# Patient Record
Sex: Female | Born: 1940
Health system: Southern US, Community
[De-identification: ages and names within clinical notes are randomized; demographics above are authoritative.]

## PROBLEM LIST (undated history)

## (undated) DIAGNOSIS — I351 Nonrheumatic aortic (valve) insufficiency: Secondary | ICD-10-CM

## (undated) DIAGNOSIS — N179 Acute kidney failure, unspecified: Secondary | ICD-10-CM

## (undated) DIAGNOSIS — E039 Hypothyroidism, unspecified: Secondary | ICD-10-CM

## (undated) DIAGNOSIS — I517 Cardiomegaly: Secondary | ICD-10-CM

## (undated) DIAGNOSIS — Z8616 Personal history of COVID-19: Secondary | ICD-10-CM

## (undated) DIAGNOSIS — D649 Anemia, unspecified: Secondary | ICD-10-CM

## (undated) DIAGNOSIS — M1990 Unspecified osteoarthritis, unspecified site: Secondary | ICD-10-CM

## (undated) DIAGNOSIS — I4891 Unspecified atrial fibrillation: Secondary | ICD-10-CM

## (undated) DIAGNOSIS — I428 Other cardiomyopathies: Secondary | ICD-10-CM

## (undated) DIAGNOSIS — N186 End stage renal disease: Secondary | ICD-10-CM

## (undated) DIAGNOSIS — I447 Left bundle-branch block, unspecified: Secondary | ICD-10-CM

## (undated) DIAGNOSIS — F419 Anxiety disorder, unspecified: Secondary | ICD-10-CM

## (undated) DIAGNOSIS — I422 Other hypertrophic cardiomyopathy: Secondary | ICD-10-CM

## (undated) DIAGNOSIS — R001 Bradycardia, unspecified: Secondary | ICD-10-CM

## (undated) DIAGNOSIS — Z950 Presence of cardiac pacemaker: Secondary | ICD-10-CM

## (undated) DIAGNOSIS — I10 Essential (primary) hypertension: Secondary | ICD-10-CM

## (undated) DIAGNOSIS — I340 Nonrheumatic mitral (valve) insufficiency: Secondary | ICD-10-CM

## (undated) DIAGNOSIS — S301XXA Contusion of abdominal wall, initial encounter: Secondary | ICD-10-CM

## (undated) DIAGNOSIS — I701 Atherosclerosis of renal artery: Secondary | ICD-10-CM

## (undated) DIAGNOSIS — IMO0002 Reserved for concepts with insufficient information to code with codable children: Secondary | ICD-10-CM

## (undated) DIAGNOSIS — I2510 Atherosclerotic heart disease of native coronary artery without angina pectoris: Secondary | ICD-10-CM

## (undated) DIAGNOSIS — I509 Heart failure, unspecified: Secondary | ICD-10-CM

## (undated) DIAGNOSIS — M3214 Glomerular disease in systemic lupus erythematosus: Secondary | ICD-10-CM

## (undated) DIAGNOSIS — G4733 Obstructive sleep apnea (adult) (pediatric): Secondary | ICD-10-CM

## (undated) DIAGNOSIS — R9430 Abnormal result of cardiovascular function study, unspecified: Secondary | ICD-10-CM

## (undated) HISTORY — DX: Acute kidney failure, unspecified: N17.9

## (undated) HISTORY — DX: Atherosclerotic heart disease of native coronary artery without angina pectoris: I25.10

## (undated) HISTORY — DX: Contusion of abdominal wall, initial encounter: S30.1XXA

## (undated) HISTORY — DX: Atherosclerosis of renal artery: I70.1

## (undated) HISTORY — DX: Other hypertrophic cardiomyopathy: I42.2

## (undated) HISTORY — DX: Anemia, unspecified: D64.9

## (undated) HISTORY — PX: REPLACEMENT TOTAL HIP W/  RESURFACING IMPLANTS: SUR1222

## (undated) HISTORY — PX: OTHER SURGICAL HISTORY: SHX169

## (undated) HISTORY — DX: Nonrheumatic aortic (valve) insufficiency: I35.1

## (undated) HISTORY — DX: Bradycardia, unspecified: R00.1

## (undated) HISTORY — PX: REPLACEMENT TOTAL KNEE: SUR1224

## (undated) HISTORY — DX: Unspecified osteoarthritis, unspecified site: M19.90

## (undated) HISTORY — DX: Obstructive sleep apnea (adult) (pediatric): G47.33

## (undated) HISTORY — DX: Left bundle-branch block, unspecified: I44.7

## (undated) HISTORY — DX: Abnormal result of cardiovascular function study, unspecified: R94.30

## (undated) HISTORY — DX: Cardiomegaly: I51.7

## (undated) HISTORY — DX: Unspecified atrial fibrillation: I48.91

## (undated) HISTORY — PX: ABDOMINAL HYSTERECTOMY: SHX81

## (undated) HISTORY — DX: Reserved for concepts with insufficient information to code with codable children: IMO0002

## (undated) HISTORY — DX: Other cardiomyopathies: I42.8

## (undated) HISTORY — DX: Essential (primary) hypertension: I10

## (undated) HISTORY — DX: Nonrheumatic mitral (valve) insufficiency: I34.0

---

## 2008-02-11 ENCOUNTER — Ambulatory Visit: Payer: Self-pay | Admitting: Cardiology

## 2008-02-16 ENCOUNTER — Ambulatory Visit: Payer: Self-pay | Admitting: Cardiology

## 2008-02-16 ENCOUNTER — Inpatient Hospital Stay (HOSPITAL_COMMUNITY): Admission: RE | Admit: 2008-02-16 | Discharge: 2008-02-23 | Payer: Self-pay | Admitting: Cardiology

## 2008-02-18 ENCOUNTER — Ambulatory Visit: Payer: Self-pay | Admitting: Vascular Surgery

## 2008-02-19 ENCOUNTER — Encounter: Payer: Self-pay | Admitting: Cardiology

## 2008-02-23 ENCOUNTER — Encounter: Payer: Self-pay | Admitting: Cardiology

## 2008-03-11 ENCOUNTER — Ambulatory Visit: Payer: Self-pay | Admitting: Cardiology

## 2008-03-15 ENCOUNTER — Ambulatory Visit: Payer: Self-pay | Admitting: Vascular Surgery

## 2008-03-23 ENCOUNTER — Ambulatory Visit: Payer: Self-pay | Admitting: Vascular Surgery

## 2008-03-23 ENCOUNTER — Ambulatory Visit: Payer: Self-pay | Admitting: Cardiology

## 2008-03-25 ENCOUNTER — Ambulatory Visit: Payer: Self-pay | Admitting: Vascular Surgery

## 2008-03-25 ENCOUNTER — Inpatient Hospital Stay (HOSPITAL_COMMUNITY): Admission: RE | Admit: 2008-03-25 | Discharge: 2008-03-28 | Payer: Self-pay | Admitting: Vascular Surgery

## 2008-04-15 ENCOUNTER — Ambulatory Visit: Payer: Self-pay | Admitting: Cardiology

## 2008-04-19 ENCOUNTER — Ambulatory Visit: Payer: Self-pay | Admitting: Vascular Surgery

## 2008-04-22 ENCOUNTER — Ambulatory Visit: Payer: Self-pay | Admitting: Physician Assistant

## 2008-04-29 ENCOUNTER — Ambulatory Visit: Payer: Self-pay | Admitting: Cardiology

## 2008-04-29 ENCOUNTER — Encounter: Payer: Self-pay | Admitting: Cardiology

## 2008-04-30 ENCOUNTER — Encounter: Payer: Self-pay | Admitting: Cardiology

## 2008-05-03 ENCOUNTER — Encounter: Payer: Self-pay | Admitting: Cardiology

## 2008-05-11 ENCOUNTER — Ambulatory Visit: Payer: Self-pay | Admitting: Cardiology

## 2008-05-11 ENCOUNTER — Encounter: Payer: Self-pay | Admitting: Physician Assistant

## 2008-05-31 ENCOUNTER — Ambulatory Visit: Payer: Self-pay | Admitting: Vascular Surgery

## 2008-06-24 ENCOUNTER — Ambulatory Visit: Payer: Self-pay | Admitting: Cardiology

## 2008-08-17 ENCOUNTER — Ambulatory Visit: Payer: Self-pay | Admitting: Cardiology

## 2008-08-17 ENCOUNTER — Encounter: Payer: Self-pay | Admitting: Physician Assistant

## 2008-08-25 ENCOUNTER — Encounter: Payer: Self-pay | Admitting: Cardiology

## 2008-08-25 ENCOUNTER — Ambulatory Visit: Payer: Self-pay | Admitting: Cardiology

## 2008-11-21 ENCOUNTER — Encounter: Payer: Self-pay | Admitting: Cardiology

## 2008-11-21 DIAGNOSIS — I351 Nonrheumatic aortic (valve) insufficiency: Secondary | ICD-10-CM | POA: Insufficient documentation

## 2008-11-22 ENCOUNTER — Ambulatory Visit: Payer: Self-pay | Admitting: Cardiology

## 2008-12-02 ENCOUNTER — Encounter: Payer: Self-pay | Admitting: Cardiology

## 2008-12-12 ENCOUNTER — Encounter (INDEPENDENT_AMBULATORY_CARE_PROVIDER_SITE_OTHER): Payer: Self-pay | Admitting: *Deleted

## 2008-12-20 ENCOUNTER — Ambulatory Visit: Payer: Self-pay | Admitting: Internal Medicine

## 2009-01-05 ENCOUNTER — Encounter: Payer: Self-pay | Admitting: Cardiology

## 2009-01-06 ENCOUNTER — Ambulatory Visit: Payer: Self-pay | Admitting: Cardiology

## 2009-01-17 ENCOUNTER — Telehealth: Payer: Self-pay | Admitting: Cardiology

## 2009-02-15 ENCOUNTER — Encounter: Payer: Self-pay | Admitting: Cardiology

## 2009-02-16 ENCOUNTER — Encounter: Payer: Self-pay | Admitting: Cardiology

## 2009-02-19 ENCOUNTER — Encounter: Payer: Self-pay | Admitting: Cardiology

## 2009-02-20 ENCOUNTER — Ambulatory Visit: Payer: Self-pay | Admitting: Cardiology

## 2009-03-31 ENCOUNTER — Ambulatory Visit: Payer: Self-pay | Admitting: Cardiology

## 2009-04-07 ENCOUNTER — Telehealth (INDEPENDENT_AMBULATORY_CARE_PROVIDER_SITE_OTHER): Payer: Self-pay | Admitting: *Deleted

## 2009-04-11 ENCOUNTER — Ambulatory Visit: Payer: Self-pay | Admitting: Cardiology

## 2009-04-11 ENCOUNTER — Encounter: Payer: Self-pay | Admitting: Cardiology

## 2009-04-18 ENCOUNTER — Ambulatory Visit: Payer: Self-pay | Admitting: Internal Medicine

## 2009-04-23 ENCOUNTER — Encounter: Payer: Self-pay | Admitting: Cardiology

## 2009-04-25 ENCOUNTER — Encounter: Payer: Self-pay | Admitting: Cardiology

## 2009-05-09 ENCOUNTER — Encounter: Payer: Self-pay | Admitting: Cardiology

## 2009-05-22 ENCOUNTER — Ambulatory Visit: Payer: Self-pay | Admitting: Cardiology

## 2009-09-07 ENCOUNTER — Ambulatory Visit: Payer: Self-pay | Admitting: Cardiology

## 2009-11-02 ENCOUNTER — Ambulatory Visit: Payer: Self-pay | Admitting: Internal Medicine

## 2009-11-15 ENCOUNTER — Telehealth (INDEPENDENT_AMBULATORY_CARE_PROVIDER_SITE_OTHER): Payer: Self-pay | Admitting: *Deleted

## 2009-11-16 ENCOUNTER — Encounter: Payer: Self-pay | Admitting: Internal Medicine

## 2009-12-04 ENCOUNTER — Encounter (INDEPENDENT_AMBULATORY_CARE_PROVIDER_SITE_OTHER): Payer: Self-pay | Admitting: *Deleted

## 2009-12-04 ENCOUNTER — Encounter: Payer: Self-pay | Admitting: Internal Medicine

## 2009-12-05 ENCOUNTER — Telehealth (INDEPENDENT_AMBULATORY_CARE_PROVIDER_SITE_OTHER): Payer: Self-pay | Admitting: *Deleted

## 2009-12-08 ENCOUNTER — Encounter: Payer: Self-pay | Admitting: Cardiology

## 2009-12-08 ENCOUNTER — Ambulatory Visit: Payer: Self-pay | Admitting: Cardiology

## 2009-12-09 ENCOUNTER — Encounter: Payer: Self-pay | Admitting: Cardiology

## 2009-12-11 ENCOUNTER — Telehealth: Payer: Self-pay | Admitting: Cardiology

## 2009-12-12 ENCOUNTER — Encounter: Payer: Self-pay | Admitting: Cardiology

## 2010-01-01 ENCOUNTER — Encounter: Payer: Self-pay | Admitting: Cardiology

## 2010-01-09 ENCOUNTER — Telehealth: Payer: Self-pay | Admitting: Internal Medicine

## 2010-01-17 ENCOUNTER — Encounter: Payer: Self-pay | Admitting: Cardiology

## 2010-01-26 ENCOUNTER — Encounter: Payer: Self-pay | Admitting: Cardiology

## 2010-01-27 ENCOUNTER — Encounter: Payer: Self-pay | Admitting: Cardiology

## 2010-01-28 ENCOUNTER — Encounter: Payer: Self-pay | Admitting: Cardiology

## 2010-01-29 ENCOUNTER — Ambulatory Visit: Payer: Self-pay | Admitting: Cardiology

## 2010-01-31 ENCOUNTER — Encounter: Payer: Self-pay | Admitting: Cardiology

## 2010-02-01 ENCOUNTER — Encounter: Payer: Self-pay | Admitting: Cardiology

## 2010-02-05 ENCOUNTER — Telehealth (INDEPENDENT_AMBULATORY_CARE_PROVIDER_SITE_OTHER): Payer: Self-pay | Admitting: *Deleted

## 2010-02-07 ENCOUNTER — Encounter: Payer: Self-pay | Admitting: Cardiology

## 2010-02-07 ENCOUNTER — Ambulatory Visit: Payer: Self-pay | Admitting: Cardiology

## 2010-02-08 ENCOUNTER — Encounter (INDEPENDENT_AMBULATORY_CARE_PROVIDER_SITE_OTHER): Payer: Self-pay | Admitting: Cardiology

## 2010-02-13 ENCOUNTER — Telehealth: Payer: Self-pay | Admitting: Nurse Practitioner

## 2010-02-14 ENCOUNTER — Ambulatory Visit: Payer: Self-pay | Admitting: Cardiology

## 2010-02-14 ENCOUNTER — Encounter: Payer: Self-pay | Admitting: Cardiology

## 2010-02-14 DIAGNOSIS — E876 Hypokalemia: Secondary | ICD-10-CM | POA: Insufficient documentation

## 2010-02-19 ENCOUNTER — Encounter: Payer: Self-pay | Admitting: Cardiology

## 2010-04-03 NOTE — Miscellaneous (Signed)
  Clinical Lists Changes  Observations: Added new observation of PAST MED HX: nonischemic cardiomyopathy...EF 25%...nondiagnostic MRI December 2009...  /  echo June, 2010 Septal hypertrophy....echo....04/2009....,.decide if family to be screened PLanned ICD / CRT... patient has seen Dr.Klein... this will be placed when state insurance issues are in order LVH.. moderately severe... echo.. June, [id number] systolic heart failure...chronic Hypertension Mitral regurgitation...mild/ moderate...echo June, 2010 Aortic insufficiency mild...echo... June 2 010 80% upper left renal artery stenosis. right groin hematoma/abscess..repaired.. December, 2009 Anemia LBBB Obstructive sleep apnea arthritis of hips and knees...severe... requiring a walker Bradycardia    (05/09/2009 10:12) Added new observation of PRIMARY MD: Jerene Bears, MD (05/09/2009 10:12)       Past History:  Past Medical History: nonischemic cardiomyopathy...EF 25%...nondiagnostic MRI December 2009...  /  echo June, 2010 Septal hypertrophy....echo....04/2009....,.decide if family to be screened PLanned ICD / CRT... patient has seen Dr.Klein... this will be placed when state insurance issues are in order LVH.. moderately severe... echo.. June, [id number] systolic heart failure...chronic Hypertension Mitral regurgitation...mild/ moderate...echo June, 2010 Aortic insufficiency mild...echo... June 2 010 80% upper left renal artery stenosis. right groin hematoma/abscess..repaired.. December, 2009 Anemia LBBB Obstructive sleep apnea arthritis of hips and knees...severe... requiring a walker Bradycardia

## 2010-04-03 NOTE — Assessment & Plan Note (Signed)
Summary: 6wk f/u [place]  Medications Added CALCIUM CARBONATE-VITAMIN D 600-400 MG-UNIT  TABS (CALCIUM CARBONATE-VITAMIN D) once daily      Allergies Added:   Visit Type:  Follow-up Primary Provider:  Jerene Bears, MD  CC:  cardiomyopathy.  History of Present Illness: The patient is seen for followup of cardiomyopathy.  She has seen Dr.Klein.  ICD is recommended.  We are hoping to get this approved soon.  Her echo did show significant LVH.  She also has marked dilatation of the base of her septum.  The exact etiology of her cardiomyopathy is unknown.  Her blood pressure is elevated here today.  She is on multiple medications.  She will be checking it at home to see if we need to push her meds further.  Current Medications (verified): 1)  Lisinopril 40 Mg Tabs (Lisinopril) .... Take 1 Tablet By Mouth Two Times A Day 2)  Isosorbide Mononitrate Cr 60 Mg Xr24h-Tab (Isosorbide Mononitrate) .... Take One Tablet By Mouth Daily 3)  Clonidine Hcl 0.1 Mg Tabs (Clonidine Hcl) .... Take One Tablet By Mouth Three Times A Day 4)  Furosemide 80 Mg Tabs (Furosemide) .... Take 1 Tablet By Mouth Once A Day and As Needed For Swelling 5)  Hydralazine Hcl 50 Mg Tabs (Hydralazine Hcl) .... Take One Tablet By Mouth Three Times A Day 6)  Carvedilol 6.25 Mg Tabs (Carvedilol) .... Take 1 Tablet By Mouth Two Times A Day 7)  Potassium Chloride Crys Cr 20 Meq Cr-Tabs (Potassium Chloride Crys Cr) .... Take One Tablet By Mouth Daily 8)  Spironolactone 25 Mg Tabs (Spironolactone) .... Take One Tablet By Mouth Daily 9)  Aspirin 81 Mg Tbec (Aspirin) .... Take One Tablet By Mouth Daily 10)  Ferrous Sulfate 325 (65 Fe) Mg  Tabs (Ferrous Sulfate) .... Once Daily 11)  Vitamin E 400 Unit Caps (Vitamin E) .... Take 1 Tablet By Mouth Once A Day 12)  Vitamin C 500 Mg  Tabs (Ascorbic Acid) .... Once Daily 13)  Glucosamine-Chondroitin   Caps (Glucosamine-Chondroit-Vit C-Mn) .... Once Daily 14)  Ramipril 2.5 Mg Caps (Ramipril) .... Take  One Capsule By Mouth Daily 15)  Calcium Carbonate-Vitamin D 600-400 Mg-Unit  Tabs (Calcium Carbonate-Vitamin D) .... Once Daily  Allergies (verified): 1)  Keflex  Past History:  Past Medical History: Last updated: 05/09/2009 nonischemic cardiomyopathy...EF 25%...nondiagnostic MRI December 2009...  /  echo June, 2010 Septal hypertrophy....echo....04/2009....,.decide if family to be screened PLanned ICD / CRT... patient has seen Dr.Klein... this will be placed when state insurance issues are in order LVH.. moderately severe... echo.. June, [id number] systolic heart failure...chronic Hypertension Mitral regurgitation...mild/ moderate...echo June, 2010 Aortic insufficiency mild...echo... June 2 010 80% upper left renal artery stenosis. right groin hematoma/abscess..repaired.. December, 2009 Anemia LBBB Obstructive sleep apnea arthritis of hips and knees...severe... requiring a walker Bradycardia    Review of Systems       Patient denies fever, chills, headache, sweats, rash, change in vision, change in hearing, chest pain, cough, shortness of breath, nausea or vomiting, urinary symptoms.  She had an upper respiratory illness recently but she is improved.  All other systems are reviewed and are negative  Vital Signs:  Patient profile:   70 year old female Height:      63 inches Weight:      152 pounds BMI:     27.02 Pulse rate:   60 / minute BP sitting:   152 / 76  (left arm) Cuff size:   regular  Vitals Entered By: Venida Jarvis  Mare Ferrari, [place] (May 22, 2009 3:34 PM)  Physical Exam  General:  patient is stable today. Eyes:  no xanthelasma. Neck:  no jugular venous distention. Lungs:  lungs are clear.  Respiratory effort is nonlabored. Heart:  cardiac exam reveals S1 and S2.  No clicks or significant murmurs. Abdomen:  abdomen is soft. Extremities:  no peripheral edema. Psych:  patient is oriented to person time and place.  Affect is normal.   Impression &  Recommendations:  Problem # 1:  * SEPTAL HYPERTROPHY Patient has significant septal hypertrophy.  The etiology of her cardiomyopathy is unknown.  I have spoken with her about screening family members.  She has no children.  She does have 2 brothers who are in their 79s.  She will send information to the letting them know that she has a hypertrophic cardiomyopathy.  She will recommend to them that they inform their doctor's to see if their  doctors want to proceed with echocardiography.  Problem # 2:  CHRONIC SYSTOLIC HEART FAILURE (123456) CHF is under control at this time.  No change in her meds today.  Problem # 3:  HYPERTENSION (ICD-401.9) I am concerned about her continued hypertension.  She is on many medications.  I will not adjust her meds until we have more information from her home blood pressures.  Patient Instructions: 1)  Your physician wants you to follow-up in: 3 months. You will receive a reminder letter in the mail one-two months in advance. If you don't receive a letter, please call our office to schedule the follow-up appointment. 2)  Your physician recommends that you continue on your current medications as directed. Please refer to the Current Medication list given to you today.

## 2010-04-03 NOTE — Letter (Signed)
Summary: [facility] D/C DR. DHRUV VYAS  [facility] D/C DR. DHRUV VYAS   Imported By: Delfino Lovett 01/26/2010 10:17:07  _____________________________________________________________________  External Attachment:    Type:   Image     Comment:   External Document

## 2010-04-03 NOTE — Assessment & Plan Note (Signed)
Summary: eph-post [place] [facility] 10/31  Medications Added CLONIDINE HCL 0.2 MG TABS (CLONIDINE HCL) Take 1 tablet by mouth three times a day FUROSEMIDE 40 MG TABS (FUROSEMIDE) Take 1 tablet by mouth two times a day FUROSEMIDE 40 MG TABS (FUROSEMIDE) Take 2 tablets every morning and 1 tablet every evening as directed CARVEDILOL 3.125 MG TABS (CARVEDILOL) Take 1 tablet by mouth two times a day POTASSIUM CHLORIDE CRYS CR 20 MEQ CR-TABS (POTASSIUM CHLORIDE CRYS CR) Take one tablet by mouth two times a day * IRON 65MG  Take 1 tablet by mouth once a day      Allergies Added:   Visit Type:  Follow-up Primary Provider:  Jerene Bears, MD  CC:  CHF.  History of Present Illness: The patient is seen for cardiology followup.  We have been working diligently to finalize the plans for ICD placement.  The patient was hospitalized at [facility] December 08, 2009.  She had diarrhea.  It is my understanding that she was anemic and that ultimately a colonoscopy was done.  Her anemia will need other workup.  Because of her diarrhea she was dehydrated.  As her renal function improved she was kept off her diuretics.  All of these decisions were appropriate but unfortunately the patient later began to gain fluid weight.  She in fact was admitted to [facility] with fluid overload at the end of October and recently in November.  At the last admission she was intubated briefly.  She is stabilizing.  She continues to diuresis home.  The patient's ejection fraction in late October and [place] was 30%.  This is very unusual as her ejection fraction was 55-60% on December 08, 2009.  There was no myocardial infarction in the interim.  Also the patient was markedly hypertensive when she was admitted with congestive heart failure and [place].  There is a history of renal artery stenosis.  However it affects only the upper artery of the left kidney.  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  Preventive  Screening-Counseling & Management  Alcohol-Tobacco     Smoking Status: quit     Year Started: 20  yrs     Year Quit: 1987  Current Medications (verified): 1)  Isosorbide Mononitrate Cr 60 Mg Xr24h-Tab (Isosorbide Mononitrate) .... Take One Tablet By Mouth Daily 2)  Clonidine Hcl 0.2 Mg Tabs (Clonidine Hcl) .... Take 1 Tablet By Mouth Three Times A Day 3)  Furosemide 40 Mg Tabs (Furosemide) .... Take 1 Tablet By Mouth Two Times A Day 4)  Hydralazine Hcl 50 Mg Tabs (Hydralazine Hcl) .... Take One Tablet By Mouth Three Times A Day 5)  Carvedilol 3.125 Mg Tabs (Carvedilol) .... Take 1 Tablet By Mouth Two Times A Day 6)  Potassium Chloride Crys Cr 20 Meq Cr-Tabs (Potassium Chloride Crys Cr) .... Take One Tablet By Mouth Daily 7)  Aspirin 81 Mg Tbec (Aspirin) .... Take One Tablet By Mouth Daily 8)  Iron 65mg  .... Take 1 Tablet By Mouth Once A Day 9)  Vitamin E 400 Unit Caps (Vitamin E) .... Take 1 Tablet By Mouth Once A Day 10)  Vitamin C 500 Mg  Tabs (Ascorbic Acid) .... Once Daily 11)  Glucosamine-Chondroitin 750-600 Mg Tabs (Glucosamine-Chondroitin) .... Take 1 Tablet By Mouth Two Times A Day 12)  Ramipril 2.5 Mg Caps (Ramipril) .... Take One Capsule By Mouth Daily 13)  Calcium Carbonate-Vitamin D 600-400 Mg-Unit  Tabs (Calcium Carbonate-Vitamin D) .... Take 1 Tablet By Mouth Two Times A Day 14)  Alendronate Sodium 70 Mg Tabs (Alendronate Sodium) .... Take One By Mouth Weekly  Allergies (verified): 1)  Keflex  Past History:  Past Medical History: .EF 25%...nondiagnostic MRI December 2009...  /  echo June, 2010  /   echo... December 08, 2009.... ejection fraction improved... EF 60% /  EF 30%... echo.... [place]... December 28, 2009 with CHF Cardiomyopathy nonischemic.... EF improved October, 2011 Septal hypertrophy....echo....04/2009....patient encouraged the family to be screened elsewhere PLanned ICD / CRT... patient has seen Dr.Klein..EF improved October, 2011 LVH.. moderately  severe... echo.. June, 2010  /  EF improved October, 2011.... cancel plans for ICD systolic heart failure...chronic Hypertension Mitral regurgitation...mild/ moderate...echo June, 2010  /   echo.. October, 2011.... mitral regurgitation improved Aortic insufficiency mild...echo... June 2 010 80% upper left renal artery stenosis. right groin hematoma/abscess..repaired.. December, 2009 Anemia   further workup needed... October, 2011 LBBB Obstructive sleep apnea arthritis of hips and knees...severe... requiring a walker Bradycardia Acute renal failure   hospitalized... December 08, 2009... improved with hydration in the hospital  ??? atrial fibrillation during hospitalization October, 2011 ???   SH/Risk Factors reviewed for relevance  Review of Systems       Patient denies fever, chills, headache, sweats, rash, change in vision, change in hearing, chest pain, cough, nausea vomiting, urinary symptoms.  All other systems are reviewed and are negative.  Vital Signs:  Patient profile:   70 year old female Height:      63 inches Weight:      171.25 pounds Pulse rate:   64 / minute BP sitting:   147 / 76  (left arm) Cuff size:   regular  Vitals Entered By: Lovina Reach, LPN (November 28, 624THL 1:18 PM) CC: CHF Is Patient Diabetic? No Comments POST HOSP.   Physical Exam  General:  patient is stable today. Head:  head is atraumatic. Eyes:  patient has puffiness in her eyelids.  In her case this is compatible with increased volume. Neck:  no drug venous distention. Chest Wall:  no chest wall tenderness. Lungs:  lungs are clear respiratory effort is nonlabored. Heart:  cardiac exam reveals S1-S2.  There no clicks or significant murmurs. Abdomen:  abdomen is soft.  She says it does feel a little full. Msk:  no musculoskeletal deformities. Extremities:  trace peripheral edema. Skin:  no skin rashes. Psych:  patient is oriented to person time and place.  Affect is normal.   Impression &  Recommendations:  Problem # 1:  * ACUTE RENAL FAILURE  OCTOBER, 2011 This occurred when the patient was dehydrated in early October.  It is resolved.  Chemistry will be checked.  Problem # 2:  CARDIOMYOPATHY, PRIMARY, EF 25% (ICD-425.4) The patient's changing ejection fraction has been very confusing.  We know that her EF was at least 55% on December 08 2009.  It is reported as 30% on December 28, 2009.  I will repeat review our echoes.  I do not have access to the echo from [place] in October 27, 20113.   Problem # 3:  CHRONIC SYSTOLIC HEART FAILURE (123456) The patient recently became dehydrated in early October.  She had renal dysfunction with this.  She was rehydrated and later became volume overloaded.  She has now had 2 episodes of congestive heart failure with volume overload.  Her dry weight at home had been as low as 153 pounds.  It was as high as 175 pounds recently.  Currently it is in the range of 165.  We will  continue to watch her diuresis carefully until she gets into the range of 158 and 159.  Potassium will be watched carefully.  She will receive 80 mg a day of Lasix in the morning and take an extra 40 mg later in the day if her weight is not decreasing.  She will receive extra 40 mEq of potassium today.  Chemistry will be checked today and this is made about further potassium dosing based on this lab.  In the meantime she will take 20 mEq of potassium b.i.d.  Problem # 4:  RENAL ARTERY STENOSIS (ICD-440.1) The patient has renal artery stenosis but it is 2 one of 2 arteries to her left kidney.  It seems unlikely that she is having flash pulmonary edema based on renal artery stenosis.  It is more likely that these episodes are from true total body volume overload.  Problem # 5:  HYPERTENSION (ICD-401.9) Blood pressure is controlled today.  No change in therapy.  Problem # 6:  * PLAN FOR ICD We were very close to proceeding with ICD placement.  She had her episode of  dehydration and renal dysfunction.  At that time it appeared that her LV function was good.  Combination of these events led to cancellation of her ICD placement.  His whole issue will be rereviewed as she stabilizes.  Problem # 7:  * ??  ATRIAL FIBRILLATION  ?? There is question that the patient had some atrial fibrillation during her hospitalization in October, 2011.  There is also question that she went home on Coumadin.  I cannot document this.  We will reassess her rhythm at the next visit and make further decisions.  Other Orders: T-Basic Metabolic Panel ([id number])  Patient Instructions: 1)  Follow up with Dr. Ron Parker on Wed, Feb 07, 2010 at 2pm 2)  Take an additional 40mg  of Lasix (furosemide) today. Then take 2 tablets (80mg ) every morning and 1 tablet (40mg ) every evening (if needed). 3)  Take an extra 40 mEq (2 tablets) of Potassium today then take 20 mEq (1 tablet) two times a day. 4)  Your physician recommends that you go to the [facility] for lab work: DO TODAY. Prescriptions: POTASSIUM CHLORIDE CRYS CR 20 MEQ CR-TABS (POTASSIUM CHLORIDE CRYS CR) Take one tablet by mouth two times a day  #60 x 6   Entered by:   Gurney Maxin, RN, BSN   Authorized by:   Lorenza Evangelist, MD, [facility]   Signed by:   Gurney Maxin, RN, BSN on 01/29/2010   Method used:   Electronically to        [facility]. [address] (retail)       [address] [place]  [address]       Ph: [phone] or [phone]       Fax: [phone]   RxID:   [id number] FUROSEMIDE 40 MG TABS (FUROSEMIDE) Take 2 tablets every morning and 1 tablet every evening as directed  #90 x 6   Entered by:   Gurney Maxin, RN, BSN   Authorized by:   Lorenza Evangelist, MD, [facility]   Signed by:   Gurney Maxin, RN, BSN on 01/29/2010   Method used:   Electronically to        [facility]. [address]* (retail)       [address] [place]  [address]       Ph: [phone] or  [phone]        Fax: [phone]   RxIDLL:[id number]

## 2010-04-03 NOTE — Letter (Signed)
Summary: Discharge [facility]  Discharge Summary/ [place] By: Bartholomew Boards 02/05/2010 11:17:01  _____________________________________________________________________  External Attachment:    Type:   Image     Comment:   External Document

## 2010-04-03 NOTE — Miscellaneous (Signed)
  Clinical Lists Changes  Problems: Added new problem of * SEPTAL HYPERTROPHY Observations: Added new observation of PAST MED HX: nonischemic cardiomyopathy...EF 25%...nondiagnostic MRI December 2009...  /  echo June, 2010 Septal hypertrophy....echo..decide if family to be screened PLanned ICD / CRT... patient has seen Dr.Klein... this will be placed when state insurance issues are in order LVH.. moderately severe... echo.. June, [id number] systolic heart failure...chronic Hypertension Mitral regurgitation...mild/ moderate...echo June, 2010 Aortic insufficiency mild...echo... June 2 010 80% upper left renal artery stenosis. right groin hematoma/abscess..repaired.. December, 2009 Anemia LBBB Obstructive sleep apnea arthritis of hips and knees...severe... requiring a walker Bradycardia    (05/09/2009 10:09) Added new observation of PRIMARY MD: Jerene Bears, MD (05/09/2009 10:09)       Past History:  Past Medical History: nonischemic cardiomyopathy...EF 25%...nondiagnostic MRI December 2009...  /  echo June, 2010 Septal hypertrophy....echo..decide if family to be screened PLanned ICD / CRT... patient has seen Dr.Klein... this will be placed when state insurance issues are in order LVH.. moderately severe... echo.. June, [id number] systolic heart failure...chronic Hypertension Mitral regurgitation...mild/ moderate...echo June, 2010 Aortic insufficiency mild...echo... June 2 010 80% upper left renal artery stenosis. right groin hematoma/abscess..repaired.. December, 2009 Anemia LBBB Obstructive sleep apnea arthritis of hips and knees...severe... requiring a walker Bradycardia

## 2010-04-03 NOTE — Assessment & Plan Note (Signed)
Summary: 1 mo fu -srs  Medications Added CARVEDILOL 6.25 MG TABS (CARVEDILOL) Take 1 tablet by mouth two times a day        Visit Type:  Follow-up Primary Provider:  Jerene Bears, MD  CC:  cardiac myopathy.  History of Present Illness: The patient is seen for followup of cardiomyopathy.  I saw her last February 20, 2009.  She is feeling well.  She has been extremely motivated and she has lost more than 100 pounds.  She looks fantastic.  She's not having chest pain or shortness of breath.  She is taking all medications as recommended for her cardiomyopathy.  She has significant hypertension.  She is on many medications.  She is on an unusual combination of 2 ACE inhibitors.  This will have to be reviewed but are not inclined to change this today.  First we need chemistry data to be sure that her renal function and potassium are stable.  As outlined in my prior notes we are planning for an ICD placement.  She will see Dr. Caryl Comes April 18, 2008.  Since it has been since June, 2010, we will obtain another 2-D echo to be sure that there has not been an unexpected change in her LV function since that time.    Preventive Screening-Counseling & Management  Alcohol-Tobacco     Smoking Status: quit > 6 months     Year Started: 1962     Year Quit: 1987  Current Medications (verified): 1)  Lisinopril 40 Mg Tabs (Lisinopril) .... Take 1 Tablet By Mouth Two Times A Day 2)  Isosorbide Mononitrate Cr 60 Mg Xr24h-Tab (Isosorbide Mononitrate) .... Take One Tablet By Mouth Daily 3)  Clonidine Hcl 0.1 Mg Tabs (Clonidine Hcl) .... Take One Tablet By Mouth Three Times A Day 4)  Furosemide 80 Mg Tabs (Furosemide) .... Take 1 Tablet By Mouth Once A Day and As Needed For Swelling 5)  Hydralazine Hcl 50 Mg Tabs (Hydralazine Hcl) .... Take One Tablet By Mouth Three Times A Day 6)  Carvedilol 6.25 Mg Tabs (Carvedilol) .... Take 1 Tablet By Mouth Two Times A Day 7)  Potassium Chloride Crys Cr 20 Meq Cr-Tabs  (Potassium Chloride Crys Cr) .... Take One Tablet By Mouth Daily 8)  Spironolactone 25 Mg Tabs (Spironolactone) .... Take One Tablet By Mouth Daily 9)  Aspirin 81 Mg Tbec (Aspirin) .... Take One Tablet By Mouth Daily 10)  Ferrous Sulfate 325 (65 Fe) Mg  Tabs (Ferrous Sulfate) .... Once Daily 11)  Vitamin E 400 Unit Caps (Vitamin E) .... Take 1 Tablet By Mouth Once A Day 12)  Vitamin C 500 Mg  Tabs (Ascorbic Acid) .... Once Daily 13)  Glucosamine-Chondroitin   Caps (Glucosamine-Chondroit-Vit C-Mn) .... Once Daily 14)  Ramipril 2.5 Mg Caps (Ramipril) .... Take One Capsule By Mouth Daily  Allergies: 1)  Keflex  Comments:  Nurse/Medical Assistant: The patient's medications were reviewed with the patient and were updated in the Medication List. Pt brought a list of medications to office visit.  Gurney Maxin, RN, BSN (March 31, 2009 2:27 PM)  Past History:  Past Medical History: Last updated: 02/19/2009 nonischemic cardiomyopathy...EF 25%...nondiagnostic MRI December 2009...  /  echo June, 2010 PLanned ICD / CRT... patient has seen Dr.Klein... this will be placed when state insurance issues are in order LVH.. moderately severe... echo.. June, [id number] systolic heart failure...chronic Hypertension Mitral regurgitation...mild/ moderate...echo June, 2010 Aortic insufficiency mild...echo... June 2 010 80% upper left renal artery stenosis. right groin hematoma/abscess..repaired.[name]  December, 2009 Anemia LBBB Obstructive sleep apnea arthritis of hips and knees...severe... requiring a walker Bradycardia    Review of Systems       Patient denies fever, chills, headache, sweats, rash, change in vision, change in hearing, chest pain, cough, nausea vomiting, urinary symptoms,.  All of the systems are reviewed and are negative.  Vital Signs:  Patient profile:   70 year old female Height:      63 inches Weight:      154.50 pounds BMI:     27.47 Pulse rate:   59 / minute BP sitting:   181  / 80  (left arm) Cuff size:   regular  Vitals Entered By: Gurney Maxin, RN, BSN (March 31, 2009 2:22 PM)  Nutrition Counseling: Patient's BMI is greater than 25 and therefore counseled on weight management options. CC: cardiac myopathy Comments Follow up visit.   Physical Exam  General:  patient is quite stable in general. Head:  head is atraumatic. Eyes:  no xanthelasma. Neck:  no jugular venous dimension. Chest Wall:  no chest wall tenderness. Lungs:  respiratory effort is nonlabored.  Lungs are clear. Heart:  cardiac exam reveals S1 and S2.  No clicks or significant murmurs. Abdomen:  abdomen soft. Msk:  no musculoskeletal deformities. Extremities:  no peripheral edema. Skin:  no skin rashes. Psych:  patient is oriented to person time and place.  Affect is normal.   Impression & Recommendations:  Problem # 1:  BRADYCARDIA (ICD-427.89)  Her updated medication list for this problem includes:    Lisinopril 40 Mg Tabs (Lisinopril) .[name]... Take 1 tablet by mouth two times a day    Isosorbide Mononitrate Cr 60 Mg Xr24h-tab (Isosorbide mononitrate) .[name]... Take one tablet by mouth daily    Carvedilol 6.25 Mg Tabs (Carvedilol) .[name]... Take 1 tablet by mouth two times a day    Aspirin 81 Mg Tbec (Aspirin) .[name]... Take one tablet by mouth daily    Ramipril 2.5 Mg Caps (Ramipril) .[name]... Take one capsule by mouth daily  On her current dose of carvedilol heart rate is stable at 57.  We cannot titrate her meds any higher.  Problem # 2:  CARDIOMYOPATHY, PRIMARY, EF 25% (ICD-425.4)  Her updated medication list for this problem includes:    Lisinopril 40 Mg Tabs (Lisinopril) .[name]... Take 1 tablet by mouth two times a day    Isosorbide Mononitrate Cr 60 Mg Xr24h-tab (Isosorbide mononitrate) .[name]... Take one tablet by mouth daily    Furosemide 80 Mg Tabs (Furosemide) .[name]... Take 1 tablet by mouth once a day and as needed for swelling    Carvedilol 6.25 Mg Tabs (Carvedilol) .[name]... Take 1 tablet by  mouth two times a day    Spironolactone 25 Mg Tabs (Spironolactone) .[name]... Take one tablet by mouth daily    Aspirin 81 Mg Tbec (Aspirin) .[name]... Take one tablet by mouth daily    Ramipril 2.5 Mg Caps (Ramipril) .[name]... Take one capsule by mouth daily  we will obtain a followup 2-D echo to be sure that there has been no significant change since June, 2010.  She will see Dr. Caryl Comes soon and he will use this information to finalize the plan is to whether or not an ICD can be placed.  Orders: 2-D Echocardiogram (2D Echo)  Problem # 3:  HYPERTENSION (ICD-401.9)  Her updated medication list for this problem includes:    Lisinopril 40 Mg Tabs (Lisinopril) .[name]... Take 1 tablet by mouth two times a day  Clonidine Hcl 0.1 Mg Tabs (Clonidine hcl) .[name]... Take one tablet by mouth three times a day    Furosemide 80 Mg Tabs (Furosemide) .[name]... Take 1 tablet by mouth once a day and as needed for swelling    Hydralazine Hcl 50 Mg Tabs (Hydralazine hcl) .[name]... Take one tablet by mouth three times a day    Carvedilol 6.25 Mg Tabs (Carvedilol) .[name]... Take 1 tablet by mouth two times a day    Spironolactone 25 Mg Tabs (Spironolactone) .[name]... Take one tablet by mouth daily    Aspirin 81 Mg Tbec (Aspirin) .[name]... Take one tablet by mouth daily    Ramipril 2.5 Mg Caps (Ramipril) .[name]... Take one capsule by mouth dailyher  I would like for her blood pressure to be better controlled.  I am hesitant to change medicines further before she has her ICD placed.  She is on many medications.  Also we will have her obtain her blood pressure home multiple times during the day for several days to gather more data.  Orders: T-Basic Metabolic Panel (99991111)  Problem # 4:  RENAL ARTERY STENOSIS (ICD-440.1)  The patient does have renal artery stenosis.  However it is the upper left renal artery.  She has a second artery to this kidney.  Therefore she does not have functional renal artery stenosis that would require  intervention.  Orders: T-Basic Metabolic Panel (99991111)  Patient Instructions: 1)  Your physician has requested that you have an echocardiogram.  Echocardiography is a painless test that uses sound waves to create images of your heart. It provides your doctor with information about the size and shape of your heart and how well your heart's chambers and valves are working.  This procedure takes approximately one hour. There are no restrictions for this procedure. 2)  Your physician recommends that you return for lab work [id number] AT [place] [facility]. 3)  Your physician recommends that you schedule a follow-up appointment in: 05/19/09 @2 :45PM with  Dr. Ron Parker. 4)  Please monitor your bloodpressures at home twice daily for the next 3-5 days and send [place] your results. Prescriptions: CARVEDILOL 6.25 MG TABS (CARVEDILOL) Take 1 tablet by mouth two times a day  #60 x 6   Entered by:   Georgina Peer   Authorized by:   Lorenza Evangelist, MD, [facility]   Signed by:   Georgina Peer on 03/31/2009   Method used:   Electronically to        [facility] [address] (retail)       [address] [place]  [address]       Ph: [phone] or [phone]       Fax: [phone]   RxID:   [id number]

## 2010-04-03 NOTE — Assessment & Plan Note (Signed)
Summary: 1 MO F/U PER 9/21 OV-JM  Medications Added LISINOPRIL 40 MG TABS (LISINOPRIL) Take 1 tablet by mouth two times a day FUROSEMIDE 80 MG TABS (FUROSEMIDE) Take 1 1/2  tablet by mouth daily as directed. VITAMIN E 400 UNIT CAPS (VITAMIN E) Take 1 tablet by mouth once a day      Allergies Added:   Visit Type:  Follow-up Primary Provider:  Woody Seller  CC:  CHF.  History of Present Illness: The patient is seen for followup of CHF.  I had referred her for consideration of ICD placement.  She saw Dr.Klein.  He agreed that ICD was indicated.  He also felt that CRT therapy was indicated.  The patient and Dr. Caryl Comes are planning this intervention at the appropriate time.  Patient is doing very well.  At times she takes next Lasix if her weight changes.  She's managing this very well.  She's not having any chest pain.  She's not had any syncope.  She does not that at times her systolic blood pressure is in the 150 range.  She knows that this is higher than we want.  Preventive Screening-Counseling & Management  Alcohol-Tobacco     Smoking Status: quit     Year Quit: 1987  Current Medications (verified): 1)  Lisinopril 40 Mg Tabs (Lisinopril) .... Take 1 Tablet By Mouth Once A Day 2)  Isosorbide Mononitrate Cr 60 Mg Xr24h-Tab (Isosorbide Mononitrate) .... Take One Tablet By Mouth Daily 3)  Clonidine Hcl 0.1 Mg Tabs (Clonidine Hcl) .... Take One Tablet By Mouth Three Times A Day 4)  Furosemide 80 Mg Tabs (Furosemide) .... Take One Tablet By Mouth Daily. 5)  Hydralazine Hcl 50 Mg Tabs (Hydralazine Hcl) .... Take One Tablet By Mouth Three Times A Day 6)  Carvedilol 12.5 Mg Tabs (Carvedilol) .... Take One Tablet By Mouth Twice A Day 7)  Potassium Chloride Crys Cr 20 Meq Cr-Tabs (Potassium Chloride Crys Cr) .... Take One Tablet By Mouth Daily 8)  Spironolactone 25 Mg Tabs (Spironolactone) .... Take One Tablet By Mouth Daily 9)  Aspirin 81 Mg Tbec (Aspirin) .... Take One Tablet By Mouth Daily 10)   Ferrous Sulfate 325 (65 Fe) Mg  Tabs (Ferrous Sulfate) .... Once Daily 11)  Vitamin E 400 Unit Caps (Vitamin E) .... Take 1 Tablet By Mouth Once A Day 12)  Vitamin C 500 Mg  Tabs (Ascorbic Acid) .... Once Daily 13)  Glucosamine-Chondroitin   Caps (Glucosamine-Chondroit-Vit C-Mn) .... Once Daily 14)  Ramipril 2.5 Mg Caps (Ramipril) .... Take One Capsule By Mouth Daily  Allergies (verified): 1)  ! Keflex  Past History:  Past Medical History: Reviewed history from 01/05/2009 and no changes required. nonischemic cardiomyopathy...EF 25%...nondiagnostic MRI December 2009...  /  echo June, 2010 ICD / CRT... patient has seen Dr.Klein... this will be placed when state insurance issues are in order LVH.. moderately severe... echo.. June, [id number] Chronic systolic heart failure Hypertension Mitral regurgitation...mild/ moderate...echo June, 2010 Aortic insufficiency mild...echo... June 2 010 80% upper left renal artery stenosis. right groin hematoma/abscess..repaired.. December, 2009 Anemia chronic LBBB Obstructive sleep apnea severe arthritis of hips and knees requiring a walker    Social History: Smoking Status:  quit  Review of Systems       Patient denies fever, chills, sweats, headache, rash, change in vision, change in hearing, chest pain, shortness of breath, cough, nausea vomiting, urinary symptoms, musculoskeletal problems, psychological problems.  All other systems are reviewed and are negative.  Vital Signs:  Patient  profile:   70 year old female Height:      64 inches Weight:      156 pounds O2 Sat:      97 % Pulse rate:   56 / minute BP sitting:   172 / 85  (right arm) Cuff size:   regular  Vitals Entered By: Georgina Peer (January 06, 2009 2:31 PM) CC: CHF   Physical Exam  General:  The patient is stable today.  She is here with her walker. Head:  head is normocephalic and atraumatic. Eyes:  no xanthelasma. Neck:  no jugular venous extension.  No carotid  bruits. Chest Wall:  chest wall is nontender. Lungs:  lungs are clear.  Respiratory effort is nonlabored. Heart:  cardiac exam reveals S1-S2.  There are no clicks or significant murmurs. Abdomen:  abdomen is soft. Msk:  no musculoskeletal deformities. Extremities:  trace peripheral edema. Neurologic:  neurologic is grossly intact. Skin:  no skin rashes. Psych:  patient is oriented to person time and place.  Affect is normal.  She continues to be very motivated working towards getting to her [employer] weight.   Impression & Recommendations:  Problem # 1:  CARDIOMYOPATHY, PRIMARY, EF 25% (ICD-425.4)  Her updated medication list for this problem includes:    Lisinopril 40 Mg Tabs (Lisinopril) .[name]... Take 1 tablet by mouth two times a day    Isosorbide Mononitrate Cr 60 Mg Xr24h-tab (Isosorbide mononitrate) .[name]... Take one tablet by mouth daily    Furosemide 80 Mg Tabs (Furosemide) .[name]... Take 1 1/2  tablet by mouth daily as directed.    Carvedilol 12.5 Mg Tabs (Carvedilol) .[name]... Take one tablet by mouth twice a day    Spironolactone 25 Mg Tabs (Spironolactone) .[name]... Take one tablet by mouth daily    Aspirin 81 Mg Tbec (Aspirin) .[name]... Take one tablet by mouth daily    Ramipril 2.5 Mg Caps (Ramipril) .[name]... Take one capsule by mouth daily  The patient is on appropriate medications for her LV dysfunction.  I would like to push her carvedilol higher but her resting rate is 56.  No change for this reason but we will push her lisinopril dose as her blood pressure is not adequately controlled.  Problem # 2:  CHRONIC SYSTOLIC HEART FAILURE (123456)  Her updated medication list for this problem includes:    Lisinopril 40 Mg Tabs (Lisinopril) .[name]... Take 1 tablet by mouth two times a day    Isosorbide Mononitrate Cr 60 Mg Xr24h-tab (Isosorbide mononitrate) .[name]... Take one tablet by mouth daily    Furosemide 80 Mg Tabs (Furosemide) .[name]... Take 1 1/2  tablet by mouth daily as directed.     Carvedilol 12.5 Mg Tabs (Carvedilol) .[name]... Take one tablet by mouth twice a day    Spironolactone 25 Mg Tabs (Spironolactone) .[name]... Take one tablet by mouth daily    Aspirin 81 Mg Tbec (Aspirin) .[name]... Take one tablet by mouth daily    Ramipril 2.5 Mg Caps (Ramipril) .[name]... Take one capsule by mouth daily   The patient's volume status and heart failure status are stable.  She adjust her diuretic at home and she is doing a good job with this.  We will give her a larger prescription so that she can have the appropriate amount of Lasix.  Problem # 3:  SINUS BRADYCARDIA (ICD-427.81)  Her updated medication list for this problem includes:    Lisinopril 40 Mg Tabs (Lisinopril) .[name]... Take 1 tablet by mouth two times a  day    Isosorbide Mononitrate Cr 60 Mg Xr24h-tab (Isosorbide mononitrate) .[name]... Take one tablet by mouth daily    Carvedilol 12.5 Mg Tabs (Carvedilol) .[name]... Take one tablet by mouth twice a day    Aspirin 81 Mg Tbec (Aspirin) .[name]... Take one tablet by mouth daily    Ramipril 2.5 Mg Caps (Ramipril) .[name]... Take one capsule by mouth daily  Her heart rate is controlled currently.  We will not push her carvedilol dose any higher.  Problem # 4:  MITRAL REGURGITATION (ICD-396.3) Mitral regurgitation is stable.  No further workup is needed.  Problem # 5:  HYPERTENSION (ICD-401.9)  Her updated medication list for this problem includes:    Lisinopril 40 Mg Tabs (Lisinopril) .[name]... Take 1 tablet by mouth two times a day    Clonidine Hcl 0.1 Mg Tabs (Clonidine hcl) .[name]... Take one tablet by mouth three times a day    Furosemide 80 Mg Tabs (Furosemide) .[name]... Take 1 1/2  tablet by mouth daily as directed.    Hydralazine Hcl 50 Mg Tabs (Hydralazine hcl) .[name]... Take one tablet by mouth three times a day    Carvedilol 12.5 Mg Tabs (Carvedilol) .[name]... Take one tablet by mouth twice a day    Spironolactone 25 Mg Tabs (Spironolactone) .[name]... Take one tablet by mouth daily    Aspirin 81 Mg Tbec (Aspirin)  .[name]... Take one tablet by mouth daily    Ramipril 2.5 Mg Caps (Ramipril) .[name]... Take one capsule by mouth daily  Patient has severe hypertension.  Many of the medicines that we use for her blood pressure are indicated for her heart failure.  Lisinopril can be pushed to 80 mg a day.  Therefore her dose will be increased to 40 p.o. b.i.d. as of today.  I will see her back for early followup.  Patient Instructions: 1)  Your physician recommends that you schedule a follow-up appointment in: 6 weeks 2)  Increase Lisinopril to 40mg  two times a day  Prescriptions: FUROSEMIDE 80 MG TABS (FUROSEMIDE) Take 1 1/2  tablet by mouth daily as directed.  #45 x 6   Entered by:   Gurney Maxin, RN, BSN   Authorized by:   Lorenza Evangelist, MD, [facility]   Signed by:   Gurney Maxin, RN, BSN on 01/06/2009   Method used:   Electronically to        [facility]. [address] (retail)       [address] [place]  [address]       Ph: [phone] or [phone]       Fax: [phone]   RxID:   [id number] LISINOPRIL 40 MG TABS (LISINOPRIL) Take 1 tablet by mouth two times a day  #60 x 6   Entered by:   Gurney Maxin, RN, BSN   Authorized by:   Lorenza Evangelist, MD, [facility]   Signed by:   Gurney Maxin, RN, BSN on 01/06/2009   Method used:   Electronically to        [facility]. [address] (retail)       [address] [place]  [address]       Ph: [phone] or [phone]       Fax: [phone]   RxID:   [id number]

## 2010-04-03 NOTE — Miscellaneous (Signed)
  Clinical Lists Changes  Problems: Added new problem of SLEEP APNEA, OBSTRUCTIVE (ICD-327.23) Removed problem of OBSTRUCTIVE SLEEP APNEA (ICD-327.23) Added new problem of BRADYCARDIA (ICD-427.89) Observations: Added new observation of PAST MED HX: nonischemic cardiomyopathy...EF 25%...nondiagnostic MRI December 2009...  /  echo June, 2010 PLanned ICD / CRT... patient has seen Dr.Klein... this will be placed when state insurance issues are in order LVH.. moderately severe... echo.. June, [id number] systolic heart failure...chronic Hypertension Mitral regurgitation...mild/ moderate...echo June, 2010 Aortic insufficiency mild...echo... June 2 010 80% upper left renal artery stenosis. right groin hematoma/abscess..repaired.. December, 2009 Anemia LBBB Obstructive sleep apnea arthritis of hips and knees...severe... requiring a walker Bradycardia    (02/19/2009 18:02) Added new observation of PRIMARY MD: Vyas (02/19/2009 18:02)       Past History:  Past Medical History: nonischemic cardiomyopathy...EF 25%...nondiagnostic MRI December 2009...  /  echo June, 2010 PLanned ICD / CRT... patient has seen Dr.Klein... this will be placed when state insurance issues are in order LVH.. moderately severe... echo.. June, [id number] systolic heart failure...chronic Hypertension Mitral regurgitation...mild/ moderate...echo June, 2010 Aortic insufficiency mild...echo... June 2 010 80% upper left renal artery stenosis. right groin hematoma/abscess..repaired.. December, 2009 Anemia LBBB Obstructive sleep apnea arthritis of hips and knees...severe... requiring a walker Bradycardia

## 2010-04-03 NOTE — Progress Notes (Signed)
   Phone Note Outgoing Call   Call placed by: Joan Flores Call placed to: Patient Details for Reason: Lab results for procedure Summary of Call: left msg to adv pt that lab results not up to par and will have to cancel 10/7 procedure and will discuss further tomorrow. Joan Flores RN, BSN  Follow-up for Phone Call        spoke w/pt and will set up appt w/Dr. Woody Seller and fax blood results to him. She confirmed understanding of situation and that ICD implant to be rescheduled at later time.  Follow-up by: Joan Flores RN,  December 06, 2009 9:01 AM  Additional Follow-up for Phone Call Additional follow up Details #1::        adv pt of appt 10/7 at 11:30 and she expressed understanding.  Additional Follow-up by: Joan Flores RN,  December 06, 2009 9:48 AM

## 2010-04-03 NOTE — Progress Notes (Signed)
Summary: D/C [place] 12/1   Phone Note Call from Patient Call back at [facility] Phone [phone]   Summary of Call: Spoke with pt today. She states she was d/c'd from [place] on 12/1. She states weight is down about 7lbs and she is doing better now. She is taking Lasix 80mg  every morning and 40mg  every evening.  Records requested from [facility]. Initial call taken by: Gurney Maxin, RN, BSN,  February 05, 2010 10:16 AM     Appended Document: D/C [place] 12/1 thanks

## 2010-04-03 NOTE — Miscellaneous (Signed)
  Clinical Lists Changes  Observations: Added new observation of PAST MED HX: nonischemic cardiomyopathy...EF 25%...nondiagnostic MRI December 2009...  /  echo June, 2010 ICD / CRT... patient has seen Dr.Klein... this will be placed when state insurance issues are in order LVH.. moderately severe... echo.. June, [id number] Chronic systolic heart failure Hypertension Mitral regurgitation...mild/ moderate...echo June, 2010 Aortic insufficiency mild...echo... June 2 010 80% upper left renal artery stenosis. right groin hematoma/abscess..repaired.. December, 2009 Anemia chronic LBBB Obstructive sleep apnea severe arthritis of hips and knees requiring a walker    (01/05/2009 8:49) Added new observation of PRIMARY MD: Vyas (01/05/2009 8:49)       Past History:  Past Medical History: nonischemic cardiomyopathy...EF 25%...nondiagnostic MRI December 2009...  /  echo June, 2010 ICD / CRT... patient has seen Dr.Klein... this will be placed when state insurance issues are in order LVH.. moderately severe... echo.. June, [id number] Chronic systolic heart failure Hypertension Mitral regurgitation...mild/ moderate...echo June, 2010 Aortic insufficiency mild...echo... June 2 010 80% upper left renal artery stenosis. right groin hematoma/abscess..repaired.. December, 2009 Anemia chronic LBBB Obstructive sleep apnea severe arthritis of hips and knees requiring a walker

## 2010-04-03 NOTE — Letter (Signed)
Summary: Implantable Device Instructions  Press photographer, [place]  [id number]   [place], [place] [address]   Phone: [phone]  Fax: [phone]      Implantable Device Instructions  You are scheduled for:  _____ Permanent Transvenous Pacemaker ___X__ Implantable Cardioverter Defibrillator _____ Implantable Loop Recorder _____ Generator Change  on _10/7/11____ with Dr. Caryl Comes   .    1.  Please arrive at the [place] at [facility] at _8:30____ on the day of your procedure.  2.  Do not eat or drink the night before your procedure.  3.  Complete lab work on _10/3/11 AT [facility] LISTED____.   4.  Plan for an overnight stay.  Bring your insurance cards and a list of your medications.  5.  Wash your chest and neck with antibacterial soap (any brand) the evening before and the morning of your procedure.  Rinse well.  6.  Education material received:     Pacemaker _____           ICD __X___           Arrhythmia _____  *If you have ANY questions after you get home, please call the office [phone].  *Every attempt is made to prevent procedures from being rescheduled.  Due to the nauture of Electrophysiology, rescheduling can happen.  The physician is always aware and directs the staff when this occurs.

## 2010-04-03 NOTE — Assessment & Plan Note (Signed)
Summary: 6 WK F/U PER 11/5 OFFICE VISIT-JM  Medications Added FUROSEMIDE 80 MG TABS (FUROSEMIDE) Take 1 tablet by mouth once a day and as needed for swelling CARVEDILOL 12.5 MG TABS (CARVEDILOL) Take one tablet by mouth once daily CARVEDILOL 6.25 MG TABS (CARVEDILOL) Take 1 tablet by mouth once a day        Visit Type:  Follow-up Primary Vertis Bauder:  Jerene Bears, MD  CC:  cardiomyopathy.  History of Present Illness: The patient returns for followup of cardiomyopathy.  She is actually doing quite well.  She may be able to have an ICD placed soon when the insurance issues can be worked out.  She is feeling well.  She has reached her goal with Weight [employer] and 148 pounds.  She has lost approximately 100 pounds over time.  She looks great.  She had some bradycardia and her carvedilol dose was decreased.  Unfortunately she cut it to 12.5 once a day instead of 6.25 b.i.d.  We had pushed her lisinopril dose up to 40 b.i.d.  Her systolic pressure is still mildly elevated.  However recently at another doctor's office her systolic was [id number].  Her current pulse 50.  Preventive Screening-Counseling & Management  Alcohol-Tobacco     Smoking Status: quit > 6 months     Year Started: 1961     Year Quit: 1987  Current Medications (verified): 1)  Lisinopril 40 Mg Tabs (Lisinopril) .... Take 1 Tablet By Mouth Two Times A Day 2)  Isosorbide Mononitrate Cr 60 Mg Xr24h-Tab (Isosorbide Mononitrate) .... Take One Tablet By Mouth Daily 3)  Clonidine Hcl 0.1 Mg Tabs (Clonidine Hcl) .... Take One Tablet By Mouth Three Times A Day 4)  Furosemide 80 Mg Tabs (Furosemide) .... Take 1 Tablet By Mouth Once A Day and As Needed For Swelling 5)  Hydralazine Hcl 50 Mg Tabs (Hydralazine Hcl) .... Take One Tablet By Mouth Three Times A Day 6)  Carvedilol 12.5 Mg Tabs (Carvedilol) .... Take One Tablet By Mouth Once Daily 7)  Potassium Chloride Crys Cr 20 Meq Cr-Tabs (Potassium Chloride Crys Cr) .... Take One Tablet By Mouth  Daily 8)  Spironolactone 25 Mg Tabs (Spironolactone) .... Take One Tablet By Mouth Daily 9)  Aspirin 81 Mg Tbec (Aspirin) .... Take One Tablet By Mouth Daily 10)  Ferrous Sulfate 325 (65 Fe) Mg  Tabs (Ferrous Sulfate) .... Once Daily 11)  Vitamin E 400 Unit Caps (Vitamin E) .... Take 1 Tablet By Mouth Once A Day 12)  Vitamin C 500 Mg  Tabs (Ascorbic Acid) .... Once Daily 13)  Glucosamine-Chondroitin   Caps (Glucosamine-Chondroit-Vit C-Mn) .... Once Daily 14)  Ramipril 2.5 Mg Caps (Ramipril) .... Take One Capsule By Mouth Daily  Allergies: 1)  Keflex  Past History:  Past Medical History: Last updated: 02/19/2009 nonischemic cardiomyopathy...EF 25%...nondiagnostic MRI December 2009...  /  echo June, 2010 PLanned ICD / CRT... patient has seen Dr.Klein... this will be placed when state insurance issues are in order LVH.. moderately severe... echo.. June, [id number] systolic heart failure...chronic Hypertension Mitral regurgitation...mild/ moderate...echo June, 2010 Aortic insufficiency mild...echo... June 2 010 80% upper left renal artery stenosis. right groin hematoma/abscess..repaired.. December, 2009 Anemia LBBB Obstructive sleep apnea arthritis of hips and knees...severe... requiring a walker Bradycardia    Social History: Smoking Status:  quit > 6 months  Review of Systems       Patient denies fever, chills, headache, sweats, rash, change in vision, change in hearing, chest pain, shortness of breath, cough, nausea  vomiting, urinary symptoms.  She has no musculoskeletal complaints.  All other systems are reviewed and are negative.  Vital Signs:  Patient profile:   70 year old female Height:      63 inches Weight:      151.25 pounds BMI:     26.89 Pulse rate:   50 / minute BP sitting:   165 / 78  (right arm) Cuff size:   regular  Vitals Entered By: Gurney Maxin, RN, BSN (February 20, 2009 2:20 PM)  Nutrition Counseling: Patient's BMI is greater than 25 and therefore  counseled on weight management options. CC: cardiomyopathy Comments Follow up visit. No cardiac complaints but pt would like to know if there would be any interaction if she ate grapefruit with her current medications.   Physical Exam  General:  patient is quite stable today. Head:  head is atraumatic. Eyes:  no xanthelasma. Neck:  no jugular venous stent. Chest Wall:  no chest wall tenderness. Lungs:  lungs are clear respiratory effort is nonlabored. Heart:  cardiac exam reveals and S2.  No clicks or significant murmurs. Abdomen:  abdomen is soft. Extremities:  no peripheral edema. Skin:  no skin rashes. Psych:  patient is oriented to person time and place.  Affect is normal.   Impression & Recommendations:  Problem # 1:  BRADYCARDIA (ICD-427.89)  Her updated medication list for this problem includes:    Lisinopril 40 Mg Tabs (Lisinopril) .[name]... Take 1 tablet by mouth two times a day    Isosorbide Mononitrate Cr 60 Mg Xr24h-tab (Isosorbide mononitrate) .[name]... Take one tablet by mouth daily    Carvedilol 6.25 Mg Tabs (Carvedilol) .[name]... Take 1 tablet by mouth once a day    Aspirin 81 Mg Tbec (Aspirin) .[name]... Take one tablet by mouth daily    Ramipril 2.5 Mg Caps (Ramipril) .[name]... Take one capsule by mouth daily The heart rate today is 50.  We will leave her a total dose of 12.5 daily but split the dose to 6.25 b.i.d.  I've chosen not to change her other medicines.  It is noted that she spoke on lisinopril and ALT ACE.  From Adalat to be adjusted further I've chosen not to do that today.  I want to get more blood pressure and heart rate information.  We'll see her back in late January for this.  Problem # 2:  CARDIOMYOPATHY, PRIMARY, EF 25% (ICD-425.4)  Her updated medication list for this problem includes:    Lisinopril 40 Mg Tabs (Lisinopril) .[name]... Take 1 tablet by mouth two times a day    Isosorbide Mononitrate Cr 60 Mg Xr24h-tab (Isosorbide mononitrate) .[name]... Take one tablet by  mouth daily    Furosemide 80 Mg Tabs (Furosemide) .[name]... Take 1 tablet by mouth once a day and as needed for swelling    Carvedilol 6.25 Mg Tabs (Carvedilol) .[name]... Take 1 tablet by mouth once a day    Spironolactone 25 Mg Tabs (Spironolactone) .[name]... Take one tablet by mouth daily    Aspirin 81 Mg Tbec (Aspirin) .[name]... Take one tablet by mouth daily    Ramipril 2.5 Mg Caps (Ramipril) .[name]... Take one capsule by mouth daily Please see the discussion under bradycardia  Problem # 3:  HYPERTENSION (ICD-401.9)  Her updated medication list for this problem includes:    Lisinopril 40 Mg Tabs (Lisinopril) .[name]... Take 1 tablet by mouth two times a day    Clonidine Hcl 0.1 Mg Tabs (Clonidine hcl) .[name]... Take one tablet by mouth three  times a day    Furosemide 80 Mg Tabs (Furosemide) .[name]... Take 1 tablet by mouth once a day and as needed for swelling    Hydralazine Hcl 50 Mg Tabs (Hydralazine hcl) .[name]... Take one tablet by mouth three times a day    Carvedilol 6.25 Mg Tabs (Carvedilol) .[name]... Take 1 tablet by mouth once a day    Spironolactone 25 Mg Tabs (Spironolactone) .[name]... Take one tablet by mouth daily    Aspirin 81 Mg Tbec (Aspirin) .[name]... Take one tablet by mouth daily    Ramipril 2.5 Mg Caps (Ramipril) .[name]... Take one capsule by mouth daily As outlined above under the section of bradycardia, blood pressure is higher than I wanted to be today but it has been lower in other settings.  No other change today.  Patient Instructions: 1)  Your physician recommends that you schedule a follow-up appointment in: January 28th 2011 @2 :15pm. 2)  Your physician has recommended you make the following change in your medication: decrease carvedilol to 6.25mg . You can break your 12.5mg  in half till gone and a new rx sent to your pharmacy.    Prescriptions: CARVEDILOL 6.25 MG TABS (CARVEDILOL) Take 1 tablet by mouth once a day  #30 x 6   Entered by:   Georgina Peer   Authorized by:   Lorenza Evangelist, MD, [facility]   Signed  by:   Georgina Peer on 02/20/2009   Method used:   Electronically to        [facility]. [address]* (retail)       [address] [place]  [address]       Ph: [phone] or [phone]       Fax: [phone]   RxID:   [id number]

## 2010-04-03 NOTE — Assessment & Plan Note (Signed)
Summary: 3 MO FU  Medications Added FUROSEMIDE 80 MG TABS (FUROSEMIDE) Take 1 &1/2 tablet by mouth once a day and as needed for swelling SPIRONOLACTONE 25 MG TABS (SPIRONOLACTONE) Take 1 tablet by mouth two times a day GLUCOSAMINE-CHONDROITIN 750-600 MG TABS (GLUCOSAMINE-CHONDROITIN) Take 1 tablet by mouth two times a day CALCIUM CARBONATE-VITAMIN D 600-400 MG-UNIT  TABS (CALCIUM CARBONATE-VITAMIN D) Take 1 tablet by mouth two times a day ALENDRONATE SODIUM 70 MG TABS (ALENDRONATE SODIUM) take one by mouth weekly      Allergies Added:   Visit Type:  Follow-up Primary Provider:  Jerene Bears, MD  CC:  cardiomyopathy.  History of Present Illness: The patient is seen in followup cardiomyopathy.  I saw her last May 22, 2009.  She is doing well.  ICD has been recommended.  She has been waiting to hear back concerning [facility] physician on her indications for an ICD.  She has not heard anything.  I will ask for help from [place] concerning this.  See his last note in the EMR.  The patient has done well.  She says that she is sleeping on fewer pillows at night.  She has significant ongoing problems with her knee.  However she ambulates adequately with a walker.  She has not had palpitations.  There's been no syncope or presyncope.  Preventive Screening-Counseling & Management  Alcohol-Tobacco     Smoking Status: quit     Year Quit: 1987  Current Medications (verified): 1)  Lisinopril 40 Mg Tabs (Lisinopril) .... Take 1 Tablet By Mouth Two Times A Day 2)  Isosorbide Mononitrate Cr 60 Mg Xr24h-Tab (Isosorbide Mononitrate) .... Take One Tablet By Mouth Daily 3)  Clonidine Hcl 0.1 Mg Tabs (Clonidine Hcl) .... Take One Tablet By Mouth Three Times A Day 4)  Furosemide 80 Mg Tabs (Furosemide) .... Take 1 &1/2 Tablet By Mouth Once A Day and As Needed For Swelling 5)  Hydralazine Hcl 50 Mg Tabs (Hydralazine Hcl) .... Take One Tablet By Mouth Three Times A Day 6)  Carvedilol 6.25 Mg Tabs  (Carvedilol) .... Take 1 Tablet By Mouth Two Times A Day 7)  Potassium Chloride Crys Cr 20 Meq Cr-Tabs (Potassium Chloride Crys Cr) .... Take One Tablet By Mouth Daily 8)  Spironolactone 25 Mg Tabs (Spironolactone) .... Take 1 Tablet By Mouth Two Times A Day 9)  Aspirin 81 Mg Tbec (Aspirin) .... Take One Tablet By Mouth Daily 10)  Ferrous Sulfate 325 (65 Fe) Mg  Tabs (Ferrous Sulfate) .... Once Daily 11)  Vitamin E 400 Unit Caps (Vitamin E) .... Take 1 Tablet By Mouth Once A Day 12)  Vitamin C 500 Mg  Tabs (Ascorbic Acid) .... Once Daily 13)  Glucosamine-Chondroitin 750-600 Mg Tabs (Glucosamine-Chondroitin) .... Take 1 Tablet By Mouth Two Times A Day 14)  Ramipril 2.5 Mg Caps (Ramipril) .... Take One Capsule By Mouth Daily 15)  Calcium Carbonate-Vitamin D 600-400 Mg-Unit  Tabs (Calcium Carbonate-Vitamin D) .... Take 1 Tablet By Mouth Two Times A Day 16)  Alendronate Sodium 70 Mg Tabs (Alendronate Sodium) .... Take One By Mouth Weekly  Allergies (verified): 1)  Keflex  Comments:  Nurse/Medical Assistant: The patient's medication bottles and allergies were reviewed with the patient and were updated in the Medication and Allergy Lists.  Past History:  Past Medical History: nonischemic cardiomyopathy...EF 25%...nondiagnostic MRI December 2009...  /  echo June, 2010 Septal hypertrophy....echo....04/2009....,.decide if family to be screened PLanned ICD / CRT... patient has seen Dr.Klein... this will be placed when  state insurance issues are in order LVH.. moderately severe... echo.. June, [id number] systolic heart failure...chronic Hypertension Mitral regurgitation...mild/ moderate...echo June, 2010 Aortic insufficiency mild...echo... June 2 010 80% upper left renal artery stenosis. right groin hematoma/abscess..repaired.. December, 2009 Anemia LBBB Obstructive sleep apnea arthritis of hips and knees...severe... requiring a walker Bradycardia    Social History: Smoking Status:   quit  Review of Systems       Patient denies fever, chills, headache, sweats, rash, change in vision, change in hearing, chest pain, cough, nausea or vomiting, urinary symptoms.  All other systems are reviewed and are negative.  Vital Signs:  Patient profile:   70 year old female Height:      63 inches Weight:      158 pounds Pulse rate:   51 / minute BP sitting:   144 / 72  (left arm) Cuff size:   regular  Vitals Entered By: Georgina Peer (September 07, 2009 2:34 PM)  Physical Exam  General:  patient is stable today. Eyes:  no xanthelasma. Neck:  no jugular venous extension. Lungs:  lungs are clear.  Respiratory effort is not labored. Heart:  cardiac exam reveals S1 and S2.  There is a 2/6 systolic murmur. Abdomen:  abdomen is soft. Extremities:  no significant peripheral edema. Skin:  no skin rashes. Psych:  patient is oriented to person time and place.  Affect is normal.   Impression & Recommendations:  Problem # 1:  * SEPTAL HYPERTROPHY The patient has sent information to other family members letting them know that screening is recommended for them.  Problem # 2:  BRADYCARDIA (ICD-427.89)  Her updated medication list for this problem includes:    Lisinopril 40 Mg Tabs (Lisinopril) .[name]... Take 1 tablet by mouth two times a day    Isosorbide Mononitrate Cr 60 Mg Xr24h-tab (Isosorbide mononitrate) .[name]... Take one tablet by mouth daily    Carvedilol 6.25 Mg Tabs (Carvedilol) .[name]... Take 1 tablet by mouth two times a day    Aspirin 81 Mg Tbec (Aspirin) .[name]... Take one tablet by mouth daily    Ramipril 2.5 Mg Caps (Ramipril) .[name]... Take one capsule by mouth daily The patient has not had any significant problems from bradycardia.  No change in therapy.  Problem # 3:  SLEEP APNEA, OBSTRUCTIVE (ICD-327.23) The patient continues to wear her CPAP at home with oxygen.  Problem # 4:  CARDIOMYOPATHY, PRIMARY, EF 25% (ICD-425.4) The patient's cardiomyopathy was re\re documented by echo  in February, 2011.  Ejection fraction at that time was in the 30-35% range.  ICD is indicated.  I will ask Dr. Caryl Comes to help [place] recheck concerning her status.  Problem # 5:  HYPERTENSION (ICD-401.9) Blood pressure is under adequate control at this time.[name]  No change in therapy.  Appended Document: [facility]      Allergies: 1)  Keflex   Patient Instructions: 1)  Your physician wants you to follow-up in: 6 months. You will receive a reminder letter in the mail one-two months in advance. If you don't receive a letter, please call our office to schedule the follow-up appointment. 2)  Medications were refilled today. Prescriptions: POTASSIUM CHLORIDE CRYS CR 20 MEQ CR-TABS (POTASSIUM CHLORIDE CRYS CR) Take one tablet by mouth daily  #30 x 6   Entered by:   Gurney Maxin, RN, BSN   Authorized by:   Lorenza Evangelist, MD, [facility]   Signed by:   Gurney Maxin, RN, BSN on 09/07/2009   Method used:  Electronically to        [facility]. [address]* (retail)       [address] [place]  [address]       Ph: [phone] or [phone]       Fax: [phone]   RxID:   [id number] HYDRALAZINE HCL 50 MG TABS (HYDRALAZINE HCL) Take one tablet by mouth three times a day  #90 x 6   Entered by:   Gurney Maxin, RN, BSN   Authorized by:   Lorenza Evangelist, MD, [facility]   Signed by:   Gurney Maxin, RN, BSN on 09/07/2009   Method used:   Electronically to        [facility]. [address]* ([place]  [address]       Ph: [phone] or [phone]       Fax: [phone]   RxID:   [id number] ISOSORBIDE MONONITRATE CR 60 MG XR24H-TAB (ISOSORBIDE MONONITRATE) Take one tablet by mouth daily  #30 x 6   Entered by:   Gurney Maxin, RN, BSN   Authorized by:   Lorenza Evangelist, MD, [facility]   Signed by:   Gurney Maxin, RN, BSN on 09/07/2009   Method used:   Electronically to        [facility]. [address] (retail)        [address] [place]  [address]       Ph: [phone] or [phone]       Fax: [phone]   RxID:   [id number]

## 2010-04-03 NOTE — Letter (Signed)
Summary: Risk analyst at [facility]. [address]   [place],  [address]   Phone: [phone]  Fax: [phone]        December 12, 2008 MRN: [id number]     Gina [name] [address] [place]  [address]   Dear Ms. [name],  Your test ordered by Rande Lawman has been reviewed by your physician (or physician assistant) and was found to be normal or stable. Your physician (or physician assistant) felt no changes were needed at this time.  ____ Echocardiogram  ____ Cardiac Stress Test  __X__ Lab Work  ____ Peripheral vascular study of arms, legs or neck  ____ CT scan or X-ray  ____ Lung or Breathing test  ____ Other:   Thank you.   Gurney Maxin, RN, BSN    Bryon Lions, M.D., F.A.C.C. Maceo Pro, M.D., F.A.C.C. Cammy Copa, M.D., F.A.C.C. Vonda Antigua, M.D., F.A.C.C. Vita Barley, M.D., F.A.C.C. Mare Ferrari, M.D., F.A.C.C. Emi Belfast, PA-C

## 2010-04-03 NOTE — Miscellaneous (Signed)
  Clinical Lists Changes  Observations: Added new observation of CARDIO HPI: I spoke with Dr. Caryl Comes about the septal hypertrophy on echo. I will review and decide if her family needs to be screened. (04/23/2009 5:39) Added new observation of PRIMARY MD: Jerene Bears, MD (04/23/2009 5:39)      Primary Provider:  Jerene Bears, MD   History of Present Illness: I spoke with Dr. Caryl Comes about the septal hypertrophy on echo. I will review and decide if her family needs to be screened.

## 2010-04-03 NOTE — Miscellaneous (Signed)
  Clinical Lists Changes  Observations: Added new observation of PAST MED HX: .EF 25%...nondiagnostic MRI December 2009...  /  echo June, 2010  /   echo... October, 2011.... ejection fraction improved... EF 60% Cardiomyopathy nonischemic.... EF improved October, 2011 Septal hypertrophy....echo....04/2009....patient encouraged the family to be screened elsewhere PLanned ICD / CRT... patient has seen Dr.Klein..EF improved October, 2011 LVH.. moderately severe... echo.. June, 2010  /  EF improved October, 2011.... cancel plans for ICD systolic heart failure...chronic Hypertension Mitral regurgitation...mild/ moderate...echo June, 2010  /   echo.. October, 2011.... mitral regurgitation improved Aortic insufficiency mild...echo... June 2 010 80% upper left renal artery stenosis. right groin hematoma/abscess..repaired.. December, 2009 Anemia   further workup needed... October, 2011 LBBB Obstructive sleep apnea arthritis of hips and knees...severe... requiring a walker Bradycardia Atrial fibrillation    first diagnosed December 08, 2009.... Coumadin started Coumadin therapy   started October, 2011  for atrial fibrillation Acute renal failure   hospitalized... December 08, 2009... improved with hydration in the hospital    (01/26/2010 18:21) Added new observation of PRIMARY MD: Jerene Bears, MD (01/26/2010 18:21)       Past History:  Past Medical History: .EF 25%...nondiagnostic MRI December 2009...  /  echo June, 2010  /   echo... October, 2011.... ejection fraction improved... EF 60% Cardiomyopathy nonischemic.... EF improved October, 2011 Septal hypertrophy....echo....04/2009....patient encouraged the family to be screened elsewhere PLanned ICD / CRT... patient has seen Dr.Klein..EF improved October, 2011 LVH.. moderately severe... echo.. June, 2010  /  EF improved October, 2011.... cancel plans for ICD systolic heart failure...chronic Hypertension Mitral regurgitation...mild/  moderate...echo June, 2010  /   echo.. October, 2011.... mitral regurgitation improved Aortic insufficiency mild...echo... June 2 010 80% upper left renal artery stenosis. right groin hematoma/abscess..repaired.. December, 2009 Anemia   further workup needed... October, 2011 LBBB Obstructive sleep apnea arthritis of hips and knees...severe... requiring a walker Bradycardia Atrial fibrillation    first diagnosed December 08, 2009.... Coumadin started Coumadin therapy   started October, 2011  for atrial fibrillation Acute renal failure   hospitalized... December 08, 2009... improved with hydration in the hospital

## 2010-04-03 NOTE — Progress Notes (Signed)
 ----   Converted from flag ---- ---- 12/11/2009 1:33 PM, Terald Sleeper, MD, [facility] wrote: I send you voice message. patient has an inflammatory anemia ( normochromic normocytic with low TIBC, low FeS, low retic count and high ferritin- hepcidin test  not commercially available yet - EPO level pending/FOBS pending but suspect will be negative ). They are going to scope her but see no indication. EF has normalized in interim- no indication for ICD. ------------------------------  Appended Document:  Dr. Caryl Comes, pls review msg above and adv if still want to proceed w/ICD implant. Joan Flores, RN, BSN

## 2010-04-03 NOTE — Miscellaneous (Signed)
Summary: RX - LISINOPRIL  Clinical Lists Changes  Medications: Added new medication of LISINOPRIL 40 MG TABS (LISINOPRIL) Take 1 tablet by mouth once a day - Signed Rx of LISINOPRIL 40 MG TABS (LISINOPRIL) Take 1 tablet by mouth once a day;  #30 x 6;  Signed;  Entered by: Lovina Reach, LPN;  Authorized by: Maryjane Hurter, PA-C;  Method used: Electronically to [facility]. [address], [place]  [address], Ph: [phone] or [phone], Fax: [phone]    Prescriptions: LISINOPRIL 40 MG TABS (LISINOPRIL) Take 1 tablet by mouth once a day  #30 x 6   Entered by:   Lovina Reach, LPN   Authorized by:   Maryjane Hurter, PA-C   Signed by:   Lovina Reach, LPN on [id number]   Method used:   Electronically to        [facility]. [address] (retail)       [address] [place]  [address]       Ph: [phone] or [phone]       Fax: [phone]   RxID:   [id number]

## 2010-04-03 NOTE — Consult Note (Signed)
Summary: CARDIOLOGY CONSULT/ [facility]/ [place]   Imported By: Delfino Lovett 01/26/2010 10:15:05  _____________________________________________________________________  External Attachment:    Type:   Image     Comment:   External Document

## 2010-04-03 NOTE — Miscellaneous (Signed)
  Clinical Lists Changes  Observations: Added new observation of PAST MED HX: .EF 25%...nondiagnostic MRI December 2009...  /  echo June, 2010  /   echo... October, 2011.... ejection fraction improved... EF 60% Cardiomyopathy nonischemic.... EF improved October, 2011 Septal hypertrophy....echo....04/2009....,.decide if family to be screened PLanned ICD / CRT... patient has seen Dr.Klein... this will be placed when state insurance issues are in order LVH.. moderately severe... echo.. June, 2010  /  EF improved October, 2011.... cancel plans for ICD systolic heart failure...chronic Hypertension Mitral regurgitation...mild/ moderate...echo June, 2010  /   echo.. October, 2011.... mitral regurgitation improved Aortic insufficiency mild...echo... June 2 010 80% upper left renal artery stenosis. right groin hematoma/abscess..repaired.. December, 2009 Anemia   further workup needed... October, 2011 LBBB Obstructive sleep apnea arthritis of hips and knees...severe... requiring a walker Bradycardia Atrial fibrillation    first diagnosed December 08, 2009.... Coumadin started Coumadin therapy   started October, 2011  for atrial fibrillation Acute renal failure   hospitalized... December 08, 2009... improved with hydration in the hospital    (01/26/2010 9:45) Added new observation of PRIMARY MD: Jerene Bears, MD (01/26/2010 9:45)       Past History:  Past Medical History: .EF 25%...nondiagnostic MRI December 2009...  /  echo June, 2010  /   echo... October, 2011.... ejection fraction improved... EF 60% Cardiomyopathy nonischemic.... EF improved October, 2011 Septal hypertrophy....echo....04/2009....,.decide if family to be screened PLanned ICD / CRT... patient has seen Dr.Klein... this will be placed when state insurance issues are in order LVH.. moderately severe... echo.. June, 2010  /  EF improved October, 2011.... cancel plans for ICD systolic heart failure...chronic Hypertension Mitral  regurgitation...mild/ moderate...echo June, 2010  /   echo.. October, 2011.... mitral regurgitation improved Aortic insufficiency mild...echo... June 2 010 80% upper left renal artery stenosis. right groin hematoma/abscess..repaired.. December, 2009 Anemia   further workup needed... October, 2011 LBBB Obstructive sleep apnea arthritis of hips and knees...severe... requiring a walker Bradycardia Atrial fibrillation    first diagnosed December 08, 2009.... Coumadin started Coumadin therapy   started October, 2011  for atrial fibrillation Acute renal failure   hospitalized... December 08, 2009... improved with hydration in the hospital

## 2010-04-03 NOTE — Assessment & Plan Note (Signed)
Summary: 1 WK F/U PER 11/28 OV-JM  Medications Added FUROSEMIDE 80 MG TABS (FUROSEMIDE) Take one tablet by mouth two times a day      Allergies Added:   Visit Type:  Follow-up Primary Provider:  Jerene Bears, MD  CC:  CHF.  History of Present Illness: The patient is seen for follow up of her CHF.  I saw her last on January 29, 2010.  She seemed stable.  However within a few days she was readmitted to the hospital in [place].  I have obtained the records and reviewed them.  Have also spoken with her about the episode.  She seemed to be stable but then gained 3 pounds in one day.  She tried taking some extra diuretics but it did not help and she developed acute shortness of breath.  She was intubated again and came off the respirator within 24 hours.  Her weight is now down to 162 at home this is still higher than her best dry weight.  Her weight today on our scale is 166 pounds which is down 5 pounds from when she was here on November 28.  She feels fine today.  Her clonidine is on a higher dose and she has a dry mouth.  Preventive Screening-Counseling & Management  Alcohol-Tobacco     Smoking Status: quit     Year Quit: 1987  Current Medications (verified): 1)  Isosorbide Mononitrate Cr 60 Mg Xr24h-Tab (Isosorbide Mononitrate) .... Take One Tablet By Mouth Daily 2)  Clonidine Hcl 0.2 Mg Tabs (Clonidine Hcl) .... Take 1 Tablet By Mouth Three Times A Day 3)  Furosemide 40 Mg Tabs (Furosemide) .... Take 2 Tablets Every Morning and 1 Tablet Every Evening As Directed 4)  Hydralazine Hcl 50 Mg Tabs (Hydralazine Hcl) .... Take One Tablet By Mouth Three Times A Day 5)  Carvedilol 3.125 Mg Tabs (Carvedilol) .... Take 1 Tablet By Mouth Two Times A Day 6)  Potassium Chloride Crys Cr 20 Meq Cr-Tabs (Potassium Chloride Crys Cr) .... Take One Tablet By Mouth Two Times A Day 7)  Aspirin 81 Mg Tbec (Aspirin) .... Take One Tablet By Mouth Daily 8)  Iron 65mg  .... Take 1 Tablet By Mouth Once A  Day 9)  Vitamin E 400 Unit Caps (Vitamin E) .... Take 1 Tablet By Mouth Once A Day 10)  Vitamin C 500 Mg  Tabs (Ascorbic Acid) .... Once Daily 11)  Glucosamine-Chondroitin 750-600 Mg Tabs (Glucosamine-Chondroitin) .... Take 1 Tablet By Mouth Two Times A Day 12)  Ramipril 2.5 Mg Caps (Ramipril) .... Take One Capsule By Mouth Daily 13)  Calcium Carbonate-Vitamin D 600-400 Mg-Unit  Tabs (Calcium Carbonate-Vitamin D) .... Take 1 Tablet By Mouth Two Times A Day 14)  Alendronate Sodium 70 Mg Tabs (Alendronate Sodium) .... Take One By Mouth Weekly  Allergies (verified): 1)  Keflex  Comments:  Nurse/Medical Assistant: The patient's medication list and allergies were reviewed with the patient and were updated in the Medication and Allergy Lists.  Past History:  Past Medical History: .EF 25%...nondiagnostic MRI December 2009...  /  echo June, 2010  /   echo... December 08, 2009.... ejection fraction improved... EF 60% /  EF 30%... echo.... [place]... December 28, 2009 with CHF Cardiomyopathy nonischemic.... EF improved October, 2011 Septal hypertrophy....echo....04/2009....patient encouraged the family to be screened elsewhere PLanned ICD / CRT... patient has seen Dr.Klein..EF improved October, 2011 LVH.. moderately severe... echo.. June, 2010  /  EF improved October, 2011.... cancel plans for ICD systolic heart failure...chronic  Hypertension Mitral regurgitation...mild/ moderate...echo June, 2010  /   echo.. October, 2011.... mitral regurgitation improved Aortic insufficiency mild...echo... June 2 010 80% upper left renal artery stenosis. right groin hematoma/abscess..repaired.. December, 2009 Anemia   further workup needed... October, 2011 LBBB Obstructive sleep apnea arthritis of hips and knees...severe... requiring a walker Bradycardia Acute renal failure   hospitalized... December 08, 2009... improved with hydration in the hospital  ??? atrial fibrillation during hospitalization  October, 2011 ???..    Review of Systems       Patient denies fever, chills, headache, sweats, rash, change in vision, change in hearing, chest pain, cng, urinary symptoms.  All other systems are reviewed and are negative.  Vital Signs:  Patient profile:   70 year old female Height:      63 inches Weight:      166 pounds Pulse rate:   60 / minute BP sitting:   175 / 86  (left arm) Cuff size:   regular  Vitals Entered By: Georgina Peer (February 07, 2010 2:07 PM)  Physical Exam  General:  in general she looks good today. Head:  head is atraumatic. Eyes:  no xanthelasma. she does have continued swelling of the eyelids.  Some of this is chronic. Neck:  no jugular venous extension. Chest Wall:  no chest wall tenderness. Lungs:  lungs are clear.  Respiratory effort is nonlabored. Heart:  cardiac exam reveals S1-S2.  No clicks or significant murmurs. Abdomen:  abdomen is soft. Msk:  patient comes in with her walker. Extremities:  no peripheral edema. Skin:  no skin rashes. Psych:  patient is oriented to person time and place. Affect is normal.   Impression & Recommendations:  Problem # 1:  * ??  ATRIAL FIBRILLATION  ??  Orders: EKG w/ Interpretation (93000) EKG is done today.  It is reviewed by me.  She has left bundle branch block.  There is normal sinus rhythm.  Problem # 2:  * PLAN FOR ICD The patient's plan for ICD had been put on hold when it appeared that she had one echo showing good LV function.  She has an EF of 30% again.  Am also having a very difficult time keeping her out of the hospital with heart failure.  The best plan may be to proceed with biventricular pacing.  Problem # 3:  CHRONIC SYSTOLIC HEART FAILURE (123456) The patient has had recurrent episodes with acute heart failure.  There is no history of coronary disease.  This issue will have to be revisited.  Also there is no definite evidence of renal artery stenosis that would be causing this.  However  she does have an 80% lesion in a left renal artery but there are 2 branches in that kidney.  Consideration will be given to further assessment of her renal arteries.  Problem # 4:  RENAL ARTERY STENOSIS (ICD-440.1) Labs will be checked today.  Problem # 5:  HYPERTENSION (ICD-401.9) I am concerned that hypertension is playing a role with her recurrent episodes.  I am also concerned about her hypertensive regimen.  For now we will be pushing her diuretic harder.  Also be starting 2.5 mg of amlodipine.  In the past she had some swelling with this.  We will be following her very carefully.  She'll have chemistry checked today and also here for early followup.  Other Orders: T-Basic Metabolic Panel (99991111) T-Basic Metabolic Panel (99991111)  Patient Instructions: 1)  Increase Lasix (furosemide) 80mg  by mouth two times  a day.  2)  Your physician recommends that you go to the [facility] for lab work: DO TODAY! 3)  Do lab work at [facility] the morning of your appt (12/14).  4)  Follow up with Dr. Ron Parker on Wednesday, February 14, 2010 at 9:45am. Prescriptions: FUROSEMIDE 80 MG TABS (FUROSEMIDE) Take one tablet by mouth two times a day  #60 x 6   Entered by:   Gurney Maxin, RN, BSN   Authorized by:   Lorenza Evangelist, MD, [facility]   Signed by:   Gurney Maxin, RN, BSN on 02/07/2010   Method used:   Electronically to        [facility]. [address] (retail)       [address] [place]  [address]       Ph: [phone] or [phone]       Fax: [phone]   RxID:   [id number]

## 2010-04-03 NOTE — Letter (Signed)
Summary: Discharge [facility]  Discharge Summary/ [place] By: Bartholomew Boards 01/29/2010 10:24:48  _____________________________________________________________________  External Attachment:    Type:   Image     Comment:   External Document

## 2010-04-03 NOTE — Miscellaneous (Signed)
  Clinical Lists Changes  Problems: Added new problem of CARDIOMYOPATHY (ICD-425.4) Added new problem of CHRONIC SYSTOLIC HEART FAILURE (123456) Added new problem of HYPERTENSION (ICD-401.9) Added new problem of MITRAL REGURGITATION (ICD-396.3) Added new problem of AORTIC INSUFFICIENCY (ICD-424.1) Added new problem of RENAL ARTERY STENOSIS (ICD-440.1) Added new problem of LBBB (ICD-426.3) Added new problem of OBSTRUCTIVE SLEEP APNEA (ICD-327.23) Added new problem of PERICARDIAL EFFUSION (ICD-423.9) Observations: Added new observation of PAST MED HX: nonischemic cardiomyopathy...EF 25%...nondiagnostic MRI December 123XX123 Chronic systolic heart failure Hypertension Mitral regurgitation... moderate Aortic insufficiency mild 80% upper left renal artery stenosis. right groin hematoma/abscess..repaired.. December, 2009 Anemia chronic LBBB Obstructive sleep apnea Pericardial effusion... in the past as  (11/21/2008 9:24)       Past History:  Past Medical History: nonischemic cardiomyopathy...EF 25%...nondiagnostic MRI December 123XX123 Chronic systolic heart failure Hypertension Mitral regurgitation... moderate Aortic insufficiency mild 80% upper left renal artery stenosis. right groin hematoma/abscess..repaired.. December, 2009 Anemia chronic LBBB Obstructive sleep apnea Pericardial effusion... in the past as

## 2010-04-03 NOTE — Progress Notes (Signed)
Summary: BP &HR readings   Phone Note Call from Patient Call back at Home Phone [phone]   Caller: Patient Call For: nurse & MD Summary of Call: patient called and left message on machine r/e BP&HR readings that she was informed to call and give report on. 04/02/09 am BP-138/70 P-53; pm BP-193/104 P-58;04/03/09 am BP-157/75 P-51;pm 177/96 P-60;04/05/09 BP-153/79 P-53;pm 194/89 P-55;04/06/09 am BP 152/78 P-55,pm 193/85 P-52.04/07/09 @1 :45pm BP 150/80 P-62. Initial call taken by: Georgina Peer,  April 07, 2009 3:45 PM  Follow-up for Phone Call        data reviewed; will defer to Dr Ron Parker re: medication adjustments Follow-up by: Maryjane Hurter, PA-C,  April 07, 2009 4:40 PM  Additional Follow-up for Phone Call Additional follow up Details #1::        BP is OK for now until I see her back.  Lorenza Evangelist, MD, [facility]  April 10, 2009 4:52 PM    Additional Follow-up for Phone Call Additional follow up Details #2::    Patient informed of the above.  Follow-up by: Georgina Peer,  April 11, 2009 9:51 AM

## 2010-04-03 NOTE — Progress Notes (Signed)
Summary: ICD placement   Phone Note From Other Clinic   Caller: Nurse Summary of Call: Per Amy Dr Sharyon Cable Cardiologist in [place] calling to have pt scheduled for ICD inplant. I explained to nurse that pt had been scheduled for this but was cancelled due to abn labs. Pt does want to have this rescheduled. [phone]. Please call pt to discuss this Initial call taken by: Lorraine Lax,  January 09, 2010 10:34 AM  Follow-up for Phone Call        per dr GD EF had normalized  no indication  Follow-up by: Nikki Dom, MD, [facility],  January 09, 2010 5:24 PM

## 2010-04-03 NOTE — Cardiovascular Report (Signed)
Summary: Pre-Op Orders  Pre-Op Orders   Imported By: Marilynne Drivers 12/01/2009 16:14:09  _____________________________________________________________________  External Attachment:    Type:   Image     Comment:   External Document

## 2010-04-03 NOTE — Assessment & Plan Note (Signed)
Summary: nep/eval for icd/nonischemic cardiomyopathy      Allergies Added:   Primary Provider:  Woody Seller  CC:  NEP/EVALUATION nonischemic cardiomyopathy.  History of Present Illness: Gina [name] is seen at the request of Dr. Ron Parker for consideration of ICD implantation for sudden cardiac death prevention for nonischemic cardiomyopathy persisting despite optimal  pharmacological therapy after one year.    She presented at that time with fluid overload. She underwent catheterization that was complicated by major bleed requiring surgical repair. Over the ensuing months despite medical therapy her ejection fraction unfortunately remained unchanged in the 20th 25% range.  Her functional status is limited primarily by severe arthritis in her knees and her hips. She does not encroach upon respiratory insufficiency as she walks. She has not had problems recently with peripheral edema or nocturnal dyspnea.  He has not had syncope or palpitations.  She has a history of morbid obesity. He has upon arrival at the office today lost a total of 98 pounds over the last 3 years.  Current Medications (verified): 1)  Lisinopril 40 Mg Tabs (Lisinopril) .... Take 1 Tablet By Mouth Once A Day 2)  Isosorbide Mononitrate Cr 60 Mg Xr24h-Tab (Isosorbide Mononitrate) .... Take One Tablet By Mouth Daily 3)  Clonidine Hcl 0.1 Mg Tabs (Clonidine Hcl) .... Take One Tablet By Mouth Three Times A Day 4)  Furosemide 80 Mg Tabs (Furosemide) .... Take One Tablet By Mouth Daily. 5)  Hydralazine Hcl 50 Mg Tabs (Hydralazine Hcl) .... Take One Tablet By Mouth Three Times A Day 6)  Carvedilol 12.5 Mg Tabs (Carvedilol) .... Take One Tablet By Mouth Twice A Day 7)  Potassium Chloride Crys Cr 20 Meq Cr-Tabs (Potassium Chloride Crys Cr) .... Take One Tablet By Mouth Daily 8)  Spironolactone 25 Mg Tabs (Spironolactone) .... Take One Tablet By Mouth Daily 9)  Aspirin 81 Mg Tbec (Aspirin) .... Take One Tablet By Mouth Daily 10)  Ferrous  Sulfate 325 (65 Fe) Mg  Tabs (Ferrous Sulfate) .... Once Daily 11)  Vitamin E 600 Unit  Caps (Vitamin E) .... Once Daily 12)  Vitamin C 500 Mg  Tabs (Ascorbic Acid) .... Once Daily 13)  Glucosamine-Chondroitin   Caps (Glucosamine-Chondroit-Vit C-Mn) .... Once Daily 14)  Ramipril 2.5 Mg Caps (Ramipril) .... Take One Capsule By Mouth Daily  Allergies (verified): 1)  ! Keflex  Past History:  Past Medical History: nonischemic cardiomyopathy...EF 25%...nondiagnostic MRI December 123XX123 Chronic systolic heart failure Hypertension Mitral regurgitation... moderate Aortic insufficiency mild 80% upper left renal artery stenosis. right groin hematoma/abscess..repaired.. December, 2009 Anemia chronic LBBB Obstructive sleep apnea severe arthritis of hips and knees requiring a walker    Past Surgical History: hysterectomy surgical repair of a catheterization associated femoral artery injury  Family History: negative for coronary artery disease  Social History: Tobacco Use - No.  Alcohol Use - no Married no children  Review of Systems       full review of systems was negative apart from a history of present illness and past medical history.   Vital Signs:  Patient profile:   70 year old female Height:      64 inches Weight:      156 pounds BMI:     26.87 Pulse rate:   62 / minute Pulse rhythm:   regular BP sitting:   152 / 84  (left arm) Cuff size:   regular  Vitals Entered By: Doug Sou CMA (December 20, 2008 4:13 PM)  Physical Exam  General:  Alert and  oriented older Caucasian female appearing her stated age leaning over her walker in no acute distress. HEENT exam no xanthelasma and normocephalic. Neck veins were flat; carotids brisk and full without bruits. No lymphadenopathy. Back without kyphosis. Lungs clear. Heart sounds regular with 2/6 systolic murmur and an S4.[name] PMI nondisplaced. Abdomen soft with active bowel sounds without midline pulsation or hepatomegaly.  Femoral pulses and distal pulses intact. Extremities were without clubbing cyanosis or edemaSkin warm and dry. Neurological exam grossly normal affect is euthymic   EKG  Procedure date:  12/20/2008  Findings:      sinus rhythm at 57 Intervals 0.18/0.18/0.51 Axis -51 Left bundle branch block/left axis deviation  Impression & Recommendations:  Problem # 1:  CARDIOMYOPATHY, PRIMARY, EF 25% (ICD-425.4) Gina [name] has a significant and persistent nonischemic cardiomyopathy that leaves her risk for sudden cardiac death. It is appropriate given her class II congestive symptoms that she be considered for ICD implantation for reduction of risk of sudden cardiac death. We have reviewed the potential benefits as well as the potential risks including but not limited to death perforation infection and lead dislodgment. She understands these risks and would like to proceed.  However, she also has left bundle branch block. Based on the recent FDA approval of the MADIT-CRT indication, suggested that we weight for the [facility] local coverage determination to allow for implantation of a CRT device to further prevent the progression of congestive symptoms and to decrease the risks of hospitalization. She is agreeable to waiting. We discussed the potential risks of waiting and in her mind they are acceptable.  Will plan to regroup in about 3 months by which time we hope to have the [facility] local determination.  Furthermore she has significant bradycardia. Dual-chamber device implantation with left ventricular lead placement would be appropriate at that time.  Problem # 2:  LBBB (ICD-426.3) please see above  Problem # 3:  CHRONIC SYSTOLIC HEART FAILURE (123456) please see above  Problem # 4:  SINUS BRADYCARDIA (ICD-427.81) please see above discussion

## 2010-04-03 NOTE — Miscellaneous (Signed)
  Clinical Lists Changes  Problems: Added new problem of ATRIAL FIBRILLATION, HX OF (ICD-V12.59) Added new problem of COUMADIN THERAPY (ICD-V58.61) Added new problem of * ACUTE RENAL FAILURE  OCTOBER, 2011 Observations: Added new observation of PAST MED HX: nonischemic cardiomyopathy...EF 25%...nondiagnostic MRI December 2009...  /  echo June, 2010  /   echo... October, 2011.... ejection fraction improved... EF 60% Septal hypertrophy....echo....04/2009....,.decide if family to be screened PLanned ICD / CRT... patient has seen Dr.Klein... this will be placed when state insurance issues are in order LVH.. moderately severe... echo.. June, 2010  /  EF improved October, 2011.... cancel plans for ICD systolic heart failure...chronic Hypertension Mitral regurgitation...mild/ moderate...echo June, 2010  /   echo.. October, 2011.... mitral regurgitation improved Aortic insufficiency mild...echo... June 2 010 80% upper left renal artery stenosis. right groin hematoma/abscess..repaired.. December, 2009 Anemia   further workup needed... October, 2011 LBBB Obstructive sleep apnea arthritis of hips and knees...severe... requiring a walker Bradycardia Atrial fibrillation    first diagnosed December 08, 2009.... Coumadin started Coumadin therapy   started October, 2011  for atrial fibrillation Acute renal failure   hospitalized... December 08, 2009... improved with hydration in the hospital    (01/01/2010 12:37) Added new observation of PRIMARY MD: Jerene Bears, MD (01/01/2010 12:37)       Past History:  Past Medical History: nonischemic cardiomyopathy...EF 25%...nondiagnostic MRI December 2009...  /  echo June, 2010  /   echo... October, 2011.... ejection fraction improved... EF 60% Septal hypertrophy....echo....04/2009....,.decide if family to be screened PLanned ICD / CRT... patient has seen Dr.Klein... this will be placed when state insurance issues are in order LVH.. moderately severe... echo..  June, 2010  /  EF improved October, 2011.... cancel plans for ICD systolic heart failure...chronic Hypertension Mitral regurgitation...mild/ moderate...echo June, 2010  /   echo.. October, 2011.... mitral regurgitation improved Aortic insufficiency mild...echo... June 2 010 80% upper left renal artery stenosis. right groin hematoma/abscess..repaired.. December, 2009 Anemia   further workup needed... October, 2011 LBBB Obstructive sleep apnea arthritis of hips and knees...severe... requiring a walker Bradycardia Atrial fibrillation    first diagnosed December 08, 2009.... Coumadin started Coumadin therapy   started October, 2011  for atrial fibrillation Acute renal failure   hospitalized... December 08, 2009... improved with hydration in the hospital

## 2010-04-03 NOTE — Assessment & Plan Note (Signed)
Summary: PER CHECK OUT/SF      Allergies Added:   Primary Provider:  Jerene Bears, MD  CC:  follow up. Pt had echo 04/11/2009.  She has had no SOB she states over the last couple of months.  She feels well.  History of Present Illness: Gina [name] is seen in followup for consideration of CRT-D implantation for primary prevention in the setting of nonischemic cardiomyopathy left bundle branch block and congestive heart failure.  Her symptoms remain stable. She is largely limited by her walker. She does have mild chronic shortness of breath. There is no peripheral edema.  She recently underwent an echo demonstrated persistent left ventricular dysfunction. Ejection fraction is 30-35%. There was alsof basal septal hypertrophy raising the question my mind as to whether this might be a phenotypic variation of hypertrophic myopathy  Current Medications (verified): 1)  Lisinopril 40 Mg Tabs (Lisinopril) .... Take 1 Tablet By Mouth Two Times A Day 2)  Isosorbide Mononitrate Cr 60 Mg Xr24h-Tab (Isosorbide Mononitrate) .... Take One Tablet By Mouth Daily 3)  Clonidine Hcl 0.1 Mg Tabs (Clonidine Hcl) .... Take One Tablet By Mouth Three Times A Day 4)  Furosemide 80 Mg Tabs (Furosemide) .... Take 1 Tablet By Mouth Once A Day and As Needed For Swelling 5)  Hydralazine Hcl 50 Mg Tabs (Hydralazine Hcl) .... Take One Tablet By Mouth Three Times A Day 6)  Carvedilol 6.25 Mg Tabs (Carvedilol) .... Take 1 Tablet By Mouth Two Times A Day 7)  Potassium Chloride Crys Cr 20 Meq Cr-Tabs (Potassium Chloride Crys Cr) .... Take One Tablet By Mouth Daily 8)  Spironolactone 25 Mg Tabs (Spironolactone) .... Take One Tablet By Mouth Daily 9)  Aspirin 81 Mg Tbec (Aspirin) .... Take One Tablet By Mouth Daily 10)  Ferrous Sulfate 325 (65 Fe) Mg  Tabs (Ferrous Sulfate) .... Once Daily 11)  Vitamin E 400 Unit Caps (Vitamin E) .... Take 1 Tablet By Mouth Once A Day 12)  Vitamin C 500 Mg  Tabs (Ascorbic Acid) .... Once Daily 13)   Glucosamine-Chondroitin   Caps (Glucosamine-Chondroit-Vit C-Mn) .... Once Daily 14)  Ramipril 2.5 Mg Caps (Ramipril) .... Take One Capsule By Mouth Daily  Allergies (verified): 1)  Keflex  Past History:  Past Medical History: Last updated: 02/19/2009 nonischemic cardiomyopathy...EF 25%...nondiagnostic MRI December 2009...  /  echo June, 2010 PLanned ICD / CRT... patient has seen Dr.Taygen Acklin... this will be placed when state insurance issues are in order LVH.. moderately severe... echo.. June, [id number] systolic heart failure...chronic Hypertension Mitral regurgitation...mild/ moderate...echo June, 2010 Aortic insufficiency mild...echo... June 2 010 80% upper left renal artery stenosis. right groin hematoma/abscess..repaired.. December, 2009 Anemia LBBB Obstructive sleep apnea arthritis of hips and knees...severe... requiring a walker Bradycardia    Past Surgical History: Last updated: 12/20/2008 hysterectomy surgical repair of a catheterization associated femoral artery injury  Family History: Last updated: 12/20/2008 negative for coronary artery disease  Social History: Last updated: 12/20/2008 Tobacco Use - No.  Alcohol Use - no Married no children  Vital Signs:  Patient profile:   70 year old female Height:      63 inches Weight:      151 pounds BMI:     26.85 Pulse rate:   56 / minute Pulse rhythm:   regular BP sitting:   160 / 90  (right arm) Cuff size:   regular  Vitals Entered By: Doug Sou CMA (April 18, 2009 2:34 PM)  Physical Exam  General:  The patient was alert  and oriented in no acute distress. HEENT Normal.  Neck veins were flat, carotids were brisk.  Lungs were clear.  Heart sounds were regular without murmurs or gallops.  Abdomen was soft with active bowel sounds. There is no clubbing cyanosis or edema. Skin Warm and dry    Impression & Recommendations:  Problem # 1:  CARDIOMYOPATHY, PRIMARY, EF 25% (ICD-425.4) echo is as noted  previously. The issue of septal hypertrophy has raised the possibility of hypertrophic cardiomyopathy. I reviewed this with Dr. Ron Parker. He will review the echo himself. This will have impacted as relates to family screening  We discussed the risks and benefits of CRT and ICD implatation, inclyuding but not limited to death, perforation, lead dislodgement and phrenic nerve stimulation as well as infection.  She understands agrees and we await the  medicare plicy decision re Madit-CRT indication  Problem # 2:  CHRONIC SYSTOLIC HEART FAILURE (123456) stable on her current medication  Problem # 3:  LBBB (ICD-426.3) stable Her updated medication list for this problem includes:    Lisinopril 40 Mg Tabs (Lisinopril) .[name]... Take 1 tablet by mouth two times a day    Isosorbide Mononitrate Cr 60 Mg Xr24h-tab (Isosorbide mononitrate) .[name]... Take one tablet by mouth daily    Carvedilol 6.25 Mg Tabs (Carvedilol) .[name]... Take 1 tablet by mouth two times a day    Aspirin 81 Mg Tbec (Aspirin) .[name]... Take one tablet by mouth daily    Ramipril 2.5 Mg Caps (Ramipril) .[name]... Take one capsule by mouth daily

## 2010-04-03 NOTE — Progress Notes (Signed)
Summary: Decrease HR at Primary MD visit   Phone Note Call from Patient Call back at Home Phone [phone]   Reason for Call: Referral Summary of Call: Pt called stating she saw her primary MD this morning. She saw Tarri Fuller, NP who s/w Dr. Woody Seller. She was seen for bad chest congestion. During this visit her HR was 48. She was instructed to take 1/2 of her former dose of carvedilol, monitor HR and notify Dr. Ron Parker.  Initial call taken by: Gurney Maxin, RN, BSN,  January 17, 2009 4:32 PM  Follow-up for Phone Call        This will be fine. Stay on this dose.  Lorenza Evangelist, MD, [facility]  January 18, 2009 9:41 AM     Appended Document: Decrease HR at Primary MD visit Pt notified. Pt verbalized understanding.

## 2010-04-03 NOTE — Progress Notes (Signed)
   Phone Note Outgoing Call   Call placed by: Devra Dopp, LPN,  September 14, 624THL 9:10 AM Call placed to: Patient Summary of Call: CALL PLACED TO PT TO INFORM HER ON DAY OF DEVICE IMPLANT TO HOLD FUROSEMIDE AND SPIRONOLACTONE.PT VERBALIZED UNDERSTANING AND HAD PLANNED TO DO SO  ON OWN. Initial call taken by: Devra Dopp, LPN,  September 14, 624THL 9:12 AM

## 2010-04-03 NOTE — Letter (Signed)
Summary: Appointment- Rescheduled  [facility] [place]. [address]   [place], [place] [address]   Phone: [phone]  Fax: [phone]     April 25, 2009 MRN: [id number]     Gina [name] [address] [place]  [address]     Dear [name],   Due to a change in our office schedule, your appointment on  March 18,2011 at  2:45  pm  must be changed.    Your new appointment is scheduled for March 21,2011 at 3:15  pm.  We look forward to participating in your health care needs.   Please contact us at the number listed above at your earliest convenience to reschedule this appointment if needed.    Sincerely,  Public relations account executive

## 2010-04-03 NOTE — Assessment & Plan Note (Signed)
Summary: to discuss madit-crt implant/mt      Allergies Added:   Primary Provider:  Jerene Bears, MD  CC:  to discuss madit-crt.  History of Present Illness: Gina [name] is seen in followup for consideration of CRT implantation for nonischemic cardiac myopathy with a very broad left bundle branch block and congestive symptoms that are mild but also probably exposure to which are limited by the fact that she walks with a walker. She denies endocrine problems chest pain or shortness of breath. There has been no intercurrent syncope   Current Medications (verified): 1)  Lisinopril 40 Mg Tabs (Lisinopril) .... Take 1 Tablet By Mouth Two Times A Day 2)  Isosorbide Mononitrate Cr 60 Mg Xr24h-Tab (Isosorbide Mononitrate) .... Take One Tablet By Mouth Daily 3)  Clonidine Hcl 0.1 Mg Tabs (Clonidine Hcl) .... Take One Tablet By Mouth Three Times A Day 4)  Furosemide 80 Mg Tabs (Furosemide) .... Take 1 &1/2 Tablet By Mouth Once A Day and As Needed For Swelling 5)  Hydralazine Hcl 50 Mg Tabs (Hydralazine Hcl) .... Take One Tablet By Mouth Three Times A Day 6)  Carvedilol 6.25 Mg Tabs (Carvedilol) .... Take 1 Tablet By Mouth Two Times A Day 7)  Potassium Chloride Crys Cr 20 Meq Cr-Tabs (Potassium Chloride Crys Cr) .... Take One Tablet By Mouth Daily 8)  Spironolactone 25 Mg Tabs (Spironolactone) .... Take 1 Tablet By Mouth Two Times A Day 9)  Aspirin 81 Mg Tbec (Aspirin) .... Take One Tablet By Mouth Daily 10)  Ferrous Sulfate 325 (65 Fe) Mg  Tabs (Ferrous Sulfate) .... Once Daily 11)  Vitamin E 400 Unit Caps (Vitamin E) .... Take 1 Tablet By Mouth Once A Day 12)  Vitamin C 500 Mg  Tabs (Ascorbic Acid) .... Once Daily 13)  Glucosamine-Chondroitin 750-600 Mg Tabs (Glucosamine-Chondroitin) .... Take 1 Tablet By Mouth Two Times A Day 14)  Ramipril 2.5 Mg Caps (Ramipril) .... Take One Capsule By Mouth Daily 15)  Calcium Carbonate-Vitamin D 600-400 Mg-Unit  Tabs (Calcium Carbonate-Vitamin D) .... Take 1  Tablet By Mouth Two Times A Day 16)  Alendronate Sodium 70 Mg Tabs (Alendronate Sodium) .... Take One By Mouth Weekly  Allergies (verified): 1)  Keflex  Past History:  Past Medical History: Last updated: 09/07/2009 nonischemic cardiomyopathy...EF 25%...nondiagnostic MRI December 2009...  /  echo June, 2010 Septal hypertrophy....echo....04/2009....,.decide if family to be screened PLanned ICD / CRT... patient has seen Dr.Klein... this will be placed when state insurance issues are in order LVH.. moderately severe... echo.. June, [id number] systolic heart failure...chronic Hypertension Mitral regurgitation...mild/ moderate...echo June, 2010 Aortic insufficiency mild...echo... June 2 010 80% upper left renal artery stenosis. right groin hematoma/abscess..repaired.. December, 2009 Anemia LBBB Obstructive sleep apnea arthritis of hips and knees...severe... requiring a walker Bradycardia    Past Surgical History: Last updated: 12/20/2008 hysterectomy surgical repair of a catheterization associated femoral artery injury  Family History: Last updated: 12/20/2008 negative for coronary artery disease  Social History: Last updated: 12/20/2008 Tobacco Use - No.  Alcohol Use - no Married no children  Vital Signs:  Patient profile:   70 year old female Height:      63 inches Weight:      158 pounds BMI:     28.09 Pulse rate:   61 / minute Pulse rhythm:   regular BP sitting:   142 / 74  (left arm) Cuff size:   regular  Vitals Entered By: Doug Sou CMA (November 02, 2009 3:56 PM)   Physical  Exam  General:  The patient was alert and oriented in no acute distress. HEENT Normal.  Neck veins were flat, carotids were brisk.  Lungs were clear.  Heart sounds were regular without murmurs or gallops.  Abdomen was soft with active bowel sounds. There is no clubbing cyanosis or edema. Skin Warm and dry    Impression & Recommendations:  Problem # 1:  CARDIOMYOPATHY, PRIMARY,  EF 25% (ICD-425.4) the patient has a nonischemic cardiomyopathy with a broad left bundle branch block and left axis deviation. As a woman with the above, she might be anticipated to be a super responder to CRT .  [facility] has recently improved the Medicare decision for CRT implantation. We have reviewed the potential benefits as well as the potential risks of the procedure including but not limited to death infection lead dislodgment and inappropriate shocks. I have discussed with her the possibility of becoming enrolled in the MADIT-R. IT trial. She will consider this with her husband and we will be in contact with her next week about this.  Problem # 2:  BRADYCARDIA (ICD-427.89)  she has bradycardia as well as first degree AV block. Resynchronization would help ameliorate these abnormalities as well. They may further allow up titration of her carvedilol  Problem # 3:  LBBB (ICD-426.3)  as above Her updated medication list for this problem includes:    Lisinopril 40 Mg Tabs (Lisinopril) .[name]... Take 1 tablet by mouth two times a day    Isosorbide Mononitrate Cr 60 Mg Xr24h-tab (Isosorbide mononitrate) .[name]... Take one tablet by mouth daily    Carvedilol 6.25 Mg Tabs (Carvedilol) .[name]... Take 1 tablet by mouth two times a day    Aspirin 81 Mg Tbec (Aspirin) .[name]... Take one tablet by mouth daily    Ramipril 2.5 Mg Caps (Ramipril) .[name]... Take one capsule by mouth daily  Her updated medication list for this problem includes:    Lisinopril 40 Mg Tabs (Lisinopril) .[name]... Take 1 tablet by mouth two times a day    Isosorbide Mononitrate Cr 60 Mg Xr24h-tab (Isosorbide mononitrate) .[name]... Take one tablet by mouth daily    Carvedilol 6.25 Mg Tabs (Carvedilol) .[name]... Take 1 tablet by mouth two times a day    Aspirin 81 Mg Tbec (Aspirin) .[name]... Take one tablet by mouth daily    Ramipril 2.5 Mg Caps (Ramipril) .[name]... Take one capsule by mouth daily

## 2010-04-05 NOTE — Progress Notes (Signed)
Summary: Cardiology Phone Note - Hypokalemia   Phone Note From Other Clinic   Summary of Call: received call from amy with [facility] lab.  ms. Gina [name] had bmet today and k is 2.9.  i called ms. Gina [name] and she said that she hadn't taken all of her rx kcl today.  i advised that she take 20meq now and repeat 75meq prior to bedtime.  she'll take 17meq tom. am and sees JK @ 9:45.  Will likely need to increase her daily dose to 42meq three times a day so long as she's on increased dose lasix. Initial call taken by: Theora Gianotti, ANP-BC,  February 13, 2010 6:14 PM

## 2010-04-05 NOTE — Assessment & Plan Note (Signed)
Summary: 1 WK F/U PER DR. KATZ-ADD AT THIS TIME-JM  Medications Added FUROSEMIDE 80 MG TABS (FUROSEMIDE) Take 1 1/2  tablet by mouth every morning and 1 tablet by mouth every evening. POTASSIUM CHLORIDE CRYS CR 20 MEQ CR-TABS (POTASSIUM CHLORIDE CRYS CR) Take two tablets by mouth two times a day      Allergies Added:   Visit Type:  Follow-up Primary Renardo Cheatum:  Jerene Bears, MD  CC:  CHF.  History of Present Illness: The patient is seen for followup of CHF.  I saw her last in the office December the, 2011.  At that time he was able to increase her diuretic dose.  Her renal function is stable and in fact her creatinine is slightly better.  Potassium had been 3.2 and she was given extra potassium up to 60 mEq a day.  Labs from yesterday revealed potassium down to 2.9 and she was given 60 extra milliequivalents yesterday.  Her weight at home is 163.5 pounds.  She says are dry his weight was 153 pounds.  She may again central body weight I believe we can push a little harder.  Her weight in our office today is 167 ferritin 166 at the time of the prior visit.  I did consider adding 2.5 mg of Norvasc at the last visit but I did not write the prescription.  I chosen not to do this today as a pushing her diuretic dose higher.  Preventive Screening-Counseling & Management  Alcohol-Tobacco     Smoking Status: quit     Year Quit: 1987  Current Medications (verified): 1)  Isosorbide Mononitrate Cr 60 Mg Xr24h-Tab (Isosorbide Mononitrate) .... Take One Tablet By Mouth Daily 2)  Clonidine Hcl 0.2 Mg Tabs (Clonidine Hcl) .... Take 1 Tablet By Mouth Three Times A Day 3)  Furosemide 80 Mg Tabs (Furosemide) .... Take One Tablet By Mouth Two Times A Day 4)  Hydralazine Hcl 50 Mg Tabs (Hydralazine Hcl) .... Take One Tablet By Mouth Three Times A Day 5)  Carvedilol 3.125 Mg Tabs (Carvedilol) .... Take 1 Tablet By Mouth Two Times A Day 6)  Potassium Chloride Crys Cr 20 Meq Cr-Tabs (Potassium Chloride Crys Cr)  .... Take One Tablet By Mouth Two Times A Day 7)  Aspirin 81 Mg Tbec (Aspirin) .... Take One Tablet By Mouth Daily 8)  Iron 65mg  .... Take 1 Tablet By Mouth Once A Day 9)  Vitamin E 400 Unit Caps (Vitamin E) .... Take 1 Tablet By Mouth Once A Day 10)  Vitamin C 500 Mg  Tabs (Ascorbic Acid) .... Once Daily 11)  Glucosamine-Chondroitin 750-600 Mg Tabs (Glucosamine-Chondroitin) .... Take 1 Tablet By Mouth Two Times A Day 12)  Ramipril 2.5 Mg Caps (Ramipril) .... Take One Capsule By Mouth Daily 13)  Calcium Carbonate-Vitamin D 600-400 Mg-Unit  Tabs (Calcium Carbonate-Vitamin D) .... Take 1 Tablet By Mouth Two Times A Day 14)  Alendronate Sodium 70 Mg Tabs (Alendronate Sodium) .... Take One By Mouth Weekly  Allergies (verified): 1)  Keflex  Comments:  Nurse/Medical Assistant: The patient's medication list and allergies were reviewed with the patient and were updated in the Medication and Allergy Lists.  Past History:  Past Medical History: .EF 25%...nondiagnostic MRI December 2009...  /  echo June, 2010  /   echo... December 08, 2009.... ejection fraction improved... EF 60% /  EF 30%... echo.... [place]... December 28, 2009 with CHF Cardiomyopathy nonischemic.... EF improved October, 2011 Septal hypertrophy....echo....04/2009....patient encouraged the family to be screened elsewhere PLanned  ICD / CRT... patient has seen Dr.Klein..EF improved October, 2011 LVH.. moderately severe... echo.. June, 2010  /  EF improved October, 2011.... cancel plans for ICD systolic heart failure...chronic Hypertension Mitral regurgitation...mild/ moderate...echo June, 2010  /   echo.. October, 2011.... mitral regurgitation improved Aortic insufficiency mild...echo... June 2 010 80% upper left renal artery stenosis. right groin hematoma/abscess..repaired.. December, 2009 Anemia   further workup needed... October, 2011.[name] LBBB Obstructive sleep apnea arthritis of hips and knees...severe... requiring a  walker Bradycardia Acute renal failure   hospitalized... December 08, 2009... improved with hydration in the hospital  ??? atrial fibrillation during hospitalization October, 2011 ???..    Review of Systems       Patient denies fever, chills, headache, sweats, rash, change in vision, change in hearing, chest pain, cough, nausea vomiting, urinary symptoms.  All other systems are reviewed and are negative.  Vital Signs:  Patient profile:   70 year old female Height:      63 inches Weight:      167 pounds Pulse rate:   60 / minute BP sitting:   167 / 80  (left arm) Cuff size:   regular  Vitals Entered By: Georgina Peer (February 14, 2010 9:44 AM)  Physical Exam  General:  patient looks good. Head:  head is atraumatic. Eyes:  she still has puffiness in her upper eyelids.  Some of this is chronic with some may still be related to volume. Neck:  no jugular venous attention. Chest Wall:  no chest wall tenderness. Lungs:  lungs are clear.  Respiratory effort is nonlabored. Heart:  cardiac exam reveals an S1-S2.  No clicks or significant murmurs. Abdomen:  abdomen is soft. Msk:  no musculoskeletal deformities. Extremities:  no peripheral edema. Skin:  no skin rashes. Psych:  patient is oriented to person time and place her affect is normal.   Impression & Recommendations:  Problem # 1:  * ??  ATRIAL FIBRILLATION  ??  Orders: EKG w/ Interpretation (93000) EKG is done today.  It is reviewed by me.  She is holding sinus rhythm.  Problem # 2:  * PLAN FOR ICD As we move over we will keep Then we probably will need to move towards an ICD.  She has left bundle branch block.  I'm hoping she'll be candidate for a biventricular pacemaker.  Problem # 3:  CHRONIC SYSTOLIC HEART FAILURE (123456) The patient is doing well.  Pushing her diuretic are her weight seems to be the same but her creatinine is down to 1.15.  She's been taking 80 mg of Lasix b.i.d. since the last visit.  Her dose  will be increased to 120 mg in the morning and 80 at night.  She'll have early followup labs.  We will communicate with her by telephone.  Problem # 4:  RENAL ARTERY STENOSIS (ICD-440.1) As some point we will reassess her renal artery stenosis.  He was not significant in the past.  Problem # 5:  HYPERTENSION (ICD-401.9)  Her updated medication list for this problem includes:    Clonidine Hcl 0.2 Mg Tabs (Clonidine hcl) .[name]... Take 1 tablet by mouth three times a day    Furosemide 80 Mg Tabs (Furosemide) .[name]... Take 1 1/2  tablet by mouth every morning and 1 tablet by mouth every evening.    Hydralazine Hcl 50 Mg Tabs (Hydralazine hcl) .[name]... Take one tablet by mouth three times a day    Carvedilol 3.125 Mg Tabs (Carvedilol) .[name]... Take 1 tablet by  mouth two times a day    Aspirin 81 Mg Tbec (Aspirin) .[name]... Take one tablet by mouth daily    Ramipril 2.5 Mg Caps (Ramipril) .[name]... Take one capsule by mouth daily I carefully consider adding Norvasc today.  I have still chosen not to his changing her diuretic doses.  I may change next week by telephone.  Very careful attention is being take to her renal status  Problem # 6:  * ACUTE RENAL FAILURE  OCTOBER, 2011 Overall renal status is stable.  We're watching her very careful.  Problem # 7:  HYPOKALEMIA (ICD-276.8)  The patient received extra 60 mEq potassium last night.  Potassium dose will be increased to 40 mg p.o. b.i.d.  We'll arrange for early followup labs  Orders: T-Basic Metabolic Panel ([id number])  Patient Instructions: 1)  Follow up with Dr. Ron Parker on Wed, April 11, 2010 at 10:15am. 2)  Your physician recommends that you go to the [facility] for lab work: [place]. 3)  Increase Potassium to 2 tablets two times a day. 4)  Increase Lasix (furosemide) to 1 1/2 tablets every morning and 1 tablet every evening. Prescriptions: FUROSEMIDE 80 MG TABS (FUROSEMIDE) Take 1 1/2  tablet by mouth every morning and 1 tablet by mouth  every evening.  #75 x 6   Entered by:   Gurney Maxin, RN, BSN   Authorized by:   Lorenza Evangelist, MD, [facility]   Signed by:   Gurney Maxin, RN, BSN on 02/14/2010   Method used:   Electronically to        [facility]. [address]* (retail)       [address] [place]  [address]       Ph: [phone] or [phone]       Fax: [phone]   RxID:   [id number] POTASSIUM CHLORIDE CRYS CR 20 MEQ CR-TABS (POTASSIUM CHLORIDE CRYS CR) Take two tablets by mouth two times a day  #120 x 6   Entered by:   Gurney Maxin, RN, BSN   Authorized by:   Lorenza Evangelist, MD, [facility]   Signed by:   Gurney Maxin, RN, BSN on 02/14/2010   Method used:   Electronically to        [facility]. [address]* (retail)       [address] [place]  [address]       Ph: [phone] or [phone]       Fax: [phone]   RxID:   [id number]

## 2010-04-11 ENCOUNTER — Ambulatory Visit (INDEPENDENT_AMBULATORY_CARE_PROVIDER_SITE_OTHER): Payer: Medicare Other | Admitting: Cardiology

## 2010-04-11 ENCOUNTER — Encounter: Payer: Self-pay | Admitting: Cardiology

## 2010-04-11 DIAGNOSIS — I428 Other cardiomyopathies: Secondary | ICD-10-CM

## 2010-04-19 NOTE — Assessment & Plan Note (Signed)
Summary: 6 WK F/U PER 12/14 OV-JM/TR  Medications Added CARVEDILOL 3.125 MG TABS (CARVEDILOL) Take 1 tablet by mouth two times a day GLUCOSAMINE-CHONDROITIN 750-600 MG TABS (GLUCOSAMINE-CHONDROITIN) Take 1 tablet by mouth daily RAMIPRIL 5 MG CAPS (RAMIPRIL) Take one capsule by mouth daily      Allergies Added:   Visit Type:  Follow-up Primary Provider:  Jerene Bears, MD  CC:  CHF.  History of Present Illness: The patient is seen for followup of CHF.  She had been in and out of the hospital.  Unfortunately she stabilized.  I saw her last February 14, 2010.  If in touch with her by telephone.  She is getting some real weight and his return to [employer].  Chemistry on February 19, 2010 revealed a BUN of 36 and creatinine 1.28.  Potassium is 3.9.  She's feeling relatively well.  Preventive Screening-Counseling & Management  Alcohol-Tobacco     Smoking Status: quit     Year Quit: 1987  Current Medications (verified): 1)  Isosorbide Mononitrate Cr 60 Mg Xr24h-Tab (Isosorbide Mononitrate) .... Take One Tablet By Mouth Daily 2)  Clonidine Hcl 0.2 Mg Tabs (Clonidine Hcl) .... Take 1 Tablet By Mouth Three Times A Day 3)  Furosemide 80 Mg Tabs (Furosemide) .... Take 1 1/2  Tablet By Mouth Every Morning and 1 Tablet By Mouth Every Evening. 4)  Hydralazine Hcl 50 Mg Tabs (Hydralazine Hcl) .... Take One Tablet By Mouth Three Times A Day 5)  Carvedilol 3.125 Mg Tabs (Carvedilol) .... Take 1 Tablet By Mouth Two Times A Day 6)  Potassium Chloride Crys Cr 20 Meq Cr-Tabs (Potassium Chloride Crys Cr) .... Take Two Tablets By Mouth Two Times A Day 7)  Aspirin 81 Mg Tbec (Aspirin) .... Take One Tablet By Mouth Daily 8)  Iron 65mg  .... Take 1 Tablet By Mouth Once A Day 9)  Vitamin E 400 Unit Caps (Vitamin E) .... Take 1 Tablet By Mouth Once A Day 10)  Vitamin C 500 Mg  Tabs (Ascorbic Acid) .... Once Daily 11)  Glucosamine-Chondroitin 750-600 Mg Tabs (Glucosamine-Chondroitin) .... Take 1 Tablet By  Mouth Daily 12)  Ramipril 2.5 Mg Caps (Ramipril) .... Take One Capsule By Mouth Daily 13)  Calcium Carbonate-Vitamin D 600-400 Mg-Unit  Tabs (Calcium Carbonate-Vitamin D) .... Take 1 Tablet By Mouth Two Times A Day  Allergies (verified): 1)  Keflex  Comments:  Nurse/Medical Assistant: The patient's medication list and allergies were reviewed with the patient and were updated in the Medication and Allergy Lists.  Past History:  Past Medical History: .EF 25%...nondiagnostic MRI December 2009...  /  echo June, 2010  /   echo... December 08, 2009.... ejection fraction improved... EF 60% /  EF 30%... echo.... [place]... December 28, 2009 with CHF Cardiomyopathy nonischemic.... EF improved October, 2011 Septal hypertrophy....echo....04/2009....patient encouraged the family to be screened elsewhere PLanned ICD / CRT... patient has seen Dr.Klein..EF improved October, 2011 LVH.. moderately severe... echo.. June, 2010  /  EF improved October, 2011.... cancel plans for ICD systolic heart failure...chronic Hypertension Mitral regurgitation...mild/ moderate...echo June, 2010  /   echo.. October, 2011.... mitral regurgitation improved Aortic insufficiency mild...echo... June 2 010 80% upper left renal artery stenosis. right groin hematoma/abscess..repaired.. December, 2009 Anemia   further workup needed... October, 2011.... LBBB Obstructive sleep apnea arthritis of hips and knees...severe... requiring a walker Bradycardia Acute renal failure   hospitalized... December 08, 2009... improved with hydration in the hospital  ??? atrial fibrillation during hospitalization October, 2011 ???Marland KitchenMarland Kitchen  Review of Systems       The patient denies fever, chills, headache, sweats, rash, change in vision, change in hearing, chest pain, cough, nausea vomiting, urinary symptoms.  All other systems are reviewed and are negative.  Vital Signs:  Patient profile:   70 year old female Height:      63  inches Weight:      166 pounds Pulse rate:   48 / minute BP sitting:   166 / 79  (left arm) Cuff size:   regular  Vitals Entered By: Georgina Peer (April 11, 2010 10:24 AM)  Physical Exam  General:  patient is stable today. Head:  head is atraumatic. Eyes:  no xanthelasma. Neck:  no jugular venous extension. Chest Wall:  no chest wall tenderness. Lungs:  lungs are clear.  Respiratory effort is not labored. Heart:  cardiac exam reveals S1 and S2.  No clicks or significant murmurs. Abdomen:  abdomen soft. Msk:  no musculoskeletal deformities. Extremities:  no peripheral edema. Skin:  no skin rashes. Psych:  patient is oriented to person time and place.  Affect is normal   Impression & Recommendations:  Problem # 1:  HYPOKALEMIA (ICD-276.8)  Potassium will be checked again today.  Orders: T-Basic Metabolic Panel (99991111)  Problem # 2:  * PLAN FOR ICD For now as the original plans for ICD on hold  Problem # 3:  BRADYCARDIA (ICD-427.89) The patient does have relative bradycardia.  I did not push her carvedilol dose higher.  Problem # 4:  CARDIOMYOPATHY, PRIMARY, EF 25% (ICD-425.4) I will continue to adjust her medications and then we will recheck another echo over time. I will see her for early followup.  Problem # 5:  HYPERTENSION (ICD-401.9) Blood pressure is elevated.  Ramipril dose will be increased.  Other Orders: EKG w/ Interpretation (93000)  Patient Instructions: 1)  Follow up with Dr. Ron Parker on  Tuesday, June 05, 2010 at 10am. 2)  Increase Ramipril to 5mg  by mouth once daily.  3)  Your physician recommends that you go to the [facility] for lab work: (BMET) Prescriptions: CARVEDILOL 3.125 MG TABS (CARVEDILOL) Take 1 tablet by mouth two times a day  #60 x 6   Entered by:   Gurney Maxin, RN, BSN   Authorized by:   Lorenza Evangelist, MD, [facility]   Signed by:   Gurney Maxin, RN, BSN on 04/11/2010   Method used:   Electronically to        [facility]. [address]* (retail)       [address] [place]  [address]       Ph: [phone] or [phone]       Fax: [phone]   RxID:   [id number] RAMIPRIL 5 MG CAPS (RAMIPRIL) Take one capsule by mouth daily  #30 x 6   Entered by:   Gurney Maxin, RN, BSN   Authorized by:   Lorenza Evangelist, MD, [facility]   Signed by:   Gurney Maxin, RN, BSN on 04/11/2010   Method used:   Electronically to        [facility]. [address] (retail)       [address] [place]  [address]       Ph: [phone] or [phone]       Fax: [phone]   RxID:   [id number]

## 2010-06-02 ENCOUNTER — Other Ambulatory Visit: Payer: Self-pay | Admitting: Cardiology

## 2010-06-04 ENCOUNTER — Encounter: Payer: Self-pay | Admitting: *Deleted

## 2010-06-04 DIAGNOSIS — R001 Bradycardia, unspecified: Secondary | ICD-10-CM | POA: Insufficient documentation

## 2010-06-04 DIAGNOSIS — G4733 Obstructive sleep apnea (adult) (pediatric): Secondary | ICD-10-CM | POA: Insufficient documentation

## 2010-06-04 DIAGNOSIS — I340 Nonrheumatic mitral (valve) insufficiency: Secondary | ICD-10-CM | POA: Insufficient documentation

## 2010-06-04 DIAGNOSIS — I447 Left bundle-branch block, unspecified: Secondary | ICD-10-CM | POA: Insufficient documentation

## 2010-06-04 DIAGNOSIS — I701 Atherosclerosis of renal artery: Secondary | ICD-10-CM | POA: Insufficient documentation

## 2010-06-04 DIAGNOSIS — I10 Essential (primary) hypertension: Secondary | ICD-10-CM | POA: Insufficient documentation

## 2010-06-04 DIAGNOSIS — D649 Anemia, unspecified: Secondary | ICD-10-CM | POA: Insufficient documentation

## 2010-06-04 DIAGNOSIS — I4891 Unspecified atrial fibrillation: Secondary | ICD-10-CM | POA: Insufficient documentation

## 2010-06-04 DIAGNOSIS — I428 Other cardiomyopathies: Secondary | ICD-10-CM | POA: Insufficient documentation

## 2010-06-04 DIAGNOSIS — I517 Cardiomegaly: Secondary | ICD-10-CM | POA: Insufficient documentation

## 2010-06-04 DIAGNOSIS — I422 Other hypertrophic cardiomyopathy: Secondary | ICD-10-CM | POA: Insufficient documentation

## 2010-06-04 DIAGNOSIS — D638 Anemia in other chronic diseases classified elsewhere: Secondary | ICD-10-CM | POA: Insufficient documentation

## 2010-06-05 ENCOUNTER — Encounter: Payer: Self-pay | Admitting: Cardiology

## 2010-06-05 ENCOUNTER — Ambulatory Visit (INDEPENDENT_AMBULATORY_CARE_PROVIDER_SITE_OTHER): Payer: Medicare Other | Admitting: Cardiology

## 2010-06-05 DIAGNOSIS — I10 Essential (primary) hypertension: Secondary | ICD-10-CM

## 2010-06-05 DIAGNOSIS — R001 Bradycardia, unspecified: Secondary | ICD-10-CM

## 2010-06-05 DIAGNOSIS — I428 Other cardiomyopathies: Secondary | ICD-10-CM

## 2010-06-05 DIAGNOSIS — Z79899 Other long term (current) drug therapy: Secondary | ICD-10-CM

## 2010-06-05 DIAGNOSIS — I498 Other specified cardiac arrhythmias: Secondary | ICD-10-CM

## 2010-06-05 DIAGNOSIS — D649 Anemia, unspecified: Secondary | ICD-10-CM

## 2010-06-05 MED ORDER — RAMIPRIL 10 MG PO CAPS: 10.0000 mg | ORAL_CAPSULE | Freq: Every day | ORAL | Status: DC

## 2010-06-05 NOTE — Assessment & Plan Note (Addendum)
It is time to check her hemoglobin again.  This was begun.

## 2010-06-05 NOTE — Patient Instructions (Signed)
   Increase Ramipril (Altace) to 10mg  by mouth daily. You may take 2 of your 5 mg tablets until gone and then start new prescription of 10mg  tablets. Your physician recommends that you go to the [facility] for lab work: CBC/BMET. Follow up as scheduled.

## 2010-06-05 NOTE — Assessment & Plan Note (Signed)
Heart rate is stable at 55.  We will not increase her beta blocker.

## 2010-06-05 NOTE — Assessment & Plan Note (Signed)
Date her echo will be repeated again.  In the past she was close to having an ICD in her LV function improved.

## 2010-06-05 NOTE — Progress Notes (Signed)
HPI Patient is seen in followup cardiomyopathy.  I saw her last 2 months ago.  Her ramipril dose was increased.  Her chemistry revealed a potassium of 3.4 but I decided not to adjust her meds as her ACE inhibitor dose had been increased.  She's feeling well.  She is trying to lose weight.  Her weight is holding steady. Allergies  Allergen Reactions  . Cephalexin     REACTION: Rash to arms/legs    Current Outpatient Prescriptions  Medication Sig Dispense Refill  . Ascorbic Acid (VITAMIN C) 500 MG tablet Take 500 mg by mouth daily.        [name] aspirin 81 MG tablet Take 81 mg by mouth daily.        . Calcium Carbonate-Vitamin D (CALCIUM 600/VITAMIN D) 600-400 MG-UNIT per tablet Take 1 tablet by mouth 2 (two) times daily.        . carvedilol (COREG) 3.125 MG tablet Take 3.125 mg by mouth 2 (two) times daily.        . cloNIDine (CATAPRES) 0.2 MG tablet Take 0.2 mg by mouth 3 (three) times daily.        . ferrous sulfate 325 (65 FE) MG tablet Take 325 mg by mouth daily.        . furosemide (LASIX) 80 MG tablet Take 1 1/2 tablet every morning and 1 tablet every evening.       . Glucosamine-Chondroitin 750-600 MG TABS Take 1 tablet by mouth daily.        . hydrALAZINE (APRESOLINE) 50 MG tablet TAKE ONE TABLET BY MOUTH THREE TIMES A DAY  90 tablet  6  . isosorbide mononitrate (IMDUR) 60 MG 24 hr tablet Take 60 mg by mouth daily.        . potassium chloride SA (K-DUR,KLOR-CON) 20 MEQ tablet Take 2 tablets by mouth two times a day.        . ramipril (ALTACE) 5 MG capsule Take 5 mg by mouth daily.        . vitamin E 400 UNIT capsule Take 400 Units by mouth daily.          History   Social History  . Marital Status: Married    Spouse Name: N/A    Number of Children: 0  . Years of Education: N/A   Occupational History  . Not on file.   Social History Main Topics  . Smoking status: Former Smoker    Quit date: 03/04/1985  . Smokeless tobacco: Not on file  . Alcohol Use: No  . Drug Use: Not on  file  . Sexually Active: Not on file   Other Topics Concern  . Not on file   Social History Narrative  . No narrative on file    No family history on file.  Past Medical History  Diagnosis Date  . Depressed left ventricular ejection fraction     EF 25%...nondiagnostic MRI December 2009...  /  echo June, 2010  /   echo... December 08, 2009.... ejection fraction improved... EF 60% /  EF 30%... echo.... [place]... December 28, 2009 with CHFPLanned ICD / CRT... patient has seen Dr.Klein..EF improved October, 2011   . Cardiomyopathy, nonischemic     EF improved October, 2011  . Asymmetric septal hypertrophy     echo....04/2009....patient encouraged the family to be screened elsewhere  . LVH (left ventricular hypertrophy)     moderately severe... echo.. June, 2010  /  EF improved October, 2011.... cancel plans for ICD  .  Systolic heart failure     chronic  . Hypertension   . Mitral regurgitation     mild/ moderate...echo June, 2010  /   echo.. October, 2011.... mitral regurgitation  improved  . Aortic insufficiency     mild...echo... June 2 010  . Renal artery stenosis     80% upper left   . Groin hematoma     Right-hematoma/abscess..repaired.. December, 2009  . Anemia     further workup needed... October, 2011.  [name] LBBB (left bundle branch block)   . OSA (obstructive sleep apnea)   . Arthritis     Hips/Knees, severe, requiring a walker  . Bradycardia   . Acute renal failure      hospitalized... December 08, 2009... improved with hydration in the hospital  . Atrial fibrillation      ??? atrial fibrillation during hospitalization October, 2011 ???..    Past Surgical History  Procedure Date  . Abdominal hysterectomy   . Surgical repair of a catheteriztion associated femoral artery injury     ROS  Patient denies fever, chills, headache, sweats, rash, change in vision, change in hearing, chest pain, cough, nausea vomiting, urinary symptoms.  All other systems are reviewed and  are negative. PHYSICAL EXAM She looks good today.  She is oriented to person time and place.  Affect is normal.  There is no xanthelasma.  She still has some drooping of her tissue above her eyelids.  There is no jugular venous distention.  Lungs are clear.  Respiratory effort is nonlabored.  Cardiac exam reveals S1 and S2.  There are no clicks or significant murmurs.  Her abdomen is soft.  There is no significant peripheral edema.  There no musculoskeletal deformities.  She does walk with a rolling walker. Filed Vitals:   06/05/10 1026  BP: 154/78  Pulse: 55  Height: 5\' 3"  (1.6 m)  Weight: 166 lb (75.297 kg)    EKG No EKG done today.  ASSESSMENT & PLAN

## 2010-06-05 NOTE — Assessment & Plan Note (Signed)
Blood pressure remains elevated.  Ramapril dose will be increased

## 2010-06-18 LAB — POCT I-STAT 4, (NA,K, GLUC, HGB,HCT)
Glucose, Bld: 102 mg/dL — ABNORMAL HIGH (ref 70–99)
HCT: 31 % — ABNORMAL LOW (ref 36.0–46.0)
Hemoglobin: 10.5 g/dL — ABNORMAL LOW (ref 12.0–15.0)
Potassium: 4.1 mEq/L (ref 3.5–5.1)
Sodium: 137 mEq/L (ref 135–145)

## 2010-06-18 LAB — WOUND CULTURE: Culture: NO GROWTH

## 2010-06-18 LAB — ANAEROBIC CULTURE

## 2010-07-12 ENCOUNTER — Ambulatory Visit (INDEPENDENT_AMBULATORY_CARE_PROVIDER_SITE_OTHER): Payer: Medicare Other | Admitting: Cardiology

## 2010-07-12 ENCOUNTER — Encounter: Payer: Self-pay | Admitting: Cardiology

## 2010-07-12 DIAGNOSIS — I498 Other specified cardiac arrhythmias: Secondary | ICD-10-CM

## 2010-07-12 DIAGNOSIS — D649 Anemia, unspecified: Secondary | ICD-10-CM

## 2010-07-12 DIAGNOSIS — I428 Other cardiomyopathies: Secondary | ICD-10-CM

## 2010-07-12 DIAGNOSIS — I10 Essential (primary) hypertension: Secondary | ICD-10-CM

## 2010-07-12 NOTE — Assessment & Plan Note (Signed)
She is relatively bradycardic today.  She is not having any symptoms.  I would like to keep her on the current dose of carvedilol.  No change in therapy.

## 2010-07-12 NOTE — Assessment & Plan Note (Signed)
Overall cardiomyopathy and CHF are stable.  I've chosen not to repeat an echo at this time.  I will see her back in 10 weeks.

## 2010-07-12 NOTE — Assessment & Plan Note (Signed)
Hemoglobin was checked at the time of her last visit.  It was stable at greater than 11.

## 2010-07-12 NOTE — Assessment & Plan Note (Signed)
Blood pressure is now under good control.  No further adjustment in her meds today.

## 2010-07-12 NOTE — Patient Instructions (Signed)
Follow up as scheduled. Your physician recommends that you continue on your current medications as directed. Please refer to the Current Medication list given to you today.

## 2010-07-12 NOTE — Progress Notes (Signed)
HPI Patient is seen for followup congestive heart failure.  She is actually doing very well today.  I saw her last June 05, 2010.  At that time her ramapril dose was increased.  She is tolerating this.  Chemistry was checked at that time and her BUN was in the range of 44 with creatinine 1.3.  This is stable for her.  She's not having any chest pain shortness of breath syncope or presyncope. Allergies  Allergen Reactions  . Cephalexin     REACTION: Rash to arms/legs    Current Outpatient Prescriptions  Medication Sig Dispense Refill  . Ascorbic Acid (VITAMIN C) 500 MG tablet Take 500 mg by mouth daily.        [name] aspirin 81 MG tablet Take 81 mg by mouth daily.        . Calcium Carbonate-Vitamin D (CALCIUM 600/VITAMIN D) 600-400 MG-UNIT per tablet Take 1 tablet by mouth 2 (two) times daily.        . carvedilol (COREG) 3.125 MG tablet Take 3.125 mg by mouth 2 (two) times daily.        . cloNIDine (CATAPRES) 0.2 MG tablet Take 0.2 mg by mouth 3 (three) times daily.        . ferrous sulfate 325 (65 FE) MG tablet Take 325 mg by mouth daily.        . furosemide (LASIX) 80 MG tablet Take 1 1/2 tablet every morning and 1 tablet every evening.       . Glucosamine-Chondroitin 750-600 MG TABS Take 1 tablet by mouth daily.        . hydrALAZINE (APRESOLINE) 50 MG tablet TAKE ONE TABLET BY MOUTH THREE TIMES A DAY  90 tablet  6  . isosorbide mononitrate (IMDUR) 60 MG 24 hr tablet Take 60 mg by mouth daily.        . Multiple Vitamins-Minerals (OCUVITE ADULT 50+) CAPS Take 1 capsule by mouth daily.        . potassium chloride SA (K-DUR,KLOR-CON) 20 MEQ tablet Take 2 tablets by mouth two times a day.        . ramipril (ALTACE) 10 MG capsule Take 1 capsule (10 mg total) by mouth daily.  30 capsule  6  . vitamin E 400 UNIT capsule Take 400 Units by mouth daily.          History   Social History  . Marital Status: Married    Spouse Name: N/A    Number of Children: 0  . Years of Education: N/A    Occupational History  . Not on file.   Social History Main Topics  . Smoking status: Former Smoker -- 1.0 packs/day for 24 years    Types: Cigarettes    Quit date: 12/02/1985  . Smokeless tobacco: Not on file  . Alcohol Use: No  . Drug Use: Not on file  . Sexually Active: Not on file   Other Topics Concern  . Not on file   Social History Narrative  . No narrative on file    No family history on file.  Past Medical History  Diagnosis Date  . Depressed left ventricular ejection fraction     EF 25%...nondiagnostic MRI December 2009...  /  echo June, 2010  /   echo... December 08, 2009.... ejection fraction improved... EF 60% /  EF 30%... echo.... [place]... December 28, 2009 with CHFPLanned ICD / CRT... patient has seen Dr.Klein..EF improved October, 2011   . Cardiomyopathy, nonischemic  EF improved October, 2011  . Asymmetric septal hypertrophy     echo....04/2009....patient encouraged the family to be screened elsewhere  . LVH (left ventricular hypertrophy)     moderately severe... echo.. June, 2010  /  EF improved October, 2011.... cancel plans for ICD  . Systolic heart failure     chronic  . Hypertension   . Mitral regurgitation     mild/ moderate...echo June, 2010  /   echo.. October, 2011.... mitral regurgitation  improved  . Aortic insufficiency     mild...echo... June 2 010  . Renal artery stenosis     80% upper left   . Groin hematoma     Right-hematoma/abscess..repaired.. December, 2009  . Anemia     further workup needed... October, 2011.  [name] LBBB (left bundle branch block)   . OSA (obstructive sleep apnea)   . Arthritis     Hips/Knees, severe, requiring a walker  . Bradycardia   . Acute renal failure      hospitalized... December 08, 2009... improved with hydration in the hospital  . Atrial fibrillation      ??? atrial fibrillation during hospitalization October, 2011 ???..    Past Surgical History  Procedure Date  . Abdominal hysterectomy   .  Surgical repair of a catheteriztion associated femoral artery injury     ROS  Patient denies fever, chills, headache, sweats, rash, ch.urinary symptoms.  All other systems are reviewed and are negativeange in vision, change in hearing, chest pain, cough, nausea vomiting, Urinary symptoms.  All other systems are reviewed and are negative.  PHYSICAL EXAM Patient comes in with her walker.  She is oriented to person time and place.  Affect is normal.  Head is atraumatic.  There is no xanthelasma.  There is no jugular venous distention.  Lungs are clear.  Respiratory effort is nonlabored.  Cardiac exam reveals S1 and S2.  No clicks or significant murmurs.  The abdomen is soft there is no peripheral edema. Filed Vitals:   07/12/10 1013  BP: 137/69  Pulse: 47  Height: 5\' 3"  (1.6 m)  Weight: 166 lb (75.297 kg)    EKG    EKG is done today and reviewed by me.  Her sinus bradycardia.  There is old left bundle branch block.  There is no significant change other than the bradycardia. ASSESSMENT & PLAN

## 2010-07-17 NOTE — Op Note (Signed)
Gina [name], Gina [name] NO.:  [id number]   MEDICAL RECORD NO.:  [id number]          PATIENT TYPE:  OIB   LOCATION:  2034                         FACILITY:  [facility]   PHYSICIAN:  Judeth Cornfield. Scot Dock, M.D.DATE OF BIRTH:  1941/02/06   DATE OF PROCEDURE:  03/25/2008  DATE OF DISCHARGE:                               OPERATIVE REPORT   PREOPERATIVE DIAGNOSIS:  Lymphocele of the right groin.   POSTOPERATIVE DIAGNOSIS:  Abscess of the right groin.   PROCEDURES:  Incision and drainage of right groin abscess.   SURGEON:  Judeth Cornfield. Scot Dock, MD   ASSISTANT:  Nurse.   ANESTHESIA:  General.   TECHNIQUE:  The patient was taken to the operating room and received a  general anesthetic.  The right groin was prepped and draped in the usual  sterile fashion.  An oblique incision was made through the area of  swelling and the swelling was entered.  This turned out to be pus and  was not a lymphocele.  Aerobic and anaerobic cultures were obtained.  The wound was irrigated with copious amounts of saline and all the  purulent material evacuated.  Hemostasis was obtained using  electrocautery.  The wound was then packed with moistened Kerlix.  Of  note, there was no significant lymphatic drainage or evidence of  hematoma.  This was all just purulent material, likely related to fat  necrosis.  The patient tolerated the procedure well and was transferred  to the recovery room in satisfactory condition.  All needle and sponge  counts were correct.      Judeth Cornfield. Scot Dock, M.D.  Electronically Signed     CSD/MEDQ  D:  03/25/2008  T:  03/26/2008  Job:  [id number]

## 2010-07-17 NOTE — Assessment & Plan Note (Signed)
OFFICE VISIT   REENIE, KOY  DOB:  [birth year]                                       05/31/2008  CHART#:[id number]   I saw the patient in the office today for continued followup of her  right groin wound.  She had developed a hematoma of the right groin  after cardiac catheterization and underwent evacuation of this and  repair of the artery.  She later developed a lymphocele but in fact at  the time of exploration was found to have a sterile abscess.  This was  evacuated and she has been having dressing changes.  She comes in for  routine wound visit.   On examination at this point the wound has essentially completely  healed.  There was one area of granulation tissue which I applied silver  nitrate to.  She will keep the wound clean and dry.  I will see her back  p.r.n.   Judeth Cornfield. Scot Dock, M.D.  Electronically Signed   CSD/MEDQ  D:  05/31/2008  T:  06/01/2008  Job:  [id number]

## 2010-07-17 NOTE — Assessment & Plan Note (Signed)
[facility] OFFICE NOTE   [name], Gina                   MRN:          [id number]  DATE:08/17/2008                            DOB:          1940/08/09    PRIMARY CARDIOLOGIST:  Carlena Bjornstad, MD, [facility]   REASON FOR VISIT:  Scheduled followup.   HISTORY OF PRESENT ILLNESS:  Gina [name] continues to do extremely well  from a cardiovascular standpoint, having lost 7 pounds since her last  visit.  She is compliant with her medications, including CPAP, following  recent diagnosis of sleep apnea.  She closely monitors her fluid and  salt intake.  Although she is unable to climb a flight of stairs  secondary to severe arthritis of the knees, she denies any significant  exertional dyspnea, and denies PND, orthopnea, or worsening lower  extremity edema.  She also denies any chest pain or tachy palpitations.   She does report moderately elevated blood pressure readings at home, in  the [id number] systolic range.  She did take her medications before today's  scheduled visit.   CURRENT MEDICATIONS:  1. Hydralazine 50 t.i.d.  2. Spironolactone 25 daily.  3. Isosorbide 60 daily.  4. Clonidine 0.1 mg q. a.m./0.2 mg nightly.  5. Lasix 80 daily.  6. Carvedilol 12.5 b.i.d.  7. Lisinopril 20 daily.  8. KCl 40 daily.  9. Iron 65 daily.  10.Aspirin 81 daily.   PHYSICAL EXAMINATION:  VITAL SIGNS:  Blood pressure 164/85, pulse of 63,  regular weight 163 (down 7).  GENERAL:  A 70 year old female, moderately obese, sitting upright, no  distress.  HEENT:  Normocephalic, atraumatic.  NECK:  Palpable carotid pulses without bruits; no JVD at 90 degrees.  LUNGS:  Clear to auscultation in all fields.  HEART:  Regular rate and rhythm.  No significant murmurs.  No rubs.  ABDOMEN:  Benign.  EXTREMITIES:  Chronic lower extremity edema (left greater than right),  with venous stasis changes.  NEUROLOGY:  Alert and oriented.   IMPRESSION:  1. Severe nonischemic cardiomyopathy.      a.     EF 25%; global hypokinesis.      b.     Nondiagnostic cardiac MRI, December 2009.  2. Chronic systolic heart failure, euvolemic.  3. Hypertension, uncontrolled.  4. Normocytic anemia, stable.  5. Peripheral arterial disease.      a.     80% ostial, upper left renal artery stenosis.  6. Valvular heart disease.      a.     Moderate MR/mild AR.  7. History of pericardial effusion.      a.     No evidence of tamponade by followup echo, January 2010.  8. OSA, on CPAP.  9. Chronic LBBB.  10.History of hypokalemia.   PLAN:  1. Increase lisinopril to 40 mg daily, for better blood pressure      control.  2. Followup CMET, with a BNP level, in 1 week.  3. Surveillance 2-D echocardiogram for reassessment of LV function.      If no significant improvement, then we will need to consider ICD  with/without CRT.  4. We will request formal report regarding her prior urine and protein      electrophoresis panel at [facility], last December.  This was so as      to exclude cardiac amyloidosis.  5. Schedule return clinic followup with myself and Dr. Ron Parker in 3      months.      Mannie Stabile, PA-C  Electronically Signed      Carlena Bjornstad, MD, [facility]  Electronically Signed   GS/MedQ  DD: 08/17/2008  DT: 08/18/2008  Job #: [id number]   cc:   Wynelle Cleveland

## 2010-07-17 NOTE — Assessment & Plan Note (Signed)
[facility] OFFICE NOTE   Gina [name], Gina [name]                         MRN:          [id number]  DATE:02/22/2008                            DOB:          January 06, 1941    I had seen the patient in consultation at [facility] on February 11, 2008.  Lovie Macadamia number is [id number].  She is Dr. Oneita Jolly patient.  She had an  unexpected decrease in left ventricular function.  She had left bundle  branch block.  We brought her to [facility] for further evaluation.  While at [facility], she underwent cath by Dr. [name].  Unfortunately,  she had a delayed groin bleed at approximately 36 hours.  This became  quite substantial and eventually she had to have surgical repair of her  right femoral artery.  This was done and she is stable and she may be  going home today.  Dr. Aundra Dubin was also worried that she could possibly  have amyloid.  Her serum protein electrophoresis did not show this  definitively.  Other blood studies are pending.  Cardiac MRI was not  definitive.   PROBLEMS:  #1.  Left bundle branch block.  #2.  Severe left ventricular dysfunction, 25% to 30%.  Exact etiology  remains unclear but most likely hypertensive.  We will continue to keep  amyloid in mind.  #3.  Moderately severe left ventricular hypertrophy.  #4.  Mild MR.  #5.  Acute systolic heart failure which is improving.  #6.  Severe hypertension, improving.  #7.  Low iron but other iron studies did not appear to show an iron-  deficiency anemia as far as I could tell.  #8.  Forty thousand E. coli in her urine but not 100,000.  #9.  She has need for surgery to her knees and her hip.  #10.  Obesity.  She lost 70 pounds on her own with [employer].  #11.  Small pericardial effusion although it was felt after further  review that she might have mild hemodynamic effects from this but not  severe.  She is stable.  The etiology of her cardiomyopathy remains  unclear.  It is either amyloid or a HOCUM with asymmetrical septic  hypertrophy.  However, there is no SAM and no outflow tract grading.  There was difficulty in nulling the myocardium on the MRI.  Difficulty  with nulling goes along with amyloid, as does pericardial and pleural  effusions.  EKG is normal voltage though left bundle branch block.  SPEP  is probably negative.  Immunofixation recommended to completely rule out  an M-spike.  We will be treating her for LV dysfunction.  For now, we  may consider endomyocardial biopsy in the future.  I will be seeing her  back in [place].  Her potassium was low at one point but was treated.  #12.  Blood loss anemia.  As of today, her hemoglobin is 9.3.  #13.  Mild nonobstructive coronary disease.  Her catheterization did  show mild disease.  #14.  A 70% left renal artery stenosis in 1/2 arteries to  her left  kidney.  I do not believe this would be the etiology of hypertension but  we will keep this in mind.  #15.  Left bundle branch block.  #16.  Status post vascular surgery for repair of right common femoral  artery.  #17.  Pericardial effusion of borderline hemodynamic significance.  She  will need a followup  echo early after the hospitalization but not immediately.  #18.  Mild mitral regurgitation.     Carlena Bjornstad, MD, [facility]  Electronically Signed    JDK/MedQ  DD: 02/22/2008  DT: 02/22/2008  Job #: [id number]

## 2010-07-17 NOTE — Assessment & Plan Note (Signed)
[facility]                          [facility] OFFICE NOTE   Gina [name], Gina [name]                   MRN:          [id number]  DATE:06/24/2008                            DOB:          04/08/1940    Gina [name] is doing very well.  She has recently seen Dr. Rhae [name] who  arranged for sleep analysis and has determined that she does have sleep  apnea.  Last night was her second night using CPAP.  The patient is very  motivated and she continues to improve and move forward on all fronts.  She has significant left ventricular dysfunction.   She is not having any chest pain.  She has not had any significant  shortness of breath.   ALLERGIES:  KEFLEX.   MEDICATIONS:  See the flow sheet.   REVIEW OF SYSTEMS:  She has no fevers or chills or skin rashes.  There  is no headache.  There is no change in her vision or her hearing.  She  has no cough.  There is no shortness of breath.  There is no chest pain.  She has no GI or GU symptoms.  She does not have any peripheral edema at  this time.  All other systems are reviewed and are negative.   PHYSICAL EXAMINATION:  VITAL SIGNS:  Weight is 170 pounds, which is  decreased 1.4 pounds since her last visit.  Blood pressure today is  155/88.  In Dr. Oneita Jolly office yesterday it was 138/78 according to the  patient.  GENERAL:  She is oriented to person, time, and place.  Affect is normal.  HEENT:  No xanthelasma.  She has normal extraocular motion.  NECK:  There are no carotid bruits.  There is no jugular venous  distention.  LUNGS:  Clear.  Respiratory effort is not labored.  CARDIAC:  S1 with an S2.  There are no clicks or significant murmurs.  ABDOMEN:  Soft.  EXTREMITIES:  She has no significant peripheral edema today.   No labs were done today.   Problems are listed with an extensive list on the note of May 11, 2008.  We will continue to watch her LV function.  I am hoping that her  pressure will  improve even more with sleep apnea.  I have chosen not to  change her cardiac meds as of today.  Over time, we will continue to  follow her and eventually do a followup 2-D echo to see if an ICD or a  biventricular pacer needs to be considered.  This decision will not be  made until all of her systems are stable and until we are sure that she  has received optimal medical therapy for prolonged period of time.   Additional problem to her problem list is sleep apnea.  She is using  CPAP.     Carlena Bjornstad, MD, [facility]  Electronically Signed    JDK/MedQ  DD: 06/24/2008  DT: 06/24/2008  Job #: [id number]   cc:   Wynelle Cleveland

## 2010-07-17 NOTE — Assessment & Plan Note (Signed)
[facility] OFFICE NOTE   TROYLYNN, MALOY                   MRN:          [id number]  DATE:05/11/2008                            DOB:          1940-07-14    PRIMARY CARDIOLOGIST:  Carlena Bjornstad, MD, [facility].   REASON FOR VISIT:  Scheduled followup.   Since last seen in the clinic, Ms. Kiesow was briefly hospitalized here  at [facility] for acute/chronic systolic heart failure.  She also  presented with accelerated hypertension.  She was referred to our team  in consultation and we did raise the question of whether or not she  would be a suitable candidate for ICD implantation, given her severe  cardiomyopathy and underlying left bundle-branch block.   Clinically, Ms. Manca appears to be doing much better.  She has lost 16  pounds since her last office visit with [place].  Her blood pressure appears  to be better controlled, as well.  Medications have since been adjusted,  with uptitration of hydralazine.  Of note, I had increased her  carvedilol to 18.75 b.i.d. when I last saw her in the clinic.  She is  currently on 12.5 b.i.d.  I had also recommended stopping her  supplemental potassium, given that I increased her aldactone to 25  daily.  She currently remains on supplemental potassium, as well.   Recent blood work from March 2, notable for BUN 39, creatinine 1.1,  sodium 141, and potassium 3.9.   CURRENT MEDICATIONS:  1. Hydralazine 50 t.i.d.  2. Spironolactone 25 daily,.  3. Furosemide 80 daily.  4. Clonidine 0.1 b.i.d.  5. Isosorbide 60 daily.  6. KCl 20 b.i.d.  7. Aspirin 81 daily.  8. Supplemental iron 65 daily.  9. Lisinopril 20 daily.  10.Carvedilol 12.5 b.i.d.   PHYSICAL EXAMINATION:  VITAL SIGNS:  Blood pressure 155/81, pulse 57,  regular, weight 171 (down 16).  Room air sat 97%.  GENERAL:  A 70 year old female, sitting upright, no distress.  HEENT:  Normocephalic, atraumatic.  NECK:   No JVD.  LUNGS:  Clear to auscultation in all fields.  HEART:  Regular rhythm.  No significant murmurs.  ABDOMEN:  Benign.  EXTREMITIES:  No significant edema.  NEURO:  No focal deficit.   IMPRESSION:  1. Chronic systolic heart failure.  2. Severe nonischemic cardiomyopathy.      a.     EF 25%; global hypokinesis.  3. Hypertension.  4. Moderate mitral regurgitation.  5. Peripheral arterial disease.      a.     History of 80% ostial, upper left renal artery stenosis.  6. Chronic low back pain.  7. Normocytic anemia, heme negative.  8. History of right groin hematoma/abscess.      a.     Status post repair, December 2009.   PLAN:  1. Continue current medication regimen and close clinical followup.  2. Follow up BMET and BMP level.  3. Consider discontinuing supplemental potassium, pending review of      her blood work, given that she is on Aldactone.  4. The patient is instructed to maintain regular scheduled  followup      with Dr. Deitra Mayo in [place], for ongoing monitoring      of her right groin abscess and surgical repair.  5. We will arrange early clinic followup with myself and Dr. Ron Parker in      the following 6-8 weeks.  We will continue to closely monitor and      reassess indication for proceeding with a biventricular      defibrillator.      Mannie Stabile, PA-C  Electronically Signed      Carlena Bjornstad, MD, [facility]  Electronically Signed   GS/MedQ  DD: 05/11/2008  DT: 05/12/2008  Job #: [id number]   cc:   Wynelle Cleveland

## 2010-07-17 NOTE — Op Note (Signed)
[name], [name] NO.:  [id number]   MEDICAL RECORD NO.:  [id number]          PATIENT TYPE:  INP   LOCATION:  2922                         FACILITY:  [facility]   PHYSICIAN:  Judeth Cornfield. Scot Dock, M.D.DATE OF BIRTH:  07-27-1940   DATE OF PROCEDURE:  02/18/2008  DATE OF DISCHARGE:                               OPERATIVE REPORT   PREOPERATIVE DIAGNOSIS:  Hematoma in the right groin status post cardiac  catheterization.   POSTOPERATIVE DIAGNOSIS:  Hematoma in the right groin status post  cardiac catheterization.   PROCEDURE:  Evacuation of hematoma in the right groin and repair of  right common femoral artery.   SURGEON:  Judeth Cornfield. Scot Dock, MD   ASSISTANT:  Chad Cordial, PA   ANESTHESIA:  General.   INDICATIONS:  This is a 70 year old woman who had undergone cardiac  catheterization 2 days ago.  She had gone down for a procedure in  Radiology and developed a sudden onset of pain in the right groin and  developed a hematoma in the right groin.  Vascular Surgery was  consulted, and given the compromise of the overlying skin, an  exploration was recommended.   TECHNIQUE:  The patient was taken to the operating room and received a  general anesthetic.  An oblique incision was made above the inguinal  crease, and through this incision, the dissection was carried down to  the common femoral artery above the cannulation site just below the  inguinal ligament.  The artery here was controlled.  I then continued  the dissection distally in the single puncture site in the anterior wall  of the common femoral artery, at the very distal end of the common  femoral artery was identified and this was repaired with a horizontal  mattress 6-0 Prolene suture.  Next, the hematoma was entered in the  proximal thigh and a large amount of old clot was evacuated.  The wound  was irrigated with copious amounts of saline.  Two large 19 Blake drains  were placed.   Hemostasis was obtained in the wound, and the wound was  closed with deep layer of 2-0 Vicryl, the subcutaneous layer with 2-0  Vicryl, and the skin closed with 4-0 subcuticular stitch.  A sterile  dressing was applied.  The patient tolerated the procedure well and was  transferred to the recovery room in satisfactory condition.  All needle  and sponge counts were correct.      Judeth Cornfield. Scot Dock, M.D.  Electronically Signed     CSD/MEDQ  D:  02/18/2008  T:  02/19/2008  Job:  [id number]   cc:   [name], MD

## 2010-07-17 NOTE — Assessment & Plan Note (Signed)
OFFICE VISIT   DAN, MAMON  DOB:  04-30-1940                                       04/19/2008  CHART#:[id number]   I saw the patient in the office today for continued followup of her  right groin wound.  She had developed a delayed hematoma in her right  groin after cardiac catheterization and I had addressed this in the  operating room.  She later returned with what I thought was a  lymphocele, however, it turned out it was simply an abscess which was  sterile.  She was admitted and had dressing changes and returns for her  first outpatient visit.  Her husband has been doing the dressing changes  along with home health.   PHYSICAL EXAMINATION:  On examination the wound has now almost  completely healed.  There is only one small area about 0.5 cm in width  which is open and has minimal drainage.  There is no significant  erythema.  We will have her continue her dressing changes until this is  completely healed.  I plan on seeing her back in 6 weeks.  There is no  evidence of lymphocele.   Judeth Cornfield. Scot Dock, M.D.  Electronically Signed   CSD/MEDQ  D:  04/19/2008  T:  04/20/2008  Job:  [id number]

## 2010-07-17 NOTE — Consult Note (Signed)
[name], Gina [name] NO.:  [id number]   MEDICAL RECORD NO.:  [id number]          PATIENT TYPE:  INP   LOCATION:                               FACILITY:  [facility]   PHYSICIAN:  Judeth Cornfield. Scot Dock, M.D.DATE OF BIRTH:  03-13-1940   DATE OF CONSULTATION:  DATE OF DISCHARGE:                                 CONSULTATION   REASON FOR CONSULTATION:  Hematoma in the right groin.   HISTORY:  This is a pleasant 70 year old woman who was admitted on  February 16, 2008, with congestive heart failure.  She underwent cardiac  catheterization via right femoral approach on February 16, 2008.  She  had nonobstructive coronary artery disease and ejection fraction  estimated at 25% with global hypokinesis which was felt to be a  nonischemic cardiomyopathy.  She initially had no problems with the  catheterization; however, she went for a procedure in Radiology the  following day yesterday and developed fairly sudden onset of pain in the  right groin and developed significant right groin hematoma.  This has  enlarged, and Vascular Surgery was consulted as there was this large  hematoma to be compromising the overlying skin.  Prior to her admission,  she was ambulatory with a walker, but I did not get any history of  claudication, rest pain, or nonhealing ulcers.   Past medical history is significant for:  1. Congestive heart failure with an ejection fraction estimated at      25%.  2. Hypertension.   REVIEW OF SYSTEMS:  She has had no fever or chills.  She has had no  chest pain today.   On physical examination, this is a pleasant 70 year old woman who  appears her stated age.  She is moderately obese.  Temperature is 98,  blood pressure 158/71, heart rate is 65.  Saturation 98%.  Lungs are  clear bilaterally to auscultation.  On cardiac exam, she has a regular  rate and rhythm.  Her abdomen is soft and nontender.  She has a large  hematoma in the right proximal thigh medially  with compromise of the  overlying skin which is significantly ecchymotic with blistering.  She  has a palpable femoral, dorsalis pedis, and posterior tibial pulse.  Her  hemoglobin is 9.1, hematocrit 27.3.  White blood cell count 7.9,  platelets 187,000.   This patient presents with a sudden onset of hematoma in the right groin  which is quite large and is compromising the overlying skin.  I have  recommended evacuation of the hematoma to relieve pressure on the skin  and will also be sure there is no evidence of active bleeding.  I have  explained the risk of the procedure to include 30% risk of wound healing  problems.  Potential complications also include bleeding and infection.  All of her questions were answered, and she is agreeable to proceed.      Judeth Cornfield. Scot Dock, M.D.  Electronically Signed    CSD/MEDQ  D:  02/18/2008  T:  02/18/2008  Job:  [id number]   cc:   [name], MD

## 2010-07-17 NOTE — Discharge Summary (Signed)
NAMEMERLIE, MOUSE NO.:  [id number]   MEDICAL RECORD NO.:  [id number]          PATIENT TYPE:  OIB   LOCATION:  2034                         FACILITY:  [facility]   PHYSICIAN:  Judeth Cornfield. Scot Dock, M.D.DATE OF BIRTH:  1940-06-09   DATE OF ADMISSION:  03/25/2008  DATE OF DISCHARGE:  03/28/2008                               DISCHARGE SUMMARY   REASON FOR ADMISSION:  Abscess in the right groin.   HISTORY:  This is a pleasant 70 year old woman who had undergone cardiac  catheterization via right femoral approach.  She developed delayed  hematoma the right groin and was taken to the operating room on April 21, 2007, and underwent evacuation of hematoma of the right groin and  repair of the right common femoral artery.  She had done well  postoperatively, but returned with significant swelling in the right  groin.  It was felt that she most likely had a lymphocele.  She was  admitted for incision and drainage of this.  The remainder of her  history and physical is as dictated without addition or deletion.   HOSPITAL COURSE:  The patient was admitted on March 25, 2008.  She  underwent incision and drainage of her right groin wound.  This turned  out to be an abscess and not lymphocele.  She was admitted for dressing  changes, which were done b.i.d., wet-to-dry with normal saline.  The  culture ultimately did not grow any bacteria.  She had been on p.o.  Cipro.  By postoperative day #3, it was felt she was ready for discharge  to home with instructions with the home health nurse to do the dressing  changes b.i.d.  Hopefully, the husband could learn to one of these  dressing changes each day.   DISCHARGE DIAGNOSIS:  Abscess of the right groin.   SECONDARY DIAGNOSES:  1. Congestive heart failure.  2. Hypertension.   DISCHARGE MEDICATIONS:  1. VESIcare 5 mg p.o. daily.  2. Lasix 40 mg p.o. daily.  3. Hydralazine 25 mg p.o. t.i.d.  4. Carvedilol 12.5 mg p.o.  b.i.d.  5. Lisinopril 20 mg p.o. daily.  6. Klor-Con 10 mEq p.o. daily.  7. Spironolactone 25 mg half a tab p.o. daily.  8. Iron 65 mg p.o. daily.  9. Aspirin 81 mg p.o. daily.   Discharge is to home.  Followup is in 1 week.  Dressing changes will be  hydrogel with wet-to-dry, 2 x 2s with normal saline, covered by dry  gauze and tape.      Judeth Cornfield. Scot Dock, M.D.  Electronically Signed     CSD/MEDQ  D:  03/28/2008  T:  03/28/2008  Job:  [id number]   cc:   [name], MD

## 2010-07-17 NOTE — Assessment & Plan Note (Signed)
[facility]                          [facility] OFFICE NOTE   Gina [name], Gina [name]                   MRN:          [id number]  DATE:04/22/2008                            DOB:          20-Dec-1940    PRIMARY CARDIOLOGIST:  Gina Mcmurray, MD, [facility]   REASON FOR VISIT:  Schedule 1-week followup.  Please refer to Dr. Kae [name]  recent note of February 12, for complete details.   Gina [name] reports that the previous recommendation to increase her total  dose of Lasix to 80 mg daily, was not effective in suppressing her  volume overload.  She contacted our office on Monday, reporting a 3  pound weight gain over the previous 3 days.  I thus advised that her  Lasix be increased to 80 mg b.i.d., with concomitant increase in K-Dur  to 20 mEq b.i.d.  Followup blood work was obtained, showing stable renal  function and a mild decrease in BNP level from 3700 to 3100.   Clinically, she reported improvement in her weights with a decrease from  a peak of 187.5 to a current reading of 182.4, earlier this morning.  Moreover, she feels that this current dose of Lasix is clearly working  much better for her, citing improvement in her dyspnea.  She is also  sleeping much better.   CURRENT MEDICATIONS:  1. Lasix 80 mg b.i.d.  2. K-Dur 20 mEq b.i.d.  3. Aspirin 81 daily.  4. Spironolactone 12.5 daily.  5. Lisinopril 20 daily.  6. Carvedilol 12.5 b.i.d.  7. Hydralazine 25 t.i.d.  8. Imdur 30 daily.   RECENT LABORATORY DATA:  BNP 3100, BUN 32, creatinine 0.98, and  potassium 3.4, compared to previous potassium of 3.9, BUN 27, and  creatinine 1.0.   PHYSICAL EXAMINATION:  VITAL SIGNS:  Blood pressure 156/82, pulse 68,  respirations 20, weight 182.4, at home.  GENERAL:  A 70 year old female sitting upright in no distress.  HEENT:  Normocephalic, atraumatic.  NECK:  Palpable carotid pulses; no JVD.  LUNGS:  Clear to auscultation in all fields.  HEART:  Regular  rate and rhythm.  No significant murmurs.  No rubs.  ABDOMEN:  Benign.  EXTREMITIES:  Trace - 1+ pedal edema.  NEURO:  No focal deficit.   IMPRESSION:  1. Volume overload.      a.     Improved with increased diuretic regimen.  2. Severe nonischemic cardiomyopathy.      a.     Ejection fraction of 25%; global hypokinesis.  3. Right groin hematoma/abscess.      a.     Status post repair, December 2009.  4. Valvular heart disease.      a.     Mild aortic regurgitation, moderate mitral regurgitation.  5. Hypertension.  6. Peripheral arterial disease.      a.     A 80% ostial upper left renal artery stenosis.  7. Chronic systolic heart failure.  8. Left bundle branch block.  9. Normocytic anemia.   PLAN:  1. Continue current high dose of diuretic regimen, given marked      improvement, both  clinically and with respect to documented      decrease in daily rates.  2. Medication adjustment with uptitration of Coreg to 18.75 b.i.d. and      Aldactone to 25 mg daily.  In doing so, we will stop supplemental      potassium.  3. Surveillance of blood work with a followup CMET and BNP level to be      drawn next Tuesday, with results forwarded to Dr. Ron [name].  4. Followup of previously noted anemia with a CBC, iron profile, and      ferritin level.  5. Schedule early clinic followup with myself and Dr. Ron [name] in 1 month.      Gina Serpe, PA-C       Gina Mcmurray, MD,FACC    GS/MedQ  DD: 04/22/2008  DT: 04/23/2008  Job #: [id number]   cc:   Gina [name]

## 2010-07-17 NOTE — Assessment & Plan Note (Signed)
OFFICE VISIT   JOELEE, Gina [name]  DOB:  01-Sep-1940                                       03/15/2008  CHART#:[id number]   I saw the patient in the office today for followup after she had  evacuation of hematoma in the right groin and repair of the right common  femoral artery on 02/18/2008.  She had undergone previous heart cath  there.  She comes in the office for first outpatient visit and has a  swelling in her left groin with some mild cellulitis.  This appears to  be a large lymphocele and I tried to drain this in the office today but  really could not get too much fluid off.  The fluid looks somewhat  murky.  I suggested that we I and D this in the operating room and keep  her in the hospital for dressing changes and convert her to a VAC.  However, she is very reluctant to proceed with this at this time and so  she is agreeable to try a week of antibiotics in hopes that this will  gradually improve.  I will see her back next Wednesday.  If this has  progressed I think she will need admission for I and D and dressing  changes.   Judeth Cornfield. Scot Dock, M.D.  Electronically Signed   CSD/MEDQ  D:  03/15/2008  T:  03/16/2008  Job:  [id number]

## 2010-07-17 NOTE — Assessment & Plan Note (Signed)
[facility]                          [facility]   Gina [name], Gina                   MRN:          [id number]  DATE:04/15/2008                            DOB:          [birth year]    Ms. Gina [name] is seen for cardiology followup.  I saw her last on March 23, 2008.  Since that time she has been hospitalized by Dr. Gae Gallop.  There was question of a lymphocele but ultimately she says  that there was an abscess pocket.  Fortunately, it was drained and she  is doing well.  She is getting home health care following her along and  the bandaging is going well.   She did have increasing shortness of breath.  Ultimately we called in  for her to take an extra 80 of Lasix last night and 80 this morning.  She has diuresed 3 or 4 pounds.  She is more stable with this.  She is  still above her dry weight.  I will be adjusting her medications up.   The patient's BNP remains significantly elevated.  On April 14, 2008  her BUN was 27 and creatinine 1.0.  Potassium was 3.9.  This was the  result of having added potassium from April 12, 2008.   PAST MEDICAL HISTORY:   ALLERGIES:  KEFLEX.   MEDICATIONS:  See the flow sheet in the chart.   REVIEW OF SYSTEMS:  She is not having any fevers or chills.  She has no  headaches.  She has no dental problems.  There are no skin rashes.  She  does have urinary frequency.  This is being treated with medications.  Otherwise review of systems is negative.   PHYSICAL EXAM:  Weight today on our scale is 187 pounds.  On her scale  at home she is still several pounds above her baseline weight.  Blood  pressure is 172/92.  Pulse is 69.  The patient is oriented to person,  time and place.  Affect is normal.  HEENT:  Reveals no xanthelasma.  She has normal extraocular motion.  There are no carotid bruits.  There is no jugular venous distention.  LUNGS:  Clear.  Respiratory effort is not labored.  CARDIAC EXAM:  Reveals and S1 and S2.  There are no clicks or  significant murmurs.  ABDOMEN:  Soft.  She has 1+ peripheral edema.   Problems are listed extensively on my note of March 23, 2008.   Status post hematoma post catheterization with right groin repair by Dr.  Scot Dock and large lymphocele that was followed up and a recent drainage  of this area which did have some abscess:  She is much better.   Number 4 and 5:  Severe left ventricular dysfunction with an ejection  fraction of 25%.  She is on appropriate meds.  I will try to dose her  medicines higher.  She is hypertensive today.  Her lisinopril dose will  be increased today from 20-40.   The patient is stable.  However, she did have the increasing volume at  home.  Some of this  is dietary indiscretion.  I have spoken with her  about this.  Her Lasix dose is to be increased to 80 mg.  She will  experiment at home to see if it works better for her to take 80 in the  morning or to take 40 twice a day.  This will be done.  I will arrange  for follow-up labs within 1 week.  Also I cannot be in the office here  in [place] for the next few weeks and I will arrange for followup with my  team here.  We need to keep a close eye on her to try to stabilize her  volume status and continue to titrate her meds for her LV dysfunction.     Carlena Bjornstad, MD, [facility]  Electronically Signed    JDK/MedQ  DD: 04/15/2008  DT: 04/15/2008  Job #: [id number]

## 2010-07-17 NOTE — Discharge Summary (Signed)
NAMEARICIA, GLEBA NO.:  [id number]   MEDICAL RECORD NO.:  [id number]          PATIENT TYPE:  INP   LOCATION:  2025                         FACILITY:  [facility]   PHYSICIAN:  Carlena Bjornstad, MD, FACCDATE OF BIRTH:  11-18-1940   DATE OF ADMISSION:  02/16/2008  DATE OF DISCHARGE:  02/23/2008                               DISCHARGE SUMMARY   PRIMARY CARDIOLOGIST:  Carlena Bjornstad, MD, [facility]   DISCHARGING DIAGNOSES:  1. Congestive heart failure secondary to nonischemic cardiomyopathy      status post cardiac catheterization, 2-D echocardiogram on this      admission with ejection fraction of 25-30%.  2. Right groin hematoma status post vascular surgery for groin bleed      with plans for home health, wound care for further management of      right groin.  Post hospitalization, the patient is to follow up      with Dr. Doren Custard in 2-3 weeks.  Dr. Mee Hives office is to call the      patient at home with date and time.  3. Nonobstructive coronary artery disease with ejection fraction of      25% with global hypokinesis, felt to be nonischemic cardiomyopathy.  4. Severe hypertension, poorly controlled.  5. Anemia (normocytic with a iron level of 7).  6. Left bundle branch block.  7. Pericardial effusion, confirmed by echocardiogram on this admission      with plans to repeat outpatient echocardiogram in 2 weeks.  This      will be arranged through the [facility], to be done at [facility].  8. Moderate/severe left ventricular hypertrophy.  9. Mild mitral regurgitation.  10.Orthopedic conditions requiring pending hip and knee surgery.  11.An 80% ostial stenosis of the upper left renal artery, note the      patient has 2 right and 2 left renal arteries, confirmed by cardiac      catheterization and selective renal angiography.   PROCEDURES THIS ADMISSION:  1. Cardiac catheterization on February 16, 2008.  2. Surgical repair of hematoma of the right groin  status post cardiac      catheterization on February 18, 2008.  3. MR, cardiac morphology without and with contrast on February 19, 2008.  4. A 2-D echocardiogram on February 19, 2008.   CONSULTATION ON THIS ADMISSION:  Dr. Doren Custard on February 18, 2008.   HOSPITAL COURSE:  Ms. Herbold is a 70 year old Caucasian female who  initially presented to [facility], diagnosed with congestive  heart failure, transferred to [facility] for further evaluation,  underwent cardiac catheterization, found to have nonischemic  cardiomyopathy.  Two days post cardiac catheterization, she developed  sudden onset of pain in the right groin and developed a hematoma.  Vascular Surgery was consulted.  Exploration was recommended.  The  patient was taken to OR on February 18, 2008.  Hematoma was entered in  the proximal thigh, large amount of old clot was evacuated, wound was  irrigated, and 2 Blake drains were placed.  The patient  tolerated the  procedure without complications.  The patient did have episodes of  hypotension, found to be anemic with a hemoglobin drifting down to 8.5  postoperatively.  The patient was transfused 1 unit of packed RBCs due  to symptomatic anemia.  Adjustments made in medications.  Drains to  right groin were discontinued.  Dressing changes manage per Vascular  Surgery.  Home health arranged to follow up wound care as an outpatient.  The patient was also noted to have episodes of hypokalemia, supplemented  with p.o. potassium.  The patient continued to do well and stable enough  to be discharged home on February 23, 2008, after evaluated by Dr. Ron Parker  and Drucie Opitz.  At the time of discharge, the patient will follow up  with Dr. Doren Custard in the office in 2-3 weeks.  Office will call the patient  to arrange followup appointment.  Followup 2-D echocardiogram for  further evaluation of pericardial effusion, will be done at [facility] [place] will call the patient  to arrange echo in 2  weeks and then follow up with Dr. Ron Parker on March 23, 2008, at 2:45.  Home health will be managed through [facility].   LABORATORY WORK AT THE TIME OF DISCHARGE:  Potassium 3.3 and has been  treated.  BUN 29, creatinine 1.09.  H&H, 9 and 26.4 respectively,  platelet count 240,000.  Iron panel; total iron 18, total iron binding  capacity 185, percent sat 10, and UIBCs are 167.   MEDICATIONS AT THE TIME OF DISCHARGE:  1. Aspirin 81 mg.  2. Isosorbide 30 mg.  3. Carvedilol 12.5 b.i.d.  4. Hydralazine 25 mg t.i.d.  5. Spironolactone 25 mg daily.  6. KCl 10 mEq daily.  7. Iron 325 daily.  8. Furosemide 40 daily.  9. Lisinopril 20 daily.  10.VESIcare 5 mg daily.  11.Silvadene cream to right groin and dry gauze dressing to right      groin per Home Health.   The patient is instructed to stop her Procardia, labetalol, Trental, and  Naprosyn.   DURATION OF DISCHARGE ENCOUNTER:  Over 30 minutes.      Rosanne Sack, ACNP      Carlena Bjornstad, MD, [facility]  Electronically Signed    MB/MEDQ  D:  02/23/2008  T:  02/24/2008  Job:  [id number]

## 2010-07-17 NOTE — Cardiovascular Report (Signed)
Gina [name], Gina [name] NO.:  [id number]   MEDICAL RECORD NO.:  [id number]          PATIENT TYPE:  INP   LOCATION:  2922                         FACILITY:  [facility]   PHYSICIAN:  [name], MD      DATE OF BIRTH:  January 13, 1941   DATE OF PROCEDURE:  DATE OF DISCHARGE:                            CARDIAC CATHETERIZATION   PRIMARY CARDIOLOGIST:  Dola Argyle, MD, [facility].   PROCEDURE:  1. Left heart catheterization.  2. Right heart catheterization.  3. Coronary angiography.  4. Left ventriculography.  5. Abdominal aortography.  6. Selective left renal artery angiogram.   INDICATIONS:  Cardiomyopathy of uncertain etiology with congestive heart  failure.  The patient does have a history of longstanding hypertension.   PROCEDURE NOTE:  After informed consent was obtained, the patient was  sterilely prepped and draped.  1% lidocaine was used locally to  anesthetize the right groin area.  The right common femoral vein was  accessed using Seldinger technique and a 7-French venous sheath was  placed in the vein.  Next, the right common femoral artery was accessed  using Seldinger technique and a 6-French arterial sheath was placed in  the artery.  A right heart catheterization was carried out using a Swan-  Ganz catheter.  Two samples were obtained from the pulmonary artery for  saturation measurement and a sample was obtained from the aorta for  saturation measurement.  The left coronary artery was engaged using the  6-French JL-4 catheter.  The right coronary artery was engaged using the  6-French JR4 catheter.  The left ventricle was entered using the 6-  [place] pigtail.  Using the 6-French pigtail catheter, ventriculography  was carried out.  Next, the pigtail catheter was positioned in the  abdominal aorta at approximately the level of vertebra L1 and non-  selective abdominal angiography was performed.  This did appear to show  left upper renal artery stenosis and so  selective renal angiography was  carried out using the 6-French JR-4 catheter.  There was a small  hematoma at the end of the procedure and pressure was applied.   FINDINGS:  Hemodynamics:  Mean right atrial pressure was 13 mmHg.  RV  43/9, PA 47/28 with mean PA pressure of 37 mmHg.  Mean pulmonary  capillary wedge pressure of 23 mmHg.  Aorta 181/90.  Left ventricle  177/17/26.  Mixed venous O2 saturation was 65%, aortic saturation 92%,  SVR was 1355, cardiac output was 6.55 liters per minute.  Cardiac index  was 3.45.  Left ventriculography:  Left ventricle was enlarged.  There was global  severe hypokinesis with an estimated ejection fraction of 25%.  Coronary angiography:  The right coronary artery was dominant and showed  only luminal irregularities.  Left main coronary artery was free from  significant disease.  The circumflex was a large vessel, there were 3  obtuse marginal vessels with a 30% ostial stenosis of the first obtuse  marginal.  There was a 30% stenosis in the proximal circumflex between  the first and second obtuse marginals.  The LAD system actually was  a  dual LAD type system, with a large diagonal that reached the apex.  There is a mild 20% stenosis, just prior to the bifurcation of the large  first diagonal off the LAD.  The remainder of the LAD did not have  significant disease.  There were luminal irregularities in the large  first diagonal.  This vessel reached the apex.  Non-selective abdominal  aortography did show 2 right and 2 left renal arteries, there appeared  to be an 80% ostial stenosis of the upper left renal artery.  This was  confirmed by a selective renal angiography.   ASSESSMENT AND PLAN:  There is a non-obstructive coronary artery disease  with elevated left and right heart filling pressures.  There is left  upper renal artery stenosis.  LV ejection fraction is 25% with global  hypokinesis, this appears to be a non-ischemic cardiomyopathy.   Given  the findings of severe left ventricular hypertrophy on echo, the  differential diagnosis includes hypertensive cardiomyopathy versus  cardiac amyloidosis.   PLAN:  Currently will be to control blood pressure.  We will start her  on nitroglycerin drip 50 mcg and then titrate up.  She is on a low dose  Coreg.  We will have her on lisinopril.  We will diurese her, beginning  later this afternoon, on Lasix 40 mg IV q.8h.  We will check an SPEP and  a UPEP for any evidence of AL amyloidosis.  We can consider a cardiac  MRI tomorrow to assess for infiltrative disease.      [name], MD  Electronically Signed     DM/MEDQ  D:  02/16/2008  T:  02/17/2008  Job:  [id number]   cc:   Carlena Bjornstad, MD, [facility]

## 2010-07-17 NOTE — Assessment & Plan Note (Signed)
[facility]                          [facility] NOTE   WINNI, KAMSTRA                   MRN:          [id number]  DATE:03/23/2008                            DOB:          10/01/1940    Gina [name] is seen for office followup after complicated course relative  to her cardiomyopathy.  I saw her first on February 16, 2008.  She was  seen at [facility] and she had congestive heart failure.  She had severe  longstanding hypertension.  Her echo revealed severe left ventricular  dysfunction.  The etiology of her left ventricular dysfunction at that  time was not clear.  The patient, therefore, was transferred to [facility] for cardiac catheterization.  At [facility], the patient underwent  cardiac cath.  The study was done by Dr. [name].  She had  nonobstructive coronary disease.  There was elevated left and right  heart filling pressures.  She has renal stenosis of the left upper renal  artery with two arteries.  Ejection fraction was 25%.  Diagnosis  included hypertensive cardiomyopathy or cardiac amyloid.  The patient  did have plans to have SPEP and a UPEP and cardiac MRI.   The patient then had a spontaneous groin bleed that was quite  significant.  She ultimately underwent surgery for that.  An MRI was  done.  The study is nondiagnostic.  SPEP and UPEP were done, but I do  not have the full explanation of these results at this time.  After  hospitalization, the patient was followed up by Vascular Surgery.  She  had problems with an ongoing lymphocele and this is to be drained this  coming Friday.  She is in good spirits overall.   ALLERGIES:  KEFLEX.   MEDICATIONS:  1. Imdur 30.  2. Vesicare 5.  3. Furosemide 40.  4. Hydralazine 25 t.i.d.  5. Carvedilol 12.5 b.i.d.  6. Lisinopril 20.  7. Potassium 10.  8. Spironolactone 12.5.  9. Iron.  10.Aspirin.  11.Cipro.   OTHER MEDICAL PROBLEMS:  See the complete list below.   REVIEW OF SYSTEMS:  The patient is feeling better.  When she first left  the hospital, she felt that her leg was heavy.  This continues to  improve.  She has an excellent understanding of her problems and has a  very good attitude despite the multiple problems that she has had.  She  is not having any GI or GU symptoms.  She is not having any fevers or  chills or skin rashes.  Her review of systems otherwise is negative.   PHYSICAL EXAMINATION:  VITAL SIGNS:  Blood pressure today is 160/90 with  a heart rate of 68.  She insists that her blood pressure is under much  better control at home.  She is on a multitude of medicines.  I am not  inclined to change her meds as of today.  HEENT:  Reveals no xanthelasma.  She has normal extraocular motion.  GENERAL:  She is significantly overweight.  NECK:  She has no jugular venous distention.  There are no carotid  bruits.  LUNGS:  Clear.  Respiratory effort is not labored.  CARDIAC:  S1 and S2.  There are no clicks or significant murmurs.  ABDOMEN:  Obese.  I did not examine her groin.  EXTREMITIES:  She has trace residual peripheral edema.   EKG reveals sinus rhythm with left bundle-branch block.   Problems include,  1. Significant hematoma post cath in the right groin with repair of      the right common femoral artery, February 18, 2008.  2. Large lymphocele that persisted after surgery with plans to have      this drained by Dr. Scot Dock later this week.  3. Left bundle-branch block.  4. Severe left ventricular dysfunction.  5. Ejection fraction 25%.  Etiology remains unclear but most likely      hypertensive.  We have to continue to review the data to rule out      amyloid.  The patient did have a cardiac MRI dated February 19, 2008.  There was difficulty with nulling of the myocardium for      delayed enhancement imaging, and this is suggestive of amyloid but      can also be seen with hypertensive cardiomyopathy.  SPEP and UPEP       are still pending in terms of a complete analysis.  6. Severe left ventricular hypertrophy.  7. Mild mitral regurgitation.  8. Acute systolic heart failure.  This is how she presented in [place]      and this has definitely improved.  9. Severe hypertension.  This is now well treated on multiple      medications and we will continue to watch this carefully.  10.Low iron, but iron studies did not show iron-deficiency anemia as      far as I could tell.  11.Ultimately, she hopes to have surgery for her knees and hips.  12.Obesity.  She lost 70 pounds on Weight Watchers in the past, but      she is still markedly obese.  13.Small pericardial effusion.  She has had a followup echo done in      the past few weeks showing a small effusion that can be followed.  14.History of blood loss anemia.  15.Mild nonobstructive coronary disease by cath.  16.A 70-80% stenosis of one of the two arteries to her left kidney.      This can be followed.   The patient is stable.  She is to have her lymphocele drained.  I will  then see her for followup to follow all the issues above.     Carlena Bjornstad, MD, [facility]  Electronically Signed    JDK/MedQ  DD: 03/23/2008  DT: 03/24/2008  Job #: [id number]

## 2010-07-17 NOTE — H&P (Signed)
Gina [name], Gina [name] NO.:  [id number]   MEDICAL RECORD NO.:  [id number]           PATIENT TYPE:   LOCATION:                                 FACILITY:   PHYSICIAN:  Judeth Cornfield. Scot Dock, M.D.DATE OF BIRTH:  1940/07/08   DATE OF ADMISSION:  03/25/2008  DATE OF DISCHARGE:                              HISTORY & PHYSICAL   REASON FOR ADMISSION:  Lymphocele in the right groin.   HISTORY:  This is a pleasant 70 year old woman who had undergone cardiac  catheterization via right femoral approach.  She developed a delayed  hematoma in the right groin and was taken to the operating room on  April 21, 2007, and underwent evacuation of hematoma in the right  groin and repair of the right common femoral artery.  She did well  postoperatively and was discharged.  I saw her last week in followup  when she had a significant lymphocele in the right groin.  We had talked  about elective admission and incision and drainage of this, however, she  wanted to follow this and was reluctant to consider surgery.  We had  attempted aspiration in the office, but this really did not yield  significant fluid, as I think the lymphocele was quite loculated.  She  returned to the office today and continues to have significant swelling  in the right groin and is now agreeable to incision and drainage of  this.   Her past medical history is significant for:  1. Congestive heart failure with a known ejection fraction of      approximately 25%.  2. In addition, she has hypertension.  3. She denies any history of diabetes, hypercholesterolemia, history      of previous myocardial infarction or history of COPD.   FAMILY HISTORY:  There is no history of premature cardiovascular  disease.   SOCIAL HISTORY:  She is married.  She quit tobacco in 1987.   ALLERGIES:  KEFLEX.   MEDICATIONS:  1. Isosorbide 30 mg p.o. daily.  2. VESIcare 5 mg p.o. daily.  3. Lasix 40 mg p.o. daily.  4.  Hydralazine 25 mg p.o. t.i.d.  5. Carvedilol 12.5 mg p.o. b.i.d.  6. Lisinopril 20 mg p.o. daily.  7. Klor-Con 10 mEq p.o. daily.  8. Spironolactone 25 mg half a tab p.o. daily.  9. Iron 65 mg p.o. daily.  10.Aspirin 81 mg p.o. daily.   REVIEW OF SYSTEMS:  GENERAL:  She has had no recent weight loss, weight  gain, or problems with her appetite.  She is 185 pounds.  CARDIAC:  She  has had no chest pain, chest pressure, palpitations, or arrhythmias.  PULMONARY:  She has had no productive cough, bronchitis, asthma, or  wheezing.  GI:  She has had no recent change in her bowel habits and has  no history of peptic ulcer disease.  GU:  She has had no dysuria or  frequency.  VASCULAR:  She has had no claudication, rest pain, or  nonhealing ulcers.  She has had no history of stroke, DVT, or phlebitis.  NEURO:  She has had no dizziness, blackouts, headaches, or seizures.  ORTHO:  She does have a history of arthritis.  PSYCHIATRIC, ENT, AND  HEMATOLOGIC:  Unremarkable.   On physical examination, this is a pleasant 70 year old woman who  appears her stated age.  Her blood pressure is 183/88, heart rate is 68,  temperature is 99.  Neck is supple.  There is no cervical  lymphadenopathy.  I did not detect any carotid bruits.  Lungs are clear  bilaterally to auscultation.  On cardiac exam, she has a regular rate  and rhythm.  Her abdomen is soft and nontender.  She has a large  swelling in her right groin consistent with a lymphocele.  She has  palpable left femoral pulse and palpable dorsalis pedis and posterior  tibial pulses bilaterally.  She has mild bilateral lower extremity  swelling.   IMPRESSION:  This patient presents with a lymphocele in her right groin  after evacuation of hematoma and repair of right common femoral artery.  We will admit her on Friday, March 25, 2008, and plan incision and  drainage of this.  She could potentially require placement of a VAC  versus dressing changes.   We will make further recommendations pending  the results of her intraoperative findings.  She will need to be  admitted after the procedure for wound care.      Judeth Cornfield. Scot Dock, M.D.  Electronically Signed     CSD/MEDQ  D:  03/23/2008  T:  03/23/2008  Job:  [id number]   cc:   [name], MD

## 2010-07-20 NOTE — Letter (Signed)
January 09, 2010    Amanda Pea, MD  [address] [place] [address]   RE:  KALANI, SCHWITTERS  MRN:  [id number]  /  DOB:  Jan 29, 1941   Dear Barnabas Lister,   This letter is regarding Margaretann Loveless.  The initial evaluation had  demonstrated impaired left ventricular function.  Preoperatively, she  was found to have significant anemia, and she was further evaluated by  Dr. Dannielle Burn in [place].  As part of that process, repeat LV function  assessment was done, it had normalized.  Plus she is no longer a  candidate for an ICD.  If you have any questions, please do not hesitate  to call me.    Sincerely,      Deboraha Sprang, MD, [facility]    SCK/MedQ  DD: 01/09/2010  DT: 01/10/2010  Job #: [id number]   CC:    Joan Flores, RN

## 2010-09-14 ENCOUNTER — Ambulatory Visit: Payer: Medicare Other | Admitting: Cardiology

## 2010-09-22 ENCOUNTER — Other Ambulatory Visit: Payer: Self-pay | Admitting: Cardiology

## 2010-10-03 ENCOUNTER — Encounter: Payer: Self-pay | Admitting: Cardiology

## 2010-10-03 ENCOUNTER — Ambulatory Visit (INDEPENDENT_AMBULATORY_CARE_PROVIDER_SITE_OTHER): Payer: Medicare Other | Admitting: Cardiology

## 2010-10-03 VITALS — BP 149/74 | HR 50 | Ht 63.0 in | Wt 170.0 lb

## 2010-10-03 DIAGNOSIS — E876 Hypokalemia: Secondary | ICD-10-CM

## 2010-10-03 DIAGNOSIS — R001 Bradycardia, unspecified: Secondary | ICD-10-CM

## 2010-10-03 DIAGNOSIS — I080 Rheumatic disorders of both mitral and aortic valves: Secondary | ICD-10-CM

## 2010-10-03 DIAGNOSIS — D649 Anemia, unspecified: Secondary | ICD-10-CM

## 2010-10-03 DIAGNOSIS — I498 Other specified cardiac arrhythmias: Secondary | ICD-10-CM

## 2010-10-03 DIAGNOSIS — I428 Other cardiomyopathies: Secondary | ICD-10-CM

## 2010-10-03 DIAGNOSIS — I5020 Unspecified systolic (congestive) heart failure: Secondary | ICD-10-CM

## 2010-10-03 DIAGNOSIS — I10 Essential (primary) hypertension: Secondary | ICD-10-CM

## 2010-10-03 DIAGNOSIS — I4891 Unspecified atrial fibrillation: Secondary | ICD-10-CM

## 2010-10-03 NOTE — Assessment & Plan Note (Signed)
Blood pressure is reasonably controlled for her at this time.  No change in therapy.

## 2010-10-03 NOTE — Assessment & Plan Note (Signed)
Mitral regurgitation has been mild.  This will be reassessed with 2-D echo.

## 2010-10-03 NOTE — Assessment & Plan Note (Signed)
Heart rate is stable but I cannot push her carvedilol dose any higher.

## 2010-10-03 NOTE — Assessment & Plan Note (Signed)
Her hemoglobin was checked at the time of her last visit and is stable.  No further workup is needed.

## 2010-10-03 NOTE — Assessment & Plan Note (Signed)
Chemistry will be checked again to be sure that her potassium is running normal.

## 2010-10-03 NOTE — Assessment & Plan Note (Signed)
Her rhythm is regular.  We have not seen any signs of recurrent atrial fibrillation.

## 2010-10-03 NOTE — Assessment & Plan Note (Signed)
I have decided not to change any of her medications at this time.  I will continue to review them and each visit.

## 2010-10-03 NOTE — Assessment & Plan Note (Signed)
Her volume status is controlled.  No change in therapy.  It is now time for followup two-dimensional echo to reassess her LV function.  I am hopeful that will remain above 40%.  If not we will have to reconsider whether more complete therapy as needed.

## 2010-10-03 NOTE — Patient Instructions (Signed)
Follow up as scheduled. Your physician recommends that you continue on your current medications as directed. Please refer to the Current Medication list given to you today. Your physician recommends that you go to the [facility] for lab work: BMET Your physician has requested that you have an echocardiogram. Echocardiography is a painless test that uses sound waves to create images of your heart. It provides your doctor with information about the size and shape of your heart and how well your heart's chambers and valves are working. This procedure takes approximately one hour. There are no restrictions for this procedure. If the results of your test are normal or stable, you will receive a letter. If they are abnormal, the nurse will contact you by phone.

## 2010-10-03 NOTE — Progress Notes (Signed)
HPI The patient is seen for cardiology followup.  I saw her last June 05, 2010.  Fortunately she is doing well.  Prior to that she had had some recurrent hospitalizations.  Prior echo had shown improvement in LV function but I was unable to see that study as it was done elsewhere.  I saw her in April blood count was followed up to be sure her hemoglobin was stable.  Hemoglobin was 11.8.  Followup chemistries revealed a BUN of 44, creatinine 1.37, and a potassium of 3.7.  Her ramapril dose was increased.  I have not been able increase her carvedilol because of bradycardia.  She continues on a small amount of clonidine.  My preference would be to stop this but it appears to help control her overall status. Allergies  Allergen Reactions  . Cephalexin     REACTION: Rash to arms/legs    Current Outpatient Prescriptions  Medication Sig Dispense Refill  . Ascorbic Acid (VITAMIN C) 500 MG tablet Take 500 mg by mouth daily.        [name] aspirin 81 MG tablet Take 81 mg by mouth daily.        . Calcium Carbonate-Vitamin D (CALCIUM 600/VITAMIN D) 600-400 MG-UNIT per tablet Take 1 tablet by mouth 2 (two) times daily.        . carvedilol (COREG) 3.125 MG tablet Take 3.125 mg by mouth 2 (two) times daily.        . cloNIDine (CATAPRES) 0.2 MG tablet Take 0.2 mg by mouth 3 (three) times daily.        . ferrous sulfate 325 (65 FE) MG tablet Take 325 mg by mouth daily.        . furosemide (LASIX) 80 MG tablet Take 1 1/2 tablet every morning and 1 tablet every evening.       . Glucosamine-Chondroitin 750-600 MG TABS Take 1 tablet by mouth daily. MOVE FREE      . hydrALAZINE (APRESOLINE) 50 MG tablet TAKE ONE TABLET BY MOUTH THREE TIMES A DAY  90 tablet  6  . isosorbide mononitrate (IMDUR) 60 MG 24 hr tablet Take 60 mg by mouth daily.        [name] KLOR-CON M20 20 MEQ tablet TAKE 2 TABLETS BY MOUTH TWICE A DAY  120 tablet  6  . Multiple Vitamin (MULTIVITAMIN) tablet Take 1 tablet by mouth daily.        . Multiple  Vitamins-Minerals (OCUVITE ADULT 50+) CAPS Take 1 capsule by mouth daily.        . ramipril (ALTACE) 10 MG capsule Take 1 capsule (10 mg total) by mouth daily.  30 capsule  6  . vitamin E 400 UNIT capsule Take 400 Units by mouth daily.          History   Social History  . Marital Status: Married    Spouse Name: N/A    Number of Children: 0  . Years of Education: N/A   Occupational History  . Not on file.   Social History Main Topics  . Smoking status: Former Smoker -- 1.0 packs/day for 24 years    Types: Cigarettes    Quit date: 12/02/1985  . Smokeless tobacco: Never Used  . Alcohol Use: No  . Drug Use: Not on file  . Sexually Active: Not on file   Other Topics Concern  . Not on file   Social History Narrative  . No narrative on file    No family history on file.  Past Medical History  Diagnosis Date  . Depressed left ventricular ejection fraction     EF 25%...nondiagnostic MRI December 2009...  /  echo June, 2010  /   echo... December 08, 2009.... ejection fraction improved... EF 60% /  EF 30%... echo.... [place]... December 28, 2009 with CHFPLanned ICD / CRT... patient has seen Dr.Klein..EF improved October, 2011   . Cardiomyopathy, nonischemic     EF improved October, 2011  . Asymmetric septal hypertrophy     echo....04/2009....patient encouraged the family to be screened elsewhere  . LVH (left ventricular hypertrophy)     moderately severe... echo.. June, 2010  /  EF improved October, 2011.... cancel plans for ICD  . Systolic heart failure     chronic  . Hypertension   . Mitral regurgitation     mild/ moderate...echo June, 2010  /   echo.. October, 2011.... mitral regurgitation  improved  . Aortic insufficiency     mild...echo... June 2 010  . Renal artery stenosis     80% upper left   . Groin hematoma     Right-hematoma/abscess..repaired.. December, 2009  . Anemia     further workup needed... October, 2011.  [name] LBBB (left bundle branch block)   . OSA  (obstructive sleep apnea)   . Arthritis     Hips/Knees, severe, requiring a walker  . Bradycardia   . Acute renal failure      hospitalized... December 08, 2009... improved with hydration in the hospital  . Atrial fibrillation      ??? atrial fibrillation during hospitalization October, 2011 ???..    Past Surgical History  Procedure Date  . Abdominal hysterectomy   . Surgical repair of a catheteriztion associated femoral artery injury     ROS  Patient denies fever, chills, headache, sweats, rash, change in vision, change in hearing, chest pain, cough, nausea vomiting, urinary symptoms.  All other systems are reviewed and are negative.  PHYSICAL EXAM Patient looks good today.  Head is atraumatic.  She still has some drooping of her eyelids.  This becomes more marked when she is volume overloaded.  There is no jugular venous distention.  Lungs are clear respiratory effort is nonlabored.  Cardiac exam reveals S1 and S2.  There are no clicks or significant murmurs.  The abdomen is soft.  There is no peripheral edema.  There are no musculoskeletal deformities.  There are no skin rashes. Filed Vitals:   10/03/10 1308  BP: 149/74  Pulse: 50  Height: 5\' 3"  (1.6 m)  Weight: 170 lb (77.111 kg)    EKG Is not done today.  ASSESSMENT & PLAN

## 2010-10-10 ENCOUNTER — Other Ambulatory Visit (INDEPENDENT_AMBULATORY_CARE_PROVIDER_SITE_OTHER): Payer: Medicare Other | Admitting: *Deleted

## 2010-10-10 DIAGNOSIS — I428 Other cardiomyopathies: Secondary | ICD-10-CM

## 2010-10-10 DIAGNOSIS — I495 Sick sinus syndrome: Secondary | ICD-10-CM

## 2010-10-16 ENCOUNTER — Telehealth: Payer: Self-pay | Admitting: *Deleted

## 2010-10-16 NOTE — Telephone Encounter (Signed)
Message copied by Merlene Laughter on Tue Oct 16, 2010  8:08 AM ------      Message from: Welby, Bath D      Created: Fri Oct 12, 2010 10:53 AM       Heart muscle function is good enough.  Stable.

## 2010-10-16 NOTE — Telephone Encounter (Signed)
Patient informed. 

## 2010-11-11 ENCOUNTER — Other Ambulatory Visit: Payer: Self-pay | Admitting: Cardiology

## 2010-12-07 LAB — CBC
HCT: 24.8 % — ABNORMAL LOW (ref 36.0–46.0)
HCT: 25 % — ABNORMAL LOW (ref 36.0–46.0)
HCT: 26.4 % — ABNORMAL LOW (ref 36.0–46.0)
HCT: 26.8 % — ABNORMAL LOW (ref 36.0–46.0)
HCT: 27.1 % — ABNORMAL LOW (ref 36.0–46.0)
HCT: 27.2 % — ABNORMAL LOW (ref 36.0–46.0)
HCT: 27.3 % — ABNORMAL LOW (ref 36.0–46.0)
HCT: 27.3 % — ABNORMAL LOW (ref 36.0–46.0)
HCT: 27.3 % — ABNORMAL LOW (ref 36.0–46.0)
HCT: 28.1 % — ABNORMAL LOW (ref 36.0–46.0)
HCT: 29.9 % — ABNORMAL LOW (ref 36.0–46.0)
HCT: 30.8 % — ABNORMAL LOW (ref 36.0–46.0)
HCT: 33.3 % — ABNORMAL LOW (ref 36.0–46.0)
Hemoglobin: 10.2 g/dL — ABNORMAL LOW (ref 12.0–15.0)
Hemoglobin: 11.1 g/dL — ABNORMAL LOW (ref 12.0–15.0)
Hemoglobin: 8.4 g/dL — ABNORMAL LOW (ref 12.0–15.0)
Hemoglobin: 8.5 g/dL — ABNORMAL LOW (ref 12.0–15.0)
Hemoglobin: 8.9 g/dL — ABNORMAL LOW (ref 12.0–15.0)
Hemoglobin: 9 g/dL — ABNORMAL LOW (ref 12.0–15.0)
Hemoglobin: 9.1 g/dL — ABNORMAL LOW (ref 12.0–15.0)
Hemoglobin: 9.1 g/dL — ABNORMAL LOW (ref 12.0–15.0)
Hemoglobin: 9.1 g/dL — ABNORMAL LOW (ref 12.0–15.0)
Hemoglobin: 9.3 g/dL — ABNORMAL LOW (ref 12.0–15.0)
Hemoglobin: 9.4 g/dL — ABNORMAL LOW (ref 12.0–15.0)
Hemoglobin: 9.5 g/dL — ABNORMAL LOW (ref 12.0–15.0)
Hemoglobin: 9.9 g/dL — ABNORMAL LOW (ref 12.0–15.0)
MCHC: 33 g/dL (ref 30.0–36.0)
MCHC: 33.1 g/dL (ref 30.0–36.0)
MCHC: 33.2 g/dL (ref 30.0–36.0)
MCHC: 33.2 g/dL (ref 30.0–36.0)
MCHC: 33.3 g/dL (ref 30.0–36.0)
MCHC: 33.4 g/dL (ref 30.0–36.0)
MCHC: 33.7 g/dL (ref 30.0–36.0)
MCHC: 33.9 g/dL (ref 30.0–36.0)
MCHC: 34 g/dL (ref 30.0–36.0)
MCHC: 34 g/dL (ref 30.0–36.0)
MCHC: 34.1 g/dL (ref 30.0–36.0)
MCHC: 34.2 g/dL (ref 30.0–36.0)
MCHC: 34.5 g/dL (ref 30.0–36.0)
MCV: 88.5 fL (ref 78.0–100.0)
MCV: 88.5 fL (ref 78.0–100.0)
MCV: 89 fL (ref 78.0–100.0)
MCV: 89.1 fL (ref 78.0–100.0)
MCV: 89.6 fL (ref 78.0–100.0)
MCV: 89.9 fL (ref 78.0–100.0)
MCV: 89.9 fL (ref 78.0–100.0)
MCV: 90.2 fL (ref 78.0–100.0)
MCV: 90.3 fL (ref 78.0–100.0)
MCV: 90.7 fL (ref 78.0–100.0)
MCV: 90.9 fL (ref 78.0–100.0)
MCV: 91.2 fL (ref 78.0–100.0)
MCV: 92.2 fL (ref 78.0–100.0)
Platelets: 151 10*3/uL (ref 150–400)
Platelets: 161 10*3/uL (ref 150–400)
Platelets: 162 10*3/uL (ref 150–400)
Platelets: 162 10*3/uL (ref 150–400)
Platelets: 166 10*3/uL (ref 150–400)
Platelets: 181 10*3/uL (ref 150–400)
Platelets: 185 10*3/uL (ref 150–400)
Platelets: 187 10*3/uL (ref 150–400)
Platelets: 205 10*3/uL (ref 150–400)
Platelets: 213 10*3/uL (ref 150–400)
Platelets: 217 10*3/uL (ref 150–400)
Platelets: 229 10*3/uL (ref 150–400)
Platelets: 240 10*3/uL (ref 150–400)
RBC: 2.73 MIL/uL — ABNORMAL LOW (ref 3.87–5.11)
RBC: 2.81 MIL/uL — ABNORMAL LOW (ref 3.87–5.11)
RBC: 2.96 MIL/uL — ABNORMAL LOW (ref 3.87–5.11)
RBC: 2.97 MIL/uL — ABNORMAL LOW (ref 3.87–5.11)
RBC: 2.98 MIL/uL — ABNORMAL LOW (ref 3.87–5.11)
RBC: 2.98 MIL/uL — ABNORMAL LOW (ref 3.87–5.11)
RBC: 3.05 MIL/uL — ABNORMAL LOW (ref 3.87–5.11)
RBC: 3.07 MIL/uL — ABNORMAL LOW (ref 3.87–5.11)
RBC: 3.08 MIL/uL — ABNORMAL LOW (ref 3.87–5.11)
RBC: 3.09 MIL/uL — ABNORMAL LOW (ref 3.87–5.11)
RBC: 3.31 MIL/uL — ABNORMAL LOW (ref 3.87–5.11)
RBC: 3.43 MIL/uL — ABNORMAL LOW (ref 3.87–5.11)
RBC: 3.69 MIL/uL — ABNORMAL LOW (ref 3.87–5.11)
RDW: 12.7 % (ref 11.5–15.5)
RDW: 12.8 % (ref 11.5–15.5)
RDW: 13.2 % (ref 11.5–15.5)
RDW: 13.3 % (ref 11.5–15.5)
RDW: 13.3 % (ref 11.5–15.5)
RDW: 14.1 % (ref 11.5–15.5)
RDW: 14.1 % (ref 11.5–15.5)
RDW: 14.2 % (ref 11.5–15.5)
RDW: 14.2 % (ref 11.5–15.5)
RDW: 14.3 % (ref 11.5–15.5)
RDW: 14.4 % (ref 11.5–15.5)
RDW: 14.6 % (ref 11.5–15.5)
RDW: 14.8 % (ref 11.5–15.5)
WBC: 10.4 10*3/uL (ref 4.0–10.5)
WBC: 4 10*3/uL (ref 4.0–10.5)
WBC: 5.2 10*3/uL (ref 4.0–10.5)
WBC: 5.8 10*3/uL (ref 4.0–10.5)
WBC: 6.2 10*3/uL (ref 4.0–10.5)
WBC: 6.4 10*3/uL (ref 4.0–10.5)
WBC: 7.4 10*3/uL (ref 4.0–10.5)
WBC: 7.4 10*3/uL (ref 4.0–10.5)
WBC: 7.9 10*3/uL (ref 4.0–10.5)
WBC: 8.2 10*3/uL (ref 4.0–10.5)
WBC: 8.6 10*3/uL (ref 4.0–10.5)
WBC: 8.8 10*3/uL (ref 4.0–10.5)
WBC: 9.2 10*3/uL (ref 4.0–10.5)

## 2010-12-07 LAB — BASIC METABOLIC PANEL
BUN: 13 mg/dL (ref 6–23)
BUN: 17 mg/dL (ref 6–23)
BUN: 21 mg/dL (ref 6–23)
BUN: 27 mg/dL — ABNORMAL HIGH (ref 6–23)
BUN: 29 mg/dL — ABNORMAL HIGH (ref 6–23)
BUN: 30 mg/dL — ABNORMAL HIGH (ref 6–23)
BUN: 31 mg/dL — ABNORMAL HIGH (ref 6–23)
BUN: 31 mg/dL — ABNORMAL HIGH (ref 6–23)
BUN: 36 mg/dL — ABNORMAL HIGH (ref 6–23)
BUN: 37 mg/dL — ABNORMAL HIGH (ref 6–23)
CO2: 26 mEq/L (ref 19–32)
CO2: 26 mEq/L (ref 19–32)
CO2: 26 mEq/L (ref 19–32)
CO2: 26 mEq/L (ref 19–32)
CO2: 27 mEq/L (ref 19–32)
CO2: 27 mEq/L (ref 19–32)
CO2: 28 mEq/L (ref 19–32)
CO2: 28 mEq/L (ref 19–32)
CO2: 29 mEq/L (ref 19–32)
CO2: 30 mEq/L (ref 19–32)
Calcium: 7.9 mg/dL — ABNORMAL LOW (ref 8.4–10.5)
Calcium: 8 mg/dL — ABNORMAL LOW (ref 8.4–10.5)
Calcium: 8.1 mg/dL — ABNORMAL LOW (ref 8.4–10.5)
Calcium: 8.1 mg/dL — ABNORMAL LOW (ref 8.4–10.5)
Calcium: 8.3 mg/dL — ABNORMAL LOW (ref 8.4–10.5)
Calcium: 8.3 mg/dL — ABNORMAL LOW (ref 8.4–10.5)
Calcium: 8.4 mg/dL (ref 8.4–10.5)
Calcium: 8.5 mg/dL (ref 8.4–10.5)
Calcium: 8.6 mg/dL (ref 8.4–10.5)
Calcium: 8.6 mg/dL (ref 8.4–10.5)
Chloride: 101 mEq/L (ref 96–112)
Chloride: 102 mEq/L (ref 96–112)
Chloride: 103 mEq/L (ref 96–112)
Chloride: 103 mEq/L (ref 96–112)
Chloride: 104 mEq/L (ref 96–112)
Chloride: 104 mEq/L (ref 96–112)
Chloride: 105 mEq/L (ref 96–112)
Chloride: 105 mEq/L (ref 96–112)
Chloride: 106 mEq/L (ref 96–112)
Chloride: 106 mEq/L (ref 96–112)
Creatinine, Ser: 0.74 mg/dL (ref 0.4–1.2)
Creatinine, Ser: 0.87 mg/dL (ref 0.4–1.2)
Creatinine, Ser: 0.92 mg/dL (ref 0.4–1.2)
Creatinine, Ser: 1.06 mg/dL (ref 0.4–1.2)
Creatinine, Ser: 1.09 mg/dL (ref 0.4–1.2)
Creatinine, Ser: 1.11 mg/dL (ref 0.4–1.2)
Creatinine, Ser: 1.14 mg/dL (ref 0.4–1.2)
Creatinine, Ser: 1.47 mg/dL — ABNORMAL HIGH (ref 0.4–1.2)
Creatinine, Ser: 1.57 mg/dL — ABNORMAL HIGH (ref 0.4–1.2)
Creatinine, Ser: 1.6 mg/dL — ABNORMAL HIGH (ref 0.4–1.2)
GFR calc Af Amer: 39 mL/min — ABNORMAL LOW (ref >60)
GFR calc Af Amer: 40 mL/min — ABNORMAL LOW (ref >60)
GFR calc Af Amer: 43 mL/min — ABNORMAL LOW (ref >60)
GFR calc Af Amer: 58 mL/min — ABNORMAL LOW (ref >60)
GFR calc Af Amer: 59 mL/min — ABNORMAL LOW (ref >60)
GFR calc Af Amer: >60 mL/min (ref >60)
GFR calc Af Amer: >60 mL/min (ref >60)
GFR calc Af Amer: >60 mL/min (ref >60)
GFR calc Af Amer: >60 mL/min (ref >60)
GFR calc Af Amer: >60 mL/min (ref >60)
GFR calc non Af Amer: 32 mL/min — ABNORMAL LOW (ref >60)
GFR calc non Af Amer: 33 mL/min — ABNORMAL LOW (ref >60)
GFR calc non Af Amer: 35 mL/min — ABNORMAL LOW (ref >60)
GFR calc non Af Amer: 48 mL/min — ABNORMAL LOW (ref >60)
GFR calc non Af Amer: 49 mL/min — ABNORMAL LOW (ref >60)
GFR calc non Af Amer: 50 mL/min — ABNORMAL LOW (ref >60)
GFR calc non Af Amer: 52 mL/min — ABNORMAL LOW (ref >60)
GFR calc non Af Amer: >60 mL/min (ref >60)
GFR calc non Af Amer: >60 mL/min (ref >60)
GFR calc non Af Amer: >60 mL/min (ref >60)
Glucose, Bld: 100 mg/dL — ABNORMAL HIGH (ref 70–99)
Glucose, Bld: 101 mg/dL — ABNORMAL HIGH (ref 70–99)
Glucose, Bld: 103 mg/dL — ABNORMAL HIGH (ref 70–99)
Glucose, Bld: 105 mg/dL — ABNORMAL HIGH (ref 70–99)
Glucose, Bld: 110 mg/dL — ABNORMAL HIGH (ref 70–99)
Glucose, Bld: 111 mg/dL — ABNORMAL HIGH (ref 70–99)
Glucose, Bld: 117 mg/dL — ABNORMAL HIGH (ref 70–99)
Glucose, Bld: 134 mg/dL — ABNORMAL HIGH (ref 70–99)
Glucose, Bld: 160 mg/dL — ABNORMAL HIGH (ref 70–99)
Glucose, Bld: 89 mg/dL (ref 70–99)
Potassium: 3.1 mEq/L — ABNORMAL LOW (ref 3.5–5.1)
Potassium: 3.3 mEq/L — ABNORMAL LOW (ref 3.5–5.1)
Potassium: 3.4 mEq/L — ABNORMAL LOW (ref 3.5–5.1)
Potassium: 3.6 mEq/L (ref 3.5–5.1)
Potassium: 3.7 mEq/L (ref 3.5–5.1)
Potassium: 4.1 mEq/L (ref 3.5–5.1)
Potassium: 5 mEq/L (ref 3.5–5.1)
Potassium: 5.2 mEq/L — ABNORMAL HIGH (ref 3.5–5.1)
Potassium: 5.2 mEq/L — ABNORMAL HIGH (ref 3.5–5.1)
Potassium: 5.4 mEq/L — ABNORMAL HIGH (ref 3.5–5.1)
Sodium: 136 mEq/L (ref 135–145)
Sodium: 138 mEq/L (ref 135–145)
Sodium: 138 mEq/L (ref 135–145)
Sodium: 138 mEq/L (ref 135–145)
Sodium: 138 mEq/L (ref 135–145)
Sodium: 139 mEq/L (ref 135–145)
Sodium: 140 mEq/L (ref 135–145)
Sodium: 140 mEq/L (ref 135–145)
Sodium: 141 mEq/L (ref 135–145)
Sodium: 143 mEq/L (ref 135–145)

## 2010-12-07 LAB — LIPID PANEL
Cholesterol: 110 mg/dL (ref 0–200)
HDL: 42 mg/dL (ref >39)
LDL Cholesterol: 52 mg/dL (ref 0–99)
Total CHOL/HDL Ratio: 2.6 RATIO
Triglycerides: 80 mg/dL (ref <150)
VLDL: 16 mg/dL (ref 0–40)

## 2010-12-07 LAB — POCT I-STAT 3, ART BLOOD GAS (G3+)
Acid-base deficit: 1 mmol/L (ref 0.0–2.0)
Bicarbonate: 25 mEq/L — ABNORMAL HIGH (ref 20.0–24.0)
O2 Saturation: 92 %
TCO2: 26 mmol/L (ref 0–100)
pCO2 arterial: 43.6 mmHg (ref 35.0–45.0)
pH, Arterial: 7.366 (ref 7.350–7.400)
pO2, Arterial: 68 mmHg — ABNORMAL LOW (ref 80.0–100.0)

## 2010-12-07 LAB — UIFE/LIGHT CHAINS/TP QN, 24-HR UR
Albumin, U: DETECTED
Alpha 1, Urine: DETECTED — AB
Alpha 2, Urine: DETECTED — AB
Beta, Urine: DETECTED — AB
Free Kappa Lt Chains,Ur: 1.63 mg/dL — ABNORMAL HIGH (ref 0.04–1.51)
Free Kappa/Lambda Ratio: 20.38 ratio — ABNORMAL HIGH (ref 0.46–4.00)
Free Lambda Excretion/Day: 0.92 mg/d
Free Lambda Lt Chains,Ur: 0.08 mg/dL (ref 0.08–1.01)
Free Lt Chn Excr Rate: 18.75 mg/d
Gamma Globulin, Urine: DETECTED — AB
Time: 24 hours
Total Protein, Urine-Ur/day: 130 mg/d (ref 10–140)
Total Protein, Urine: 11.3 mg/dL
Volume, Urine: 1150 mL

## 2010-12-07 LAB — PROTEIN ELECTROPHORESIS, SERUM
Albumin ELP: 62 % (ref 55.8–66.1)
Alpha-1-Globulin: 4.3 % (ref 2.9–4.9)
Alpha-2-Globulin: 13.9 % — ABNORMAL HIGH (ref 7.1–11.8)
Beta 2: 3.8 % (ref 3.2–6.5)
Beta Globulin: 5.5 % (ref 4.7–7.2)
Gamma Globulin: 10.5 % — ABNORMAL LOW (ref 11.1–18.8)
M-Spike, %: NOT DETECTED g/dL
Total Protein ELP: 5.5 g/dL — ABNORMAL LOW (ref 6.0–8.3)

## 2010-12-07 LAB — IRON AND TIBC
Iron: 18 ug/dL — ABNORMAL LOW (ref 42–135)
Iron: 53 ug/dL (ref 42–135)
Saturation Ratios: 10 % — ABNORMAL LOW (ref 20–55)
Saturation Ratios: 23 % (ref 20–55)
TIBC: 185 ug/dL — ABNORMAL LOW (ref 250–470)
TIBC: 233 ug/dL — ABNORMAL LOW (ref 250–470)
UIBC: 167 ug/dL
UIBC: 180 ug/dL

## 2010-12-07 LAB — TYPE AND SCREEN
ABO/RH(D): O POS
Antibody Screen: NEGATIVE

## 2010-12-07 LAB — POCT I-STAT 3, VENOUS BLOOD GAS (G3P V)
Acid-Base Excess: 1 mmol/L (ref 0.0–2.0)
Acid-Base Excess: 3 mmol/L — ABNORMAL HIGH (ref 0.0–2.0)
Bicarbonate: 26.6 mEq/L — ABNORMAL HIGH (ref 20.0–24.0)
Bicarbonate: 28.4 mEq/L — ABNORMAL HIGH (ref 20.0–24.0)
O2 Saturation: 63 %
O2 Saturation: 67 %
TCO2: 28 mmol/L (ref 0–100)
TCO2: 30 mmol/L (ref 0–100)
pCO2, Ven: 43.7 mmHg — ABNORMAL LOW (ref 45.0–50.0)
pCO2, Ven: 45.3 mmHg (ref 45.0–50.0)
pH, Ven: 7.377 — ABNORMAL HIGH (ref 7.250–7.300)
pH, Ven: 7.421 — ABNORMAL HIGH (ref 7.250–7.300)
pO2, Ven: 34 mmHg (ref 30.0–45.0)
pO2, Ven: 35 mmHg (ref 30.0–45.0)

## 2010-12-07 LAB — PREPARE RBC (CROSSMATCH)

## 2010-12-07 LAB — B-NATRIURETIC PEPTIDE (CONVERTED LAB): Pro B Natriuretic peptide (BNP): 1087 pg/mL — ABNORMAL HIGH (ref 0.0–100.0)

## 2010-12-07 LAB — MAGNESIUM: Magnesium: 1.9 mg/dL (ref 1.5–2.5)

## 2010-12-07 LAB — ABO/RH: ABO/RH(D): O POS

## 2010-12-07 LAB — FERRITIN: Ferritin: 45 ng/mL (ref 10–291)

## 2010-12-07 LAB — IMMUNOFIXATION ELECTROPHORESIS
IgA: 96 mg/dL (ref 68–378)
IgG (Immunoglobin G), Serum: 434 mg/dL — ABNORMAL LOW (ref 694–1618)
IgM, Serum: 18 mg/dL — ABNORMAL LOW (ref 60–263)
Total Protein ELP: 4.4 g/dL — ABNORMAL LOW (ref 6.0–8.3)

## 2010-12-07 LAB — TSH: TSH: 1.714 u[IU]/mL (ref 0.350–4.500)

## 2010-12-22 ENCOUNTER — Other Ambulatory Visit: Payer: Self-pay | Admitting: Cardiology

## 2011-01-01 ENCOUNTER — Ambulatory Visit: Payer: Medicare Other | Admitting: Cardiology

## 2011-01-05 ENCOUNTER — Other Ambulatory Visit: Payer: Self-pay | Admitting: Cardiology

## 2011-02-14 ENCOUNTER — Ambulatory Visit: Payer: Medicare Other | Admitting: Cardiology

## 2011-02-17 ENCOUNTER — Other Ambulatory Visit: Payer: Self-pay | Admitting: Cardiology

## 2011-02-27 ENCOUNTER — Encounter: Payer: Self-pay | Admitting: Cardiology

## 2011-02-27 DIAGNOSIS — R9430 Abnormal result of cardiovascular function study, unspecified: Secondary | ICD-10-CM | POA: Insufficient documentation

## 2011-02-28 ENCOUNTER — Ambulatory Visit (INDEPENDENT_AMBULATORY_CARE_PROVIDER_SITE_OTHER): Payer: Medicare Other | Admitting: Cardiology

## 2011-02-28 ENCOUNTER — Encounter: Payer: Self-pay | Admitting: Cardiology

## 2011-02-28 VITALS — BP 169/80 | HR 51 | Ht 63.0 in | Wt 178.0 lb

## 2011-02-28 DIAGNOSIS — I10 Essential (primary) hypertension: Secondary | ICD-10-CM

## 2011-02-28 DIAGNOSIS — I447 Left bundle-branch block, unspecified: Secondary | ICD-10-CM

## 2011-02-28 DIAGNOSIS — I428 Other cardiomyopathies: Secondary | ICD-10-CM

## 2011-02-28 DIAGNOSIS — I5022 Chronic systolic (congestive) heart failure: Secondary | ICD-10-CM

## 2011-02-28 DIAGNOSIS — I4891 Unspecified atrial fibrillation: Secondary | ICD-10-CM

## 2011-02-28 DIAGNOSIS — Z01810 Encounter for preprocedural cardiovascular examination: Secondary | ICD-10-CM

## 2011-02-28 MED ORDER — AMLODIPINE BESYLATE 5 MG PO TABS: 5.0000 mg | ORAL_TABLET | Freq: Every day | ORAL | Status: DC

## 2011-02-28 NOTE — Assessment & Plan Note (Signed)
The most recent echo in August, 2012 revealed an ejection fraction as high as 40-45%. Her volume status is stable. No change in therapy.

## 2011-02-28 NOTE — Patient Instructions (Signed)
Follow up as scheduled. Start Amlodipine 5 mg daily.

## 2011-02-28 NOTE — Assessment & Plan Note (Signed)
Left bundle branch block is old. No change in therapy.

## 2011-02-28 NOTE — Progress Notes (Signed)
HPI   Patient is seen today to followup congestive heart failure with cardiomyopathy. I saw her last August, 2012. At that time we checked her renal function it was stable. Followup two-dimensional echo was done to reassess her left ventricular function. Fortunately her ejection fraction was in the 40-45% range. This is good for her. She has a known nonischemic cardiomyopathy. Cardiac catheterization had been done in 2009. She had scattered 20% and 30% lesions. There was no significant obstructive disease. Her left trigger function was worse than. Over time with appropriate medications her LV function has improved. She did have some congestive heart failure last year but this is improved then she's been stable.   Many years ago the patient began to try to lose weight hoping that eventually she would be able to have knee surgery. Today she asks if I would allow her to have this from the cardiac viewpoint. She's not having any chest pain or shortness of breath.  Allergies  Allergen Reactions  . Cephalexin     REACTION: Rash to arms/legs    Current Outpatient Prescriptions  Medication Sig Dispense Refill  . Ascorbic Acid (VITAMIN C) 500 MG tablet Take 500 mg by mouth daily.        [name] aspirin 81 MG tablet Take 81 mg by mouth daily.        . Calcium Carbonate-Vitamin D (CALCIUM 600/VITAMIN D) 600-400 MG-UNIT per tablet Take 1 tablet by mouth 2 (two) times daily.        . carvedilol (COREG) 3.125 MG tablet TAKE 1 TABLET TWICE A DAY  60 tablet  6  . cloNIDine (CATAPRES) 0.2 MG tablet Take 0.2 mg by mouth 3 (three) times daily.        . ferrous sulfate 325 (65 FE) MG tablet Take 325 mg by mouth daily.        . furosemide (LASIX) 80 MG tablet TAKE 1 AND 1/2 TABLET BY MOUTH EVERY MORNING AND 1 TABLET EVERY EVENING  75 tablet  5  . Glucosamine-Chondroitin 750-600 MG TABS Take 1 tablet by mouth daily. MOVE FREE      . hydrALAZINE (APRESOLINE) 50 MG tablet TAKE ONE TABLET BY MOUTH THREE TIMES A DAY  90 tablet   6  . isosorbide mononitrate (IMDUR) 60 MG 24 hr tablet TAKE ONE TABLET BY MOUTH DAILY  30 tablet  6  . KLOR-CON M20 20 MEQ tablet TAKE 2 TABLETS BY MOUTH TWICE A DAY  120 tablet  6  . Multiple Vitamins-Minerals (OCUVITE ADULT 50+) CAPS Take 1 capsule by mouth daily.        . ramipril (ALTACE) 10 MG capsule TAKE ONE CAPSULE BY MOUTH EVERY DAY  30 capsule  6  . vitamin E 400 UNIT capsule Take 400 Units by mouth daily.          History   Social History  . Marital Status: Married    Spouse Name: N/A    Number of Children: 0  . Years of Education: N/A   Occupational History  . Not on file.   Social History Main Topics  . Smoking status: Former Smoker -- 1.0 packs/day for 24 years    Types: Cigarettes    Quit date: 12/02/1985  . Smokeless tobacco: Never Used  . Alcohol Use: No  . Drug Use: Not on file  . Sexually Active: Not on file   Other Topics Concern  . Not on file   Social History Narrative  . No narrative on file  No family history on file.  Past Medical History  Diagnosis Date  . Ejection fraction     EF 25%...nondiagnostic MRI December 2009...  /  echo June, 2010  /   echo... December 08, 2009.... ejection fraction improved... EF 60% /  EF 30%... echo.... [place]... December 28, 2009 with CHFPLanned ICD / CRT... patient has seen Dr.Klein..EF improved October, 2011   . Cardiomyopathy, nonischemic     EF improved October, 2011  . Asymmetric septal hypertrophy     echo....04/2009....patient encouraged the family to be screened elsewhere  . LVH (left ventricular hypertrophy)     moderately severe... echo.. June, 2010  /  EF improved October, 2011.... cancel plans for ICD  . Systolic heart failure     chronic  . Hypertension   . Mitral regurgitation     mild/ moderate...echo June, 2010  /   echo.. October, 2011.... mitral regurgitation  improved  . Aortic insufficiency     mild...echo... June 2 010  . Renal artery stenosis     80% upper left   . Groin hematoma      Right-hematoma/abscess..repaired.. December, 2009  . Anemia     further workup needed... October, 2011.  [name] LBBB (left bundle branch block)   . OSA (obstructive sleep apnea)   . Arthritis     Hips/Knees, severe, requiring a walker  . Bradycardia   . Acute renal failure      hospitalized... December 08, 2009... improved with hydration in the hospital  . Atrial fibrillation      ??? atrial fibrillation during hospitalization October, 2011 ???..    Past Surgical History  Procedure Date  . Abdominal hysterectomy   . Surgical repair of a catheteriztion associated femoral artery injury     ROS   Patient denies fever, chills, headache, sweats, rash, change in vision, change in hearing, chest pain, cough, nausea vomiting, urinary symptoms. All other systems are reviewed and are negative.  PHYSICAL EXAM  Patient is oriented to person time and place. Affect is normal. Head is atraumatic. There is no jugulovenous distention. She has gained 8 pounds since her last visit. This appears to be true body weight. Lungs are clear. Respiratory effort is nonlabored. Cardiac exam reveals S1 and S2. There is a soft systolic murmur. The abdomen is soft. There is no significant peripheral edema. She walks with her rolling walker.  There are no skin rashes.  Filed Vitals:   02/28/11 0940  BP: 169/80  Pulse: 51  Height: 5\' 3"  (1.6 m)  Weight: 178 lb (80.74 kg)    EKG  EKG is done today and reviewed by me. She has  Old left bundle branch block. There is sinus bradycardia. There is no significant change.  ASSESSMENT & PLAN

## 2011-02-28 NOTE — Assessment & Plan Note (Signed)
Historically the patient's cardiomyopathy is nonischemic. Her cath was done in 2009. She does not have chest pain. She had only minimal disease at that time. She has had active congestive heart failure but has had no symptoms within the past 6 months. Careful control of her volume keeps her stable. I feel that she can be a candidate for total knee replacement. I made it clear to her that this should be done at a center where careful attention to be paid to her cardiovascular status. I believe that with very careful volume control she can do well.

## 2011-02-28 NOTE — Assessment & Plan Note (Signed)
Her blood pressure is elevated today. She is on multiple medications. I will add amlodipine to her medicines in and see her back.

## 2011-03-27 ENCOUNTER — Encounter: Payer: Self-pay | Admitting: Cardiology

## 2011-03-27 ENCOUNTER — Ambulatory Visit (INDEPENDENT_AMBULATORY_CARE_PROVIDER_SITE_OTHER): Payer: Medicare Other | Admitting: Cardiology

## 2011-03-27 DIAGNOSIS — I447 Left bundle-branch block, unspecified: Secondary | ICD-10-CM

## 2011-03-27 DIAGNOSIS — I340 Nonrheumatic mitral (valve) insufficiency: Secondary | ICD-10-CM

## 2011-03-27 DIAGNOSIS — Z01810 Encounter for preprocedural cardiovascular examination: Secondary | ICD-10-CM

## 2011-03-27 DIAGNOSIS — I059 Rheumatic mitral valve disease, unspecified: Secondary | ICD-10-CM

## 2011-03-27 DIAGNOSIS — I10 Essential (primary) hypertension: Secondary | ICD-10-CM

## 2011-03-27 DIAGNOSIS — I5022 Chronic systolic (congestive) heart failure: Secondary | ICD-10-CM

## 2011-03-27 DIAGNOSIS — Z79899 Other long term (current) drug therapy: Secondary | ICD-10-CM

## 2011-03-27 NOTE — Assessment & Plan Note (Signed)
Patient has a chronic left bundle branch block. No further workup is needed.

## 2011-03-27 NOTE — Assessment & Plan Note (Signed)
The patient will be seeing the orthopedic surgeons in [facility] soon. A copy of this note will be sent to them. Her overall cardiac status is stable. We know that her ejection fraction is 40-45%. We know that catheterization done in 2009 showed only very minimal coronary disease. Her LV dysfunction historically is felt to be non-ischemic. She is stable. I feel that she can be cleared for knee surgery. Careful attention will have to be paid to her overall volume status to be sure that she does not become volume overloaded.

## 2011-03-27 NOTE — Patient Instructions (Addendum)
Your physician wants you to follow-up in: 3 months. CALL THE OFFICE IN 1 MONTH TO SCHEDULE THIS APPOINTMENT. Your physician recommends that you go to the [facility] for lab work: FLP/BMET. If the results of your test are normal or stable, you will receive a letter. If they are abnormal, the nurse will contact you by phone.  Your physician recommends that you continue on your current medications as directed. Please refer to the Current Medication list given to you today.

## 2011-03-27 NOTE — Assessment & Plan Note (Signed)
With the addition of amlodipine her blood pressure is under good control. No further change in therapy at this time.

## 2011-03-27 NOTE — Progress Notes (Signed)
HPI  Patient is seen today for cardiology followup. Overall her cardiac status is stable. She has a history of congestive heart failure with cardiomyopathy. However her most recent echo revealed an ejection fraction in the 40-45% range. When I saw her February 28, 2011 her blood pressure was mildly elevated. Amlodipine 5 mg daily was added and she has had a nice response. In the past she had edema with amlodipine. However with her current medications she is quite stable.  She does have an appointment to be considered for knee surgery in [place]. We will send all of her information to the [facility] orthopedic clinic in [place]. When I saw her last in December, 2012 I had set in my note that I felt that she could be cleared for orthopedic surgery. We know she does not have significant coronary disease. We know that careful attention to her volume status is the key to keeping her stable.  Allergies  Allergen Reactions  . Cephalexin     REACTION: Rash to arms/legs    Current Outpatient Prescriptions  Medication Sig Dispense Refill  . amLODipine (NORVASC) 5 MG tablet Take 1 tablet (5 mg total) by mouth daily.  30 tablet  6  . Ascorbic Acid (VITAMIN C) 500 MG tablet Take 500 mg by mouth daily.        [name] aspirin 81 MG tablet Take 81 mg by mouth daily.        . Calcium Carbonate-Vitamin D (CALCIUM 600/VITAMIN D) 600-400 MG-UNIT per tablet Take 1 tablet by mouth 2 (two) times daily.        . carvedilol (COREG) 3.125 MG tablet TAKE 1 TABLET TWICE A DAY  60 tablet  6  . cloNIDine (CATAPRES) 0.2 MG tablet Take 0.2 mg by mouth 3 (three) times daily.        . ferrous sulfate 325 (65 FE) MG tablet Take 325 mg by mouth daily.        . furosemide (LASIX) 80 MG tablet TAKE 1 AND 1/2 TABLET BY MOUTH EVERY MORNING AND 1 TABLET EVERY EVENING  75 tablet  5  . Glucosamine-Chondroitin 750-600 MG TABS Take 1 tablet by mouth daily. MOVE FREE      . hydrALAZINE (APRESOLINE) 50 MG tablet TAKE ONE TABLET BY MOUTH THREE  TIMES A DAY  90 tablet  6  . isosorbide mononitrate (IMDUR) 60 MG 24 hr tablet TAKE ONE TABLET BY MOUTH DAILY  30 tablet  6  . KLOR-CON M20 20 MEQ tablet TAKE 2 TABLETS BY MOUTH TWICE A DAY  120 tablet  6  . Multiple Vitamins-Minerals (OCUVITE ADULT 50+) CAPS Take 1 capsule by mouth daily.        . ramipril (ALTACE) 10 MG capsule TAKE ONE CAPSULE BY MOUTH EVERY DAY  30 capsule  6  . vitamin E 400 UNIT capsule Take 400 Units by mouth daily.          History   Social History  . Marital Status: Married    Spouse Name: N/A    Number of Children: 0  . Years of Education: N/A   Occupational History  . Not on file.   Social History Main Topics  . Smoking status: Former Smoker -- 1.0 packs/day for 24 years    Types: Cigarettes    Quit date: 12/02/1985  . Smokeless tobacco: Never Used  . Alcohol Use: No  . Drug Use: Not on file  . Sexually Active: Not on file   Other Topics Concern  .  Not on file   Social History Narrative  . No narrative on file    No family history on file.  Past Medical History  Diagnosis Date  . Ejection fraction     EF 25%...nondiagnostic MRI December 2009...  /  echo June, 2010  /   echo... December 08, 2009.... ejection fraction improved... EF 60% /  EF 30%... echo.... [place]... December 28, 2009 with CHFPLanned ICD / CRT... patient has seen Dr.Klein..EF improved October, 2011   . Cardiomyopathy, nonischemic     EF improved October, 2011  . Asymmetric septal hypertrophy     echo....04/2009....patient encouraged the family to be screened elsewhere  . LVH (left ventricular hypertrophy)     moderately severe... echo.. June, 2010  /  EF improved October, 2011.... cancel plans for ICD  . Systolic heart failure     chronic  . Hypertension   . Mitral regurgitation     mild/ moderate...echo June, 2010  /   echo.. October, 2011.... mitral regurgitation  improved  . Aortic insufficiency     mild...echo... June 2 010  . Renal artery stenosis     80% upper  left   . Groin hematoma     Right-hematoma/abscess..repaired.. December, 2009  . Anemia     further workup needed... October, 2011.  [name] LBBB (left bundle branch block)   . OSA (obstructive sleep apnea)   . Arthritis     Hips/Knees, severe, requiring a walker  . Bradycardia   . Acute renal failure      hospitalized... December 08, 2009... improved with hydration in the hospital  . Atrial fibrillation      ??? atrial fibrillation during hospitalization October, 2011 ???..    Past Surgical History  Procedure Date  . Abdominal hysterectomy   . Surgical repair of a catheteriztion associated femoral artery injury     ROS   Patient denies fever, chills, headache, sweats, rash, change in vision, change in hearing, chest pain, cough, nausea vomiting, urinary symptoms. All other systems are reviewed and are negative.  PHYSICAL EXAM  Patient is stable. She has mild edema of her eyelids. This is chronic. There is no jugulovenous distention. Lungs are clear. Respiratory effort is nonlabored. Cardiac exam reveals S1 and S2. There is a soft systolic murmur. The abdomen is soft. She is no peripheral edema. There are no musculoskeletal deformities. There are no skin rashes area  Filed Vitals:   03/27/11 1019  BP: 144/69  Pulse: 48  Height: 5\' 3"  (1.6 m)  Weight: 175 lb (79.379 kg)    ASSESSMENT & PLAN

## 2011-03-27 NOTE — Assessment & Plan Note (Signed)
CHF is stable and her current medications. We know from August, 2012 that her ejection fraction was 40-45%. Her volume status is currently stable. She does not need any further workup. We will check labs to be sure that her renal function and potassium are stable.

## 2011-03-27 NOTE — Assessment & Plan Note (Signed)
She does have mild mitral regurgitation. No further workup is needed.

## 2011-04-26 ENCOUNTER — Telehealth: Payer: Self-pay | Admitting: *Deleted

## 2011-04-26 NOTE — Telephone Encounter (Signed)
Patient informed and verbalized understanding

## 2011-04-26 NOTE — Telephone Encounter (Signed)
Message copied by Merlene Laughter on Fri Apr 26, 2011  8:09 AM ------      Message from: Merlene Laughter      Created: Thu Apr 25, 2011  4:47 PM                   ----- Message -----         From: Carlena Bjornstad, MD         Sent: 04/25/2011  11:37 AM           To: Gurney Maxin, RN            Labs look good except potassium is 3.3 she is on potassium. Please ask her to take 1 extra potassium pill in addition to her other pills for 3 days thanks

## 2011-04-30 ENCOUNTER — Other Ambulatory Visit: Payer: Self-pay

## 2011-04-30 MED ORDER — POTASSIUM CHLORIDE CRYS ER 20 MEQ PO TBCR: 20.0000 meq | EXTENDED_RELEASE_TABLET | Freq: Every day | ORAL | Status: DC

## 2011-04-30 NOTE — Telephone Encounter (Signed)
..   Requested Prescriptions   Signed Prescriptions Disp Refills  . potassium chloride SA (KLOR-CON M20) 20 MEQ tablet 120 tablet 6    Sig: Take 1 tablet (20 mEq total) by mouth daily.    Authorizing Provider: Ron Parker, JEFFREY D    Ordering User: Laurence Compton   E-scribe to [facility] location.

## 2011-05-02 ENCOUNTER — Other Ambulatory Visit: Payer: Self-pay | Admitting: *Deleted

## 2011-05-02 MED ORDER — POTASSIUM CHLORIDE CRYS ER 20 MEQ PO TBCR: 40.0000 meq | EXTENDED_RELEASE_TABLET | Freq: Two times a day (BID) | ORAL | Status: DC

## 2011-06-01 ENCOUNTER — Other Ambulatory Visit: Payer: Self-pay | Admitting: Cardiology

## 2011-06-28 ENCOUNTER — Encounter: Payer: Self-pay | Admitting: Cardiology

## 2011-06-28 ENCOUNTER — Ambulatory Visit (INDEPENDENT_AMBULATORY_CARE_PROVIDER_SITE_OTHER): Payer: Medicare Other | Admitting: Cardiology

## 2011-06-28 VITALS — BP 153/77 | HR 51 | Ht 64.0 in | Wt 182.0 lb

## 2011-06-28 DIAGNOSIS — Z01810 Encounter for preprocedural cardiovascular examination: Secondary | ICD-10-CM

## 2011-06-28 DIAGNOSIS — I447 Left bundle-branch block, unspecified: Secondary | ICD-10-CM

## 2011-06-28 DIAGNOSIS — I5022 Chronic systolic (congestive) heart failure: Secondary | ICD-10-CM

## 2011-06-28 DIAGNOSIS — I4891 Unspecified atrial fibrillation: Secondary | ICD-10-CM

## 2011-06-28 DIAGNOSIS — I10 Essential (primary) hypertension: Secondary | ICD-10-CM

## 2011-06-28 DIAGNOSIS — I498 Other specified cardiac arrhythmias: Secondary | ICD-10-CM

## 2011-06-28 DIAGNOSIS — R001 Bradycardia, unspecified: Secondary | ICD-10-CM

## 2011-06-28 MED ORDER — ISOSORBIDE MONONITRATE ER 60 MG PO TB24: 60.0000 mg | ORAL_TABLET | Freq: Every day | ORAL | Status: DC

## 2011-06-28 MED ORDER — CLONIDINE HCL 0.2 MG PO TABS: 0.2000 mg | ORAL_TABLET | Freq: Three times a day (TID) | ORAL | Status: DC

## 2011-06-28 NOTE — Assessment & Plan Note (Signed)
Over time I have very carefully assessed whether the patient would eventually be a candidate for orthopedic surgery. She has remained stable. Her last ejection fraction was 40-45%. Catheterization in 2009 showed only minimal coronary disease. She has not had any significant arrhythmias. There's been no recent chest pain. The patient is stable and cleared for orthopedic surgery. Careful attention will have to be paid to be sure that her volume status is Stable.  The patient is cleared for orthopedic surgery.

## 2011-06-28 NOTE — Assessment & Plan Note (Signed)
Blood pressure is slightly elevated today. It will be most prudent not to change her medicines at this time.

## 2011-06-28 NOTE — Assessment & Plan Note (Signed)
Patient has old left bundle branch block. No further workup.

## 2011-06-28 NOTE — Progress Notes (Signed)
HPI The patient is seen today to followup cardiomyopathy. She's also seen to help with cardiac clearance for upcoming knee surgery. The patient has left ventricular dysfunction. Fortunately she is now been quite stable. Careful attention has to be paid to her salt and fluid intake.    Allergies  Allergen Reactions  . Cephalexin     REACTION: Rash to arms/legs    Current Outpatient Prescriptions  Medication Sig Dispense Refill  . amLODipine (NORVASC) 5 MG tablet Take 1 tablet (5 mg total) by mouth daily.  30 tablet  6  . Ascorbic Acid (VITAMIN C) 500 MG tablet Take 500 mg by mouth daily.        [name] aspirin 81 MG tablet Take 81 mg by mouth daily.        . Calcium Carbonate-Vitamin D (CALCIUM 600/VITAMIN D) 600-400 MG-UNIT per tablet Take 1 tablet by mouth 2 (two) times daily.        . carvedilol (COREG) 3.125 MG tablet TAKE 1 TABLET TWICE A DAY  60 tablet  6  . cloNIDine (CATAPRES) 0.2 MG tablet Take 0.2 mg by mouth 3 (three) times daily.        . ferrous sulfate 325 (65 FE) MG tablet Take 325 mg by mouth daily.        . furosemide (LASIX) 80 MG tablet TAKE 1 AND 1/2 TABLET BY MOUTH EVERY MORNING AND 1 TABLET EVERY EVENING  75 tablet  5  . Glucosamine-Chondroitin 750-600 MG TABS Take 1 tablet by mouth daily. MOVE FREE      . hydrALAZINE (APRESOLINE) 50 MG tablet TAKE ONE TABLET BY MOUTH THREE TIMES A DAY  90 tablet  6  . isosorbide mononitrate (IMDUR) 60 MG 24 hr tablet TAKE ONE TABLET BY MOUTH DAILY  30 tablet  6  . Multiple Vitamins-Minerals (OCUVITE ADULT 50+) CAPS Take 1 capsule by mouth daily.        . potassium chloride SA (KLOR-CON M20) 20 MEQ tablet Take 2 tablets (40 mEq total) by mouth 2 (two) times daily.  120 tablet  6  . ramipril (ALTACE) 10 MG capsule TAKE ONE CAPSULE BY MOUTH EVERY DAY  30 capsule  6  . vitamin E 400 UNIT capsule Take 400 Units by mouth daily.          History   Social History  . Marital Status: Married    Spouse Name: N/A    Number of Children: 0  .  Years of Education: N/A   Occupational History  . Not on file.   Social History Main Topics  . Smoking status: Former Smoker -- 1.0 packs/day for 24 years    Types: Cigarettes    Quit date: 12/02/1985  . Smokeless tobacco: Never Used  . Alcohol Use: No  . Drug Use: Not on file  . Sexually Active: Not on file   Other Topics Concern  . Not on file   Social History Narrative  . No narrative on file    No family history on file.  Past Medical History  Diagnosis Date  . Ejection fraction     EF 25%...nondiagnostic MRI December 2009...  /  echo June, 2010  /   echo... December 08, 2009.... ejection fraction improved... EF 60% /  EF 30%... echo.... [facility]... December 28, 2009 with CHFPLanned ICD / CRT... patient has seen Dr.Klein..EF improved October, 2011   . Cardiomyopathy, nonischemic     EF improved October, 2011  . Asymmetric septal hypertrophy  echo....04/2009....patient encouraged the family to be screened elsewhere  . LVH (left ventricular hypertrophy)     moderately severe... echo.. June, 2010  /  EF improved October, 2011.... cancel plans for ICD  . Systolic heart failure     chronic  . Hypertension   . Mitral regurgitation     mild/ moderate...echo June, 2010  /   echo.. October, 2011.... mitral regurgitation  improved  . Aortic insufficiency     mild...echo... June 2 010  . Renal artery stenosis     80% upper left   . Groin hematoma     Right-hematoma/abscess..repaired.. December, 2009  . Anemia     further workup needed... October, 2011.  [name] LBBB (left bundle branch block)   . OSA (obstructive sleep apnea)   . Arthritis     Hips/Knees, severe, requiring a walker  . Bradycardia   . Acute renal failure      hospitalized... December 08, 2009... improved with hydration in the hospital  . Atrial fibrillation      ??? atrial fibrillation during hospitalization October, 2011 ???..    Past Surgical History  Procedure Date  . Abdominal hysterectomy   .  Surgical repair of a catheteriztion associated femoral artery injury     ROS    Patient denies fever, chills, headache, sweats, rash, change in vision, change in hearing, chest pain, cough, nausea vomiting, urinary symptoms. All other systems are reviewed and are negative.  PHYSICAL EXAM   Patient is stable today. She is oriented to person time and place. Affect is normal. She has some swelling of her upper eyelids. This is an old finding. There is no jugular venous distention. Lungs are clear. Respiratory effort is nonlabored. Cardiac exam reveals S1 and S2. There no clicks or significant murmurs. The patient came in with her with rolling walker. Her abdomen is soft. There is no peripheral edema. There are no significant skin rashes.  Filed Vitals:   06/28/11 0916  BP: 153/77  Pulse: 51  Height: 5\' 4"  (1.626 m)  Weight: 182 lb (82.555 kg)   EKG is done today and reviewed by me. She has sinus rhythm. She has old left bundle branch block.  ASSESSMENT & PLAN

## 2011-06-28 NOTE — Patient Instructions (Signed)
You have been cleared for surgery by Dr Ron Parker from a cardiac standpoint  Your physician recommends that you schedule a follow-up appointment in: 3 months.

## 2011-06-28 NOTE — Assessment & Plan Note (Signed)
The patient's chronic systolic heart rate uric is under control. Her ejection fraction has varied over time. Her last ejection fraction was 40-45% in August, 2012. She does not need a further study at this time.

## 2011-07-27 ENCOUNTER — Other Ambulatory Visit: Payer: Self-pay | Admitting: Cardiology

## 2011-09-02 ENCOUNTER — Other Ambulatory Visit: Payer: Self-pay | Admitting: Cardiology

## 2011-10-07 ENCOUNTER — Other Ambulatory Visit: Payer: Self-pay | Admitting: Cardiology

## 2011-10-07 NOTE — Telephone Encounter (Signed)
Fax Received. Refill Completed. Atalie Oros Chowoe (R.M.A)   

## 2011-10-09 ENCOUNTER — Ambulatory Visit: Payer: Medicare Other | Admitting: Cardiology

## 2011-12-10 ENCOUNTER — Encounter: Payer: Self-pay | Admitting: Cardiology

## 2011-12-10 ENCOUNTER — Ambulatory Visit (INDEPENDENT_AMBULATORY_CARE_PROVIDER_SITE_OTHER): Payer: Medicare Other | Admitting: Cardiology

## 2011-12-10 VITALS — BP 147/72 | HR 50 | Ht 63.0 in | Wt 190.0 lb

## 2011-12-10 DIAGNOSIS — R001 Bradycardia, unspecified: Secondary | ICD-10-CM

## 2011-12-10 DIAGNOSIS — R0989 Other specified symptoms and signs involving the circulatory and respiratory systems: Secondary | ICD-10-CM

## 2011-12-10 DIAGNOSIS — I2510 Atherosclerotic heart disease of native coronary artery without angina pectoris: Secondary | ICD-10-CM | POA: Insufficient documentation

## 2011-12-10 DIAGNOSIS — R9430 Abnormal result of cardiovascular function study, unspecified: Secondary | ICD-10-CM

## 2011-12-10 DIAGNOSIS — I10 Essential (primary) hypertension: Secondary | ICD-10-CM

## 2011-12-10 DIAGNOSIS — I5020 Unspecified systolic (congestive) heart failure: Secondary | ICD-10-CM

## 2011-12-10 DIAGNOSIS — I428 Other cardiomyopathies: Secondary | ICD-10-CM

## 2011-12-10 DIAGNOSIS — I498 Other specified cardiac arrhythmias: Secondary | ICD-10-CM

## 2011-12-10 NOTE — Patient Instructions (Addendum)
Your physician recommends that you schedule a follow-up appointment in: 5 months. You will receive a reminder letter in the mail in about 2 months reminding you to call and schedule your appointment. If you don't receive this letter, please contact our office.  Your physician recommends that you continue on your current medications as directed. Please refer to the Current Medication list given to you today.

## 2011-12-10 NOTE — Progress Notes (Signed)
HPI  The patient is seen to followup her overall cardiac status. When I saw her last in April, 2013 she was cleared for knee surgery. We were very careful in assessing her at that time. I felt that she could undergo her knee surgery. She has had this done successfully in [place]. She's doing extremely well. The plan will be to have hip surgery on the left side in January, 2014. This will be followed later by a knee replacement on the left side. She's doing extremely well and she is extremely motivated. She's not having any chest pain or shortness of breath.  Allergies  Allergen Reactions  . Cephalexin     REACTION: Rash to arms/legs    Current Outpatient Prescriptions  Medication Sig Dispense Refill  . amLODipine (NORVASC) 5 MG tablet TAKE 1 TABLET DAILY  30 tablet  6  . Ascorbic Acid (VITAMIN C) 500 MG tablet Take 500 mg by mouth daily.        [name] aspirin 81 MG tablet Take 81 mg by mouth daily.        [name] azelastine (ASTELIN) 137 MCG/SPRAY nasal spray Place 1 spray into the nose 2 (two) times daily. Use in each nostril as directed      . Calcium Carbonate-Vitamin D (CALCIUM 600/VITAMIN D) 600-400 MG-UNIT per tablet Take 1 tablet by mouth 2 (two) times daily.        . carvedilol (COREG) 3.125 MG tablet TAKE 1 TABLET TWICE A DAY  60 tablet  6  . cloNIDine (CATAPRES) 0.2 MG tablet Take 1 tablet (0.2 mg total) by mouth 3 (three) times daily.  90 tablet  6  . ferrous sulfate 325 (65 FE) MG tablet Take 325 mg by mouth daily.        . furosemide (LASIX) 80 MG tablet TAKE 1& 1/2 TABLET EVERY MORNING & TAKE 1 TABLET EVERY EVENING  75 tablet  5  . Glucosamine-Chondroitin 750-600 MG TABS Take 1 tablet by mouth daily. MOVE FREE      . hydrALAZINE (APRESOLINE) 50 MG tablet TAKE ONE TABLET BY MOUTH THREE TIMES A DAY  90 tablet  6  . isosorbide mononitrate (IMDUR) 60 MG 24 hr tablet Take 1 tablet (60 mg total) by mouth daily.  30 tablet  6  . Multiple Vitamins-Minerals (OCUVITE ADULT 50+) CAPS Take 1  capsule by mouth daily.        . potassium chloride SA (KLOR-CON M20) 20 MEQ tablet Take 2 tablets (40 mEq total) by mouth 2 (two) times daily.  120 tablet  6  . ramipril (ALTACE) 10 MG capsule TAKE ONE CAPSULE BY MOUTH EVERY DAY  30 capsule  6  . vitamin E 400 UNIT capsule Take 400 Units by mouth daily.          History   Social History  . Marital Status: Married    Spouse Name: N/A    Number of Children: 0  . Years of Education: N/A   Occupational History  . Not on file.   Social History Main Topics  . Smoking status: Former Smoker -- 1.0 packs/day for 24 years    Types: Cigarettes    Quit date: 12/02/1985  . Smokeless tobacco: Never Used  . Alcohol Use: No  . Drug Use: Not on file  . Sexually Active: Not on file   Other Topics Concern  . Not on file   Social History Narrative  . No narrative on file    No family history  on file.  Past Medical History  Diagnosis Date  . Ejection fraction     EF 25%...nondiagnostic MRI December 2009...  /  echo June, 2010  /   echo... December 08, 2009.... ejection fraction improved... EF 60% /  EF 30%... echo.... [place]... December 28, 2009 with CHFPLanned ICD / CRT... patient has seen Dr.Klein..EF improved October, 2011   . Cardiomyopathy, nonischemic     EF improved October, 2011  . Asymmetric septal hypertrophy     echo....04/2009....patient encouraged the family to be screened elsewhere  . LVH (left ventricular hypertrophy)     moderately severe... echo.. June, 2010  /  EF improved October, 2011.... cancel plans for ICD  . Systolic heart failure     chronic  . Hypertension   . Mitral regurgitation     mild/ moderate...echo June, 2010  /   echo.. October, 2011.... mitral regurgitation  improved  . Aortic insufficiency     mild...echo... June 2 010  . Renal artery stenosis     80% upper left   . Groin hematoma     Right-hematoma/abscess..repaired.. December, 2009  . Anemia     further workup needed... October, 2011.  [name]  LBBB (left bundle branch block)   . OSA (obstructive sleep apnea)   . Arthritis     Hips/Knees, severe, requiring a walker  . Bradycardia   . Acute renal failure      hospitalized... December 08, 2009... improved with hydration in the hospital  . Atrial fibrillation      ??? atrial fibrillation during hospitalization October, 2011 ???..    Past Surgical History  Procedure Date  . Abdominal hysterectomy   . Surgical repair of a catheteriztion associated femoral artery injury     Patient Active Problem List  Diagnosis  . HYPERTENSION  . PERICARDIAL EFFUSION  . AORTIC INSUFFICIENCY  . LBBB  . CHRONIC SYSTOLIC HEART FAILURE  . RENAL ARTERY STENOSIS  . HYPOKALEMIA  . Cardiomyopathy, nonischemic  . Asymmetric septal hypertrophy  . LVH (left ventricular hypertrophy)  . Systolic heart failure  . Hypertension  . Mitral regurgitation  . Renal artery stenosis  . Anemia  . LBBB (left bundle branch block)  . OSA (obstructive sleep apnea)  . Bradycardia  . Atrial fibrillation  . Ejection fraction  . Preop cardiovascular exam    ROS   Patient denies fever, chills, headache, sweats, rash, change in vision, change in hearing, chest pain, cough, nausea vomiting, urinary symptoms. All other systems are reviewed and are negative.  PHYSICAL EXAM   Patient's walking with a walker. She looks very good. She is oriented to person time and place. Affect is normal. Lungs are clear. Respiratory effort is nonlabored. Cardiac exam reveals S1 and S2. There no clicks or significant murmurs. Abdomen is soft. There is no peripheral edema. Her right knee is nicely healing. She has no significant edema.  Filed Vitals:   12/10/11 1005  BP: 147/72  Pulse: 50  Height: 5\' 3"  (1.6 m)  Weight: 190 lb (86.183 kg)   EKG is done today and reviewed by me. She has old sinus bradycardia and old left bundle branch block.  ASSESSMENT & PLAN

## 2011-12-10 NOTE — Assessment & Plan Note (Signed)
Historically the patient had very mild coronary disease in 2009. She's not having any symptoms. No change in therapy.

## 2011-12-10 NOTE — Assessment & Plan Note (Signed)
Blood pressure is well controlled. Ultimately I would like to have her off clonidine. However she is done well and now is not a good time to change her medicines. She is also aware that this medicine should never be stopped abruptly. It should be tapered if needed.

## 2011-12-10 NOTE — Assessment & Plan Note (Signed)
The patient has had problems chronically with volume overload. This is controlled. No change in therapy.

## 2011-12-10 NOTE — Assessment & Plan Note (Signed)
She has sinus bradycardia. This is old. There no symptoms. No change in therapy.

## 2011-12-10 NOTE — Assessment & Plan Note (Signed)
In the past the patient has had left ventricular dysfunction. However her most recent echo does show an ejection fraction in the 40-45% range. She has done well. No change in therapy.

## 2011-12-16 ENCOUNTER — Other Ambulatory Visit: Payer: Self-pay | Admitting: Cardiology

## 2011-12-30 ENCOUNTER — Other Ambulatory Visit: Payer: Self-pay | Admitting: Cardiology

## 2012-01-29 ENCOUNTER — Other Ambulatory Visit: Payer: Self-pay

## 2012-01-29 MED ORDER — CARVEDILOL 3.125 MG PO TABS: 3.1250 mg | ORAL_TABLET | Freq: Two times a day (BID) | ORAL | Status: DC

## 2012-02-13 ENCOUNTER — Telehealth: Payer: Self-pay | Admitting: Cardiology

## 2012-02-13 ENCOUNTER — Other Ambulatory Visit: Payer: Self-pay

## 2012-02-13 MED ORDER — ISOSORBIDE MONONITRATE ER 60 MG PO TB24: 60.0000 mg | ORAL_TABLET | Freq: Every day | ORAL | Status: DC

## 2012-02-13 NOTE — Telephone Encounter (Signed)
Gave verbal orders to pharmacy to refill IMDUR 60 MG qty of 30 with 4 refills

## 2012-02-24 ENCOUNTER — Other Ambulatory Visit: Payer: Self-pay | Admitting: *Deleted

## 2012-02-24 MED ORDER — FUROSEMIDE 80 MG PO TABS: ORAL_TABLET | ORAL | Status: DC

## 2012-02-24 MED ORDER — HYDRALAZINE HCL 50 MG PO TABS: 50.0000 mg | ORAL_TABLET | Freq: Three times a day (TID) | ORAL | Status: DC

## 2012-02-27 ENCOUNTER — Other Ambulatory Visit: Payer: Self-pay

## 2012-02-27 ENCOUNTER — Other Ambulatory Visit: Payer: Self-pay | Admitting: *Deleted

## 2012-02-27 MED ORDER — CARVEDILOL 3.125 MG PO TABS: 3.1250 mg | ORAL_TABLET | Freq: Two times a day (BID) | ORAL | Status: DC

## 2012-02-27 MED ORDER — CLONIDINE HCL 0.2 MG PO TABS: 0.2000 mg | ORAL_TABLET | Freq: Three times a day (TID) | ORAL | Status: DC

## 2012-05-06 ENCOUNTER — Ambulatory Visit: Payer: Medicare Other | Admitting: Cardiology

## 2012-05-07 ENCOUNTER — Other Ambulatory Visit: Payer: Self-pay

## 2012-05-07 MED ORDER — RAMIPRIL 10 MG PO CAPS: 10.0000 mg | ORAL_CAPSULE | Freq: Every day | ORAL | Status: DC

## 2012-05-20 ENCOUNTER — Other Ambulatory Visit: Payer: Self-pay

## 2012-05-20 MED ORDER — AMLODIPINE BESYLATE 5 MG PO TABS: 5.0000 mg | ORAL_TABLET | Freq: Every day | ORAL | Status: DC

## 2012-06-16 ENCOUNTER — Ambulatory Visit (INDEPENDENT_AMBULATORY_CARE_PROVIDER_SITE_OTHER): Payer: Medicare Other | Admitting: Cardiology

## 2012-06-16 ENCOUNTER — Encounter: Payer: Self-pay | Admitting: Cardiology

## 2012-06-16 VITALS — BP 169/68 | HR 62 | Ht 64.0 in | Wt 195.8 lb

## 2012-06-16 DIAGNOSIS — I2510 Atherosclerotic heart disease of native coronary artery without angina pectoris: Secondary | ICD-10-CM

## 2012-06-16 DIAGNOSIS — I447 Left bundle-branch block, unspecified: Secondary | ICD-10-CM

## 2012-06-16 DIAGNOSIS — Z01810 Encounter for preprocedural cardiovascular examination: Secondary | ICD-10-CM

## 2012-06-16 DIAGNOSIS — I428 Other cardiomyopathies: Secondary | ICD-10-CM

## 2012-06-16 DIAGNOSIS — I498 Other specified cardiac arrhythmias: Secondary | ICD-10-CM

## 2012-06-16 DIAGNOSIS — I10 Essential (primary) hypertension: Secondary | ICD-10-CM

## 2012-06-16 DIAGNOSIS — R001 Bradycardia, unspecified: Secondary | ICD-10-CM

## 2012-06-16 DIAGNOSIS — I422 Other hypertrophic cardiomyopathy: Secondary | ICD-10-CM

## 2012-06-16 NOTE — Progress Notes (Signed)
HPI   The patient is seen back in followup history of cardiomyopathy and CHF. She is also seen after her hip surgery in January, 2014. She's also seen for preoperative clearance for knee surgery in August, 2014. When she had her hip surgery in January, 2014 she did have some excess bleeding. Her hospitalization was prolonged for 2 days. However ultimately she recovered very well. She does well using her diuretics.  She has some residual swelling in the left leg that appears to be postoperative. It is improving over time. She's not having any chest pain or shortness of breath.  As part of today's evaluation I reevaluated all of her prior issues.  Allergies  Allergen Reactions  . Cephalexin     REACTION: Rash to arms/legs    Current Outpatient Prescriptions  Medication Sig Dispense Refill  . amLODipine (NORVASC) 5 MG tablet Take 1 tablet (5 mg total) by mouth daily.  30 tablet  6  . Ascorbic Acid (VITAMIN C) 500 MG tablet Take 500 mg by mouth daily.        [name] aspirin 81 MG tablet Take 81 mg by mouth daily.        [name] azelastine (OPTIVAR) 0.05 % ophthalmic solution Place 1 drop into both eyes 2 (two) times daily.      . Calcium Carbonate-Vitamin D (CALCIUM 600/VITAMIN D) 600-400 MG-UNIT per tablet Take 1 tablet by mouth 2 (two) times daily.        . carvedilol (COREG) 3.125 MG tablet Take 1 tablet (3.125 mg total) by mouth 2 (two) times daily with a meal.  60 tablet  3  . cloNIDine (CATAPRES) 0.2 MG tablet Take 1 tablet (0.2 mg total) by mouth 3 (three) times daily.  90 tablet  6  . ferrous sulfate 325 (65 FE) MG tablet Take 325 mg by mouth daily.        . furosemide (LASIX) 80 MG tablet TAKE 1& 1/2 TABLET EVERY MORNING & TAKE 1 TABLET EVERY EVENING  75 tablet  5  . Glucosamine-Chondroitin 750-600 MG TABS Take 1 tablet by mouth daily. MOVE FREE      . hydrALAZINE (APRESOLINE) 50 MG tablet Take 1 tablet (50 mg total) by mouth 3 (three) times daily.  90 tablet  5  . isosorbide mononitrate (IMDUR)  60 MG 24 hr tablet Take 1 tablet (60 mg total) by mouth daily.  30 tablet  6  . potassium chloride SA (K-DUR,KLOR-CON) 20 MEQ tablet TAKE 2 TABLETS BY MOUTH TWICE DAILY  120 tablet  6  . ramipril (ALTACE) 10 MG capsule Take 1 capsule (10 mg total) by mouth daily.  30 capsule  6  . vitamin E 400 UNIT capsule Take 400 Units by mouth daily.         No current facility-administered medications for this visit.    History   Social History  . Marital Status: Married    Spouse Name: N/A    Number of Children: 0  . Years of Education: N/A   Occupational History  . Not on file.   Social History Main Topics  . Smoking status: Former Smoker -- 1.00 packs/day for 24 years    Types: Cigarettes    Quit date: 12/02/1985  . Smokeless tobacco: Never Used  . Alcohol Use: No  . Drug Use: Not on file  . Sexually Active: Not on file   Other Topics Concern  . Not on file   Social History Narrative  . No narrative on  file    No family history on file.  Past Medical History  Diagnosis Date  . Ejection fraction     EF 25%...nondiagnostic MRI December 2009...  /  echo June, 2010  /   echo... December 08, 2009.... ejection fraction improved... EF 60% /  EF 30%... echo.... [place]... December 28, 2009 with CHFPLanned ICD / CRT... patient has seen Dr.Klein..EF improved October, 2011   . Cardiomyopathy, nonischemic     EF improved October, 2011  . Asymmetric septal hypertrophy     echo....04/2009....patient encouraged the family to be screened elsewhere  . LVH (left ventricular hypertrophy)     moderately severe... echo.. June, 2010  /  EF improved October, 2011.... cancel plans for ICD  . Systolic heart failure     chronic  . Hypertension   . Mitral regurgitation     mild/ moderate...echo June, 2010  /   echo.. October, 2011.... mitral regurgitation  improved  . Aortic insufficiency     mild...echo... June 2 010  . Renal artery stenosis     80% upper left   . Groin hematoma      Right-hematoma/abscess..repaired.. December, 2009  . Anemia     further workup needed... October, 2011.  [name] LBBB (left bundle branch block)   . OSA (obstructive sleep apnea)   . Arthritis     Hips/Knees, severe, requiring a walker  . Bradycardia   . Acute renal failure      hospitalized... December 08, 2009... improved with hydration in the hospital  . Atrial fibrillation      ??? atrial fibrillation during hospitalization October, 2011 ???..  . CAD (coronary artery disease)     Mild coronary disease, catheterization, 2009    Past Surgical History  Procedure Laterality Date  . Abdominal hysterectomy    . Surgical repair of a catheteriztion associated femoral artery injury      Patient Active Problem List  Diagnosis  . AORTIC INSUFFICIENCY  . LBBB  . RENAL ARTERY STENOSIS  . HYPOKALEMIA  . Cardiomyopathy, nonischemic  . Asymmetric septal hypertrophy  . LVH (left ventricular hypertrophy)  . Systolic heart failure  . Hypertension  . Mitral regurgitation  . Renal artery stenosis  . Anemia  . LBBB (left bundle branch block)  . OSA (obstructive sleep apnea)  . Bradycardia  . Atrial fibrillation  . Ejection fraction  . Preop cardiovascular exam  . CAD (coronary artery disease)    ROS   Patient denies fever, chills, headache, sweats, rash, change in vision, change in hearing, chest pain, cough, nausea vomiting, urinary symptoms. All other systems are reviewed and are negative.  PHYSICAL EXAM   Patient is oriented to person time and place. Affect is normal. She has some mild chronic swelling of her upper eyelids. This is not changed. There is no jugulovenous distention. Lungs are clear. Respiratory effort is nonlabored. Cardiac exam reveals S1 and S2. There is a systolic murmur. The abdomen is soft. There is 1+ peripheral edema in the left lower extremity. She comes in with using her walker. There is slight discoloration of her skin in her ankles related to chronic venous  change.  Filed Vitals:   06/16/12 1338  BP: 169/68  Pulse: 62  Height: 5\' 4"  (1.626 m)  Weight: 195 lb 12.8 oz (88.814 kg)   EKG is done today and reviewed by me. She has old left bundle branch block. She has sinus rhythm today. There is no significant change.  ASSESSMENT & PLAN

## 2012-06-16 NOTE — Assessment & Plan Note (Signed)
We know she has very minimal coronary disease from catheterization 2009. No further workup is needed.

## 2012-06-16 NOTE — Assessment & Plan Note (Signed)
I have carefully reviewed all of her history and her current assessment. I do feel that she is stable to undergo knee surgery in August, 2014.

## 2012-06-16 NOTE — Patient Instructions (Addendum)

## 2012-06-16 NOTE — Assessment & Plan Note (Signed)
The patient has mild chronic sinus bradycardia. She has never had significant symptoms related to this.

## 2012-06-16 NOTE — Assessment & Plan Note (Signed)
Originally the patient had left ventricular dysfunction. However over time her ejection fraction has improved to the 40-45% range.

## 2012-06-16 NOTE — Assessment & Plan Note (Signed)
Her blood pressure was normal with multiple checks with rehabilitation at her home. Her blood pressure was normal at home when she took it today. I will not change her medicines.

## 2012-06-16 NOTE — Assessment & Plan Note (Signed)
Patient has old left bundle branch block with a fairly wide QRS.

## 2012-06-16 NOTE — Assessment & Plan Note (Signed)
Historically the patient's murmur is from asymmetric septal hypertrophy. She has never had high grade outflow tract obstruction. Her family has been notified to be screened.

## 2012-07-07 ENCOUNTER — Other Ambulatory Visit: Payer: Self-pay | Admitting: *Deleted

## 2012-07-07 MED ORDER — CARVEDILOL 3.125 MG PO TABS: 3.1250 mg | ORAL_TABLET | Freq: Two times a day (BID) | ORAL | Status: DC

## 2012-07-09 ENCOUNTER — Other Ambulatory Visit: Payer: Self-pay

## 2012-07-09 MED ORDER — POTASSIUM CHLORIDE CRYS ER 20 MEQ PO TBCR: 40.0000 meq | EXTENDED_RELEASE_TABLET | Freq: Two times a day (BID) | ORAL | Status: DC

## 2012-07-13 ENCOUNTER — Ambulatory Visit: Payer: Medicare Other | Admitting: Physician Assistant

## 2012-08-27 ENCOUNTER — Other Ambulatory Visit: Payer: Self-pay | Admitting: Cardiology

## 2012-09-22 ENCOUNTER — Other Ambulatory Visit: Payer: Self-pay | Admitting: Cardiology

## 2012-10-24 ENCOUNTER — Other Ambulatory Visit: Payer: Self-pay | Admitting: Cardiology

## 2012-11-18 ENCOUNTER — Other Ambulatory Visit: Payer: Self-pay | Admitting: Cardiology

## 2012-11-29 ENCOUNTER — Other Ambulatory Visit: Payer: Self-pay | Admitting: Cardiology

## 2012-12-18 ENCOUNTER — Ambulatory Visit (INDEPENDENT_AMBULATORY_CARE_PROVIDER_SITE_OTHER): Payer: Medicare Other | Admitting: Cardiology

## 2012-12-18 ENCOUNTER — Encounter: Payer: Self-pay | Admitting: Cardiology

## 2012-12-18 VITALS — BP 159/73 | HR 55 | Ht 65.0 in | Wt 201.0 lb

## 2012-12-18 DIAGNOSIS — I340 Nonrheumatic mitral (valve) insufficiency: Secondary | ICD-10-CM

## 2012-12-18 DIAGNOSIS — I498 Other specified cardiac arrhythmias: Secondary | ICD-10-CM

## 2012-12-18 DIAGNOSIS — I5042 Chronic combined systolic (congestive) and diastolic (congestive) heart failure: Secondary | ICD-10-CM | POA: Insufficient documentation

## 2012-12-18 DIAGNOSIS — I701 Atherosclerosis of renal artery: Secondary | ICD-10-CM

## 2012-12-18 DIAGNOSIS — I509 Heart failure, unspecified: Secondary | ICD-10-CM

## 2012-12-18 DIAGNOSIS — I10 Essential (primary) hypertension: Secondary | ICD-10-CM

## 2012-12-18 DIAGNOSIS — R001 Bradycardia, unspecified: Secondary | ICD-10-CM

## 2012-12-18 DIAGNOSIS — I059 Rheumatic mitral valve disease, unspecified: Secondary | ICD-10-CM

## 2012-12-18 DIAGNOSIS — I447 Left bundle-branch block, unspecified: Secondary | ICD-10-CM

## 2012-12-18 DIAGNOSIS — I2510 Atherosclerotic heart disease of native coronary artery without angina pectoris: Secondary | ICD-10-CM

## 2012-12-18 DIAGNOSIS — I4891 Unspecified atrial fibrillation: Secondary | ICD-10-CM

## 2012-12-18 NOTE — Assessment & Plan Note (Signed)
The patient has renal artery stenosis with 80% stenosis of a left upper branch. Her blood pressure is controlled. No change in therapy.

## 2012-12-18 NOTE — Assessment & Plan Note (Signed)
In the past the patient has had ejection fraction that has changed. Most recently he was in the range of 45%. She is on appropriate medications. Her volume status is stable at this time. No change in therapy.

## 2012-12-18 NOTE — Assessment & Plan Note (Signed)
Left bundle-branch block is chronic. No change in therapy.

## 2012-12-18 NOTE — Progress Notes (Signed)
HPI  Patient is seen today to followup with a history of cardiomyopathy and CHF. She also has left bundle branch block. I saw her last in April, 2014, clearing her for another orthopedic procedure. She is now successfully had replacement of her left hip and both knees. She's had spinal anesthesia with all of the operations. She's done well. Her volume status is controlled at this time.  Allergies  Allergen Reactions  . Cephalexin     REACTION: Rash to arms/legs    Current Outpatient Prescriptions  Medication Sig Dispense Refill  . acetaminophen (TYLENOL) 500 MG tablet Take 500 mg by mouth at bedtime.      [name] amLODipine (NORVASC) 5 MG tablet Take 1 tablet (5 mg total) by mouth daily.  30 tablet  6  . Ascorbic Acid (VITAMIN C) 500 MG tablet Take 500 mg by mouth daily.        [name] aspirin 81 MG tablet Take 81 mg by mouth daily.        [name] azelastine (OPTIVAR) 0.05 % ophthalmic solution Place 1 drop into both eyes 2 (two) times daily.      . Calcium Carbonate-Vitamin D (CALCIUM 600/VITAMIN D) 600-400 MG-UNIT per tablet Take 1 tablet by mouth 2 (two) times daily.        . carvedilol (COREG) 3.125 MG tablet Take 1 tablet (3.125 mg total) by mouth 2 (two) times daily with a meal.  60 tablet  6  . cloNIDine (CATAPRES) 0.2 MG tablet TAKE 1 TABLET BY MOUTH THREE TIMES DAILY  90 tablet  3  . ferrous sulfate 325 (65 FE) MG tablet Take 325 mg by mouth daily.        . furosemide (LASIX) 80 MG tablet TAKE 1 TABLETS BY MOUTH EVERY MORNING AND 1 TABLET EVERY EVENING      . hydrALAZINE (APRESOLINE) 50 MG tablet TAKE 1 TABLET BY MOUTH THREE TIMES DAILY  90 tablet  3  . isosorbide mononitrate (IMDUR) 60 MG 24 hr tablet Take 1 tablet (60 mg total) by mouth daily.  30 tablet  6  . Multiple Vitamin (MULTIVITAMIN) tablet Take 1 tablet by mouth daily.      . potassium chloride SA (K-DUR,KLOR-CON) 20 MEQ tablet Take 2 tablets (40 mEq total) by mouth 2 (two) times daily.  120 tablet  11  . ramipril (ALTACE) 10 MG  capsule TAKE 1 CAPSULE BY MOUTH DAILY  30 capsule  0  . vitamin E 400 UNIT capsule Take 400 Units by mouth daily.         No current facility-administered medications for this visit.    History   Social History  . Marital Status: Married    Spouse Name: N/A    Number of Children: 0  . Years of Education: N/A   Occupational History  . Not on file.   Social History Main Topics  . Smoking status: Former Smoker -- 1.00 packs/day for 24 years    Types: Cigarettes    Quit date: 12/02/1985  . Smokeless tobacco: Never Used  . Alcohol Use: No  . Drug Use: Not on file  . Sexual Activity: Not on file   Other Topics Concern  . Not on file   Social History Narrative  . No narrative on file    No family history on file.  Past Medical History  Diagnosis Date  . Ejection fraction     EF 25%...nondiagnostic MRI December 2009...  /  echo June, 2010  /  echo... December 08, 2009.... ejection fraction improved... EF 60% /  EF 30%... echo.... [place]... December 28, 2009 with CHFPLanned ICD / CRT... patient has seen Dr.Klein..EF improved October, 2011   . Cardiomyopathy, nonischemic     EF improved October, 2011  . Asymmetric septal hypertrophy     echo....04/2009....patient encouraged the family to be screened elsewhere  . LVH (left ventricular hypertrophy)     moderately severe... echo.. June, 2010  /  EF improved October, 2011.... cancel plans for ICD  . Systolic heart failure     chronic  . Hypertension   . Mitral regurgitation     mild/ moderate...echo June, 2010  /   echo.. October, 2011.... mitral regurgitation  improved  . Aortic insufficiency     mild...echo... June 2 010  . Renal artery stenosis     80% upper left   . Groin hematoma     Right-hematoma/abscess..repaired.. December, 2009  . Anemia     further workup needed... October, 2011.  [name] LBBB (left bundle branch block)   . OSA (obstructive sleep apnea)   . Arthritis     Hips/Knees, severe, requiring a walker    . Bradycardia   . Acute renal failure      hospitalized... December 08, 2009... improved with hydration in the hospital  . Atrial fibrillation      ??? atrial fibrillation during hospitalization October, 2011 ???..  . CAD (coronary artery disease)     Mild coronary disease, catheterization, 2009    Past Surgical History  Procedure Laterality Date  . Abdominal hysterectomy    . Surgical repair of a catheteriztion associated femoral artery injury      Patient Active Problem List   Diagnosis Date Noted  . CAD (coronary artery disease)   . Ejection fraction   . Cardiomyopathy, nonischemic   . Asymmetric septal hypertrophy   . LVH (left ventricular hypertrophy)   . Systolic heart failure   . Hypertension   . Mitral regurgitation   . Renal artery stenosis   . Anemia   . LBBB (left bundle branch block)   . OSA (obstructive sleep apnea)   . Bradycardia   . Atrial fibrillation   . HYPOKALEMIA 02/14/2010  . AORTIC INSUFFICIENCY 11/21/2008  . RENAL ARTERY STENOSIS 11/21/2008    ROS   Patient denies fever, chills, headache, sweats, rash, change in vision, change in hearing, chest pain, cough, nausea vomiting, urinary symptoms. All other systems are reviewed and are negative.  PHYSICAL EXAM  Patient is oriented to person time and place. Affect is normal. There is no jugulovenous distention. The patient has chronic swelling of her upper eyelids. This is unchanged. Cardiac exam reveals an S1 and S2. There no clicks or significant murmurs. Lungs are clear. Respiratory effort is nonlabored. Abdomen is soft. There is no peripheral edema. The patient came in with her rolling walker.  Filed Vitals:   12/18/12 0927  BP: 159/73  Pulse: 55  Height: 5\' 5"  (1.651 m)  Weight: 201 lb (91.173 kg)   EKG is done today and reviewed by me. She has sinus rhythm with sinus bradycardia. There is old left bundle branch block. There is no significant change from the past.  ASSESSMENT & PLAN

## 2012-12-18 NOTE — Assessment & Plan Note (Signed)
She had only minimal coronary disease by cath in 2009. No further workup needed.

## 2012-12-18 NOTE — Assessment & Plan Note (Signed)
She has sinus bradycardia. It is asymptomatic. No further workup needed.

## 2012-12-18 NOTE — Assessment & Plan Note (Signed)
The patient has mild mitral regurgitation. She does not need a followup echo at this time.

## 2012-12-18 NOTE — Patient Instructions (Signed)

## 2012-12-18 NOTE — Assessment & Plan Note (Signed)
Blood pressure is controlled. I have reviewed her medicines. I have chosen not to make changes at this time.

## 2012-12-25 ENCOUNTER — Other Ambulatory Visit: Payer: Self-pay

## 2012-12-25 ENCOUNTER — Other Ambulatory Visit: Payer: Self-pay | Admitting: Cardiology

## 2012-12-25 ENCOUNTER — Telehealth: Payer: Self-pay | Admitting: *Deleted

## 2012-12-25 MED ORDER — AMLODIPINE BESYLATE 5 MG PO TABS: 5.0000 mg | ORAL_TABLET | Freq: Every day | ORAL | Status: DC

## 2012-12-25 MED ORDER — CARVEDILOL 3.125 MG PO TABS: 3.1250 mg | ORAL_TABLET | Freq: Two times a day (BID) | ORAL | Status: DC

## 2012-12-28 NOTE — Telephone Encounter (Signed)
Request fulfilled.

## 2013-02-19 ENCOUNTER — Other Ambulatory Visit: Payer: Self-pay | Admitting: Cardiology

## 2013-03-21 ENCOUNTER — Other Ambulatory Visit: Payer: Self-pay | Admitting: Cardiology

## 2013-06-22 ENCOUNTER — Other Ambulatory Visit: Payer: Self-pay | Admitting: Cardiology

## 2013-06-30 ENCOUNTER — Other Ambulatory Visit: Payer: Self-pay | Admitting: Cardiology

## 2013-07-18 ENCOUNTER — Other Ambulatory Visit: Payer: Self-pay | Admitting: Cardiology

## 2013-07-26 ENCOUNTER — Other Ambulatory Visit: Payer: Self-pay | Admitting: Cardiology

## 2013-07-29 ENCOUNTER — Encounter: Payer: Self-pay | Admitting: Cardiology

## 2013-07-29 ENCOUNTER — Encounter: Payer: Self-pay | Admitting: *Deleted

## 2013-07-29 ENCOUNTER — Ambulatory Visit (INDEPENDENT_AMBULATORY_CARE_PROVIDER_SITE_OTHER): Payer: Medicare Other | Admitting: Cardiology

## 2013-07-29 VITALS — BP 119/70 | HR 50 | Ht 65.0 in | Wt 189.1 lb

## 2013-07-29 DIAGNOSIS — I498 Other specified cardiac arrhythmias: Secondary | ICD-10-CM

## 2013-07-29 DIAGNOSIS — I2510 Atherosclerotic heart disease of native coronary artery without angina pectoris: Secondary | ICD-10-CM

## 2013-07-29 DIAGNOSIS — I509 Heart failure, unspecified: Secondary | ICD-10-CM

## 2013-07-29 DIAGNOSIS — E876 Hypokalemia: Secondary | ICD-10-CM

## 2013-07-29 DIAGNOSIS — I701 Atherosclerosis of renal artery: Secondary | ICD-10-CM

## 2013-07-29 DIAGNOSIS — Z13220 Encounter for screening for lipoid disorders: Secondary | ICD-10-CM

## 2013-07-29 DIAGNOSIS — I340 Nonrheumatic mitral (valve) insufficiency: Secondary | ICD-10-CM

## 2013-07-29 DIAGNOSIS — I10 Essential (primary) hypertension: Secondary | ICD-10-CM

## 2013-07-29 DIAGNOSIS — I428 Other cardiomyopathies: Secondary | ICD-10-CM

## 2013-07-29 DIAGNOSIS — I5042 Chronic combined systolic (congestive) and diastolic (congestive) heart failure: Secondary | ICD-10-CM

## 2013-07-29 DIAGNOSIS — I4891 Unspecified atrial fibrillation: Secondary | ICD-10-CM

## 2013-07-29 DIAGNOSIS — R001 Bradycardia, unspecified: Secondary | ICD-10-CM

## 2013-07-29 DIAGNOSIS — I359 Nonrheumatic aortic valve disorder, unspecified: Secondary | ICD-10-CM

## 2013-07-29 DIAGNOSIS — E663 Overweight: Secondary | ICD-10-CM | POA: Insufficient documentation

## 2013-07-29 DIAGNOSIS — I059 Rheumatic mitral valve disease, unspecified: Secondary | ICD-10-CM

## 2013-07-29 NOTE — Assessment & Plan Note (Signed)
There was only question of atrial fibrillation in the past. We are not seeing this rhythm

## 2013-07-29 NOTE — Assessment & Plan Note (Signed)
She has renal artery stenosis of one of her left upper renal artery branches. No further workup.

## 2013-07-29 NOTE — Assessment & Plan Note (Signed)
Blood pressure is nicely controlled. No change in therapy.

## 2013-07-29 NOTE — Progress Notes (Signed)
Patient ID: Gina [name], female   DOB: 07-Dec-1940, 73 y.o.   MRN: [id number]    HPI   The patient is seen today to followup her history of cardiomyopathy and CHF. She has chronic asymptomatic sinus bradycardia. She is losing weight at [employer]. She's feeling well. She has no pain in her legs after her multiple orthopedic procedures. Blood pressure is controlled.  Allergies  Allergen Reactions  . Cephalexin     REACTION: Rash to arms/legs    Current Outpatient Prescriptions  Medication Sig Dispense Refill  . acetaminophen (TYLENOL) 500 MG tablet Take 500 mg by mouth at bedtime.      [name] amLODipine (NORVASC) 5 MG tablet TAKE 1 TABLET BY MOUTH DAILY  30 tablet  3  . Ascorbic Acid (VITAMIN C) 500 MG tablet Take 500 mg by mouth daily.        [name] aspirin 81 MG tablet Take 81 mg by mouth daily.        [name] azelastine (OPTIVAR) 0.05 % ophthalmic solution Place 1 drop into both eyes 2 (two) times daily.      . Calcium Carbonate-Vitamin D (CALCIUM 600/VITAMIN D) 600-400 MG-UNIT per tablet Take 1 tablet by mouth 2 (two) times daily.        . carvedilol (COREG) 3.125 MG tablet Take 1 tablet (3.125 mg total) by mouth 2 (two) times daily with a meal.  60 tablet  6  . cloNIDine (CATAPRES) 0.2 MG tablet TAKE 1 TABLET BY MOUTH THREE TIMES DAILY  90 tablet  3  . ferrous sulfate 325 (65 FE) MG tablet Take 325 mg by mouth daily.        . furosemide (LASIX) 80 MG tablet take one tab by mouth twice a day      . hydrALAZINE (APRESOLINE) 50 MG tablet TAKE 1 TABLET BY MOUTH THREE TIMES DAILY  90 tablet  3  . isosorbide mononitrate (IMDUR) 60 MG 24 hr tablet TAKE 1 TABLET BY MOUTH DAILY  30 tablet  6  . Multiple Vitamin (MULTIVITAMIN) tablet Take 1 tablet by mouth daily.      . potassium chloride SA (K-DUR,KLOR-CON) 20 MEQ tablet TAKE 2 TABLETS BY MOUTH TWICE DAILY  120 tablet  3  . ramipril (ALTACE) 10 MG capsule TAKE ONE CAPSULE BY MOUTH DAILY  30 capsule  6   No current facility-administered medications  for this visit.    History   Social History  . Marital Status: Married    Spouse Name: N/A    Number of Children: 0  . Years of Education: N/A   Occupational History  . Not on file.   Social History Main Topics  . Smoking status: Former Smoker -- 1.00 packs/day for 24 years    Types: Cigarettes    Quit date: 12/02/1985  . Smokeless tobacco: Never Used  . Alcohol Use: No  . Drug Use: Not on file  . Sexual Activity: Not on file   Other Topics Concern  . Not on file   Social History Narrative  . No narrative on file    No family history on file.  Past Medical History  Diagnosis Date  . Ejection fraction     EF 25%...nondiagnostic MRI December 2009...  /  echo June, 2010  /   echo... December 08, 2009.... ejection fraction improved... EF 60% /  EF 30%... echo.... [place]... December 28, 2009 with CHFPLanned ICD / CRT... patient has seen Dr.Klein..EF improved October, 2011   . Cardiomyopathy, nonischemic  EF improved October, 2011  . Asymmetric septal hypertrophy     echo....04/2009....patient encouraged the family to be screened elsewhere  . LVH (left ventricular hypertrophy)     moderately severe... echo.. June, 2010  /  EF improved October, 2011.... cancel plans for ICD  . Systolic heart failure     chronic  . Hypertension   . Mitral regurgitation     mild/ moderate...echo June, 2010  /   echo.. October, 2011.... mitral regurgitation  improved  . Aortic insufficiency     mild...echo... June 2 010  . Renal artery stenosis     80% upper left   . Groin hematoma     Right-hematoma/abscess..repaired.. December, 2009  . Anemia     further workup needed... October, 2011.  [name] LBBB (left bundle branch block)   . OSA (obstructive sleep apnea)   . Arthritis     Hips/Knees, severe, requiring a walker  . Bradycardia   . Acute renal failure      hospitalized... December 08, 2009... improved with hydration in the hospital  . Atrial fibrillation      ??? atrial  fibrillation during hospitalization October, 2011 ???..  . CAD (coronary artery disease)     Mild coronary disease, catheterization, 2009    Past Surgical History  Procedure Laterality Date  . Abdominal hysterectomy    . Surgical repair of a catheteriztion associated femoral artery injury      Patient Active Problem List   Diagnosis Date Noted  . Overweight 07/29/2013  . Chronic combined systolic and diastolic CHF (congestive heart failure) 12/18/2012  . CAD (coronary artery disease)   . Ejection fraction   . Cardiomyopathy, nonischemic   . Asymmetric septal hypertrophy   . LVH (left ventricular hypertrophy)   . Hypertension   . Mitral regurgitation   . Renal artery stenosis   . Anemia   . LBBB (left bundle branch block)   . OSA (obstructive sleep apnea)   . Bradycardia   . Atrial fibrillation   . HYPOKALEMIA 02/14/2010  . AORTIC INSUFFICIENCY 11/21/2008    ROS   Patient denies fever, chills, headache, sweats, rash, change in vision, change in hearing, chest pain, cough, nausea or vomiting, urinary symptoms. All other systems are reviewed and are negative.  PHYSICAL EXAM  Patient continues to use a rolling walker as needed. She is oriented to person time and place. Affect is normal. There is no jugular venous distention. Head is atraumatic. Sclera and conjunctiva are normal. Her eyelids are droopy. However this is less marked than the past. Lungs are clear. Respiratory effort is not labored. Cardiac exam reveals S1 and S2. The rate is slow. Abdomen is soft. There is trace peripheral edema. There are chronic skin changes in her lower extremities.  Filed Vitals:   07/29/13 0905  BP: 119/70  Pulse: 50  Height: 5\' 5"  (1.651 m)  Weight: 189 lb 1.9 oz (85.784 kg)  SpO2: 96%   EKG is done today and reviewed by me. She has old sinus bradycardia. She has old left bundle branch block.  ASSESSMENT & PLAN

## 2013-07-29 NOTE — Assessment & Plan Note (Signed)
The patient has resting bradycardia. She's asymptomatic. There's been no syncope or presyncope. She is on medications to slow her rate including her carvedilol and clonidine. She is doing so well with these I feel it is not prudent to change at this time.

## 2013-07-29 NOTE — Assessment & Plan Note (Signed)
There has been mild aortic insufficiency in the past. We will consider a followup echo in 6 months.

## 2013-07-29 NOTE — Assessment & Plan Note (Signed)
Labs will be checked today.

## 2013-07-29 NOTE — Assessment & Plan Note (Signed)
Her volume status is stable. No further workup.

## 2013-07-29 NOTE — Assessment & Plan Note (Signed)
Historically her ejection fraction improved to 40-45% range by echo in 2012. It will be time to reassess with an echo possibly in 6 months. She is on appropriate medications.

## 2013-07-29 NOTE — Assessment & Plan Note (Signed)
I had a careful discussion with the patient. Currently her weight is down to 189. I feel that an optimal weight for her would be in the range of 165 pounds.  As part of today's evaluation I spent greater than 25 minutes with her total care. More than half of this time has been with direct contact with her discussing all aspects of her care including her weight.

## 2013-07-29 NOTE — Assessment & Plan Note (Signed)
Her mitral regurgitation was only mild in 2012. This will be reassessed when we consider an echo in 6 months.

## 2013-07-29 NOTE — Patient Instructions (Signed)
   Your physician recommends that you schedule a follow-up appointment in: 6 months. You will receive a reminder letter in the mail in about 4 months reminding you to call and schedule your appointment. If you don't receive this letter, please contact our office. Your physician recommends that you continue on your current medications as directed. Please refer to the Current Medication list given to you today. Your physician recommends that you have FASTING lipid profile, BMET,CBC and TSH.

## 2013-07-29 NOTE — Assessment & Plan Note (Signed)
She had minimal coronary disease in the past. No further workup.

## 2013-08-05 ENCOUNTER — Telehealth: Payer: Self-pay | Admitting: *Deleted

## 2013-08-05 NOTE — Telephone Encounter (Signed)
Message copied by Merlene Laughter on Thu Aug 05, 2013  4:13 PM ------      Message from: [place], [place] D      Created: Sun Aug 01, 2013  2:00 PM       Please let the patient know that the bloodwork is good kidney function and potassium look good. Cholesterol is okay. Thyroid test is normal ------

## 2013-08-05 NOTE — Telephone Encounter (Signed)
Patient informed. 

## 2013-08-20 ENCOUNTER — Other Ambulatory Visit: Payer: Self-pay | Admitting: Cardiology

## 2013-09-17 ENCOUNTER — Other Ambulatory Visit: Payer: Self-pay | Admitting: Cardiology

## 2013-11-19 ENCOUNTER — Other Ambulatory Visit: Payer: Self-pay | Admitting: Cardiology

## 2013-12-18 ENCOUNTER — Other Ambulatory Visit: Payer: Self-pay | Admitting: Cardiology

## 2014-01-14 ENCOUNTER — Other Ambulatory Visit: Payer: Self-pay | Admitting: Cardiology

## 2014-01-17 ENCOUNTER — Ambulatory Visit (INDEPENDENT_AMBULATORY_CARE_PROVIDER_SITE_OTHER): Payer: Medicare Other | Admitting: Cardiology

## 2014-01-17 ENCOUNTER — Encounter: Payer: Self-pay | Admitting: Cardiology

## 2014-01-17 VITALS — BP 126/68 | HR 52 | Ht 65.0 in | Wt 185.0 lb

## 2014-01-17 DIAGNOSIS — I428 Other cardiomyopathies: Secondary | ICD-10-CM

## 2014-01-17 DIAGNOSIS — R001 Bradycardia, unspecified: Secondary | ICD-10-CM

## 2014-01-17 DIAGNOSIS — I5042 Chronic combined systolic (congestive) and diastolic (congestive) heart failure: Secondary | ICD-10-CM

## 2014-01-17 DIAGNOSIS — G4733 Obstructive sleep apnea (adult) (pediatric): Secondary | ICD-10-CM

## 2014-01-17 DIAGNOSIS — I340 Nonrheumatic mitral (valve) insufficiency: Secondary | ICD-10-CM

## 2014-01-17 DIAGNOSIS — I2510 Atherosclerotic heart disease of native coronary artery without angina pectoris: Secondary | ICD-10-CM

## 2014-01-17 DIAGNOSIS — I429 Cardiomyopathy, unspecified: Secondary | ICD-10-CM

## 2014-01-17 NOTE — Patient Instructions (Addendum)
   Your physician has requested that you have an echocardiogram. Echocardiography is a painless test that uses sound waves to create images of your heart. It provides your doctor with information about the size and shape of your heart and how well your heart's chambers and valves are working. This procedure takes approximately one hour. There are no restrictions for this procedure. Office will contact with results via phone or letter.   Continue all current medications. Your physician wants you to follow up in: 6 months.  You will receive a reminder letter in the mail one-two months in advance.  If you don't receive a letter, please call our office to schedule the follow up appointment     

## 2014-01-17 NOTE — Progress Notes (Signed)
Patient ID: Gina [name], female   DOB: 1941/01/30, 73 y.o.   MRN: [id number]    HPI  patient is seen today to follow-up cardiomyopathy and congestive heart failure. She also has chronic asymptomatic sinus bradycardia. She has lost a few more pounds since her last visit. Her walking continues to improve. She uses her rolling walker but hopes to eventually walk on her own. She continues to do well from the viewpoint of her sleep apnea with her C Pap.  Allergies  Allergen Reactions  . Cephalexin     REACTION: Rash to arms/legs    Current Outpatient Prescriptions  Medication Sig Dispense Refill  . acetaminophen (TYLENOL) 500 MG tablet Take 500 mg by mouth at bedtime.    [name] amLODipine (NORVASC) 5 MG tablet TAKE 1 TABLET BY MOUTH DAILY 30 tablet 6  . Ascorbic Acid (VITAMIN C) 500 MG tablet Take 500 mg by mouth daily.      [name] aspirin 81 MG tablet Take 81 mg by mouth daily.      [name] azelastine (OPTIVAR) 0.05 % ophthalmic solution Place 1 drop into both eyes 2 (two) times daily.    . Calcium Carbonate-Vitamin D (CALCIUM 600/VITAMIN D) 600-400 MG-UNIT per tablet Take 1 tablet by mouth 2 (two) times daily.      . carvedilol (COREG) 3.125 MG tablet TAKE 1 TABLET BY MOUTH TWICE DAILY WITH MEALS 60 tablet 6  . cloNIDine (CATAPRES) 0.2 MG tablet TAKE 1 TABLET BY MOUTH THREE TIMES DAILY 90 tablet 6  . Docusate Sodium 100 MG capsule Take 200 mg by mouth at bedtime.    . ferrous sulfate 325 (65 FE) MG tablet Take 325 mg by mouth daily.      . furosemide (LASIX) 80 MG tablet Take 1 tablet (80 mg total) by mouth 2 (two) times daily. 75 tablet 3  . hydrALAZINE (APRESOLINE) 50 MG tablet TAKE 1 TABLET BY MOUTH THREE TIMES DAILY 90 tablet 6  . isosorbide mononitrate (IMDUR) 60 MG 24 hr tablet TAKE 1 TABLET BY MOUTH DAILY 30 tablet 6  . Multiple Vitamin (MULTIVITAMIN) tablet Take 1 tablet by mouth daily.    . potassium chloride SA (K-DUR,KLOR-CON) 20 MEQ tablet TAKE 2 TABLETS BY MOUTH TWICE DAILY 120 tablet 6    . ramipril (ALTACE) 10 MG capsule TAKE ONE CAPSULE BY MOUTH DAILY 30 capsule 6   No current facility-administered medications for this visit.    History   Social History  . Marital Status: Married    Spouse Name: N/A    Number of Children: 0  . Years of Education: N/A   Occupational History  . Not on file.   Social History Main Topics  . Smoking status: Former Smoker -- 1.00 packs/day for 24 years    Types: Cigarettes    Start date: 10/29/1958    Quit date: 12/02/1985  . Smokeless tobacco: Never Used  . Alcohol Use: No  . Drug Use: Not on file  . Sexual Activity: Not on file   Other Topics Concern  . Not on file   Social History Narrative    History reviewed. No pertinent family history.  Past Medical History  Diagnosis Date  . Ejection fraction     EF 25%...nondiagnostic MRI December 2009...  /  echo June, 2010  /   echo... December 08, 2009.... ejection fraction improved... EF 60% /  EF 30%... echo.... [place]... December 28, 2009 with CHFPLanned ICD / CRT... patient has seen Dr.Klein..EF improved October, 2011   .  Cardiomyopathy, nonischemic     EF improved October, 2011  . Asymmetric septal hypertrophy     echo....04/2009....patient encouraged the family to be screened elsewhere  . LVH (left ventricular hypertrophy)     moderately severe... echo.. June, 2010  /  EF improved October, 2011.... cancel plans for ICD  . Systolic heart failure     chronic  . Hypertension   . Mitral regurgitation     mild/ moderate...echo June, 2010  /   echo.. October, 2011.... mitral regurgitation  improved  . Aortic insufficiency     mild...echo... June 2 010  . Renal artery stenosis     80% upper left   . Groin hematoma     Right-hematoma/abscess..repaired.. December, 2009  . Anemia     further workup needed... October, 2011.  [name] LBBB (left bundle branch block)   . OSA (obstructive sleep apnea)   . Arthritis     Hips/Knees, severe, requiring a walker  . Bradycardia   .  Acute renal failure      hospitalized... December 08, 2009... improved with hydration in the hospital  . Atrial fibrillation      ??? atrial fibrillation during hospitalization October, 2011 ???..  . CAD (coronary artery disease)     Mild coronary disease, catheterization, 2009    Past Surgical History  Procedure Laterality Date  . Abdominal hysterectomy    . Surgical repair of a catheteriztion associated femoral artery injury      Patient Active Problem List   Diagnosis Date Noted  . Overweight(278.02) 07/29/2013  . Chronic combined systolic and diastolic CHF (congestive heart failure) 12/18/2012  . CAD (coronary artery disease)   . Ejection fraction   . Cardiomyopathy, nonischemic   . Asymmetric septal hypertrophy   . LVH (left ventricular hypertrophy)   . Hypertension   . Mitral regurgitation   . Renal artery stenosis   . Anemia   . LBBB (left bundle branch block)   . OSA (obstructive sleep apnea)   . Bradycardia   . Atrial fibrillation   . HYPOKALEMIA 02/14/2010  . AORTIC INSUFFICIENCY 11/21/2008    ROS   patient denies fever, chills, headache, sweats, rash, change in vision, change in hearing, chest pain, cough, nausea or vomiting, urinary symptoms. All other systems are reviewed and are negative.  PHYSICAL EXAM  The patient is oriented to person time and place. Affect is normal. She continues to look better over time. Her cardiac status is stable. Head is atraumatic. Sclera and conjunctiva are normal. There is no jugular venous distention. Lungs are clear. Respiratory effort is nonlabored. Cardiac exam reveals an S1 and S2. The abdomen is soft. There is no significant peripheral edema. She now has replacements of her left hip, left knee, and right knee. There are no skin rashes.  Filed Vitals:   01/17/14 1019  BP: 126/68  Pulse: 52  Height: 5\' 5"  (1.651 m)  Weight: 185 lb (83.915 kg)  SpO2: 98%     ASSESSMENT & PLAN

## 2014-01-17 NOTE — Assessment & Plan Note (Signed)
Her EF had improved to 40-45% in 2012. It is now time to do follow-up 2-D echo to reassess.

## 2014-01-17 NOTE — Assessment & Plan Note (Signed)
The patient uses C Pap at home. She has been successful with this. It is possible that this is playing a role in helping her left ventricle improve. It is definitely indicated that her C Pap should be continued. We will be sure that information is sent to her provider so that her C Pap can be continued.

## 2014-01-17 NOTE — Assessment & Plan Note (Signed)
Patient has mitral regurgitation. It had improved somewhat in 2012. It is now time for follow-up 2-D echo.

## 2014-01-17 NOTE — Assessment & Plan Note (Signed)
There is history of only mild coronary disease. No further workup.

## 2014-01-17 NOTE — Assessment & Plan Note (Signed)
Her volume status is stable. No change in therapy. 

## 2014-01-17 NOTE — Assessment & Plan Note (Signed)
Patient has chronic asymptomatic sinus bradycardia. No change in therapy.

## 2014-02-02 ENCOUNTER — Other Ambulatory Visit: Payer: Self-pay

## 2014-02-02 ENCOUNTER — Other Ambulatory Visit (INDEPENDENT_AMBULATORY_CARE_PROVIDER_SITE_OTHER): Payer: Medicare Other

## 2014-02-02 DIAGNOSIS — I2510 Atherosclerotic heart disease of native coronary artery without angina pectoris: Secondary | ICD-10-CM

## 2014-02-02 DIAGNOSIS — I5040 Unspecified combined systolic (congestive) and diastolic (congestive) heart failure: Secondary | ICD-10-CM

## 2014-02-02 DIAGNOSIS — I428 Other cardiomyopathies: Secondary | ICD-10-CM

## 2014-02-02 DIAGNOSIS — I429 Cardiomyopathy, unspecified: Secondary | ICD-10-CM

## 2014-02-02 DIAGNOSIS — I340 Nonrheumatic mitral (valve) insufficiency: Secondary | ICD-10-CM

## 2014-02-03 ENCOUNTER — Other Ambulatory Visit: Payer: Self-pay | Admitting: Cardiology

## 2014-02-18 ENCOUNTER — Encounter: Payer: Self-pay | Admitting: Cardiology

## 2014-02-18 NOTE — Progress Notes (Signed)
Patient ID: Gina [name], female   DOB: Jul 08, 1940, 73 y.o.   MRN: [id number]  Over time the patient has had variation in her left ventricular function. At one point in 2009, we thought her EF was as low as 35%. She had very significant septal and apical disc. She has a wide QRS. She was actually considered for an ICD. However her EF appeared to improve and an ICD was never placed. She continues to do well. She had a 2-D echo in 2012 that suggested better LV function. However she still had septal dyssynergy. She had a follow-up echo recently in November, 2015. This study is technically very poor. However the wall motion seems to be more consistent with her wall motion in 2009. That is the ejection fraction is more in the 35% range. There is marked septal dyssynergy and some apical dyssynergy.  It has never been clear to me why her LV function has changed over time. Also, I wonder if her wall abnormalities are related to her interventricular conduction delay.  I will recontact the patient discussed the findings with her.

## 2014-03-17 ENCOUNTER — Telehealth: Payer: Self-pay | Admitting: *Deleted

## 2014-03-17 NOTE — Telephone Encounter (Signed)
Patient informed. 

## 2014-03-17 NOTE — Telephone Encounter (Signed)
-----   Message from Carlena Bjornstad, MD sent at 03/16/2014  7:45 AM EST ----- I have not spoken with the patient get about her echo. Please contact her and ask her to come to see me in the office on February 10. I would like to review her echo with her then. She may be on a recall list for an appointment, but I do not see a definite next appointment in [facility]. So please arrange for February 10.

## 2014-03-29 ENCOUNTER — Other Ambulatory Visit: Payer: Self-pay | Admitting: Cardiology

## 2014-04-13 ENCOUNTER — Ambulatory Visit (INDEPENDENT_AMBULATORY_CARE_PROVIDER_SITE_OTHER): Payer: Medicare Other | Admitting: Cardiology

## 2014-04-13 ENCOUNTER — Encounter: Payer: Self-pay | Admitting: Cardiology

## 2014-04-13 VITALS — BP 158/90 | HR 56 | Ht 65.0 in | Wt 201.0 lb

## 2014-04-13 DIAGNOSIS — I428 Other cardiomyopathies: Secondary | ICD-10-CM

## 2014-04-13 DIAGNOSIS — I429 Cardiomyopathy, unspecified: Secondary | ICD-10-CM

## 2014-04-13 DIAGNOSIS — I5042 Chronic combined systolic (congestive) and diastolic (congestive) heart failure: Secondary | ICD-10-CM

## 2014-04-13 NOTE — Patient Instructions (Signed)
Your physician recommends that you schedule a follow-up appointment in: 8 weeks. Your physician recommends that you continue on your current medications as directed. Please refer to the Current Medication list given to you today. Your physician has requested that you randomly monitor and record your blood pressure readings daily at home. Please use the same machine at random times each day to check your readings and record them. Please send the results to the office or call the results to the office after 2 weeks of recording them.

## 2014-04-13 NOTE — Progress Notes (Signed)
Cardiology Office Note   Date:  04/13/2014   ID:  Gina [name], [name] Feb 28, 1941, MRN [id number]  PCP:  Glenda Chroman., MD  Cardiologist:  Dola Argyle, MD   Chief Complaint  Patient presents with  . Appointment    Follow-up cardiomyopathy      History of Present Illness: Gina [name] is a 74 y.o. female who presents  Today to follow-up cardiomyopathy. Historically she's had only minimal coronary disease. In the past her ejection fraction was low enough with nonischemic cardiomyopathy that ICD was considered. Close to the time that ICD was to be placed, follow-up echo revealed improving LV function. Her EF has been as high as 45%. Her volume status is stable and she's feeling well. I arranged for an echo recently. The ejection fraction is closer  To 35%. I brought her back today to discuss this issue fully. She is on multiple medications for left ventricular dysfunction. Her volume status is stable and she's not having significant symptoms. Her blood pressure is high in the office. She says that her pressure is in the 135/80 range at home. A lower pressure may be optimal for her.    Past Medical History  Diagnosis Date  . Ejection fraction     EF 25%...nondiagnostic MRI December 2009...  /  echo June, 2010  /   echo... December 08, 2009.... ejection fraction improved... EF 60% /  EF 30%... echo.... [place]... December 28, 2009 with CHFPLanned ICD / CRT... patient has seen Dr.Klein..EF improved October, 2011   . Cardiomyopathy, nonischemic     EF improved October, 2011  . Asymmetric septal hypertrophy     echo....04/2009....patient encouraged the family to be screened elsewhere  . LVH (left ventricular hypertrophy)     moderately severe... echo.. June, 2010  /  EF improved October, 2011.... cancel plans for ICD  . Systolic heart failure     chronic  . Hypertension   . Mitral regurgitation     mild/ moderate...echo June, 2010  /   echo.. October, 2011.... mitral  regurgitation  improved  . Aortic insufficiency     mild...echo... June 2 010  . Renal artery stenosis     80% upper left   . Groin hematoma     Right-hematoma/abscess..repaired.. December, 2009  . Anemia     further workup needed... October, 2011.  [name] LBBB (left bundle branch block)   . OSA (obstructive sleep apnea)   . Arthritis     Hips/Knees, severe, requiring a walker  . Bradycardia   . Acute renal failure      hospitalized... December 08, 2009... improved with hydration in the hospital  . Atrial fibrillation      ??? atrial fibrillation during hospitalization October, 2011 ???..  . CAD (coronary artery disease)     Mild coronary disease, catheterization, 2009    Past Surgical History  Procedure Laterality Date  . Abdominal hysterectomy    . Surgical repair of a catheteriztion associated femoral artery injury      Patient Active Problem List   Diagnosis Date Noted  . Overweight(278.02) 07/29/2013  . Chronic combined systolic and diastolic CHF (congestive heart failure) 12/18/2012  . CAD (coronary artery disease)   . Ejection fraction   . Cardiomyopathy, nonischemic   . Asymmetric septal hypertrophy   . LVH (left ventricular hypertrophy)   . Hypertension   . Mitral regurgitation   . Renal artery stenosis   . Anemia   . LBBB (left bundle branch block)   .  OSA (obstructive sleep apnea)   . Bradycardia   . Atrial fibrillation   . HYPOKALEMIA 02/14/2010  . AORTIC INSUFFICIENCY 11/21/2008      Current Outpatient Prescriptions  Medication Sig Dispense Refill  . acetaminophen (TYLENOL) 500 MG tablet Take 500 mg by mouth at bedtime.    [name] amLODipine (NORVASC) 5 MG tablet TAKE 1 TABLET BY MOUTH DAILY 30 tablet 6  . Ascorbic Acid (VITAMIN C) 500 MG tablet Take 500 mg by mouth daily.      [name] aspirin 81 MG tablet Take 81 mg by mouth daily.      . Calcium Carbonate-Vitamin D (CALCIUM 600/VITAMIN D) 600-400 MG-UNIT per tablet Take 1 tablet by mouth 2 (two) times daily.        . carvedilol (COREG) 3.125 MG tablet TAKE 1 TABLET BY MOUTH TWICE DAILY WITH MEALS 60 tablet 6  . cloNIDine (CATAPRES) 0.2 MG tablet TAKE 1 TABLET BY MOUTH THREE TIMES DAILY 90 tablet 6  . Docusate Sodium 100 MG capsule Take 200 mg by mouth at bedtime.    . ferrous sulfate 325 (65 FE) MG tablet Take 325 mg by mouth daily.      . furosemide (LASIX) 80 MG tablet TAKE 1 TABLET BY MOUTH TWICE DAILY 75 tablet 0  . hydrALAZINE (APRESOLINE) 50 MG tablet TAKE 1 TABLET BY MOUTH THREE TIMES DAILY 90 tablet 6  . isosorbide mononitrate (IMDUR) 60 MG 24 hr tablet TAKE 1 TABLET BY MOUTH DAILY 30 tablet 6  . Multiple Vitamin (MULTIVITAMIN) tablet Take 1 tablet by mouth daily.    . potassium chloride SA (K-DUR,KLOR-CON) 20 MEQ tablet TAKE 2 TABLETS BY MOUTH TWICE DAILY 120 tablet 6  . ramipril (ALTACE) 10 MG capsule TAKE ONE CAPSULE BY MOUTH DAILY 30 capsule 6   No current facility-administered medications for this visit.    Allergies:   Cephalexin    Social History:  The patient  reports that she quit smoking about 28 years ago. Her smoking use included Cigarettes. She started smoking about 55 years ago. She has a 24 pack-year smoking history. She has never used smokeless tobacco. She reports that she does not drink alcohol.   Family History:   There is no significant family history of coronary disease.  ROS:  Please see the history of present illness.   Patient denies fever, chills, headache, sweats, rash, change in vision, change in hearing, chest pain, cough, nausea or vomiting, urinary symptoms. All other systems are reviewed and are negative.      PHYSICAL EXAM: VS:  BP 158/90 mmHg  Pulse 56  Ht 5\' 5"  (1.651 m)  Wt 201 lb (91.173 kg)  BMI 33.45 kg/m2  SpO2 98% ,  patient is gained some weight since I saw her last. It i true body weight. She has recovered from her combination of hip surgery and bilateral knee surgery over many years. Head is atraumatic. Sclera and conjunctiva are normal. There  is no jugulovenous distention. Lungs are clear. Respiratory effort is not labored. She has slight puffiness of her eyelids. This is an old chronic problem. Cardiac exam reveals S1 and S2. The abdomen is soft. There is no peripheral edema. There are no musculoskeletal   Deformities. There are no skin rashes. Neurologic is grossly intact.  EKG:    EKG is not done today.   Recent Labs: No results found for requested labs within last 365 days.    Lipid Panel    Component Value Date/Time   CHOL  02/16/2008 1500    110        ATP III CLASSIFICATION:  <200     mg/dL   Desirable  200-239  mg/dL   Borderline High  >=240    mg/dL   High   TRIG 80 02/16/2008 1500   HDL 42 02/16/2008 1500   CHOLHDL 2.6 02/16/2008 1500   VLDL 16 02/16/2008 1500   LDLCALC  02/16/2008 1500    52        Total Cholesterol/HDL:CHD Risk Coronary Heart Disease Risk Table                     Men   Women  1/2 Average Risk   3.4   3.3      Wt Readings from Last 3 Encounters:  04/13/14 201 lb (91.173 kg)  01/17/14 185 lb (83.915 kg)  07/29/13 189 lb 1.9 oz (85.784 kg)      Current medicines are reviewed.  Patient understands her medications. No changes are being made.       ASSESSMENT AND PLAN:

## 2014-04-13 NOTE — Assessment & Plan Note (Signed)
Her volume status is stable. No change in therapy. 

## 2014-04-13 NOTE — Assessment & Plan Note (Signed)
The patient has had varying ejection fractions over the years. We are very close to putting an ICD and when her EF improved to 45%. Her symptoms have not changed. Most recently her ejection fraction is closer to 35%. Her blood pressure may be higher than we want. She will document her pressure at home for 2 weeks. Information will be sent to me. If I feel I can push her blood pressure medicines, I will proceed with this. I will then see her back for follow-up.

## 2014-05-02 ENCOUNTER — Other Ambulatory Visit: Payer: Self-pay | Admitting: *Deleted

## 2014-05-02 MED ORDER — FUROSEMIDE 80 MG PO TABS: 80.0000 mg | ORAL_TABLET | Freq: Two times a day (BID) | ORAL | Status: DC

## 2014-06-08 ENCOUNTER — Ambulatory Visit: Payer: Medicare Other | Admitting: Cardiology

## 2014-06-15 ENCOUNTER — Other Ambulatory Visit: Payer: Self-pay | Admitting: Cardiology

## 2014-07-07 ENCOUNTER — Other Ambulatory Visit: Payer: Self-pay | Admitting: Cardiology

## 2014-07-15 ENCOUNTER — Encounter: Payer: Self-pay | Admitting: Cardiology

## 2014-07-15 ENCOUNTER — Ambulatory Visit (INDEPENDENT_AMBULATORY_CARE_PROVIDER_SITE_OTHER): Payer: Medicare Other | Admitting: Cardiology

## 2014-07-15 VITALS — BP 144/81 | HR 54 | Ht 65.0 in | Wt 204.8 lb

## 2014-07-15 DIAGNOSIS — I422 Other hypertrophic cardiomyopathy: Secondary | ICD-10-CM

## 2014-07-15 DIAGNOSIS — I2510 Atherosclerotic heart disease of native coronary artery without angina pectoris: Secondary | ICD-10-CM

## 2014-07-15 DIAGNOSIS — I351 Nonrheumatic aortic (valve) insufficiency: Secondary | ICD-10-CM

## 2014-07-15 DIAGNOSIS — I701 Atherosclerosis of renal artery: Secondary | ICD-10-CM

## 2014-07-15 DIAGNOSIS — I5042 Chronic combined systolic (congestive) and diastolic (congestive) heart failure: Secondary | ICD-10-CM | POA: Diagnosis not present

## 2014-07-15 DIAGNOSIS — I447 Left bundle-branch block, unspecified: Secondary | ICD-10-CM

## 2014-07-15 DIAGNOSIS — I340 Nonrheumatic mitral (valve) insufficiency: Secondary | ICD-10-CM

## 2014-07-15 DIAGNOSIS — I4891 Unspecified atrial fibrillation: Secondary | ICD-10-CM

## 2014-07-15 DIAGNOSIS — I428 Other cardiomyopathies: Secondary | ICD-10-CM

## 2014-07-15 DIAGNOSIS — R001 Bradycardia, unspecified: Secondary | ICD-10-CM

## 2014-07-15 DIAGNOSIS — I429 Cardiomyopathy, unspecified: Secondary | ICD-10-CM

## 2014-07-15 NOTE — Assessment & Plan Note (Signed)
Catheterization in 2009 revealed only minimal coronary disease. No further workup.

## 2014-07-15 NOTE — Assessment & Plan Note (Signed)
Patient had mild aortic insufficiency by echo, December, 2015. No further workup is needed.

## 2014-07-15 NOTE — Assessment & Plan Note (Signed)
She has chronic sinus bradycardia. For this reason her beta blocker dose cannot be increased for her left ventricular dysfunction. She has not had symptoms.

## 2014-07-15 NOTE — Assessment & Plan Note (Signed)
Her volume status is stable at this time. No change in therapy.

## 2014-07-15 NOTE — Assessment & Plan Note (Signed)
There is question that she may have had atrial fibrillation during one hospitalization in 2011. This has never been documented at another time. Therefore she is not anticoagulated.

## 2014-07-15 NOTE — Assessment & Plan Note (Signed)
Historically she had asymmetric septal hypertrophy with no outflow tract obstruction. She did instruct her family members to be screened. No further workup.

## 2014-07-15 NOTE — Assessment & Plan Note (Signed)
She does have some renal artery stenosis. However it is only in a branch vessel of one of her kidneys.

## 2014-07-15 NOTE — Progress Notes (Signed)
Cardiology Office Note   Date:  07/15/2014   ID:  [name], [name] 05-Apr-1940, MRN [id number]  PCP:  Glenda Chroman., MD  Cardiologist:  Dola Argyle, MD   Chief Complaint  Patient presents with  . Appointment    Follow-up cardiomyopathy      History of Present Illness: Gina [name] is a 74 y.o. female who presents today to follow-up cardiomyopathy and CHF. Her volume status is stable. Historically she's had minimal coronary disease. In the past her ejection fraction was reduced low enough that an ICD was planned. However her ejection fraction increased to the range of 45% and an ICD was not placed. She has been clinically stable. In December, 2015, her ejection fraction was closer to the 30-35% range. Her medications have been adjusted. Her blood pressure is under reasonable control. She is not having any shortness of breath or chest pain.    Past Medical History  Diagnosis Date  . Ejection fraction     EF 25%...nondiagnostic MRI December 2009...  /  echo June, 2010  /   echo... December 08, 2009.... ejection fraction improved... EF 60% /  EF 30%... echo.... [place]... December 28, 2009 with CHFPLanned ICD / CRT... patient has seen Dr.Klein..EF improved October, 2011   . Cardiomyopathy, nonischemic     EF improved October, 2011  . Asymmetric septal hypertrophy     echo....04/2009....patient encouraged the family to be screened elsewhere  . LVH (left ventricular hypertrophy)     moderately severe... echo.. June, 2010  /  EF improved October, 2011.... cancel plans for ICD  . Systolic heart failure     chronic  . Hypertension   . Mitral regurgitation     mild/ moderate...echo June, 2010  /   echo.. October, 2011.... mitral regurgitation  improved  . Aortic insufficiency     mild...echo... June 2 010  . Renal artery stenosis     80% upper left   . Groin hematoma     Right-hematoma/abscess..repaired.. December, 2009  . Anemia     further workup needed... October,  2011.  [name] LBBB (left bundle branch block)   . OSA (obstructive sleep apnea)   . Arthritis     Hips/Knees, severe, requiring a walker  . Bradycardia   . Acute renal failure      hospitalized... December 08, 2009... improved with hydration in the hospital  . Atrial fibrillation      ??? atrial fibrillation during hospitalization October, 2011 ???..  . CAD (coronary artery disease)     Mild coronary disease, catheterization, 2009    Past Surgical History  Procedure Laterality Date  . Abdominal hysterectomy    . Surgical repair of a catheteriztion associated femoral artery injury      Patient Active Problem List   Diagnosis Date Noted  . Overweight(278.02) 07/29/2013  . Chronic combined systolic and diastolic CHF (congestive heart failure) 12/18/2012  . CAD (coronary artery disease)   . Ejection fraction   . Cardiomyopathy, nonischemic   . Asymmetric septal hypertrophy   . LVH (left ventricular hypertrophy)   . Hypertension   . Mitral regurgitation   . Renal artery stenosis   . Anemia   . LBBB (left bundle branch block)   . OSA (obstructive sleep apnea)   . Bradycardia   . Atrial fibrillation   . HYPOKALEMIA 02/14/2010  . Aortic regurgitation 11/21/2008      Current Outpatient Prescriptions  Medication Sig Dispense Refill  . acetaminophen (TYLENOL) 500 MG  tablet Take 500 mg by mouth at bedtime.    [name] amLODipine (NORVASC) 5 MG tablet TAKE 1 TABLET BY MOUTH DAILY 30 tablet 6  . aspirin 81 MG tablet Take 81 mg by mouth daily.      . Calcium Carbonate-Vitamin D (CALCIUM 600/VITAMIN D) 600-400 MG-UNIT per tablet Take 1 tablet by mouth 2 (two) times daily.      . carvedilol (COREG) 3.125 MG tablet TAKE 1 TABLET BY MOUTH TWICE DAILY WITH MEALS 60 tablet 6  . cloNIDine (CATAPRES) 0.2 MG tablet TAKE 1 TABLET BY MOUTH THREE TIMES DAILY 90 tablet 3  . Docusate Sodium 100 MG capsule Take 200 mg by mouth at bedtime.    . ferrous sulfate 325 (65 FE) MG tablet Take 325 mg by mouth daily.       . furosemide (LASIX) 80 MG tablet Take 1 tablet (80 mg total) by mouth 2 (two) times daily. 180 tablet 3  . hydrALAZINE (APRESOLINE) 50 MG tablet TAKE 1 TABLET BY MOUTH THREE TIMES DAILY 90 tablet 6  . isosorbide mononitrate (IMDUR) 60 MG 24 hr tablet TAKE 1 TABLET BY MOUTH DAILY 30 tablet 6  . Multiple Vitamin (MULTIVITAMIN) tablet Take 1 tablet by mouth daily.    . potassium chloride SA (K-DUR,KLOR-CON) 20 MEQ tablet TAKE 2 TABLETS BY MOUTH TWICE DAILY 120 tablet 0  . ramipril (ALTACE) 10 MG capsule TAKE ONE CAPSULE BY MOUTH DAILY 30 capsule 6   No current facility-administered medications for this visit.    Allergies:   Cephalexin    Social History:  The patient  reports that she quit smoking about 28 years ago. Her smoking use included Cigarettes. She started smoking about 55 years ago. She has a 24 pack-year smoking history. She has never used smokeless tobacco. She reports that she does not drink alcohol.   Family History:  There is no significant family history of coronary artery disease.   ROS:  Please see the history of present illness.  Patient denies fever, chills, headache, sweats, rash, change in vision, change in hearing, chest pain, cough, nausea or vomiting, urinary symptoms. All other systems are reviewed and are negative.    PHYSICAL EXAM: VS:  BP 144/81 mmHg  Pulse 54  Ht 5\' 5"  (1.651 m)  Wt 204 lb 12.8 oz (92.897 kg)  BMI 34.08 kg/m2  SpO2 95% , Patient is oriented to person time and place. Affect is normal. She is here with her rolling walker. She uses this because of a differential in her leg lengths after surgery. [name] helps to make sure that she is stable. She is able to walk without it. Head is atraumatic. Sclera and conjunctiva are normal. She has chronic swelling of her upper eyelids. If she becomes volume overloaded this becomes worse. However it is a chronic finding. There is no jugular venous distention. Lungs are clear. Respiratory effort is  nonlabored. Cardiac exam reveals an S1 and S2. The abdomen is soft. There is no significant peripheral edema. There are no skin rashes.  EKG:   EKG is done today and reviewed by me. There is sinus bradycardia with left bundle-branch block. There is no change from the past.   Recent Labs: No results found for requested labs within last 365 days.    Lipid Panel    Component Value Date/Time   CHOL  02/16/2008 1500    110        ATP III CLASSIFICATION:  <200     mg/dL  Desirable  200-239  mg/dL   Borderline High  >=240    mg/dL   High   TRIG 80 02/16/2008 1500   HDL 42 02/16/2008 1500   CHOLHDL 2.6 02/16/2008 1500   VLDL 16 02/16/2008 1500   LDLCALC  02/16/2008 1500    52        Total Cholesterol/HDL:CHD Risk Coronary Heart Disease Risk Table                     Men   Women  1/2 Average Risk   3.4   3.3      Wt Readings from Last 3 Encounters:  07/15/14 204 lb 12.8 oz (92.897 kg)  04/13/14 201 lb (91.173 kg)  01/17/14 185 lb (83.915 kg)      Current medicines are reviewed  The patient understands her medications.     ASSESSMENT AND PLAN:

## 2014-07-15 NOTE — Assessment & Plan Note (Addendum)
In 2009, her EF had been in the 25% range. By October, 2011 her EF had improved to at least 45%. It was checked again in 2012 and the EF was in the 40-45% range. The patient had an echo in November, 2015. The study was technically difficult. But the EF may have been in the 30-35% range. I have chosen to fine tune her medications. At this time I have chosen not to proceed with follow-up echo. This can be considered in the future. It is not clear why she appears to have changing ejection fractions over time. She does have a left bundle branch block. Possibly in the future she could be considered for CRT therapy. She has no symptoms at this time. Her medication combination is unusual, but this has worked for her. In the past, we were prepared to proceed with an ICD. It was at that time that repeat echo showed that her EF had improved.

## 2014-07-15 NOTE — Patient Instructions (Signed)
Your physician recommends that you continue on your current medications as directed. Please refer to the Current Medication list given to you today. Your physician recommends that you schedule a follow-up appointment in: 6 months. You will receive a reminder letter in the mail in about 4 months reminding you to call and schedule your appointment. If you don't receive this letter, please contact our office. 

## 2014-07-15 NOTE — Assessment & Plan Note (Signed)
Her left bundle branch block plays a role with the estimation of her ejection fraction.

## 2014-07-16 ENCOUNTER — Other Ambulatory Visit: Payer: Self-pay | Admitting: Cardiology

## 2014-08-02 ENCOUNTER — Other Ambulatory Visit: Payer: Self-pay | Admitting: Cardiology

## 2014-08-13 ENCOUNTER — Other Ambulatory Visit: Payer: Self-pay | Admitting: Cardiology

## 2014-09-01 ENCOUNTER — Other Ambulatory Visit: Payer: Self-pay | Admitting: Cardiology

## 2014-10-31 ENCOUNTER — Other Ambulatory Visit: Payer: Self-pay | Admitting: Cardiology

## 2014-11-11 ENCOUNTER — Other Ambulatory Visit: Payer: Self-pay | Admitting: Cardiology

## 2015-01-05 ENCOUNTER — Other Ambulatory Visit: Payer: Self-pay | Admitting: Cardiology

## 2015-02-11 ENCOUNTER — Other Ambulatory Visit: Payer: Self-pay | Admitting: Cardiology

## 2015-03-01 ENCOUNTER — Other Ambulatory Visit: Payer: Self-pay | Admitting: Cardiology

## 2015-03-08 ENCOUNTER — Ambulatory Visit (INDEPENDENT_AMBULATORY_CARE_PROVIDER_SITE_OTHER): Payer: Medicare Other | Admitting: Cardiology

## 2015-03-08 ENCOUNTER — Other Ambulatory Visit: Payer: Self-pay | Admitting: Cardiology

## 2015-03-08 ENCOUNTER — Encounter: Payer: Self-pay | Admitting: *Deleted

## 2015-03-08 ENCOUNTER — Encounter: Payer: Self-pay | Admitting: Cardiology

## 2015-03-08 VITALS — BP 157/84 | HR 61 | Ht 65.0 in | Wt 212.4 lb

## 2015-03-08 DIAGNOSIS — I10 Essential (primary) hypertension: Secondary | ICD-10-CM

## 2015-03-08 DIAGNOSIS — I5022 Chronic systolic (congestive) heart failure: Secondary | ICD-10-CM | POA: Diagnosis not present

## 2015-03-08 NOTE — Patient Instructions (Signed)
Your physician recommends that you schedule a follow-up appointment in: [place] DR. [place]  Your physician recommends that you continue on your current medications as directed. Please refer to the Current Medication list given to you today.  Your physician has requested that you have an echocardiogram. Echocardiography is a painless test that uses sound waves to create images of your heart. It provides your doctor with information about the size and shape of your heart and how well your heart's chambers and valves are working. This procedure takes approximately one hour. There are no restrictions for this procedure.  You have been referred to [place] physician has requested that you regularly monitor and record your blood pressure readings at home FOR 1 WEEK AND CALL RESULTS TO OUR OFFICE. Please use the same machine at the same time of day to check your readings and record them to bring to your follow-up visit.  Thank you for choosing [facility]!!

## 2015-03-08 NOTE — Progress Notes (Signed)
Patient ID: Gina [name], female   DOB: April 16, 1940, 75 y.o.   MRN: [id number]     Clinical Summary Gina [name] is a 75 y.o.female former patient of Dr Ron Parker, this is our first visit together. She is seen for the following medical problems.  1. Chronic systolic HF - 123XX123 echo LVEF 123XX123, grade I diastolic dysfunction. Multiple WMAs.  - echo 10/2010 LVEF 40-45%, multiple WMAs.  - cath 2009 patent coronaries - from records appears she had severe LV systolic dysfunction several years ago (around 2009) that improved, but most recently has decreased again.  - denies any SOB or DOE. Notes some weight gain (8 lbs by our scale), but denies any LE edema. Exertion limited by chronic knee and hip pain.  - limiting sodium intake. Avoiding NSAIDs - compliant with meds.   2. HTN - checks at home occasionally, typically 130s/70s.      Past Medical History  Diagnosis Date  . Ejection fraction     EF 25%...nondiagnostic MRI December 2009...  /  echo June, 2010  /   echo... December 08, 2009.... ejection fraction improved... EF 60% /  EF 30%... echo.... [place]... December 28, 2009 with CHFPLanned ICD / CRT... patient has seen Dr.Klein..EF improved October, 2011   . Cardiomyopathy, nonischemic     EF improved October, 2011  . Asymmetric septal hypertrophy     echo....04/2009....patient encouraged the family to be screened elsewhere  . LVH (left ventricular hypertrophy)     moderately severe... echo.. June, 2010  /  EF improved October, 2011.... cancel plans for ICD  . Systolic heart failure     chronic  . Hypertension   . Mitral regurgitation     mild/ moderate...echo June, 2010  /   echo.. October, 2011.... mitral regurgitation  improved  . Aortic insufficiency     mild...echo... June 2 010  . Renal artery stenosis     80% upper left   . Groin hematoma     Right-hematoma/abscess..repaired.. December, 2009  . Anemia     further workup needed... October, 2011.  [name] LBBB (left bundle  Jamielynn Wigley block)   . OSA (obstructive sleep apnea)   . Arthritis     Hips/Knees, severe, requiring a walker  . Bradycardia   . Acute renal failure      hospitalized... December 08, 2009... improved with hydration in the hospital  . Atrial fibrillation      ??? atrial fibrillation during hospitalization October, 2011 ???..  . CAD (coronary artery disease)     Mild coronary disease, catheterization, 2009     Allergies  Allergen Reactions  . Cephalexin     REACTION: Rash to arms/legs     Current Outpatient Prescriptions  Medication Sig Dispense Refill  . acetaminophen (TYLENOL) 500 MG tablet Take 500 mg by mouth at bedtime.    [name] amLODipine (NORVASC) 5 MG tablet TAKE 1 TABLET BY MOUTH DAILY 30 tablet 6  . aspirin 81 MG tablet Take 81 mg by mouth daily.      . Calcium Carbonate-Vitamin D (CALCIUM 600/VITAMIN D) 600-400 MG-UNIT per tablet Take 1 tablet by mouth 2 (two) times daily.      . carvedilol (COREG) 3.125 MG tablet TAKE 1 TABLET BY MOUTH TWICE DAILY WITH MEALS 60 tablet 6  . cloNIDine (CATAPRES) 0.2 MG tablet TAKE 1 TABLET BY MOUTH THREE TIMES DAILY 90 tablet 0  . Docusate Sodium 100 MG capsule Take 200 mg by mouth at bedtime.    . ferrous  sulfate 325 (65 FE) MG tablet Take 325 mg by mouth daily.      . furosemide (LASIX) 80 MG tablet Take 1 tablet (80 mg total) by mouth 2 (two) times daily. 180 tablet 3  . hydrALAZINE (APRESOLINE) 50 MG tablet TAKE 1 TABLET BY MOUTH THREE TIMES DAILY 90 tablet 0  . isosorbide mononitrate (IMDUR) 60 MG 24 hr tablet TAKE 1 TABLET BY MOUTH EVERY DAY 30 tablet 0  . Multiple Vitamin (MULTIVITAMIN) tablet Take 1 tablet by mouth daily.    . potassium chloride SA (K-DUR,KLOR-CON) 20 MEQ tablet TAKE 2 TABLETS BY MOUTH TWICE DAILY 120 tablet 3  . ramipril (ALTACE) 10 MG capsule TAKE 1 CAPSULE BY MOUTH EVERY DAY 30 capsule 6   No current facility-administered medications for this visit.     Past Surgical History  Procedure Laterality Date  . Abdominal  hysterectomy    . Surgical repair of a catheteriztion associated femoral artery injury       Allergies  Allergen Reactions  . Cephalexin     REACTION: Rash to arms/legs      No family history on file.   Social History Gina [name] reports that she quit smoking about 29 years ago. Her smoking use included Cigarettes. She started smoking about 56 years ago. She has a 24 pack-year smoking history. She has never used smokeless tobacco. Gina [name] reports that she does not drink alcohol.   Review of Systems CONSTITUTIONAL: No weight loss, fever, chills, weakness or fatigue.  HEENT: Eyes: No visual loss, blurred vision, double vision or yellow sclerae.No hearing loss, sneezing, congestion, runny nose or sore throat.  SKIN: No rash or itching.  CARDIOVASCULAR: per hpi RESPIRATORY: No shortness of breath, cough or sputum.  GASTROINTESTINAL: No anorexia, nausea, vomiting or diarrhea. No abdominal pain or blood.  GENITOURINARY: No burning on urination, no polyuria NEUROLOGICAL: No headache, dizziness, syncope, paralysis, ataxia, numbness or tingling in the extremities. No change in bowel or bladder control.  MUSCULOSKELETAL: No muscle, back pain, joint pain or stiffness.  LYMPHATICS: No enlarged nodes. No history of splenectomy.  PSYCHIATRIC: No history of depression or anxiety.  ENDOCRINOLOGIC: No reports of sweating, cold or heat intolerance. No polyuria or polydipsia.  [name]   Physical Examination Filed Vitals:   03/08/15 1419  BP: 157/84  Pulse: 61   Filed Vitals:   03/08/15 1419  Height: 5\' 5"  (1.651 m)  Weight: 212 lb 6.4 oz (96.344 kg)    Gen: resting comfortably, no acute distress HEENT: no scleral icterus, pupils equal round and reactive, no palptable cervical adenopathy,  CV: RRR, no m/r/g, no jvd Resp: Clear to auscultation bilaterally GI: abdomen is soft, non-tender, non-distended, normal bowel sounds, no hepatosplenomegaly MSK: extremities are warm, no edema.  Skin:  warm, no rash Neuro:  no focal deficits Psych: appropriate affect   Diagnostic Studies  02/2008 cath FINDINGS: Hemodynamics: Mean right atrial pressure was 13 mmHg. RV 43/9, PA 47/28 with mean PA pressure of 37 mmHg. Mean pulmonary capillary wedge pressure of 23 mmHg. Aorta 181/90. Left ventricle 177/17/26. Mixed venous O2 saturation was 65%, aortic saturation 92%, SVR was 1355, cardiac output was 6.55 liters per minute. Cardiac index was 3.45. Left ventriculography: Left ventricle was enlarged. There was global severe hypokinesis with an estimated ejection fraction of 25%. Coronary angiography: The right coronary artery was dominant and showed only luminal irregularities. Left main coronary artery was free from significant disease. The circumflex was a large vessel, there were 3 obtuse marginal vessels  with a 30% ostial stenosis of the first obtuse marginal. There was a 30% stenosis in the proximal circumflex between the first and second obtuse marginals. The LAD system actually was a dual LAD type system, with a large diagonal that reached the apex. There is a mild 20% stenosis, just prior to the bifurcation of the large first diagonal off the LAD. The remainder of the LAD did not have significant disease. There were luminal irregularities in the large first diagonal. This vessel reached the apex. Non-selective abdominal aortography did show 2 right and 2 left renal arteries, there appeared to be an 80% ostial stenosis of the upper left renal artery. This was confirmed by a selective renal angiography.   02/2014 echo Study Conclusions  - Left ventricle: The cavity size was normal. Wall thickness was increased in a pattern of moderate LVH. Sigmoid shaped basal septum. Systolic function was severely reduced. The estimated ejection fraction was in the range of 25% to 30%. Diffuse hypokinesis. There is severe hypokinesis of  the mid-apicalanteroseptal, anterior, and apical myocardium. Doppler parameters are consistent with abnormal left ventricular relaxation (grade 1 diastolic dysfunction). - Ventricular septum: Septal motion showed abnormal function and dyssynergy. - Aortic valve: Mildly calcified annulus. Trileaflet. There was trivial regurgitation. - Ascending aorta: The ascending aorta was mildly dilated, 43 mm. - Mitral valve: Calcified annulus. There was trivial regurgitation. - Left atrium: The atrium was severely dilated. - Right atrium: Central venous pressure (est): 3 mm Hg. - Atrial septum: No defect or patent foramen ovale was identified. - Tricuspid valve: There was trivial regurgitation. - Pulmonary arteries: Systolic pressure could not be accurately estimated. - Pericardium, extracardiac: There was no pericardial effusion.  Impressions:  - Limited images. Unable to compare with prior study. Moderate LVH with sigmoid shaped basal septum, LVEF approximately 25-30% with diffuse hypokinesis, most severe mid to apical anteroseptal and anterior wall. Septal dyssynegry. Grade 1 diastolic dysfunction. Severe left atrial enlargement. MIldly dilated ascending aorta. Trivial aortic regurgitation. Trivial tricuspid regurgitation, unable to assess PASP.    Assessment and Plan  1. Chronic systolic heart failure - we will repeat echo, pending results may need ICD consideration - continue current meds  2. HTN - elevated in clinic, home numbers at goal. Continue to monitor   F/u 1 month      Arnoldo Lenis, M.D.

## 2015-03-09 ENCOUNTER — Ambulatory Visit (INDEPENDENT_AMBULATORY_CARE_PROVIDER_SITE_OTHER): Payer: Medicare Other

## 2015-03-09 ENCOUNTER — Other Ambulatory Visit: Payer: Self-pay

## 2015-03-09 DIAGNOSIS — I5022 Chronic systolic (congestive) heart failure: Secondary | ICD-10-CM

## 2015-03-13 ENCOUNTER — Telehealth: Payer: Self-pay | Admitting: *Deleted

## 2015-03-13 NOTE — Telephone Encounter (Signed)
-----   Message from Arnoldo Lenis, MD sent at 03/13/2015  3:18 PM EST ----- Echo shows heart function remains weak, however stable from echo 1 year ago. We will discuss in detail at our next follow up, including possible referral for ICD consideration  Zandra Abts MD

## 2015-03-13 NOTE — Telephone Encounter (Signed)
Pt aware, confirmed 04/11/15 appt. Routed to pcp

## 2015-03-15 ENCOUNTER — Encounter: Payer: Self-pay | Admitting: *Deleted

## 2015-03-23 ENCOUNTER — Encounter: Payer: Self-pay | Admitting: *Deleted

## 2015-03-23 ENCOUNTER — Telehealth: Payer: Self-pay | Admitting: *Deleted

## 2015-03-23 DIAGNOSIS — I10 Essential (primary) hypertension: Secondary | ICD-10-CM

## 2015-03-23 NOTE — Telephone Encounter (Signed)
Lab from August requested and BMP orders placed and will mail to pt. Pt aware and will have labs done in [facility].

## 2015-03-23 NOTE — Telephone Encounter (Signed)
Blood pressure is too high, I'd like to increase her ramipril however we need recent blood work for her. Can we find out if her pcp has done any recent labs lately (within last 1-2 months). If not she will need some ordered, please let me know if any recent labs   J Daxen Lanum MD

## 2015-03-23 NOTE — Telephone Encounter (Signed)
Pt has not had lab work since August.

## 2015-03-23 NOTE — Addendum Note (Signed)
Addended by: Julian Hy T on: 03/23/2015 02:01 PM   Modules accepted: Orders

## 2015-03-23 NOTE — Telephone Encounter (Signed)
Can we get those results, and also repeat a BMET for her. I would need to see her kidney function and potassium before increasing her ramipril   Zandra Abts MD

## 2015-03-23 NOTE — Telephone Encounter (Signed)
F/u BP log per LOV, will forward to Dr. Harl Bowie 03/14/15 - 10AM 151/76    HR  57 03/15/15 - 10AM  150/74          56 03/16/15   10AM  150/70          59 03/17/15   10AM  162/73          55 03/18/15   10AM  152/74          57 03/19/15   10AM   151/77         59 03/20/15   10AM   161/74         59

## 2015-03-29 DIAGNOSIS — I10 Essential (primary) hypertension: Secondary | ICD-10-CM | POA: Diagnosis not present

## 2015-03-30 ENCOUNTER — Telehealth: Payer: Self-pay | Admitting: *Deleted

## 2015-03-30 ENCOUNTER — Other Ambulatory Visit: Payer: Self-pay | Admitting: Cardiology

## 2015-03-30 NOTE — Telephone Encounter (Signed)
-----   Message from Arnoldo Lenis, MD sent at 03/30/2015 11:27 AM EST ----- Labs show potassium is low. Verify she is on potassium 20 mEq bid, if so have her take 79m in the AM and 20 at night x 3 days then resume her 20 mEq bid  Gina Abts MD

## 2015-03-30 NOTE — Telephone Encounter (Signed)
Pt is take 40 mEq of potassium bid. Will forward to Dr. Harl Bowie

## 2015-04-04 NOTE — Telephone Encounter (Signed)
If she has been taking potassium 40 bid, I would have her take 60 in the AM and 40 in the pm for the next 3 days then resume her 40 bid   Zandra Abts MD

## 2015-04-05 NOTE — Telephone Encounter (Signed)
Pt voices understanding increasing potassium X 3 days, confirmed f/u appt for next Tuesday

## 2015-04-11 ENCOUNTER — Ambulatory Visit (INDEPENDENT_AMBULATORY_CARE_PROVIDER_SITE_OTHER): Payer: Medicare Other | Admitting: Cardiology

## 2015-04-11 ENCOUNTER — Encounter: Payer: Self-pay | Admitting: Cardiology

## 2015-04-11 VITALS — BP 160/84 | HR 56 | Ht 65.0 in | Wt 210.4 lb

## 2015-04-11 DIAGNOSIS — I10 Essential (primary) hypertension: Secondary | ICD-10-CM

## 2015-04-11 DIAGNOSIS — I5022 Chronic systolic (congestive) heart failure: Secondary | ICD-10-CM | POA: Diagnosis not present

## 2015-04-11 MED ORDER — CLONIDINE HCL 0.2 MG PO TABS: 0.2000 mg | ORAL_TABLET | Freq: Two times a day (BID) | ORAL | Status: DC

## 2015-04-11 MED ORDER — RAMIPRIL 10 MG PO CAPS: 20.0000 mg | ORAL_CAPSULE | Freq: Every day | ORAL | Status: DC

## 2015-04-11 NOTE — Patient Instructions (Signed)
Your physician recommends that you schedule a follow-up appointment in: 3-4 WEEKS WITH DR. [place]  Your physician has recommended you make the following change in your medication:   RAMIPRIL 20 MG DAILY  CLONIDINE 0.2 MG TWICE DAILY  Your physician recommends that you return for lab work in: [place] has requested that you regularly monitor and record your blood pressure readings at home FOR 2 [place]. Please use the same machine at the same time of day to check your readings and record them to bring to your follow-up visit.  Thank you for choosing [facility]!!

## 2015-04-11 NOTE — Progress Notes (Signed)
Patient ID: Gina [name], female   DOB: 01-05-1941, 75 y.o.   MRN: [id number]     Clinical Summary Ms. Holen is a 75 y.o.female seen today for follow up of the following medical problems.   1. Chronic systolic HF - 123XX123 echo LVEF 123XX123, grade I diastolic dysfunction. Multiple WMAs.  - echo 10/2010 LVEF 40-45%, multiple WMAs.  - cath 2009 patent coronaries - echo 03/2015 LVEF 20-25% - from records appears she had severe LV systolic dysfunction several years ago (around 2009) that improved, but most recently has decreased again.  - limiting sodium intake. Avoiding NSAIDs - compliant with meds. Weight down 212 to 210 lbs. Doing cardiac rehab in [place] - she has asked to defer on ICD. - no recent SOB or DOE, no LE edema or orthopnea  2. HTN - checks at home occasionally, typically 160s/70s.  - compliant with meds  3. OSA - compliant with CPAP   Past Medical History  Diagnosis Date  . Ejection fraction     EF 25%...nondiagnostic MRI December 2009...  /  echo June, 2010  /   echo... December 08, 2009.... ejection fraction improved... EF 60% /  EF 30%... echo.... [facility]... December 28, 2009 with CHFPLanned ICD / CRT... patient has seen Dr.Klein..EF improved October, 2011   . Cardiomyopathy, nonischemic ([place])     EF improved October, 2011  . Asymmetric septal hypertrophy ([facility])     echo....04/2009....patient encouraged the family to be screened elsewhere  . LVH (left ventricular hypertrophy)     moderately severe... echo.. June, 2010  /  EF improved October, 2011.... cancel plans for ICD  . Systolic heart failure     chronic  . Hypertension   . Mitral regurgitation     mild/ moderate...echo June, 2010  /   echo.. October, 2011.... mitral regurgitation  improved  . Aortic insufficiency     mild...echo... June 2 010  . Renal artery stenosis ([facility])     80% upper left   . Groin hematoma     Right-hematoma/abscess..repaired.. December, 2009  . Anemia     further  workup needed... October, 2011.  [name] LBBB (left bundle Raine Blodgett block)   . OSA (obstructive sleep apnea)   . Arthritis     Hips/Knees, severe, requiring a walker  . Bradycardia   . Acute renal failure [facility])      hospitalized... December 08, 2009... improved with hydration in the hospital  . Atrial fibrillation ([place])      ??? atrial fibrillation during hospitalization October, 2011 ???..  . CAD (coronary artery disease)     Mild coronary disease, catheterization, 2009     Allergies  Allergen Reactions  . Cephalexin     REACTION: Rash to arms/legs     Current Outpatient Prescriptions  Medication Sig Dispense Refill  . acetaminophen (TYLENOL) 500 MG tablet Take 500 mg by mouth at bedtime.    [name] amLODipine (NORVASC) 5 MG tablet TAKE 1 TABLET BY MOUTH DAILY 30 tablet 6  . aspirin 81 MG tablet Take 81 mg by mouth daily.      . Calcium Carbonate-Vitamin D (CALCIUM 600/VITAMIN D) 600-400 MG-UNIT per tablet Take 1 tablet by mouth 2 (two) times daily.      . carvedilol (COREG) 3.125 MG tablet TAKE 1 TABLET BY MOUTH TWICE DAILY WITH MEALS 60 tablet 6  . cloNIDine (CATAPRES) 0.2 MG tablet TAKE 1 TABLET BY MOUTH THREE TIMES DAILY 90 tablet 3  . Docusate Sodium 100 MG capsule Take  200 mg by mouth at bedtime.    . ferrous sulfate 325 (65 FE) MG tablet Take 325 mg by mouth daily.      . furosemide (LASIX) 80 MG tablet Take 1 tablet (80 mg total) by mouth 2 (two) times daily. 180 tablet 3  . hydrALAZINE (APRESOLINE) 50 MG tablet TAKE 1 TABLET BY MOUTH THREE TIMES DAILY 90 tablet 3  . isosorbide mononitrate (IMDUR) 60 MG 24 hr tablet TAKE 1 TABLET BY MOUTH EVERY DAY 30 tablet 3  . Multiple Vitamin (MULTIVITAMIN) tablet Take 1 tablet by mouth daily.    . potassium chloride SA (K-DUR,KLOR-CON) 20 MEQ tablet TAKE 2 TABLETS BY MOUTH TWICE DAILY 120 tablet 6  . ramipril (ALTACE) 10 MG capsule TAKE 1 CAPSULE BY MOUTH EVERY DAY 30 capsule 6   No current facility-administered medications for this visit.      Past Surgical History  Procedure Laterality Date  . Abdominal hysterectomy    . Surgical repair of a catheteriztion associated femoral artery injury       Allergies  Allergen Reactions  . Cephalexin     REACTION: Rash to arms/legs      Family History  Problem Relation Age of Onset  . Rheumatic fever Mother     in her early 1's  . Alzheimer's disease Mother   . Stomach cancer Father 31    died 39  . Bradycardia Brother     has pacemaker  . Heart failure Paternal Grandmother   . Heart failure Paternal Grandfather      Social History Ms. Rivkin reports that she quit smoking about 29 years ago. Her smoking use included Cigarettes. She started smoking about 56 years ago. She has a 24 pack-year smoking history. She has never used smokeless tobacco. Ms. Hathway reports that she does not drink alcohol.   Review of Systems CONSTITUTIONAL: No weight loss, fever, chills, weakness or fatigue.  HEENT: Eyes: No visual loss, blurred vision, double vision or yellow sclerae.No hearing loss, sneezing, congestion, runny nose or sore throat.  SKIN: No rash or itching.  CARDIOVASCULAR: per hpi RESPIRATORY: No shortness of breath, cough or sputum.  GASTROINTESTINAL: No anorexia, nausea, vomiting or diarrhea. No abdominal pain or blood.  GENITOURINARY: No burning on urination, no polyuria NEUROLOGICAL: No headache, dizziness, syncope, paralysis, ataxia, numbness or tingling in the extremities. No change in bowel or bladder control.  MUSCULOSKELETAL: No muscle, back pain, joint pain or stiffness.  LYMPHATICS: No enlarged nodes. No history of splenectomy.  PSYCHIATRIC: No history of depression or anxiety.  ENDOCRINOLOGIC: No reports of sweating, cold or heat intolerance. No polyuria or polydipsia.  [name]   Physical Examination Filed Vitals:   04/11/15 1329 04/11/15 1332  BP: 152/82 160/84  Pulse: 56 56   Filed Vitals:   04/11/15 1329  Height: 5\' 5"  (1.651 m)  Weight: 210 lb 6.4  oz (95.437 kg)    Gen: resting comfortably, no acute distress HEENT: no scleral icterus, pupils equal round and reactive, no palptable cervical adenopathy,  CV: RRR, no m/r/g, no jvd Resp: Clear to auscultation bilaterally GI: abdomen is soft, non-tender, non-distended, normal bowel sounds, no hepatosplenomegaly MSK: extremities are warm, no edema.  Skin: warm, no rash Neuro:  no focal deficits Psych: appropriate affect   Diagnostic Studies 02/2008 cath FINDINGS: Hemodynamics: Mean right atrial pressure was 13 mmHg. RV 43/9, PA 47/28 with mean PA pressure of 37 mmHg. Mean pulmonary capillary wedge pressure of 23 mmHg. Aorta 181/90. Left ventricle 177/17/26. Mixed venous  O2 saturation was 65%, aortic saturation 92%, SVR was 1355, cardiac output was 6.55 liters per minute. Cardiac index was 3.45. Left ventriculography: Left ventricle was enlarged. There was global severe hypokinesis with an estimated ejection fraction of 25%. Coronary angiography: The right coronary artery was dominant and showed only luminal irregularities. Left main coronary artery was free from significant disease. The circumflex was a large vessel, there were 3 obtuse marginal vessels with a 30% ostial stenosis of the first obtuse marginal. There was a 30% stenosis in the proximal circumflex between the first and second obtuse marginals. The LAD system actually was a dual LAD type system, with a large diagonal that reached the apex. There is a mild 20% stenosis, just prior to the bifurcation of the large first diagonal off the LAD. The remainder of the LAD did not have significant disease. There were luminal irregularities in the large first diagonal. This vessel reached the apex. Non-selective abdominal aortography did show 2 right and 2 left renal arteries, there appeared to be an 80% ostial stenosis of the upper left renal artery. This was confirmed by a selective  renal angiography.   02/2014 echo Study Conclusions  - Left ventricle: The cavity size was normal. Wall thickness was increased in a pattern of moderate LVH. Sigmoid shaped basal septum. Systolic function was severely reduced. The estimated ejection fraction was in the range of 25% to 30%. Diffuse hypokinesis. There is severe hypokinesis of the mid-apicalanteroseptal, anterior, and apical myocardium. Doppler parameters are consistent with abnormal left ventricular relaxation (grade 1 diastolic dysfunction). - Ventricular septum: Septal motion showed abnormal function and dyssynergy. - Aortic valve: Mildly calcified annulus. Trileaflet. There was trivial regurgitation. - Ascending aorta: The ascending aorta was mildly dilated, 43 mm. - Mitral valve: Calcified annulus. There was trivial regurgitation. - Left atrium: The atrium was severely dilated. - Right atrium: Central venous pressure (est): 3 mm Hg. - Atrial septum: No defect or patent foramen ovale was identified. - Tricuspid valve: There was trivial regurgitation. - Pulmonary arteries: Systolic pressure could not be accurately estimated. - Pericardium, extracardiac: There was no pericardial effusion.  Impressions:  - Limited images. Unable to compare with prior study. Moderate LVH with sigmoid shaped basal septum, LVEF approximately 25-30% with diffuse hypokinesis, most severe mid to apical anteroseptal and anterior wall. Septal dyssynegry. Grade 1 diastolic dysfunction. Severe left atrial enlargement. MIldly dilated ascending aorta. Trivial aortic regurgitation. Trivial tricuspid regurgitation, unable to assess PASP.   Jan 2017 echo Study Conclusions  - Left ventricle: The cavity size was normal. Wall thickness was increased in a pattern of severe LVH. Systolic function was severely reduced. The estimated ejection fraction was in the range of 20% to 25%. Diffuse hypokinesis.  Doppler parameters are consistent with abnormal left ventricular relaxation (grade 1 diastolic dysfunction). - Aortic valve: Mildly calcified annulus. Mildly thickened leaflets. Valve area (VTI): 2.43 cm^2. Valve area (Vmax): 2.39 cm^2. Valve area (Vmean): 2.28 cm^2. - Mitral valve: Mildly calcified annulus. Mildly thickened leaflets . There was mild regurgitation. - Left atrium: The atrium was moderately dilated. - Technically adequate study.   Assessment and Plan  1. Chronic systolic heart failure - appears euvolemic, weights are stable -we will increase rampril to 20mg  daily, repeat BMET in 2 weeks. Low heart rates, will not increase beta blocker at this time.  -She is not in favor of ICD at this time, if she reconsiders could be a candidate for CRT.   2. HTN - elevated in clinic, follow bp with  ramipril increase as described above - decrease clonidine to 0.2mg  bid, will work to wean so as to allow room for CHF beneficial meds   F/u 1 month      Arnoldo Lenis, M.D.

## 2015-04-12 DIAGNOSIS — M1990 Unspecified osteoarthritis, unspecified site: Secondary | ICD-10-CM | POA: Diagnosis not present

## 2015-04-12 DIAGNOSIS — I10 Essential (primary) hypertension: Secondary | ICD-10-CM | POA: Diagnosis not present

## 2015-04-12 DIAGNOSIS — I509 Heart failure, unspecified: Secondary | ICD-10-CM | POA: Diagnosis not present

## 2015-04-18 DIAGNOSIS — Z7982 Long term (current) use of aspirin: Secondary | ICD-10-CM | POA: Diagnosis not present

## 2015-04-18 DIAGNOSIS — I5022 Chronic systolic (congestive) heart failure: Secondary | ICD-10-CM | POA: Diagnosis not present

## 2015-04-18 DIAGNOSIS — I2510 Atherosclerotic heart disease of native coronary artery without angina pectoris: Secondary | ICD-10-CM | POA: Diagnosis not present

## 2015-04-18 DIAGNOSIS — I340 Nonrheumatic mitral (valve) insufficiency: Secondary | ICD-10-CM | POA: Diagnosis not present

## 2015-04-18 DIAGNOSIS — Z79899 Other long term (current) drug therapy: Secondary | ICD-10-CM | POA: Diagnosis not present

## 2015-04-18 DIAGNOSIS — Z96653 Presence of artificial knee joint, bilateral: Secondary | ICD-10-CM | POA: Diagnosis not present

## 2015-04-18 DIAGNOSIS — Z96642 Presence of left artificial hip joint: Secondary | ICD-10-CM | POA: Diagnosis not present

## 2015-04-18 DIAGNOSIS — I110 Hypertensive heart disease with heart failure: Secondary | ICD-10-CM | POA: Diagnosis not present

## 2015-04-18 DIAGNOSIS — Z87891 Personal history of nicotine dependence: Secondary | ICD-10-CM | POA: Diagnosis not present

## 2015-04-18 DIAGNOSIS — Z881 Allergy status to other antibiotic agents status: Secondary | ICD-10-CM | POA: Diagnosis not present

## 2015-04-18 DIAGNOSIS — R0600 Dyspnea, unspecified: Secondary | ICD-10-CM | POA: Diagnosis not present

## 2015-04-18 DIAGNOSIS — Z5189 Encounter for other specified aftercare: Secondary | ICD-10-CM | POA: Diagnosis not present

## 2015-04-18 DIAGNOSIS — R5383 Other fatigue: Secondary | ICD-10-CM | POA: Diagnosis not present

## 2015-04-18 DIAGNOSIS — Z90710 Acquired absence of both cervix and uterus: Secondary | ICD-10-CM | POA: Diagnosis not present

## 2015-04-24 DIAGNOSIS — I340 Nonrheumatic mitral (valve) insufficiency: Secondary | ICD-10-CM | POA: Diagnosis not present

## 2015-04-24 DIAGNOSIS — I5022 Chronic systolic (congestive) heart failure: Secondary | ICD-10-CM | POA: Diagnosis not present

## 2015-04-24 DIAGNOSIS — I110 Hypertensive heart disease with heart failure: Secondary | ICD-10-CM | POA: Diagnosis not present

## 2015-04-24 DIAGNOSIS — Z96642 Presence of left artificial hip joint: Secondary | ICD-10-CM | POA: Diagnosis not present

## 2015-04-24 DIAGNOSIS — Z5189 Encounter for other specified aftercare: Secondary | ICD-10-CM | POA: Diagnosis not present

## 2015-04-24 DIAGNOSIS — I2510 Atherosclerotic heart disease of native coronary artery without angina pectoris: Secondary | ICD-10-CM | POA: Diagnosis not present

## 2015-04-26 DIAGNOSIS — I5022 Chronic systolic (congestive) heart failure: Secondary | ICD-10-CM | POA: Diagnosis not present

## 2015-04-26 DIAGNOSIS — Z96642 Presence of left artificial hip joint: Secondary | ICD-10-CM | POA: Diagnosis not present

## 2015-04-26 DIAGNOSIS — I2510 Atherosclerotic heart disease of native coronary artery without angina pectoris: Secondary | ICD-10-CM | POA: Diagnosis not present

## 2015-04-26 DIAGNOSIS — Z5189 Encounter for other specified aftercare: Secondary | ICD-10-CM | POA: Diagnosis not present

## 2015-04-26 DIAGNOSIS — I110 Hypertensive heart disease with heart failure: Secondary | ICD-10-CM | POA: Diagnosis not present

## 2015-04-26 DIAGNOSIS — I340 Nonrheumatic mitral (valve) insufficiency: Secondary | ICD-10-CM | POA: Diagnosis not present

## 2015-04-28 DIAGNOSIS — I5022 Chronic systolic (congestive) heart failure: Secondary | ICD-10-CM | POA: Diagnosis not present

## 2015-04-28 DIAGNOSIS — I2510 Atherosclerotic heart disease of native coronary artery without angina pectoris: Secondary | ICD-10-CM | POA: Diagnosis not present

## 2015-04-28 DIAGNOSIS — Z5189 Encounter for other specified aftercare: Secondary | ICD-10-CM | POA: Diagnosis not present

## 2015-04-28 DIAGNOSIS — Z96642 Presence of left artificial hip joint: Secondary | ICD-10-CM | POA: Diagnosis not present

## 2015-04-28 DIAGNOSIS — I340 Nonrheumatic mitral (valve) insufficiency: Secondary | ICD-10-CM | POA: Diagnosis not present

## 2015-04-28 DIAGNOSIS — I110 Hypertensive heart disease with heart failure: Secondary | ICD-10-CM | POA: Diagnosis not present

## 2015-05-01 DIAGNOSIS — I5022 Chronic systolic (congestive) heart failure: Secondary | ICD-10-CM | POA: Diagnosis not present

## 2015-05-01 DIAGNOSIS — I2510 Atherosclerotic heart disease of native coronary artery without angina pectoris: Secondary | ICD-10-CM | POA: Diagnosis not present

## 2015-05-01 DIAGNOSIS — I110 Hypertensive heart disease with heart failure: Secondary | ICD-10-CM | POA: Diagnosis not present

## 2015-05-01 DIAGNOSIS — Z96642 Presence of left artificial hip joint: Secondary | ICD-10-CM | POA: Diagnosis not present

## 2015-05-01 DIAGNOSIS — Z5189 Encounter for other specified aftercare: Secondary | ICD-10-CM | POA: Diagnosis not present

## 2015-05-01 DIAGNOSIS — I340 Nonrheumatic mitral (valve) insufficiency: Secondary | ICD-10-CM | POA: Diagnosis not present

## 2015-05-03 DIAGNOSIS — Z881 Allergy status to other antibiotic agents status: Secondary | ICD-10-CM | POA: Diagnosis not present

## 2015-05-03 DIAGNOSIS — Z87891 Personal history of nicotine dependence: Secondary | ICD-10-CM | POA: Diagnosis not present

## 2015-05-03 DIAGNOSIS — I5022 Chronic systolic (congestive) heart failure: Secondary | ICD-10-CM | POA: Diagnosis not present

## 2015-05-03 DIAGNOSIS — Z96653 Presence of artificial knee joint, bilateral: Secondary | ICD-10-CM | POA: Diagnosis not present

## 2015-05-03 DIAGNOSIS — Z7982 Long term (current) use of aspirin: Secondary | ICD-10-CM | POA: Diagnosis not present

## 2015-05-03 DIAGNOSIS — I340 Nonrheumatic mitral (valve) insufficiency: Secondary | ICD-10-CM | POA: Diagnosis not present

## 2015-05-03 DIAGNOSIS — I110 Hypertensive heart disease with heart failure: Secondary | ICD-10-CM | POA: Diagnosis not present

## 2015-05-03 DIAGNOSIS — I2510 Atherosclerotic heart disease of native coronary artery without angina pectoris: Secondary | ICD-10-CM | POA: Diagnosis not present

## 2015-05-03 DIAGNOSIS — R5383 Other fatigue: Secondary | ICD-10-CM | POA: Diagnosis not present

## 2015-05-03 DIAGNOSIS — R0600 Dyspnea, unspecified: Secondary | ICD-10-CM | POA: Diagnosis not present

## 2015-05-03 DIAGNOSIS — Z5189 Encounter for other specified aftercare: Secondary | ICD-10-CM | POA: Diagnosis not present

## 2015-05-03 DIAGNOSIS — Z96642 Presence of left artificial hip joint: Secondary | ICD-10-CM | POA: Diagnosis not present

## 2015-05-03 DIAGNOSIS — Z79899 Other long term (current) drug therapy: Secondary | ICD-10-CM | POA: Diagnosis not present

## 2015-05-03 DIAGNOSIS — Z90710 Acquired absence of both cervix and uterus: Secondary | ICD-10-CM | POA: Diagnosis not present

## 2015-05-05 ENCOUNTER — Encounter: Payer: Self-pay | Admitting: Cardiology

## 2015-05-05 ENCOUNTER — Encounter: Payer: Self-pay | Admitting: *Deleted

## 2015-05-05 ENCOUNTER — Ambulatory Visit (INDEPENDENT_AMBULATORY_CARE_PROVIDER_SITE_OTHER): Payer: Medicare Other | Admitting: Cardiology

## 2015-05-05 VITALS — BP 159/74 | HR 62 | Ht 65.0 in | Wt 211.0 lb

## 2015-05-05 DIAGNOSIS — I10 Essential (primary) hypertension: Secondary | ICD-10-CM | POA: Diagnosis not present

## 2015-05-05 DIAGNOSIS — I340 Nonrheumatic mitral (valve) insufficiency: Secondary | ICD-10-CM | POA: Diagnosis not present

## 2015-05-05 DIAGNOSIS — I110 Hypertensive heart disease with heart failure: Secondary | ICD-10-CM | POA: Diagnosis not present

## 2015-05-05 DIAGNOSIS — I5022 Chronic systolic (congestive) heart failure: Secondary | ICD-10-CM

## 2015-05-05 DIAGNOSIS — I2510 Atherosclerotic heart disease of native coronary artery without angina pectoris: Secondary | ICD-10-CM | POA: Diagnosis not present

## 2015-05-05 DIAGNOSIS — Z5189 Encounter for other specified aftercare: Secondary | ICD-10-CM | POA: Diagnosis not present

## 2015-05-05 DIAGNOSIS — Z96642 Presence of left artificial hip joint: Secondary | ICD-10-CM | POA: Diagnosis not present

## 2015-05-05 MED ORDER — CLONIDINE HCL 0.2 MG PO TABS: ORAL_TABLET | ORAL | Status: DC

## 2015-05-05 NOTE — Progress Notes (Signed)
Patient ID: Tenesia Biernacki, female   DOB: 08/10/1940, 75 y.o.   MRN: [id number]     Clinical Summary Ms. Verdusco is a 75 y.o.female seen today for follow up of the following medical problems.   1. Chronic systolic HF - 123XX123 echo LVEF 123XX123, grade I diastolic dysfunction. Multiple WMAs.  - echo 10/2010 LVEF 40-45%, multiple WMAs.  - cath 2009 patent coronaries - echo 03/2015 LVEF 20-25% - from records appears she had severe LV systolic dysfunction several years ago (around 2009) that improved, but most recently has decreased again.  - limiting sodium intake. Avoiding NSAIDs - compliant with meds. Weight down 212 to 210 lbs. Doing cardiac rehab in [place] - she has asked to defer on ICD. - no recent SOB or DOE, no LE edema or orthopnea   2. HTN - checks at home occasionally, typically 160s/70s.  - compliant with meds - we have been weaning clonidine   3. OSA - compliant with CPAP   Past Medical History  Diagnosis Date  . Ejection fraction     EF 25%...nondiagnostic MRI December 2009...  /  echo June, 2010  /   echo... December 08, 2009.... ejection fraction improved... EF 60% /  EF 30%... echo.... [place]... December 28, 2009 with CHFPLanned ICD / CRT... patient has seen Dr.Klein..EF improved October, 2011   . Cardiomyopathy, nonischemic ([place])     EF improved October, 2011  . Asymmetric septal hypertrophy (HCC)     echo....04/2009....patient encouraged the family to be screened elsewhere  . LVH (left ventricular hypertrophy)     moderately severe... echo.. June, 2010  /  EF improved October, 2011.... cancel plans for ICD  . Systolic heart failure     chronic  . Hypertension   . Mitral regurgitation     mild/ moderate...echo June, 2010  /   echo.. October, 2011.... mitral regurgitation  improved  . Aortic insufficiency     mild...echo... June 2 010  . Renal artery stenosis (HCC)     80% upper left   . Groin hematoma     Right-hematoma/abscess..repaired..  December, 2009  . Anemia     further workup needed... October, 2011.  [name] LBBB (left bundle Nilo Fallin block)   . OSA (obstructive sleep apnea)   . Arthritis     Hips/Knees, severe, requiring a walker  . Bradycardia   . Acute renal failure [facility])      hospitalized... December 08, 2009... improved with hydration in the hospital  . Atrial fibrillation ([place])      ??? atrial fibrillation during hospitalization October, 2011 ???..  . CAD (coronary artery disease)     Mild coronary disease, catheterization, 2009     Allergies  Allergen Reactions  . Cephalexin     REACTION: Rash to arms/legs     Current Outpatient Prescriptions  Medication Sig Dispense Refill  . acetaminophen (TYLENOL) 500 MG tablet Take 500 mg by mouth at bedtime.    [name] amLODipine (NORVASC) 5 MG tablet TAKE 1 TABLET BY MOUTH DAILY 30 tablet 6  . aspirin 81 MG tablet Take 81 mg by mouth daily.      . Calcium Carbonate-Vitamin D (CALCIUM 600/VITAMIN D) 600-400 MG-UNIT per tablet Take 1 tablet by mouth 2 (two) times daily.      . carvedilol (COREG) 3.125 MG tablet TAKE 1 TABLET BY MOUTH TWICE DAILY WITH MEALS 60 tablet 6  . cloNIDine (CATAPRES) 0.2 MG tablet Take 1 tablet (0.2 mg total) by mouth 2 (two) times  daily. 60 tablet 3  . Docusate Sodium 100 MG capsule Take 200 mg by mouth at bedtime.    . ferrous sulfate 325 (65 FE) MG tablet Take 325 mg by mouth daily.      . furosemide (LASIX) 80 MG tablet Take 1 tablet (80 mg total) by mouth 2 (two) times daily. 180 tablet 3  . hydrALAZINE (APRESOLINE) 50 MG tablet TAKE 1 TABLET BY MOUTH THREE TIMES DAILY 90 tablet 3  . isosorbide mononitrate (IMDUR) 60 MG 24 hr tablet TAKE 1 TABLET BY MOUTH EVERY DAY 30 tablet 3  . Multiple Vitamin (MULTIVITAMIN) tablet Take 1 tablet by mouth daily.    . Multiple Vitamins-Minerals (ICAPS PO) Take 1 capsule by mouth daily.    . potassium chloride SA (K-DUR,KLOR-CON) 20 MEQ tablet TAKE 2 TABLETS BY MOUTH TWICE DAILY 120 tablet 6  . ramipril (ALTACE)  10 MG capsule Take 2 capsules (20 mg total) by mouth daily. 60 capsule 3   No current facility-administered medications for this visit.     Past Surgical History  Procedure Laterality Date  . Abdominal hysterectomy    . Surgical repair of a catheteriztion associated femoral artery injury       Allergies  Allergen Reactions  . Cephalexin     REACTION: Rash to arms/legs      Family History  Problem Relation Age of Onset  . Rheumatic fever Mother     in her early 75's  . Alzheimer's disease Mother   . Stomach cancer Father 69    died 55  . Bradycardia Brother     has pacemaker  . Heart failure Paternal Grandmother   . Heart failure Paternal Grandfather      Social History Ms. Durrer reports that she quit smoking about 29 years ago. Her smoking use included Cigarettes. She started smoking about 56 years ago. She has a 24 pack-year smoking history. She has never used smokeless tobacco. Ms. Gozman reports that she does not drink alcohol.   Review of Systems CONSTITUTIONAL: No weight loss, fever, chills, weakness or fatigue.  HEENT: Eyes: No visual loss, blurred vision, double vision or yellow sclerae.No hearing loss, sneezing, congestion, runny nose or sore throat.  SKIN: No rash or itching.  CARDIOVASCULAR: per HPI RESPIRATORY: No shortness of breath, cough or sputum.  GASTROINTESTINAL: No anorexia, nausea, vomiting or diarrhea. No abdominal pain or blood.  GENITOURINARY: No burning on urination, no polyuria NEUROLOGICAL: No headache, dizziness, syncope, paralysis, ataxia, numbness or tingling in the extremities. No change in bowel or bladder control.  MUSCULOSKELETAL: No muscle, back pain, joint pain or stiffness.  LYMPHATICS: No enlarged nodes. No history of splenectomy.  PSYCHIATRIC: No history of depression or anxiety.  ENDOCRINOLOGIC: No reports of sweating, cold or heat intolerance. No polyuria or polydipsia.  [name]   Physical Examination Filed Vitals:    05/05/15 1503  BP: 159/74  Pulse: 62   Filed Vitals:   05/05/15 1503  Height: 5\' 5"  (1.651 m)  Weight: 211 lb (95.709 kg)    Gen: resting comfortably, no acute distress HEENT: no scleral icterus, pupils equal round and reactive, no palptable cervical adenopathy,  CV: RRR, no m/r/g, no jvd Resp: Clear to auscultation bilaterally GI: abdomen is soft, non-tender, non-distended, normal bowel sounds, no hepatosplenomegaly MSK: extremities are warm, no edema.  Skin: warm, no rash Neuro:  no focal deficits Psych: appropriate affect   Diagnostic Studies 02/2008 cath FINDINGS: Hemodynamics: Mean right atrial pressure was 13 mmHg. RV 43/9, PA 47/28  with mean PA pressure of 37 mmHg. Mean pulmonary capillary wedge pressure of 23 mmHg. Aorta 181/90. Left ventricle 177/17/26. Mixed venous O2 saturation was 65%, aortic saturation 92%, SVR was 1355, cardiac output was 6.55 liters per minute. Cardiac index was 3.45. Left ventriculography: Left ventricle was enlarged. There was global severe hypokinesis with an estimated ejection fraction of 25%. Coronary angiography: The right coronary artery was dominant and showed only luminal irregularities. Left main coronary artery was free from significant disease. The circumflex was a large vessel, there were 3 obtuse marginal vessels with a 30% ostial stenosis of the first obtuse marginal. There was a 30% stenosis in the proximal circumflex between the first and second obtuse marginals. The LAD system actually was a dual LAD type system, with a large diagonal that reached the apex. There is a mild 20% stenosis, just prior to the bifurcation of the large first diagonal off the LAD. The remainder of the LAD did not have significant disease. There were luminal irregularities in the large first diagonal. This vessel reached the apex. Non-selective abdominal aortography did show 2 right and 2 left renal arteries,  there appeared to be an 80% ostial stenosis of the upper left renal artery. This was confirmed by a selective renal angiography.   02/2014 echo Study Conclusions  - Left ventricle: The cavity size was normal. Wall thickness was increased in a pattern of moderate LVH. Sigmoid shaped basal septum. Systolic function was severely reduced. The estimated ejection fraction was in the range of 25% to 30%. Diffuse hypokinesis. There is severe hypokinesis of the mid-apicalanteroseptal, anterior, and apical myocardium. Doppler parameters are consistent with abnormal left ventricular relaxation (grade 1 diastolic dysfunction). - Ventricular septum: Septal motion showed abnormal function and dyssynergy. - Aortic valve: Mildly calcified annulus. Trileaflet. There was trivial regurgitation. - Ascending aorta: The ascending aorta was mildly dilated, 43 mm. - Mitral valve: Calcified annulus. There was trivial regurgitation. - Left atrium: The atrium was severely dilated. - Right atrium: Central venous pressure (est): 3 mm Hg. - Atrial septum: No defect or patent foramen ovale was identified. - Tricuspid valve: There was trivial regurgitation. - Pulmonary arteries: Systolic pressure could not be accurately estimated. - Pericardium, extracardiac: There was no pericardial effusion.  Impressions:  - Limited images. Unable to compare with prior study. Moderate LVH with sigmoid shaped basal septum, LVEF approximately 25-30% with diffuse hypokinesis, most severe mid to apical anteroseptal and anterior wall. Septal dyssynegry. Grade 1 diastolic dysfunction. Severe left atrial enlargement. MIldly dilated ascending aorta. Trivial aortic regurgitation. Trivial tricuspid regurgitation, unable to assess PASP.  Jan 2017 echo Study Conclusions  - Left ventricle: The cavity size was normal. Wall thickness was increased in a pattern of severe LVH. Systolic function  was severely reduced. The estimated ejection fraction was in the range of 20% to 25%. Diffuse hypokinesis. Doppler parameters are consistent with abnormal left ventricular relaxation (grade 1 diastolic dysfunction). - Aortic valve: Mildly calcified annulus. Mildly thickened leaflets. Valve area (VTI): 2.43 cm^2. Valve area (Vmax): 2.39 cm^2. Valve area (Vmean): 2.28 cm^2. - Mitral valve: Mildly calcified annulus. Mildly thickened leaflets . There was mild regurgitation. - Left atrium: The atrium was moderately dilated. - Technically adequate study.    Assessment and Plan   1. Chronic systolic heart failure - no current symptoms - we will start aldactone 25mg  daily with repeat BMET in 2 weeks - we discussed repeating her cath due to her drop in LVEF, she asks to hold off at this time  2. HTN - wean and stop clonidine over the next 7 days - follow bp after starting aldactone    F/u 1 month     Arnoldo Lenis, M.D.

## 2015-05-05 NOTE — Patient Instructions (Signed)
   Your physician is weaning you off off the Clonidine.  Take 0.1mg  twice a day x 3 days, then 0.1mg  daily x 3 days, then STOP. Continue all other medications.   Office will call with further instructions after MD reviews labs from [place].  Follow up in 3-4 weeks.

## 2015-05-08 DIAGNOSIS — Z96642 Presence of left artificial hip joint: Secondary | ICD-10-CM | POA: Diagnosis not present

## 2015-05-08 DIAGNOSIS — I5022 Chronic systolic (congestive) heart failure: Secondary | ICD-10-CM | POA: Diagnosis not present

## 2015-05-08 DIAGNOSIS — Z5189 Encounter for other specified aftercare: Secondary | ICD-10-CM | POA: Diagnosis not present

## 2015-05-08 DIAGNOSIS — I110 Hypertensive heart disease with heart failure: Secondary | ICD-10-CM | POA: Diagnosis not present

## 2015-05-08 DIAGNOSIS — I340 Nonrheumatic mitral (valve) insufficiency: Secondary | ICD-10-CM | POA: Diagnosis not present

## 2015-05-08 DIAGNOSIS — I2510 Atherosclerotic heart disease of native coronary artery without angina pectoris: Secondary | ICD-10-CM | POA: Diagnosis not present

## 2015-05-10 DIAGNOSIS — I2510 Atherosclerotic heart disease of native coronary artery without angina pectoris: Secondary | ICD-10-CM | POA: Diagnosis not present

## 2015-05-10 DIAGNOSIS — I110 Hypertensive heart disease with heart failure: Secondary | ICD-10-CM | POA: Diagnosis not present

## 2015-05-10 DIAGNOSIS — Z96642 Presence of left artificial hip joint: Secondary | ICD-10-CM | POA: Diagnosis not present

## 2015-05-10 DIAGNOSIS — Z5189 Encounter for other specified aftercare: Secondary | ICD-10-CM | POA: Diagnosis not present

## 2015-05-10 DIAGNOSIS — I340 Nonrheumatic mitral (valve) insufficiency: Secondary | ICD-10-CM | POA: Diagnosis not present

## 2015-05-10 DIAGNOSIS — I5022 Chronic systolic (congestive) heart failure: Secondary | ICD-10-CM | POA: Diagnosis not present

## 2015-05-11 ENCOUNTER — Telehealth: Payer: Self-pay | Admitting: *Deleted

## 2015-05-11 DIAGNOSIS — I5042 Chronic combined systolic (congestive) and diastolic (congestive) heart failure: Secondary | ICD-10-CM

## 2015-05-11 DIAGNOSIS — E876 Hypokalemia: Secondary | ICD-10-CM

## 2015-05-11 DIAGNOSIS — I10 Essential (primary) hypertension: Secondary | ICD-10-CM

## 2015-05-11 MED ORDER — POTASSIUM CHLORIDE CRYS ER 20 MEQ PO TBCR: 20.0000 meq | EXTENDED_RELEASE_TABLET | Freq: Two times a day (BID) | ORAL | Status: DC

## 2015-05-11 MED ORDER — SPIRONOLACTONE 25 MG PO TABS: 25.0000 mg | ORAL_TABLET | Freq: Every day | ORAL | Status: DC

## 2015-05-11 NOTE — Telephone Encounter (Signed)
Patient notified.  New medication sent to [facility] today.  Will mail order for labs at patient request as she will do them in [place].

## 2015-05-11 NOTE — Telephone Encounter (Signed)
-----   Message from Arnoldo Lenis, MD sent at 05/05/2015  4:43 PM EST ----- Please let patient know we got her labs from [place]. They overall look good, we want to start her on aldactone 25mg  daily and change her KCl to 44mEq bid. She will need repeat BMET and Mg in 2 weeks.   Zandra Abts MD

## 2015-05-12 ENCOUNTER — Other Ambulatory Visit: Payer: Self-pay | Admitting: Cardiology

## 2015-05-12 DIAGNOSIS — Z5189 Encounter for other specified aftercare: Secondary | ICD-10-CM | POA: Diagnosis not present

## 2015-05-12 DIAGNOSIS — Z96642 Presence of left artificial hip joint: Secondary | ICD-10-CM | POA: Diagnosis not present

## 2015-05-12 DIAGNOSIS — I340 Nonrheumatic mitral (valve) insufficiency: Secondary | ICD-10-CM | POA: Diagnosis not present

## 2015-05-12 DIAGNOSIS — I2510 Atherosclerotic heart disease of native coronary artery without angina pectoris: Secondary | ICD-10-CM | POA: Diagnosis not present

## 2015-05-12 DIAGNOSIS — I110 Hypertensive heart disease with heart failure: Secondary | ICD-10-CM | POA: Diagnosis not present

## 2015-05-12 DIAGNOSIS — I5022 Chronic systolic (congestive) heart failure: Secondary | ICD-10-CM | POA: Diagnosis not present

## 2015-05-15 DIAGNOSIS — I340 Nonrheumatic mitral (valve) insufficiency: Secondary | ICD-10-CM | POA: Diagnosis not present

## 2015-05-15 DIAGNOSIS — I5022 Chronic systolic (congestive) heart failure: Secondary | ICD-10-CM | POA: Diagnosis not present

## 2015-05-15 DIAGNOSIS — I2510 Atherosclerotic heart disease of native coronary artery without angina pectoris: Secondary | ICD-10-CM | POA: Diagnosis not present

## 2015-05-15 DIAGNOSIS — I110 Hypertensive heart disease with heart failure: Secondary | ICD-10-CM | POA: Diagnosis not present

## 2015-05-15 DIAGNOSIS — Z96642 Presence of left artificial hip joint: Secondary | ICD-10-CM | POA: Diagnosis not present

## 2015-05-15 DIAGNOSIS — Z5189 Encounter for other specified aftercare: Secondary | ICD-10-CM | POA: Diagnosis not present

## 2015-05-17 DIAGNOSIS — Z96642 Presence of left artificial hip joint: Secondary | ICD-10-CM | POA: Diagnosis not present

## 2015-05-17 DIAGNOSIS — I110 Hypertensive heart disease with heart failure: Secondary | ICD-10-CM | POA: Diagnosis not present

## 2015-05-17 DIAGNOSIS — I5022 Chronic systolic (congestive) heart failure: Secondary | ICD-10-CM | POA: Diagnosis not present

## 2015-05-17 DIAGNOSIS — I340 Nonrheumatic mitral (valve) insufficiency: Secondary | ICD-10-CM | POA: Diagnosis not present

## 2015-05-17 DIAGNOSIS — I2510 Atherosclerotic heart disease of native coronary artery without angina pectoris: Secondary | ICD-10-CM | POA: Diagnosis not present

## 2015-05-17 DIAGNOSIS — Z5189 Encounter for other specified aftercare: Secondary | ICD-10-CM | POA: Diagnosis not present

## 2015-05-19 DIAGNOSIS — I110 Hypertensive heart disease with heart failure: Secondary | ICD-10-CM | POA: Diagnosis not present

## 2015-05-19 DIAGNOSIS — I2510 Atherosclerotic heart disease of native coronary artery without angina pectoris: Secondary | ICD-10-CM | POA: Diagnosis not present

## 2015-05-19 DIAGNOSIS — Z96642 Presence of left artificial hip joint: Secondary | ICD-10-CM | POA: Diagnosis not present

## 2015-05-19 DIAGNOSIS — Z5189 Encounter for other specified aftercare: Secondary | ICD-10-CM | POA: Diagnosis not present

## 2015-05-19 DIAGNOSIS — I340 Nonrheumatic mitral (valve) insufficiency: Secondary | ICD-10-CM | POA: Diagnosis not present

## 2015-05-19 DIAGNOSIS — I5022 Chronic systolic (congestive) heart failure: Secondary | ICD-10-CM | POA: Diagnosis not present

## 2015-05-22 DIAGNOSIS — I5022 Chronic systolic (congestive) heart failure: Secondary | ICD-10-CM | POA: Diagnosis not present

## 2015-05-22 DIAGNOSIS — I110 Hypertensive heart disease with heart failure: Secondary | ICD-10-CM | POA: Diagnosis not present

## 2015-05-22 DIAGNOSIS — Z96642 Presence of left artificial hip joint: Secondary | ICD-10-CM | POA: Diagnosis not present

## 2015-05-22 DIAGNOSIS — I2510 Atherosclerotic heart disease of native coronary artery without angina pectoris: Secondary | ICD-10-CM | POA: Diagnosis not present

## 2015-05-22 DIAGNOSIS — Z5189 Encounter for other specified aftercare: Secondary | ICD-10-CM | POA: Diagnosis not present

## 2015-05-22 DIAGNOSIS — I340 Nonrheumatic mitral (valve) insufficiency: Secondary | ICD-10-CM | POA: Diagnosis not present

## 2015-05-24 DIAGNOSIS — I340 Nonrheumatic mitral (valve) insufficiency: Secondary | ICD-10-CM | POA: Diagnosis not present

## 2015-05-24 DIAGNOSIS — Z96642 Presence of left artificial hip joint: Secondary | ICD-10-CM | POA: Diagnosis not present

## 2015-05-24 DIAGNOSIS — Z5189 Encounter for other specified aftercare: Secondary | ICD-10-CM | POA: Diagnosis not present

## 2015-05-24 DIAGNOSIS — I2510 Atherosclerotic heart disease of native coronary artery without angina pectoris: Secondary | ICD-10-CM | POA: Diagnosis not present

## 2015-05-24 DIAGNOSIS — I5022 Chronic systolic (congestive) heart failure: Secondary | ICD-10-CM | POA: Diagnosis not present

## 2015-05-24 DIAGNOSIS — I110 Hypertensive heart disease with heart failure: Secondary | ICD-10-CM | POA: Diagnosis not present

## 2015-05-26 DIAGNOSIS — I110 Hypertensive heart disease with heart failure: Secondary | ICD-10-CM | POA: Diagnosis not present

## 2015-05-26 DIAGNOSIS — Z5189 Encounter for other specified aftercare: Secondary | ICD-10-CM | POA: Diagnosis not present

## 2015-05-26 DIAGNOSIS — I340 Nonrheumatic mitral (valve) insufficiency: Secondary | ICD-10-CM | POA: Diagnosis not present

## 2015-05-26 DIAGNOSIS — I2510 Atherosclerotic heart disease of native coronary artery without angina pectoris: Secondary | ICD-10-CM | POA: Diagnosis not present

## 2015-05-26 DIAGNOSIS — I5022 Chronic systolic (congestive) heart failure: Secondary | ICD-10-CM | POA: Diagnosis not present

## 2015-05-26 DIAGNOSIS — Z96642 Presence of left artificial hip joint: Secondary | ICD-10-CM | POA: Diagnosis not present

## 2015-05-29 DIAGNOSIS — Z5189 Encounter for other specified aftercare: Secondary | ICD-10-CM | POA: Diagnosis not present

## 2015-05-29 DIAGNOSIS — I340 Nonrheumatic mitral (valve) insufficiency: Secondary | ICD-10-CM | POA: Diagnosis not present

## 2015-05-29 DIAGNOSIS — I110 Hypertensive heart disease with heart failure: Secondary | ICD-10-CM | POA: Diagnosis not present

## 2015-05-29 DIAGNOSIS — I2510 Atherosclerotic heart disease of native coronary artery without angina pectoris: Secondary | ICD-10-CM | POA: Diagnosis not present

## 2015-05-29 DIAGNOSIS — Z96642 Presence of left artificial hip joint: Secondary | ICD-10-CM | POA: Diagnosis not present

## 2015-05-29 DIAGNOSIS — I5022 Chronic systolic (congestive) heart failure: Secondary | ICD-10-CM | POA: Diagnosis not present

## 2015-05-31 DIAGNOSIS — I110 Hypertensive heart disease with heart failure: Secondary | ICD-10-CM | POA: Diagnosis not present

## 2015-05-31 DIAGNOSIS — I5022 Chronic systolic (congestive) heart failure: Secondary | ICD-10-CM | POA: Diagnosis not present

## 2015-05-31 DIAGNOSIS — I340 Nonrheumatic mitral (valve) insufficiency: Secondary | ICD-10-CM | POA: Diagnosis not present

## 2015-05-31 DIAGNOSIS — Z96642 Presence of left artificial hip joint: Secondary | ICD-10-CM | POA: Diagnosis not present

## 2015-05-31 DIAGNOSIS — I2510 Atherosclerotic heart disease of native coronary artery without angina pectoris: Secondary | ICD-10-CM | POA: Diagnosis not present

## 2015-05-31 DIAGNOSIS — Z5189 Encounter for other specified aftercare: Secondary | ICD-10-CM | POA: Diagnosis not present

## 2015-06-01 ENCOUNTER — Encounter: Payer: Self-pay | Admitting: *Deleted

## 2015-06-02 DIAGNOSIS — I110 Hypertensive heart disease with heart failure: Secondary | ICD-10-CM | POA: Diagnosis not present

## 2015-06-02 DIAGNOSIS — Z5189 Encounter for other specified aftercare: Secondary | ICD-10-CM | POA: Diagnosis not present

## 2015-06-02 DIAGNOSIS — I340 Nonrheumatic mitral (valve) insufficiency: Secondary | ICD-10-CM | POA: Diagnosis not present

## 2015-06-02 DIAGNOSIS — Z96642 Presence of left artificial hip joint: Secondary | ICD-10-CM | POA: Diagnosis not present

## 2015-06-02 DIAGNOSIS — I2510 Atherosclerotic heart disease of native coronary artery without angina pectoris: Secondary | ICD-10-CM | POA: Diagnosis not present

## 2015-06-02 DIAGNOSIS — I5022 Chronic systolic (congestive) heart failure: Secondary | ICD-10-CM | POA: Diagnosis not present

## 2015-06-05 DIAGNOSIS — I340 Nonrheumatic mitral (valve) insufficiency: Secondary | ICD-10-CM | POA: Diagnosis not present

## 2015-06-05 DIAGNOSIS — Z96642 Presence of left artificial hip joint: Secondary | ICD-10-CM | POA: Diagnosis not present

## 2015-06-05 DIAGNOSIS — Z5189 Encounter for other specified aftercare: Secondary | ICD-10-CM | POA: Diagnosis not present

## 2015-06-05 DIAGNOSIS — Z87891 Personal history of nicotine dependence: Secondary | ICD-10-CM | POA: Diagnosis not present

## 2015-06-05 DIAGNOSIS — Z79899 Other long term (current) drug therapy: Secondary | ICD-10-CM | POA: Diagnosis not present

## 2015-06-05 DIAGNOSIS — Z7982 Long term (current) use of aspirin: Secondary | ICD-10-CM | POA: Diagnosis not present

## 2015-06-05 DIAGNOSIS — R5383 Other fatigue: Secondary | ICD-10-CM | POA: Diagnosis not present

## 2015-06-05 DIAGNOSIS — Z90710 Acquired absence of both cervix and uterus: Secondary | ICD-10-CM | POA: Diagnosis not present

## 2015-06-05 DIAGNOSIS — Z881 Allergy status to other antibiotic agents status: Secondary | ICD-10-CM | POA: Diagnosis not present

## 2015-06-05 DIAGNOSIS — I2510 Atherosclerotic heart disease of native coronary artery without angina pectoris: Secondary | ICD-10-CM | POA: Diagnosis not present

## 2015-06-05 DIAGNOSIS — Z96653 Presence of artificial knee joint, bilateral: Secondary | ICD-10-CM | POA: Diagnosis not present

## 2015-06-05 DIAGNOSIS — I110 Hypertensive heart disease with heart failure: Secondary | ICD-10-CM | POA: Diagnosis not present

## 2015-06-05 DIAGNOSIS — R0600 Dyspnea, unspecified: Secondary | ICD-10-CM | POA: Diagnosis not present

## 2015-06-05 DIAGNOSIS — I5022 Chronic systolic (congestive) heart failure: Secondary | ICD-10-CM | POA: Diagnosis not present

## 2015-06-06 ENCOUNTER — Encounter: Payer: Self-pay | Admitting: Cardiology

## 2015-06-06 ENCOUNTER — Ambulatory Visit (INDEPENDENT_AMBULATORY_CARE_PROVIDER_SITE_OTHER): Payer: Medicare Other | Admitting: Cardiology

## 2015-06-06 VITALS — BP 133/74 | HR 69 | Ht 65.0 in | Wt 211.0 lb

## 2015-06-06 DIAGNOSIS — I5022 Chronic systolic (congestive) heart failure: Secondary | ICD-10-CM

## 2015-06-06 DIAGNOSIS — I10 Essential (primary) hypertension: Secondary | ICD-10-CM | POA: Diagnosis not present

## 2015-06-06 DIAGNOSIS — G4733 Obstructive sleep apnea (adult) (pediatric): Secondary | ICD-10-CM | POA: Diagnosis not present

## 2015-06-06 MED ORDER — CARVEDILOL 6.25 MG PO TABS: 6.2500 mg | ORAL_TABLET | Freq: Two times a day (BID) | ORAL | Status: DC

## 2015-06-06 NOTE — Patient Instructions (Addendum)
   Increase Coreg to 6.25mg  twice a day  - new sent to [facility]  today.  Continue all other medications.   Follow up in  1 month

## 2015-06-06 NOTE — Progress Notes (Signed)
Patient ID: Gina [name], female   DOB: 1940-11-12, 75 y.o.   MRN: [id number]     Clinical Summary Gina [name] is a 75 y.o.female seen today for follow up of the following medical problems  1. Chronic systolic HF - 123XX123 echo LVEF 123XX123, grade I diastolic dysfunction. Multiple WMAs.  - echo 10/2010 LVEF 40-45%, multiple WMAs.  - cath 2009 patent coronaries - echo 03/2015 LVEF 20-25% - from records appears she had severe LV systolic dysfunction several years ago (around 2009) that improved, but most recently has decreased again.  - last visit we started aldactone 25mg  daily. Repeat labs were stable. - Weights at home stable around 203-205. No LE edema recently.  - doing cardiac rehab in [place].    2. HTN - compliant with meds - last visit we weaned her clonidine, she is now off this medication.    3. OSA - compliant with CPAP Past Medical History  Diagnosis Date  . Ejection fraction     EF 25%...nondiagnostic MRI December 2009...  /  echo June, 2010  /   echo... December 08, 2009.... ejection fraction improved... EF 60% /  EF 30%... echo.... [place]... December 28, 2009 with CHFPLanned ICD / CRT... patient has seen Dr.Klein..EF improved October, 2011   . Cardiomyopathy, nonischemic ([place])     EF improved October, 2011  . Asymmetric septal hypertrophy (HCC)     echo....04/2009....patient encouraged the family to be screened elsewhere  . LVH (left ventricular hypertrophy)     moderately severe... echo.. June, 2010  /  EF improved October, 2011.... cancel plans for ICD  . Systolic heart failure     chronic  . Hypertension   . Mitral regurgitation     mild/ moderate...echo June, 2010  /   echo.. October, 2011.... mitral regurgitation  improved  . Aortic insufficiency     mild...echo... June 2 010  . Renal artery stenosis (HCC)     80% upper left   . Groin hematoma     Right-hematoma/abscess..repaired.. December, 2009  . Anemia     further workup needed...  October, 2011.  Gina [name] LBBB (left bundle Alexsus Papadopoulos block)   . OSA (obstructive sleep apnea)   . Arthritis     Hips/Knees, severe, requiring a walker  . Bradycardia   . Acute renal failure [facility])      hospitalized... December 08, 2009... improved with hydration in the hospital  . Atrial fibrillation ([place])      ??? atrial fibrillation during hospitalization October, 2011 ???..  . CAD (coronary artery disease)     Mild coronary disease, catheterization, 2009     Allergies  Allergen Reactions  . Cephalexin     REACTION: Rash to arms/legs     Current Outpatient Prescriptions  Medication Sig Dispense Refill  . acetaminophen (TYLENOL) 500 MG tablet Take 500 mg by mouth at bedtime.    Gina [name] amLODipine (NORVASC) 5 MG tablet TAKE 1 TABLET BY MOUTH DAILY 30 tablet 6  . aspirin 81 MG tablet Take 81 mg by mouth daily.      . Calcium Carbonate-Vitamin D (CALCIUM 600/VITAMIN D) 600-400 MG-UNIT per tablet Take 1 tablet by mouth 2 (two) times daily.      . carvedilol (COREG) 3.125 MG tablet TAKE 1 TABLET BY MOUTH TWICE DAILY WITH MEALS 60 tablet 6  . cloNIDine (CATAPRES) 0.2 MG tablet Take 0.1mg  (1/2 tab) by mouth twice a day x 3 days, then 0.1mg  (1/2 tab) daily x 3 days, then STOP    .  Docusate Sodium 100 MG capsule Take 200 mg by mouth at bedtime.    . ferrous sulfate 325 (65 FE) MG tablet Take 325 mg by mouth daily.      . furosemide (LASIX) 80 MG tablet TAKE 1 TABLET BY MOUTH TWICE DAILY 180 tablet 3  . hydrALAZINE (APRESOLINE) 50 MG tablet TAKE 1 TABLET BY MOUTH THREE TIMES DAILY 90 tablet 3  . isosorbide mononitrate (IMDUR) 60 MG 24 hr tablet TAKE 1 TABLET BY MOUTH EVERY DAY 30 tablet 3  . Multiple Vitamin (MULTIVITAMIN) tablet Take 1 tablet by mouth daily.    . Multiple Vitamins-Minerals (ICAPS PO) Take 1 capsule by mouth daily.    . potassium chloride SA (K-DUR,KLOR-CON) 20 MEQ tablet Take 1 tablet (20 mEq total) by mouth 2 (two) times daily.    . ramipril (ALTACE) 10 MG capsule Take 2 capsules (20 mg  total) by mouth daily. 60 capsule 3  . spironolactone (ALDACTONE) 25 MG tablet Take 1 tablet (25 mg total) by mouth daily. 30 tablet 6   No current facility-administered medications for this visit.     Past Surgical History  Procedure Laterality Date  . Abdominal hysterectomy    . Surgical repair of a catheteriztion associated femoral artery injury       Allergies  Allergen Reactions  . Cephalexin     REACTION: Rash to arms/legs      Family History  Problem Relation Age of Onset  . Rheumatic fever Mother     in her early 34's  . Alzheimer's disease Mother   . Stomach cancer Father 41    died 40  . Bradycardia Brother     has pacemaker  . Heart failure Paternal Grandmother   . Heart failure Paternal Grandfather      Social History Gina [name] reports that she quit smoking about 29 years ago. Her smoking use included Cigarettes. She started smoking about 56 years ago. She has a 24 pack-year smoking history. She has never used smokeless tobacco. Gina [name] reports that she does not drink alcohol.   Review of Systems CONSTITUTIONAL: No weight loss, fever, chills, weakness or fatigue.  HEENT: Eyes: No visual loss, blurred vision, double vision or yellow sclerae.No hearing loss, sneezing, congestion, runny nose or sore throat.  SKIN: No rash or itching.  CARDIOVASCULAR: per HPI RESPIRATORY: No shortness of breath, cough or sputum.  GASTROINTESTINAL: No anorexia, nausea, vomiting or diarrhea. No abdominal pain or blood.  GENITOURINARY: No burning on urination, no polyuria NEUROLOGICAL: No headache, dizziness, syncope, paralysis, ataxia, numbness or tingling in the extremities. No change in bowel or bladder control.  MUSCULOSKELETAL: No muscle, back pain, joint pain or stiffness.  LYMPHATICS: No enlarged nodes. No history of splenectomy.  PSYCHIATRIC: No history of depression or anxiety.  ENDOCRINOLOGIC: No reports of sweating, cold or heat intolerance. No polyuria or  polydipsia.  Gina [name]   Physical Examination Filed Vitals:   06/06/15 1356  BP: 133/74  Pulse: 69   Filed Vitals:   06/06/15 1356  Height: 5\' 5"  (1.651 m)  Weight: 211 lb (95.709 kg)    Gen: resting comfortably, no acute distress HEENT: no scleral icterus, pupils equal round and reactive, no palptable cervical adenopathy,  CV: RRR, no m/r/g, no jvd Resp: Clear to auscultation bilaterally GI: abdomen is soft, non-tender, non-distended, normal bowel sounds, no hepatosplenomegaly MSK: extremities are warm, no edema.  Skin: warm, no rash Neuro:  no focal deficits Psych: appropriate affect   Diagnostic Studies 02/2008 cath FINDINGS:  Hemodynamics: Mean right atrial pressure was 13 mmHg. RV 43/9, PA 47/28 with mean PA pressure of 37 mmHg. Mean pulmonary capillary wedge pressure of 23 mmHg. Aorta 181/90. Left ventricle 177/17/26. Mixed venous O2 saturation was 65%, aortic saturation 92%, SVR was 1355, cardiac output was 6.55 liters per minute. Cardiac index was 3.45. Left ventriculography: Left ventricle was enlarged. There was global severe hypokinesis with an estimated ejection fraction of 25%. Coronary angiography: The right coronary artery was dominant and showed only luminal irregularities. Left main coronary artery was free from significant disease. The circumflex was a large vessel, there were 3 obtuse marginal vessels with a 30% ostial stenosis of the first obtuse marginal. There was a 30% stenosis in the proximal circumflex between the first and second obtuse marginals. The LAD system actually was a dual LAD type system, with a large diagonal that reached the apex. There is a mild 20% stenosis, just prior to the bifurcation of the large first diagonal off the LAD. The remainder of the LAD did not have significant disease. There were luminal irregularities in the large first diagonal. This vessel reached the apex. Non-selective  abdominal aortography did show 2 right and 2 left renal arteries, there appeared to be an 80% ostial stenosis of the upper left renal artery. This was confirmed by a selective renal angiography.   02/2014 echo Study Conclusions  - Left ventricle: The cavity size was normal. Wall thickness was increased in a pattern of moderate LVH. Sigmoid shaped basal septum. Systolic function was severely reduced. The estimated ejection fraction was in the range of 25% to 30%. Diffuse hypokinesis. There is severe hypokinesis of the mid-apicalanteroseptal, anterior, and apical myocardium. Doppler parameters are consistent with abnormal left ventricular relaxation (grade 1 diastolic dysfunction). - Ventricular septum: Septal motion showed abnormal function and dyssynergy. - Aortic valve: Mildly calcified annulus. Trileaflet. There was trivial regurgitation. - Ascending aorta: The ascending aorta was mildly dilated, 43 mm. - Mitral valve: Calcified annulus. There was trivial regurgitation. - Left atrium: The atrium was severely dilated. - Right atrium: Central venous pressure (est): 3 mm Hg. - Atrial septum: No defect or patent foramen ovale was identified. - Tricuspid valve: There was trivial regurgitation. - Pulmonary arteries: Systolic pressure could not be accurately estimated. - Pericardium, extracardiac: There was no pericardial effusion.  Impressions:  - Limited images. Unable to compare with prior study. Moderate LVH with sigmoid shaped basal septum, LVEF approximately 25-30% with diffuse hypokinesis, most severe mid to apical anteroseptal and anterior wall. Septal dyssynegry. Grade 1 diastolic dysfunction. Severe left atrial enlargement. MIldly dilated ascending aorta. Trivial aortic regurgitation. Trivial tricuspid regurgitation, unable to assess PASP.  Jan 2017 echo Study Conclusions  - Left ventricle: The cavity size was normal. Wall thickness  was increased in a pattern of severe LVH. Systolic function was severely reduced. The estimated ejection fraction was in the range of 20% to 25%. Diffuse hypokinesis. Doppler parameters are consistent with abnormal left ventricular relaxation (grade 1 diastolic dysfunction). - Aortic valve: Mildly calcified annulus. Mildly thickened leaflets. Valve area (VTI): 2.43 cm^2. Valve area (Vmax): 2.39 cm^2. Valve area (Vmean): 2.28 cm^2. - Mitral valve: Mildly calcified annulus. Mildly thickened leaflets . There was mild regurgitation. - Left atrium: The atrium was moderately dilated. - Technically adequate study.     Assessment and Plan   1. Chronic systolic heart failure - no current symptoms - we discussed repeating her cath due to her drop in LVEF, she asks to hold off at this time.  We will optizmize medical therapy and then repeat echo, reconsider cath at that time based on LVEF - increase coreg to 6.25mg  bid.   2. HTN - at goal,continue to monitor   3. OSA - continue CPAP  F/u 1 month     Arnoldo Lenis, M.D.

## 2015-06-07 ENCOUNTER — Telehealth: Payer: Self-pay | Admitting: *Deleted

## 2015-06-07 DIAGNOSIS — Z96642 Presence of left artificial hip joint: Secondary | ICD-10-CM | POA: Diagnosis not present

## 2015-06-07 DIAGNOSIS — I340 Nonrheumatic mitral (valve) insufficiency: Secondary | ICD-10-CM | POA: Diagnosis not present

## 2015-06-07 DIAGNOSIS — I2510 Atherosclerotic heart disease of native coronary artery without angina pectoris: Secondary | ICD-10-CM | POA: Diagnosis not present

## 2015-06-07 DIAGNOSIS — I5022 Chronic systolic (congestive) heart failure: Secondary | ICD-10-CM | POA: Diagnosis not present

## 2015-06-07 DIAGNOSIS — I110 Hypertensive heart disease with heart failure: Secondary | ICD-10-CM | POA: Diagnosis not present

## 2015-06-07 DIAGNOSIS — Z5189 Encounter for other specified aftercare: Secondary | ICD-10-CM | POA: Diagnosis not present

## 2015-06-07 NOTE — Telephone Encounter (Signed)
Pt aware, routed to pcp 

## 2015-06-07 NOTE — Telephone Encounter (Signed)
-----   Message from Arnoldo Lenis, MD sent at 06/02/2015  3:48 PM EDT ----- Labs look good   Zandra Abts MD

## 2015-06-09 DIAGNOSIS — Z5189 Encounter for other specified aftercare: Secondary | ICD-10-CM | POA: Diagnosis not present

## 2015-06-09 DIAGNOSIS — I2510 Atherosclerotic heart disease of native coronary artery without angina pectoris: Secondary | ICD-10-CM | POA: Diagnosis not present

## 2015-06-09 DIAGNOSIS — Z96642 Presence of left artificial hip joint: Secondary | ICD-10-CM | POA: Diagnosis not present

## 2015-06-09 DIAGNOSIS — I110 Hypertensive heart disease with heart failure: Secondary | ICD-10-CM | POA: Diagnosis not present

## 2015-06-09 DIAGNOSIS — I5022 Chronic systolic (congestive) heart failure: Secondary | ICD-10-CM | POA: Diagnosis not present

## 2015-06-09 DIAGNOSIS — I340 Nonrheumatic mitral (valve) insufficiency: Secondary | ICD-10-CM | POA: Diagnosis not present

## 2015-06-12 DIAGNOSIS — Z5189 Encounter for other specified aftercare: Secondary | ICD-10-CM | POA: Diagnosis not present

## 2015-06-12 DIAGNOSIS — I2510 Atherosclerotic heart disease of native coronary artery without angina pectoris: Secondary | ICD-10-CM | POA: Diagnosis not present

## 2015-06-12 DIAGNOSIS — I340 Nonrheumatic mitral (valve) insufficiency: Secondary | ICD-10-CM | POA: Diagnosis not present

## 2015-06-12 DIAGNOSIS — I5022 Chronic systolic (congestive) heart failure: Secondary | ICD-10-CM | POA: Diagnosis not present

## 2015-06-12 DIAGNOSIS — Z96642 Presence of left artificial hip joint: Secondary | ICD-10-CM | POA: Diagnosis not present

## 2015-06-12 DIAGNOSIS — I110 Hypertensive heart disease with heart failure: Secondary | ICD-10-CM | POA: Diagnosis not present

## 2015-06-14 DIAGNOSIS — I340 Nonrheumatic mitral (valve) insufficiency: Secondary | ICD-10-CM | POA: Diagnosis not present

## 2015-06-14 DIAGNOSIS — Z5189 Encounter for other specified aftercare: Secondary | ICD-10-CM | POA: Diagnosis not present

## 2015-06-14 DIAGNOSIS — I110 Hypertensive heart disease with heart failure: Secondary | ICD-10-CM | POA: Diagnosis not present

## 2015-06-14 DIAGNOSIS — Z96642 Presence of left artificial hip joint: Secondary | ICD-10-CM | POA: Diagnosis not present

## 2015-06-14 DIAGNOSIS — I2510 Atherosclerotic heart disease of native coronary artery without angina pectoris: Secondary | ICD-10-CM | POA: Diagnosis not present

## 2015-06-14 DIAGNOSIS — I5022 Chronic systolic (congestive) heart failure: Secondary | ICD-10-CM | POA: Diagnosis not present

## 2015-06-16 DIAGNOSIS — I2510 Atherosclerotic heart disease of native coronary artery without angina pectoris: Secondary | ICD-10-CM | POA: Diagnosis not present

## 2015-06-16 DIAGNOSIS — Z96642 Presence of left artificial hip joint: Secondary | ICD-10-CM | POA: Diagnosis not present

## 2015-06-16 DIAGNOSIS — I110 Hypertensive heart disease with heart failure: Secondary | ICD-10-CM | POA: Diagnosis not present

## 2015-06-16 DIAGNOSIS — Z5189 Encounter for other specified aftercare: Secondary | ICD-10-CM | POA: Diagnosis not present

## 2015-06-16 DIAGNOSIS — I340 Nonrheumatic mitral (valve) insufficiency: Secondary | ICD-10-CM | POA: Diagnosis not present

## 2015-06-16 DIAGNOSIS — I5022 Chronic systolic (congestive) heart failure: Secondary | ICD-10-CM | POA: Diagnosis not present

## 2015-06-23 DIAGNOSIS — I110 Hypertensive heart disease with heart failure: Secondary | ICD-10-CM | POA: Diagnosis not present

## 2015-06-23 DIAGNOSIS — Z5189 Encounter for other specified aftercare: Secondary | ICD-10-CM | POA: Diagnosis not present

## 2015-06-23 DIAGNOSIS — I2510 Atherosclerotic heart disease of native coronary artery without angina pectoris: Secondary | ICD-10-CM | POA: Diagnosis not present

## 2015-06-23 DIAGNOSIS — I5022 Chronic systolic (congestive) heart failure: Secondary | ICD-10-CM | POA: Diagnosis not present

## 2015-06-23 DIAGNOSIS — Z96642 Presence of left artificial hip joint: Secondary | ICD-10-CM | POA: Diagnosis not present

## 2015-06-23 DIAGNOSIS — I340 Nonrheumatic mitral (valve) insufficiency: Secondary | ICD-10-CM | POA: Diagnosis not present

## 2015-06-26 DIAGNOSIS — I2510 Atherosclerotic heart disease of native coronary artery without angina pectoris: Secondary | ICD-10-CM | POA: Diagnosis not present

## 2015-06-26 DIAGNOSIS — I5022 Chronic systolic (congestive) heart failure: Secondary | ICD-10-CM | POA: Diagnosis not present

## 2015-06-26 DIAGNOSIS — Z96642 Presence of left artificial hip joint: Secondary | ICD-10-CM | POA: Diagnosis not present

## 2015-06-26 DIAGNOSIS — I340 Nonrheumatic mitral (valve) insufficiency: Secondary | ICD-10-CM | POA: Diagnosis not present

## 2015-06-26 DIAGNOSIS — Z5189 Encounter for other specified aftercare: Secondary | ICD-10-CM | POA: Diagnosis not present

## 2015-06-26 DIAGNOSIS — I110 Hypertensive heart disease with heart failure: Secondary | ICD-10-CM | POA: Diagnosis not present

## 2015-06-28 DIAGNOSIS — I5022 Chronic systolic (congestive) heart failure: Secondary | ICD-10-CM | POA: Diagnosis not present

## 2015-06-28 DIAGNOSIS — Z5189 Encounter for other specified aftercare: Secondary | ICD-10-CM | POA: Diagnosis not present

## 2015-06-28 DIAGNOSIS — I110 Hypertensive heart disease with heart failure: Secondary | ICD-10-CM | POA: Diagnosis not present

## 2015-06-28 DIAGNOSIS — I340 Nonrheumatic mitral (valve) insufficiency: Secondary | ICD-10-CM | POA: Diagnosis not present

## 2015-06-28 DIAGNOSIS — Z96642 Presence of left artificial hip joint: Secondary | ICD-10-CM | POA: Diagnosis not present

## 2015-06-28 DIAGNOSIS — I2510 Atherosclerotic heart disease of native coronary artery without angina pectoris: Secondary | ICD-10-CM | POA: Diagnosis not present

## 2015-06-30 DIAGNOSIS — I340 Nonrheumatic mitral (valve) insufficiency: Secondary | ICD-10-CM | POA: Diagnosis not present

## 2015-06-30 DIAGNOSIS — Z96642 Presence of left artificial hip joint: Secondary | ICD-10-CM | POA: Diagnosis not present

## 2015-06-30 DIAGNOSIS — I5022 Chronic systolic (congestive) heart failure: Secondary | ICD-10-CM | POA: Diagnosis not present

## 2015-06-30 DIAGNOSIS — Z5189 Encounter for other specified aftercare: Secondary | ICD-10-CM | POA: Diagnosis not present

## 2015-06-30 DIAGNOSIS — I110 Hypertensive heart disease with heart failure: Secondary | ICD-10-CM | POA: Diagnosis not present

## 2015-06-30 DIAGNOSIS — I2510 Atherosclerotic heart disease of native coronary artery without angina pectoris: Secondary | ICD-10-CM | POA: Diagnosis not present

## 2015-07-03 DIAGNOSIS — Z87891 Personal history of nicotine dependence: Secondary | ICD-10-CM | POA: Diagnosis not present

## 2015-07-03 DIAGNOSIS — Z96653 Presence of artificial knee joint, bilateral: Secondary | ICD-10-CM | POA: Diagnosis not present

## 2015-07-03 DIAGNOSIS — I2510 Atherosclerotic heart disease of native coronary artery without angina pectoris: Secondary | ICD-10-CM | POA: Diagnosis not present

## 2015-07-03 DIAGNOSIS — Z96642 Presence of left artificial hip joint: Secondary | ICD-10-CM | POA: Diagnosis not present

## 2015-07-03 DIAGNOSIS — Z881 Allergy status to other antibiotic agents status: Secondary | ICD-10-CM | POA: Diagnosis not present

## 2015-07-03 DIAGNOSIS — R5383 Other fatigue: Secondary | ICD-10-CM | POA: Diagnosis not present

## 2015-07-03 DIAGNOSIS — I110 Hypertensive heart disease with heart failure: Secondary | ICD-10-CM | POA: Diagnosis not present

## 2015-07-03 DIAGNOSIS — I5022 Chronic systolic (congestive) heart failure: Secondary | ICD-10-CM | POA: Diagnosis not present

## 2015-07-03 DIAGNOSIS — I340 Nonrheumatic mitral (valve) insufficiency: Secondary | ICD-10-CM | POA: Diagnosis not present

## 2015-07-03 DIAGNOSIS — R0600 Dyspnea, unspecified: Secondary | ICD-10-CM | POA: Diagnosis not present

## 2015-07-03 DIAGNOSIS — Z90710 Acquired absence of both cervix and uterus: Secondary | ICD-10-CM | POA: Diagnosis not present

## 2015-07-03 DIAGNOSIS — Z7982 Long term (current) use of aspirin: Secondary | ICD-10-CM | POA: Diagnosis not present

## 2015-07-03 DIAGNOSIS — Z5189 Encounter for other specified aftercare: Secondary | ICD-10-CM | POA: Diagnosis not present

## 2015-07-03 DIAGNOSIS — Z79899 Other long term (current) drug therapy: Secondary | ICD-10-CM | POA: Diagnosis not present

## 2015-07-05 DIAGNOSIS — I110 Hypertensive heart disease with heart failure: Secondary | ICD-10-CM | POA: Diagnosis not present

## 2015-07-05 DIAGNOSIS — Z96642 Presence of left artificial hip joint: Secondary | ICD-10-CM | POA: Diagnosis not present

## 2015-07-05 DIAGNOSIS — I5022 Chronic systolic (congestive) heart failure: Secondary | ICD-10-CM | POA: Diagnosis not present

## 2015-07-05 DIAGNOSIS — I340 Nonrheumatic mitral (valve) insufficiency: Secondary | ICD-10-CM | POA: Diagnosis not present

## 2015-07-05 DIAGNOSIS — Z5189 Encounter for other specified aftercare: Secondary | ICD-10-CM | POA: Diagnosis not present

## 2015-07-05 DIAGNOSIS — I2510 Atherosclerotic heart disease of native coronary artery without angina pectoris: Secondary | ICD-10-CM | POA: Diagnosis not present

## 2015-07-06 ENCOUNTER — Ambulatory Visit: Payer: Medicare Other | Admitting: Cardiology

## 2015-07-07 DIAGNOSIS — I340 Nonrheumatic mitral (valve) insufficiency: Secondary | ICD-10-CM | POA: Diagnosis not present

## 2015-07-07 DIAGNOSIS — I110 Hypertensive heart disease with heart failure: Secondary | ICD-10-CM | POA: Diagnosis not present

## 2015-07-07 DIAGNOSIS — Z5189 Encounter for other specified aftercare: Secondary | ICD-10-CM | POA: Diagnosis not present

## 2015-07-07 DIAGNOSIS — I2510 Atherosclerotic heart disease of native coronary artery without angina pectoris: Secondary | ICD-10-CM | POA: Diagnosis not present

## 2015-07-07 DIAGNOSIS — I5022 Chronic systolic (congestive) heart failure: Secondary | ICD-10-CM | POA: Diagnosis not present

## 2015-07-07 DIAGNOSIS — Z96642 Presence of left artificial hip joint: Secondary | ICD-10-CM | POA: Diagnosis not present

## 2015-07-10 DIAGNOSIS — Z5189 Encounter for other specified aftercare: Secondary | ICD-10-CM | POA: Diagnosis not present

## 2015-07-10 DIAGNOSIS — I5022 Chronic systolic (congestive) heart failure: Secondary | ICD-10-CM | POA: Diagnosis not present

## 2015-07-10 DIAGNOSIS — I2510 Atherosclerotic heart disease of native coronary artery without angina pectoris: Secondary | ICD-10-CM | POA: Diagnosis not present

## 2015-07-10 DIAGNOSIS — I110 Hypertensive heart disease with heart failure: Secondary | ICD-10-CM | POA: Diagnosis not present

## 2015-07-10 DIAGNOSIS — Z96642 Presence of left artificial hip joint: Secondary | ICD-10-CM | POA: Diagnosis not present

## 2015-07-10 DIAGNOSIS — I340 Nonrheumatic mitral (valve) insufficiency: Secondary | ICD-10-CM | POA: Diagnosis not present

## 2015-07-12 DIAGNOSIS — I5022 Chronic systolic (congestive) heart failure: Secondary | ICD-10-CM | POA: Diagnosis not present

## 2015-07-12 DIAGNOSIS — I340 Nonrheumatic mitral (valve) insufficiency: Secondary | ICD-10-CM | POA: Diagnosis not present

## 2015-07-12 DIAGNOSIS — Z96642 Presence of left artificial hip joint: Secondary | ICD-10-CM | POA: Diagnosis not present

## 2015-07-12 DIAGNOSIS — I110 Hypertensive heart disease with heart failure: Secondary | ICD-10-CM | POA: Diagnosis not present

## 2015-07-12 DIAGNOSIS — Z5189 Encounter for other specified aftercare: Secondary | ICD-10-CM | POA: Diagnosis not present

## 2015-07-12 DIAGNOSIS — I2510 Atherosclerotic heart disease of native coronary artery without angina pectoris: Secondary | ICD-10-CM | POA: Diagnosis not present

## 2015-07-14 DIAGNOSIS — I5022 Chronic systolic (congestive) heart failure: Secondary | ICD-10-CM | POA: Diagnosis not present

## 2015-07-14 DIAGNOSIS — I2510 Atherosclerotic heart disease of native coronary artery without angina pectoris: Secondary | ICD-10-CM | POA: Diagnosis not present

## 2015-07-14 DIAGNOSIS — I340 Nonrheumatic mitral (valve) insufficiency: Secondary | ICD-10-CM | POA: Diagnosis not present

## 2015-07-14 DIAGNOSIS — Z5189 Encounter for other specified aftercare: Secondary | ICD-10-CM | POA: Diagnosis not present

## 2015-07-14 DIAGNOSIS — Z96642 Presence of left artificial hip joint: Secondary | ICD-10-CM | POA: Diagnosis not present

## 2015-07-14 DIAGNOSIS — I110 Hypertensive heart disease with heart failure: Secondary | ICD-10-CM | POA: Diagnosis not present

## 2015-07-17 DIAGNOSIS — Z96642 Presence of left artificial hip joint: Secondary | ICD-10-CM | POA: Diagnosis not present

## 2015-07-17 DIAGNOSIS — I340 Nonrheumatic mitral (valve) insufficiency: Secondary | ICD-10-CM | POA: Diagnosis not present

## 2015-07-17 DIAGNOSIS — Z5189 Encounter for other specified aftercare: Secondary | ICD-10-CM | POA: Diagnosis not present

## 2015-07-17 DIAGNOSIS — I2510 Atherosclerotic heart disease of native coronary artery without angina pectoris: Secondary | ICD-10-CM | POA: Diagnosis not present

## 2015-07-17 DIAGNOSIS — I5022 Chronic systolic (congestive) heart failure: Secondary | ICD-10-CM | POA: Diagnosis not present

## 2015-07-17 DIAGNOSIS — I110 Hypertensive heart disease with heart failure: Secondary | ICD-10-CM | POA: Diagnosis not present

## 2015-07-24 ENCOUNTER — Ambulatory Visit (INDEPENDENT_AMBULATORY_CARE_PROVIDER_SITE_OTHER): Payer: Medicare Other | Admitting: Cardiology

## 2015-07-24 ENCOUNTER — Encounter: Payer: Self-pay | Admitting: Cardiology

## 2015-07-24 VITALS — BP 133/73 | HR 58 | Ht 65.0 in | Wt 212.0 lb

## 2015-07-24 DIAGNOSIS — I10 Essential (primary) hypertension: Secondary | ICD-10-CM

## 2015-07-24 DIAGNOSIS — I5022 Chronic systolic (congestive) heart failure: Secondary | ICD-10-CM

## 2015-07-24 NOTE — Progress Notes (Signed)
Patient ID: Gina [name], female   DOB: 12/17/1940, 75 y.o.   MRN: [id number]     Clinical Summary Gina [name] is a 75 y.o.female seen today for follow up of the following medical problems  1. Chronic systolic HF - 123XX123 echo LVEF 123XX123, grade I diastolic dysfunction. Multiple WMAs.  - echo 10/2010 LVEF 40-45%, multiple WMAs.  - cath 2009 patent coronaries - echo 03/2015 LVEF 20-25% - from records appears she had severe LV systolic dysfunction several years ago (around 2009) that improved, but most recently has decreased again.   - last visit we increased coreg to 6.25mg  bid. She denies any significant new side effects. No recent SOB/DOE, no LE edema.    2. HTN - compliant with meds   3. OSA - compliant with CPAP Past Medical History  Diagnosis Date  . Ejection fraction     EF 25%...nondiagnostic MRI December 2009...  /  echo June, 2010  /   echo... December 08, 2009.... ejection fraction improved... EF 60% /  EF 30%... echo.... [place]... December 28, 2009 with CHFPLanned ICD / CRT... patient has seen Dr.Klein..EF improved October, 2011   . Cardiomyopathy, nonischemic ([place])     EF improved October, 2011  . Asymmetric septal hypertrophy (HCC)     echo....04/2009....patient encouraged the family to be screened elsewhere  . LVH (left ventricular hypertrophy)     moderately severe... echo.. June, 2010  /  EF improved October, 2011.... cancel plans for ICD  . Systolic heart failure     chronic  . Hypertension   . Mitral regurgitation     mild/ moderate...echo June, 2010  /   echo.. October, 2011.... mitral regurgitation  improved  . Aortic insufficiency     mild...echo... June 2 010  . Renal artery stenosis (HCC)     80% upper left   . Groin hematoma     Right-hematoma/abscess..repaired.. December, 2009  . Anemia     further workup needed... October, 2011.  [name] LBBB (left bundle [place] block)   . OSA (obstructive sleep apnea)   . Arthritis     Hips/Knees, severe,  requiring a walker  . Bradycardia   . Acute renal failure [facility])      hospitalized... December 08, 2009... improved with hydration in the hospital  . Atrial fibrillation ([place])      ??? atrial fibrillation during hospitalization October, 2011 ???..  . CAD (coronary artery disease)     Mild coronary disease, catheterization, 2009     Allergies  Allergen Reactions  . Cephalexin     REACTION: Rash to arms/legs     Current Outpatient Prescriptions  Medication Sig Dispense Refill  . acetaminophen (TYLENOL) 500 MG tablet Take 500 mg by mouth at bedtime.    [name] amLODipine (NORVASC) 5 MG tablet TAKE 1 TABLET BY MOUTH DAILY 30 tablet 6  . aspirin 81 MG tablet Take 81 mg by mouth daily.      . Calcium Carbonate-Vitamin D (CALCIUM 600/VITAMIN D) 600-400 MG-UNIT per tablet Take 1 tablet by mouth 2 (two) times daily.      . carvedilol (COREG) 6.25 MG tablet Take 1 tablet (6.25 mg total) by mouth 2 (two) times daily. 60 tablet 6  . Docusate Sodium 100 MG capsule Take 200 mg by mouth at bedtime.    . ferrous sulfate 325 (65 FE) MG tablet Take 325 mg by mouth daily.      . furosemide (LASIX) 80 MG tablet TAKE 1 TABLET BY MOUTH TWICE  DAILY 180 tablet 3  . hydrALAZINE (APRESOLINE) 50 MG tablet TAKE 1 TABLET BY MOUTH THREE TIMES DAILY 90 tablet 3  . isosorbide mononitrate (IMDUR) 60 MG 24 hr tablet TAKE 1 TABLET BY MOUTH EVERY DAY 30 tablet 3  . Multiple Vitamin (MULTIVITAMIN) tablet Take 1 tablet by mouth daily.    . Multiple Vitamins-Minerals (ICAPS PO) Take 1 capsule by mouth daily.    . potassium chloride SA (K-DUR,KLOR-CON) 20 MEQ tablet Take 1 tablet (20 mEq total) by mouth 2 (two) times daily.    . ramipril (ALTACE) 10 MG capsule Take 2 capsules (20 mg total) by mouth daily. 60 capsule 3  . spironolactone (ALDACTONE) 25 MG tablet Take 1 tablet (25 mg total) by mouth daily. 30 tablet 6   No current facility-administered medications for this visit.     Past Surgical History  Procedure Laterality  Date  . Abdominal hysterectomy    . Surgical repair of a catheteriztion associated femoral artery injury       Allergies  Allergen Reactions  . Cephalexin     REACTION: Rash to arms/legs      Family History  Problem Relation Age of Onset  . Rheumatic fever Mother     in her early 31's  . Alzheimer's disease Mother   . Stomach cancer Father 61    died 31  . Bradycardia Brother     has pacemaker  . Heart failure Paternal Grandmother   . Heart failure Paternal Grandfather      Social History Gina [name] reports that she quit smoking about 29 years ago. Her smoking use included Cigarettes. She started smoking about 56 years ago. She has a 24 pack-year smoking history. She has never used smokeless tobacco. Gina [name] reports that she does not drink alcohol.   Review of Systems CONSTITUTIONAL: No weight loss, fever, chills, weakness or fatigue.  HEENT: Eyes: No visual loss, blurred vision, double vision or yellow sclerae.No hearing loss, sneezing, congestion, runny nose or sore throat.  SKIN: No rash or itching.  CARDIOVASCULAR: per HPI RESPIRATORY: No shortness of breath, cough or sputum.  GASTROINTESTINAL: No anorexia, nausea, vomiting or diarrhea. No abdominal pain or blood.  GENITOURINARY: No burning on urination, no polyuria NEUROLOGICAL: No headache, dizziness, syncope, paralysis, ataxia, numbness or tingling in the extremities. No change in bowel or bladder control.  MUSCULOSKELETAL: No muscle, back pain, joint pain or stiffness.  LYMPHATICS: No enlarged nodes. No history of splenectomy.  PSYCHIATRIC: No history of depression or anxiety.  ENDOCRINOLOGIC: No reports of sweating, cold or heat intolerance. No polyuria or polydipsia.  [name]   Physical Examination Filed Vitals:   07/24/15 1418  BP: 133/73  Pulse: 58   Filed Vitals:   07/24/15 1418  Height: 5\' 5"  (1.651 m)  Weight: 212 lb (96.163 kg)    Gen: resting comfortably, no acute distress HEENT: no scleral  icterus, pupils equal round and reactive, no palptable cervical adenopathy,  CV: RRR, no m/r/g, no jvd Resp: Clear to auscultation bilaterally GI: abdomen is soft, non-tender, non-distended, normal bowel sounds, no hepatosplenomegaly MSK: extremities are warm, no edema.  Skin: warm, no rash Neuro:  no focal deficits Psych: appropriate affect   Diagnostic Studies 02/2008 cath FINDINGS: Hemodynamics: Mean right atrial pressure was 13 mmHg. RV 43/9, PA 47/28 with mean PA pressure of 37 mmHg. Mean pulmonary capillary wedge pressure of 23 mmHg. Aorta 181/90. Left ventricle 177/17/26. Mixed venous O2 saturation was 65%, aortic saturation 92%, SVR was 1355, cardiac output  was 6.55 liters per minute. Cardiac index was 3.45. Left ventriculography: Left ventricle was enlarged. There was global severe hypokinesis with an estimated ejection fraction of 25%. Coronary angiography: The right coronary artery was dominant and showed only luminal irregularities. Left main coronary artery was free from significant disease. The circumflex was a large vessel, there were 3 obtuse marginal vessels with a 30% ostial stenosis of the first obtuse marginal. There was a 30% stenosis in the proximal circumflex between the first and second obtuse marginals. The LAD system actually was a dual LAD type system, with a large diagonal that reached the apex. There is a mild 20% stenosis, just prior to the bifurcation of the large first diagonal off the LAD. The remainder of the LAD did not have significant disease. There were luminal irregularities in the large first diagonal. This vessel reached the apex. Non-selective abdominal aortography did show 2 right and 2 left renal arteries, there appeared to be an 80% ostial stenosis of the upper left renal artery. This was confirmed by a selective renal angiography.   02/2014 echo Study Conclusions  - Left ventricle: The cavity  size was normal. Wall thickness was increased in a pattern of moderate LVH. Sigmoid shaped basal septum. Systolic function was severely reduced. The estimated ejection fraction was in the range of 25% to 30%. Diffuse hypokinesis. There is severe hypokinesis of the mid-apicalanteroseptal, anterior, and apical myocardium. Doppler parameters are consistent with abnormal left ventricular relaxation (grade 1 diastolic dysfunction). - Ventricular septum: Septal motion showed abnormal function and dyssynergy. - Aortic valve: Mildly calcified annulus. Trileaflet. There was trivial regurgitation. - Ascending aorta: The ascending aorta was mildly dilated, 43 mm. - Mitral valve: Calcified annulus. There was trivial regurgitation. - Left atrium: The atrium was severely dilated. - Right atrium: Central venous pressure (est): 3 mm Hg. - Atrial septum: No defect or patent foramen ovale was identified. - Tricuspid valve: There was trivial regurgitation. - Pulmonary arteries: Systolic pressure could not be accurately estimated. - Pericardium, extracardiac: There was no pericardial effusion.  Impressions:  - Limited images. Unable to compare with prior study. Moderate LVH with sigmoid shaped basal septum, LVEF approximately 25-30% with diffuse hypokinesis, most severe mid to apical anteroseptal and anterior wall. Septal dyssynegry. Grade 1 diastolic dysfunction. Severe left atrial enlargement. MIldly dilated ascending aorta. Trivial aortic regurgitation. Trivial tricuspid regurgitation, unable to assess PASP.  Jan 2017 echo Study Conclusions  - Left ventricle: The cavity size was normal. Wall thickness was increased in a pattern of severe LVH. Systolic function was severely reduced. The estimated ejection fraction was in the range of 20% to 25%. Diffuse hypokinesis. Doppler parameters are consistent with abnormal left ventricular relaxation (grade  1 diastolic dysfunction). - Aortic valve: Mildly calcified annulus. Mildly thickened leaflets. Valve area (VTI): 2.43 cm^2. Valve area (Vmax): 2.39 cm^2. Valve area (Vmean): 2.28 cm^2. - Mitral valve: Mildly calcified annulus. Mildly thickened leaflets . There was mild regurgitation. - Left atrium: The atrium was moderately dilated. - Technically adequate study.    Assessment and Plan   1. Chronic systolic heart failure - no current symptoms - no further med changes today, low heart rates will not increase coreg at this time. - she initially wanted a trial of medical therapy before repeating her cath. We will repeat echo, if persistent LV dysfunction will once again recommend repeat cath, her 2009 study was negative however, however after that cath LVEF initially improved but has since dropped again - will also need to consider  ICD pending echo and cath results, thus far she is hesitant. EKG in clinic shows wide LBBB , she would be candidate for BiV.  - if persistent LV dysfunction consider transition to entresto next visit.    2. HTN - at goal,we will continue current meds   F/u 3 months. Likely ecommend cath pending echo results.      Arnoldo Lenis, M.D.

## 2015-07-24 NOTE — Patient Instructions (Signed)
Your physician recommends that you schedule a follow-up appointment in: 3 MONTHS WITH DR BRANCH  Your physician recommends that you continue on your current medications as directed. Please refer to the Current Medication list given to you today.  Your physician has requested that you have an echocardiogram. Echocardiography is a painless test that uses sound waves to create images of your heart. It provides your doctor with information about the size and shape of your heart and how well your heart's chambers and valves are working. This procedure takes approximately one hour. There are no restrictions for this procedure.   Thank you for choosing [facility]!!    

## 2015-07-27 ENCOUNTER — Other Ambulatory Visit: Payer: Self-pay

## 2015-07-27 ENCOUNTER — Ambulatory Visit (INDEPENDENT_AMBULATORY_CARE_PROVIDER_SITE_OTHER): Payer: Medicare Other

## 2015-07-27 DIAGNOSIS — I5022 Chronic systolic (congestive) heart failure: Secondary | ICD-10-CM

## 2015-08-01 DIAGNOSIS — I509 Heart failure, unspecified: Secondary | ICD-10-CM | POA: Diagnosis not present

## 2015-08-02 DIAGNOSIS — I509 Heart failure, unspecified: Secondary | ICD-10-CM | POA: Diagnosis not present

## 2015-08-02 DIAGNOSIS — M159 Polyosteoarthritis, unspecified: Secondary | ICD-10-CM | POA: Diagnosis not present

## 2015-08-02 DIAGNOSIS — I10 Essential (primary) hypertension: Secondary | ICD-10-CM | POA: Diagnosis not present

## 2015-08-04 ENCOUNTER — Other Ambulatory Visit: Payer: Self-pay | Admitting: Cardiology

## 2015-08-04 ENCOUNTER — Telehealth: Payer: Self-pay | Admitting: Cardiology

## 2015-08-04 ENCOUNTER — Telehealth: Payer: Self-pay | Admitting: *Deleted

## 2015-08-04 ENCOUNTER — Encounter: Payer: Self-pay | Admitting: *Deleted

## 2015-08-04 NOTE — Telephone Encounter (Signed)
Heart Catheretization 08/14/15 scheduled with Dr. Wilhemina Cash  Checking percert

## 2015-08-04 NOTE — Telephone Encounter (Signed)
Pt scheduled for cath 08/14/15 @ 12pm (date requested by pt) with Dr. Ellyn Hack. Gave verbal instructions and mailed pt letter.

## 2015-08-04 NOTE — Telephone Encounter (Signed)
-----   Message from Arnoldo Lenis, MD sent at 08/01/2015 11:42 AM EDT ----- Echo shows heart function remains weak at 20-25%. As we discussed at our visit, since her heart function remains weak I recommend repeating her right and left heart cath. If she is willing please arrange, if not please let me know and we can readdress at our next follow up   Gina Abts MD

## 2015-08-07 ENCOUNTER — Telehealth: Payer: Self-pay | Admitting: *Deleted

## 2015-08-07 NOTE — Telephone Encounter (Signed)
Patient called office to make office aware that she was told by her orthopedic surgeon, Dr. Alvino Chapel at [facility] in [facility] that due to having multiple joint replacements including both knees and left hip, that she is needed antibiotic therapy prior to certain procedure including a cardiac catheterization. Nurse advised patient to contact her orthopedic surgeon who recommended this to see if they could provide patient with the antibiotic that she needed. Patient advised to contact our office back if her orthopedic surgeon couldn't assist with this. Patient verbalized understanding of plan.

## 2015-08-08 NOTE — Telephone Encounter (Signed)
Thank you for information, I am not familiar with this recommendation but if recommended we would depend on her orthopedic to provide appropriate antibiotic prophylaxis  Zandra Abts MD

## 2015-08-09 DIAGNOSIS — M159 Polyosteoarthritis, unspecified: Secondary | ICD-10-CM | POA: Diagnosis not present

## 2015-08-09 DIAGNOSIS — I509 Heart failure, unspecified: Secondary | ICD-10-CM | POA: Diagnosis not present

## 2015-08-09 DIAGNOSIS — I10 Essential (primary) hypertension: Secondary | ICD-10-CM | POA: Diagnosis not present

## 2015-08-14 ENCOUNTER — Ambulatory Visit (HOSPITAL_COMMUNITY)
Admission: RE | Admit: 2015-08-14 | Discharge: 2015-08-14 | Disposition: A | Discharge status: Home / Self Care | Payer: Medicare Other | Source: Ambulatory Visit | Attending: Cardiology | Admitting: Cardiology

## 2015-08-14 ENCOUNTER — Encounter (HOSPITAL_COMMUNITY)
Admission: RE | Disposition: A | Discharge status: Home / Self Care | Payer: Self-pay | Source: Ambulatory Visit | Attending: Cardiology

## 2015-08-14 DIAGNOSIS — I428 Other cardiomyopathies: Secondary | ICD-10-CM

## 2015-08-14 DIAGNOSIS — G4733 Obstructive sleep apnea (adult) (pediatric): Secondary | ICD-10-CM | POA: Diagnosis not present

## 2015-08-14 DIAGNOSIS — I5042 Chronic combined systolic (congestive) and diastolic (congestive) heart failure: Secondary | ICD-10-CM | POA: Insufficient documentation

## 2015-08-14 DIAGNOSIS — Z79899 Other long term (current) drug therapy: Secondary | ICD-10-CM | POA: Diagnosis not present

## 2015-08-14 DIAGNOSIS — I2510 Atherosclerotic heart disease of native coronary artery without angina pectoris: Secondary | ICD-10-CM | POA: Diagnosis present

## 2015-08-14 DIAGNOSIS — I110 Hypertensive heart disease with heart failure: Secondary | ICD-10-CM | POA: Diagnosis not present

## 2015-08-14 DIAGNOSIS — I4891 Unspecified atrial fibrillation: Secondary | ICD-10-CM | POA: Diagnosis not present

## 2015-08-14 DIAGNOSIS — Z7982 Long term (current) use of aspirin: Secondary | ICD-10-CM | POA: Diagnosis not present

## 2015-08-14 DIAGNOSIS — I340 Nonrheumatic mitral (valve) insufficiency: Secondary | ICD-10-CM

## 2015-08-14 DIAGNOSIS — I429 Cardiomyopathy, unspecified: Secondary | ICD-10-CM | POA: Diagnosis not present

## 2015-08-14 DIAGNOSIS — I420 Dilated cardiomyopathy: Secondary | ICD-10-CM | POA: Insufficient documentation

## 2015-08-14 DIAGNOSIS — I447 Left bundle-branch block, unspecified: Secondary | ICD-10-CM | POA: Insufficient documentation

## 2015-08-14 DIAGNOSIS — Z87891 Personal history of nicotine dependence: Secondary | ICD-10-CM | POA: Insufficient documentation

## 2015-08-14 DIAGNOSIS — R0609 Other forms of dyspnea: Secondary | ICD-10-CM | POA: Diagnosis present

## 2015-08-14 HISTORY — PX: CARDIAC CATHETERIZATION: SHX172

## 2015-08-14 LAB — POCT I-STAT 3, VENOUS BLOOD GAS (G3P V)
Acid-Base Excess: 2 mmol/L (ref 0.0–2.0)
Bicarbonate: 27.4 mEq/L — ABNORMAL HIGH (ref 20.0–24.0)
O2 Saturation: 64 %
TCO2: 29 mmol/L (ref 0–100)
pCO2, Ven: 45.5 mmHg (ref 45.0–50.0)
pH, Ven: 7.388 — ABNORMAL HIGH (ref 7.250–7.300)
pO2, Ven: 34 mmHg (ref 31.0–45.0)

## 2015-08-14 LAB — POCT I-STAT 3, ART BLOOD GAS (G3+)
Bicarbonate: 24.9 mEq/L — ABNORMAL HIGH (ref 20.0–24.0)
O2 Saturation: 95 %
TCO2: 26 mmol/L (ref 0–100)
pCO2 arterial: 39.4 mmHg (ref 35.0–45.0)
pH, Arterial: 7.408 (ref 7.350–7.450)
pO2, Arterial: 77 mmHg — ABNORMAL LOW (ref 80.0–100.0)

## 2015-08-14 LAB — CBC
HCT: 38.6 % (ref 36.0–46.0)
Hemoglobin: 12.4 g/dL (ref 12.0–15.0)
MCH: 29.2 pg (ref 26.0–34.0)
MCHC: 32.1 g/dL (ref 30.0–36.0)
MCV: 91 fL (ref 78.0–100.0)
Platelets: 192 10*3/uL (ref 150–400)
RBC: 4.24 MIL/uL (ref 3.87–5.11)
RDW: 13.4 % (ref 11.5–15.5)
WBC: 5.1 10*3/uL (ref 4.0–10.5)

## 2015-08-14 LAB — BASIC METABOLIC PANEL
Anion gap: 8 (ref 5–15)
BUN: 47 mg/dL — ABNORMAL HIGH (ref 6–20)
CO2: 27 mmol/L (ref 22–32)
Calcium: 9.7 mg/dL (ref 8.9–10.3)
Chloride: 104 mmol/L (ref 101–111)
Creatinine, Ser: 1.6 mg/dL — ABNORMAL HIGH (ref 0.44–1.00)
GFR calc Af Amer: 36 mL/min — ABNORMAL LOW (ref >60)
GFR calc non Af Amer: 31 mL/min — ABNORMAL LOW (ref >60)
Glucose, Bld: 97 mg/dL (ref 65–99)
Potassium: 4.4 mmol/L (ref 3.5–5.1)
Sodium: 139 mmol/L (ref 135–145)

## 2015-08-14 LAB — PROTIME-INR
INR: 1.03 (ref 0.00–1.49)
Prothrombin Time: 13.7 seconds (ref 11.6–15.2)

## 2015-08-14 SURGERY — RIGHT/LEFT HEART CATH AND CORONARY ANGIOGRAPHY
Anesthesia: LOCAL

## 2015-08-14 MED ORDER — IOPAMIDOL (ISOVUE-370) INJECTION 76%
INTRAVENOUS | Status: AC
  Filled 2015-08-14: qty 100

## 2015-08-14 MED ORDER — SODIUM CHLORIDE 0.9 % IV SOLN: 250.0000 mL | INTRAVENOUS | Status: DC | PRN

## 2015-08-14 MED ORDER — SODIUM CHLORIDE 0.9 % IV SOLN
INTRAVENOUS | Status: DC
  Administered 2015-08-14: 11:00:00 via INTRAVENOUS

## 2015-08-14 MED ORDER — HEPARIN (PORCINE) IN NACL 2-0.9 UNIT/ML-% IJ SOLN
INTRAMUSCULAR | Status: AC
  Filled 2015-08-14: qty 1000

## 2015-08-14 MED ORDER — FENTANYL CITRATE (PF) 100 MCG/2ML IJ SOLN
INTRAMUSCULAR | Status: DC | PRN
  Administered 2015-08-14: 25 ug via INTRAVENOUS

## 2015-08-14 MED ORDER — ASPIRIN 81 MG PO CHEW: 81.0000 mg | CHEWABLE_TABLET | ORAL | Status: AC

## 2015-08-14 MED ORDER — FENTANYL CITRATE (PF) 100 MCG/2ML IJ SOLN
INTRAMUSCULAR | Status: AC
  Filled 2015-08-14: qty 2

## 2015-08-14 MED ORDER — MIDAZOLAM HCL 2 MG/2ML IJ SOLN
INTRAMUSCULAR | Status: AC
  Filled 2015-08-14: qty 2

## 2015-08-14 MED ORDER — IOPAMIDOL (ISOVUE-370) INJECTION 76%
INTRAVENOUS | Status: DC | PRN
  Administered 2015-08-14: 90 mL via INTRAVENOUS

## 2015-08-14 MED ORDER — ASPIRIN 81 MG PO CHEW: 81.0000 mg | CHEWABLE_TABLET | ORAL | Status: DC

## 2015-08-14 MED ORDER — MIDAZOLAM HCL 2 MG/2ML IJ SOLN
INTRAMUSCULAR | Status: DC | PRN
  Administered 2015-08-14: 1 mg via INTRAVENOUS

## 2015-08-14 MED ORDER — SODIUM CHLORIDE 0.9% FLUSH: 3.0000 mL | INTRAVENOUS | Status: DC | PRN

## 2015-08-14 MED ORDER — VERAPAMIL HCL 2.5 MG/ML IV SOLN
INTRAVENOUS | Status: AC
  Filled 2015-08-14: qty 2

## 2015-08-14 MED ORDER — HEPARIN (PORCINE) IN NACL 2-0.9 UNIT/ML-% IJ SOLN
INTRAMUSCULAR | Status: DC | PRN
  Administered 2015-08-14: 10 mL via INTRA_ARTERIAL

## 2015-08-14 MED ORDER — LIDOCAINE HCL (PF) 1 % IJ SOLN
INTRAMUSCULAR | Status: AC
  Filled 2015-08-14: qty 30

## 2015-08-14 MED ORDER — HEPARIN (PORCINE) IN NACL 2-0.9 UNIT/ML-% IJ SOLN
INTRAMUSCULAR | Status: DC | PRN
  Administered 2015-08-14: 1000 mL

## 2015-08-14 MED ORDER — HEPARIN SODIUM (PORCINE) 1000 UNIT/ML IJ SOLN
INTRAMUSCULAR | Status: AC
  Filled 2015-08-14: qty 1

## 2015-08-14 MED ORDER — SODIUM CHLORIDE 0.9% FLUSH: 3.0000 mL | Freq: Two times a day (BID) | INTRAVENOUS | Status: DC

## 2015-08-14 MED ORDER — LIDOCAINE HCL (PF) 1 % IJ SOLN
INTRAMUSCULAR | Status: DC | PRN
  Administered 2015-08-14: 15 mL
  Administered 2015-08-14: 3 mL
  Administered 2015-08-14: 12 mL

## 2015-08-14 SURGICAL SUPPLY — 16 items
CATH INFINITI 5FR ANG PIGTAIL (CATHETERS) ×2 IMPLANT
CATH INFINITI 5FR JL4 (CATHETERS) ×2 IMPLANT
CATH INFINITI JR4 5F (CATHETERS) ×2 IMPLANT
CATH OPTITORQUE TIG 4.0 5F (CATHETERS) ×2 IMPLANT
CATH SWAN GANZ 7F STRAIGHT (CATHETERS) ×2 IMPLANT
GLIDESHEATH SLEND A-KIT 6F 22G (SHEATH) ×2 IMPLANT
KIT HEART LEFT (KITS) ×2 IMPLANT
PACK CARDIAC CATHETERIZATION (CUSTOM PROCEDURE TRAY) ×2 IMPLANT
SHEATH FAST CATH BRACH 5F 5CM (SHEATH) ×2 IMPLANT
SHEATH PINNACLE 5F 10CM (SHEATH) ×2 IMPLANT
SHEATH PINNACLE 7F 10CM (SHEATH) ×2 IMPLANT
TRANSDUCER W/STOPCOCK (MISCELLANEOUS) ×2 IMPLANT
TUBING CIL FLEX 10 FLL-RA (TUBING) ×2 IMPLANT
WIRE EMERALD 3MM-J .035X150CM (WIRE) ×2 IMPLANT
WIRE HI TORQ VERSACORE-J 145CM (WIRE) ×2 IMPLANT
WIRE SAFE-T 1.5MM-J .035X260CM (WIRE) ×2 IMPLANT

## 2015-08-14 NOTE — Progress Notes (Signed)
Site area: right groin a 5 french arterial and 7 french venous sheath was removed  Site Prior to Removal:  Level 0  Pressure Applied For 30 MINUTES    Bedrest Beginning at 1405p  Manual:   Yes.    Patient Status During Pull:  stable  Post Pull Groin Site:  Level 0  Post Pull Instructions Given:  Yes.    Post Pull Pulses Present:  Yes.    Dressing Applied:  Yes.    Comments:  VS remain stable during sheath pull.  Pressure dressing applied.

## 2015-08-14 NOTE — Discharge Instructions (Signed)
Angiogram, Care After °These instructions give you information about caring for yourself after your procedure. Your doctor may also give you more specific instructions. Call your doctor if you have any problems or questions after your procedure.  °HOME CARE °· Take medicines only as told by your doctor. °· Follow your doctor's instructions about: °¨ Care of the area where the tube was inserted. °¨ Bandage (dressing) changes and removal. °· You may shower 24-48 hours after the procedure or as told by your doctor. °· Do not take baths, swim, or use a hot tub until your doctor approves. °· Every day, check the area where the tube was inserted. Watch for: °¨ Redness, swelling, or pain. °¨ Fluid, blood, or pus. °· Do not apply powder or lotion to the site. °· Do not lift anything that is heavier than 10 lb (4.5 kg) for 5 days or as told by your doctor. °· Ask your doctor when you can: °¨ Return to work or school. °¨ Do physical activities or play sports. °¨ Have sex. °· Do not drive or operate heavy machinery for 24 hours or as told by your doctor. °· Have someone with you for the first 24 hours after the procedure. °· Keep all follow-up visits as told by your doctor. This is important. °GET HELP IF: °· You have a fever.   °· You have chills.   °· You have more bleeding from the area where the tube was inserted. Hold pressure on the area. °· You have redness, swelling, or pain in the area where the tube was inserted. °· You have fluid or pus coming from the area. °GET HELP RIGHT AWAY IF:  °· You have a lot of pain in the area where the tube was inserted. °· The area where the tube was inserted is bleeding, and the bleeding does not stop after 30 minutes of holding steady pressure on the area. °· The area near or just beyond the insertion site becomes pale, cool, tingly, or numb. °  °This information is not intended to replace advice given to you by your health care provider. Make sure you discuss any questions you have  with your health care provider. °  °Document Released: 05/17/2008 Document Revised: 03/11/2014 Document Reviewed: 07/22/2012 °Elsevier Interactive Patient Education ©2016 [employer] ° °Radial Site Care °Refer to this sheet in the next few weeks. These instructions provide you with information about caring for yourself after your procedure. Your health care provider may also give you more specific instructions. Your treatment has been planned according to current medical practices, but problems sometimes occur. Call your health care provider if you have any problems or questions after your procedure. °WHAT TO EXPECT AFTER THE PROCEDURE °After your procedure, it is typical to have the following: °· Bruising at the radial site that usually fades within 1-2 weeks. °· Blood collecting in the tissue (hematoma) that may be painful to the touch. It should usually decrease in size and tenderness within 1-2 weeks. °HOME CARE INSTRUCTIONS °· Take medicines only as directed by your health care provider. °· You may shower 24-48 hours after the procedure or as directed by your health care provider. Remove the bandage (dressing) and gently wash the site with plain soap and water. Pat the area dry with a clean towel. Do not rub the site, because this may cause bleeding. °· Do not take baths, swim, or use a hot tub until your health care provider approves. °· Check your insertion site every day for redness,   swelling, or drainage. °· Do not apply powder or lotion to the site. °· Do not flex or bend the affected arm for 24 hours or as directed by your health care provider. °· Do not push or pull heavy objects with the affected arm for 24 hours or as directed by your health care provider. °· Do not lift over 10 lb (4.5 kg) for 5 days after your procedure or as directed by your health care provider. °· Ask your health care provider when it is okay to: °¨ Return to work or school. °¨ Resume usual physical activities or  sports. °¨ Resume sexual activity. °· Do not drive home if you are discharged the same day as the procedure. Have someone else drive you. °· You may drive 24 hours after the procedure unless otherwise instructed by your health care provider. °· Do not operate machinery or power tools for 24 hours after the procedure. °· If your procedure was done as an outpatient procedure, which means that you went home the same day as your procedure, a responsible adult should be with you for the first 24 hours after you arrive home. °· Keep all follow-up visits as directed by your health care provider. This is important. °SEEK MEDICAL CARE IF: °· You have a fever. °· You have chills. °· You have increased bleeding from the radial site. Hold pressure on the site. °SEEK IMMEDIATE MEDICAL CARE IF: °· You have unusual pain at the radial site. °· You have redness, warmth, or swelling at the radial site. °· You have drainage (other than a small amount of blood on the dressing) from the radial site. °· The radial site is bleeding, and the bleeding does not stop after 30 minutes of holding steady pressure on the site. °· Your arm or hand becomes pale, cool, tingly, or numb. °  °This information is not intended to replace advice given to you by your health care provider. Make sure you discuss any questions you have with your health care provider. °  °Document Released: 03/23/2010 Document Revised: 03/11/2014 Document Reviewed: 09/06/2013 °Elsevier Interactive Patient Education ©2016 [employer] ° °

## 2015-08-14 NOTE — Interval H&P Note (Signed)
History and Physical Interval Note:  08/14/2015 12:21 PM  Gina [name]  has presented today for surgery, with the diagnosis of low EF - Chronic Combined Systolic & Diastolic HF.  The various methods of treatment have been discussed with the patient and family. After consideration of risks, benefits and other options for treatment, the patient has consented to  Procedure(s): Right/Left Heart Cath and Coronary Angiography (N/A) with possible Percutaneous Coronary Intervention as a surgical intervention .  The patient's history has been reviewed, patient examined, no change in status, stable for surgery.  I have reviewed the patient's chart and labs.  Questions were answered to the patient's satisfaction.    Cardiomyopathies (Right and Left Heart Catheterization OR  Right Heart Catheterization Alone With/Without Left Ventriculography and Coronary Angiography)   Patient Information:    Known or suspected cardiomyopathy with or without heart failure  AUC Score:   A (7)   Indication:   [address]    Glenetta Hew

## 2015-08-14 NOTE — H&P (View-Only) (Signed)
Patient ID: Gina [name], female   DOB: 21-Jul-1940, 75 y.o.   MRN: [id number]     Clinical Summary Gina [name] is a 75 y.o.female seen today for follow up of the following medical problems  1. Chronic systolic HF - 123XX123 echo LVEF 123XX123, grade I diastolic dysfunction. Multiple WMAs.  - echo 10/2010 LVEF 40-45%, multiple WMAs.  - cath 2009 patent coronaries - echo 03/2015 LVEF 20-25% - from records appears she had severe LV systolic dysfunction several years ago (around 2009) that improved, but most recently has decreased again.   - last visit we increased coreg to 6.25mg  bid. She denies any significant new side effects. No recent SOB/DOE, no LE edema.    2. HTN - compliant with meds   3. OSA - compliant with CPAP Past Medical History  Diagnosis Date  . Ejection fraction     EF 25%...nondiagnostic MRI December 2009...  /  echo June, 2010  /   echo... December 08, 2009.... ejection fraction improved... EF 60% /  EF 30%... echo.... [place]... December 28, 2009 with CHFPLanned ICD / CRT... patient has seen Dr.Klein..EF improved October, 2011   . Cardiomyopathy, nonischemic ([place])     EF improved October, 2011  . Asymmetric septal hypertrophy (HCC)     echo....04/2009....patient encouraged the family to be screened elsewhere  . LVH (left ventricular hypertrophy)     moderately severe... echo.. June, 2010  /  EF improved October, 2011.... cancel plans for ICD  . Systolic heart failure     chronic  . Hypertension   . Mitral regurgitation     mild/ moderate...echo June, 2010  /   echo.. October, 2011.... mitral regurgitation  improved  . Aortic insufficiency     mild...echo... June 2 010  . Renal artery stenosis (HCC)     80% upper left   . Groin hematoma     Right-hematoma/abscess..repaired.. December, 2009  . Anemia     further workup needed... October, 2011.  [name] LBBB (left bundle [place] block)   . OSA (obstructive sleep apnea)   . Arthritis     Hips/Knees, severe,  requiring a walker  . Bradycardia   . Acute renal failure [facility])      hospitalized... December 08, 2009... improved with hydration in the hospital  . Atrial fibrillation ([place])      ??? atrial fibrillation during hospitalization October, 2011 ???..  . CAD (coronary artery disease)     Mild coronary disease, catheterization, 2009     Allergies  Allergen Reactions  . Cephalexin     REACTION: Rash to arms/legs     Current Outpatient Prescriptions  Medication Sig Dispense Refill  . acetaminophen (TYLENOL) 500 MG tablet Take 500 mg by mouth at bedtime.    [name] amLODipine (NORVASC) 5 MG tablet TAKE 1 TABLET BY MOUTH DAILY 30 tablet 6  . aspirin 81 MG tablet Take 81 mg by mouth daily.      . Calcium Carbonate-Vitamin D (CALCIUM 600/VITAMIN D) 600-400 MG-UNIT per tablet Take 1 tablet by mouth 2 (two) times daily.      . carvedilol (COREG) 6.25 MG tablet Take 1 tablet (6.25 mg total) by mouth 2 (two) times daily. 60 tablet 6  . Docusate Sodium 100 MG capsule Take 200 mg by mouth at bedtime.    . ferrous sulfate 325 (65 FE) MG tablet Take 325 mg by mouth daily.      . furosemide (LASIX) 80 MG tablet TAKE 1 TABLET BY MOUTH TWICE  DAILY 180 tablet 3  . hydrALAZINE (APRESOLINE) 50 MG tablet TAKE 1 TABLET BY MOUTH THREE TIMES DAILY 90 tablet 3  . isosorbide mononitrate (IMDUR) 60 MG 24 hr tablet TAKE 1 TABLET BY MOUTH EVERY DAY 30 tablet 3  . Multiple Vitamin (MULTIVITAMIN) tablet Take 1 tablet by mouth daily.    . Multiple Vitamins-Minerals (ICAPS PO) Take 1 capsule by mouth daily.    . potassium chloride SA (K-DUR,KLOR-CON) 20 MEQ tablet Take 1 tablet (20 mEq total) by mouth 2 (two) times daily.    . ramipril (ALTACE) 10 MG capsule Take 2 capsules (20 mg total) by mouth daily. 60 capsule 3  . spironolactone (ALDACTONE) 25 MG tablet Take 1 tablet (25 mg total) by mouth daily. 30 tablet 6   No current facility-administered medications for this visit.     Past Surgical History  Procedure Laterality  Date  . Abdominal hysterectomy    . Surgical repair of a catheteriztion associated femoral artery injury       Allergies  Allergen Reactions  . Cephalexin     REACTION: Rash to arms/legs      Family History  Problem Relation Age of Onset  . Rheumatic fever Mother     in her early 6's  . Alzheimer's disease Mother   . Stomach cancer Father 54    died 2  . Bradycardia Brother     has pacemaker  . Heart failure Paternal Grandmother   . Heart failure Paternal Grandfather      Social History Gina [name] reports that she quit smoking about 29 years ago. Her smoking use included Cigarettes. She started smoking about 56 years ago. She has a 24 pack-year smoking history. She has never used smokeless tobacco. Gina [name] reports that she does not drink alcohol.   Review of Systems CONSTITUTIONAL: No weight loss, fever, chills, weakness or fatigue.  HEENT: Eyes: No visual loss, blurred vision, double vision or yellow sclerae.No hearing loss, sneezing, congestion, runny nose or sore throat.  SKIN: No rash or itching.  CARDIOVASCULAR: per HPI RESPIRATORY: No shortness of breath, cough or sputum.  GASTROINTESTINAL: No anorexia, nausea, vomiting or diarrhea. No abdominal pain or blood.  GENITOURINARY: No burning on urination, no polyuria NEUROLOGICAL: No headache, dizziness, syncope, paralysis, ataxia, numbness or tingling in the extremities. No change in bowel or bladder control.  MUSCULOSKELETAL: No muscle, back pain, joint pain or stiffness.  LYMPHATICS: No enlarged nodes. No history of splenectomy.  PSYCHIATRIC: No history of depression or anxiety.  ENDOCRINOLOGIC: No reports of sweating, cold or heat intolerance. No polyuria or polydipsia.  [name]   Physical Examination Filed Vitals:   07/24/15 1418  BP: 133/73  Pulse: 58   Filed Vitals:   07/24/15 1418  Height: 5\' 5"  (1.651 m)  Weight: 212 lb (96.163 kg)    Gen: resting comfortably, no acute distress HEENT: no scleral  icterus, pupils equal round and reactive, no palptable cervical adenopathy,  CV: RRR, no m/r/g, no jvd Resp: Clear to auscultation bilaterally GI: abdomen is soft, non-tender, non-distended, normal bowel sounds, no hepatosplenomegaly MSK: extremities are warm, no edema.  Skin: warm, no rash Neuro:  no focal deficits Psych: appropriate affect   Diagnostic Studies 02/2008 cath FINDINGS: Hemodynamics: Mean right atrial pressure was 13 mmHg. RV 43/9, PA 47/28 with mean PA pressure of 37 mmHg. Mean pulmonary capillary wedge pressure of 23 mmHg. Aorta 181/90. Left ventricle 177/17/26. Mixed venous O2 saturation was 65%, aortic saturation 92%, SVR was 1355, cardiac output  was 6.55 liters per minute. Cardiac index was 3.45. Left ventriculography: Left ventricle was enlarged. There was global severe hypokinesis with an estimated ejection fraction of 25%. Coronary angiography: The right coronary artery was dominant and showed only luminal irregularities. Left main coronary artery was free from significant disease. The circumflex was a large vessel, there were 3 obtuse marginal vessels with a 30% ostial stenosis of the first obtuse marginal. There was a 30% stenosis in the proximal circumflex between the first and second obtuse marginals. The LAD system actually was a dual LAD type system, with a large diagonal that reached the apex. There is a mild 20% stenosis, just prior to the bifurcation of the large first diagonal off the LAD. The remainder of the LAD did not have significant disease. There were luminal irregularities in the large first diagonal. This vessel reached the apex. Non-selective abdominal aortography did show 2 right and 2 left renal arteries, there appeared to be an 80% ostial stenosis of the upper left renal artery. This was confirmed by a selective renal angiography.   02/2014 echo Study Conclusions  - Left ventricle: The cavity  size was normal. Wall thickness was increased in a pattern of moderate LVH. Sigmoid shaped basal septum. Systolic function was severely reduced. The estimated ejection fraction was in the range of 25% to 30%. Diffuse hypokinesis. There is severe hypokinesis of the mid-apicalanteroseptal, anterior, and apical myocardium. Doppler parameters are consistent with abnormal left ventricular relaxation (grade 1 diastolic dysfunction). - Ventricular septum: Septal motion showed abnormal function and dyssynergy. - Aortic valve: Mildly calcified annulus. Trileaflet. There was trivial regurgitation. - Ascending aorta: The ascending aorta was mildly dilated, 43 mm. - Mitral valve: Calcified annulus. There was trivial regurgitation. - Left atrium: The atrium was severely dilated. - Right atrium: Central venous pressure (est): 3 mm Hg. - Atrial septum: No defect or patent foramen ovale was identified. - Tricuspid valve: There was trivial regurgitation. - Pulmonary arteries: Systolic pressure could not be accurately estimated. - Pericardium, extracardiac: There was no pericardial effusion.  Impressions:  - Limited images. Unable to compare with prior study. Moderate LVH with sigmoid shaped basal septum, LVEF approximately 25-30% with diffuse hypokinesis, most severe mid to apical anteroseptal and anterior wall. Septal dyssynegry. Grade 1 diastolic dysfunction. Severe left atrial enlargement. MIldly dilated ascending aorta. Trivial aortic regurgitation. Trivial tricuspid regurgitation, unable to assess PASP.  Jan 2017 echo Study Conclusions  - Left ventricle: The cavity size was normal. Wall thickness was increased in a pattern of severe LVH. Systolic function was severely reduced. The estimated ejection fraction was in the range of 20% to 25%. Diffuse hypokinesis. Doppler parameters are consistent with abnormal left ventricular relaxation (grade  1 diastolic dysfunction). - Aortic valve: Mildly calcified annulus. Mildly thickened leaflets. Valve area (VTI): 2.43 cm^2. Valve area (Vmax): 2.39 cm^2. Valve area (Vmean): 2.28 cm^2. - Mitral valve: Mildly calcified annulus. Mildly thickened leaflets . There was mild regurgitation. - Left atrium: The atrium was moderately dilated. - Technically adequate study.    Assessment and Plan   1. Chronic systolic heart failure - no current symptoms - no further med changes today, low heart rates will not increase coreg at this time. - she initially wanted a trial of medical therapy before repeating her cath. We will repeat echo, if persistent LV dysfunction will once again recommend repeat cath, her 2009 study was negative however, however after that cath LVEF initially improved but has since dropped again - will also need to consider  ICD pending echo and cath results, thus far she is hesitant. EKG in clinic shows wide LBBB , she would be candidate for BiV.  - if persistent LV dysfunction consider transition to entresto next visit.    2. HTN - at goal,we will continue current meds   F/u 3 months. Likely ecommend cath pending echo results.      Arnoldo Lenis, M.D.

## 2015-08-15 ENCOUNTER — Encounter (HOSPITAL_COMMUNITY): Payer: Self-pay | Admitting: Cardiology

## 2015-08-15 DIAGNOSIS — I5042 Chronic combined systolic (congestive) and diastolic (congestive) heart failure: Secondary | ICD-10-CM | POA: Diagnosis not present

## 2015-08-15 DIAGNOSIS — I110 Hypertensive heart disease with heart failure: Secondary | ICD-10-CM | POA: Diagnosis not present

## 2015-08-15 DIAGNOSIS — G4733 Obstructive sleep apnea (adult) (pediatric): Secondary | ICD-10-CM | POA: Diagnosis not present

## 2015-08-15 DIAGNOSIS — I4891 Unspecified atrial fibrillation: Secondary | ICD-10-CM | POA: Diagnosis not present

## 2015-08-15 DIAGNOSIS — I420 Dilated cardiomyopathy: Secondary | ICD-10-CM | POA: Diagnosis not present

## 2015-08-15 DIAGNOSIS — I447 Left bundle-branch block, unspecified: Secondary | ICD-10-CM | POA: Diagnosis not present

## 2015-08-15 MED FILL — Heparin Sodium (Porcine) Inj 1000 Unit/ML: INTRAMUSCULAR | Qty: 10 | Status: AC

## 2015-08-18 ENCOUNTER — Telehealth: Payer: Self-pay | Admitting: Cardiology

## 2015-08-18 NOTE — Telephone Encounter (Signed)
Gina [name] called stating that she had heart Cath on 08/14/15. She was wanting to know about a follow up appointment.

## 2015-08-18 NOTE — Telephone Encounter (Signed)
Patient notified.  Scheduled post cath visit for 09/20/2015 at 1:00 with Dr. Harl Bowie.

## 2015-08-18 NOTE — Telephone Encounter (Signed)
Id like to see her back in mid to late July please   Zandra Abts MD

## 2015-08-18 NOTE — Telephone Encounter (Signed)
Patient was not scheduled a post cath visit upon discharge from procedure.  She does have OV scheduled for 10/24/2015 which is a 3 month follow up.   Cath date was 08/14/15.  When do you want her back for follow up?

## 2015-09-01 ENCOUNTER — Other Ambulatory Visit: Payer: Self-pay | Admitting: Cardiology

## 2015-09-14 DIAGNOSIS — I10 Essential (primary) hypertension: Secondary | ICD-10-CM | POA: Diagnosis not present

## 2015-09-14 DIAGNOSIS — I509 Heart failure, unspecified: Secondary | ICD-10-CM | POA: Diagnosis not present

## 2015-09-14 DIAGNOSIS — M159 Polyosteoarthritis, unspecified: Secondary | ICD-10-CM | POA: Diagnosis not present

## 2015-09-20 ENCOUNTER — Ambulatory Visit (INDEPENDENT_AMBULATORY_CARE_PROVIDER_SITE_OTHER): Payer: Medicare Other | Admitting: Cardiology

## 2015-09-20 ENCOUNTER — Encounter: Payer: Self-pay | Admitting: Cardiology

## 2015-09-20 VITALS — BP 120/64 | HR 59 | Ht 65.0 in | Wt 213.0 lb

## 2015-09-20 DIAGNOSIS — I5022 Chronic systolic (congestive) heart failure: Secondary | ICD-10-CM | POA: Diagnosis not present

## 2015-09-20 DIAGNOSIS — I10 Essential (primary) hypertension: Secondary | ICD-10-CM | POA: Diagnosis not present

## 2015-09-20 MED ORDER — SACUBITRIL-VALSARTAN 49-51 MG PO TABS
1.0000 | ORAL_TABLET | Freq: Two times a day (BID) | ORAL | Status: DC
Start: 2015-09-20 — End: 2015-11-27

## 2015-09-20 MED ORDER — SACUBITRIL-VALSARTAN 49-51 MG PO TABS: 1.0000 | ORAL_TABLET | Freq: Two times a day (BID) | ORAL | Status: DC

## 2015-09-20 NOTE — Patient Instructions (Signed)
Your physician recommends that you schedule a follow-up appointment in: 2 MONTHS WITH DR. Godley  Your physician has recommended you make the following change in your medication:   STOP RAMIPRIL   48 HOURS AFTER STOPPING RAMIPRIL START ENTRESTO 49/51 MG TWICE DAILY  Your physician recommends that you return for lab work in: 2 WEEKS BMP/MG  Thank you for choosing [facility]!!

## 2015-09-20 NOTE — Progress Notes (Signed)
Clinical Summary Ms. Sula is a 75 y.o.female seen today for follow up of the following medical problems.   1. Chronic systolic HF - 123XX123 echo LVEF 123XX123, grade I diastolic dysfunction. Multiple WMAs.  - echo 10/2010 LVEF 40-45%, multiple WMAs.  - cath 2009 patent coronaries - echo 03/2015 LVEF 20-25% - from records appears she had severe LV systolic dysfunction several years ago (around 2009) that improved, but most recently has decreased again. - cath 08/2015 with patent coronaries - remains hesitant about ICD.  - no recent SOB, LE edema, orthopnea, or PND.   2. HTN - she is compliant with meds   3. OSA - compliant with CPAP Past Medical History  Diagnosis Date  . Ejection fraction     EF 25%...nondiagnostic MRI December 2009...  /  echo June, 2010  /   echo... December 08, 2009.... ejection fraction improved... EF 60% /  EF 30%... echo.... [place]... December 28, 2009 with CHFPLanned ICD / CRT... patient has seen Dr.Klein..EF improved October, 2011   . Cardiomyopathy, nonischemic ([place])     EF improved October, 2011  . Asymmetric septal hypertrophy (HCC)     echo....04/2009....patient encouraged the family to be screened elsewhere  . LVH (left ventricular hypertrophy)     moderately severe... echo.. June, 2010  /  EF improved October, 2011.... cancel plans for ICD  . Systolic heart failure     chronic  . Hypertension   . Mitral regurgitation     mild/ moderate...echo June, 2010  /   echo.. October, 2011.... mitral regurgitation  improved  . Aortic insufficiency     mild...echo... June 2 010  . Renal artery stenosis (HCC)     80% upper left   . Groin hematoma     Right-hematoma/abscess..repaired.. December, 2009  . Anemia     further workup needed... October, 2011.  [name] LBBB (left bundle [place] block)   . OSA (obstructive sleep apnea)   . Arthritis     Hips/Knees, severe, requiring a walker  . Bradycardia   . Acute renal failure [facility])      hospitalized...  December 08, 2009... improved with hydration in the hospital  . Atrial fibrillation ([place])      ??? atrial fibrillation during hospitalization October, 2011 ???..  . CAD (coronary artery disease)     Mild coronary disease, catheterization, 2009     Allergies  Allergen Reactions  . Cephalexin     REACTION: Rash to arms/legs     Current Outpatient Prescriptions  Medication Sig Dispense Refill  . amLODipine (NORVASC) 5 MG tablet TAKE 1 TABLET BY MOUTH EVERY DAY 30 tablet 3  . aspirin 81 MG tablet Take 81 mg by mouth daily.      . Calcium Carbonate-Vitamin D (CALCIUM 600/VITAMIN D) 600-400 MG-UNIT per tablet Take 2 tablets by mouth daily.     . carvedilol (COREG) 6.25 MG tablet Take 1 tablet (6.25 mg total) by mouth 2 (two) times daily. 60 tablet 6  . Docusate Sodium 100 MG capsule Take 200 mg by mouth at bedtime.    . ferrous sulfate 325 (65 FE) MG tablet Take 325 mg by mouth daily.      . furosemide (LASIX) 80 MG tablet TAKE 1 TABLET BY MOUTH TWICE DAILY 180 tablet 3  . hydrALAZINE (APRESOLINE) 50 MG tablet TAKE 1 TABLET BY MOUTH THREE TIMES DAILY 90 tablet 3  . isosorbide mononitrate (IMDUR) 60 MG 24 hr tablet TAKE 1 TABLET BY MOUTH  EVERY DAY 30 tablet 3  . Multiple Vitamin (MULTIVITAMIN) tablet Take 1 tablet by mouth daily.    . Multiple Vitamins-Minerals (ICAPS PO) Take 1 capsule by mouth daily.    . potassium chloride SA (K-DUR,KLOR-CON) 20 MEQ tablet Take 1 tablet (20 mEq total) by mouth 2 (two) times daily.    . ramipril (ALTACE) 10 MG capsule TAKE 2 CAPSULES BY MOUTH DAILY(DOSE INCREASE) 60 capsule 3  . spironolactone (ALDACTONE) 25 MG tablet Take 1 tablet (25 mg total) by mouth daily. 30 tablet 6   No current facility-administered medications for this visit.     Past Surgical History  Procedure Laterality Date  . Abdominal hysterectomy    . Surgical repair of a catheteriztion associated femoral artery injury    . Cardiac catheterization N/A 08/14/2015    Procedure:  Right/Left Heart Cath and Coronary Angiography;  Surgeon: Leonie Man, MD;  Location: [place] [facility];  Service: Cardiovascular;  Laterality: N/A;     Allergies  Allergen Reactions  . Cephalexin     REACTION: Rash to arms/legs      Family History  Problem Relation Age of Onset  . Rheumatic fever Mother     in her early 74's  . Alzheimer's disease Mother   . Stomach cancer Father 51    died 69  . Bradycardia Brother     has pacemaker  . Heart failure Paternal Grandmother   . Heart failure Paternal Grandfather      Social History Ms. Orand reports that she quit smoking about 29 years ago. Her smoking use included Cigarettes. She started smoking about 56 years ago. She has a 24 pack-year smoking history. She has never used smokeless tobacco. Ms. Huskey reports that she does not drink alcohol.   Review of Systems CONSTITUTIONAL: No weight loss, fever, chills, weakness or fatigue.  HEENT: Eyes: No visual loss, blurred vision, double vision or yellow sclerae.No hearing loss, sneezing, congestion, runny nose or sore throat.  SKIN: No rash or itching.  CARDIOVASCULAR: per HPI RESPIRATORY: No shortness of breath, cough or sputum.  GASTROINTESTINAL: No anorexia, nausea, vomiting or diarrhea. No abdominal pain or blood.  GENITOURINARY: No burning on urination, no polyuria NEUROLOGICAL: No headache, dizziness, syncope, paralysis, ataxia, numbness or tingling in the extremities. No change in bowel or bladder control.  MUSCULOSKELETAL: No muscle, back pain, joint pain or stiffness.  LYMPHATICS: No enlarged nodes. No history of splenectomy.  PSYCHIATRIC: No history of depression or anxiety.  ENDOCRINOLOGIC: No reports of sweating, cold or heat intolerance. No polyuria or polydipsia.  [name]   Physical Examination Filed Vitals:   09/20/15 1302  BP: 120/64  Pulse: 59   Filed Vitals:   09/20/15 1302  Height: 5\' 5"  (1.651 m)  Weight: 213 lb (96.616 kg)    Gen: resting  comfortably, no acute distress HEENT: no scleral icterus, pupils equal round and reactive, no palptable cervical adenopathy,  CV: RRR, no m/r/g, no jvd Resp: Clear to auscultation bilaterally GI: abdomen is soft, non-tender, non-distended, normal bowel sounds, no hepatosplenomegaly MSK: extremities are warm, no edema.  Skin: warm, no rash Neuro:  no focal deficits Psych: appropriate affect   Diagnostic Studies 02/2008 cath FINDINGS: Hemodynamics: Mean right atrial pressure was 13 mmHg. RV 43/9, PA 47/28 with mean PA pressure of 37 mmHg. Mean pulmonary capillary wedge pressure of 23 mmHg. Aorta 181/90. Left ventricle 177/17/26. Mixed venous O2 saturation was 65%, aortic saturation 92%, SVR was 1355, cardiac output was 6.55 liters per minute.  Cardiac index was 3.45. Left ventriculography: Left ventricle was enlarged. There was global severe hypokinesis with an estimated ejection fraction of 25%. Coronary angiography: The right coronary artery was dominant and showed only luminal irregularities. Left main coronary artery was free from significant disease. The circumflex was a large vessel, there were 3 obtuse marginal vessels with a 30% ostial stenosis of the first obtuse marginal. There was a 30% stenosis in the proximal circumflex between the first and second obtuse marginals. The LAD system actually was a dual LAD type system, with a large diagonal that reached the apex. There is a mild 20% stenosis, just prior to the bifurcation of the large first diagonal off the LAD. The remainder of the LAD did not have significant disease. There were luminal irregularities in the large first diagonal. This vessel reached the apex. Non-selective abdominal aortography did show 2 right and 2 left renal arteries, there appeared to be an 80% ostial stenosis of the upper left renal artery. This was confirmed by a selective renal angiography.   02/2014  echo Study Conclusions  - Left ventricle: The cavity size was normal. Wall thickness was increased in a pattern of moderate LVH. Sigmoid shaped basal septum. Systolic function was severely reduced. The estimated ejection fraction was in the range of 25% to 30%. Diffuse hypokinesis. There is severe hypokinesis of the mid-apicalanteroseptal, anterior, and apical myocardium. Doppler parameters are consistent with abnormal left ventricular relaxation (grade 1 diastolic dysfunction). - Ventricular septum: Septal motion showed abnormal function and dyssynergy. - Aortic valve: Mildly calcified annulus. Trileaflet. There was trivial regurgitation. - Ascending aorta: The ascending aorta was mildly dilated, 43 mm. - Mitral valve: Calcified annulus. There was trivial regurgitation. - Left atrium: The atrium was severely dilated. - Right atrium: Central venous pressure (est): 3 mm Hg. - Atrial septum: No defect or patent foramen ovale was identified. - Tricuspid valve: There was trivial regurgitation. - Pulmonary arteries: Systolic pressure could not be accurately estimated. - Pericardium, extracardiac: There was no pericardial effusion.  Impressions:  - Limited images. Unable to compare with prior study. Moderate LVH with sigmoid shaped basal septum, LVEF approximately 25-30% with diffuse hypokinesis, most severe mid to apical anteroseptal and anterior wall. Septal dyssynegry. Grade 1 diastolic dysfunction. Severe left atrial enlargement. MIldly dilated ascending aorta. Trivial aortic regurgitation. Trivial tricuspid regurgitation, unable to assess PASP.  Jan 2017 echo Study Conclusions  - Left ventricle: The cavity size was normal. Wall thickness was increased in a pattern of severe LVH. Systolic function was severely reduced. The estimated ejection fraction was in the range of 20% to 25%. Diffuse hypokinesis. Doppler parameters are consistent  with abnormal left ventricular relaxation (grade 1 diastolic dysfunction). - Aortic valve: Mildly calcified annulus. Mildly thickened leaflets. Valve area (VTI): 2.43 cm^2. Valve area (Vmax): 2.39 cm^2. Valve area (Vmean): 2.28 cm^2. - Mitral valve: Mildly calcified annulus. Mildly thickened leaflets . There was mild regurgitation. - Left atrium: The atrium was moderately dilated. - Technically adequate study.  08/2015 Cath 1. Angiographically normal coronary arteries 2. Nonischemic cardiomyopathy - conflicting cardiac outputs between Fick and thermodilution. Suspect that there is some valvular disease that is throwing off the thermodilution. 3. Essentially normal right heart pressures. Mildly elevated LVEDP and PCWP, but otherwise normal pressures.   Clearly nonischemic cardiomyopathy. The patient's itching anatomy with a tandem LAD system and codominant.  Plan:  Discharge after bedrest following femoral sheath removal.  Continue current management. She appears euvolemic  Assessment and Plan   1. Chronic systolic  heart failure - NICM, LVEF 20-25%, she is NYHA III. She remains not interested in ICD, if reconsiders consider CRT ICD - we will change ACE-I to entresto 49/51mg  bid, instructed to wait 48 hrs after stopping ramipril before taking entresto - check BMET and Mg    2. HTN - she is at goal,we will continue current meds   F/u 2 months.      Arnoldo Lenis, M.D., F.A.C.C.

## 2015-09-22 DIAGNOSIS — H43811 Vitreous degeneration, right eye: Secondary | ICD-10-CM | POA: Diagnosis not present

## 2015-10-05 DIAGNOSIS — I10 Essential (primary) hypertension: Secondary | ICD-10-CM | POA: Diagnosis not present

## 2015-10-20 ENCOUNTER — Encounter: Payer: Self-pay | Admitting: *Deleted

## 2015-10-23 DIAGNOSIS — Z7189 Other specified counseling: Secondary | ICD-10-CM | POA: Diagnosis not present

## 2015-10-23 DIAGNOSIS — Z1211 Encounter for screening for malignant neoplasm of colon: Secondary | ICD-10-CM | POA: Diagnosis not present

## 2015-10-23 DIAGNOSIS — Z0000 Encounter for general adult medical examination without abnormal findings: Secondary | ICD-10-CM | POA: Diagnosis not present

## 2015-10-23 DIAGNOSIS — Z1389 Encounter for screening for other disorder: Secondary | ICD-10-CM | POA: Diagnosis not present

## 2015-10-23 DIAGNOSIS — Z299 Encounter for prophylactic measures, unspecified: Secondary | ICD-10-CM | POA: Diagnosis not present

## 2015-10-23 DIAGNOSIS — E7800 Pure hypercholesterolemia, unspecified: Secondary | ICD-10-CM | POA: Diagnosis not present

## 2015-10-23 DIAGNOSIS — R5383 Other fatigue: Secondary | ICD-10-CM | POA: Diagnosis not present

## 2015-10-23 DIAGNOSIS — Z79899 Other long term (current) drug therapy: Secondary | ICD-10-CM | POA: Diagnosis not present

## 2015-10-24 ENCOUNTER — Ambulatory Visit: Payer: Medicare Other | Admitting: Cardiology

## 2015-10-24 ENCOUNTER — Telehealth: Payer: Self-pay | Admitting: *Deleted

## 2015-10-24 NOTE — Telephone Encounter (Signed)
Pt aware and says Dr. Woody Seller seen pt yesterday and decrease lasix 80 mg AM and 40 PM as well. Pt will have labs repeated on Friday at Dr. Woody Seller office and they would fax results

## 2015-10-24 NOTE — Telephone Encounter (Signed)
-----   Message from Arnoldo Lenis, MD sent at 10/24/2015 12:34 PM EDT ----- Labs show mild woresning of kidney function. Clarify how she is taking her lasix, I believe its 80mg  bid. Have her decrease to 80mg  in AM and 40mg  in PM  Zandra Abts MD

## 2015-10-27 DIAGNOSIS — Z79899 Other long term (current) drug therapy: Secondary | ICD-10-CM | POA: Diagnosis not present

## 2015-11-01 DIAGNOSIS — M159 Polyosteoarthritis, unspecified: Secondary | ICD-10-CM | POA: Diagnosis not present

## 2015-11-01 DIAGNOSIS — I10 Essential (primary) hypertension: Secondary | ICD-10-CM | POA: Diagnosis not present

## 2015-11-01 DIAGNOSIS — I509 Heart failure, unspecified: Secondary | ICD-10-CM | POA: Diagnosis not present

## 2015-11-07 DIAGNOSIS — H43811 Vitreous degeneration, right eye: Secondary | ICD-10-CM | POA: Diagnosis not present

## 2015-11-10 DIAGNOSIS — Z79899 Other long term (current) drug therapy: Secondary | ICD-10-CM | POA: Diagnosis not present

## 2015-11-24 ENCOUNTER — Encounter: Payer: Self-pay | Admitting: *Deleted

## 2015-11-27 ENCOUNTER — Encounter: Payer: Self-pay | Admitting: Cardiology

## 2015-11-27 ENCOUNTER — Ambulatory Visit (INDEPENDENT_AMBULATORY_CARE_PROVIDER_SITE_OTHER): Payer: Medicare Other | Admitting: Cardiology

## 2015-11-27 VITALS — BP 111/66 | HR 63 | Ht 65.0 in | Wt 216.0 lb

## 2015-11-27 DIAGNOSIS — I5022 Chronic systolic (congestive) heart failure: Secondary | ICD-10-CM | POA: Diagnosis not present

## 2015-11-27 DIAGNOSIS — I447 Left bundle-branch block, unspecified: Secondary | ICD-10-CM | POA: Diagnosis not present

## 2015-11-27 DIAGNOSIS — I10 Essential (primary) hypertension: Secondary | ICD-10-CM

## 2015-11-27 MED ORDER — SACUBITRIL-VALSARTAN 49-51 MG PO TABS: 1.0000 | ORAL_TABLET | Freq: Two times a day (BID) | ORAL | 0 refills | Status: DC

## 2015-11-27 MED ORDER — AMLODIPINE BESYLATE 5 MG PO TABS: 5.0000 mg | ORAL_TABLET | Freq: Every day | ORAL | Status: DC

## 2015-11-27 NOTE — Patient Instructions (Signed)
Medication Instructions:   Hold your Norvasc x 2 weeks, then call with update on how fatigue and blood pressure are doing.   Continue all other medications.    Labwork: none  Testing/Procedures: none  Follow-Up: 2 months   Any Other Special Instructions Will Be Listed Below (If Applicable). Entresto samples given today.    If you need a refill on your cardiac medications before your next appointment, please call your pharmacy.

## 2015-11-27 NOTE — Progress Notes (Signed)
Clinical Summary Gina [name] is a 75 y.o.female seen today for follow up of the following medical problems.   1. Chronic systolic HF - 89/3810 echo LVEF 17-51%, grade I diastolic dysfunction. Multiple WMAs.  - echo 10/2010 LVEF 40-45%, multiple WMAs.  - cath 2009 patent coronaries - echo 03/2015 LVEF 20-25% - from records appears she had severe LV systolic dysfunction several years ago (around 2009) that improved, but most recently has decreased again. - cath 08/2015 with patent coronaries - remains hesitant about ICD.    - last visit changed to entresto. Since that time has noted some low bp's at times, fatigue.  - labs showed mild worsening of renal function, lasix was decreased to 80mg  in AM and 40mg  in pm - she continues with cardiac rehab.    2. HTN -  compliant with meds - has noticed some blow bp's at times, SBPs in 90s.   3. OSA - compliant with CPAP Past Medical History:  Diagnosis Date  . Acute renal failure [facility])     hospitalized... December 08, 2009... improved with hydration in the hospital  . Anemia    further workup needed... October, 2011.  [name] Aortic insufficiency    mild...echo... June 2 010  . Arthritis    Hips/Knees, severe, requiring a walker  . Asymmetric septal hypertrophy (HCC)    echo....04/2009....patient encouraged the family to be screened elsewhere  . Atrial fibrillation ([place])     ??? atrial fibrillation during hospitalization October, 2011 ???..  . Bradycardia   . CAD (coronary artery disease)    Mild coronary disease, catheterization, 2009  . Cardiomyopathy, nonischemic ([place])    EF improved October, 2011  . Ejection fraction    EF 25%...nondiagnostic MRI December 2009...  /  echo June, 2010  /   echo... December 08, 2009.... ejection fraction improved... EF 60% /  EF 30%... echo.... [place]... December 28, 2009 with CHFPLanned ICD / CRT... patient has seen Dr.Klein..EF improved October, 2011   . Groin hematoma    Right-hematoma/abscess..repaired.. December, 2009  . Hypertension   . LBBB (left bundle Gina [name] block)   . LVH (left ventricular hypertrophy)    moderately severe... echo.. June, 2010  /  EF improved October, 2011.... cancel plans for ICD  . Mitral regurgitation    mild/ moderate...echo June, 2010  /   echo.. October, 2011.... mitral regurgitation  improved  . OSA (obstructive sleep apnea)   . Renal artery stenosis (HCC)    80% upper left   . Systolic heart failure    chronic     Allergies  Allergen Reactions  . Cephalexin     REACTION: Rash to arms/legs     Current Outpatient Prescriptions  Medication Sig Dispense Refill  . amLODipine (NORVASC) 5 MG tablet TAKE 1 TABLET BY MOUTH EVERY DAY 30 tablet 3  . aspirin 81 MG tablet Take 81 mg by mouth daily.      . Calcium Carbonate-Vitamin D (CALCIUM 600/VITAMIN D) 600-400 MG-UNIT per tablet Take 2 tablets by mouth daily.     . carvedilol (COREG) 6.25 MG tablet Take 1 tablet (6.25 mg total) by mouth 2 (two) times daily. 60 tablet 6  . Docusate Sodium 100 MG capsule Take 200 mg by mouth at bedtime.    . ferrous sulfate 325 (65 FE) MG tablet Take 325 mg by mouth daily.      . furosemide (LASIX) 80 MG tablet TAKE 1 TABLET BY MOUTH TWICE DAILY 180 tablet 3  .  hydrALAZINE (APRESOLINE) 50 MG tablet TAKE 1 TABLET BY MOUTH THREE TIMES DAILY 90 tablet 3  . isosorbide mononitrate (IMDUR) 60 MG 24 hr tablet TAKE 1 TABLET BY MOUTH EVERY DAY 30 tablet 3  . Multiple Vitamin (MULTIVITAMIN) tablet Take 1 tablet by mouth daily.    . Multiple Vitamins-Minerals (ICAPS PO) Take 1 capsule by mouth daily.    . potassium chloride SA (K-DUR,KLOR-CON) 20 MEQ tablet Take 1 tablet (20 mEq total) by mouth 2 (two) times daily.    . sacubitril-valsartan (ENTRESTO) 49-51 MG Take 1 tablet by mouth 2 (two) times daily. 28 tablet 0  . spironolactone (ALDACTONE) 25 MG tablet Take 1 tablet (25 mg total) by mouth daily. 30 tablet 6   No current facility-administered  medications for this visit.      Past Surgical History:  Procedure Laterality Date  . ABDOMINAL HYSTERECTOMY    . CARDIAC CATHETERIZATION N/A 08/14/2015   Procedure: Right/Left Heart Cath and Coronary Angiography;  Surgeon: Leonie Man, MD;  Location: [place] [facility];  Service: Cardiovascular;  Laterality: N/A;  . Surgical Repair of a Catheteriztion associated femoral artery injury       Allergies  Allergen Reactions  . Cephalexin     REACTION: Rash to arms/legs      Family History  Problem Relation Age of Onset  . Rheumatic fever Mother     in her early 23's  . Alzheimer's disease Mother   . Stomach cancer Father 8    died 94  . Bradycardia Brother     has pacemaker  . Heart failure Paternal Grandmother   . Heart failure Paternal Grandfather      Social History Ms. Newbrough reports that she quit smoking about 30 years ago. Her smoking use included Cigarettes. She started smoking about 57 years ago. She has a 24.00 pack-year smoking history. She has never used smokeless tobacco. Ms. Poth reports that she does not drink alcohol.   Review of Systems CONSTITUTIONAL: occasional fatigue HEENT: Eyes: No visual loss, blurred vision, double vision or yellow sclerae.No hearing loss, sneezing, congestion, runny nose or sore throat.  SKIN: No rash or itching.  CARDIOVASCULAR: per HPI RESPIRATORY: No shortness of breath, cough or sputum.  GASTROINTESTINAL: No anorexia, nausea, vomiting or diarrhea. No abdominal pain or blood.  GENITOURINARY: No burning on urination, no polyuria NEUROLOGICAL: No headache, dizziness, syncope, paralysis, ataxia, numbness or tingling in the extremities. No change in bowel or bladder control.  MUSCULOSKELETAL: No muscle, back pain, joint pain or stiffness.  LYMPHATICS: No enlarged nodes. No history of splenectomy.  PSYCHIATRIC: No history of depression or anxiety.  ENDOCRINOLOGIC: No reports of sweating, cold or heat intolerance. No polyuria  or polydipsia.  [name]   Physical Examination Vitals:   11/27/15 1505  BP: 111/66  Pulse: 63   Vitals:   11/27/15 1505  Weight: 216 lb (98 kg)  Height: 5\' 5"  (1.651 m)    Gen: resting comfortably, no acute distress HEENT: no scleral icterus, pupils equal round and reactive, no palptable cervical adenopathy,  CV: RRR, no m/r/g, no jvd Resp: Clear to auscultation bilaterally GI: abdomen is soft, non-tender, non-distended, normal bowel sounds, no hepatosplenomegaly MSK: extremities are warm, no edema.  Skin: warm, no rash Neuro:  no focal deficits Psych: appropriate affect   Diagnostic Studies  02/2008 cath FINDINGS: Hemodynamics: Mean right atrial pressure was 13 mmHg. RV 43/9, PA 47/28 with mean PA pressure of 37 mmHg. Mean pulmonary capillary wedge pressure of 23 mmHg.  Aorta 181/90. Left ventricle 177/17/26. Mixed venous O2 saturation was 65%, aortic saturation 92%, SVR was 1355, cardiac output was 6.55 liters per minute. Cardiac index was 3.45. Left ventriculography: Left ventricle was enlarged. There was global severe hypokinesis with an estimated ejection fraction of 25%. Coronary angiography: The right coronary artery was dominant and showed only luminal irregularities. Left main coronary artery was free from significant disease. The circumflex was a large vessel, there were 3 obtuse marginal vessels with a 30% ostial stenosis of the first obtuse marginal. There was a 30% stenosis in the proximal circumflex between the first and second obtuse marginals. The LAD system actually was a dual LAD type system, with a large diagonal that reached the apex. There is a mild 20% stenosis, just prior to the bifurcation of the large first diagonal off the LAD. The remainder of the LAD did not have significant disease. There were luminal irregularities in the large first diagonal. This vessel reached the apex. Non-selective abdominal aortography  did show 2 right and 2 left renal arteries, there appeared to be an 80% ostial stenosis of the upper left renal artery. This was confirmed by a selective renal angiography.   02/2014 echo Study Conclusions  - Left ventricle: The cavity size was normal. Wall thickness was increased in a pattern of moderate LVH. Sigmoid shaped basal septum. Systolic function was severely reduced. The estimated ejection fraction was in the range of 25% to 30%. Diffuse hypokinesis. There is severe hypokinesis of the mid-apicalanteroseptal, anterior, and apical myocardium. Doppler parameters are consistent with abnormal left ventricular relaxation (grade 1 diastolic dysfunction). - Ventricular septum: Septal motion showed abnormal function and dyssynergy. - Aortic valve: Mildly calcified annulus. Trileaflet. There was trivial regurgitation. - Ascending aorta: The ascending aorta was mildly dilated, 43 mm. - Mitral valve: Calcified annulus. There was trivial regurgitation. - Left atrium: The atrium was severely dilated. - Right atrium: Central venous pressure (est): 3 mm Hg. - Atrial septum: No defect or patent foramen ovale was identified. - Tricuspid valve: There was trivial regurgitation. - Pulmonary arteries: Systolic pressure could not be accurately estimated. - Pericardium, extracardiac: There was no pericardial effusion.  Impressions:  - Limited images. Unable to compare with prior study. Moderate LVH with sigmoid shaped basal septum, LVEF approximately 25-30% with diffuse hypokinesis, most severe mid to apical anteroseptal and anterior wall. Septal dyssynegry. Grade 1 diastolic dysfunction. Severe left atrial enlargement. MIldly dilated ascending aorta. Trivial aortic regurgitation. Trivial tricuspid regurgitation, unable to assess PASP.  Jan 2017 echo Study Conclusions  - Left ventricle: The cavity size was normal. Wall thickness was increased in  a pattern of severe LVH. Systolic function was severely reduced. The estimated ejection fraction was in the range of 20% to 25%. Diffuse hypokinesis. Doppler parameters are consistent with abnormal left ventricular relaxation (grade 1 diastolic dysfunction). - Aortic valve: Mildly calcified annulus. Mildly thickened leaflets. Valve area (VTI): 2.43 cm^2. Valve area (Vmax): 2.39 cm^2. Valve area (Vmean): 2.28 cm^2. - Mitral valve: Mildly calcified annulus. Mildly thickened leaflets . There was mild regurgitation. - Left atrium: The atrium was moderately dilated. - Technically adequate study.  08/2015 Cath 1. Angiographically normal coronary arteries 2. Nonischemic cardiomyopathy - conflicting cardiac outputs between Fick and thermodilution. Suspect that there is some valvular disease that is throwing off the thermodilution. 3. Essentially normal right heart pressures. Mildly elevated LVEDP and PCWP, but otherwise normal pressures.   Clearly nonischemic cardiomyopathy. The patient's itching anatomy with a tandem LAD system and codominant.  Plan:  Discharge after bedrest following femoral sheath removal.  Continue current management. She appears euvolemic   Assessment and Plan  1. Chronic systolic heart failure - NICM, LVEF 20-25%, she is NYHA III. She remains not interested in ICD, if reconsiders consider CRT ICD given her LBBB - some low bp's at times, we will stop her norvasc at this time and continue her current CHF meds. No further titration today.    2. HTN - low bp's at times, stop norvasc and follow   F/u 2 months. Request labs from pcp.       Arnoldo Lenis, M.D.

## 2015-12-02 ENCOUNTER — Other Ambulatory Visit: Payer: Self-pay | Admitting: Cardiology

## 2015-12-06 ENCOUNTER — Encounter: Payer: Self-pay | Admitting: *Deleted

## 2015-12-08 DIAGNOSIS — I509 Heart failure, unspecified: Secondary | ICD-10-CM | POA: Diagnosis not present

## 2015-12-08 DIAGNOSIS — I10 Essential (primary) hypertension: Secondary | ICD-10-CM | POA: Diagnosis not present

## 2015-12-08 DIAGNOSIS — M159 Polyosteoarthritis, unspecified: Secondary | ICD-10-CM | POA: Diagnosis not present

## 2015-12-27 DIAGNOSIS — Z23 Encounter for immunization: Secondary | ICD-10-CM | POA: Diagnosis not present

## 2015-12-30 ENCOUNTER — Other Ambulatory Visit: Payer: Self-pay | Admitting: Cardiology

## 2016-01-09 DIAGNOSIS — M159 Polyosteoarthritis, unspecified: Secondary | ICD-10-CM | POA: Diagnosis not present

## 2016-01-09 DIAGNOSIS — I10 Essential (primary) hypertension: Secondary | ICD-10-CM | POA: Diagnosis not present

## 2016-01-09 DIAGNOSIS — I509 Heart failure, unspecified: Secondary | ICD-10-CM | POA: Diagnosis not present

## 2016-01-27 ENCOUNTER — Other Ambulatory Visit: Payer: Self-pay | Admitting: Cardiology

## 2016-01-31 ENCOUNTER — Ambulatory Visit: Payer: Medicare Other | Admitting: Cardiology

## 2016-02-07 DIAGNOSIS — H43811 Vitreous degeneration, right eye: Secondary | ICD-10-CM | POA: Diagnosis not present

## 2016-02-08 DIAGNOSIS — Z96642 Presence of left artificial hip joint: Secondary | ICD-10-CM | POA: Diagnosis not present

## 2016-02-08 DIAGNOSIS — R0600 Dyspnea, unspecified: Secondary | ICD-10-CM | POA: Diagnosis not present

## 2016-02-08 DIAGNOSIS — Z96653 Presence of artificial knee joint, bilateral: Secondary | ICD-10-CM | POA: Diagnosis not present

## 2016-02-08 DIAGNOSIS — E2839 Other primary ovarian failure: Secondary | ICD-10-CM | POA: Diagnosis not present

## 2016-02-08 DIAGNOSIS — I2510 Atherosclerotic heart disease of native coronary artery without angina pectoris: Secondary | ICD-10-CM | POA: Diagnosis not present

## 2016-02-08 DIAGNOSIS — I110 Hypertensive heart disease with heart failure: Secondary | ICD-10-CM | POA: Diagnosis not present

## 2016-02-08 DIAGNOSIS — I5022 Chronic systolic (congestive) heart failure: Secondary | ICD-10-CM | POA: Diagnosis not present

## 2016-02-08 DIAGNOSIS — I340 Nonrheumatic mitral (valve) insufficiency: Secondary | ICD-10-CM | POA: Diagnosis not present

## 2016-02-08 DIAGNOSIS — Z881 Allergy status to other antibiotic agents status: Secondary | ICD-10-CM | POA: Diagnosis not present

## 2016-02-08 DIAGNOSIS — Z90710 Acquired absence of both cervix and uterus: Secondary | ICD-10-CM | POA: Diagnosis not present

## 2016-02-08 DIAGNOSIS — Z5189 Encounter for other specified aftercare: Secondary | ICD-10-CM | POA: Diagnosis not present

## 2016-02-08 DIAGNOSIS — R5383 Other fatigue: Secondary | ICD-10-CM | POA: Diagnosis not present

## 2016-02-08 DIAGNOSIS — Z87891 Personal history of nicotine dependence: Secondary | ICD-10-CM | POA: Diagnosis not present

## 2016-02-08 DIAGNOSIS — Z7982 Long term (current) use of aspirin: Secondary | ICD-10-CM | POA: Diagnosis not present

## 2016-02-08 DIAGNOSIS — Z79899 Other long term (current) drug therapy: Secondary | ICD-10-CM | POA: Diagnosis not present

## 2016-02-15 DIAGNOSIS — I10 Essential (primary) hypertension: Secondary | ICD-10-CM | POA: Diagnosis not present

## 2016-02-15 DIAGNOSIS — M159 Polyosteoarthritis, unspecified: Secondary | ICD-10-CM | POA: Diagnosis not present

## 2016-02-15 DIAGNOSIS — I509 Heart failure, unspecified: Secondary | ICD-10-CM | POA: Diagnosis not present

## 2016-02-28 ENCOUNTER — Other Ambulatory Visit: Payer: Self-pay | Admitting: Cardiology

## 2016-03-05 DIAGNOSIS — I340 Nonrheumatic mitral (valve) insufficiency: Secondary | ICD-10-CM | POA: Diagnosis not present

## 2016-03-05 DIAGNOSIS — Z90710 Acquired absence of both cervix and uterus: Secondary | ICD-10-CM | POA: Diagnosis not present

## 2016-03-05 DIAGNOSIS — Z79899 Other long term (current) drug therapy: Secondary | ICD-10-CM | POA: Diagnosis not present

## 2016-03-05 DIAGNOSIS — Z96642 Presence of left artificial hip joint: Secondary | ICD-10-CM | POA: Diagnosis not present

## 2016-03-05 DIAGNOSIS — Z7982 Long term (current) use of aspirin: Secondary | ICD-10-CM | POA: Diagnosis not present

## 2016-03-05 DIAGNOSIS — Z881 Allergy status to other antibiotic agents status: Secondary | ICD-10-CM | POA: Diagnosis not present

## 2016-03-05 DIAGNOSIS — R5383 Other fatigue: Secondary | ICD-10-CM | POA: Diagnosis not present

## 2016-03-05 DIAGNOSIS — R0600 Dyspnea, unspecified: Secondary | ICD-10-CM | POA: Diagnosis not present

## 2016-03-05 DIAGNOSIS — Z96653 Presence of artificial knee joint, bilateral: Secondary | ICD-10-CM | POA: Diagnosis not present

## 2016-03-05 DIAGNOSIS — Z5189 Encounter for other specified aftercare: Secondary | ICD-10-CM | POA: Diagnosis not present

## 2016-03-05 DIAGNOSIS — I5022 Chronic systolic (congestive) heart failure: Secondary | ICD-10-CM | POA: Diagnosis not present

## 2016-03-05 DIAGNOSIS — Z87891 Personal history of nicotine dependence: Secondary | ICD-10-CM | POA: Diagnosis not present

## 2016-03-05 DIAGNOSIS — I2510 Atherosclerotic heart disease of native coronary artery without angina pectoris: Secondary | ICD-10-CM | POA: Diagnosis not present

## 2016-03-05 DIAGNOSIS — I110 Hypertensive heart disease with heart failure: Secondary | ICD-10-CM | POA: Diagnosis not present

## 2016-03-13 DIAGNOSIS — I10 Essential (primary) hypertension: Secondary | ICD-10-CM | POA: Diagnosis not present

## 2016-03-13 DIAGNOSIS — I509 Heart failure, unspecified: Secondary | ICD-10-CM | POA: Diagnosis not present

## 2016-03-13 DIAGNOSIS — Z299 Encounter for prophylactic measures, unspecified: Secondary | ICD-10-CM | POA: Diagnosis not present

## 2016-03-13 DIAGNOSIS — R350 Frequency of micturition: Secondary | ICD-10-CM | POA: Diagnosis not present

## 2016-03-13 DIAGNOSIS — I429 Cardiomyopathy, unspecified: Secondary | ICD-10-CM | POA: Diagnosis not present

## 2016-03-13 DIAGNOSIS — Z789 Other specified health status: Secondary | ICD-10-CM | POA: Diagnosis not present

## 2016-03-14 DIAGNOSIS — I10 Essential (primary) hypertension: Secondary | ICD-10-CM | POA: Diagnosis not present

## 2016-03-14 DIAGNOSIS — M159 Polyosteoarthritis, unspecified: Secondary | ICD-10-CM | POA: Diagnosis not present

## 2016-03-14 DIAGNOSIS — I509 Heart failure, unspecified: Secondary | ICD-10-CM | POA: Diagnosis not present

## 2016-03-27 ENCOUNTER — Ambulatory Visit (INDEPENDENT_AMBULATORY_CARE_PROVIDER_SITE_OTHER): Payer: Medicare Other | Admitting: Cardiology

## 2016-03-27 ENCOUNTER — Encounter: Payer: Self-pay | Admitting: Cardiology

## 2016-03-27 VITALS — BP 108/64 | HR 68 | Ht 65.0 in | Wt 217.6 lb

## 2016-03-27 DIAGNOSIS — I447 Left bundle-branch block, unspecified: Secondary | ICD-10-CM

## 2016-03-27 DIAGNOSIS — G4733 Obstructive sleep apnea (adult) (pediatric): Secondary | ICD-10-CM

## 2016-03-27 DIAGNOSIS — I5022 Chronic systolic (congestive) heart failure: Secondary | ICD-10-CM

## 2016-03-27 DIAGNOSIS — I10 Essential (primary) hypertension: Secondary | ICD-10-CM | POA: Diagnosis not present

## 2016-03-27 MED ORDER — SACUBITRIL-VALSARTAN 49-51 MG PO TABS: 1.0000 | ORAL_TABLET | Freq: Two times a day (BID) | ORAL | 0 refills | Status: DC

## 2016-03-27 NOTE — Patient Instructions (Signed)
Your physician wants you to follow-up in: [place] DR. BRANCH You will receive a reminder letter in the mail two months in advance. If you don't receive a letter, please call our office to schedule the follow-up appointment.  Your physician recommends that you continue on your current medications as directed. Please refer to the Current Medication list given to you today.   WE HAVE GIVEN YOU SAMPLES OF ENSTRESTO  Your physician recommends that you return for lab work BMP/MG  Thank you for choosing [facility]!!

## 2016-03-27 NOTE — Progress Notes (Signed)
Clinical Summary Gina [name] is a 76 y.o.female seen today for follow up of the following medical problems.   1. Chronic systolic HF - 47/8295 echo LVEF 62-13%, grade I diastolic dysfunction. Multiple WMAs.  - echo 10/2010 LVEF 40-45%, multiple WMAs.  - cath 2009 patent coronaries -echo 03/2015 LVEF 20-25% - echo 07/2015 LVEF 20-25% - from records appears she had severe LV systolic dysfunction several years ago (around 2009) that improved, but most recently has decreased again. - cath 08/2015 with patent coronaries - remains hesitant about ICD.    - compliant with meds. No recent LE edema, no SOB/DOE - taking lasix 40mg  daily.   2. HTN -  compliant with meds - last visit we stopped her norvasc due to low bp's. BP's and symptoms resolved.    3. OSA - compliant with CPAP   Past Medical History:  Diagnosis Date  . Acute renal failure [facility])     hospitalized... December 08, 2009... improved with hydration in the hospital  . Anemia    further workup needed... October, 2011.  [name] Aortic insufficiency    mild...echo... June 2 010  . Arthritis    Hips/Knees, severe, requiring a walker  . Asymmetric septal hypertrophy (HCC)    echo....04/2009....patient encouraged the family to be screened elsewhere  . Atrial fibrillation ([place])     ??? atrial fibrillation during hospitalization October, 2011 ???..  . Bradycardia   . CAD (coronary artery disease)    Mild coronary disease, catheterization, 2009  . Cardiomyopathy, nonischemic ([place])    EF improved October, 2011  . Ejection fraction    EF 25%...nondiagnostic MRI December 2009...  /  echo June, 2010  /   echo... December 08, 2009.... ejection fraction improved... EF 60% /  EF 30%... echo.... [place]... December 28, 2009 with CHFPLanned ICD / CRT... patient has seen Dr.Klein..EF improved October, 2011   . Groin hematoma    Right-hematoma/abscess..repaired.. December, 2009  . Hypertension   . LBBB (left bundle branch block)   .  LVH (left ventricular hypertrophy)    moderately severe... echo.. June, 2010  /  EF improved October, 2011.... cancel plans for ICD  . Mitral regurgitation    mild/ moderate...echo June, 2010  /   echo.. October, 2011.... mitral regurgitation  improved  . OSA (obstructive sleep apnea)   . Renal artery stenosis (HCC)    80% upper left   . Systolic heart failure    chronic     Allergies  Allergen Reactions  . Cephalexin     REACTION: Rash to arms/legs     Current Outpatient Prescriptions  Medication Sig Dispense Refill  . amLODipine (NORVASC) 5 MG tablet Take 1 tablet (5 mg total) by mouth daily. (ON HOLD X 2 WEEKS, NOW THRU 12/11/2015)    . aspirin 81 MG tablet Take 81 mg by mouth daily.      . Calcium Carbonate-Vitamin D (CALCIUM 600/VITAMIN D) 600-400 MG-UNIT per tablet Take 2 tablets by mouth daily.     . carvedilol (COREG) 6.25 MG tablet TAKE 1 TABLET BY MOUTH TWICE DAILY 60 tablet 6  . Docusate Sodium 100 MG capsule Take 200 mg by mouth at bedtime.    [name] ENTRESTO 49-51 MG TAKE 1 TABLET BY MOUTH TWICE DAILY 60 tablet 0  . ferrous sulfate 325 (65 FE) MG tablet Take 325 mg by mouth daily.      . furosemide (LASIX) 40 MG tablet Take 40 mg by mouth daily.    [name]  hydrALAZINE (APRESOLINE) 50 MG tablet TAKE 1 TABLET BY MOUTH THREE TIMES DAILY 90 tablet 6  . isosorbide mononitrate (IMDUR) 60 MG 24 hr tablet TAKE 1 TABLET BY MOUTH EVERY DAY 30 tablet 6  . Multiple Vitamin (MULTIVITAMIN) tablet Take 1 tablet by mouth daily.    . Multiple Vitamins-Minerals (ICAPS PO) Take 1 capsule by mouth daily.    . potassium chloride SA (K-DUR,KLOR-CON) 20 MEQ tablet Take 1 tablet (20 mEq total) by mouth 2 (two) times daily.    . potassium chloride SA (K-DUR,KLOR-CON) 20 MEQ tablet TAKE 2 TABLETS BY MOUTH TWICE DAILY 120 tablet 0  . sacubitril-valsartan (ENTRESTO) 49-51 MG Take 1 tablet by mouth 2 (two) times daily. 28 tablet 0  . spironolactone (ALDACTONE) 25 MG tablet TAKE 1 TABLET BY MOUTH EVERY DAY 30  tablet 6   No current facility-administered medications for this visit.      Past Surgical History:  Procedure Laterality Date  . ABDOMINAL HYSTERECTOMY    . CARDIAC CATHETERIZATION N/A 08/14/2015   Procedure: Right/Left Heart Cath and Coronary Angiography;  Surgeon: Leonie Man, MD;  Location: [place] [facility];  Service: Cardiovascular;  Laterality: N/A;  . Surgical Repair of a Catheteriztion associated femoral artery injury       Allergies  Allergen Reactions  . Cephalexin     REACTION: Rash to arms/legs      Family History  Problem Relation Age of Onset  . Rheumatic fever Mother     in her early 18's  . Alzheimer's disease Mother   . Stomach cancer Father 34    died 45  . Bradycardia Brother     has pacemaker  . Heart failure Paternal Grandmother   . Heart failure Paternal Grandfather      Social History Gina [name] reports that she quit smoking about 30 years ago. Her smoking use included Cigarettes. She started smoking about 57 years ago. She has a 24.00 pack-year smoking history. She has never used smokeless tobacco. Gina [name] reports that she does not drink alcohol.   Review of Systems CONSTITUTIONAL: No weight loss, fever, chills, weakness or fatigue.  HEENT: Eyes: No visual loss, blurred vision, double vision or yellow sclerae.No hearing loss, sneezing, congestion, runny nose or sore throat.  SKIN: No rash or itching.  CARDIOVASCULAR: per HPI RESPIRATORY: No shortness of breath, cough or sputum.  GASTROINTESTINAL: No anorexia, nausea, vomiting or diarrhea. No abdominal pain or blood.  GENITOURINARY: No burning on urination, no polyuria NEUROLOGICAL: No headache, dizziness, syncope, paralysis, ataxia, numbness or tingling in the extremities. No change in bowel or bladder control.  MUSCULOSKELETAL: No muscle, back pain, joint pain or stiffness.  LYMPHATICS: No enlarged nodes. No history of splenectomy.  PSYCHIATRIC: No history of depression or anxiety.   ENDOCRINOLOGIC: No reports of sweating, cold or heat intolerance. No polyuria or polydipsia.  [name]   Physical Examination Vitals:   03/27/16 1311  BP: 108/64  Pulse: 68   Vitals:   03/27/16 1311  Weight: 217 lb 9.6 oz (98.7 kg)  Height: 5\' 5"  (1.651 m)    Gen: resting comfortably, no acute distress HEENT: no scleral icterus, pupils equal round and reactive, no palptable cervical adenopathy,  CV: RRR, no m/r/g, no jvd Resp: Clear to auscultation bilaterally GI: abdomen is soft, non-tender, non-distended, normal bowel sounds, no hepatosplenomegaly MSK: extremities are warm, no edema.  Skin: warm, no rash Neuro:  no focal deficits Psych: appropriate affect   Diagnostic Studies  02/2008 cath FINDINGS: Hemodynamics:  Mean right atrial pressure was 13 mmHg. RV 43/9, PA 47/28 with mean PA pressure of 37 mmHg. Mean pulmonary capillary wedge pressure of 23 mmHg. Aorta 181/90. Left ventricle 177/17/26. Mixed venous O2 saturation was 65%, aortic saturation 92%, SVR was 1355, cardiac output was 6.55 liters per minute. Cardiac index was 3.45. Left ventriculography: Left ventricle was enlarged. There was global severe hypokinesis with an estimated ejection fraction of 25%. Coronary angiography: The right coronary artery was dominant and showed only luminal irregularities. Left main coronary artery was free from significant disease. The circumflex was a large vessel, there were 3 obtuse marginal vessels with a 30% ostial stenosis of the first obtuse marginal. There was a 30% stenosis in the proximal circumflex between the first and second obtuse marginals. The LAD system actually was a dual LAD type system, with a large diagonal that reached the apex. There is a mild 20% stenosis, just prior to the bifurcation of the large first diagonal off the LAD. The remainder of the LAD did not have significant disease. There were luminal irregularities in the  large first diagonal. This vessel reached the apex. Non-selective abdominal aortography did show 2 right and 2 left renal arteries, there appeared to be an 80% ostial stenosis of the upper left renal artery. This was confirmed by a selective renal angiography.   02/2014 echo Study Conclusions  - Left ventricle: The cavity size was normal. Wall thickness was increased in a pattern of moderate LVH. Sigmoid shaped basal septum. Systolic function was severely reduced. The estimated ejection fraction was in the range of 25% to 30%. Diffuse hypokinesis. There is severe hypokinesis of the mid-apicalanteroseptal, anterior, and apical myocardium. Doppler parameters are consistent with abnormal left ventricular relaxation (grade 1 diastolic dysfunction). - Ventricular septum: Septal motion showed abnormal function and dyssynergy. - Aortic valve: Mildly calcified annulus. Trileaflet. There was trivial regurgitation. - Ascending aorta: The ascending aorta was mildly dilated, 43 mm. - Mitral valve: Calcified annulus. There was trivial regurgitation. - Left atrium: The atrium was severely dilated. - Right atrium: Central venous pressure (est): 3 mm Hg. - Atrial septum: No defect or patent foramen ovale was identified. - Tricuspid valve: There was trivial regurgitation. - Pulmonary arteries: Systolic pressure could not be accurately estimated. - Pericardium, extracardiac: There was no pericardial effusion.  Impressions:  - Limited images. Unable to compare with prior study. Moderate LVH with sigmoid shaped basal septum, LVEF approximately 25-30% with diffuse hypokinesis, most severe mid to apical anteroseptal and anterior wall. Septal dyssynegry. Grade 1 diastolic dysfunction. Severe left atrial enlargement. MIldly dilated ascending aorta. Trivial aortic regurgitation. Trivial tricuspid regurgitation, unable to assess PASP.  Jan 2017 echo Study  Conclusions  - Left ventricle: The cavity size was normal. Wall thickness was increased in a pattern of severe LVH. Systolic function was severely reduced. The estimated ejection fraction was in the range of 20% to 25%. Diffuse hypokinesis. Doppler parameters are consistent with abnormal left ventricular relaxation (grade 1 diastolic dysfunction). - Aortic valve: Mildly calcified annulus. Mildly thickened leaflets. Valve area (VTI): 2.43 cm^2. Valve area (Vmax): 2.39 cm^2. Valve area (Vmean): 2.28 cm^2. - Mitral valve: Mildly calcified annulus. Mildly thickened leaflets . There was mild regurgitation. - Left atrium: The atrium was moderately dilated. - Technically adequate study.  08/2015 Cath 1. Angiographically normal coronary arteries 2. Nonischemic cardiomyopathy - conflicting cardiac outputs between Fick and thermodilution. Suspect that there is some valvular disease that is throwing off the thermodilution. 3. Essentially normal right heart pressures. Mildly  elevated LVEDP and PCWP, but otherwise normal pressures.   Clearly nonischemic cardiomyopathy. The patient's itching anatomy with a tandem LAD system and codominant.  Plan:  Discharge after bedrest following femoral sheath removal.  Continue current management. She appears euvolemic    Assessment and Plan  1. Chronic systolic heart failure - NICM, LVEF 20-25%, she is NYHA III. She remains not interested in ICD, if reconsiders consider CRT ICD given her LBBB - we will continue current meds. No current symptoms.  - continue lasix, repeat kidney labs and electrolytes on diuretic.    2. HTN -low bp's resolved off norvasc, continue current meds  3. OSA - continue CPAP   F/u 4 months. Request labs from pcp.       Arnoldo Lenis, M.D

## 2016-03-29 ENCOUNTER — Other Ambulatory Visit: Payer: Self-pay | Admitting: Cardiology

## 2016-04-02 DIAGNOSIS — I10 Essential (primary) hypertension: Secondary | ICD-10-CM | POA: Diagnosis not present

## 2016-04-05 ENCOUNTER — Telehealth: Payer: Self-pay | Admitting: *Deleted

## 2016-04-05 MED ORDER — FUROSEMIDE 20 MG PO TABS: ORAL_TABLET | ORAL | 3 refills | Status: DC

## 2016-04-05 NOTE — Telephone Encounter (Signed)
-----   Message from Arnoldo Lenis, MD sent at 04/05/2016  3:02 PM EST ----- Renal funciton mildly decreased since last check. I would change her lasix to 40mg  alternating with 20mg , if significant increase in weight or edema would increase back ot 40mg  daily for a few days then cut back again  Zandra Abts MD

## 2016-04-05 NOTE — Telephone Encounter (Signed)
Pt voices understanding - requesting lasix 20 mg rx since pt has 80 mg lasix that she is currently cutting in halves. Routed to pcp - Medication sent to pharmacy.

## 2016-05-07 DIAGNOSIS — R5383 Other fatigue: Secondary | ICD-10-CM | POA: Diagnosis not present

## 2016-05-07 DIAGNOSIS — R0600 Dyspnea, unspecified: Secondary | ICD-10-CM | POA: Diagnosis not present

## 2016-05-07 DIAGNOSIS — Z90710 Acquired absence of both cervix and uterus: Secondary | ICD-10-CM | POA: Diagnosis not present

## 2016-05-07 DIAGNOSIS — Z87891 Personal history of nicotine dependence: Secondary | ICD-10-CM | POA: Diagnosis not present

## 2016-05-07 DIAGNOSIS — I340 Nonrheumatic mitral (valve) insufficiency: Secondary | ICD-10-CM | POA: Diagnosis not present

## 2016-05-07 DIAGNOSIS — I110 Hypertensive heart disease with heart failure: Secondary | ICD-10-CM | POA: Diagnosis not present

## 2016-05-07 DIAGNOSIS — I2510 Atherosclerotic heart disease of native coronary artery without angina pectoris: Secondary | ICD-10-CM | POA: Diagnosis not present

## 2016-05-07 DIAGNOSIS — I5022 Chronic systolic (congestive) heart failure: Secondary | ICD-10-CM | POA: Diagnosis not present

## 2016-05-07 DIAGNOSIS — Z7982 Long term (current) use of aspirin: Secondary | ICD-10-CM | POA: Diagnosis not present

## 2016-05-07 DIAGNOSIS — Z881 Allergy status to other antibiotic agents status: Secondary | ICD-10-CM | POA: Diagnosis not present

## 2016-05-07 DIAGNOSIS — Z96642 Presence of left artificial hip joint: Secondary | ICD-10-CM | POA: Diagnosis not present

## 2016-05-07 DIAGNOSIS — Z96653 Presence of artificial knee joint, bilateral: Secondary | ICD-10-CM | POA: Diagnosis not present

## 2016-05-07 DIAGNOSIS — Z79899 Other long term (current) drug therapy: Secondary | ICD-10-CM | POA: Diagnosis not present

## 2016-05-17 ENCOUNTER — Other Ambulatory Visit: Payer: Self-pay

## 2016-05-17 ENCOUNTER — Other Ambulatory Visit: Payer: Self-pay | Admitting: Cardiology

## 2016-06-07 DIAGNOSIS — I10 Essential (primary) hypertension: Secondary | ICD-10-CM | POA: Diagnosis not present

## 2016-06-07 DIAGNOSIS — M159 Polyosteoarthritis, unspecified: Secondary | ICD-10-CM | POA: Diagnosis not present

## 2016-06-07 DIAGNOSIS — I509 Heart failure, unspecified: Secondary | ICD-10-CM | POA: Diagnosis not present

## 2016-07-03 DIAGNOSIS — M159 Polyosteoarthritis, unspecified: Secondary | ICD-10-CM | POA: Diagnosis not present

## 2016-07-03 DIAGNOSIS — I10 Essential (primary) hypertension: Secondary | ICD-10-CM | POA: Diagnosis not present

## 2016-07-03 DIAGNOSIS — I509 Heart failure, unspecified: Secondary | ICD-10-CM | POA: Diagnosis not present

## 2016-07-30 ENCOUNTER — Other Ambulatory Visit: Payer: Self-pay | Admitting: Cardiology

## 2016-08-02 DIAGNOSIS — Z299 Encounter for prophylactic measures, unspecified: Secondary | ICD-10-CM | POA: Diagnosis not present

## 2016-08-02 DIAGNOSIS — I429 Cardiomyopathy, unspecified: Secondary | ICD-10-CM | POA: Diagnosis not present

## 2016-08-02 DIAGNOSIS — I10 Essential (primary) hypertension: Secondary | ICD-10-CM | POA: Diagnosis not present

## 2016-08-02 DIAGNOSIS — G4733 Obstructive sleep apnea (adult) (pediatric): Secondary | ICD-10-CM | POA: Diagnosis not present

## 2016-08-02 DIAGNOSIS — Z6835 Body mass index (BMI) 35.0-35.9, adult: Secondary | ICD-10-CM | POA: Diagnosis not present

## 2016-08-02 DIAGNOSIS — E7800 Pure hypercholesterolemia, unspecified: Secondary | ICD-10-CM | POA: Diagnosis not present

## 2016-08-02 DIAGNOSIS — I509 Heart failure, unspecified: Secondary | ICD-10-CM | POA: Diagnosis not present

## 2016-08-02 DIAGNOSIS — Z713 Dietary counseling and surveillance: Secondary | ICD-10-CM | POA: Diagnosis not present

## 2016-08-02 DIAGNOSIS — G4730 Sleep apnea, unspecified: Secondary | ICD-10-CM | POA: Diagnosis not present

## 2016-08-07 DIAGNOSIS — H3561 Retinal hemorrhage, right eye: Secondary | ICD-10-CM | POA: Diagnosis not present

## 2016-08-07 DIAGNOSIS — H43811 Vitreous degeneration, right eye: Secondary | ICD-10-CM | POA: Diagnosis not present

## 2016-08-09 DIAGNOSIS — I10 Essential (primary) hypertension: Secondary | ICD-10-CM | POA: Diagnosis not present

## 2016-08-09 DIAGNOSIS — I509 Heart failure, unspecified: Secondary | ICD-10-CM | POA: Diagnosis not present

## 2016-08-09 DIAGNOSIS — M159 Polyosteoarthritis, unspecified: Secondary | ICD-10-CM | POA: Diagnosis not present

## 2016-08-20 ENCOUNTER — Encounter: Payer: Self-pay | Admitting: Cardiology

## 2016-08-20 ENCOUNTER — Ambulatory Visit (INDEPENDENT_AMBULATORY_CARE_PROVIDER_SITE_OTHER): Payer: Medicare Other | Admitting: Cardiology

## 2016-08-20 VITALS — BP 115/56 | HR 49 | Ht 64.0 in | Wt 223.0 lb

## 2016-08-20 DIAGNOSIS — I5022 Chronic systolic (congestive) heart failure: Secondary | ICD-10-CM

## 2016-08-20 DIAGNOSIS — I10 Essential (primary) hypertension: Secondary | ICD-10-CM

## 2016-08-20 DIAGNOSIS — I428 Other cardiomyopathies: Secondary | ICD-10-CM | POA: Diagnosis not present

## 2016-08-20 NOTE — Patient Instructions (Signed)

## 2016-08-20 NOTE — Progress Notes (Signed)
Clinical Summary Gina [name] is a 76 y.o.female  seen today for follow up of the following medical problems.   1. Chronic systolic HF - 40/8144 echo LVEF 81-85%, grade I diastolic dysfunction. Multiple WMAs.  - echo 10/2010 LVEF 40-45%, multiple WMAs.  - cath 2009 patent coronaries -echo 03/2015 LVEF 20-25% - echo 07/2015 LVEF 20-25% - from records appears she had severe LV systolic dysfunction several years ago (around 2009) that improved, but most recently has decreased again. - cath 08/2015 with patent coronaries - remains hesitant about ICD.     - no recent SOB/DOE. No recent edema - compliant with meds.  - she reports gradual weight gain, poor eating habits. No significant edema   2. HTN -  we stopped her norvasc due to low bp's. BP's and symptoms resolved.  - compliant with meds   3. OSA - she is compliant CPAP  4. Abnormal eye exam - opthalmologist noted mild bleed per her report, pattern suggesting possibly DM2 or carotid disease - she has no known prior history of carotid disease, no bruits on exam  Past Medical History:  Diagnosis Date  . Acute renal failure [facility])     hospitalized... December 08, 2009... improved with hydration in the hospital  . Anemia    further workup needed... October, 2011.  [name] Aortic insufficiency    mild...echo... June 2 010  . Arthritis    Hips/Knees, severe, requiring a walker  . Asymmetric septal hypertrophy (HCC)    echo....04/2009....patient encouraged the family to be screened elsewhere  . Atrial fibrillation ([place])     ??? atrial fibrillation during hospitalization October, 2011 ???..  . Bradycardia   . CAD (coronary artery disease)    Mild coronary disease, catheterization, 2009  . Cardiomyopathy, nonischemic ([place])    EF improved October, 2011  . Ejection fraction    EF 25%...nondiagnostic MRI December 2009...  /  echo June, 2010  /   echo... December 08, 2009.... ejection fraction improved... EF 60% /  EF 30%...  echo.... [place]... December 28, 2009 with CHFPLanned ICD / CRT... patient has seen Dr.Klein..EF improved October, 2011   . Groin hematoma    Right-hematoma/abscess..repaired.. December, 2009  . Hypertension   . LBBB (left bundle Lyris Hitchman block)   . LVH (left ventricular hypertrophy)    moderately severe... echo.. June, 2010  /  EF improved October, 2011.... cancel plans for ICD  . Mitral regurgitation    mild/ moderate...echo June, 2010  /   echo.. October, 2011.... mitral regurgitation  improved  . OSA (obstructive sleep apnea)   . Renal artery stenosis (HCC)    80% upper left   . Systolic heart failure    chronic     Allergies  Allergen Reactions  . Cephalexin     REACTION: Rash to arms/legs     Current Outpatient Prescriptions  Medication Sig Dispense Refill  . aspirin 81 MG tablet Take 81 mg by mouth daily.      . Calcium Carbonate-Vitamin D (CALCIUM 600/VITAMIN D) 600-400 MG-UNIT per tablet Take 2 tablets by mouth daily.     . carvedilol (COREG) 6.25 MG tablet TAKE 1 TABLET BY MOUTH TWICE DAILY 60 tablet 0  . ENTRESTO 49-51 MG TAKE 1 TABLET BY MOUTH TWICE DAILY 60 tablet 6  . ferrous sulfate 325 (65 FE) MG tablet Take 325 mg by mouth daily.      . furosemide (LASIX) 20 MG tablet TAKE 1 TAB EVERY OTHER DAY ALTERNATING WITH  40 MG 45 tablet 3  . furosemide (LASIX) 40 MG tablet Take 40 mg by mouth daily.    . hydrALAZINE (APRESOLINE) 50 MG tablet TAKE 1 TABLET BY MOUTH THREE TIMES DAILY 90 tablet 0  . isosorbide mononitrate (IMDUR) 60 MG 24 hr tablet TAKE 1 TABLET BY MOUTH DAILY 30 tablet 0  . Multiple Vitamin (MULTIVITAMIN) tablet Take 1 tablet by mouth daily.    . Multiple Vitamins-Minerals (ICAPS PO) Take 1 capsule by mouth daily.    . potassium chloride SA (K-DUR,KLOR-CON) 20 MEQ tablet Take 1 tablet (20 mEq total) by mouth 2 (two) times daily. 60 tablet 6  . sacubitril-valsartan (ENTRESTO) 49-51 MG Take 1 tablet by mouth 2 (two) times daily. 42 tablet 0  .  spironolactone (ALDACTONE) 25 MG tablet TAKE 1 TABLET BY MOUTH DAILY 30 tablet 0   No current facility-administered medications for this visit.      Past Surgical History:  Procedure Laterality Date  . ABDOMINAL HYSTERECTOMY    . CARDIAC CATHETERIZATION N/A 08/14/2015   Procedure: Right/Left Heart Cath and Coronary Angiography;  Surgeon: Leonie Man, MD;  Location: [place] [facility];  Service: Cardiovascular;  Laterality: N/A;  . Surgical Repair of a Catheteriztion associated femoral artery injury       Allergies  Allergen Reactions  . Cephalexin     REACTION: Rash to arms/legs      Family History  Problem Relation Age of Onset  . Rheumatic fever Mother        in her early 43's  . Alzheimer's disease Mother   . Stomach cancer Father 65       died 27  . Bradycardia Brother        has pacemaker  . Heart failure Paternal Grandmother   . Heart failure Paternal Grandfather      Social History Gina [name] reports that she quit smoking about 30 years ago. Her smoking use included Cigarettes. She started smoking about 57 years ago. She has a 24.00 pack-year smoking history. She has never used smokeless tobacco. Gina [name] reports that she does not drink alcohol.   Review of Systems CONSTITUTIONAL: No weight loss, fever, chills, weakness or fatigue.  HEENT: Eyes: No visual loss, blurred vision, double vision or yellow sclerae.No hearing loss, sneezing, congestion, runny nose or sore throat.  SKIN: No rash or itching.  CARDIOVASCULAR: per hpi RESPIRATORY: No shortness of breath, cough or sputum.  GASTROINTESTINAL: No anorexia, nausea, vomiting or diarrhea. No abdominal pain or blood.  GENITOURINARY: No burning on urination, no polyuria NEUROLOGICAL: No headache, dizziness, syncope, paralysis, ataxia, numbness or tingling in the extremities. No change in bowel or bladder control.  MUSCULOSKELETAL: No muscle, back pain, joint pain or stiffness.  LYMPHATICS: No enlarged  nodes. No history of splenectomy.  PSYCHIATRIC: No history of depression or anxiety.  ENDOCRINOLOGIC: No reports of sweating, cold or heat intolerance. No polyuria or polydipsia.  [name]   Physical Examination Vitals:   08/20/16 1623  BP: (!) 115/56  Pulse: (!) 49   Vitals:   08/20/16 1623  Weight: 223 lb (101.2 kg)  Height: 5\' 4"  (1.626 m)    Gen: resting comfortably, no acute distress HEENT: no scleral icterus, pupils equal round and reactive, no palptable cervical adenopathy,  CV: RRR, no m/r/g, no jvd Resp: Clear to auscultation bilaterally GI: abdomen is soft, non-tender, non-distended, normal bowel sounds, no hepatosplenomegaly MSK: extremities are warm, no edema.  Skin: warm, no rash Neuro:  no focal deficits Psych:  appropriate affect   Diagnostic Studies 02/2008 cath FINDINGS: Hemodynamics: Mean right atrial pressure was 13 mmHg. RV 43/9, PA 47/28 with mean PA pressure of 37 mmHg. Mean pulmonary capillary wedge pressure of 23 mmHg. Aorta 181/90. Left ventricle 177/17/26. Mixed venous O2 saturation was 65%, aortic saturation 92%, SVR was 1355, cardiac output was 6.55 liters per minute. Cardiac index was 3.45. Left ventriculography: Left ventricle was enlarged. There was global severe hypokinesis with an estimated ejection fraction of 25%. Coronary angiography: The right coronary artery was dominant and showed only luminal irregularities. Left main coronary artery was free from significant disease. The circumflex was a large vessel, there were 3 obtuse marginal vessels with a 30% ostial stenosis of the first obtuse marginal. There was a 30% stenosis in the proximal circumflex between the first and second obtuse marginals. The LAD system actually was a dual LAD type system, with a large diagonal that reached the apex. There is a mild 20% stenosis, just prior to the bifurcation of the large first diagonal off the LAD. The remainder of the  LAD did not have significant disease. There were luminal irregularities in the large first diagonal. This vessel reached the apex. Non-selective abdominal aortography did show 2 right and 2 left renal arteries, there appeared to be an 80% ostial stenosis of the upper left renal artery. This was confirmed by a selective renal angiography.   02/2014 echo Study Conclusions  - Left ventricle: The cavity size was normal. Wall thickness was increased in a pattern of moderate LVH. Sigmoid shaped basal septum. Systolic function was severely reduced. The estimated ejection fraction was in the range of 25% to 30%. Diffuse hypokinesis. There is severe hypokinesis of the mid-apicalanteroseptal, anterior, and apical myocardium. Doppler parameters are consistent with abnormal left ventricular relaxation (grade 1 diastolic dysfunction). - Ventricular septum: Septal motion showed abnormal function and dyssynergy. - Aortic valve: Mildly calcified annulus. Trileaflet. There was trivial regurgitation. - Ascending aorta: The ascending aorta was mildly dilated, 43 mm. - Mitral valve: Calcified annulus. There was trivial regurgitation. - Left atrium: The atrium was severely dilated. - Right atrium: Central venous pressure (est): 3 mm Hg. - Atrial septum: No defect or patent foramen ovale was identified. - Tricuspid valve: There was trivial regurgitation. - Pulmonary arteries: Systolic pressure could not be accurately estimated. - Pericardium, extracardiac: There was no pericardial effusion.  Impressions:  - Limited images. Unable to compare with prior study. Moderate LVH with sigmoid shaped basal septum, LVEF approximately 25-30% with diffuse hypokinesis, most severe mid to apical anteroseptal and anterior wall. Septal dyssynegry. Grade 1 diastolic dysfunction. Severe left atrial enlargement. MIldly dilated ascending aorta. Trivial aortic regurgitation.  Trivial tricuspid regurgitation, unable to assess PASP.  Jan 2017 echo Study Conclusions  - Left ventricle: The cavity size was normal. Wall thickness was increased in a pattern of severe LVH. Systolic function was severely reduced. The estimated ejection fraction was in the range of 20% to 25%. Diffuse hypokinesis. Doppler parameters are consistent with abnormal left ventricular relaxation (grade 1 diastolic dysfunction). - Aortic valve: Mildly calcified annulus. Mildly thickened leaflets. Valve area (VTI): 2.43 cm^2. Valve area (Vmax): 2.39 cm^2. Valve area (Vmean): 2.28 cm^2. - Mitral valve: Mildly calcified annulus. Mildly thickened leaflets . There was mild regurgitation. - Left atrium: The atrium was moderately dilated. - Technically adequate study.  08/2015 Cath 1. Angiographically normal coronary arteries 2. Nonischemic cardiomyopathy - conflicting cardiac outputs between Fick and thermodilution. Suspect that there is some valvular disease that is throwing  off the thermodilution. 3. Essentially normal right heart pressures. Mildly elevated LVEDP and PCWP, but otherwise normal pressures.   Clearly nonischemic cardiomyopathy. The patient's itching anatomy with a tandem LAD system and codominant.  Plan:  Discharge after bedrest following femoral sheath removal.  Continue current management. She appears euvolemic      Assessment and Plan  1. Chronic systolic heart failure - NICM, LVEF 20-25%, she is NYHA III. She remains not interested in ICD, if reconsiders consider CRT ICD given her LBBB - no symptoms, continue current meds. EKG in clniic today shows SR with PACs, chronic LBBB    2. HTN - at goal, continue current meds  3. OSA - continue CPAP  4. Abnormal eye exam - has f/u with eye doctor coming up, apparently last exam brought up some possible question of carotid disease. If needed we can obtain a carotid US if progressing eye  changes. She has never had a carotid bruit on exam    F/u 4 months      Arnoldo Lenis, M.D.

## 2016-08-22 DIAGNOSIS — I5022 Chronic systolic (congestive) heart failure: Secondary | ICD-10-CM | POA: Diagnosis not present

## 2016-08-29 ENCOUNTER — Telehealth: Payer: Self-pay | Admitting: *Deleted

## 2016-08-29 NOTE — Telephone Encounter (Signed)
-----   Message from Arnoldo Lenis, MD sent at 08/28/2016  1:43 PM EDT ----- Labs are stable, kidney function remains decreased but overall stable  J BrancH MD

## 2016-08-29 NOTE — Telephone Encounter (Signed)
Pt aware - routed to pcp  

## 2016-09-02 ENCOUNTER — Other Ambulatory Visit: Payer: Self-pay | Admitting: *Deleted

## 2016-09-02 MED ORDER — HYDRALAZINE HCL 50 MG PO TABS: 50.0000 mg | ORAL_TABLET | Freq: Three times a day (TID) | ORAL | 6 refills | Status: DC

## 2016-09-02 MED ORDER — CARVEDILOL 6.25 MG PO TABS: 6.2500 mg | ORAL_TABLET | Freq: Two times a day (BID) | ORAL | 6 refills | Status: DC

## 2016-09-02 MED ORDER — SPIRONOLACTONE 25 MG PO TABS: 25.0000 mg | ORAL_TABLET | Freq: Every day | ORAL | 6 refills | Status: DC

## 2016-09-02 MED ORDER — ISOSORBIDE MONONITRATE ER 60 MG PO TB24: 60.0000 mg | ORAL_TABLET | Freq: Every day | ORAL | 6 refills | Status: DC

## 2016-09-10 ENCOUNTER — Other Ambulatory Visit: Payer: Self-pay | Admitting: Cardiology

## 2016-09-25 DIAGNOSIS — N170 Acute kidney failure with tubular necrosis: Secondary | ICD-10-CM | POA: Diagnosis not present

## 2016-09-25 DIAGNOSIS — B961 Klebsiella pneumoniae [K. pneumoniae] as the cause of diseases classified elsewhere: Secondary | ICD-10-CM | POA: Diagnosis present

## 2016-09-25 DIAGNOSIS — R197 Diarrhea, unspecified: Secondary | ICD-10-CM | POA: Diagnosis not present

## 2016-09-25 DIAGNOSIS — Z881 Allergy status to other antibiotic agents status: Secondary | ICD-10-CM | POA: Diagnosis not present

## 2016-09-25 DIAGNOSIS — N179 Acute kidney failure, unspecified: Secondary | ICD-10-CM | POA: Diagnosis not present

## 2016-09-25 DIAGNOSIS — I509 Heart failure, unspecified: Secondary | ICD-10-CM | POA: Diagnosis not present

## 2016-09-25 DIAGNOSIS — E669 Obesity, unspecified: Secondary | ICD-10-CM | POA: Diagnosis present

## 2016-09-25 DIAGNOSIS — I5022 Chronic systolic (congestive) heart failure: Secondary | ICD-10-CM | POA: Diagnosis present

## 2016-09-25 DIAGNOSIS — R0602 Shortness of breath: Secondary | ICD-10-CM | POA: Diagnosis not present

## 2016-09-25 DIAGNOSIS — R531 Weakness: Secondary | ICD-10-CM | POA: Diagnosis not present

## 2016-09-25 DIAGNOSIS — R404 Transient alteration of awareness: Secondary | ICD-10-CM | POA: Diagnosis not present

## 2016-09-25 DIAGNOSIS — N183 Chronic kidney disease, stage 3 (moderate): Secondary | ICD-10-CM | POA: Diagnosis present

## 2016-09-25 DIAGNOSIS — N289 Disorder of kidney and ureter, unspecified: Secondary | ICD-10-CM | POA: Diagnosis not present

## 2016-09-25 DIAGNOSIS — Z6838 Body mass index (BMI) 38.0-38.9, adult: Secondary | ICD-10-CM | POA: Diagnosis not present

## 2016-09-25 DIAGNOSIS — K529 Noninfective gastroenteritis and colitis, unspecified: Secondary | ICD-10-CM | POA: Diagnosis present

## 2016-09-25 DIAGNOSIS — Z90710 Acquired absence of both cervix and uterus: Secondary | ICD-10-CM | POA: Diagnosis not present

## 2016-09-25 DIAGNOSIS — R109 Unspecified abdominal pain: Secondary | ICD-10-CM | POA: Diagnosis not present

## 2016-09-25 DIAGNOSIS — D649 Anemia, unspecified: Secondary | ICD-10-CM | POA: Diagnosis not present

## 2016-09-25 DIAGNOSIS — I130 Hypertensive heart and chronic kidney disease with heart failure and stage 1 through stage 4 chronic kidney disease, or unspecified chronic kidney disease: Secondary | ICD-10-CM | POA: Diagnosis not present

## 2016-09-25 DIAGNOSIS — E860 Dehydration: Secondary | ICD-10-CM | POA: Diagnosis not present

## 2016-09-25 DIAGNOSIS — N390 Urinary tract infection, site not specified: Secondary | ICD-10-CM | POA: Diagnosis present

## 2016-09-25 DIAGNOSIS — N184 Chronic kidney disease, stage 4 (severe): Secondary | ICD-10-CM | POA: Diagnosis not present

## 2016-09-25 DIAGNOSIS — E872 Acidosis: Secondary | ICD-10-CM | POA: Diagnosis present

## 2016-09-25 DIAGNOSIS — D631 Anemia in chronic kidney disease: Secondary | ICD-10-CM | POA: Diagnosis present

## 2016-09-25 DIAGNOSIS — E875 Hyperkalemia: Secondary | ICD-10-CM | POA: Diagnosis not present

## 2016-09-25 DIAGNOSIS — Z9889 Other specified postprocedural states: Secondary | ICD-10-CM | POA: Diagnosis not present

## 2016-09-29 DIAGNOSIS — I5023 Acute on chronic systolic (congestive) heart failure: Secondary | ICD-10-CM | POA: Diagnosis not present

## 2016-09-29 DIAGNOSIS — K529 Noninfective gastroenteritis and colitis, unspecified: Secondary | ICD-10-CM | POA: Diagnosis not present

## 2016-09-29 DIAGNOSIS — K5732 Diverticulitis of large intestine without perforation or abscess without bleeding: Secondary | ICD-10-CM | POA: Diagnosis not present

## 2016-09-29 DIAGNOSIS — B961 Klebsiella pneumoniae [K. pneumoniae] as the cause of diseases classified elsewhere: Secondary | ICD-10-CM | POA: Diagnosis not present

## 2016-09-29 DIAGNOSIS — I130 Hypertensive heart and chronic kidney disease with heart failure and stage 1 through stage 4 chronic kidney disease, or unspecified chronic kidney disease: Secondary | ICD-10-CM | POA: Diagnosis not present

## 2016-09-29 DIAGNOSIS — N390 Urinary tract infection, site not specified: Secondary | ICD-10-CM | POA: Diagnosis not present

## 2016-10-01 DIAGNOSIS — N184 Chronic kidney disease, stage 4 (severe): Secondary | ICD-10-CM | POA: Diagnosis not present

## 2016-10-01 DIAGNOSIS — R809 Proteinuria, unspecified: Secondary | ICD-10-CM | POA: Diagnosis not present

## 2016-10-02 DIAGNOSIS — I10 Essential (primary) hypertension: Secondary | ICD-10-CM | POA: Diagnosis not present

## 2016-10-02 DIAGNOSIS — G4730 Sleep apnea, unspecified: Secondary | ICD-10-CM | POA: Diagnosis not present

## 2016-10-02 DIAGNOSIS — E7800 Pure hypercholesterolemia, unspecified: Secondary | ICD-10-CM | POA: Diagnosis not present

## 2016-10-02 DIAGNOSIS — Z299 Encounter for prophylactic measures, unspecified: Secondary | ICD-10-CM | POA: Diagnosis not present

## 2016-10-02 DIAGNOSIS — I509 Heart failure, unspecified: Secondary | ICD-10-CM | POA: Diagnosis not present

## 2016-10-02 DIAGNOSIS — G4733 Obstructive sleep apnea (adult) (pediatric): Secondary | ICD-10-CM | POA: Diagnosis not present

## 2016-10-02 DIAGNOSIS — Z6837 Body mass index (BMI) 37.0-37.9, adult: Secondary | ICD-10-CM | POA: Diagnosis not present

## 2016-10-02 DIAGNOSIS — Z713 Dietary counseling and surveillance: Secondary | ICD-10-CM | POA: Diagnosis not present

## 2016-10-02 DIAGNOSIS — I429 Cardiomyopathy, unspecified: Secondary | ICD-10-CM | POA: Diagnosis not present

## 2016-10-03 DIAGNOSIS — K5732 Diverticulitis of large intestine without perforation or abscess without bleeding: Secondary | ICD-10-CM | POA: Diagnosis not present

## 2016-10-03 DIAGNOSIS — I5023 Acute on chronic systolic (congestive) heart failure: Secondary | ICD-10-CM | POA: Diagnosis not present

## 2016-10-03 DIAGNOSIS — N390 Urinary tract infection, site not specified: Secondary | ICD-10-CM | POA: Diagnosis not present

## 2016-10-03 DIAGNOSIS — I130 Hypertensive heart and chronic kidney disease with heart failure and stage 1 through stage 4 chronic kidney disease, or unspecified chronic kidney disease: Secondary | ICD-10-CM | POA: Diagnosis not present

## 2016-10-03 DIAGNOSIS — B961 Klebsiella pneumoniae [K. pneumoniae] as the cause of diseases classified elsewhere: Secondary | ICD-10-CM | POA: Diagnosis not present

## 2016-10-03 DIAGNOSIS — K529 Noninfective gastroenteritis and colitis, unspecified: Secondary | ICD-10-CM | POA: Diagnosis not present

## 2016-10-08 DIAGNOSIS — H2513 Age-related nuclear cataract, bilateral: Secondary | ICD-10-CM | POA: Diagnosis not present

## 2016-10-09 DIAGNOSIS — B961 Klebsiella pneumoniae [K. pneumoniae] as the cause of diseases classified elsewhere: Secondary | ICD-10-CM | POA: Diagnosis not present

## 2016-10-09 DIAGNOSIS — K529 Noninfective gastroenteritis and colitis, unspecified: Secondary | ICD-10-CM | POA: Diagnosis not present

## 2016-10-09 DIAGNOSIS — K5732 Diverticulitis of large intestine without perforation or abscess without bleeding: Secondary | ICD-10-CM | POA: Diagnosis not present

## 2016-10-09 DIAGNOSIS — I5023 Acute on chronic systolic (congestive) heart failure: Secondary | ICD-10-CM | POA: Diagnosis not present

## 2016-10-09 DIAGNOSIS — I130 Hypertensive heart and chronic kidney disease with heart failure and stage 1 through stage 4 chronic kidney disease, or unspecified chronic kidney disease: Secondary | ICD-10-CM | POA: Diagnosis not present

## 2016-10-09 DIAGNOSIS — N390 Urinary tract infection, site not specified: Secondary | ICD-10-CM | POA: Diagnosis not present

## 2016-10-11 DIAGNOSIS — N390 Urinary tract infection, site not specified: Secondary | ICD-10-CM | POA: Diagnosis not present

## 2016-10-11 DIAGNOSIS — B961 Klebsiella pneumoniae [K. pneumoniae] as the cause of diseases classified elsewhere: Secondary | ICD-10-CM | POA: Diagnosis not present

## 2016-10-11 DIAGNOSIS — I5023 Acute on chronic systolic (congestive) heart failure: Secondary | ICD-10-CM | POA: Diagnosis not present

## 2016-10-11 DIAGNOSIS — I130 Hypertensive heart and chronic kidney disease with heart failure and stage 1 through stage 4 chronic kidney disease, or unspecified chronic kidney disease: Secondary | ICD-10-CM | POA: Diagnosis not present

## 2016-10-11 DIAGNOSIS — K529 Noninfective gastroenteritis and colitis, unspecified: Secondary | ICD-10-CM | POA: Diagnosis not present

## 2016-10-11 DIAGNOSIS — K5732 Diverticulitis of large intestine without perforation or abscess without bleeding: Secondary | ICD-10-CM | POA: Diagnosis not present

## 2016-10-14 DIAGNOSIS — N184 Chronic kidney disease, stage 4 (severe): Secondary | ICD-10-CM | POA: Diagnosis not present

## 2016-10-14 DIAGNOSIS — N250 Renal osteodystrophy: Secondary | ICD-10-CM | POA: Diagnosis not present

## 2016-10-14 DIAGNOSIS — N179 Acute kidney failure, unspecified: Secondary | ICD-10-CM | POA: Diagnosis not present

## 2016-10-14 DIAGNOSIS — I10 Essential (primary) hypertension: Secondary | ICD-10-CM | POA: Diagnosis not present

## 2016-10-14 DIAGNOSIS — D631 Anemia in chronic kidney disease: Secondary | ICD-10-CM | POA: Diagnosis not present

## 2016-10-14 DIAGNOSIS — I509 Heart failure, unspecified: Secondary | ICD-10-CM | POA: Diagnosis not present

## 2016-10-15 DIAGNOSIS — N390 Urinary tract infection, site not specified: Secondary | ICD-10-CM | POA: Diagnosis not present

## 2016-10-15 DIAGNOSIS — I130 Hypertensive heart and chronic kidney disease with heart failure and stage 1 through stage 4 chronic kidney disease, or unspecified chronic kidney disease: Secondary | ICD-10-CM | POA: Diagnosis not present

## 2016-10-15 DIAGNOSIS — K529 Noninfective gastroenteritis and colitis, unspecified: Secondary | ICD-10-CM | POA: Diagnosis not present

## 2016-10-15 DIAGNOSIS — K5732 Diverticulitis of large intestine without perforation or abscess without bleeding: Secondary | ICD-10-CM | POA: Diagnosis not present

## 2016-10-15 DIAGNOSIS — B961 Klebsiella pneumoniae [K. pneumoniae] as the cause of diseases classified elsewhere: Secondary | ICD-10-CM | POA: Diagnosis not present

## 2016-10-15 DIAGNOSIS — I5023 Acute on chronic systolic (congestive) heart failure: Secondary | ICD-10-CM | POA: Diagnosis not present

## 2016-10-23 DIAGNOSIS — K5732 Diverticulitis of large intestine without perforation or abscess without bleeding: Secondary | ICD-10-CM | POA: Diagnosis not present

## 2016-10-23 DIAGNOSIS — N390 Urinary tract infection, site not specified: Secondary | ICD-10-CM | POA: Diagnosis not present

## 2016-10-23 DIAGNOSIS — B961 Klebsiella pneumoniae [K. pneumoniae] as the cause of diseases classified elsewhere: Secondary | ICD-10-CM | POA: Diagnosis not present

## 2016-10-23 DIAGNOSIS — I5023 Acute on chronic systolic (congestive) heart failure: Secondary | ICD-10-CM | POA: Diagnosis not present

## 2016-10-23 DIAGNOSIS — K529 Noninfective gastroenteritis and colitis, unspecified: Secondary | ICD-10-CM | POA: Diagnosis not present

## 2016-10-23 DIAGNOSIS — I130 Hypertensive heart and chronic kidney disease with heart failure and stage 1 through stage 4 chronic kidney disease, or unspecified chronic kidney disease: Secondary | ICD-10-CM | POA: Diagnosis not present

## 2016-10-28 DIAGNOSIS — R5383 Other fatigue: Secondary | ICD-10-CM | POA: Diagnosis not present

## 2016-10-28 DIAGNOSIS — Z299 Encounter for prophylactic measures, unspecified: Secondary | ICD-10-CM | POA: Diagnosis not present

## 2016-10-28 DIAGNOSIS — Z6836 Body mass index (BMI) 36.0-36.9, adult: Secondary | ICD-10-CM | POA: Diagnosis not present

## 2016-10-28 DIAGNOSIS — E7800 Pure hypercholesterolemia, unspecified: Secondary | ICD-10-CM | POA: Diagnosis not present

## 2016-10-28 DIAGNOSIS — I10 Essential (primary) hypertension: Secondary | ICD-10-CM | POA: Diagnosis not present

## 2016-10-28 DIAGNOSIS — I509 Heart failure, unspecified: Secondary | ICD-10-CM | POA: Diagnosis not present

## 2016-10-28 DIAGNOSIS — Z7189 Other specified counseling: Secondary | ICD-10-CM | POA: Diagnosis not present

## 2016-10-28 DIAGNOSIS — G4733 Obstructive sleep apnea (adult) (pediatric): Secondary | ICD-10-CM | POA: Diagnosis not present

## 2016-10-28 DIAGNOSIS — I429 Cardiomyopathy, unspecified: Secondary | ICD-10-CM | POA: Diagnosis not present

## 2016-10-28 DIAGNOSIS — Z79899 Other long term (current) drug therapy: Secondary | ICD-10-CM | POA: Diagnosis not present

## 2016-10-28 DIAGNOSIS — Z1211 Encounter for screening for malignant neoplasm of colon: Secondary | ICD-10-CM | POA: Diagnosis not present

## 2016-10-28 DIAGNOSIS — Z1389 Encounter for screening for other disorder: Secondary | ICD-10-CM | POA: Diagnosis not present

## 2016-10-28 DIAGNOSIS — Z0000 Encounter for general adult medical examination without abnormal findings: Secondary | ICD-10-CM | POA: Diagnosis not present

## 2016-11-07 DIAGNOSIS — I10 Essential (primary) hypertension: Secondary | ICD-10-CM | POA: Diagnosis not present

## 2016-11-07 DIAGNOSIS — M159 Polyosteoarthritis, unspecified: Secondary | ICD-10-CM | POA: Diagnosis not present

## 2016-11-07 DIAGNOSIS — I509 Heart failure, unspecified: Secondary | ICD-10-CM | POA: Diagnosis not present

## 2016-11-08 DIAGNOSIS — I5023 Acute on chronic systolic (congestive) heart failure: Secondary | ICD-10-CM | POA: Diagnosis not present

## 2016-11-08 DIAGNOSIS — B961 Klebsiella pneumoniae [K. pneumoniae] as the cause of diseases classified elsewhere: Secondary | ICD-10-CM | POA: Diagnosis not present

## 2016-11-08 DIAGNOSIS — I130 Hypertensive heart and chronic kidney disease with heart failure and stage 1 through stage 4 chronic kidney disease, or unspecified chronic kidney disease: Secondary | ICD-10-CM | POA: Diagnosis not present

## 2016-11-08 DIAGNOSIS — N390 Urinary tract infection, site not specified: Secondary | ICD-10-CM | POA: Diagnosis not present

## 2016-11-08 DIAGNOSIS — K5732 Diverticulitis of large intestine without perforation or abscess without bleeding: Secondary | ICD-10-CM | POA: Diagnosis not present

## 2016-11-08 DIAGNOSIS — K529 Noninfective gastroenteritis and colitis, unspecified: Secondary | ICD-10-CM | POA: Diagnosis not present

## 2016-12-13 DIAGNOSIS — I10 Essential (primary) hypertension: Secondary | ICD-10-CM | POA: Diagnosis not present

## 2016-12-13 DIAGNOSIS — I509 Heart failure, unspecified: Secondary | ICD-10-CM | POA: Diagnosis not present

## 2016-12-13 DIAGNOSIS — M159 Polyosteoarthritis, unspecified: Secondary | ICD-10-CM | POA: Diagnosis not present

## 2016-12-24 ENCOUNTER — Encounter: Payer: Self-pay | Admitting: *Deleted

## 2016-12-24 ENCOUNTER — Encounter: Payer: Self-pay | Admitting: Cardiology

## 2016-12-24 ENCOUNTER — Ambulatory Visit (INDEPENDENT_AMBULATORY_CARE_PROVIDER_SITE_OTHER): Payer: Medicare Other | Admitting: Cardiology

## 2016-12-24 VITALS — BP 128/82 | HR 68 | Ht 64.0 in | Wt 221.2 lb

## 2016-12-24 DIAGNOSIS — Z23 Encounter for immunization: Secondary | ICD-10-CM | POA: Diagnosis not present

## 2016-12-24 DIAGNOSIS — I10 Essential (primary) hypertension: Secondary | ICD-10-CM

## 2016-12-24 DIAGNOSIS — I5022 Chronic systolic (congestive) heart failure: Secondary | ICD-10-CM

## 2016-12-24 DIAGNOSIS — N179 Acute kidney failure, unspecified: Secondary | ICD-10-CM | POA: Diagnosis not present

## 2016-12-24 NOTE — Progress Notes (Signed)
Clinical Summary Ms. Mensch is a 76 y.o.female seen today for follow up of the following medical problems.   1. Chronic systolic HF - 85/0277 echo LVEF 41-28%, grade I diastolic dysfunction. Multiple WMAs.  - echo 10/2010 LVEF 40-45%, multiple WMAs.  - cath 2009 patent coronaries -echo 03/2015 LVEF 20-25% - echo 07/2015 LVEF 20-25% - from records appears she had severe LV systolic dysfunction several years ago (around 2009) that improved, but most recently has decreased again. - cath 08/2015 with patent coronaries - remains hesitant about ICD.    - No recent edema. No recent SOB/DOE  2. HTN -  we stopped her norvasc due to low bp's. BP's and symptoms resolved.  - she remains compliant with home meds   3. OSA - she is compliant CPAP   4. AKI Admit July with diarrhea and dehydration, hyperkalemia - entresto was held, potassium was stopped - followed with nephrology in August. Had repeat blood work at that time.  - she has restarted lasix 20mg  and alternating 40mg . Restarted on entresto 49/51 bid. Still off potassium   5. Obesity - rejoined weight watchers.    Past Medical History:  Diagnosis Date  . Acute renal failure [facility])     hospitalized... December 08, 2009... improved with hydration in the hospital  . Anemia    further workup needed... October, 2011.  [name] Aortic insufficiency    mild...echo... June 2 010  . Arthritis    Hips/Knees, severe, requiring a walker  . Asymmetric septal hypertrophy (HCC)    echo....04/2009....patient encouraged the family to be screened elsewhere  . Atrial fibrillation ([place])     ??? atrial fibrillation during hospitalization October, 2011 ???..  . Bradycardia   . CAD (coronary artery disease)    Mild coronary disease, catheterization, 2009  . Cardiomyopathy, nonischemic ([place])    EF improved October, 2011  . Ejection fraction    EF 25%...nondiagnostic MRI December 2009...  /  echo June, 2010  /   echo... December 08, 2009....  ejection fraction improved... EF 60% /  EF 30%... echo.... [facility]... December 28, 2009 with CHFPLanned ICD / CRT... patient has seen Dr.Klein..EF improved October, 2011   . Groin hematoma    Right-hematoma/abscess..repaired.. December, 2009  . Hypertension   . LBBB (left bundle Trichelle Lehan block)   . LVH (left ventricular hypertrophy)    moderately severe... echo.. June, 2010  /  EF improved October, 2011.... cancel plans for ICD  . Mitral regurgitation    mild/ moderate...echo June, 2010  /   echo.. October, 2011.... mitral regurgitation  improved  . OSA (obstructive sleep apnea)   . Renal artery stenosis (HCC)    80% upper left   . Systolic heart failure    chronic     Allergies  Allergen Reactions  . Cephalexin     REACTION: Rash to arms/legs     Current Outpatient Prescriptions  Medication Sig Dispense Refill  . aspirin 81 MG tablet Take 81 mg by mouth daily.      . calcitRIOL (ROCALTROL) 0.25 MCG capsule Take 0.25 mcg by mouth daily.    . carvedilol (COREG) 6.25 MG tablet Take 1 tablet (6.25 mg total) by mouth 2 (two) times daily. 60 tablet 6  . docusate sodium (COLACE) 100 MG capsule Take 100 mg by mouth daily.    . ferrous sulfate 325 (65 FE) MG tablet Take 325 mg by mouth daily.      . furosemide (LASIX) 20 MG tablet  TAKE 1 TABLET BY MOUTH EVERY OTHER DAY ALTERNATING WITH 40 MG 45 tablet 1  . furosemide (LASIX) 40 MG tablet Take 40 mg by mouth every other day. Alternating with the 20mg     . hydrALAZINE (APRESOLINE) 50 MG tablet Take 1 tablet (50 mg total) by mouth 3 (three) times daily. 90 tablet 6  . isosorbide mononitrate (IMDUR) 60 MG 24 hr tablet Take 1 tablet (60 mg total) by mouth daily. 30 tablet 6  . Multiple Vitamin (MULTIVITAMIN) tablet Take 1 tablet by mouth daily.    . Multiple Vitamins-Minerals (ICAPS PO) Take 1 capsule by mouth daily.    . sacubitril-valsartan (ENTRESTO) 49-51 MG Take 1 tablet by mouth 2 (two) times daily. 42 tablet 0  . spironolactone  (ALDACTONE) 25 MG tablet Take 1 tablet (25 mg total) by mouth daily. 30 tablet 6   No current facility-administered medications for this visit.      Past Surgical History:  Procedure Laterality Date  . ABDOMINAL HYSTERECTOMY    . CARDIAC CATHETERIZATION N/A 08/14/2015   Procedure: Right/Left Heart Cath and Coronary Angiography;  Surgeon: Leonie Man, MD;  Location: [place] [facility];  Service: Cardiovascular;  Laterality: N/A;  . Surgical Repair of a Catheteriztion associated femoral artery injury       Allergies  Allergen Reactions  . Cephalexin     REACTION: Rash to arms/legs      Family History  Problem Relation Age of Onset  . Rheumatic fever Mother        in her early 80's  . Alzheimer's disease Mother   . Stomach cancer Father 31       died 19  . Bradycardia Brother        has pacemaker  . Heart failure Paternal Grandmother   . Heart failure Paternal Grandfather      Social History Ms. Hasley reports that she quit smoking about 31 years ago. Her smoking use included Cigarettes. She started smoking about 58 years ago. She has a 24.00 pack-year smoking history. She has never used smokeless tobacco. Ms. Lacasse reports that she does not drink alcohol.   Review of Systems CONSTITUTIONAL: No weight loss, fever, chills, weakness or fatigue.  HEENT: Eyes: No visual loss, blurred vision, double vision or yellow sclerae.No hearing loss, sneezing, congestion, runny nose or sore throat.  SKIN: No rash or itching.  CARDIOVASCULAR: per hpi RESPIRATORY: No shortness of breath, cough or sputum.  GASTROINTESTINAL: No anorexia, nausea, vomiting or diarrhea. No abdominal pain or blood.  GENITOURINARY: No burning on urination, no polyuria NEUROLOGICAL: No headache, dizziness, syncope, paralysis, ataxia, numbness or tingling in the extremities. No change in bowel or bladder control.  MUSCULOSKELETAL: No muscle, back pain, joint pain or stiffness.  LYMPHATICS: No enlarged  nodes. No history of splenectomy.  PSYCHIATRIC: No history of depression or anxiety.  ENDOCRINOLOGIC: No reports of sweating, cold or heat intolerance. No polyuria or polydipsia.  [name]   Physical Examination Vitals:   12/24/16 1617  BP: 128/82  Pulse: 68  SpO2: 97%   Filed Weights   12/24/16 1617  Weight: 221 lb 3.2 oz (100.3 kg)    Gen: resting comfortably, no acute distress HEENT: no scleral icterus, pupils equal round and reactive, no palptable cervical adenopathy,  CV: RRR, no m/r/g, no jvd Resp: Clear to auscultation bilaterally GI: abdomen is soft, non-tender, non-distended, normal bowel sounds, no hepatosplenomegaly MSK: extremities are warm, no edema.  Skin: warm, no rash Neuro:  no focal deficits Psych:  appropriate affect   Diagnostic Studies 02/2008 cath FINDINGS: Hemodynamics: Mean right atrial pressure was 13 mmHg. RV 43/9, PA 47/28 with mean PA pressure of 37 mmHg. Mean pulmonary capillary wedge pressure of 23 mmHg. Aorta 181/90. Left ventricle 177/17/26. Mixed venous O2 saturation was 65%, aortic saturation 92%, SVR was 1355, cardiac output was 6.55 liters per minute. Cardiac index was 3.45. Left ventriculography: Left ventricle was enlarged. There was global severe hypokinesis with an estimated ejection fraction of 25%. Coronary angiography: The right coronary artery was dominant and showed only luminal irregularities. Left main coronary artery was free from significant disease. The circumflex was a large vessel, there were 3 obtuse marginal vessels with a 30% ostial stenosis of the first obtuse marginal. There was a 30% stenosis in the proximal circumflex between the first and second obtuse marginals. The LAD system actually was a dual LAD type system, with a large diagonal that reached the apex. There is a mild 20% stenosis, just prior to the bifurcation of the large first diagonal off the LAD. The remainder of the LAD did  not have significant disease. There were luminal irregularities in the large first diagonal. This vessel reached the apex. Non-selective abdominal aortography did show 2 right and 2 left renal arteries, there appeared to be an 80% ostial stenosis of the upper left renal artery. This was confirmed by a selective renal angiography.   02/2014 echo Study Conclusions  - Left ventricle: The cavity size was normal. Wall thickness was increased in a pattern of moderate LVH. Sigmoid shaped basal septum. Systolic function was severely reduced. The estimated ejection fraction was in the range of 25% to 30%. Diffuse hypokinesis. There is severe hypokinesis of the mid-apicalanteroseptal, anterior, and apical myocardium. Doppler parameters are consistent with abnormal left ventricular relaxation (grade 1 diastolic dysfunction). - Ventricular septum: Septal motion showed abnormal function and dyssynergy. - Aortic valve: Mildly calcified annulus. Trileaflet. There was trivial regurgitation. - Ascending aorta: The ascending aorta was mildly dilated, 43 mm. - Mitral valve: Calcified annulus. There was trivial regurgitation. - Left atrium: The atrium was severely dilated. - Right atrium: Central venous pressure (est): 3 mm Hg. - Atrial septum: No defect or patent foramen ovale was identified. - Tricuspid valve: There was trivial regurgitation. - Pulmonary arteries: Systolic pressure could not be accurately estimated. - Pericardium, extracardiac: There was no pericardial effusion.  Impressions:  - Limited images. Unable to compare with prior study. Moderate LVH with sigmoid shaped basal septum, LVEF approximately 25-30% with diffuse hypokinesis, most severe mid to apical anteroseptal and anterior wall. Septal dyssynegry. Grade 1 diastolic dysfunction. Severe left atrial enlargement. MIldly dilated ascending aorta. Trivial aortic regurgitation. Trivial  tricuspid regurgitation, unable to assess PASP.  Jan 2017 echo Study Conclusions  - Left ventricle: The cavity size was normal. Wall thickness was increased in a pattern of severe LVH. Systolic function was severely reduced. The estimated ejection fraction was in the range of 20% to 25%. Diffuse hypokinesis. Doppler parameters are consistent with abnormal left ventricular relaxation (grade 1 diastolic dysfunction). - Aortic valve: Mildly calcified annulus. Mildly thickened leaflets. Valve area (VTI): 2.43 cm^2. Valve area (Vmax): 2.39 cm^2. Valve area (Vmean): 2.28 cm^2. - Mitral valve: Mildly calcified annulus. Mildly thickened leaflets . There was mild regurgitation. - Left atrium: The atrium was moderately dilated. - Technically adequate study.  08/2015 Cath 1. Angiographically normal coronary arteries 2. Nonischemic cardiomyopathy - conflicting cardiac outputs between Fick and thermodilution. Suspect that there is some valvular disease that is throwing  off the thermodilution. 3. Essentially normal right heart pressures. Mildly elevated LVEDP and PCWP, but otherwise normal pressures.   Clearly nonischemic cardiomyopathy. The patient's itching anatomy with a tandem LAD system and codominant.  Plan:  Discharge after bedrest following femoral sheath removal.  Continue current management. She appears euvolemic    Assessment and Plan  1. Chronic systolic heart failure - NICM, LVEF 20-25%, she is NYHA III. She remains not interested in ICD, if reconsiders consider CRT ICD given her LBBB - asytmptomatic, continue current meds    2. HTN - bp is at goal,continue current meds  3. OSA - continue CPAP  4. AKI - repeat labs. Occurred in setting of diarrhea and dehydration. She is back on entresto and lasix at home, remains off potassium      Arnoldo Lenis, M.D.

## 2016-12-24 NOTE — Patient Instructions (Signed)
Your physician wants you to follow-up in: [place] will receive a reminder letter in the mail two months in advance. If you don't receive a letter, please call our office to schedule the follow-up appointment.  Your physician recommends that you continue on your current medications as directed. Please refer to the Current Medication list given to you today.  Your physician recommends that you return for lab work CBC/MG/CMP  Thank you for choosing [facility]!!

## 2016-12-26 DIAGNOSIS — I5042 Chronic combined systolic (congestive) and diastolic (congestive) heart failure: Secondary | ICD-10-CM | POA: Diagnosis not present

## 2016-12-30 ENCOUNTER — Encounter: Payer: Self-pay | Admitting: *Deleted

## 2017-01-02 ENCOUNTER — Telehealth: Payer: Self-pay

## 2017-01-02 NOTE — Telephone Encounter (Signed)
Patient notified. Routed to PCP 

## 2017-01-03 DIAGNOSIS — Z4682 Encounter for fitting and adjustment of non-vascular catheter: Secondary | ICD-10-CM | POA: Diagnosis not present

## 2017-01-03 DIAGNOSIS — E876 Hypokalemia: Secondary | ICD-10-CM | POA: Diagnosis present

## 2017-01-03 DIAGNOSIS — M6281 Muscle weakness (generalized): Secondary | ICD-10-CM | POA: Diagnosis not present

## 2017-01-03 DIAGNOSIS — Z6839 Body mass index (BMI) 39.0-39.9, adult: Secondary | ICD-10-CM | POA: Diagnosis not present

## 2017-01-03 DIAGNOSIS — B9620 Unspecified Escherichia coli [E. coli] as the cause of diseases classified elsewhere: Secondary | ICD-10-CM | POA: Diagnosis present

## 2017-01-03 DIAGNOSIS — J9601 Acute respiratory failure with hypoxia: Secondary | ICD-10-CM | POA: Diagnosis not present

## 2017-01-03 DIAGNOSIS — I10 Essential (primary) hypertension: Secondary | ICD-10-CM | POA: Diagnosis not present

## 2017-01-03 DIAGNOSIS — I5023 Acute on chronic systolic (congestive) heart failure: Secondary | ICD-10-CM | POA: Diagnosis not present

## 2017-01-03 DIAGNOSIS — R069 Unspecified abnormalities of breathing: Secondary | ICD-10-CM | POA: Diagnosis not present

## 2017-01-03 DIAGNOSIS — D631 Anemia in chronic kidney disease: Secondary | ICD-10-CM | POA: Diagnosis present

## 2017-01-03 DIAGNOSIS — M1A39X0 Chronic gout due to renal impairment, multiple sites, without tophus (tophi): Secondary | ICD-10-CM | POA: Diagnosis not present

## 2017-01-03 DIAGNOSIS — Z1623 Resistance to quinolones and fluoroquinolones: Secondary | ICD-10-CM | POA: Diagnosis present

## 2017-01-03 DIAGNOSIS — N390 Urinary tract infection, site not specified: Secondary | ICD-10-CM | POA: Diagnosis not present

## 2017-01-03 DIAGNOSIS — J9602 Acute respiratory failure with hypercapnia: Secondary | ICD-10-CM | POA: Diagnosis not present

## 2017-01-03 DIAGNOSIS — Z7409 Other reduced mobility: Secondary | ICD-10-CM | POA: Diagnosis not present

## 2017-01-03 DIAGNOSIS — Z90710 Acquired absence of both cervix and uterus: Secondary | ICD-10-CM | POA: Diagnosis not present

## 2017-01-03 DIAGNOSIS — J9690 Respiratory failure, unspecified, unspecified whether with hypoxia or hypercapnia: Secondary | ICD-10-CM | POA: Diagnosis not present

## 2017-01-03 DIAGNOSIS — E872 Acidosis: Secondary | ICD-10-CM | POA: Diagnosis not present

## 2017-01-03 DIAGNOSIS — I429 Cardiomyopathy, unspecified: Secondary | ICD-10-CM | POA: Diagnosis present

## 2017-01-03 DIAGNOSIS — N183 Chronic kidney disease, stage 3 (moderate): Secondary | ICD-10-CM | POA: Diagnosis present

## 2017-01-03 DIAGNOSIS — R262 Difficulty in walking, not elsewhere classified: Secondary | ICD-10-CM | POA: Diagnosis not present

## 2017-01-03 DIAGNOSIS — I130 Hypertensive heart and chronic kidney disease with heart failure and stage 1 through stage 4 chronic kidney disease, or unspecified chronic kidney disease: Secondary | ICD-10-CM | POA: Diagnosis present

## 2017-01-03 DIAGNOSIS — E669 Obesity, unspecified: Secondary | ICD-10-CM | POA: Diagnosis not present

## 2017-01-03 DIAGNOSIS — I509 Heart failure, unspecified: Secondary | ICD-10-CM | POA: Diagnosis not present

## 2017-01-03 DIAGNOSIS — Z881 Allergy status to other antibiotic agents status: Secondary | ICD-10-CM | POA: Diagnosis not present

## 2017-01-03 DIAGNOSIS — J9692 Respiratory failure, unspecified with hypercapnia: Secondary | ICD-10-CM | POA: Diagnosis not present

## 2017-01-03 DIAGNOSIS — N179 Acute kidney failure, unspecified: Secondary | ICD-10-CM | POA: Diagnosis present

## 2017-01-07 DIAGNOSIS — R262 Difficulty in walking, not elsewhere classified: Secondary | ICD-10-CM | POA: Diagnosis not present

## 2017-01-07 DIAGNOSIS — J9601 Acute respiratory failure with hypoxia: Secondary | ICD-10-CM | POA: Diagnosis not present

## 2017-01-07 DIAGNOSIS — J9602 Acute respiratory failure with hypercapnia: Secondary | ICD-10-CM | POA: Diagnosis not present

## 2017-01-07 DIAGNOSIS — D631 Anemia in chronic kidney disease: Secondary | ICD-10-CM | POA: Diagnosis not present

## 2017-01-07 DIAGNOSIS — N390 Urinary tract infection, site not specified: Secondary | ICD-10-CM | POA: Diagnosis not present

## 2017-01-07 DIAGNOSIS — M6281 Muscle weakness (generalized): Secondary | ICD-10-CM | POA: Diagnosis not present

## 2017-01-07 DIAGNOSIS — J9690 Respiratory failure, unspecified, unspecified whether with hypoxia or hypercapnia: Secondary | ICD-10-CM | POA: Diagnosis not present

## 2017-01-07 DIAGNOSIS — I5022 Chronic systolic (congestive) heart failure: Secondary | ICD-10-CM | POA: Diagnosis not present

## 2017-01-07 DIAGNOSIS — E876 Hypokalemia: Secondary | ICD-10-CM | POA: Diagnosis not present

## 2017-01-07 DIAGNOSIS — B9620 Unspecified Escherichia coli [E. coli] as the cause of diseases classified elsewhere: Secondary | ICD-10-CM | POA: Diagnosis not present

## 2017-01-07 DIAGNOSIS — E872 Acidosis: Secondary | ICD-10-CM | POA: Diagnosis not present

## 2017-01-07 DIAGNOSIS — M1A39X0 Chronic gout due to renal impairment, multiple sites, without tophus (tophi): Secondary | ICD-10-CM | POA: Diagnosis not present

## 2017-01-07 DIAGNOSIS — N179 Acute kidney failure, unspecified: Secondary | ICD-10-CM | POA: Diagnosis not present

## 2017-01-07 DIAGNOSIS — I10 Essential (primary) hypertension: Secondary | ICD-10-CM | POA: Diagnosis not present

## 2017-01-07 DIAGNOSIS — E669 Obesity, unspecified: Secondary | ICD-10-CM | POA: Diagnosis not present

## 2017-01-07 DIAGNOSIS — Z7409 Other reduced mobility: Secondary | ICD-10-CM | POA: Diagnosis not present

## 2017-01-07 DIAGNOSIS — N183 Chronic kidney disease, stage 3 (moderate): Secondary | ICD-10-CM | POA: Diagnosis not present

## 2017-01-07 DIAGNOSIS — I509 Heart failure, unspecified: Secondary | ICD-10-CM | POA: Diagnosis not present

## 2017-01-07 DIAGNOSIS — I5023 Acute on chronic systolic (congestive) heart failure: Secondary | ICD-10-CM | POA: Diagnosis not present

## 2017-01-09 DIAGNOSIS — I10 Essential (primary) hypertension: Secondary | ICD-10-CM | POA: Diagnosis not present

## 2017-01-09 DIAGNOSIS — N390 Urinary tract infection, site not specified: Secondary | ICD-10-CM | POA: Diagnosis not present

## 2017-01-09 DIAGNOSIS — J9601 Acute respiratory failure with hypoxia: Secondary | ICD-10-CM | POA: Diagnosis not present

## 2017-01-09 DIAGNOSIS — I5023 Acute on chronic systolic (congestive) heart failure: Secondary | ICD-10-CM | POA: Diagnosis not present

## 2017-01-10 DIAGNOSIS — J9601 Acute respiratory failure with hypoxia: Secondary | ICD-10-CM | POA: Diagnosis not present

## 2017-01-10 DIAGNOSIS — I5023 Acute on chronic systolic (congestive) heart failure: Secondary | ICD-10-CM | POA: Diagnosis not present

## 2017-01-10 DIAGNOSIS — I10 Essential (primary) hypertension: Secondary | ICD-10-CM | POA: Diagnosis not present

## 2017-01-10 DIAGNOSIS — E872 Acidosis: Secondary | ICD-10-CM | POA: Diagnosis not present

## 2017-01-15 DIAGNOSIS — J9601 Acute respiratory failure with hypoxia: Secondary | ICD-10-CM | POA: Diagnosis not present

## 2017-01-15 DIAGNOSIS — I5022 Chronic systolic (congestive) heart failure: Secondary | ICD-10-CM | POA: Diagnosis not present

## 2017-01-15 DIAGNOSIS — N183 Chronic kidney disease, stage 3 (moderate): Secondary | ICD-10-CM | POA: Diagnosis not present

## 2017-01-15 DIAGNOSIS — I10 Essential (primary) hypertension: Secondary | ICD-10-CM | POA: Diagnosis not present

## 2017-01-20 DIAGNOSIS — E872 Acidosis: Secondary | ICD-10-CM | POA: Diagnosis not present

## 2017-01-20 DIAGNOSIS — I5023 Acute on chronic systolic (congestive) heart failure: Secondary | ICD-10-CM | POA: Diagnosis not present

## 2017-01-20 DIAGNOSIS — I10 Essential (primary) hypertension: Secondary | ICD-10-CM | POA: Diagnosis not present

## 2017-01-20 DIAGNOSIS — J9601 Acute respiratory failure with hypoxia: Secondary | ICD-10-CM | POA: Diagnosis not present

## 2017-01-22 DIAGNOSIS — Z6836 Body mass index (BMI) 36.0-36.9, adult: Secondary | ICD-10-CM | POA: Diagnosis not present

## 2017-01-22 DIAGNOSIS — E669 Obesity, unspecified: Secondary | ICD-10-CM | POA: Diagnosis not present

## 2017-01-22 DIAGNOSIS — D631 Anemia in chronic kidney disease: Secondary | ICD-10-CM | POA: Diagnosis not present

## 2017-01-22 DIAGNOSIS — I5023 Acute on chronic systolic (congestive) heart failure: Secondary | ICD-10-CM | POA: Diagnosis not present

## 2017-01-22 DIAGNOSIS — I130 Hypertensive heart and chronic kidney disease with heart failure and stage 1 through stage 4 chronic kidney disease, or unspecified chronic kidney disease: Secondary | ICD-10-CM | POA: Diagnosis not present

## 2017-01-22 DIAGNOSIS — N183 Chronic kidney disease, stage 3 (moderate): Secondary | ICD-10-CM | POA: Diagnosis not present

## 2017-01-22 DIAGNOSIS — M6281 Muscle weakness (generalized): Secondary | ICD-10-CM | POA: Diagnosis not present

## 2017-01-27 DIAGNOSIS — D631 Anemia in chronic kidney disease: Secondary | ICD-10-CM | POA: Diagnosis not present

## 2017-01-27 DIAGNOSIS — I509 Heart failure, unspecified: Secondary | ICD-10-CM | POA: Diagnosis not present

## 2017-01-27 DIAGNOSIS — N183 Chronic kidney disease, stage 3 (moderate): Secondary | ICD-10-CM | POA: Diagnosis not present

## 2017-01-27 DIAGNOSIS — N250 Renal osteodystrophy: Secondary | ICD-10-CM | POA: Diagnosis not present

## 2017-01-27 DIAGNOSIS — I10 Essential (primary) hypertension: Secondary | ICD-10-CM | POA: Diagnosis not present

## 2017-01-28 DIAGNOSIS — M6281 Muscle weakness (generalized): Secondary | ICD-10-CM | POA: Diagnosis not present

## 2017-01-28 DIAGNOSIS — D631 Anemia in chronic kidney disease: Secondary | ICD-10-CM | POA: Diagnosis not present

## 2017-01-28 DIAGNOSIS — N183 Chronic kidney disease, stage 3 (moderate): Secondary | ICD-10-CM | POA: Diagnosis not present

## 2017-01-28 DIAGNOSIS — I5023 Acute on chronic systolic (congestive) heart failure: Secondary | ICD-10-CM | POA: Diagnosis not present

## 2017-01-28 DIAGNOSIS — I130 Hypertensive heart and chronic kidney disease with heart failure and stage 1 through stage 4 chronic kidney disease, or unspecified chronic kidney disease: Secondary | ICD-10-CM | POA: Diagnosis not present

## 2017-01-28 DIAGNOSIS — E669 Obesity, unspecified: Secondary | ICD-10-CM | POA: Diagnosis not present

## 2017-01-31 DIAGNOSIS — M6281 Muscle weakness (generalized): Secondary | ICD-10-CM | POA: Diagnosis not present

## 2017-01-31 DIAGNOSIS — I5023 Acute on chronic systolic (congestive) heart failure: Secondary | ICD-10-CM | POA: Diagnosis not present

## 2017-01-31 DIAGNOSIS — D631 Anemia in chronic kidney disease: Secondary | ICD-10-CM | POA: Diagnosis not present

## 2017-01-31 DIAGNOSIS — E669 Obesity, unspecified: Secondary | ICD-10-CM | POA: Diagnosis not present

## 2017-01-31 DIAGNOSIS — I130 Hypertensive heart and chronic kidney disease with heart failure and stage 1 through stage 4 chronic kidney disease, or unspecified chronic kidney disease: Secondary | ICD-10-CM | POA: Diagnosis not present

## 2017-01-31 DIAGNOSIS — N183 Chronic kidney disease, stage 3 (moderate): Secondary | ICD-10-CM | POA: Diagnosis not present

## 2017-02-04 DIAGNOSIS — I5023 Acute on chronic systolic (congestive) heart failure: Secondary | ICD-10-CM | POA: Diagnosis not present

## 2017-02-04 DIAGNOSIS — D631 Anemia in chronic kidney disease: Secondary | ICD-10-CM | POA: Diagnosis not present

## 2017-02-04 DIAGNOSIS — E669 Obesity, unspecified: Secondary | ICD-10-CM | POA: Diagnosis not present

## 2017-02-04 DIAGNOSIS — M6281 Muscle weakness (generalized): Secondary | ICD-10-CM | POA: Diagnosis not present

## 2017-02-04 DIAGNOSIS — I130 Hypertensive heart and chronic kidney disease with heart failure and stage 1 through stage 4 chronic kidney disease, or unspecified chronic kidney disease: Secondary | ICD-10-CM | POA: Diagnosis not present

## 2017-02-04 DIAGNOSIS — N183 Chronic kidney disease, stage 3 (moderate): Secondary | ICD-10-CM | POA: Diagnosis not present

## 2017-02-06 DIAGNOSIS — E559 Vitamin D deficiency, unspecified: Secondary | ICD-10-CM | POA: Diagnosis not present

## 2017-02-06 DIAGNOSIS — I10 Essential (primary) hypertension: Secondary | ICD-10-CM | POA: Diagnosis not present

## 2017-02-06 DIAGNOSIS — E58 Dietary calcium deficiency: Secondary | ICD-10-CM | POA: Diagnosis not present

## 2017-02-06 DIAGNOSIS — I429 Cardiomyopathy, unspecified: Secondary | ICD-10-CM | POA: Diagnosis not present

## 2017-02-06 DIAGNOSIS — Z6835 Body mass index (BMI) 35.0-35.9, adult: Secondary | ICD-10-CM | POA: Diagnosis not present

## 2017-02-06 DIAGNOSIS — Z713 Dietary counseling and surveillance: Secondary | ICD-10-CM | POA: Diagnosis not present

## 2017-02-06 DIAGNOSIS — Z299 Encounter for prophylactic measures, unspecified: Secondary | ICD-10-CM | POA: Diagnosis not present

## 2017-02-06 DIAGNOSIS — I509 Heart failure, unspecified: Secondary | ICD-10-CM | POA: Diagnosis not present

## 2017-02-07 DIAGNOSIS — I130 Hypertensive heart and chronic kidney disease with heart failure and stage 1 through stage 4 chronic kidney disease, or unspecified chronic kidney disease: Secondary | ICD-10-CM | POA: Diagnosis not present

## 2017-02-07 DIAGNOSIS — E669 Obesity, unspecified: Secondary | ICD-10-CM | POA: Diagnosis not present

## 2017-02-07 DIAGNOSIS — M6281 Muscle weakness (generalized): Secondary | ICD-10-CM | POA: Diagnosis not present

## 2017-02-07 DIAGNOSIS — N183 Chronic kidney disease, stage 3 (moderate): Secondary | ICD-10-CM | POA: Diagnosis not present

## 2017-02-07 DIAGNOSIS — D631 Anemia in chronic kidney disease: Secondary | ICD-10-CM | POA: Diagnosis not present

## 2017-02-07 DIAGNOSIS — I5023 Acute on chronic systolic (congestive) heart failure: Secondary | ICD-10-CM | POA: Diagnosis not present

## 2017-02-12 DIAGNOSIS — E669 Obesity, unspecified: Secondary | ICD-10-CM | POA: Diagnosis not present

## 2017-02-12 DIAGNOSIS — N183 Chronic kidney disease, stage 3 (moderate): Secondary | ICD-10-CM | POA: Diagnosis not present

## 2017-02-12 DIAGNOSIS — D631 Anemia in chronic kidney disease: Secondary | ICD-10-CM | POA: Diagnosis not present

## 2017-02-12 DIAGNOSIS — I130 Hypertensive heart and chronic kidney disease with heart failure and stage 1 through stage 4 chronic kidney disease, or unspecified chronic kidney disease: Secondary | ICD-10-CM | POA: Diagnosis not present

## 2017-02-12 DIAGNOSIS — I5023 Acute on chronic systolic (congestive) heart failure: Secondary | ICD-10-CM | POA: Diagnosis not present

## 2017-02-12 DIAGNOSIS — M6281 Muscle weakness (generalized): Secondary | ICD-10-CM | POA: Diagnosis not present

## 2017-02-18 DIAGNOSIS — J9601 Acute respiratory failure with hypoxia: Secondary | ICD-10-CM | POA: Diagnosis not present

## 2017-02-18 DIAGNOSIS — N390 Urinary tract infection, site not specified: Secondary | ICD-10-CM | POA: Diagnosis not present

## 2017-02-18 DIAGNOSIS — I130 Hypertensive heart and chronic kidney disease with heart failure and stage 1 through stage 4 chronic kidney disease, or unspecified chronic kidney disease: Secondary | ICD-10-CM | POA: Diagnosis not present

## 2017-02-18 DIAGNOSIS — J9622 Acute and chronic respiratory failure with hypercapnia: Secondary | ICD-10-CM | POA: Diagnosis not present

## 2017-02-18 DIAGNOSIS — J9621 Acute and chronic respiratory failure with hypoxia: Secondary | ICD-10-CM | POA: Diagnosis not present

## 2017-02-18 DIAGNOSIS — N184 Chronic kidney disease, stage 4 (severe): Secondary | ICD-10-CM | POA: Diagnosis not present

## 2017-02-18 DIAGNOSIS — I5023 Acute on chronic systolic (congestive) heart failure: Secondary | ICD-10-CM | POA: Diagnosis not present

## 2017-02-18 DIAGNOSIS — J9602 Acute respiratory failure with hypercapnia: Secondary | ICD-10-CM | POA: Diagnosis not present

## 2017-02-18 DIAGNOSIS — N189 Chronic kidney disease, unspecified: Secondary | ICD-10-CM | POA: Diagnosis not present

## 2017-02-18 DIAGNOSIS — R0600 Dyspnea, unspecified: Secondary | ICD-10-CM | POA: Diagnosis not present

## 2017-02-19 DIAGNOSIS — R7881 Bacteremia: Secondary | ICD-10-CM | POA: Diagnosis present

## 2017-02-19 DIAGNOSIS — J9601 Acute respiratory failure with hypoxia: Secondary | ICD-10-CM | POA: Diagnosis not present

## 2017-02-19 DIAGNOSIS — J9602 Acute respiratory failure with hypercapnia: Secondary | ICD-10-CM | POA: Diagnosis not present

## 2017-02-19 DIAGNOSIS — J9621 Acute and chronic respiratory failure with hypoxia: Secondary | ICD-10-CM | POA: Diagnosis present

## 2017-02-19 DIAGNOSIS — J90 Pleural effusion, not elsewhere classified: Secondary | ICD-10-CM | POA: Diagnosis not present

## 2017-02-19 DIAGNOSIS — G4733 Obstructive sleep apnea (adult) (pediatric): Secondary | ICD-10-CM | POA: Diagnosis present

## 2017-02-19 DIAGNOSIS — N183 Chronic kidney disease, stage 3 (moderate): Secondary | ICD-10-CM | POA: Diagnosis not present

## 2017-02-19 DIAGNOSIS — I130 Hypertensive heart and chronic kidney disease with heart failure and stage 1 through stage 4 chronic kidney disease, or unspecified chronic kidney disease: Secondary | ICD-10-CM | POA: Diagnosis present

## 2017-02-19 DIAGNOSIS — K5900 Constipation, unspecified: Secondary | ICD-10-CM | POA: Diagnosis present

## 2017-02-19 DIAGNOSIS — N184 Chronic kidney disease, stage 4 (severe): Secondary | ICD-10-CM | POA: Diagnosis present

## 2017-02-19 DIAGNOSIS — I5021 Acute systolic (congestive) heart failure: Secondary | ICD-10-CM | POA: Diagnosis not present

## 2017-02-19 DIAGNOSIS — I5023 Acute on chronic systolic (congestive) heart failure: Secondary | ICD-10-CM | POA: Diagnosis present

## 2017-02-19 DIAGNOSIS — N390 Urinary tract infection, site not specified: Secondary | ICD-10-CM | POA: Diagnosis present

## 2017-02-19 DIAGNOSIS — B957 Other staphylococcus as the cause of diseases classified elsewhere: Secondary | ICD-10-CM | POA: Diagnosis present

## 2017-02-19 DIAGNOSIS — E669 Obesity, unspecified: Secondary | ICD-10-CM | POA: Diagnosis present

## 2017-02-19 DIAGNOSIS — Z6836 Body mass index (BMI) 36.0-36.9, adult: Secondary | ICD-10-CM | POA: Diagnosis not present

## 2017-02-19 DIAGNOSIS — Z9851 Tubal ligation status: Secondary | ICD-10-CM | POA: Diagnosis not present

## 2017-02-19 DIAGNOSIS — B9620 Unspecified Escherichia coli [E. coli] as the cause of diseases classified elsewhere: Secondary | ICD-10-CM | POA: Diagnosis present

## 2017-02-19 DIAGNOSIS — Z881 Allergy status to other antibiotic agents status: Secondary | ICD-10-CM | POA: Diagnosis not present

## 2017-02-19 DIAGNOSIS — E876 Hypokalemia: Secondary | ICD-10-CM | POA: Diagnosis present

## 2017-02-19 DIAGNOSIS — Z9981 Dependence on supplemental oxygen: Secondary | ICD-10-CM | POA: Diagnosis not present

## 2017-02-19 DIAGNOSIS — J9622 Acute and chronic respiratory failure with hypercapnia: Secondary | ICD-10-CM | POA: Diagnosis present

## 2017-02-24 ENCOUNTER — Other Ambulatory Visit: Payer: Self-pay | Admitting: Cardiovascular Disease

## 2017-02-24 DIAGNOSIS — I5023 Acute on chronic systolic (congestive) heart failure: Secondary | ICD-10-CM | POA: Diagnosis not present

## 2017-02-24 DIAGNOSIS — I130 Hypertensive heart and chronic kidney disease with heart failure and stage 1 through stage 4 chronic kidney disease, or unspecified chronic kidney disease: Secondary | ICD-10-CM | POA: Diagnosis not present

## 2017-02-24 DIAGNOSIS — D631 Anemia in chronic kidney disease: Secondary | ICD-10-CM | POA: Diagnosis not present

## 2017-02-24 DIAGNOSIS — E669 Obesity, unspecified: Secondary | ICD-10-CM | POA: Diagnosis not present

## 2017-02-24 DIAGNOSIS — N183 Chronic kidney disease, stage 3 (moderate): Secondary | ICD-10-CM | POA: Diagnosis not present

## 2017-02-24 DIAGNOSIS — M6281 Muscle weakness (generalized): Secondary | ICD-10-CM | POA: Diagnosis not present

## 2017-02-28 DIAGNOSIS — I5023 Acute on chronic systolic (congestive) heart failure: Secondary | ICD-10-CM | POA: Diagnosis not present

## 2017-02-28 DIAGNOSIS — M6281 Muscle weakness (generalized): Secondary | ICD-10-CM | POA: Diagnosis not present

## 2017-02-28 DIAGNOSIS — D631 Anemia in chronic kidney disease: Secondary | ICD-10-CM | POA: Diagnosis not present

## 2017-02-28 DIAGNOSIS — N183 Chronic kidney disease, stage 3 (moderate): Secondary | ICD-10-CM | POA: Diagnosis not present

## 2017-02-28 DIAGNOSIS — I130 Hypertensive heart and chronic kidney disease with heart failure and stage 1 through stage 4 chronic kidney disease, or unspecified chronic kidney disease: Secondary | ICD-10-CM | POA: Diagnosis not present

## 2017-02-28 DIAGNOSIS — E669 Obesity, unspecified: Secondary | ICD-10-CM | POA: Diagnosis not present

## 2017-03-05 DIAGNOSIS — I10 Essential (primary) hypertension: Secondary | ICD-10-CM | POA: Diagnosis not present

## 2017-03-05 DIAGNOSIS — I509 Heart failure, unspecified: Secondary | ICD-10-CM | POA: Diagnosis not present

## 2017-03-05 DIAGNOSIS — Z6835 Body mass index (BMI) 35.0-35.9, adult: Secondary | ICD-10-CM | POA: Diagnosis not present

## 2017-03-05 DIAGNOSIS — Z299 Encounter for prophylactic measures, unspecified: Secondary | ICD-10-CM | POA: Diagnosis not present

## 2017-03-05 DIAGNOSIS — I429 Cardiomyopathy, unspecified: Secondary | ICD-10-CM | POA: Diagnosis not present

## 2017-03-07 DIAGNOSIS — I130 Hypertensive heart and chronic kidney disease with heart failure and stage 1 through stage 4 chronic kidney disease, or unspecified chronic kidney disease: Secondary | ICD-10-CM | POA: Diagnosis not present

## 2017-03-07 DIAGNOSIS — I5023 Acute on chronic systolic (congestive) heart failure: Secondary | ICD-10-CM | POA: Diagnosis not present

## 2017-03-07 DIAGNOSIS — N183 Chronic kidney disease, stage 3 (moderate): Secondary | ICD-10-CM | POA: Diagnosis not present

## 2017-03-07 DIAGNOSIS — E669 Obesity, unspecified: Secondary | ICD-10-CM | POA: Diagnosis not present

## 2017-03-07 DIAGNOSIS — M6281 Muscle weakness (generalized): Secondary | ICD-10-CM | POA: Diagnosis not present

## 2017-03-07 DIAGNOSIS — D631 Anemia in chronic kidney disease: Secondary | ICD-10-CM | POA: Diagnosis not present

## 2017-03-10 DIAGNOSIS — I5023 Acute on chronic systolic (congestive) heart failure: Secondary | ICD-10-CM | POA: Diagnosis not present

## 2017-03-10 DIAGNOSIS — N183 Chronic kidney disease, stage 3 (moderate): Secondary | ICD-10-CM | POA: Diagnosis not present

## 2017-03-10 DIAGNOSIS — I509 Heart failure, unspecified: Secondary | ICD-10-CM | POA: Diagnosis not present

## 2017-03-10 DIAGNOSIS — D631 Anemia in chronic kidney disease: Secondary | ICD-10-CM | POA: Diagnosis not present

## 2017-03-10 DIAGNOSIS — I130 Hypertensive heart and chronic kidney disease with heart failure and stage 1 through stage 4 chronic kidney disease, or unspecified chronic kidney disease: Secondary | ICD-10-CM | POA: Diagnosis not present

## 2017-03-10 DIAGNOSIS — M6281 Muscle weakness (generalized): Secondary | ICD-10-CM | POA: Diagnosis not present

## 2017-03-10 DIAGNOSIS — N189 Chronic kidney disease, unspecified: Secondary | ICD-10-CM | POA: Diagnosis not present

## 2017-03-10 DIAGNOSIS — E669 Obesity, unspecified: Secondary | ICD-10-CM | POA: Diagnosis not present

## 2017-03-19 DIAGNOSIS — D631 Anemia in chronic kidney disease: Secondary | ICD-10-CM | POA: Diagnosis not present

## 2017-03-19 DIAGNOSIS — M6281 Muscle weakness (generalized): Secondary | ICD-10-CM | POA: Diagnosis not present

## 2017-03-19 DIAGNOSIS — E669 Obesity, unspecified: Secondary | ICD-10-CM | POA: Diagnosis not present

## 2017-03-19 DIAGNOSIS — N183 Chronic kidney disease, stage 3 (moderate): Secondary | ICD-10-CM | POA: Diagnosis not present

## 2017-03-19 DIAGNOSIS — I130 Hypertensive heart and chronic kidney disease with heart failure and stage 1 through stage 4 chronic kidney disease, or unspecified chronic kidney disease: Secondary | ICD-10-CM | POA: Diagnosis not present

## 2017-03-19 DIAGNOSIS — I5023 Acute on chronic systolic (congestive) heart failure: Secondary | ICD-10-CM | POA: Diagnosis not present

## 2017-03-23 DIAGNOSIS — N183 Chronic kidney disease, stage 3 (moderate): Secondary | ICD-10-CM | POA: Diagnosis not present

## 2017-03-23 DIAGNOSIS — Z6836 Body mass index (BMI) 36.0-36.9, adult: Secondary | ICD-10-CM | POA: Diagnosis not present

## 2017-03-23 DIAGNOSIS — M6281 Muscle weakness (generalized): Secondary | ICD-10-CM | POA: Diagnosis not present

## 2017-03-23 DIAGNOSIS — I5023 Acute on chronic systolic (congestive) heart failure: Secondary | ICD-10-CM | POA: Diagnosis not present

## 2017-03-23 DIAGNOSIS — D631 Anemia in chronic kidney disease: Secondary | ICD-10-CM | POA: Diagnosis not present

## 2017-03-23 DIAGNOSIS — I130 Hypertensive heart and chronic kidney disease with heart failure and stage 1 through stage 4 chronic kidney disease, or unspecified chronic kidney disease: Secondary | ICD-10-CM | POA: Diagnosis not present

## 2017-03-23 DIAGNOSIS — E669 Obesity, unspecified: Secondary | ICD-10-CM | POA: Diagnosis not present

## 2017-03-26 DIAGNOSIS — E669 Obesity, unspecified: Secondary | ICD-10-CM | POA: Diagnosis not present

## 2017-03-26 DIAGNOSIS — N183 Chronic kidney disease, stage 3 (moderate): Secondary | ICD-10-CM | POA: Diagnosis not present

## 2017-03-26 DIAGNOSIS — I5023 Acute on chronic systolic (congestive) heart failure: Secondary | ICD-10-CM | POA: Diagnosis not present

## 2017-03-26 DIAGNOSIS — I130 Hypertensive heart and chronic kidney disease with heart failure and stage 1 through stage 4 chronic kidney disease, or unspecified chronic kidney disease: Secondary | ICD-10-CM | POA: Diagnosis not present

## 2017-03-26 DIAGNOSIS — D631 Anemia in chronic kidney disease: Secondary | ICD-10-CM | POA: Diagnosis not present

## 2017-03-26 DIAGNOSIS — M6281 Muscle weakness (generalized): Secondary | ICD-10-CM | POA: Diagnosis not present

## 2017-04-03 DIAGNOSIS — E669 Obesity, unspecified: Secondary | ICD-10-CM | POA: Diagnosis not present

## 2017-04-03 DIAGNOSIS — I5023 Acute on chronic systolic (congestive) heart failure: Secondary | ICD-10-CM | POA: Diagnosis not present

## 2017-04-03 DIAGNOSIS — M6281 Muscle weakness (generalized): Secondary | ICD-10-CM | POA: Diagnosis not present

## 2017-04-03 DIAGNOSIS — N183 Chronic kidney disease, stage 3 (moderate): Secondary | ICD-10-CM | POA: Diagnosis not present

## 2017-04-03 DIAGNOSIS — I130 Hypertensive heart and chronic kidney disease with heart failure and stage 1 through stage 4 chronic kidney disease, or unspecified chronic kidney disease: Secondary | ICD-10-CM | POA: Diagnosis not present

## 2017-04-03 DIAGNOSIS — D631 Anemia in chronic kidney disease: Secondary | ICD-10-CM | POA: Diagnosis not present

## 2017-04-09 DIAGNOSIS — I130 Hypertensive heart and chronic kidney disease with heart failure and stage 1 through stage 4 chronic kidney disease, or unspecified chronic kidney disease: Secondary | ICD-10-CM | POA: Diagnosis not present

## 2017-04-09 DIAGNOSIS — D631 Anemia in chronic kidney disease: Secondary | ICD-10-CM | POA: Diagnosis not present

## 2017-04-09 DIAGNOSIS — M6281 Muscle weakness (generalized): Secondary | ICD-10-CM | POA: Diagnosis not present

## 2017-04-09 DIAGNOSIS — E669 Obesity, unspecified: Secondary | ICD-10-CM | POA: Diagnosis not present

## 2017-04-09 DIAGNOSIS — N183 Chronic kidney disease, stage 3 (moderate): Secondary | ICD-10-CM | POA: Diagnosis not present

## 2017-04-09 DIAGNOSIS — I5023 Acute on chronic systolic (congestive) heart failure: Secondary | ICD-10-CM | POA: Diagnosis not present

## 2017-04-16 DIAGNOSIS — D631 Anemia in chronic kidney disease: Secondary | ICD-10-CM | POA: Diagnosis not present

## 2017-04-16 DIAGNOSIS — M6281 Muscle weakness (generalized): Secondary | ICD-10-CM | POA: Diagnosis not present

## 2017-04-16 DIAGNOSIS — I130 Hypertensive heart and chronic kidney disease with heart failure and stage 1 through stage 4 chronic kidney disease, or unspecified chronic kidney disease: Secondary | ICD-10-CM | POA: Diagnosis not present

## 2017-04-16 DIAGNOSIS — N183 Chronic kidney disease, stage 3 (moderate): Secondary | ICD-10-CM | POA: Diagnosis not present

## 2017-04-16 DIAGNOSIS — I5023 Acute on chronic systolic (congestive) heart failure: Secondary | ICD-10-CM | POA: Diagnosis not present

## 2017-04-16 DIAGNOSIS — E669 Obesity, unspecified: Secondary | ICD-10-CM | POA: Diagnosis not present

## 2017-04-21 ENCOUNTER — Other Ambulatory Visit: Payer: Self-pay | Admitting: Cardiology

## 2017-04-23 DIAGNOSIS — M6281 Muscle weakness (generalized): Secondary | ICD-10-CM | POA: Diagnosis not present

## 2017-04-23 DIAGNOSIS — I5023 Acute on chronic systolic (congestive) heart failure: Secondary | ICD-10-CM | POA: Diagnosis not present

## 2017-04-23 DIAGNOSIS — E669 Obesity, unspecified: Secondary | ICD-10-CM | POA: Diagnosis not present

## 2017-04-23 DIAGNOSIS — I130 Hypertensive heart and chronic kidney disease with heart failure and stage 1 through stage 4 chronic kidney disease, or unspecified chronic kidney disease: Secondary | ICD-10-CM | POA: Diagnosis not present

## 2017-04-23 DIAGNOSIS — D631 Anemia in chronic kidney disease: Secondary | ICD-10-CM | POA: Diagnosis not present

## 2017-04-23 DIAGNOSIS — N183 Chronic kidney disease, stage 3 (moderate): Secondary | ICD-10-CM | POA: Diagnosis not present

## 2017-04-29 ENCOUNTER — Other Ambulatory Visit: Payer: Self-pay | Admitting: Cardiology

## 2017-04-30 DIAGNOSIS — I5023 Acute on chronic systolic (congestive) heart failure: Secondary | ICD-10-CM | POA: Diagnosis not present

## 2017-04-30 DIAGNOSIS — E669 Obesity, unspecified: Secondary | ICD-10-CM | POA: Diagnosis not present

## 2017-04-30 DIAGNOSIS — I130 Hypertensive heart and chronic kidney disease with heart failure and stage 1 through stage 4 chronic kidney disease, or unspecified chronic kidney disease: Secondary | ICD-10-CM | POA: Diagnosis not present

## 2017-04-30 DIAGNOSIS — M6281 Muscle weakness (generalized): Secondary | ICD-10-CM | POA: Diagnosis not present

## 2017-04-30 DIAGNOSIS — N183 Chronic kidney disease, stage 3 (moderate): Secondary | ICD-10-CM | POA: Diagnosis not present

## 2017-04-30 DIAGNOSIS — D631 Anemia in chronic kidney disease: Secondary | ICD-10-CM | POA: Diagnosis not present

## 2017-05-23 DIAGNOSIS — M159 Polyosteoarthritis, unspecified: Secondary | ICD-10-CM | POA: Diagnosis not present

## 2017-05-23 DIAGNOSIS — I10 Essential (primary) hypertension: Secondary | ICD-10-CM | POA: Diagnosis not present

## 2017-05-23 DIAGNOSIS — I509 Heart failure, unspecified: Secondary | ICD-10-CM | POA: Diagnosis not present

## 2017-05-25 ENCOUNTER — Other Ambulatory Visit: Payer: Self-pay | Admitting: Cardiology

## 2017-05-27 ENCOUNTER — Other Ambulatory Visit: Payer: Self-pay | Admitting: Cardiology

## 2017-05-28 ENCOUNTER — Other Ambulatory Visit: Payer: Self-pay | Admitting: Cardiology

## 2017-06-02 ENCOUNTER — Other Ambulatory Visit: Payer: Self-pay | Admitting: Cardiology

## 2017-06-02 MED ORDER — CARVEDILOL 6.25 MG PO TABS: 6.2500 mg | ORAL_TABLET | Freq: Two times a day (BID) | ORAL | 0 refills | Status: DC

## 2017-06-02 NOTE — Telephone Encounter (Signed)
°*  STAT* If patient is at the pharmacy, call can be transferred to refill team.   1. Which medications need to be refilled? carvedilol (COREG) 6.25 MG tablet    2. Which pharmacy/location (including street and city if local pharmacy) is medication to be sent to? [place]  3. Do they need a 30 day or 90 day supply?

## 2017-06-02 NOTE — Telephone Encounter (Signed)
Rx sent 

## 2017-06-03 DIAGNOSIS — Z299 Encounter for prophylactic measures, unspecified: Secondary | ICD-10-CM | POA: Diagnosis not present

## 2017-06-03 DIAGNOSIS — I509 Heart failure, unspecified: Secondary | ICD-10-CM | POA: Diagnosis not present

## 2017-06-03 DIAGNOSIS — I10 Essential (primary) hypertension: Secondary | ICD-10-CM | POA: Diagnosis not present

## 2017-06-03 DIAGNOSIS — Z6833 Body mass index (BMI) 33.0-33.9, adult: Secondary | ICD-10-CM | POA: Diagnosis not present

## 2017-06-03 DIAGNOSIS — Z79899 Other long term (current) drug therapy: Secondary | ICD-10-CM | POA: Diagnosis not present

## 2017-06-03 DIAGNOSIS — I429 Cardiomyopathy, unspecified: Secondary | ICD-10-CM | POA: Diagnosis not present

## 2017-06-11 DIAGNOSIS — I10 Essential (primary) hypertension: Secondary | ICD-10-CM | POA: Diagnosis not present

## 2017-06-11 DIAGNOSIS — M159 Polyosteoarthritis, unspecified: Secondary | ICD-10-CM | POA: Diagnosis not present

## 2017-06-11 DIAGNOSIS — I509 Heart failure, unspecified: Secondary | ICD-10-CM | POA: Diagnosis not present

## 2017-06-16 ENCOUNTER — Encounter: Payer: Self-pay | Admitting: *Deleted

## 2017-06-16 ENCOUNTER — Encounter: Payer: Self-pay | Admitting: Cardiology

## 2017-06-16 ENCOUNTER — Ambulatory Visit (INDEPENDENT_AMBULATORY_CARE_PROVIDER_SITE_OTHER): Payer: Medicare Other | Admitting: Cardiology

## 2017-06-16 VITALS — BP 140/74 | HR 60 | Ht 64.0 in | Wt 205.0 lb

## 2017-06-16 DIAGNOSIS — I5022 Chronic systolic (congestive) heart failure: Secondary | ICD-10-CM | POA: Diagnosis not present

## 2017-06-16 DIAGNOSIS — I447 Left bundle-branch block, unspecified: Secondary | ICD-10-CM

## 2017-06-16 DIAGNOSIS — I10 Essential (primary) hypertension: Secondary | ICD-10-CM

## 2017-06-16 NOTE — Patient Instructions (Signed)
Your physician wants you to follow-up in: [place]  Your physician recommends that you continue on your current medications as directed. Please refer to the Current Medication list given to you today.  Thank you for choosing [facility]!!

## 2017-06-16 NOTE — Progress Notes (Signed)
Clinical Summary Gina [name] is a 77 y.o.female seen today for follow up of the following medical problems.   1. Chronic systolic HF - 93/8101 echo LVEF 75-10%, grade I diastolic dysfunction. Multiple WMAs.  - echo 10/2010 LVEF 40-45%, multiple WMAs.  - cath 2009 patent coronaries -echo 03/2015 LVEF 20-25% - echo 07/2015 LVEF 20-25% - from records appears she had severe LV systolic dysfunction several years ago (around 2009) that improved, but most recently has decreased again. - cath 08/2015 with patent coronaries - remains hesitant about ICD.   01/2017 admission with pulmonary edema, intubated Repeat admit 12/18, sudden onset SOB. Recurrent chf.  - taking lasix 80mg  and 20mg  in PM. Working well for her.  - she reports Cr 1.68 by pcp lab 06/2017.   2. HTN - we stopped her norvasc due to low bp's in the past - compliant with meds  3. OSA - compliant with cpap   4. Obesity - rejoined weight watchers.  - recent 16 lbs weight loss    Past Medical History:  Diagnosis Date  . Acute renal failure [facility])     hospitalized... December 08, 2009... improved with hydration in the hospital  . Anemia    further workup needed... October, 2011.  [name] Aortic insufficiency    mild...echo... June 2 010  . Arthritis    Hips/Knees, severe, requiring a walker  . Asymmetric septal hypertrophy (HCC)    echo....04/2009....patient encouraged the family to be screened elsewhere  . Atrial fibrillation ([place])     ??? atrial fibrillation during hospitalization October, 2011 ???..  . Bradycardia   . CAD (coronary artery disease)    Mild coronary disease, catheterization, 2009  . Cardiomyopathy, nonischemic ([place])    EF improved October, 2011  . Ejection fraction    EF 25%...nondiagnostic MRI December 2009...  /  echo June, 2010  /   echo... December 08, 2009.... ejection fraction improved... EF 60% /  EF 30%... echo.... [place]... December 28, 2009 with CHFPLanned ICD / CRT... patient has  seen Dr.Klein..EF improved October, 2011   . Groin hematoma    Right-hematoma/abscess..repaired.. December, 2009  . Hypertension   . LBBB (left bundle Mariangel Ringley block)   . LVH (left ventricular hypertrophy)    moderately severe... echo.. June, 2010  /  EF improved October, 2011.... cancel plans for ICD  . Mitral regurgitation    mild/ moderate...echo June, 2010  /   echo.. October, 2011.... mitral regurgitation  improved  . OSA (obstructive sleep apnea)   . Renal artery stenosis (HCC)    80% upper left   . Systolic heart failure    chronic     Allergies  Allergen Reactions  . Cephalexin     REACTION: Rash to arms/legs     Current Outpatient Medications  Medication Sig Dispense Refill  . aspirin 81 MG tablet Take 81 mg by mouth daily.      . calcitRIOL (ROCALTROL) 0.25 MCG capsule Take 0.25 mcg by mouth daily.    . carvedilol (COREG) 6.25 MG tablet Take 1 tablet (6.25 mg total) by mouth 2 (two) times daily. 60 tablet 0  . docusate sodium (COLACE) 100 MG capsule Take 100 mg by mouth daily.    [name] ENTRESTO 49-51 MG TAKE 1 TABLET BY MOUTH TWICE DAILY 60 tablet 6  . ferrous sulfate 325 (65 FE) MG tablet Take 325 mg by mouth daily.      . furosemide (LASIX) 20 MG tablet TAKE 1 TABLET BY MOUTH  EVERY OTHER DAY ALTERNATING WITH 40 MG 45 tablet 1  . furosemide (LASIX) 40 MG tablet Take 40 mg by mouth every other day. Alternating with the 20mg     . hydrALAZINE (APRESOLINE) 50 MG tablet TAKE 1 TABLET BY MOUTH THREE TIMES DAILY* PT NEEDS APPOINTMENT FOR FURTHER REFILLS* 90 tablet 0  . isosorbide mononitrate (IMDUR) 60 MG 24 hr tablet TAKE 1 TABLET BY MOUTH EVERY DAY 30 tablet 0  . Multiple Vitamin (MULTIVITAMIN) tablet Take 1 tablet by mouth daily.    . Multiple Vitamins-Minerals (ICAPS PO) Take 1 capsule by mouth daily.    . sacubitril-valsartan (ENTRESTO) 49-51 MG Take 1 tablet by mouth 2 (two) times daily. 42 tablet 0  . spironolactone (ALDACTONE) 25 MG tablet TAKE 1 TABLET(25 MG) BY MOUTH  DAILY 30 tablet 6   No current facility-administered medications for this visit.      Past Surgical History:  Procedure Laterality Date  . ABDOMINAL HYSTERECTOMY    . CARDIAC CATHETERIZATION N/A 08/14/2015   Procedure: Right/Left Heart Cath and Coronary Angiography;  Surgeon: Leonie Man, MD;  Location: [facility];  Service: Cardiovascular;  Laterality: N/A;  . Surgical Repair of a Catheteriztion associated femoral artery injury       Allergies  Allergen Reactions  . Cephalexin     REACTION: Rash to arms/legs      Family History  Problem Relation Age of Onset  . Rheumatic fever Mother        in her early 55's  . Alzheimer's disease Mother   . Stomach cancer Father 67       died 49  . Bradycardia Brother        has pacemaker  . Heart failure Paternal Grandmother   . Heart failure Paternal Grandfather      Social History Gina [name] reports that she quit smoking about 31 years ago. Her smoking use included cigarettes. She started smoking about 58 years ago. She has a 24.00 pack-year smoking history. She has never used smokeless tobacco. Gina [name] reports that she does not drink alcohol.   Review of Systems CONSTITUTIONAL: No weight loss, fever, chills, weakness or fatigue.  HEENT: Eyes: No visual loss, blurred vision, double vision or yellow sclerae.No hearing loss, sneezing, congestion, runny nose or sore throat.  SKIN: No rash or itching.  CARDIOVASCULAR: per hpi RESPIRATORY: No shortness of breath, cough or sputum.  GASTROINTESTINAL: No anorexia, nausea, vomiting or diarrhea. No abdominal pain or blood.  GENITOURINARY: No burning on urination, no polyuria NEUROLOGICAL: No headache, dizziness, syncope, paralysis, ataxia, numbness or tingling in the extremities. No change in bowel or bladder control.  MUSCULOSKELETAL: No muscle, back pain, joint pain or stiffness.  LYMPHATICS: No enlarged nodes. No history of splenectomy.  PSYCHIATRIC: No history of  depression or anxiety.  ENDOCRINOLOGIC: No reports of sweating, cold or heat intolerance. No polyuria or polydipsia.  [name]   Physical Examination Vitals:   06/16/17 1303  BP: 140/74  Pulse: 60  SpO2: 97%   Vitals:   06/16/17 1303  Weight: 205 lb (93 kg)  Height: 5\' 4"  (1.626 m)    Gen: resting comfortably, no acute distress HEENT: no scleral icterus, pupils equal round and reactive, no palptable cervical adenopathy,  CV: RRR, no m/r/g, no jvd Resp: Clear to auscultation bilaterally GI: abdomen is soft, non-tender, non-distended, normal bowel sounds, no hepatosplenomegaly MSK: extremities are warm, no edema.  Skin: warm, no rash Neuro:  no focal deficits Psych: appropriate affect  Diagnostic Studies 02/2008 cath FINDINGS: Hemodynamics: Mean right atrial pressure was 13 mmHg. RV 43/9, PA 47/28 with mean PA pressure of 37 mmHg. Mean pulmonary capillary wedge pressure of 23 mmHg. Aorta 181/90. Left ventricle 177/17/26. Mixed venous O2 saturation was 65%, aortic saturation 92%, SVR was 1355, cardiac output was 6.55 liters per minute. Cardiac index was 3.45. Left ventriculography: Left ventricle was enlarged. There was global severe hypokinesis with an estimated ejection fraction of 25%. Coronary angiography: The right coronary artery was dominant and showed only luminal irregularities. Left main coronary artery was free from significant disease. The circumflex was a large vessel, there were 3 obtuse marginal vessels with a 30% ostial stenosis of the first obtuse marginal. There was a 30% stenosis in the proximal circumflex between the first and second obtuse marginals. The LAD system actually was a dual LAD type system, with a large diagonal that reached the apex. There is a mild 20% stenosis, just prior to the bifurcation of the large first diagonal off the LAD. The remainder of the LAD did not have significant disease. There were luminal  irregularities in the large first diagonal. This vessel reached the apex. Non-selective abdominal aortography did show 2 right and 2 left renal arteries, there appeared to be an 80% ostial stenosis of the upper left renal artery. This was confirmed by a selective renal angiography.   02/2014 echo Study Conclusions  - Left ventricle: The cavity size was normal. Wall thickness was increased in a pattern of moderate LVH. Sigmoid shaped basal septum. Systolic function was severely reduced. The estimated ejection fraction was in the range of 25% to 30%. Diffuse hypokinesis. There is severe hypokinesis of the mid-apicalanteroseptal, anterior, and apical myocardium. Doppler parameters are consistent with abnormal left ventricular relaxation (grade 1 diastolic dysfunction). - Ventricular septum: Septal motion showed abnormal function and dyssynergy. - Aortic valve: Mildly calcified annulus. Trileaflet. There was trivial regurgitation. - Ascending aorta: The ascending aorta was mildly dilated, 43 mm. - Mitral valve: Calcified annulus. There was trivial regurgitation. - Left atrium: The atrium was severely dilated. - Right atrium: Central venous pressure (est): 3 mm Hg. - Atrial septum: No defect or patent foramen ovale was identified. - Tricuspid valve: There was trivial regurgitation. - Pulmonary arteries: Systolic pressure could not be accurately estimated. - Pericardium, extracardiac: There was no pericardial effusion.  Impressions:  - Limited images. Unable to compare with prior study. Moderate LVH with sigmoid shaped basal septum, LVEF approximately 25-30% with diffuse hypokinesis, most severe mid to apical anteroseptal and anterior wall. Septal dyssynegry. Grade 1 diastolic dysfunction. Severe left atrial enlargement. MIldly dilated ascending aorta. Trivial aortic regurgitation. Trivial tricuspid regurgitation, unable to assess  PASP.  Jan 2017 echo Study Conclusions  - Left ventricle: The cavity size was normal. Wall thickness was increased in a pattern of severe LVH. Systolic function was severely reduced. The estimated ejection fraction was in the range of 20% to 25%. Diffuse hypokinesis. Doppler parameters are consistent with abnormal left ventricular relaxation (grade 1 diastolic dysfunction). - Aortic valve: Mildly calcified annulus. Mildly thickened leaflets. Valve area (VTI): 2.43 cm^2. Valve area (Vmax): 2.39 cm^2. Valve area (Vmean): 2.28 cm^2. - Mitral valve: Mildly calcified annulus. Mildly thickened leaflets . There was mild regurgitation. - Left atrium: The atrium was moderately dilated. - Technically adequate study.  08/2015 Cath 1. Angiographically normal coronary arteries 2. Nonischemic cardiomyopathy - conflicting cardiac outputs between Fick and thermodilution. Suspect that there is some valvular disease that is throwing off the thermodilution. 3.  Essentially normal right heart pressures. Mildly elevated LVEDP and PCWP, but otherwise normal pressures.   Clearly nonischemic cardiomyopathy. The patient's itching anatomy with a tandem LAD system and codominant.  Plan:  Discharge after bedrest following femoral sheath removal.  Continue current management. She appears euvolemic       Assessment and Plan  1. Chronic systolic heart failure - NICM, LVEF 20-25%, she is NYHA III. She remains not interested in ICD, if reconsiders consider CRT ICD given her LBBB - currently euvolemic without significant symptoms, continue current meds   2. HTN - mildly elevated in clinic, prior issues with symptomatic low bp's - continue current meds  3. OSA - continue CPAP          Arnoldo Lenis, M.D.

## 2017-06-23 ENCOUNTER — Encounter: Payer: Self-pay | Admitting: Cardiology

## 2017-06-25 ENCOUNTER — Other Ambulatory Visit: Payer: Self-pay | Admitting: Cardiology

## 2017-07-15 DIAGNOSIS — I509 Heart failure, unspecified: Secondary | ICD-10-CM | POA: Diagnosis not present

## 2017-07-15 DIAGNOSIS — M159 Polyosteoarthritis, unspecified: Secondary | ICD-10-CM | POA: Diagnosis not present

## 2017-07-15 DIAGNOSIS — I10 Essential (primary) hypertension: Secondary | ICD-10-CM | POA: Diagnosis not present

## 2017-07-22 ENCOUNTER — Other Ambulatory Visit: Payer: Self-pay | Admitting: Cardiology

## 2017-08-03 DIAGNOSIS — R300 Dysuria: Secondary | ICD-10-CM | POA: Diagnosis not present

## 2017-08-03 DIAGNOSIS — R350 Frequency of micturition: Secondary | ICD-10-CM | POA: Diagnosis not present

## 2017-08-06 DIAGNOSIS — R0600 Dyspnea, unspecified: Secondary | ICD-10-CM | POA: Diagnosis not present

## 2017-08-06 DIAGNOSIS — E669 Obesity, unspecified: Secondary | ICD-10-CM | POA: Diagnosis present

## 2017-08-06 DIAGNOSIS — Z1612 Extended spectrum beta lactamase (ESBL) resistance: Secondary | ICD-10-CM | POA: Diagnosis not present

## 2017-08-06 DIAGNOSIS — I1310 Hypertensive heart and chronic kidney disease without heart failure, with stage 1 through stage 4 chronic kidney disease, or unspecified chronic kidney disease: Secondary | ICD-10-CM | POA: Diagnosis not present

## 2017-08-06 DIAGNOSIS — Z881 Allergy status to other antibiotic agents status: Secondary | ICD-10-CM | POA: Diagnosis not present

## 2017-08-06 DIAGNOSIS — R55 Syncope and collapse: Secondary | ICD-10-CM | POA: Diagnosis not present

## 2017-08-06 DIAGNOSIS — I110 Hypertensive heart disease with heart failure: Secondary | ICD-10-CM | POA: Diagnosis present

## 2017-08-06 DIAGNOSIS — I5023 Acute on chronic systolic (congestive) heart failure: Secondary | ICD-10-CM | POA: Diagnosis not present

## 2017-08-06 DIAGNOSIS — I5021 Acute systolic (congestive) heart failure: Secondary | ICD-10-CM | POA: Diagnosis not present

## 2017-08-06 DIAGNOSIS — Z7982 Long term (current) use of aspirin: Secondary | ICD-10-CM | POA: Diagnosis not present

## 2017-08-06 DIAGNOSIS — J189 Pneumonia, unspecified organism: Secondary | ICD-10-CM | POA: Diagnosis not present

## 2017-08-06 DIAGNOSIS — N183 Chronic kidney disease, stage 3 (moderate): Secondary | ICD-10-CM | POA: Diagnosis present

## 2017-08-06 DIAGNOSIS — N179 Acute kidney failure, unspecified: Secondary | ICD-10-CM | POA: Diagnosis present

## 2017-08-06 DIAGNOSIS — I130 Hypertensive heart and chronic kidney disease with heart failure and stage 1 through stage 4 chronic kidney disease, or unspecified chronic kidney disease: Secondary | ICD-10-CM | POA: Diagnosis not present

## 2017-08-06 DIAGNOSIS — Z452 Encounter for adjustment and management of vascular access device: Secondary | ICD-10-CM | POA: Diagnosis not present

## 2017-08-06 DIAGNOSIS — I5020 Unspecified systolic (congestive) heart failure: Secondary | ICD-10-CM | POA: Diagnosis not present

## 2017-08-06 DIAGNOSIS — N19 Unspecified kidney failure: Secondary | ICD-10-CM | POA: Diagnosis not present

## 2017-08-06 DIAGNOSIS — Z79899 Other long term (current) drug therapy: Secondary | ICD-10-CM | POA: Diagnosis not present

## 2017-08-06 DIAGNOSIS — A498 Other bacterial infections of unspecified site: Secondary | ICD-10-CM | POA: Diagnosis not present

## 2017-08-06 DIAGNOSIS — R079 Chest pain, unspecified: Secondary | ICD-10-CM | POA: Diagnosis not present

## 2017-08-06 DIAGNOSIS — E876 Hypokalemia: Secondary | ICD-10-CM | POA: Diagnosis not present

## 2017-08-06 DIAGNOSIS — R7989 Other specified abnormal findings of blood chemistry: Secondary | ICD-10-CM | POA: Diagnosis present

## 2017-08-06 DIAGNOSIS — Z9889 Other specified postprocedural states: Secondary | ICD-10-CM | POA: Diagnosis not present

## 2017-08-06 DIAGNOSIS — I9589 Other hypotension: Secondary | ICD-10-CM | POA: Diagnosis not present

## 2017-08-06 DIAGNOSIS — N390 Urinary tract infection, site not specified: Secondary | ICD-10-CM | POA: Diagnosis not present

## 2017-08-06 DIAGNOSIS — I509 Heart failure, unspecified: Secondary | ICD-10-CM | POA: Diagnosis not present

## 2017-08-06 DIAGNOSIS — I4891 Unspecified atrial fibrillation: Secondary | ICD-10-CM | POA: Diagnosis not present

## 2017-08-06 DIAGNOSIS — E860 Dehydration: Secondary | ICD-10-CM | POA: Diagnosis not present

## 2017-08-14 ENCOUNTER — Telehealth: Payer: Self-pay | Admitting: *Deleted

## 2017-08-14 ENCOUNTER — Encounter: Payer: Self-pay | Admitting: *Deleted

## 2017-08-14 DIAGNOSIS — Z1612 Extended spectrum beta lactamase (ESBL) resistance: Secondary | ICD-10-CM | POA: Diagnosis not present

## 2017-08-14 DIAGNOSIS — I130 Hypertensive heart and chronic kidney disease with heart failure and stage 1 through stage 4 chronic kidney disease, or unspecified chronic kidney disease: Secondary | ICD-10-CM | POA: Diagnosis not present

## 2017-08-14 DIAGNOSIS — Z9981 Dependence on supplemental oxygen: Secondary | ICD-10-CM | POA: Diagnosis not present

## 2017-08-14 DIAGNOSIS — Z7901 Long term (current) use of anticoagulants: Secondary | ICD-10-CM | POA: Diagnosis not present

## 2017-08-14 DIAGNOSIS — Z452 Encounter for adjustment and management of vascular access device: Secondary | ICD-10-CM | POA: Diagnosis not present

## 2017-08-14 DIAGNOSIS — I5023 Acute on chronic systolic (congestive) heart failure: Secondary | ICD-10-CM | POA: Diagnosis not present

## 2017-08-14 DIAGNOSIS — E669 Obesity, unspecified: Secondary | ICD-10-CM | POA: Diagnosis not present

## 2017-08-14 DIAGNOSIS — Z6837 Body mass index (BMI) 37.0-37.9, adult: Secondary | ICD-10-CM | POA: Diagnosis not present

## 2017-08-14 DIAGNOSIS — I4891 Unspecified atrial fibrillation: Secondary | ICD-10-CM | POA: Diagnosis not present

## 2017-08-14 DIAGNOSIS — N390 Urinary tract infection, site not specified: Secondary | ICD-10-CM | POA: Diagnosis not present

## 2017-08-14 DIAGNOSIS — N183 Chronic kidney disease, stage 3 (moderate): Secondary | ICD-10-CM | POA: Diagnosis not present

## 2017-08-14 DIAGNOSIS — M109 Gout, unspecified: Secondary | ICD-10-CM | POA: Diagnosis not present

## 2017-08-14 NOTE — Telephone Encounter (Signed)
I can see patient tomorrow at 940 AM if she can come to [facility]

## 2017-08-14 NOTE — Telephone Encounter (Signed)
Pt was recently at [facility] for CHF and was told to stop lasix. Pt has gained 10 lbs in a week (was given fluids while admitted per pt) and was concerned about not taking diuretic and weight gain. Pt has f/u with Dr Harl Bowie 7/2 and nothing sooner available nor with extender - will forward to provider and request notes from [facility]

## 2017-08-14 NOTE — Telephone Encounter (Signed)
Pt agreeable to 940 appt tomorrow with Dr Marlena Clipper

## 2017-08-15 ENCOUNTER — Ambulatory Visit (INDEPENDENT_AMBULATORY_CARE_PROVIDER_SITE_OTHER): Payer: Medicare Other | Admitting: Cardiology

## 2017-08-15 ENCOUNTER — Other Ambulatory Visit: Payer: Self-pay

## 2017-08-15 ENCOUNTER — Encounter: Payer: Self-pay | Admitting: Cardiology

## 2017-08-15 VITALS — BP 102/70 | HR 73 | Ht 64.0 in | Wt 226.0 lb

## 2017-08-15 DIAGNOSIS — I10 Essential (primary) hypertension: Secondary | ICD-10-CM

## 2017-08-15 DIAGNOSIS — I428 Other cardiomyopathies: Secondary | ICD-10-CM

## 2017-08-15 DIAGNOSIS — I480 Paroxysmal atrial fibrillation: Secondary | ICD-10-CM

## 2017-08-15 DIAGNOSIS — I5023 Acute on chronic systolic (congestive) heart failure: Secondary | ICD-10-CM

## 2017-08-15 MED ORDER — APIXABAN 5 MG PO TABS: 5.0000 mg | ORAL_TABLET | Freq: Two times a day (BID) | ORAL | 3 refills | Status: DC

## 2017-08-15 MED ORDER — FUROSEMIDE 80 MG PO TABS: ORAL_TABLET | ORAL | 1 refills | Status: DC

## 2017-08-15 MED ORDER — CARVEDILOL 12.5 MG PO TABS: 18.7500 mg | ORAL_TABLET | Freq: Two times a day (BID) | ORAL | 1 refills | Status: DC

## 2017-08-15 MED ORDER — FUROSEMIDE 20 MG PO TABS: ORAL_TABLET | ORAL | 1 refills | Status: DC

## 2017-08-15 NOTE — Progress Notes (Addendum)
Clinical Summary Ms. Bach is a 77 y.o.female seen today for follow up of the following medical problems. T  1. Chronic systolic HF - 31/4970 echo LVEF 26-37%, grade I diastolic dysfunction. Multiple WMAs.  - echo 10/2010 LVEF 40-45%, multiple WMAs.  - cath 2009 patent coronaries -echo 03/2015 LVEF 20-25% - echo 07/2015 LVEF 20-25% - from records appears she had severe LV systolic dysfunction several years ago (around 2009) that improved, but most recently has decreased again. - cath 08/2015 with patent coronaries - remains hesitant about ICD.   01/2017 admission with pulmonary edema, intubated Repeat admit 12/18, sudden onset SOB. Recurrent chf.  - taking lasix 80mg  and 20mg  in PM. Working well for her.  - she reports Cr 1.68 by pcp lab 06/2017.    [place] admission - admit June 5 to June 12th at [facility]. Presented with UTI symptoms and SOB, elevated heart rates by home monitor up to 120 with palpitations.  - treated for acute on chronic systolic HF, UTI - echo 10/5883 [place]: LVEF 20-25%, moderate RV dysfunction, mild to mod MR, mild to mod TR - diuretics held at discharge due to AKI (aldactone, lasix). Cr 2.02 on admit. Discharge Cr on 6/12 was 1.74 and BUN 65. In April she was 1.6 and BUN 42. Has been on lasix 80mg  in AM and 20mg  in PM - hydralazine was stopped due to soft bps  - weight up to 226 lbs today, was 205 lbs in April. Home scale 221.  -toward end of May just under 200 lbs - took lasix 40mg  yesterday    3. Afib - noted during recent [facility] admission 08/2017. New diagnosis for her that time.  - started on cardizem during that admission - started on eliquis 2.5mg  bid at that time     3. HTN - we stopped her norvasc due to low bp's in the past - she is compliant with meds  4. OSA - compliant with cpap    Past Medical History:  Diagnosis Date  . Acute renal failure [facility])     hospitalized... December 08, 2009... improved with hydration in  the hospital  . Anemia    further workup needed... October, 2011.  [name] Aortic insufficiency    mild...echo... June 2 010  . Arthritis    Hips/Knees, severe, requiring a walker  . Asymmetric septal hypertrophy (HCC)    echo....04/2009....patient encouraged the family to be screened elsewhere  . Atrial fibrillation ([place])     ??? atrial fibrillation during hospitalization October, 2011 ???..  . Bradycardia   . CAD (coronary artery disease)    Mild coronary disease, catheterization, 2009  . Cardiomyopathy, nonischemic ([place])    EF improved October, 2011  . Ejection fraction    EF 25%...nondiagnostic MRI December 2009...  /  echo June, 2010  /   echo... December 08, 2009.... ejection fraction improved... EF 60% /  EF 30%... echo.... [place]... December 28, 2009 with CHFPLanned ICD / CRT... patient has seen Dr.Klein..EF improved October, 2011   . Groin hematoma    Right-hematoma/abscess..repaired.. December, 2009  . Hypertension   . LBBB (left bundle branch block)   . LVH (left ventricular hypertrophy)    moderately severe... echo.. June, 2010  /  EF improved October, 2011.... cancel plans for ICD  . Mitral regurgitation    mild/ moderate...echo June, 2010  /   echo.. October, 2011.... mitral regurgitation  improved  . OSA (obstructive sleep apnea)   . Renal artery stenosis (  [place])    80% upper left   . Systolic heart failure    chronic     Allergies  Allergen Reactions  . Cephalexin     REACTION: Rash to arms/legs     Current Outpatient Medications  Medication Sig Dispense Refill  . aspirin 81 MG tablet Take 81 mg by mouth daily.      . carvedilol (COREG) 6.25 MG tablet TAKE 1 TABLET(6.25 MG) BY MOUTH TWICE DAILY 60 tablet 3  . docusate sodium (COLACE) 100 MG capsule Take 100 mg by mouth daily.    [name] ENTRESTO 49-51 MG TAKE 1 TABLET BY MOUTH TWICE DAILY 60 tablet 6  . ferrous sulfate 325 (65 FE) MG tablet Take 325 mg by mouth daily.      . furosemide (LASIX) 20 MG tablet Take 20  mg by mouth every evening.    . furosemide (LASIX) 40 MG tablet Take 80 mg by mouth every morning. Alternating with the 20mg     . hydrALAZINE (APRESOLINE) 50 MG tablet TAKE 1 TABLET BY MOUTH THREE TIMES DAILY 90 tablet 2  . isosorbide mononitrate (IMDUR) 60 MG 24 hr tablet TAKE 1 TABLET BY MOUTH EVERY DAY 90 tablet 0  . Multiple Vitamin (MULTIVITAMIN) tablet Take 1 tablet by mouth daily.    . Multiple Vitamins-Minerals (ICAPS PO) Take 1 capsule by mouth daily.    . potassium chloride (K-DUR,KLOR-CON) 10 MEQ tablet Take 10 mEq by mouth daily.    [name] spironolactone (ALDACTONE) 25 MG tablet TAKE 1 TABLET(25 MG) BY MOUTH DAILY 30 tablet 6   No current facility-administered medications for this visit.      Past Surgical History:  Procedure Laterality Date  . ABDOMINAL HYSTERECTOMY    . CARDIAC CATHETERIZATION N/A 08/14/2015   Procedure: Right/Left Heart Cath and Coronary Angiography;  Surgeon: Leonie Man, MD;  Location: [facility];  Service: Cardiovascular;  Laterality: N/A;  . Surgical Repair of a Catheteriztion associated femoral artery injury       Allergies  Allergen Reactions  . Cephalexin     REACTION: Rash to arms/legs      Family History  Problem Relation Age of Onset  . Rheumatic fever Mother        in her early 72's  . Alzheimer's disease Mother   . Stomach cancer Father 67       died 65  . Bradycardia Brother        has pacemaker  . Heart failure Paternal Grandmother   . Heart failure Paternal Grandfather      Social History Ms. Vessel reports that she quit smoking about 31 years ago. Her smoking use included cigarettes. She started smoking about 58 years ago. She has a 24.00 pack-year smoking history. She has never used smokeless tobacco. Ms. Passmore reports that she does not drink alcohol.   Review of Systems CONSTITUTIONAL: No weight loss, fever, chills, weakness or fatigue.  HEENT: Eyes: No visual loss, blurred vision, double vision or yellow  sclerae.No hearing loss, sneezing, congestion, runny nose or sore throat.  SKIN: No rash or itching.  CARDIOVASCULAR: per hpi RESPIRATORY: per hpi GASTROINTESTINAL: No anorexia, nausea, vomiting or diarrhea. No abdominal pain or blood.  GENITOURINARY: No burning on urination, no polyuria NEUROLOGICAL: No headache, dizziness, syncope, paralysis, ataxia, numbness or tingling in the extremities. No change in bowel or bladder control.  MUSCULOSKELETAL: No muscle, back pain, joint pain or stiffness.  LYMPHATICS: No enlarged nodes. No history of splenectomy.  PSYCHIATRIC: No history  of depression or anxiety.  ENDOCRINOLOGIC: No reports of sweating, cold or heat intolerance. No polyuria or polydipsia.  [name]   Physical Examination Vitals:   08/15/17 1006  BP: 102/70  Pulse: 73  SpO2: 98%   Vitals:   08/15/17 1006  Weight: 226 lb (102.5 kg)  Height: 5\' 4"  (1.626 m)    Gen: resting comfortably, no acute distress HEENT: no scleral icterus, pupils equal round and reactive, no palptable cervical adenopathy,  CV: RRR, no mr/g/,no jvd Resp: Clear to auscultation bilaterally GI: abdomen is soft, non-tender, non-distended, normal bowel sounds, no hepatosplenomegaly MSK: extremities are warm, 1+ bilateral LE edema  Skin: warm, no rash Neuro:  no focal deficits Psych: appropriate affect   Diagnostic Studies  02/2008 cath FINDINGS: Hemodynamics: Mean right atrial pressure was 13 mmHg. RV 43/9, PA 47/28 with mean PA pressure of 37 mmHg. Mean pulmonary capillary wedge pressure of 23 mmHg. Aorta 181/90. Left ventricle 177/17/26. Mixed venous O2 saturation was 65%, aortic saturation 92%, SVR was 1355, cardiac output was 6.55 liters per minute. Cardiac index was 3.45. Left ventriculography: Left ventricle was enlarged. There was global severe hypokinesis with an estimated ejection fraction of 25%. Coronary angiography: The right coronary artery was dominant and showed only  luminal irregularities. Left main coronary artery was free from significant disease. The circumflex was a large vessel, there were 3 obtuse marginal vessels with a 30% ostial stenosis of the first obtuse marginal. There was a 30% stenosis in the proximal circumflex between the first and second obtuse marginals. The LAD system actually was a dual LAD type system, with a large diagonal that reached the apex. There is a mild 20% stenosis, just prior to the bifurcation of the large first diagonal off the LAD. The remainder of the LAD did not have significant disease. There were luminal irregularities in the large first diagonal. This vessel reached the apex. Non-selective abdominal aortography did show 2 right and 2 left renal arteries, there appeared to be an 80% ostial stenosis of the upper left renal artery. This was confirmed by a selective renal angiography.   02/2014 echo Study Conclusions  - Left ventricle: The cavity size was normal. Wall thickness was increased in a pattern of moderate LVH. Sigmoid shaped basal septum. Systolic function was severely reduced. The estimated ejection fraction was in the range of 25% to 30%. Diffuse hypokinesis. There is severe hypokinesis of the mid-apicalanteroseptal, anterior, and apical myocardium. Doppler parameters are consistent with abnormal left ventricular relaxation (grade 1 diastolic dysfunction). - Ventricular septum: Septal motion showed abnormal function and dyssynergy. - Aortic valve: Mildly calcified annulus. Trileaflet. There was trivial regurgitation. - Ascending aorta: The ascending aorta was mildly dilated, 43 mm. - Mitral valve: Calcified annulus. There was trivial regurgitation. - Left atrium: The atrium was severely dilated. - Right atrium: Central venous pressure (est): 3 mm Hg. - Atrial septum: No defect or patent foramen ovale was identified. - Tricuspid valve: There was trivial  regurgitation. - Pulmonary arteries: Systolic pressure could not be accurately estimated. - Pericardium, extracardiac: There was no pericardial effusion.  Impressions:  - Limited images. Unable to compare with prior study. Moderate LVH with sigmoid shaped basal septum, LVEF approximately 25-30% with diffuse hypokinesis, most severe mid to apical anteroseptal and anterior wall. Septal dyssynegry. Grade 1 diastolic dysfunction. Severe left atrial enlargement. MIldly dilated ascending aorta. Trivial aortic regurgitation. Trivial tricuspid regurgitation, unable to assess PASP.  Jan 2017 echo Study Conclusions  - Left ventricle: The cavity size was normal.  Wall thickness was increased in a pattern of severe LVH. Systolic function was severely reduced. The estimated ejection fraction was in the range of 20% to 25%. Diffuse hypokinesis. Doppler parameters are consistent with abnormal left ventricular relaxation (grade 1 diastolic dysfunction). - Aortic valve: Mildly calcified annulus. Mildly thickened leaflets. Valve area (VTI): 2.43 cm^2. Valve area (Vmax): 2.39 cm^2. Valve area (Vmean): 2.28 cm^2. - Mitral valve: Mildly calcified annulus. Mildly thickened leaflets . There was mild regurgitation. - Left atrium: The atrium was moderately dilated. - Technically adequate study.  08/2015 Cath 1. Angiographically normal coronary arteries 2. Nonischemic cardiomyopathy - conflicting cardiac outputs between Fick and thermodilution. Suspect that there is some valvular disease that is throwing off the thermodilution. 3. Essentially normal right heart pressures. Mildly elevated LVEDP and PCWP, but otherwise normal pressures.   Clearly nonischemic cardiomyopathy. The patient's itching anatomy with a tandem LAD system and codominant.  Plan:  Discharge after bedrest following femoral sheath removal.  Continue current management. She appears  euvolemic    Assessment and Plan  1. Chronic systolic heart failure - recent admission with UTI and AKI, diuretics held at discharge - weight trending up since that time, increased SOB - restart lasix 80mg  in AM and 20mg  in PM, repeat labs. Update [place] in a few days on weight and swelling.    2. Afib - new diagnosis - based on her renal function, age, weight eliquis should be 5mg  bid - stop dilt in setting of systolic dysfunction. Increase coreg to 18.75mg  bid.  - ekg today shows she remains in afib rate controlled. May consider DCCV after 3 weeks of anticoag if remains in afib  3. OSA - continue CPAP  4. HTN - at goal, med changes as reported above due to afib and CHF.        Arnoldo Lenis, M.D.

## 2017-08-15 NOTE — Patient Instructions (Signed)
Your physician recommends that you schedule a follow-up appointment in: [place] has recommended you make the following change in your medication:   INCREASE ELIQUIS 5 MG TWICE DAILY  TAKE LASIX 80 MG IN THE MORNING AND 20 MG IN THE EVENING   INCREASE COREG 18.75 MG (1.5 TABLETS) TWICE DAILY   STOP CARDIZEM   Your physician recommends that you return for lab work CBC/BMP/TSH/MG  CALL us Tuesday WITH YOUR WEIGHTS  Thank you for choosing [facility]!!

## 2017-08-18 DIAGNOSIS — N183 Chronic kidney disease, stage 3 (moderate): Secondary | ICD-10-CM | POA: Diagnosis not present

## 2017-08-18 DIAGNOSIS — I130 Hypertensive heart and chronic kidney disease with heart failure and stage 1 through stage 4 chronic kidney disease, or unspecified chronic kidney disease: Secondary | ICD-10-CM | POA: Diagnosis not present

## 2017-08-18 DIAGNOSIS — I5023 Acute on chronic systolic (congestive) heart failure: Secondary | ICD-10-CM | POA: Diagnosis not present

## 2017-08-18 DIAGNOSIS — N390 Urinary tract infection, site not specified: Secondary | ICD-10-CM | POA: Diagnosis not present

## 2017-08-18 DIAGNOSIS — Z452 Encounter for adjustment and management of vascular access device: Secondary | ICD-10-CM | POA: Diagnosis not present

## 2017-08-18 DIAGNOSIS — Z1612 Extended spectrum beta lactamase (ESBL) resistance: Secondary | ICD-10-CM | POA: Diagnosis not present

## 2017-08-19 ENCOUNTER — Telehealth: Payer: Self-pay | Admitting: Cardiology

## 2017-08-19 NOTE — Telephone Encounter (Signed)
Patient notified.  Verbalized understanding. 

## 2017-08-19 NOTE — Telephone Encounter (Signed)
Patient called to report weights

## 2017-08-19 NOTE — Telephone Encounter (Signed)
Friday - 223.7 Saturday 223.7 Sunday 224.4 Monday 223.1 Tuesday 225.

## 2017-08-19 NOTE — Telephone Encounter (Signed)
Weights not coming down, I would increase her lasix to 80mg  am and 60mg  in PM. Update [place] again on Friday and we will decide from there any further dose changes  Zandra Abts MD

## 2017-08-20 DIAGNOSIS — I429 Cardiomyopathy, unspecified: Secondary | ICD-10-CM | POA: Diagnosis not present

## 2017-08-20 DIAGNOSIS — Z6837 Body mass index (BMI) 37.0-37.9, adult: Secondary | ICD-10-CM | POA: Diagnosis not present

## 2017-08-20 DIAGNOSIS — I509 Heart failure, unspecified: Secondary | ICD-10-CM | POA: Diagnosis not present

## 2017-08-20 DIAGNOSIS — I4891 Unspecified atrial fibrillation: Secondary | ICD-10-CM | POA: Diagnosis not present

## 2017-08-20 DIAGNOSIS — I10 Essential (primary) hypertension: Secondary | ICD-10-CM | POA: Diagnosis not present

## 2017-08-20 DIAGNOSIS — Z299 Encounter for prophylactic measures, unspecified: Secondary | ICD-10-CM | POA: Diagnosis not present

## 2017-08-21 DIAGNOSIS — Z452 Encounter for adjustment and management of vascular access device: Secondary | ICD-10-CM | POA: Diagnosis not present

## 2017-08-21 DIAGNOSIS — I420 Dilated cardiomyopathy: Secondary | ICD-10-CM | POA: Diagnosis not present

## 2017-08-21 DIAGNOSIS — I5023 Acute on chronic systolic (congestive) heart failure: Secondary | ICD-10-CM | POA: Diagnosis not present

## 2017-08-21 DIAGNOSIS — I130 Hypertensive heart and chronic kidney disease with heart failure and stage 1 through stage 4 chronic kidney disease, or unspecified chronic kidney disease: Secondary | ICD-10-CM | POA: Diagnosis not present

## 2017-08-21 DIAGNOSIS — N183 Chronic kidney disease, stage 3 (moderate): Secondary | ICD-10-CM | POA: Diagnosis not present

## 2017-08-21 DIAGNOSIS — Z1612 Extended spectrum beta lactamase (ESBL) resistance: Secondary | ICD-10-CM | POA: Diagnosis not present

## 2017-08-21 DIAGNOSIS — I428 Other cardiomyopathies: Secondary | ICD-10-CM | POA: Diagnosis not present

## 2017-08-21 DIAGNOSIS — N390 Urinary tract infection, site not specified: Secondary | ICD-10-CM | POA: Diagnosis not present

## 2017-08-22 ENCOUNTER — Telehealth: Payer: Self-pay | Admitting: Cardiology

## 2017-08-22 DIAGNOSIS — I5023 Acute on chronic systolic (congestive) heart failure: Secondary | ICD-10-CM | POA: Diagnosis not present

## 2017-08-22 DIAGNOSIS — N390 Urinary tract infection, site not specified: Secondary | ICD-10-CM | POA: Diagnosis not present

## 2017-08-22 DIAGNOSIS — I130 Hypertensive heart and chronic kidney disease with heart failure and stage 1 through stage 4 chronic kidney disease, or unspecified chronic kidney disease: Secondary | ICD-10-CM | POA: Diagnosis not present

## 2017-08-22 DIAGNOSIS — Z1612 Extended spectrum beta lactamase (ESBL) resistance: Secondary | ICD-10-CM | POA: Diagnosis not present

## 2017-08-22 DIAGNOSIS — Z452 Encounter for adjustment and management of vascular access device: Secondary | ICD-10-CM | POA: Diagnosis not present

## 2017-08-22 DIAGNOSIS — N183 Chronic kidney disease, stage 3 (moderate): Secondary | ICD-10-CM | POA: Diagnosis not present

## 2017-08-22 NOTE — Telephone Encounter (Signed)
Received call from Gina [name] [facility]) called. She is at patient's home stating that patient's weight continues to go up after taking extra fluid medication with shortness of breath.

## 2017-08-22 NOTE — Telephone Encounter (Signed)
SOB is about the same.  Was previously doing the Lasix 80 am & 60mg  pm.  No chest pain.    Saw pmd on Wednesday & he told her to increase the Lasix 80mg  twice a day x 2-3 days.  Did not call pmd back today.    Can't tell that this has helped any.    Suggested that she may call her pmd after hours since he made the most recent change or if symptoms worsen - can call for cardiology advice after hours as well.  Suggested ED also if symptoms worsen.  Patient stated that her nurse would be coming back out in the morning to see her again.

## 2017-08-22 NOTE — Telephone Encounter (Signed)
Patient called to report her weight.  Wednesday  223, Thursday 224.1 and Friday 225.3

## 2017-08-23 ENCOUNTER — Encounter: Payer: Self-pay | Admitting: Cardiology

## 2017-08-23 DIAGNOSIS — Z1612 Extended spectrum beta lactamase (ESBL) resistance: Secondary | ICD-10-CM | POA: Diagnosis not present

## 2017-08-23 DIAGNOSIS — I130 Hypertensive heart and chronic kidney disease with heart failure and stage 1 through stage 4 chronic kidney disease, or unspecified chronic kidney disease: Secondary | ICD-10-CM | POA: Diagnosis not present

## 2017-08-23 DIAGNOSIS — Z452 Encounter for adjustment and management of vascular access device: Secondary | ICD-10-CM | POA: Diagnosis not present

## 2017-08-23 DIAGNOSIS — N390 Urinary tract infection, site not specified: Secondary | ICD-10-CM | POA: Diagnosis not present

## 2017-08-23 DIAGNOSIS — N183 Chronic kidney disease, stage 3 (moderate): Secondary | ICD-10-CM | POA: Diagnosis not present

## 2017-08-23 DIAGNOSIS — I5023 Acute on chronic systolic (congestive) heart failure: Secondary | ICD-10-CM | POA: Diagnosis not present

## 2017-08-25 DIAGNOSIS — G4733 Obstructive sleep apnea (adult) (pediatric): Secondary | ICD-10-CM | POA: Diagnosis not present

## 2017-08-25 DIAGNOSIS — I4891 Unspecified atrial fibrillation: Secondary | ICD-10-CM | POA: Diagnosis present

## 2017-08-25 DIAGNOSIS — R600 Localized edema: Secondary | ICD-10-CM | POA: Diagnosis not present

## 2017-08-25 DIAGNOSIS — I5021 Acute systolic (congestive) heart failure: Secondary | ICD-10-CM | POA: Diagnosis not present

## 2017-08-25 DIAGNOSIS — E669 Obesity, unspecified: Secondary | ICD-10-CM | POA: Diagnosis present

## 2017-08-25 DIAGNOSIS — Z881 Allergy status to other antibiotic agents status: Secondary | ICD-10-CM | POA: Diagnosis not present

## 2017-08-25 DIAGNOSIS — I482 Chronic atrial fibrillation: Secondary | ICD-10-CM | POA: Diagnosis not present

## 2017-08-25 DIAGNOSIS — Z90710 Acquired absence of both cervix and uterus: Secondary | ICD-10-CM | POA: Diagnosis not present

## 2017-08-25 DIAGNOSIS — I447 Left bundle-branch block, unspecified: Secondary | ICD-10-CM | POA: Diagnosis not present

## 2017-08-25 DIAGNOSIS — N189 Chronic kidney disease, unspecified: Secondary | ICD-10-CM | POA: Diagnosis not present

## 2017-08-25 DIAGNOSIS — I1311 Hypertensive heart and chronic kidney disease without heart failure, with stage 5 chronic kidney disease, or end stage renal disease: Secondary | ICD-10-CM | POA: Diagnosis not present

## 2017-08-25 DIAGNOSIS — I5023 Acute on chronic systolic (congestive) heart failure: Secondary | ICD-10-CM | POA: Diagnosis not present

## 2017-08-25 DIAGNOSIS — R0600 Dyspnea, unspecified: Secondary | ICD-10-CM | POA: Diagnosis not present

## 2017-08-25 DIAGNOSIS — D638 Anemia in other chronic diseases classified elsewhere: Secondary | ICD-10-CM | POA: Diagnosis not present

## 2017-08-25 DIAGNOSIS — Z6838 Body mass index (BMI) 38.0-38.9, adult: Secondary | ICD-10-CM | POA: Diagnosis not present

## 2017-08-25 DIAGNOSIS — E876 Hypokalemia: Secondary | ICD-10-CM | POA: Diagnosis present

## 2017-08-25 DIAGNOSIS — Z7901 Long term (current) use of anticoagulants: Secondary | ICD-10-CM | POA: Diagnosis not present

## 2017-08-25 DIAGNOSIS — I509 Heart failure, unspecified: Secondary | ICD-10-CM | POA: Diagnosis not present

## 2017-08-25 DIAGNOSIS — I130 Hypertensive heart and chronic kidney disease with heart failure and stage 1 through stage 4 chronic kidney disease, or unspecified chronic kidney disease: Secondary | ICD-10-CM | POA: Diagnosis present

## 2017-08-25 DIAGNOSIS — R531 Weakness: Secondary | ICD-10-CM | POA: Diagnosis not present

## 2017-08-25 DIAGNOSIS — N183 Chronic kidney disease, stage 3 (moderate): Secondary | ICD-10-CM | POA: Diagnosis not present

## 2017-08-25 DIAGNOSIS — N179 Acute kidney failure, unspecified: Secondary | ICD-10-CM | POA: Diagnosis not present

## 2017-08-25 NOTE — Telephone Encounter (Signed)
Cecille Rubin with [facility] home [facility] calling back this morning - stating that patient spoke with Willadean Carol over the weekend & was advised to go to ED if weight cont'd to go up.  Lori back out to check on patient this morning & weight is going back up.  Wanted to let us know that patient is going to ED at [facility] today due to where she lives.

## 2017-08-25 NOTE — Telephone Encounter (Signed)
Ok, I agree with ER evaluation given her heart history and weight gain   Zandra Abts MD

## 2017-08-29 DIAGNOSIS — Z1612 Extended spectrum beta lactamase (ESBL) resistance: Secondary | ICD-10-CM | POA: Diagnosis not present

## 2017-08-29 DIAGNOSIS — N183 Chronic kidney disease, stage 3 (moderate): Secondary | ICD-10-CM | POA: Diagnosis not present

## 2017-08-29 DIAGNOSIS — I130 Hypertensive heart and chronic kidney disease with heart failure and stage 1 through stage 4 chronic kidney disease, or unspecified chronic kidney disease: Secondary | ICD-10-CM | POA: Diagnosis not present

## 2017-08-29 DIAGNOSIS — I5023 Acute on chronic systolic (congestive) heart failure: Secondary | ICD-10-CM | POA: Diagnosis not present

## 2017-08-29 DIAGNOSIS — Z452 Encounter for adjustment and management of vascular access device: Secondary | ICD-10-CM | POA: Diagnosis not present

## 2017-08-29 DIAGNOSIS — N390 Urinary tract infection, site not specified: Secondary | ICD-10-CM | POA: Diagnosis not present

## 2017-09-01 DIAGNOSIS — N183 Chronic kidney disease, stage 3 (moderate): Secondary | ICD-10-CM | POA: Diagnosis not present

## 2017-09-01 DIAGNOSIS — N390 Urinary tract infection, site not specified: Secondary | ICD-10-CM | POA: Diagnosis not present

## 2017-09-01 DIAGNOSIS — I5023 Acute on chronic systolic (congestive) heart failure: Secondary | ICD-10-CM | POA: Diagnosis not present

## 2017-09-01 DIAGNOSIS — Z452 Encounter for adjustment and management of vascular access device: Secondary | ICD-10-CM | POA: Diagnosis not present

## 2017-09-01 DIAGNOSIS — I130 Hypertensive heart and chronic kidney disease with heart failure and stage 1 through stage 4 chronic kidney disease, or unspecified chronic kidney disease: Secondary | ICD-10-CM | POA: Diagnosis not present

## 2017-09-01 DIAGNOSIS — Z1612 Extended spectrum beta lactamase (ESBL) resistance: Secondary | ICD-10-CM | POA: Diagnosis not present

## 2017-09-02 ENCOUNTER — Telehealth: Payer: Self-pay | Admitting: *Deleted

## 2017-09-02 ENCOUNTER — Emergency Department (HOSPITAL_COMMUNITY): Payer: Medicare Other

## 2017-09-02 ENCOUNTER — Other Ambulatory Visit: Payer: Self-pay

## 2017-09-02 ENCOUNTER — Inpatient Hospital Stay (HOSPITAL_COMMUNITY)
Admission: EM | Admit: 2017-09-02 | Discharge: 2017-09-23 | DRG: 242 | Disposition: A | Discharge status: Skilled Nursing Facility | Payer: Medicare Other | Attending: Cardiology | Admitting: Cardiology

## 2017-09-02 ENCOUNTER — Encounter (HOSPITAL_COMMUNITY): Payer: Self-pay | Admitting: Emergency Medicine

## 2017-09-02 DIAGNOSIS — K5900 Constipation, unspecified: Secondary | ICD-10-CM | POA: Diagnosis not present

## 2017-09-02 DIAGNOSIS — J90 Pleural effusion, not elsewhere classified: Secondary | ICD-10-CM | POA: Diagnosis not present

## 2017-09-02 DIAGNOSIS — Z87891 Personal history of nicotine dependence: Secondary | ICD-10-CM

## 2017-09-02 DIAGNOSIS — D62 Acute posthemorrhagic anemia: Secondary | ICD-10-CM

## 2017-09-02 DIAGNOSIS — Z6841 Body Mass Index (BMI) 40.0 and over, adult: Secondary | ICD-10-CM | POA: Diagnosis not present

## 2017-09-02 DIAGNOSIS — I10 Essential (primary) hypertension: Secondary | ICD-10-CM | POA: Diagnosis present

## 2017-09-02 DIAGNOSIS — G4733 Obstructive sleep apnea (adult) (pediatric): Secondary | ICD-10-CM | POA: Diagnosis not present

## 2017-09-02 DIAGNOSIS — Z90710 Acquired absence of both cervix and uterus: Secondary | ICD-10-CM

## 2017-09-02 DIAGNOSIS — Z96649 Presence of unspecified artificial hip joint: Secondary | ICD-10-CM | POA: Diagnosis present

## 2017-09-02 DIAGNOSIS — M170 Bilateral primary osteoarthritis of knee: Secondary | ICD-10-CM | POA: Diagnosis not present

## 2017-09-02 DIAGNOSIS — Z7982 Long term (current) use of aspirin: Secondary | ICD-10-CM

## 2017-09-02 DIAGNOSIS — Z96659 Presence of unspecified artificial knee joint: Secondary | ICD-10-CM | POA: Diagnosis present

## 2017-09-02 DIAGNOSIS — I428 Other cardiomyopathies: Secondary | ICD-10-CM | POA: Diagnosis not present

## 2017-09-02 DIAGNOSIS — I519 Heart disease, unspecified: Secondary | ICD-10-CM | POA: Diagnosis not present

## 2017-09-02 DIAGNOSIS — I5023 Acute on chronic systolic (congestive) heart failure: Secondary | ICD-10-CM | POA: Diagnosis not present

## 2017-09-02 DIAGNOSIS — I130 Hypertensive heart and chronic kidney disease with heart failure and stage 1 through stage 4 chronic kidney disease, or unspecified chronic kidney disease: Principal | ICD-10-CM | POA: Diagnosis present

## 2017-09-02 DIAGNOSIS — Z7401 Bed confinement status: Secondary | ICD-10-CM | POA: Diagnosis not present

## 2017-09-02 DIAGNOSIS — N183 Chronic kidney disease, stage 3 unspecified: Secondary | ICD-10-CM

## 2017-09-02 DIAGNOSIS — Z7901 Long term (current) use of anticoagulants: Secondary | ICD-10-CM | POA: Diagnosis not present

## 2017-09-02 DIAGNOSIS — M1A39X0 Chronic gout due to renal impairment, multiple sites, without tophus (tophi): Secondary | ICD-10-CM | POA: Diagnosis not present

## 2017-09-02 DIAGNOSIS — L03116 Cellulitis of left lower limb: Secondary | ICD-10-CM | POA: Diagnosis not present

## 2017-09-02 DIAGNOSIS — I4891 Unspecified atrial fibrillation: Secondary | ICD-10-CM | POA: Diagnosis present

## 2017-09-02 DIAGNOSIS — R609 Edema, unspecified: Secondary | ICD-10-CM | POA: Diagnosis not present

## 2017-09-02 DIAGNOSIS — I509 Heart failure, unspecified: Secondary | ICD-10-CM | POA: Diagnosis not present

## 2017-09-02 DIAGNOSIS — Z1612 Extended spectrum beta lactamase (ESBL) resistance: Secondary | ICD-10-CM | POA: Diagnosis not present

## 2017-09-02 DIAGNOSIS — N179 Acute kidney failure, unspecified: Secondary | ICD-10-CM

## 2017-09-02 DIAGNOSIS — Z950 Presence of cardiac pacemaker: Secondary | ICD-10-CM | POA: Diagnosis not present

## 2017-09-02 DIAGNOSIS — I2510 Atherosclerotic heart disease of native coronary artery without angina pectoris: Secondary | ICD-10-CM | POA: Diagnosis present

## 2017-09-02 DIAGNOSIS — N390 Urinary tract infection, site not specified: Secondary | ICD-10-CM | POA: Diagnosis not present

## 2017-09-02 DIAGNOSIS — I5043 Acute on chronic combined systolic (congestive) and diastolic (congestive) heart failure: Secondary | ICD-10-CM | POA: Diagnosis not present

## 2017-09-02 DIAGNOSIS — I480 Paroxysmal atrial fibrillation: Secondary | ICD-10-CM | POA: Diagnosis not present

## 2017-09-02 DIAGNOSIS — I110 Hypertensive heart disease with heart failure: Secondary | ICD-10-CM | POA: Diagnosis not present

## 2017-09-02 DIAGNOSIS — G4730 Sleep apnea, unspecified: Secondary | ICD-10-CM | POA: Diagnosis present

## 2017-09-02 DIAGNOSIS — B9629 Other Escherichia coli [E. coli] as the cause of diseases classified elsewhere: Secondary | ICD-10-CM | POA: Diagnosis not present

## 2017-09-02 DIAGNOSIS — Z79899 Other long term (current) drug therapy: Secondary | ICD-10-CM | POA: Diagnosis not present

## 2017-09-02 DIAGNOSIS — R0600 Dyspnea, unspecified: Secondary | ICD-10-CM | POA: Diagnosis not present

## 2017-09-02 DIAGNOSIS — Z881 Allergy status to other antibiotic agents status: Secondary | ICD-10-CM

## 2017-09-02 DIAGNOSIS — M6281 Muscle weakness (generalized): Secondary | ICD-10-CM | POA: Diagnosis not present

## 2017-09-02 DIAGNOSIS — N184 Chronic kidney disease, stage 4 (severe): Secondary | ICD-10-CM | POA: Diagnosis not present

## 2017-09-02 DIAGNOSIS — R570 Cardiogenic shock: Secondary | ICD-10-CM | POA: Diagnosis not present

## 2017-09-02 DIAGNOSIS — Z452 Encounter for adjustment and management of vascular access device: Secondary | ICD-10-CM | POA: Diagnosis not present

## 2017-09-02 DIAGNOSIS — I351 Nonrheumatic aortic (valve) insufficiency: Secondary | ICD-10-CM | POA: Diagnosis not present

## 2017-09-02 DIAGNOSIS — I442 Atrioventricular block, complete: Secondary | ICD-10-CM | POA: Diagnosis not present

## 2017-09-02 DIAGNOSIS — J9601 Acute respiratory failure with hypoxia: Secondary | ICD-10-CM

## 2017-09-02 DIAGNOSIS — Z6839 Body mass index (BMI) 39.0-39.9, adult: Secondary | ICD-10-CM | POA: Diagnosis not present

## 2017-09-02 DIAGNOSIS — R0602 Shortness of breath: Secondary | ICD-10-CM | POA: Diagnosis not present

## 2017-09-02 DIAGNOSIS — Z959 Presence of cardiac and vascular implant and graft, unspecified: Secondary | ICD-10-CM

## 2017-09-02 DIAGNOSIS — E669 Obesity, unspecified: Secondary | ICD-10-CM | POA: Diagnosis present

## 2017-09-02 DIAGNOSIS — D631 Anemia in chronic kidney disease: Secondary | ICD-10-CM | POA: Diagnosis not present

## 2017-09-02 DIAGNOSIS — E876 Hypokalemia: Secondary | ICD-10-CM | POA: Diagnosis not present

## 2017-09-02 DIAGNOSIS — B9620 Unspecified Escherichia coli [E. coli] as the cause of diseases classified elsewhere: Secondary | ICD-10-CM | POA: Diagnosis not present

## 2017-09-02 DIAGNOSIS — M5134 Other intervertebral disc degeneration, thoracic region: Secondary | ICD-10-CM | POA: Diagnosis not present

## 2017-09-02 DIAGNOSIS — I701 Atherosclerosis of renal artery: Secondary | ICD-10-CM | POA: Diagnosis present

## 2017-09-02 DIAGNOSIS — I447 Left bundle-branch block, unspecified: Secondary | ICD-10-CM | POA: Diagnosis not present

## 2017-09-02 DIAGNOSIS — I481 Persistent atrial fibrillation: Secondary | ICD-10-CM | POA: Diagnosis not present

## 2017-09-02 DIAGNOSIS — D649 Anemia, unspecified: Secondary | ICD-10-CM | POA: Diagnosis not present

## 2017-09-02 DIAGNOSIS — I959 Hypotension, unspecified: Secondary | ICD-10-CM | POA: Diagnosis not present

## 2017-09-02 DIAGNOSIS — E785 Hyperlipidemia, unspecified: Secondary | ICD-10-CM | POA: Diagnosis present

## 2017-09-02 DIAGNOSIS — M2550 Pain in unspecified joint: Secondary | ICD-10-CM | POA: Diagnosis not present

## 2017-09-02 DIAGNOSIS — R945 Abnormal results of liver function studies: Secondary | ICD-10-CM | POA: Diagnosis not present

## 2017-09-02 DIAGNOSIS — I340 Nonrheumatic mitral (valve) insufficiency: Secondary | ICD-10-CM | POA: Diagnosis not present

## 2017-09-02 DIAGNOSIS — R001 Bradycardia, unspecified: Secondary | ICD-10-CM | POA: Diagnosis not present

## 2017-09-02 DIAGNOSIS — J9690 Respiratory failure, unspecified, unspecified whether with hypoxia or hypercapnia: Secondary | ICD-10-CM

## 2017-09-02 DIAGNOSIS — R2689 Other abnormalities of gait and mobility: Secondary | ICD-10-CM | POA: Diagnosis not present

## 2017-09-02 DIAGNOSIS — I5189 Other ill-defined heart diseases: Secondary | ICD-10-CM

## 2017-09-02 DIAGNOSIS — I080 Rheumatic disorders of both mitral and aortic valves: Secondary | ICD-10-CM | POA: Diagnosis not present

## 2017-09-02 DIAGNOSIS — I517 Cardiomegaly: Secondary | ICD-10-CM | POA: Diagnosis not present

## 2017-09-02 LAB — CBC WITH DIFFERENTIAL/PLATELET
Basophils Absolute: 0 10*3/uL (ref 0.0–0.1)
Basophils Relative: 0 %
Eosinophils Absolute: 0.1 10*3/uL (ref 0.0–0.7)
Eosinophils Relative: 1 %
HCT: 36.6 % (ref 36.0–46.0)
Hemoglobin: 11.2 g/dL — ABNORMAL LOW (ref 12.0–15.0)
Lymphocytes Relative: 14 %
Lymphs Abs: 0.7 10*3/uL (ref 0.7–4.0)
MCH: 31.2 pg (ref 26.0–34.0)
MCHC: 30.6 g/dL (ref 30.0–36.0)
MCV: 101.9 fL — ABNORMAL HIGH (ref 78.0–100.0)
Monocytes Absolute: 0.5 10*3/uL (ref 0.1–1.0)
Monocytes Relative: 10 %
Neutro Abs: 3.9 10*3/uL (ref 1.7–7.7)
Neutrophils Relative %: 75 %
Platelets: 188 10*3/uL (ref 150–400)
RBC: 3.59 MIL/uL — ABNORMAL LOW (ref 3.87–5.11)
RDW: 20.9 % — ABNORMAL HIGH (ref 11.5–15.5)
WBC: 5.2 10*3/uL (ref 4.0–10.5)

## 2017-09-02 LAB — BASIC METABOLIC PANEL
Anion gap: 13 (ref 5–15)
BUN: 83 mg/dL — ABNORMAL HIGH (ref 8–23)
CO2: 24 mmol/L (ref 22–32)
Calcium: 8.9 mg/dL (ref 8.9–10.3)
Chloride: 103 mmol/L (ref 98–111)
Creatinine, Ser: 2.36 mg/dL — ABNORMAL HIGH (ref 0.44–1.00)
GFR calc Af Amer: 22 mL/min — ABNORMAL LOW (ref >60)
GFR calc non Af Amer: 19 mL/min — ABNORMAL LOW (ref >60)
Glucose, Bld: 138 mg/dL — ABNORMAL HIGH (ref 70–99)
Potassium: 3.8 mmol/L (ref 3.5–5.1)
Sodium: 140 mmol/L (ref 135–145)

## 2017-09-02 LAB — BRAIN NATRIURETIC PEPTIDE: B Natriuretic Peptide: >4500 pg/mL — ABNORMAL HIGH (ref 0.0–100.0)

## 2017-09-02 LAB — TROPONIN I
Troponin I: 0.05 ng/mL (ref <0.03)
Troponin I: 0.05 ng/mL (ref <0.03)

## 2017-09-02 MED ORDER — SODIUM CHLORIDE 0.9 % IV SOLN
250.0000 mL | INTRAVENOUS | Status: DC | PRN
  Administered 2017-09-10: 250 mL via INTRAVENOUS
  Administered 2017-09-14: 08:00:00 via INTRAVENOUS

## 2017-09-02 MED ORDER — APIXABAN 5 MG PO TABS
5.0000 mg | ORAL_TABLET | Freq: Two times a day (BID) | ORAL | Status: DC
  Administered 2017-09-02 – 2017-09-10 (×16): 5 mg via ORAL
  Filled 2017-09-02 (×16): qty 1

## 2017-09-02 MED ORDER — SODIUM CHLORIDE 0.9% FLUSH
3.0000 mL | Freq: Two times a day (BID) | INTRAVENOUS | Status: DC
  Administered 2017-09-02 – 2017-09-14 (×18): 3 mL via INTRAVENOUS

## 2017-09-02 MED ORDER — ONDANSETRON HCL 4 MG/2ML IJ SOLN
4.0000 mg | Freq: Four times a day (QID) | INTRAMUSCULAR | Status: DC | PRN
  Administered 2017-09-03 – 2017-09-10 (×2): 4 mg via INTRAVENOUS
  Filled 2017-09-02 (×2): qty 2

## 2017-09-02 MED ORDER — ASPIRIN 81 MG PO CHEW
81.0000 mg | CHEWABLE_TABLET | Freq: Every day | ORAL | Status: DC
  Administered 2017-09-02 – 2017-09-08 (×7): 81 mg via ORAL
  Filled 2017-09-02 (×7): qty 1

## 2017-09-02 MED ORDER — CARVEDILOL 12.5 MG PO TABS
18.7500 mg | ORAL_TABLET | Freq: Two times a day (BID) | ORAL | Status: DC
  Administered 2017-09-02 – 2017-09-05 (×4): 18.75 mg via ORAL
  Filled 2017-09-02 (×6): qty 2

## 2017-09-02 MED ORDER — SODIUM CHLORIDE 0.9% FLUSH
3.0000 mL | INTRAVENOUS | Status: DC | PRN
  Filled 2017-09-02: qty 3

## 2017-09-02 MED ORDER — FUROSEMIDE 10 MG/ML IJ SOLN
40.0000 mg | Freq: Once | INTRAMUSCULAR | Status: AC
  Administered 2017-09-02: 40 mg via INTRAVENOUS
  Filled 2017-09-02: qty 4

## 2017-09-02 MED ORDER — FUROSEMIDE 10 MG/ML IJ SOLN
60.0000 mg | Freq: Two times a day (BID) | INTRAMUSCULAR | Status: DC
  Administered 2017-09-02 – 2017-09-03 (×2): 60 mg via INTRAVENOUS
  Filled 2017-09-02 (×4): qty 6

## 2017-09-02 MED ORDER — ACETAMINOPHEN 325 MG PO TABS
650.0000 mg | ORAL_TABLET | ORAL | Status: DC | PRN
  Administered 2017-09-02 – 2017-09-16 (×17): 650 mg via ORAL
  Filled 2017-09-02 (×17): qty 2

## 2017-09-02 NOTE — ED Triage Notes (Signed)
Patient complaining of shortness of breath x 3 days. Also complaining of bilateral feet swelling. Denies pain at triage.

## 2017-09-02 NOTE — Telephone Encounter (Signed)
Pt recently d/c'd from [facility] on Friday [facility] nurse called today with pt having severe swelling +4 SOB and nurse says she cannot hear any air movement in lungs upon nurse evaluation - pt is scheduled to come in tomorrow to see Dr Harl Bowie however nurse wanted pt seen today - aware that we didn't have any open slots today and that pt should report back to ED for further evaluation given symptoms - will forward to provider -

## 2017-09-02 NOTE — H&P (Signed)
History and Physical    Gina [name] ZOX:[id number] DOB: 09/28/1940 DOA: 09/02/2017  PCP: Glenda Chroman, MD  Patient coming from: Home  Chief Complaint: Shortness of breath and swelling  HPI: Gina [name] is a 77 y.o. female with medical history significant of systolic congestive heart failure with an EF of 20%, chronic kidney disease with a baseline creatinine of 1.6, A. fib, coronary artery disease, hypertension comes in with several days of progressive worsening swelling and shortness of breath.  Patient reports his swelling her legs have been increasing for about 2 weeks really worse the last couple days.  She was recently hospitalized in her diuretics were held secondary to acute kidney injury.  She reports she is has PND now but no orthopnea.  Her breathing is gotten worse.  She is not requiring home oxygen.  She denies any cough or fevers.  She denies any nausea vomiting or diarrhea.  She denies any chest pain.  Patient be referred for admission for's congestive heart failure in the setting of worsening renal failure.  Review of Systems: As per HPI otherwise 10 point review of systems negative.   Past Medical History:  Diagnosis Date  . Acute renal failure [facility])     hospitalized... December 08, 2009... improved with hydration in the hospital  . Anemia    further workup needed... October, 2011.  [name] Aortic insufficiency    mild...echo... June 2 010  . Arthritis    Hips/Knees, severe, requiring a walker  . Asymmetric septal hypertrophy (HCC)    echo....04/2009....patient encouraged the family to be screened elsewhere  . Atrial fibrillation ([place])     ??? atrial fibrillation during hospitalization October, 2011 ???..  . Bradycardia   . CAD (coronary artery disease)    Mild coronary disease, catheterization, 2009  . Cardiomyopathy, nonischemic ([place])    EF improved October, 2011  . Ejection fraction    EF 25%...nondiagnostic MRI December 2009...  /  echo June, 2010  /   echo...  December 08, 2009.... ejection fraction improved... EF 60% /  EF 30%... echo.... [facility]... December 28, 2009 with CHFPLanned ICD / CRT... patient has seen Dr.Klein..EF improved October, 2011   . Groin hematoma    Right-hematoma/abscess..repaired.. December, 2009  . Hypertension   . LBBB (left bundle branch block)   . LVH (left ventricular hypertrophy)    moderately severe... echo.. June, 2010  /  EF improved October, 2011.... cancel plans for ICD  . Mitral regurgitation    mild/ moderate...echo June, 2010  /   echo.. October, 2011.... mitral regurgitation  improved  . OSA (obstructive sleep apnea)   . Renal artery stenosis (HCC)    80% upper left   . Systolic heart failure    chronic    Past Surgical History:  Procedure Laterality Date  . ABDOMINAL HYSTERECTOMY    . CARDIAC CATHETERIZATION N/A 08/14/2015   Procedure: Right/Left Heart Cath and Coronary Angiography;  Surgeon: Leonie Man, MD;  Location: [facility];  Service: Cardiovascular;  Laterality: N/A;  . REPLACEMENT TOTAL HIP W/  RESURFACING IMPLANTS    . REPLACEMENT TOTAL KNEE    . Surgical Repair of a Catheteriztion associated femoral artery injury       reports that she quit smoking about 31 years ago. Her smoking use included cigarettes. She started smoking about 58 years ago. She has a 24.00 pack-year smoking history. She has never used smokeless tobacco. She reports that she does not drink alcohol. Her  drug history is not on file.  Allergies  Allergen Reactions  . Cephalexin     REACTION: Rash to arms/legs    Family History  Problem Relation Age of Onset  . Rheumatic fever Mother        in her early 23's  . Alzheimer's disease Mother   . Stomach cancer Father 70       died 10  . Bradycardia Brother        has pacemaker  . Heart failure Paternal Grandmother   . Heart failure Paternal Grandfather     Prior to Admission medications   Medication Sig Start Date End Date Taking? Authorizing  Provider  apixaban (ELIQUIS) 5 MG TABS tablet Take 1 tablet (5 mg total) by mouth 2 (two) times daily. 08/15/17   Arnoldo Lenis, MD  aspirin 81 MG tablet Take 81 mg by mouth daily.      [provider]  calcitRIOL (ROCALTROL) 0.25 MCG capsule Take 0.25 mcg by mouth daily.    [provider]  carvedilol (COREG) 12.5 MG tablet Take 1.5 tablets (18.75 mg total) by mouth 2 (two) times daily. 08/15/17 11/13/17  Arnoldo Lenis, MD  docusate sodium (COLACE) 100 MG capsule Take 100 mg by mouth daily.    [provider]  ENTRESTO 49-51 MG TAKE 1 TABLET BY MOUTH TWICE DAILY 02/24/17   Arnoldo Lenis, MD  ferrous sulfate 325 (65 FE) MG tablet Take 325 mg by mouth daily.      [provider]  furosemide (LASIX) 20 MG tablet TAKE 1 TABLET EVERY EVENING 08/15/17   Arnoldo Lenis, MD  furosemide (LASIX) 80 MG tablet TAKE 1 TABLET EVERY MORNING 08/15/17   Arnoldo Lenis, MD  isosorbide mononitrate (IMDUR) 60 MG 24 hr tablet TAKE 1 TABLET BY MOUTH EVERY DAY 06/25/17   Arnoldo Lenis, MD  Multiple Vitamin (MULTIVITAMIN) tablet Take 1 tablet by mouth daily.    [provider]  Multiple Vitamins-Minerals (ICAPS PO) Take 1 capsule by mouth daily.    [provider]    Physical Exam: Vitals:   09/02/17 1431 09/02/17 1514 09/02/17 1600 09/02/17 1630  BP:  115/88 (!) 110/91 107/72  Pulse:  (!) 103 96 97  Resp:  (!) 23 (!) 39 (!) 28  Temp:      TempSrc:      SpO2:  94% 93% 96%  Weight: 103.4 kg (228 lb)     Height: 5\' 4"  (1.626 m)         Constitutional: NAD, calm, comfortable Vitals:   09/02/17 1431 09/02/17 1514 09/02/17 1600 09/02/17 1630  BP:  115/88 (!) 110/91 107/72  Pulse:  (!) 103 96 97  Resp:  (!) 23 (!) 39 (!) 28  Temp:      TempSrc:      SpO2:  94% 93% 96%  Weight: 103.4 kg (228 lb)     Height: 5\' 4"  (1.626 m)      Eyes: PERRL, lids and conjunctivae normal ENMT: Mucous membranes are moist. Posterior pharynx clear of  any exudate or lesions.Normal dentition.  Neck: normal, supple, no masses, no thyromegaly Respiratory: clear to auscultation bilaterally, no wheezing, no crackles. Normal respiratory effort. No accessory muscle use.  Cardiovascular: Regular rate and rhythm, no murmurs / rubs / gallops.  2+ extremity edema. 2+ pedal pulses. No carotid bruits.  Abdomen: no tenderness, no masses palpated. No hepatosplenomegaly. Bowel sounds positive.  Musculoskeletal: no clubbing / cyanosis. No joint deformity upper  and lower extremities. Good ROM, no contractures. Normal muscle tone.  Skin: no rashes, lesions, ulcers. No induration Neurologic: CN 2-12 grossly intact. Sensation intact, DTR normal. Strength 5/5 in all 4.  Psychiatric: Normal judgment and insight. Alert and oriented x 3. Normal mood.    Labs on Admission: I have personally reviewed following labs and imaging studies  CBC: Recent Labs  Lab 09/02/17 1512  WBC 5.2  NEUTROABS 3.9  HGB 11.2*  HCT 36.6  MCV 101.9*  PLT 476   Basic Metabolic Panel: Recent Labs  Lab 09/02/17 1512  NA 140  K 3.8  CL 103  CO2 24  GLUCOSE 138*  BUN 83*  CREATININE 2.36*  CALCIUM 8.9   GFR: Estimated Creatinine Clearance: 23.8 mL/min (A) (by C-G formula based on SCr of 2.36 mg/dL (H)). Liver Function Tests: No results for input(s): AST, ALT, ALKPHOS, BILITOT, PROT, ALBUMIN in the last 168 hours. No results for input(s): LIPASE, AMYLASE in the last 168 hours. No results for input(s): AMMONIA in the last 168 hours. Coagulation Profile: No results for input(s): INR, PROTIME in the last 168 hours. Cardiac Enzymes: Recent Labs  Lab 09/02/17 1512  TROPONINI 0.05*   BNP (last 3 results) No results for input(s): PROBNP in the last 8760 hours. HbA1C: No results for input(s): HGBA1C in the last 72 hours. CBG: No results for input(s): GLUCAP in the last 168 hours. Lipid Profile: No results for input(s): CHOL, HDL, LDLCALC, TRIG, CHOLHDL, LDLDIRECT in  the last 72 hours. Thyroid Function Tests: No results for input(s): TSH, T4TOTAL, FREET4, T3FREE, THYROIDAB in the last 72 hours. Anemia Panel: No results for input(s): VITAMINB12, FOLATE, FERRITIN, TIBC, IRON, RETICCTPCT in the last 72 hours. Urine analysis: No results found for: COLORURINE, APPEARANCEUR, LABSPEC, PHURINE, GLUCOSEU, HGBUR, BILIRUBINUR, KETONESUR, PROTEINUR, UROBILINOGEN, NITRITE, LEUKOCYTESUR Sepsis Labs: !!!!!!!!!!!!!!!!!!!!!!!!!!!!!!!!!!!!!!!!!!!! @LABRCNTIP (procalcitonin:4,lacticidven:4) )No results found for this or any previous visit (from the past 240 hour(s)).   Radiological Exams on Admission: Dg Chest 2 View  Result Date: 09/02/2017 CLINICAL DATA:  Worsening shortness of breath over the past 3 days. History of CHF, aortic insufficiency, and coronary artery disease. Former smoker. EXAM: CHEST - 2 VIEW COMPARISON:  PA and lateral chest x-ray of March 25, 2008 FINDINGS: The lungs are mildly hypoinflated. There is a small right pleural effusion and trace left pleural effusion. The cardiac silhouette is enlarged. The pulmonary vascularity is engorged and indistinct. The interstitial markings are increased. There is calcification in the wall of the aortic arch and descending thoracic aorta. There is multilevel degenerative disc disease of the thoracic spine. There is gentle dextrocurvature centered in the lower thoracic spine. IMPRESSION: CHF with enlargement of the cardiac silhouette, pulmonary vascular congestion, interstitial edema, and small right pleural effusion. One cannot exclude a pericardial effusion in the appropriate clinical setting. There has been an overall deterioration in the appearance of the thorax since the previous study. Thoracic aortic atherosclerosis. Electronically Signed   By: Elnoria Livingston  [place] M.D.   On: 09/02/2017 14:59    EKG: Independently reviewed.  Left bundle branch block with A. fib no change from prior  Old chart reviewed  Case discussed with  EDP Dr. Thurnell Garbe  Case discussed with cardiologist Dr. Bronson Ing  Chest x-ray reviewed with edema no focal infiltrate and a small right pleural effusion  Assessment/Plan 77 year old female with acute on chronic congestive heart failure Principal Problem:   CHF exacerbation (HCC)-placed patient on Lasix 60 mill grams IV every 12 hours.  Appreciate cardiology assistance.  ICD placement  at some point.  Continue Eliquis.  Continue rate control agents.  Continue Coreg  Active Problems:   Aortic regurgitation-noted   Hypertension-continue Coreg   Renal artery stenosis (HCC)-noted   Atrial fibrillation (HCC)-anticoagulated on Eliquis Acute kidney injury on chronic kidney disease-creatinine bumped up to 2.3 from baseline of 1.6.  Follow closely as were increasing her diuretics.     DVT prophylaxis: SCDs Code Status: Full Family Communication: Multiple Disposition Plan: Days Consults called: Cardiology Admission status: Admission   Trevaun Rendleman A MD [facility]  If 7PM-7AM, please contact night-coverage [email or web address] Password [facility]  09/02/2017, 4:57 PM

## 2017-09-02 NOTE — Progress Notes (Signed)
Called to get report to ED nurse for pt admit to floor. Was told she would call me back.

## 2017-09-02 NOTE — Telephone Encounter (Signed)
LM to return call.

## 2017-09-02 NOTE — Consult Note (Signed)
Cardiology Consultation:   Gina [name] ID: KIP KAUTZMAN; [id number]; 1940/09/25   Admit date: 09/02/2017 Date of Consult: 09/02/2017  Primary Care Provider: Glenda Chroman, MD Primary Cardiologist: Carlyle Dolly, MD   Gina [name] Profile:   Gina [name] is a 77 y.o. female with a hx of chronic systolic heart failure and new onset atrial fibrillation who is being seen today for the evaluation of shortness of breath at the request of Drs. Thurnell Garbe and Rachal.  History of Present Illness:   Gina [name] is a 77 year old woman with a history of chronic systolic heart failure and new onset atrial fibrillation and follows with Dr. Harl Bowie.  Gina [name] recently saw him on 08/15/2017.  She had patent coronary arteries by coronary angiography in 2009.  Echocardiogram demonstrates severely reduced left ventricular systolic function, LVEF 20 to 25% in January 2017.  It was also severely reduced with LVEF 20 to 25% in May 2017.  She has been hesitant about getting an ICD.  It appears she was hospitalized in early June in [place] for UTI symptoms and shortness of breath with heart rates up to 120 bpm associate with palpitations.  An echocardiogram performed there demonstrated severely reduced left ventricular systolic function, LVEF 20 to 25% with moderate right ventricular dysfunction, mild to moderate mitral and tricuspid regurgitation.  It appears diuretics were held at that time due to acute kidney injury.  She had been on Lasix 80 mg in the morning and 20 mg in the evening.  She had been on hydralazine but it was stopped due to soft blood pressure.  It was noted that Gina [name] weight was trending up and she had increased shortness of breath on 08/15/2017 in the office and Lasix was restarted at 80 mg in the morning and 20 mg in the evening.  Diltiazem was stopped and carvedilol was increased to 18.75 mg twice daily.  It was mentioned cardioversion could be considered after 3 weeks if she was consistent with  anticoagulation and remained in atrial fibrillation.  As Gina [name] weight was not coming down, Lasix was increased to 80 mg in the morning and 60 mg in the evening on 08/19/2017.  As symptoms did not improve, it was recommended on 6/21 that she be evaluated in the ED.  It appears she was again hospitalized in [place] from last Monday through Thursday.  She was discharged on Lasix 80 mg twice daily.  She tells me she was given IV Lasix for 2 days.  She felt almost back to Gina [name] baseline by the time of discharge.    The home health nurse called our office today stating that the Gina [name] had severe swelling and shortness of breath.  Dr. Harl Bowie recommended she take Lasix 80 mg twice daily with a clinic reevaluation tomorrow.  She presented to the [facility] ED today complaining of progressive exertional dyspnea, orthopnea, and bilateral leg edema.  She denies chest pain.  She told me she has decided to proceed with an ICD.  Gina [name] daughter is in the room.  She thinks Gina [name] mother put out less than 100 cc thus far.  Relevant labs: Sodium 140, potassium 3.8, BUN 83, creatinine 2.36, troponin 0 0.05, hemoglobin 11.2, BNP greater than 4500.  I reviewed the chest x-ray which demonstrated CHF with pulmonary vascular congestion, interstitial edema, and small right pleural effusion.  It noted that a pericardial effusion could not be entirely excluded.  I personally reviewed the ECG which demonstrated atrial fibrillation, 102 bpm, with left bundle branch block  morphology.  She was given a dose of IV Lasix 40 mg in the ED and internal medicine has ordered 60 mg IV twice daily.  There is no output recorded.  Blood pressure is normal.    Past Medical History:  Diagnosis Date  . Acute renal failure [facility])     hospitalized... December 08, 2009... improved with hydration in the hospital  . Anemia    further workup needed... October, 2011.  [name] Aortic insufficiency    mild...echo... June 2 010  . Arthritis      Hips/Knees, severe, requiring a walker  . Asymmetric septal hypertrophy (HCC)    echo....04/2009....Gina [name] encouraged the family to be screened elsewhere  . Atrial fibrillation ([place])     ??? atrial fibrillation during hospitalization October, 2011 ???..  . Bradycardia   . CAD (coronary artery disease)    Mild coronary disease, catheterization, 2009  . Cardiomyopathy, nonischemic ([place])    EF improved October, 2011  . Ejection fraction    EF 25%...nondiagnostic MRI December 2009...  /  echo June, 2010  /   echo... December 08, 2009.... ejection fraction improved... EF 60% /  EF 30%... echo.... [place]... December 28, 2009 with CHFPLanned ICD / CRT... Gina [name] has seen Dr.Klein..EF improved October, 2011   . Groin hematoma    Right-hematoma/abscess..repaired.. December, 2009  . Hypertension   . LBBB (left bundle branch block)   . LVH (left ventricular hypertrophy)    moderately severe... echo.. June, 2010  /  EF improved October, 2011.... cancel plans for ICD  . Mitral regurgitation    mild/ moderate...echo June, 2010  /   echo.. October, 2011.... mitral regurgitation  improved  . OSA (obstructive sleep apnea)   . Renal artery stenosis (HCC)    80% upper left   . Systolic heart failure    chronic    Past Surgical History:  Procedure Laterality Date  . ABDOMINAL HYSTERECTOMY    . CARDIAC CATHETERIZATION N/A 08/14/2015   Procedure: Right/Left Heart Cath and Coronary Angiography;  Surgeon: Leonie Man, MD;  Location: [facility];  Service: Cardiovascular;  Laterality: N/A;  . REPLACEMENT TOTAL HIP W/  RESURFACING IMPLANTS    . REPLACEMENT TOTAL KNEE    . Surgical Repair of a Catheteriztion associated femoral artery injury         Inpatient Medications: Scheduled Meds: . apixaban  5 mg Oral BID  . aspirin  81 mg Oral Daily  . carvedilol  18.75 mg Oral BID  . furosemide  60 mg Intravenous Q12H  . sodium chloride flush  3 mL Intravenous Q12H   Continuous  Infusions: . sodium chloride     PRN Meds: sodium chloride, acetaminophen, ondansetron (ZOFRAN) IV, sodium chloride flush  Allergies:    Allergies  Allergen Reactions  . Cephalexin     REACTION: Rash to arms/legs    Social History:   Social History   Socioeconomic History  . Marital status: Married    Spouse name: Not on file  . Number of children: 0  . Years of education: Not on file  . Highest education level: Not on file  Occupational History  . Not on file  Social Needs  . Financial resource strain: Not on file  . Food insecurity:    Worry: Not on file    Inability: Not on file  . Transportation needs:    Medical: Not on file    Non-medical: Not on file  Tobacco Use  . Smoking status: Former  Smoker    Packs/day: 1.00    Years: 24.00    Pack years: 24.00    Types: Cigarettes    Start date: 10/29/1958    Last attempt to quit: 12/02/1985    Years since quitting: 31.7  . Smokeless tobacco: Never Used  Substance and Sexual Activity  . Alcohol use: No    Alcohol/week: 0.0 oz  . Drug use: Not on file  . Sexual activity: Not on file  Lifestyle  . Physical activity:    Days per week: Not on file    Minutes per session: Not on file  . Stress: Not on file  Relationships  . Social connections:    Talks on phone: Not on file    Gets together: Not on file    Attends religious service: Not on file    Active member of club or organization: Not on file    Attends meetings of clubs or organizations: Not on file    Relationship status: Not on file  . Intimate partner violence:    Fear of current or ex partner: Not on file    Emotionally abused: Not on file    Physically abused: Not on file    Forced sexual activity: Not on file  Other Topics Concern  . Not on file  Social History Narrative  . Not on file    Family History:    Family History  Problem Relation Age of Onset  . Rheumatic fever Mother        in Gina [name] early 22's  . Alzheimer's disease Mother   .  Stomach cancer Father 92       died 31  . Bradycardia Brother        has pacemaker  . Heart failure Paternal Grandmother   . Heart failure Paternal Grandfather      ROS:  Please see the history of present illness.   All other ROS reviewed and negative.     Physical Exam/Data:   Vitals:   09/02/17 1514 09/02/17 1600 09/02/17 1630 09/02/17 1709  BP: 115/88 (!) 110/91 107/72 108/87  Pulse: (!) 103 96 97 (!) 101  Resp: (!) 23 (!) 39 (!) 28 20  Temp:    98 F (36.7 C)  TempSrc:    Oral  SpO2: 94% 93% 96% 95%  Weight:      Height:       No intake or output data in the 24 hours ending 09/02/17 1721 Filed Weights   09/02/17 1431  Weight: 228 lb (103.4 kg)   Body mass index is 39.14 kg/m.  General:  Well nourished, well developed, orthopneic, sitting nearly upright in bed HEENT: normal Lymph: no adenopathy Neck: + JVD Endocrine:  No thryomegaly Cardiac:  normal S1, S2; irregular rhythm; no murmur  Lungs: Diminished sounds throughout, bibasilar Rales Abd: soft, nontender, no hepatomegaly  Ext: 3+ pitting bilateral lower extremity edema Musculoskeletal:  No deformities, BUE and BLE strength normal and equal Skin: warm and dry  Neuro:  CNs 2-12 intact, no focal abnormalities noted Psych:  Normal affect    Telemetry:  Telemetry was personally reviewed and demonstrates: Atrial fibrillation with left bundle branch block morphology.  Relevant CV Studies:  02/2008 cath FINDINGS: Hemodynamics: Mean right atrial pressure was 13 mmHg. RV 43/9, PA 47/28 with mean PA pressure of 37 mmHg. Mean pulmonary capillary wedge pressure of 23 mmHg. Aorta 181/90. Left ventricle 177/17/26. Mixed venous O2 saturation was 65%, aortic saturation 92%, SVR was 1355, cardiac output  was 6.55 liters per minute. Cardiac index was 3.45. Left ventriculography: Left ventricle was enlarged. There was global severe hypokinesis with an estimated ejection fraction of 25%. Coronary  angiography: The right coronary artery was dominant and showed only luminal irregularities. Left main coronary artery was free from significant disease. The circumflex was a large vessel, there were 3 obtuse marginal vessels with a 30% ostial stenosis of the first obtuse marginal. There was a 30% stenosis in the proximal circumflex between the first and second obtuse marginals. The LAD system actually was a dual LAD type system, with a large diagonal that reached the apex. There is a mild 20% stenosis, just prior to the bifurcation of the large first diagonal off the LAD. The remainder of the LAD did not have significant disease. There were luminal irregularities in the large first diagonal. This vessel reached the apex. Non-selective abdominal aortography did show 2 right and 2 left renal arteries, there appeared to be an 80% ostial stenosis of the upper left renal artery. This was confirmed by a selective renal angiography.   02/2014 echo Study Conclusions  - Left ventricle: The cavity size was normal. Wall thickness was increased in a pattern of moderate LVH. Sigmoid shaped basal septum. Systolic function was severely reduced. The estimated ejection fraction was in the range of 25% to 30%. Diffuse hypokinesis. There is severe hypokinesis of the mid-apicalanteroseptal, anterior, and apical myocardium. Doppler parameters are consistent with abnormal left ventricular relaxation (grade 1 diastolic dysfunction). - Ventricular septum: Septal motion showed abnormal function and dyssynergy. - Aortic valve: Mildly calcified annulus. Trileaflet. There was trivial regurgitation. - Ascending aorta: The ascending aorta was mildly dilated, 43 mm. - Mitral valve: Calcified annulus. There was trivial regurgitation. - Left atrium: The atrium was severely dilated. - Right atrium: Central venous pressure (est): 3 mm Hg. - Atrial septum: No defect or  patent foramen ovale was identified. - Tricuspid valve: There was trivial regurgitation. - Pulmonary arteries: Systolic pressure could not be accurately estimated. - Pericardium, extracardiac: There was no pericardial effusion.  Impressions:  - Limited images. Unable to compare with prior study. Moderate LVH with sigmoid shaped basal septum, LVEF approximately 25-30% with diffuse hypokinesis, most severe mid to apical anteroseptal and anterior wall. Septal dyssynegry. Grade 1 diastolic dysfunction. Severe left atrial enlargement. MIldly dilated ascending aorta. Trivial aortic regurgitation. Trivial tricuspid regurgitation, unable to assess PASP.  Jan 2017 echo Study Conclusions  - Left ventricle: The cavity size was normal. Wall thickness was increased in a pattern of severe LVH. Systolic function was severely reduced. The estimated ejection fraction was in the range of 20% to 25%. Diffuse hypokinesis. Doppler parameters are consistent with abnormal left ventricular relaxation (grade 1 diastolic dysfunction). - Aortic valve: Mildly calcified annulus. Mildly thickened leaflets. Valve area (VTI): 2.43 cm^2. Valve area (Vmax): 2.39 cm^2. Valve area (Vmean): 2.28 cm^2. - Mitral valve: Mildly calcified annulus. Mildly thickened leaflets . There was mild regurgitation. - Left atrium: The atrium was moderately dilated. - Technically adequate study.  08/2015 Cath 1. Angiographically normal coronary arteries 2. Nonischemic cardiomyopathy - conflicting cardiac outputs between Fick and thermodilution. Suspect that there is some valvular disease that is throwing off the thermodilution. 3. Essentially normal right heart pressures. Mildly elevated LVEDP and PCWP, but otherwise normal pressures.   Clearly nonischemic cardiomyopathy. The Gina [name]'s itching anatomy with a tandem LAD system and codominant.  Plan:  Discharge after bedrest following femoral  sheath removal.  Continue current management. She appears euvolemic   Laboratory  Data:  Chemistry Recent Labs  Lab 09/02/17 1512  NA 140  K 3.8  CL 103  CO2 24  GLUCOSE 138*  BUN 83*  CREATININE 2.36*  CALCIUM 8.9  GFRNONAA 19*  GFRAA 22*  ANIONGAP 13    No results for input(s): PROT, ALBUMIN, AST, ALT, ALKPHOS, BILITOT in the last 168 hours. Hematology Recent Labs  Lab 09/02/17 1512  WBC 5.2  RBC 3.59*  HGB 11.2*  HCT 36.6  MCV 101.9*  MCH 31.2  MCHC 30.6  RDW 20.9*  PLT 188   Cardiac Enzymes Recent Labs  Lab 09/02/17 1512  TROPONINI 0.05*   No results for input(s): TROPIPOC in the last 168 hours.  BNP Recent Labs  Lab 09/02/17 1512  BNP >4,500.0*    DDimer No results for input(s): DDIMER in the last 168 hours.  Radiology/Studies:  Dg Chest 2 View  Result Date: 09/02/2017 CLINICAL DATA:  Worsening shortness of breath over the past 3 days. History of CHF, aortic insufficiency, and coronary artery disease. Former smoker. EXAM: CHEST - 2 VIEW COMPARISON:  PA and lateral chest x-ray of March 25, 2008 FINDINGS: The lungs are mildly hypoinflated. There is a small right pleural effusion and trace left pleural effusion. The cardiac silhouette is enlarged. The pulmonary vascularity is engorged and indistinct. The interstitial markings are increased. There is calcification in the wall of the aortic arch and descending thoracic aorta. There is multilevel degenerative disc disease of the thoracic spine. There is gentle dextrocurvature centered in the lower thoracic spine. IMPRESSION: CHF with enlargement of the cardiac silhouette, pulmonary vascular congestion, interstitial edema, and small right pleural effusion. One cannot exclude a pericardial effusion in the appropriate clinical setting. There has been an overall deterioration in the appearance of the thorax since the previous study. Thoracic aortic atherosclerosis. Electronically Signed   By: David  Martinique M.D.   On:  09/02/2017 14:59    Assessment and Plan:   1.  Acute on chronic systolic heart failure, LVEF 20 to 25%: I suspect she has developed furosemide tolerance likely due to reduced bioavailability.  She has received 1 dose of IV Lasix 40 mg and IV 60 mg twice daily has been ordered by internal medicine.  I think this is a reasonable starting point.  She will likely need torsemide at the time of discharge.  If she remains in atrial fibrillation, I would likely plan for an outpatient direct-current cardioversion when she is euvolemic.  She is agreed to an ICD.  She would also benefit from cardiac resynchronization therapy given left bundle branch block.  2.  New onset atrial fibrillation: Heart rate is in the mid 90s to low 100 bpm range at present.  I would continue carvedilol at present dose.  She is systemically anticoagulated with Eliquis 5 mg twice daily.  If she remains in atrial fibrillation, I would plan for an outpatient direct-current cardioversion once she is euvolemic.  3.  Obstructive sleep apnea: Continue CPAP.    4.  Hypertension: Blood pressure is normal.  This will need continued monitoring given need for IV diuresis.  5.  Left bundle branch block: This is chronic.  QRS duration is 214 ms.  Given severely reduced left ventricular systolic function, she would benefit from cardiac recentralization therapy.  We discussed this and she has agreed to proceed with this in the outpatient setting.  6.  Chronic kidney disease stage III: BUN 83, creatinine 2.36.  Creatinine was 1.6 on 06/03/2017.  This will need continued  monitoring given need for IV diuresis.   For questions or updates, please contact [place] Please consult [email or web address] for contact info under Cardiology/STEMI.   Signed, Kate Sable, MD  09/02/2017 5:21 PM

## 2017-09-02 NOTE — Telephone Encounter (Signed)
What dose of lasix did they discharge her on? I would have her taked 80mg  bid today, reevalaute tomorrow   Zandra Abts MD

## 2017-09-02 NOTE — ED Provider Notes (Signed)
[facility] EMERGENCY DEPARTMENT Provider Note   CSN: [id number] Arrival date & time: 09/02/17  1416     History   Chief Complaint Chief Complaint  Patient presents with  . Shortness of Breath    HPI Gina [name] is a 77 y.o. female.  HPI  Pt was seen at 1455.  Per pt and her family, c/o gradual onset and worsening of persistent SOB for the past 5 days. Has been associated with weight gain of 5lbs and increasing pedal edema. Pt has been taking lasix 80mg  PO BID as prescribed since being discharged from OSH in [place] 5 days ago. Pt denies CP/palpitations, no cough, no fevers, no abd pain, no N/V/D.      Past Medical History:  Diagnosis Date  . Acute renal failure [facility])     hospitalized... December 08, 2009... improved with hydration in the hospital  . Anemia    further workup needed... October, 2011.  [name] Aortic insufficiency    mild...echo... June 2 010  . Arthritis    Hips/Knees, severe, requiring a walker  . Asymmetric septal hypertrophy (HCC)    echo....04/2009....patient encouraged the family to be screened elsewhere  . Atrial fibrillation ([place])     ??? atrial fibrillation during hospitalization October, 2011 ???..  . Bradycardia   . CAD (coronary artery disease)    Mild coronary disease, catheterization, 2009  . Cardiomyopathy, nonischemic ([place])    EF improved October, 2011  . Ejection fraction    EF 25%...nondiagnostic MRI December 2009...  /  echo June, 2010  /   echo... December 08, 2009.... ejection fraction improved... EF 60% /  EF 30%... echo.... [place]... December 28, 2009 with CHFPLanned ICD / CRT... patient has seen Dr.Klein..EF improved October, 2011   . Groin hematoma    Right-hematoma/abscess..repaired.. December, 2009  . Hypertension   . LBBB (left bundle branch block)   . LVH (left ventricular hypertrophy)    moderately severe... echo.. June, 2010  /  EF improved October, 2011.... cancel plans for ICD  . Mitral regurgitation    mild/ moderate...echo  June, 2010  /   echo.. October, 2011.... mitral regurgitation  improved  . OSA (obstructive sleep apnea)   . Renal artery stenosis (HCC)    80% upper left   . Systolic heart failure    chronic    Patient Active Problem List   Diagnosis Date Noted  . CHF exacerbation ([place]) 09/02/2017  . Overweight(278.02) 07/29/2013  . Chronic combined systolic and diastolic CHF (congestive heart failure) ([place]) 12/18/2012  . Coronary artery disease, non-occlusive   . Ejection fraction   . Cardiomyopathy, nonischemic ([place])   . Asymmetric septal hypertrophy (HCC)   . LVH (left ventricular hypertrophy)   . Hypertension   . Mitral regurgitation   . Renal artery stenosis ([place])   . Anemia   . LBBB (left bundle branch block)   . OSA (obstructive sleep apnea)   . Bradycardia   . Atrial fibrillation ([place])   . HYPOKALEMIA 02/14/2010  . Aortic regurgitation 11/21/2008    Past Surgical History:  Procedure Laterality Date  . ABDOMINAL HYSTERECTOMY    . CARDIAC CATHETERIZATION N/A 08/14/2015   Procedure: Right/Left Heart Cath and Coronary Angiography;  Surgeon: Leonie Man, MD;  Location: [place] [facility];  Service: Cardiovascular;  Laterality: N/A;  . REPLACEMENT TOTAL HIP W/  RESURFACING IMPLANTS    . REPLACEMENT TOTAL KNEE    . Surgical Repair of a Catheteriztion associated femoral artery injury  OB History   None      Home Medications    Prior to Admission medications   Medication Sig Start Date End Date Taking? Authorizing Provider  apixaban (ELIQUIS) 5 MG TABS tablet Take 1 tablet (5 mg total) by mouth 2 (two) times daily. 08/15/17   Arnoldo Lenis, MD  aspirin 81 MG tablet Take 81 mg by mouth daily.      [provider]  calcitRIOL (ROCALTROL) 0.25 MCG capsule Take 0.25 mcg by mouth daily.    [provider]  carvedilol (COREG) 12.5 MG tablet Take 1.5 tablets (18.75 mg total) by mouth 2 (two) times daily. 08/15/17 11/13/17  Arnoldo Lenis, MD  docusate  sodium (COLACE) 100 MG capsule Take 100 mg by mouth daily.    [provider]  ENTRESTO 49-51 MG TAKE 1 TABLET BY MOUTH TWICE DAILY 02/24/17   Arnoldo Lenis, MD  ferrous sulfate 325 (65 FE) MG tablet Take 325 mg by mouth daily.      [provider]  furosemide (LASIX) 20 MG tablet TAKE 1 TABLET EVERY EVENING 08/15/17   Arnoldo Lenis, MD  furosemide (LASIX) 80 MG tablet TAKE 1 TABLET EVERY MORNING 08/15/17   Arnoldo Lenis, MD  isosorbide mononitrate (IMDUR) 60 MG 24 hr tablet TAKE 1 TABLET BY MOUTH EVERY DAY 06/25/17   Arnoldo Lenis, MD  Multiple Vitamin (MULTIVITAMIN) tablet Take 1 tablet by mouth daily.    [provider]  Multiple Vitamins-Minerals (ICAPS PO) Take 1 capsule by mouth daily.    [provider]    Family History Family History  Problem Relation Age of Onset  . Rheumatic fever Mother        in her early 60's  . Alzheimer's disease Mother   . Stomach cancer Father 71       died 65  . Bradycardia Brother        has pacemaker  . Heart failure Paternal Grandmother   . Heart failure Paternal Grandfather     Social History Social History   Tobacco Use  . Smoking status: Former Smoker    Packs/day: 1.00    Years: 24.00    Pack years: 24.00    Types: Cigarettes    Start date: 10/29/1958    Last attempt to quit: 12/02/1985    Years since quitting: 31.7  . Smokeless tobacco: Never Used  Substance Use Topics  . Alcohol use: No    Alcohol/week: 0.0 oz  . Drug use: Not on file     Allergies   Cephalexin   Review of Systems Review of Systems ROS: Statement: All systems negative except as marked or noted in the HPI; Constitutional: Negative for fever and chills. ; ; Eyes: Negative for eye pain, redness and discharge. ; ; ENMT: Negative for ear pain, hoarseness, nasal congestion, sinus pressure and sore throat. ; ; Cardiovascular: Negative for chest pain, palpitations, diaphoresis, +dyspnea and peripheral edema. ; ;  Respiratory: Negative for cough, wheezing and stridor. ; ; Gastrointestinal: Negative for nausea, vomiting, diarrhea, abdominal pain, blood in stool, hematemesis, jaundice and rectal bleeding. . ; ; Genitourinary: Negative for dysuria, flank pain and hematuria. ; ; Musculoskeletal: Negative for back pain and neck pain. Negative for swelling and trauma.; ; Skin: Negative for pruritus, rash, abrasions, blisters, bruising and skin lesion.; ; Neuro: Negative for headache, lightheadedness and neck stiffness. Negative for weakness, altered level of consciousness, altered mental status, extremity weakness, paresthesias, involuntary movement, seizure and syncope.  Physical Exam Updated Vital Signs BP 107/72   Pulse 97   Temp 98.9 F (37.2 C) (Oral)   Resp (!) 28   Ht 5\' 4"  (1.626 m)   Wt 103.4 kg (228 lb)   SpO2 96%   BMI 39.14 kg/m    Patient Vitals for the past 24 hrs:  BP Temp Temp src Pulse Resp SpO2 Height Weight  09/02/17 1630 107/72 - - 97 (!) 28 96 % - -  09/02/17 1600 (!) 110/91 - - 96 (!) 39 93 % - -  09/02/17 1514 115/88 - - (!) 103 (!) 23 94 % - -  09/02/17 1431 - - - - - - 5\' 4"  (1.626 m) 103.4 kg (228 lb)  09/02/17 1430 105/80 98.9 F (37.2 C) Oral 84 (!) 30 95 % - -    Physical Exam 1500: Physical examination:  Nursing notes reviewed; Vital signs and O2 SAT reviewed;  Constitutional: Well developed, Well nourished, Well hydrated, In no acute distress; Head:  Normocephalic, atraumatic; Eyes: EOMI, PERRL, No scleral icterus; ENMT: Mouth and pharynx normal, Mucous membranes moist; Neck: Supple, Full range of motion, No lymphadenopathy; Cardiovascular: Irregular rate and rhythm, No gallop; Respiratory: Breath sounds coarse & equal bilaterally, No wheezes. Sitting upright, speaking full sentences, Normal respiratory effort/excursion; Chest: Nontender, Movement normal; Abdomen: Soft, Nontender, Nondistended, Normal bowel sounds; Genitourinary: No CVA tenderness; Extremities:  Peripheral pulses normal, No tenderness, +2-3 pedal edema bilat, with chronic stasis changes. No calf asymmetry.; Neuro: AA&Ox3, Major CN grossly intact.  Speech clear. No gross focal motor deficits in extremities.; Skin: Color normal, Warm, Dry.   ED Treatments / Results  Labs (all labs ordered are listed, but only abnormal results are displayed)   EKG EKG Interpretation  Date/Time:  Tuesday September 02 2017 14:35:08 EDT Ventricular Rate:  102 PR Interval:    QRS Duration: 214 QT Interval:  382 QTC Calculation: 497 R Axis:   -43 Text Interpretation:  Atrial fibrillation with rapid ventricular response Left axis deviation Left bundle branch block When compared with ECG of 09/14/2017 No significant change was found Confirmed by Francine Graven [phone]) on 09/02/2017 3:07:58 PM   Radiology   Procedures Procedures (including critical care time)  Medications Ordered in ED Medications  furosemide (LASIX) injection 40 mg (40 mg Intravenous Given 09/02/17 1521)     Initial Impression / Assessment and Plan / ED Course  I have reviewed the triage vital signs and the nursing notes.  Pertinent labs & imaging results that were available during my care of the patient were reviewed by me and considered in my medical decision making (see chart for details).  MDM Reviewed: previous chart, nursing note and vitals Reviewed previous: labs and ECG Interpretation: labs, ECG and x-ray    Results for orders placed or performed during the hospital encounter of 00/86/76  Basic metabolic panel  Result Value Ref Range   Sodium 140 135 - 145 mmol/L   Potassium 3.8 3.5 - 5.1 mmol/L   Chloride 103 98 - 111 mmol/L   CO2 24 22 - 32 mmol/L   Glucose, Bld 138 (H) 70 - 99 mg/dL   BUN 83 (H) 8 - 23 mg/dL   Creatinine, Ser 2.36 (H) 0.44 - 1.00 mg/dL   Calcium 8.9 8.9 - 10.3 mg/dL   GFR calc non Af Amer 19 (L) >60 mL/min   GFR calc Af Amer 22 (L) >60 mL/min   Anion gap 13 5 - 15  Troponin I  Result  Value  Ref Range   Troponin I 0.05 (HH) <0.03 ng/mL  CBC with Differential  Result Value Ref Range   WBC 5.2 4.0 - 10.5 K/uL   RBC 3.59 (L) 3.87 - 5.11 MIL/uL   Hemoglobin 11.2 (L) 12.0 - 15.0 g/dL   HCT 36.6 36.0 - 46.0 %   MCV 101.9 (H) 78.0 - 100.0 fL   MCH 31.2 26.0 - 34.0 pg   MCHC 30.6 30.0 - 36.0 g/dL   RDW 20.9 (H) 11.5 - 15.5 %   Platelets 188 150 - 400 K/uL   Neutrophils Relative % 75 %   Neutro Abs 3.9 1.7 - 7.7 K/uL   Lymphocytes Relative 14 %   Lymphs Abs 0.7 0.7 - 4.0 K/uL   Monocytes Relative 10 %   Monocytes Absolute 0.5 0.1 - 1.0 K/uL   Eosinophils Relative 1 %   Eosinophils Absolute 0.1 0.0 - 0.7 K/uL   Basophils Relative 0 %   Basophils Absolute 0.0 0.0 - 0.1 K/uL  Brain natriuretic peptide  Result Value Ref Range   B Natriuretic Peptide >4,500.0 (H) 0.0 - 100.0 pg/mL   Dg Chest 2 View Result Date: 09/02/2017 CLINICAL DATA:  Worsening shortness of breath over the past 3 days. History of CHF, aortic insufficiency, and coronary artery disease. Former smoker. EXAM: CHEST - 2 VIEW COMPARISON:  PA and lateral chest x-ray of March 25, 2008 FINDINGS: The lungs are mildly hypoinflated. There is a small right pleural effusion and trace left pleural effusion. The cardiac silhouette is enlarged. The pulmonary vascularity is engorged and indistinct. The interstitial markings are increased. There is calcification in the wall of the aortic arch and descending thoracic aorta. There is multilevel degenerative disc disease of the thoracic spine. There is gentle dextrocurvature centered in the lower thoracic spine. IMPRESSION: CHF with enlargement of the cardiac silhouette, pulmonary vascular congestion, interstitial edema, and small right pleural effusion. One cannot exclude a pericardial effusion in the appropriate clinical setting. There has been an overall deterioration in the appearance of the thorax since the previous study. Thoracic aortic atherosclerosis. Electronically Signed    By: David  Martinique M.D.   On: 09/02/2017 14:59   Results for NIYANA, CHESBRO (MRN [id number]) as of 09/02/2017 16:53  Ref. Range 08/14/2015 10:36 09/02/2017 15:12  BUN Latest Ref Range: 8 - 23 mg/dL 47 (H) 83 (H)  Creatinine Latest Ref Range: 0.44 - 1.00 mg/dL 1.60 (H) 2.36 (H)    1555:  Pt very SOB just moving from wheelchair to stretcher. IV lasix 40mg  ordered shortly after pt arrival. BUN/Cr elevated from baseline. BNP significantly elevated (no old to compare) and CHF on CXR. CXR also questions pleural effusion. T/C returned from [facility] Dr. Dwana Curd, case discussed, including:  HPI, pertinent PM/SHx, VS/PE, dx testing, ED course and treatment:  Agrees with IV lasix 40mg  now, no need for Echo today as pt 1st needs diuresis, OK to admit to medicine service for IV diuresis.  1610:  T/C returned from [facility] Dr. Shanon Brow, case discussed, including:  HPI, pertinent PM/SHx, VS/PE, dx testing, ED course and treatment, d/w Cards MD:  Agreeable to admit.       Final Clinical Impressions(s) / ED Diagnoses   Final diagnoses:  Acute on chronic congestive heart failure, unspecified heart failure type ([place])  AKI (acute kidney injury) ([place])  Pleural effusion, right    ED Discharge Orders    None       Francine Graven, DO 09/04/17 1448

## 2017-09-02 NOTE — ED Notes (Signed)
CRITICAL VALUE ALERT  Critical Value: troponin 0.05  Date & Time Notied:  09/02/2017 @ 3685  Provider Notified: Thurnell Garbe  Orders Received/Actions taken: n/a

## 2017-09-03 ENCOUNTER — Ambulatory Visit: Payer: Medicare Other | Admitting: Cardiology

## 2017-09-03 DIAGNOSIS — G4733 Obstructive sleep apnea (adult) (pediatric): Secondary | ICD-10-CM

## 2017-09-03 DIAGNOSIS — N179 Acute kidney failure, unspecified: Secondary | ICD-10-CM

## 2017-09-03 LAB — BASIC METABOLIC PANEL
Anion gap: 11 (ref 5–15)
BUN: 88 mg/dL — ABNORMAL HIGH (ref 8–23)
CO2: 25 mmol/L (ref 22–32)
Calcium: 8.9 mg/dL (ref 8.9–10.3)
Chloride: 106 mmol/L (ref 98–111)
Creatinine, Ser: 2.58 mg/dL — ABNORMAL HIGH (ref 0.44–1.00)
GFR calc Af Amer: 20 mL/min — ABNORMAL LOW (ref >60)
GFR calc non Af Amer: 17 mL/min — ABNORMAL LOW (ref >60)
Glucose, Bld: 120 mg/dL — ABNORMAL HIGH (ref 70–99)
Potassium: 4.2 mmol/L (ref 3.5–5.1)
Sodium: 142 mmol/L (ref 135–145)

## 2017-09-03 LAB — TROPONIN I
Troponin I: 0.05 ng/mL (ref <0.03)
Troponin I: 0.04 ng/mL (ref <0.03)
Troponin I: 0.04 ng/mL (ref <0.03)

## 2017-09-03 MED ORDER — FUROSEMIDE 10 MG/ML IJ SOLN
40.0000 mg | Freq: Two times a day (BID) | INTRAMUSCULAR | Status: DC
  Administered 2017-09-03 – 2017-09-05 (×4): 40 mg via INTRAVENOUS
  Filled 2017-09-03 (×4): qty 4

## 2017-09-03 NOTE — Progress Notes (Signed)
BP low at 96/70 - coreg held this evening.  Dr. Manuella Ghazi notified via text page

## 2017-09-03 NOTE — Evaluation (Signed)
Physical Therapy Evaluation Patient Details Name: Gina [name] MRN: [id number] DOB: [birth year] Today's Date: 09/03/2017   History of Present Illness   Gina [name] is a 77 y.o. female with medical history significant of systolic congestive heart failure with an EF of 20%, chronic kidney disease with a baseline creatinine of 1.6, A. fib, coronary artery disease, hypertension comes in with several days of progressive worsening swelling and shortness of breath.  Patient reports his swelling her legs have been increasing for about 2 weeks really worse the last couple days.  She was recently hospitalized in her diuretics were held secondary to acute kidney injury.  She reports she is has PND now but no orthopnea.  Her breathing is gotten worse.  She is not requiring home oxygen.  She denies any cough or fevers.  She denies any nausea vomiting or diarrhea.  She denies any chest pain.  Patient be referred for admission for's congestive heart failure in the setting of worsening renal failure.    Clinical Impression  Patient has difficulty sitting up at bedside with bed flat, required bed slightly raised for completing sit to stands using her Rollator, required sitting rest break due to SOB before ambulating back to room and tolerated sitting up in chair after therapy.  Patient will benefit from continued physical therapy in hospital and recommended venue below to increase strength, balance, endurance for safe ADLs and gait.    Follow Up Recommendations Home health PT;Supervision for mobility/OOB    Equipment Recommendations  None recommended by PT    Recommendations for Other Services       Precautions / Restrictions Precautions Precautions: Fall Restrictions Weight Bearing Restrictions: No      Mobility  Bed Mobility Overal bed mobility: Needs Assistance Bed Mobility: Supine to Sit;Sit to Supine     Supine to sit: Min assist Sit to supine: Min guard   General bed mobility comments:  has difficulty sitting up from flat bed, has to use siderails  Transfers Overall transfer level: Needs assistance Equipment used: 4-wheeled walker Transfers: Sit to/from [facility] Sit to Stand: Supervision Stand pivot transfers: Supervision       General transfer comment: slow labored movement  Ambulation/Gait Ambulation/Gait assistance: Supervision Gait Distance (Feet): 25 Feet Assistive device: Rolling walker (2 wheeled) Gait Pattern/deviations: Decreased step length - right;Decreased step length - left;Decreased stride length Gait velocity: slow   General Gait Details: demonstrates slow labored cadence without loss of balance, required sitting rest break on rollator due to SOB before walking back to room  Stairs            Wheelchair Mobility    Modified Rankin (Stroke Patients Only)       Balance Overall balance assessment: Needs assistance Sitting-balance support: Feet supported;No upper extremity supported Sitting balance-Leahy Scale: Good     Standing balance support: Bilateral upper extremity supported;During functional activity Standing balance-Leahy Scale: Fair                               Pertinent Vitals/Pain Pain Assessment: No/denies pain    Home Living Family/patient expects to be discharged to:: Private residence Living Arrangements: Spouse/significant other Available Help at Discharge: Family Type of Home: House Home Access: Stairs to enter   Technical brewer of Steps: 1 Home Layout: One level Home Equipment: Cane - single point;Walker - 4 wheels;Bedside commode;Shower seat;Grab bars - tub/shower;Wheelchair - manual      Prior  Function Level of Independence: Independent with assistive device(s)         Comments: household, short distanced community ambulator with Rollator mostly, sometimes uses SPC     Hand Dominance        Extremity/Trunk Assessment   Upper Extremity Assessment Upper  Extremity Assessment: Generalized weakness    Lower Extremity Assessment Lower Extremity Assessment: Generalized weakness    Cervical / Trunk Assessment Cervical / Trunk Assessment: Normal  Communication   Communication: No difficulties  Cognition Arousal/Alertness: Awake/alert Behavior During Therapy: WFL for tasks assessed/performed Overall Cognitive Status: Within Functional Limits for tasks assessed                                        General Comments      Exercises     Assessment/Plan    PT Assessment Patient needs continued PT services  PT Problem List Decreased strength;Decreased activity tolerance;Decreased balance;Decreased mobility       PT Treatment Interventions Gait training;Functional mobility training;Therapeutic activities;Therapeutic exercise;Stair training;Patient/family education    PT Goals (Current goals can be found in the Care Plan section)  Acute Rehab PT Goals Patient Stated Goal: return home able to walk safely PT Goal Formulation: With patient Time For Goal Achievement: 09/10/17 Potential to Achieve Goals: Good    Frequency Min 3X/week   Barriers to discharge        Co-evaluation               AM-PAC PT "6 Clicks" Daily Activity  Outcome Measure Difficulty turning over in bed (including adjusting bedclothes, sheets and blankets)?: None Difficulty moving from lying on back to sitting on the side of the bed? : A Little Difficulty sitting down on and standing up from a chair with arms (e.g., wheelchair, bedside commode, etc,.)?: A Little Help needed moving to and from a bed to chair (including a wheelchair)?: A Little Help needed walking in hospital room?: A Little Help needed climbing 3-5 steps with a railing? : A Lot 6 Click Score: 18    End of Session   Activity Tolerance: Patient tolerated treatment well;Patient limited by fatigue(Patient limited by SOB) Patient left: in chair;with call bell/phone within  reach Nurse Communication: Mobility status PT Visit Diagnosis: Unsteadiness on feet (R26.81);Other abnormalities of gait and mobility (R26.89);Muscle weakness (generalized) (M62.81)    Time: 7793-9030 PT Time Calculation (min) (ACUTE ONLY): 26 min   Charges:   PT Evaluation $PT Eval Moderate Complexity: 1 Mod PT Treatments $Therapeutic Activity: 23-37 mins   PT G Codes:        2:35 PM, 2017-09-21 Lonell Grandchild, MPT Physical Therapist with [facility] 336 [phone] office [phone] mobile phone

## 2017-09-03 NOTE — Plan of Care (Signed)
  Problem: Acute Rehab PT Goals(only PT should resolve) Goal: Pt Will Go Supine/Side To Sit Outcome: Progressing Flowsheets (Taken 09/03/2017 1437) Pt will go Supine/Side to Sit: with supervision Goal: Patient Will Transfer Sit To/From Stand Outcome: Progressing Flowsheets (Taken 09/03/2017 1437) Patient will transfer sit to/from stand: with modified independence Goal: Pt Will Transfer Bed To Chair/Chair To Bed Outcome: Progressing Flowsheets (Taken 09/03/2017 1437) Pt will Transfer Bed to Chair/Chair to Bed: with modified independence Goal: Pt Will Ambulate Outcome: Progressing Flowsheets (Taken 09/03/2017 1437) Pt will Ambulate: 50 feet;with modified independence;with rolling walker   2:37 PM, 09/03/17 Lonell Grandchild, MPT Physical Therapist with [facility] 336 [phone] office [phone] mobile phone

## 2017-09-03 NOTE — Care Management Note (Signed)
Case Management Note  Patient Details  Name: Gina [name] MRN: [id number] Date of Birth: 06/28/1940  Subjective/Objective:   CHF. From home with husband. Ind with ADL's. Has rollator walker. Active with [facility] home health for RN and PT.  Currently on room.       Action/Plan: DC home with home health. Gina [name] of [facility] aware of admission and will follow for orders.   Expected Discharge Date:     09/04/2017             Expected Discharge Plan:  [place]  In-House Referral:     Discharge planning Services  CM Consult  Post Acute Care Choice:  Home Health, Resumption of Svcs/PTA Provider Choice offered to:  Patient  DME Arranged:    DME Agency:     HH Arranged:  RN, PT HH Agency:  [place]  Status of Service:  Completed, signed off  If discussed at [place] of Stay Meetings, dates discussed:    Additional Comments:  Cordale Manera, Chauncey Reading, RN 09/03/2017, 10:49 AM

## 2017-09-03 NOTE — Progress Notes (Signed)
Patient had total of 600 cc voided this shift 7a-7p with one instance of incontinence that was unable to be measured.  Bladder scanned to ensure no retention - only approx 40 cc detected.

## 2017-09-03 NOTE — Progress Notes (Signed)
PROGRESS NOTE    Gina [name]  QMG:[id number] DOB: August 05, 1940 DOA: 09/02/2017 PCP: Glenda Chroman, MD   Brief Narrative:   Gina [name] is a 77 y.o. female with medical history significant of systolic congestive heart failure with an EF of 20%, chronic kidney disease with a baseline creatinine of 1.6, A. fib, coronary artery disease, hypertension comes in with several days of progressive worsening swelling and shortness of breath.  Patient reports his swelling her legs have been increasing for about 2 weeks really worse the last couple days.  She was admitted with acute CHF exacerbation for which she has been placed on Lasix for diuresis.    Assessment & Plan:   Principal Problem:   CHF exacerbation ([place]) Active Problems:   Aortic regurgitation   Hypertension   Renal artery stenosis (HCC)   Atrial fibrillation (HCC)   AKI (acute kidney injury) ([place])   1. Acute on chronic systolic CHF decompensation.  Appreciate cardiology recommendations for continued diuresis with IV Lasix now at 40 mg twice daily as BUN and creatinine have elevated.  Will anticipate discharge on torsemide 20 mg twice daily with close follow-up to cardiology at time of discharge.  She is improving and appears to be down approximately 3 pounds. 2. New onset atrial fibrillation.  Heart rate remains controlled in the low 100 bpm range.  Continue carvedilol and Eliquis for anticoagulation.  Plans are for outpatient direct current cardioversion once euvolemic.   3. Hypertension.  Blood pressures controlled.  Continue to monitor with ongoing aggressive diuresis. 4. CKD stage III.  Creatinine has been uptrending with aggressive IV diuresis for which dose has been decreased today.  Clinically much improved and therefore may not require much more diuresis at this point. 5. OSA.  Continue CPAP.   DVT prophylaxis: SCDs Code Status: Full Family Communication: None at bedside Disposition Plan: Continue gentle diuresis with  plans for discharge in the next 24 to 48 hours   Consultants:   Cardiology  Procedures:   None  Antimicrobials:   None   Subjective: Patient seen and evaluated today with no new acute complaints or concerns. No acute concerns or events noted overnight.  She is overall feeling much better and states that her breathing and edema have improved.  Objective: Vitals:   09/02/17 2135 09/03/17 0508 09/03/17 0643 09/03/17 0843  BP: 105/74 113/88    Pulse: 68 (!) 38  (!) 102  Resp: 19 18    Temp: 97.8 F (36.6 C) (!) 97.5 F (36.4 C)    TempSrc: Oral Oral    SpO2: 94% 96%    Weight:   104.5 kg (230 lb 6.1 oz)   Height:        Intake/Output Summary (Last 24 hours) at 09/03/2017 1249 Last data filed at 09/03/2017 1146 Gross per 24 hour  Intake 540 ml  Output 300 ml  Net 240 ml   Filed Weights   09/02/17 1431 09/02/17 1811 09/03/17 0643  Weight: 103.4 kg (228 lb) 105.7 kg (233 lb 0.4 oz) 104.5 kg (230 lb 6.1 oz)    Examination:  General exam: Appears calm and comfortable  Respiratory system: Clear to auscultation. Respiratory effort normal. Cardiovascular system: S1 & S2 heard, RRR. No JVD, murmurs, rubs, gallops or clicks.  Continues to have 1-2+ pitting edema. Gastrointestinal system: Abdomen is nondistended, soft and nontender. No organomegaly or masses felt. Normal bowel sounds heard. Central nervous system: Alert and oriented. No focal neurological deficits. Extremities: Symmetric 5 x  5 power. Skin: No rashes, lesions or ulcers Psychiatry: Judgement and insight appear normal. Mood & affect appropriate.     Data Reviewed: I have personally reviewed following labs and imaging studies  CBC: Recent Labs  Lab 09/02/17 1512  WBC 5.2  NEUTROABS 3.9  HGB 11.2*  HCT 36.6  MCV 101.9*  PLT 497   Basic Metabolic Panel: Recent Labs  Lab 09/02/17 1512 09/03/17 0411  NA 140 142  K 3.8 4.2  CL 103 106  CO2 24 25  GLUCOSE 138* 120*  BUN 83* 88*  CREATININE 2.36*  2.58*  CALCIUM 8.9 8.9   GFR: Estimated Creatinine Clearance: 21.8 mL/min (A) (by C-G formula based on SCr of 2.58 mg/dL (H)). Liver Function Tests: No results for input(s): AST, ALT, ALKPHOS, BILITOT, PROT, ALBUMIN in the last 168 hours. No results for input(s): LIPASE, AMYLASE in the last 168 hours. No results for input(s): AMMONIA in the last 168 hours. Coagulation Profile: No results for input(s): INR, PROTIME in the last 168 hours. Cardiac Enzymes: Recent Labs  Lab 09/02/17 1512 09/02/17 1741 09/03/17 0411 09/03/17 0449 09/03/17 1044  TROPONINI 0.05* 0.05* 0.05* 0.04* 0.04*   BNP (last 3 results) No results for input(s): PROBNP in the last 8760 hours. HbA1C: No results for input(s): HGBA1C in the last 72 hours. CBG: No results for input(s): GLUCAP in the last 168 hours. Lipid Profile: No results for input(s): CHOL, HDL, LDLCALC, TRIG, CHOLHDL, LDLDIRECT in the last 72 hours. Thyroid Function Tests: No results for input(s): TSH, T4TOTAL, FREET4, T3FREE, THYROIDAB in the last 72 hours. Anemia Panel: No results for input(s): VITAMINB12, FOLATE, FERRITIN, TIBC, IRON, RETICCTPCT in the last 72 hours. Sepsis Labs: No results for input(s): PROCALCITON, LATICACIDVEN in the last 168 hours.  No results found for this or any previous visit (from the past 240 hour(s)).       Radiology Studies: Dg Chest 2 View  Result Date: 09/02/2017 CLINICAL DATA:  Worsening shortness of breath over the past 3 days. History of CHF, aortic insufficiency, and coronary artery disease. Former smoker. EXAM: CHEST - 2 VIEW COMPARISON:  PA and lateral chest x-ray of March 25, 2008 FINDINGS: The lungs are mildly hypoinflated. There is a small right pleural effusion and trace left pleural effusion. The cardiac silhouette is enlarged. The pulmonary vascularity is engorged and indistinct. The interstitial markings are increased. There is calcification in the wall of the aortic arch and descending  thoracic aorta. There is multilevel degenerative disc disease of the thoracic spine. There is gentle dextrocurvature centered in the lower thoracic spine. IMPRESSION: CHF with enlargement of the cardiac silhouette, pulmonary vascular congestion, interstitial edema, and small right pleural effusion. One cannot exclude a pericardial effusion in the appropriate clinical setting. There has been an overall deterioration in the appearance of the thorax since the previous study. Thoracic aortic atherosclerosis. Electronically Signed   By: David  Martinique M.D.   On: 09/02/2017 14:59        Scheduled Meds: . apixaban  5 mg Oral BID  . aspirin  81 mg Oral Daily  . carvedilol  18.75 mg Oral BID WC  . furosemide  40 mg Intravenous BID  . sodium chloride flush  3 mL Intravenous Q12H   Continuous Infusions: . sodium chloride       LOS: 1 day    Time spent: 30 minutes    Naveed Humphres Darleen Crocker, DO [facility] Pager [phone]  If 7PM-7AM, please contact night-coverage [email or web address] Password [facility] 09/03/2017, 12:49 PM

## 2017-09-03 NOTE — Progress Notes (Signed)
Progress Note  Patient Name: Gina [name] Date of Encounter: 09/03/2017  Primary Cardiologist: Carlyle Dolly, MD   Subjective   She said she is feeling much better.  She is much less short of breath.  She feels like her leg swelling has improved.  She also says she urinated several times.  Inpatient Medications    Scheduled Meds: . apixaban  5 mg Oral BID  . aspirin  81 mg Oral Daily  . carvedilol  18.75 mg Oral BID WC  . furosemide  40 mg Intravenous BID  . sodium chloride flush  3 mL Intravenous Q12H   Continuous Infusions: . sodium chloride     PRN Meds: sodium chloride, acetaminophen, ondansetron (ZOFRAN) IV, sodium chloride flush   Vital Signs    Vitals:   09/02/17 2135 09/03/17 0508 09/03/17 0643 09/03/17 0843  BP: 105/74 113/88    Pulse: 68 (!) 38  (!) 102  Resp: 19 18    Temp: 97.8 F (36.6 C) (!) 97.5 F (36.4 C)    TempSrc: Oral Oral    SpO2: 94% 96%    Weight:   230 lb 6.1 oz (104.5 kg)   Height:        Intake/Output Summary (Last 24 hours) at 09/03/2017 0948 Last data filed at 09/03/2017 0800 Gross per 24 hour  Intake 540 ml  Output 200 ml  Net 340 ml   Filed Weights   09/02/17 1431 09/02/17 1811 09/03/17 0643  Weight: 228 lb (103.4 kg) 233 lb 0.4 oz (105.7 kg) 230 lb 6.1 oz (104.5 kg)    Telemetry    Atrial fibrillation with heart rates in the 90-100 bpm range with PVCs- Personally Reviewed  ECG    No new tracings- Personally Reviewed  Physical Exam   GEN: No acute distress.   Neck: +JVD Cardiac:  Irregular rhythm, no murmurs, rubs, or gallops.  Respiratory:  Bibasilar crackles. GI: Soft, nontender, non-distended  MS:  2+ pitting bilateral lower extremity edema; No deformity. Neuro:  Nonfocal  Psych: Normal affect   Labs    Chemistry Recent Labs  Lab 09/02/17 1512 09/03/17 0411  NA 140 142  K 3.8 4.2  CL 103 106  CO2 24 25  GLUCOSE 138* 120*  BUN 83* 88*  CREATININE 2.36* 2.58*  CALCIUM 8.9 8.9  GFRNONAA 19* 17*    GFRAA 22* 20*  ANIONGAP 13 11     Hematology Recent Labs  Lab 09/02/17 1512  WBC 5.2  RBC 3.59*  HGB 11.2*  HCT 36.6  MCV 101.9*  MCH 31.2  MCHC 30.6  RDW 20.9*  PLT 188    Cardiac Enzymes Recent Labs  Lab 09/02/17 1512 09/02/17 1741 09/03/17 0411 09/03/17 0449  TROPONINI 0.05* 0.05* 0.05* 0.04*   No results for input(s): TROPIPOC in the last 168 hours.   BNP Recent Labs  Lab 09/02/17 1512  BNP >4,500.0*     DDimer No results for input(s): DDIMER in the last 168 hours.   Radiology    Dg Chest 2 View  Result Date: 09/02/2017 CLINICAL DATA:  Worsening shortness of breath over the past 3 days. History of CHF, aortic insufficiency, and coronary artery disease. Former smoker. EXAM: CHEST - 2 VIEW COMPARISON:  PA and lateral chest x-ray of March 25, 2008 FINDINGS: The lungs are mildly hypoinflated. There is a small right pleural effusion and trace left pleural effusion. The cardiac silhouette is enlarged. The pulmonary vascularity is engorged and indistinct. The interstitial markings are increased.  There is calcification in the wall of the aortic arch and descending thoracic aorta. There is multilevel degenerative disc disease of the thoracic spine. There is gentle dextrocurvature centered in the lower thoracic spine. IMPRESSION: CHF with enlargement of the cardiac silhouette, pulmonary vascular congestion, interstitial edema, and small right pleural effusion. One cannot exclude a pericardial effusion in the appropriate clinical setting. There has been an overall deterioration in the appearance of the thorax since the previous study. Thoracic aortic atherosclerosis. Electronically Signed   By: David  Martinique M.D.   On: 09/02/2017 14:59    Cardiac Studies   02/2008 cath FINDINGS: Hemodynamics: Mean right atrial pressure was 13 mmHg. RV 43/9, PA 47/28 with mean PA pressure of 37 mmHg. Mean pulmonary capillary wedge pressure of 23 mmHg. Aorta 181/90. Left  ventricle 177/17/26. Mixed venous O2 saturation was 65%, aortic saturation 92%, SVR was 1355, cardiac output was 6.55 liters per minute. Cardiac index was 3.45. Left ventriculography: Left ventricle was enlarged. There was global severe hypokinesis with an estimated ejection fraction of 25%. Coronary angiography: The right coronary artery was dominant and showed only luminal irregularities. Left main coronary artery was free from significant disease. The circumflex was a large vessel, there were 3 obtuse marginal vessels with a 30% ostial stenosis of the first obtuse marginal. There was a 30% stenosis in the proximal circumflex between the first and second obtuse marginals. The LAD system actually was a dual LAD type system, with a large diagonal that reached the apex. There is a mild 20% stenosis, just prior to the bifurcation of the large first diagonal off the LAD. The remainder of the LAD did not have significant disease. There were luminal irregularities in the large first diagonal. This vessel reached the apex. Non-selective abdominal aortography did show 2 right and 2 left renal arteries, there appeared to be an 80% ostial stenosis of the upper left renal artery. This was confirmed by a selective renal angiography.   02/2014 echo Study Conclusions  - Left ventricle: The cavity size was normal. Wall thickness was increased in a pattern of moderate LVH. Sigmoid shaped basal septum. Systolic function was severely reduced. The estimated ejection fraction was in the range of 25% to 30%. Diffuse hypokinesis. There is severe hypokinesis of the mid-apicalanteroseptal, anterior, and apical myocardium. Doppler parameters are consistent with abnormal left ventricular relaxation (grade 1 diastolic dysfunction). - Ventricular septum: Septal motion showed abnormal function and dyssynergy. - Aortic valve: Mildly calcified annulus.  [place]. There was trivial regurgitation. - Ascending aorta: The ascending aorta was mildly dilated, 43 mm. - Mitral valve: Calcified annulus. There was trivial regurgitation. - Left atrium: The atrium was severely dilated. - Right atrium: Central venous pressure (est): 3 mm Hg. - Atrial septum: No defect or patent foramen ovale was identified. - Tricuspid valve: There was trivial regurgitation. - Pulmonary arteries: Systolic pressure could not be accurately estimated. - Pericardium, extracardiac: There was no pericardial effusion.  Impressions:  - Limited images. Unable to compare with prior study. Moderate LVH with sigmoid shaped basal septum, LVEF approximately 25-30% with diffuse hypokinesis, most severe mid to apical anteroseptal and anterior wall. Septal dyssynegry. Grade 1 diastolic dysfunction. Severe left atrial enlargement. MIldly dilated ascending aorta. Trivial aortic regurgitation. Trivial tricuspid regurgitation, unable to assess PASP.  Jan 2017 echo Study Conclusions  - Left ventricle: The cavity size was normal. Wall thickness was increased in a pattern of severe LVH. Systolic function was severely reduced. The estimated ejection fraction was in the range of  20% to 25%. Diffuse hypokinesis. Doppler parameters are consistent with abnormal left ventricular relaxation (grade 1 diastolic dysfunction). - Aortic valve: Mildly calcified annulus. Mildly thickened leaflets. Valve area (VTI): 2.43 cm^2. Valve area (Vmax): 2.39 cm^2. Valve area (Vmean): 2.28 cm^2. - Mitral valve: Mildly calcified annulus. Mildly thickened leaflets . There was mild regurgitation. - Left atrium: The atrium was moderately dilated. - Technically adequate study.  08/2015 Cath 1. Angiographically normal coronary arteries 2. Nonischemic cardiomyopathy - conflicting cardiac outputs between Fick and thermodilution. Suspect that there is some valvular disease  that is throwing off the thermodilution. 3. Essentially normal right heart pressures. Mildly elevated LVEDP and PCWP, but otherwise normal pressures.   Clearly nonischemic cardiomyopathy. The patient's itching anatomy with a tandem LAD system and codominant.  Plan:  Discharge after bedrest following femoral sheath removal.  Continue current management. She appears euvolemic    Patient Profile     77 y.o. female with chronic systolic heart failure and new onset atrial fibrillation and chronic left bundle branch block who has been hospitalized for acute on chronic systolic heart failure.  Assessment & Plan    1.  Acute on chronic systolic heart failure, LVEF 20 to 25%: I suspect she has developed furosemide tolerance likely due to reduced bioavailability.  She has symptomatically improved.  I am uncertain about I/O accuracy.  She appears to have lost 3 pounds since yesterday.  As BUN and creatinine have mildly elevated, I will reduce IV Lasix to 40 mg twice daily.  She will likely need torsemide at the time of discharge.  If she remains in atrial fibrillation, I would likely plan for an outpatient direct-current cardioversion when she is euvolemic.  She has agreed to an ICD.  She would also benefit from cardiac resynchronization therapy given left bundle branch block.  2.  New onset atrial fibrillation: Heart rate remains in the mid 90s to low 100 bpm range at present.  I would continue carvedilol at present dose.  She is systemically anticoagulated with Eliquis 5 mg twice daily.  If she remains in atrial fibrillation, I would plan for an outpatient direct-current cardioversion once she is euvolemic.  3.  Obstructive sleep apnea: Continue CPAP.    4.  Hypertension: Blood pressure is normal.  This will need continued monitoring given need for IV diuresis.  5.  Left bundle branch block: This is chronic.  QRS duration is 214 ms.  Given severely reduced left ventricular systolic  function, she would benefit from cardiac recentralization therapy.  We discussed this and she has agreed to proceed with this in the outpatient setting.  6.  Chronic kidney disease stage III: BUN 88, creatinine 2.58.  Creatinine was 1.6 on 06/03/2017.  This will need continued monitoring given need for IV diuresis.  I will reduce the dose of Lasix to 40 mg IV twice daily.   For questions or updates, please contact [place] Please consult [email or web address] for contact info under Cardiology/STEMI.      Signed, Kate Sable, MD  09/03/2017, 9:48 AM

## 2017-09-03 NOTE — Telephone Encounter (Signed)
Pt was admitted to [facility]

## 2017-09-04 LAB — BASIC METABOLIC PANEL
Anion gap: 12 (ref 5–15)
BUN: 91 mg/dL — ABNORMAL HIGH (ref 8–23)
CO2: 25 mmol/L (ref 22–32)
Calcium: 8.8 mg/dL — ABNORMAL LOW (ref 8.9–10.3)
Chloride: 106 mmol/L (ref 98–111)
Creatinine, Ser: 2.63 mg/dL — ABNORMAL HIGH (ref 0.44–1.00)
GFR calc Af Amer: 19 mL/min — ABNORMAL LOW (ref >60)
GFR calc non Af Amer: 17 mL/min — ABNORMAL LOW (ref >60)
Glucose, Bld: 130 mg/dL — ABNORMAL HIGH (ref 70–99)
Potassium: 4.2 mmol/L (ref 3.5–5.1)
Sodium: 143 mmol/L (ref 135–145)

## 2017-09-04 NOTE — Progress Notes (Signed)
BP's in low 100's and 90's.  Dr. Manuella Ghazi said to hold afternoon dose of coreg.  Left leg wound has scant amount of drainage and is red but not hot to the touch.

## 2017-09-04 NOTE — Progress Notes (Signed)
PROGRESS NOTE    Gina [name]  SEG:[id number] DOB: 07-27-1940 DOA: 09/02/2017 PCP: Glenda Chroman, MD   Brief Narrative:   Gina [name] is a 77 y.o. female with medical history significant of systolic congestive heart failure with an EF of 20%, chronic kidney disease with a baseline creatinine of 1.6, A. fib, coronary artery disease, hypertension comes in with several days of progressive worsening swelling and shortness of breath.  Patient reports his swelling her legs have been increasing for about 2 weeks really worse the last couple days.  She was admitted with acute CHF exacerbation for which she has been placed on Lasix for diuresis.    Assessment & Plan:   Principal Problem:   CHF exacerbation ([place]) Active Problems:   Aortic regurgitation   Hypertension   Renal artery stenosis (HCC)   Atrial fibrillation (HCC)   AKI (acute kidney injury) ([place])   1. Acute on chronic systolic CHF decompensation.  Appreciate cardiology recommendations for continued diuresis with IV Lasix now at 40 mg twice daily as BUN and creatinine appear to be stabilizing today.  Weight appears to fluctuate as well as daily I's and O's which both appear to be inaccurate.  She is noted to have an actual increase in weight today.  She appears to be urinating frequently, however.  She overall does not feel as well as she did the day prior and will require continued diuresis. 2. New onset atrial fibrillation.  Heart rate remains controlled in the low 100 bpm range.  Continue carvedilol and Eliquis for anticoagulation.  Plans are for outpatient direct current cardioversion once euvolemic.   3. Hypertension.  Monitor carefully with ongoing diuresis as dose of Coreg was held in the evening due to some mild hypotension. 4. CKD stage III.  Continue diuresis at current dose.  Creatinine appears to be stabilizing and will recheck labs in a.m. 5. OSA.  Continue CPAP.   DVT prophylaxis: On Eliquis Code Status: Full Family  Communication: None at bedside Disposition Plan: Continue gentle diuresis with plans for discharge in the next 24 to 48 hours   Consultants:   Cardiology  Procedures:   None  Antimicrobials:   None   Subjective: Patient seen and evaluated today with some ongoing fatigue and shortness of breath along with lower extremity edema that appears persistent.  She appears to feel slightly worse than the day prior, but continues to have frequent urination.  Objective: Vitals:   09/03/17 1959 09/03/17 2109 09/04/17 0500 09/04/17 0605  BP:  113/85  111/65  Pulse:  (!) 102  (!) 101  Resp:  20  20  Temp:  98.6 F (37 C)  97.9 F (36.6 C)  TempSrc:  Oral  Oral  SpO2: 92% (!) 74%  92%  Weight:   105.6 kg (232 lb 12.9 oz)   Height:        Intake/Output Summary (Last 24 hours) at 09/04/2017 1240 Last data filed at 09/04/2017 1022 Gross per 24 hour  Intake 1080 ml  Output 600 ml  Net 480 ml   Filed Weights   09/02/17 1811 09/03/17 0643 09/04/17 0500  Weight: 105.7 kg (233 lb 0.4 oz) 104.5 kg (230 lb 6.1 oz) 105.6 kg (232 lb 12.9 oz)    Examination:  General exam: Appears calm and comfortable  Respiratory system: Clear to auscultation. Respiratory effort normal. Cardiovascular system: S1 & S2 heard, RRR. No JVD, murmurs, rubs, gallops or clicks.  Continues to have 1-2+ pitting edema. Gastrointestinal  system: Abdomen is nondistended, soft and nontender. No organomegaly or masses felt. Normal bowel sounds heard. Central nervous system: Alert and oriented. No focal neurological deficits. Extremities: Symmetric 5 x 5 power. Skin: No rashes, lesions or ulcers Psychiatry: Judgement and insight appear normal. Mood & affect appropriate.     Data Reviewed: I have personally reviewed following labs and imaging studies  CBC: Recent Labs  Lab 09/02/17 1512  WBC 5.2  NEUTROABS 3.9  HGB 11.2*  HCT 36.6  MCV 101.9*  PLT 616   Basic Metabolic Panel: Recent Labs  Lab 09/02/17 1512  09/03/17 0411 09/04/17 0612  NA 140 142 143  K 3.8 4.2 4.2  CL 103 106 106  CO2 24 25 25   GLUCOSE 138* 120* 130*  BUN 83* 88* 91*  CREATININE 2.36* 2.58* 2.63*  CALCIUM 8.9 8.9 8.8*   GFR: Estimated Creatinine Clearance: 21.6 mL/min (A) (by C-G formula based on SCr of 2.63 mg/dL (H)). Liver Function Tests: No results for input(s): AST, ALT, ALKPHOS, BILITOT, PROT, ALBUMIN in the last 168 hours. No results for input(s): LIPASE, AMYLASE in the last 168 hours. No results for input(s): AMMONIA in the last 168 hours. Coagulation Profile: No results for input(s): INR, PROTIME in the last 168 hours. Cardiac Enzymes: Recent Labs  Lab 09/02/17 1512 09/02/17 1741 09/03/17 0411 09/03/17 0449 09/03/17 1044  TROPONINI 0.05* 0.05* 0.05* 0.04* 0.04*   BNP (last 3 results) No results for input(s): PROBNP in the last 8760 hours. HbA1C: No results for input(s): HGBA1C in the last 72 hours. CBG: No results for input(s): GLUCAP in the last 168 hours. Lipid Profile: No results for input(s): CHOL, HDL, LDLCALC, TRIG, CHOLHDL, LDLDIRECT in the last 72 hours. Thyroid Function Tests: No results for input(s): TSH, T4TOTAL, FREET4, T3FREE, THYROIDAB in the last 72 hours. Anemia Panel: No results for input(s): VITAMINB12, FOLATE, FERRITIN, TIBC, IRON, RETICCTPCT in the last 72 hours. Sepsis Labs: No results for input(s): PROCALCITON, LATICACIDVEN in the last 168 hours.  No results found for this or any previous visit (from the past 240 hour(s)).       Radiology Studies: Dg Chest 2 View  Result Date: 09/02/2017 CLINICAL DATA:  Worsening shortness of breath over the past 3 days. History of CHF, aortic insufficiency, and coronary artery disease. Former smoker. EXAM: CHEST - 2 VIEW COMPARISON:  PA and lateral chest x-ray of March 25, 2008 FINDINGS: The lungs are mildly hypoinflated. There is a small right pleural effusion and trace left pleural effusion. The cardiac silhouette is enlarged. The  pulmonary vascularity is engorged and indistinct. The interstitial markings are increased. There is calcification in the wall of the aortic arch and descending thoracic aorta. There is multilevel degenerative disc disease of the thoracic spine. There is gentle dextrocurvature centered in the lower thoracic spine. IMPRESSION: CHF with enlargement of the cardiac silhouette, pulmonary vascular congestion, interstitial edema, and small right pleural effusion. One cannot exclude a pericardial effusion in the appropriate clinical setting. There has been an overall deterioration in the appearance of the thorax since the previous study. Thoracic aortic atherosclerosis. Electronically Signed   By: David  Martinique M.D.   On: 09/02/2017 14:59        Scheduled Meds: . apixaban  5 mg Oral BID  . aspirin  81 mg Oral Daily  . carvedilol  18.75 mg Oral BID WC  . furosemide  40 mg Intravenous BID  . sodium chloride flush  3 mL Intravenous Q12H   Continuous Infusions: . sodium  chloride       LOS: 2 days    Time spent: 30 minutes    Conswella Bruney Darleen Crocker, DO [facility] Pager [phone]  If 7PM-7AM, please contact night-coverage [email or web address] Password [email or web address] 09/04/2017, 12:40 PM

## 2017-09-04 NOTE — Progress Notes (Signed)
Pt has home CPAP plugged into red outlet with 2L O2 in line. No signs of damage to machine or cords

## 2017-09-05 DIAGNOSIS — I5043 Acute on chronic combined systolic (congestive) and diastolic (congestive) heart failure: Secondary | ICD-10-CM

## 2017-09-05 LAB — BASIC METABOLIC PANEL
Anion gap: 12 (ref 5–15)
BUN: 95 mg/dL — ABNORMAL HIGH (ref 8–23)
CO2: 23 mmol/L (ref 22–32)
Calcium: 8.7 mg/dL — ABNORMAL LOW (ref 8.9–10.3)
Chloride: 105 mmol/L (ref 98–111)
Creatinine, Ser: 2.8 mg/dL — ABNORMAL HIGH (ref 0.44–1.00)
GFR calc Af Amer: 18 mL/min — ABNORMAL LOW (ref >60)
GFR calc non Af Amer: 15 mL/min — ABNORMAL LOW (ref >60)
Glucose, Bld: 121 mg/dL — ABNORMAL HIGH (ref 70–99)
Potassium: 4.8 mmol/L (ref 3.5–5.1)
Sodium: 140 mmol/L (ref 135–145)

## 2017-09-05 MED ORDER — FUROSEMIDE 10 MG/ML IJ SOLN
80.0000 mg | Freq: Two times a day (BID) | INTRAMUSCULAR | Status: DC
  Administered 2017-09-05 – 2017-09-06 (×2): 80 mg via INTRAVENOUS
  Filled 2017-09-05 (×2): qty 8

## 2017-09-05 NOTE — Care Management Important Message (Signed)
Important Message  Patient Details  Name: Gina [name] MRN: [id number] Date of Birth: 1940/12/31   Medicare Important Message Given:  Yes    Gabryel Talamo, Chauncey Reading, RN 09/05/2017, 12:02 PM

## 2017-09-05 NOTE — Care Management (Signed)
Santiago Glad of [employer], aware patient will not DC today. Resumption order has been placed.

## 2017-09-05 NOTE — Progress Notes (Signed)
Progress Note  Patient Name: Gina [name] Date of Encounter: 09/05/2017  Primary Cardiologist: Carlyle Dolly, MD   Subjective   She feels better than she did yesterday but legs are still swollen.  She feels her shortness of breath has improved.  Inpatient Medications    Scheduled Meds: . apixaban  5 mg Oral BID  . aspirin  81 mg Oral Daily  . carvedilol  18.75 mg Oral BID WC  . furosemide  40 mg Intravenous BID  . sodium chloride flush  3 mL Intravenous Q12H   Continuous Infusions: . sodium chloride     PRN Meds: sodium chloride, acetaminophen, ondansetron (ZOFRAN) IV, sodium chloride flush   Vital Signs    Vitals:   09/04/17 1449 09/04/17 2112 09/04/17 2218 09/05/17 0615  BP: 93/63  98/62 125/70  Pulse: 86  87 75  Resp: 18  20 14   Temp: 98.4 F (36.9 C)  99 F (37.2 C) 97.6 F (36.4 C)  TempSrc:   Oral Oral  SpO2: 93% 95% 94% 98%  Weight:    235 lb 0.2 oz (106.6 kg)  Height:        Intake/Output Summary (Last 24 hours) at 09/05/2017 0950 Last data filed at 09/04/2017 1900 Gross per 24 hour  Intake 840 ml  Output 500 ml  Net 340 ml   Filed Weights   09/03/17 0643 09/04/17 0500 09/05/17 0615  Weight: 230 lb 6.1 oz (104.5 kg) 232 lb 12.9 oz (105.6 kg) 235 lb 0.2 oz (106.6 kg)    Telemetry    Atrial fibrillation with heart rates in the 90-100 bpm range- Personally Reviewed   Physical Exam   GEN: No acute distress.   Neck: No JVD Cardiac:  Irregular rhythm, no murmurs, rubs, or gallops.  Respiratory:  Faint bibasilar crackles. GI: Soft, nontender, non-distended  MS:  2+ pitting bilateral lower extremity edema edema; No deformity. Neuro:  Nonfocal  Psych: Normal affect   Labs    Chemistry Recent Labs  Lab 09/03/17 0411 09/04/17 0612 09/05/17 0425  NA 142 143 140  K 4.2 4.2 4.8  CL 106 106 105  CO2 25 25 23   GLUCOSE 120* 130* 121*  BUN 88* 91* 95*  CREATININE 2.58* 2.63* 2.80*  CALCIUM 8.9 8.8* 8.7*  GFRNONAA 17* 17* 15*  GFRAA 20*  19* 18*  ANIONGAP 11 12 12      Hematology Recent Labs  Lab 09/02/17 1512  WBC 5.2  RBC 3.59*  HGB 11.2*  HCT 36.6  MCV 101.9*  MCH 31.2  MCHC 30.6  RDW 20.9*  PLT 188    Cardiac Enzymes Recent Labs  Lab 09/02/17 1741 09/03/17 0411 09/03/17 0449 09/03/17 1044  TROPONINI 0.05* 0.05* 0.04* 0.04*   No results for input(s): TROPIPOC in the last 168 hours.   BNP Recent Labs  Lab 09/02/17 1512  BNP >4,500.0*     DDimer No results for input(s): DDIMER in the last 168 hours.   Radiology    No results found.  Cardiac Studies   No new studies this hospitalization, previous cardiac studies reviewed in my notes earlier this week  Patient Profile     77 y.o. female with chronic systolic heart failure and new onset atrial fibrillation and chronic left bundle branch block who has been hospitalized for acute on chronic systolic heart failure.  Assessment & Plan    1. Acute on chronic systolic heart failure, LVEF 20 to 25%:I suspect she has developed furosemide tolerance likely due to  reduced bioavailability.   While her symptoms have somewhat improved since yesterday with respect to shortness of breath, her legs are still markedly edematous.  There is 400 cc of urine of unrecorded output in the canister.  Weight is the same as yesterday. BUN up to 95 and creatinine up to 2.8 in spite of reducing IV Lasix to 40 mg twice daily on 7/3.  I spoke with internal medicine and I recommend nephrology involvement. She will likely need torsemide at the time of discharge. If she remains in atrial fibrillation, I would likely plan for an outpatient direct-current cardioversion when she is euvolemic. She has agreed to an ICD. She would also benefit from cardiac resynchronization therapy given left bundle branch block.  2. New onset atrial fibrillation:Heart rate remains in the mid 90s to low 100 bpm range at present. I would continue carvedilol at present dose. She is systemically  anticoagulated with Eliquis 5 mg twice daily. If she remains in atrial fibrillation, I would plan for an outpatient direct-current cardioversion once she is euvolemic.  3. Obstructive sleep apnea: Continue CPAP.   4. Hypertension:Blood pressure is normal.   5. Left bundle branch block: This is chronic. QRS duration is 214 ms. Given severely reduced left ventricular systolic function, she would benefit from cardiac recentralization therapy. We discussed this and she has agreed to proceed with this in the outpatient setting.  6. Chronic kidney disease stage III: BUN is up to 95 and creatinine is up to 2.8 in spite of reducing IV Lasix to 40 mg twice daily on 7/3.  She is also still markedly edematous.  I recommend nephrology involvement.  I will not make adjustments to her diuretics today.     For questions or updates, please contact [place] Please consult [email or web address] for contact info under Cardiology/STEMI.      Signed, Kate Sable, MD  09/05/2017, 9:50 AM

## 2017-09-05 NOTE — Consult Note (Signed)
Reason for Consult: Worsening of renal failure Referring Physician: Dr. Kateri Mc is an 77 y.o. female.  HPI: She is a patient who has history of systolic dysfunction, cardiomyopathy, hypertension, chronic renal failure presently came with complaints of difficulty breathing and increased leg swelling after her diuretics was discontinued because of worsening of her renal failure.  Patient had history of chronic renal failure for more than 10 years.  Initially she was seen at [facility] for acute kidney injury.  Since then patient has been followed by nephrologist in [place].  Presently she denies any nausea or vomiting.  Her breathing is somewhat better.  Past Medical History:  Diagnosis Date  . Acute renal failure [facility])     hospitalized... December 08, 2009... improved with hydration in the hospital  . Anemia    further workup needed... October, 2011.  [name] Aortic insufficiency    mild...echo... June 2 010  . Arthritis    Hips/Knees, severe, requiring a walker  . Asymmetric septal hypertrophy (HCC)    echo....04/2009....patient encouraged the family to be screened elsewhere  . Atrial fibrillation ([place])     ??? atrial fibrillation during hospitalization October, 2011 ???..  . Bradycardia   . CAD (coronary artery disease)    Mild coronary disease, catheterization, 2009  . Cardiomyopathy, nonischemic ([place])    EF improved October, 2011  . Ejection fraction    EF 25%...nondiagnostic MRI December 2009...  /  echo June, 2010  /   echo... December 08, 2009.... ejection fraction improved... EF 60% /  EF 30%... echo.... [place]... December 28, 2009 with CHFPLanned ICD / CRT... patient has seen Dr.Klein..EF improved October, 2011   . Groin hematoma    Right-hematoma/abscess..repaired.. December, 2009  . Hypertension   . LBBB (left bundle branch block)   . LVH (left ventricular hypertrophy)    moderately severe... echo.. June, 2010  /  EF improved October, 2011.... cancel plans  for ICD  . Mitral regurgitation    mild/ moderate...echo June, 2010  /   echo.. October, 2011.... mitral regurgitation  improved  . OSA (obstructive sleep apnea)   . Renal artery stenosis (HCC)    80% upper left   . Systolic heart failure    chronic    Past Surgical History:  Procedure Laterality Date  . ABDOMINAL HYSTERECTOMY    . CARDIAC CATHETERIZATION N/A 08/14/2015   Procedure: Right/Left Heart Cath and Coronary Angiography;  Surgeon: Leonie Man, MD;  Location: [facility];  Service: Cardiovascular;  Laterality: N/A;  . REPLACEMENT TOTAL HIP W/  RESURFACING IMPLANTS    . REPLACEMENT TOTAL KNEE    . Surgical Repair of a Catheteriztion associated femoral artery injury      Family History  Problem Relation Age of Onset  . Rheumatic fever Mother        in her early 6's  . Alzheimer's disease Mother   . Stomach cancer Father 10       died 58  . Bradycardia Brother        has pacemaker  . Heart failure Paternal Grandmother   . Heart failure Paternal Grandfather     Social History:  reports that she quit smoking about 31 years ago. Her smoking use included cigarettes. She started smoking about 58 years ago. She has a 24.00 pack-year smoking history. She has never used smokeless tobacco. She reports that she does not drink alcohol. Her drug history is not on file.  Allergies:  Allergies  Allergen Reactions  . Cephalexin     REACTION: Rash to arms/legs    Medications: I have reviewed the patient's current medications.  Results for orders placed or performed during the hospital encounter of 09/02/17 (from the past 48 hour(s))  Basic metabolic panel     Status: Abnormal   Collection Time: 09/04/17  6:12 AM  Result Value Ref Range   Sodium 143 135 - 145 mmol/L   Potassium 4.2 3.5 - 5.1 mmol/L   Chloride 106 98 - 111 mmol/L    Comment: Please note change in reference range.   CO2 25 22 - 32 mmol/L   Glucose, Bld 130 (H) 70 - 99 mg/dL    Comment: Please note  change in reference range.   BUN 91 (H) 8 - 23 mg/dL    Comment: Please note change in reference range.   Creatinine, Ser 2.63 (H) 0.44 - 1.00 mg/dL   Calcium 8.8 (L) 8.9 - 10.3 mg/dL   GFR calc non Af Amer 17 (L) >60 mL/min   GFR calc Af Amer 19 (L) >60 mL/min    Comment: (NOTE) The eGFR has been calculated using the CKD EPI equation. This calculation has not been validated in all clinical situations. eGFR's persistently <60 mL/min signify possible Chronic Kidney Disease.    Anion gap 12 5 - 15    Comment: Performed at [facility], [address] [place], [place] [address]  Basic metabolic panel     Status: Abnormal   Collection Time: 09/05/17  4:25 AM  Result Value Ref Range   Sodium 140 135 - 145 mmol/L   Potassium 4.8 3.5 - 5.1 mmol/L   Chloride 105 98 - 111 mmol/L    Comment: Please note change in reference range.   CO2 23 22 - 32 mmol/L   Glucose, Bld 121 (H) 70 - 99 mg/dL    Comment: Please note change in reference range.   BUN 95 (H) 8 - 23 mg/dL    Comment: Please note change in reference range.   Creatinine, Ser 2.80 (H) 0.44 - 1.00 mg/dL   Calcium 8.7 (L) 8.9 - 10.3 mg/dL   GFR calc non Af Amer 15 (L) >60 mL/min   GFR calc Af Amer 18 (L) >60 mL/min    Comment: (NOTE) The eGFR has been calculated using the CKD EPI equation. This calculation has not been validated in all clinical situations. eGFR's persistently <60 mL/min signify possible Chronic Kidney Disease.    Anion gap 12 5 - 15    Comment: Performed at [facility], [address] [place], [place] [address]    No results found.  Review of Systems  Constitutional: Negative for chills.  HENT: Positive for congestion.   Respiratory: Positive for shortness of breath.   Cardiovascular: Positive for leg swelling.  Gastrointestinal: Negative for nausea and vomiting.   Blood pressure 125/70, pulse 75, temperature 97.6 F (36.4 C), temperature source Oral, resp. rate 14, height 5' 4" (1.626 m), weight 106.6  kg (235 lb 0.2 oz), SpO2 98 %. Physical Exam  Constitutional: She is oriented to person, place, and time. No distress.  Neck: No JVD present.  Cardiovascular: Normal rate and regular rhythm.  Murmur heard. Respiratory: She has no wheezes. She has rales.  GI: There is no tenderness. There is no rebound.  Musculoskeletal: She exhibits edema.  Neurological: She is alert and oriented to person, place, and time.    Assessment/Plan: 1] renal failure: Possibly acute on chronic versus natural   progression of her chronic renal failure.  Patient had long-standing history of chronic renal failure since 2009.  Her creatinine was 1.09 on 02/23/2008.  Since patient has been taking care of by nephrologist in [facility] there is no other blood work until 2017.  During that time her creatinine was 1.6 with EGFR of 36 cc/min./1.73.  Hence stage III chronic renal failure.  Patient had an ultrasound on 09/25/2016 which showed right kidney to be 10.8 cm with some cortical atrophy and increased echogenicity.  Left kidney was 8.6 with mild left renal atrophy, cortical thining and increased echogenicity.  Hence patient with any chronic kidney.  Etiology at this moment not clear.  Questionable renal artery stenosis.  The present increase in BUN and creatinine could be just from fluid removal rather than worsening of her renal failure.  As stated above patient is being followed by nephrologist. 2] history of CHF: Patient with history of chronic systolic heart failure.  She has been on diuretics including Aldactone until it was discontinued when she was admitted to [facility].  Presently patient is feeling better but still she seems to have significant leg swelling. 3] history of cardial myopathy: Her ejection fraction is 20 to 25% 4] hypertension: Her blood pressure is reasonably controlled 5] history of OSA 6] history of new onset atrial fibrillation 7] bone and mineral disorder: Her calcium is range Plan: 1]  since patient has chronic renal failure at this moment no further work-up is needed. 2] we will increase Lasix to 80 mg IV twice daily 3] we will check a renal panel in the morning 4] if patient improves we will switch her to oral diuretics in the morning.  Evangelina Delancey S 09/05/2017, 11:27 AM

## 2017-09-05 NOTE — Progress Notes (Signed)
PROGRESS NOTE    Gina [name]  MOQ:[id number] DOB: Oct 02, 1940 DOA: 09/02/2017 PCP: Glenda Chroman, MD   Brief Narrative:   Gina [name] is a 77 y.o. female with medical history significant of systolic congestive heart failure with an EF of 20%, chronic kidney disease with a baseline creatinine of 1.6, A. fib, coronary artery disease, hypertension comes in with several days of progressive worsening swelling and shortness of breath.  Patient reports his swelling her legs have been increasing for about 2 weeks really worse the last couple days.  She was admitted with acute CHF exacerbation for which she has been placed on Lasix for diuresis.    Assessment & Plan:   Principal Problem:   CHF exacerbation ([place]) Active Problems:   Aortic regurgitation   Hypertension   Renal artery stenosis (HCC)   Atrial fibrillation (HCC)   AKI (acute kidney injury) ([place])   1. Acute on chronic systolic CHF decompensation-with ongoing volume overload.  Appreciate cardiology recommendations for continued diuresis with IV Lasix now at 40 mg twice daily as BUN and creatinine appear to be stabilizing today.  Weight is unchanged and I/O's are inaccurate. Creatinine trending upwards despite reduction in Lasix dose (now 2.8).  Will consult Nephrology as recommended by Cardiology today. 2. New onset atrial fibrillation.  Heart rate remains controlled in the low 100 bpm range.  Continue carvedilol and Eliquis for anticoagulation.  Plans are for outpatient direct current cardioversion once euvolemic.   3. Hypertension.  Monitor carefully with ongoing diuresis as dose of Coreg was held in the evening due to some mild hypotension. 4. AKI on CKD stage III.  Continue diuresis at current dose.  Repeat labs with upward trend in Creatinine noted. Consult to Nephrology today. 5. OSA.  Continue CPAP.   DVT prophylaxis: On Eliquis Code Status: Full Family Communication: None at bedside Disposition Plan: Continue ongoing  Lasix diuresis. Plans for Nephrology evaluation today due to worsening kidney function and persistent volume overload.    Consultants:   Cardiology  Nephrology  Procedures:   None  Antimicrobials:   None   Subjective: Patient seen and evaluated today with some ongoing fatigue and shortness of breath along with lower extremity edema that appears persistent.  She can't say one way or the other if she is improved or not.  Objective: Vitals:   09/04/17 1449 09/04/17 2112 09/04/17 2218 09/05/17 0615  BP: 93/63  98/62 125/70  Pulse: 86  87 75  Resp: 18  20 14   Temp: 98.4 F (36.9 C)  99 F (37.2 C) 97.6 F (36.4 C)  TempSrc:   Oral Oral  SpO2: 93% 95% 94% 98%  Weight:    106.6 kg (235 lb 0.2 oz)  Height:        Intake/Output Summary (Last 24 hours) at 09/05/2017 1029 Last data filed at 09/04/2017 1900 Gross per 24 hour  Intake 480 ml  Output 500 ml  Net -20 ml   Filed Weights   09/03/17 0643 09/04/17 0500 09/05/17 0615  Weight: 104.5 kg (230 lb 6.1 oz) 105.6 kg (232 lb 12.9 oz) 106.6 kg (235 lb 0.2 oz)    Examination:  General exam: Appears calm and comfortable  Respiratory system: Clear to auscultation. Respiratory effort normal. Cardiovascular system: S1 & S2 heard, RRR. No JVD, murmurs, rubs, gallops or clicks.  Continues to have 1-2+ pitting edema. Gastrointestinal system: Abdomen is nondistended, soft and nontender. No organomegaly or masses felt. Normal bowel sounds heard. Central nervous system:  Alert and oriented. No focal neurological deficits. Extremities: Symmetric 5 x 5 power. Skin: No rashes, lesions or ulcers Psychiatry: Judgement and insight appear normal. Mood & affect appropriate.     Data Reviewed: I have personally reviewed following labs and imaging studies  CBC: Recent Labs  Lab 09/02/17 1512  WBC 5.2  NEUTROABS 3.9  HGB 11.2*  HCT 36.6  MCV 101.9*  PLT 837   Basic Metabolic Panel: Recent Labs  Lab 09/02/17 1512 09/03/17 0411  09/04/17 0612 09/05/17 0425  NA 140 142 143 140  K 3.8 4.2 4.2 4.8  CL 103 106 106 105  CO2 24 25 25 23   GLUCOSE 138* 120* 130* 121*  BUN 83* 88* 91* 95*  CREATININE 2.36* 2.58* 2.63* 2.80*  CALCIUM 8.9 8.9 8.8* 8.7*   GFR: Estimated Creatinine Clearance: 20.4 mL/min (A) (by C-G formula based on SCr of 2.8 mg/dL (H)). Liver Function Tests: No results for input(s): AST, ALT, ALKPHOS, BILITOT, PROT, ALBUMIN in the last 168 hours. No results for input(s): LIPASE, AMYLASE in the last 168 hours. No results for input(s): AMMONIA in the last 168 hours. Coagulation Profile: No results for input(s): INR, PROTIME in the last 168 hours. Cardiac Enzymes: Recent Labs  Lab 09/02/17 1512 09/02/17 1741 09/03/17 0411 09/03/17 0449 09/03/17 1044  TROPONINI 0.05* 0.05* 0.05* 0.04* 0.04*   BNP (last 3 results) No results for input(s): PROBNP in the last 8760 hours. HbA1C: No results for input(s): HGBA1C in the last 72 hours. CBG: No results for input(s): GLUCAP in the last 168 hours. Lipid Profile: No results for input(s): CHOL, HDL, LDLCALC, TRIG, CHOLHDL, LDLDIRECT in the last 72 hours. Thyroid Function Tests: No results for input(s): TSH, T4TOTAL, FREET4, T3FREE, THYROIDAB in the last 72 hours. Anemia Panel: No results for input(s): VITAMINB12, FOLATE, FERRITIN, TIBC, IRON, RETICCTPCT in the last 72 hours. Sepsis Labs: No results for input(s): PROCALCITON, LATICACIDVEN in the last 168 hours.  No results found for this or any previous visit (from the past 240 hour(s)).       Radiology Studies: No results found.      Scheduled Meds: . apixaban  5 mg Oral BID  . aspirin  81 mg Oral Daily  . carvedilol  18.75 mg Oral BID WC  . furosemide  40 mg Intravenous BID  . sodium chloride flush  3 mL Intravenous Q12H   Continuous Infusions: . sodium chloride       LOS: 3 days    Time spent: 30 minutes    Kali Deadwyler Darleen Crocker, DO [facility] Pager [phone]  If  7PM-7AM, please contact night-coverage [email or web address] Password [email or web address] 09/05/2017, 10:29 AM

## 2017-09-06 LAB — RENAL FUNCTION PANEL
Albumin: 3.1 g/dL — ABNORMAL LOW (ref 3.5–5.0)
Anion gap: 12 (ref 5–15)
BUN: 96 mg/dL — ABNORMAL HIGH (ref 8–23)
CO2: 26 mmol/L (ref 22–32)
Calcium: 8.4 mg/dL — ABNORMAL LOW (ref 8.9–10.3)
Chloride: 103 mmol/L (ref 98–111)
Creatinine, Ser: 2.51 mg/dL — ABNORMAL HIGH (ref 0.44–1.00)
GFR calc Af Amer: 20 mL/min — ABNORMAL LOW (ref >60)
GFR calc non Af Amer: 18 mL/min — ABNORMAL LOW (ref >60)
Glucose, Bld: 98 mg/dL (ref 70–99)
Phosphorus: 5 mg/dL — ABNORMAL HIGH (ref 2.5–4.6)
Potassium: 4 mmol/L (ref 3.5–5.1)
Sodium: 141 mmol/L (ref 135–145)

## 2017-09-06 LAB — CBC
HCT: 36.5 % (ref 36.0–46.0)
Hemoglobin: 10.7 g/dL — ABNORMAL LOW (ref 12.0–15.0)
MCH: 30.7 pg (ref 26.0–34.0)
MCHC: 29.3 g/dL — ABNORMAL LOW (ref 30.0–36.0)
MCV: 104.6 fL — ABNORMAL HIGH (ref 78.0–100.0)
Platelets: 156 10*3/uL (ref 150–400)
RBC: 3.49 MIL/uL — ABNORMAL LOW (ref 3.87–5.11)
RDW: 21.4 % — ABNORMAL HIGH (ref 11.5–15.5)
WBC: 7.4 10*3/uL (ref 4.0–10.5)

## 2017-09-06 MED ORDER — TORSEMIDE 20 MG PO TABS
60.0000 mg | ORAL_TABLET | Freq: Every day | ORAL | Status: DC
  Administered 2017-09-06 – 2017-09-09 (×4): 60 mg via ORAL
  Filled 2017-09-06 (×4): qty 3

## 2017-09-06 MED ORDER — BISACODYL 5 MG PO TBEC
10.0000 mg | DELAYED_RELEASE_TABLET | Freq: Every day | ORAL | Status: DC | PRN
  Administered 2017-09-07 – 2017-09-21 (×3): 10 mg via ORAL
  Filled 2017-09-06 (×3): qty 2

## 2017-09-06 MED ORDER — CARVEDILOL 3.125 MG PO TABS: 6.2500 mg | ORAL_TABLET | Freq: Two times a day (BID) | ORAL | Status: DC

## 2017-09-06 NOTE — Progress Notes (Addendum)
PROGRESS NOTE    Gina [name]  PNT:[id number] DOB: [birth year] DOA: 09/02/2017 PCP: Glenda Chroman, MD   Brief Narrative:   Gina [name] is a 77 y.o. female with medical history significant of systolic congestive heart failure with an EF of 20%, chronic kidney disease with a baseline creatinine of 1.6, A. fib, coronary artery disease, hypertension comes in with several days of progressive worsening swelling and shortness of breath.  Patient reports his swelling her legs have been increasing for about 2 weeks really worse the last couple days.  She was admitted with acute CHF exacerbation for which she has been placed on Lasix for diuresis.    Assessment & Plan:   Principal Problem:   CHF exacerbation ([place]) Active Problems:   Aortic regurgitation   Hypertension   Renal artery stenosis (HCC)   Atrial fibrillation (HCC)   AKI (acute kidney injury) ([place])   1. Acute on chronic systolic CHF decompensation-with ongoing volume overload.  Last ejection fraction of 20 to 25%.  Cardiology following that she has been diuresing with IV Lasix.  Nephrology has also been following.  She has been transitioned to oral Demadex today.  Continue to monitor urine output.  She still has some evidence of volume overload.   2. New onset atrial fibrillation.  Heart rate remains controlled in the low 100 bpm range.  Continue carvedilol and Eliquis for anticoagulation.  Plans are for outpatient direct current cardioversion once euvolemic.   3. Hypertension.  Blood pressures are running low.  Continue Coreg at lower dose, but with holding parameters 4. AKI on CKD stage III.  Mild improvement of creatinine today.  Nephrology following.  Current treatments 5. OSA.  Continue CPAP.   DVT prophylaxis: On Eliquis Code Status: Full Family Communication: None at bedside Disposition Plan: Plan will be to discharge home once adequately diuresed.    Consultants:   Cardiology  Nephrology  Procedures:    None  Antimicrobials:   None   Subjective: Overall she is feeling better.  She feels weak and has not ambulated.  But her breathing is improving.  No chest pain.  Objective: Vitals:   09/05/17 2100 09/06/17 0442 09/06/17 0626 09/06/17 1300  BP: 96/68  (!) 87/60 (!) 89/58  Pulse: 90 90 78 (!) 51  Resp: 15 16 19 20   Temp: 98.5 F (36.9 C)  97.7 F (36.5 C) 98.6 F (37 C)  TempSrc: Oral  Oral Oral  SpO2: 93% 96% 100% 96%  Weight:   108.1 kg (238 lb 5.1 oz)   Height:        Intake/Output Summary (Last 24 hours) at 09/06/2017 1712 Last data filed at 09/06/2017 1200 Gross per 24 hour  Intake 480 ml  Output 650 ml  Net -170 ml   Filed Weights   09/04/17 0500 09/05/17 0615 09/06/17 0626  Weight: 105.6 kg (232 lb 12.9 oz) 106.6 kg (235 lb 0.2 oz) 108.1 kg (238 lb 5.1 oz)    Examination:  General exam: Alert, awake, oriented x 3 Respiratory system: Clear to auscultation. Respiratory effort normal. Cardiovascular system: Irregular. No murmurs, rubs, gallops. Gastrointestinal system: Abdomen is nondistended, soft and nontender. No organomegaly or masses felt. Normal bowel sounds heard. Central nervous system: Alert and oriented. No focal neurological deficits. Extremities: 1-2+ pitting edema bilaterally Skin: No rashes, lesions or ulcers Psychiatry: Judgement and insight appear normal. Mood & affect appropriate.      Data Reviewed: I have personally reviewed following labs and imaging studies  CBC: Recent Labs  Lab 09/02/17 1512 09/06/17 0606  WBC 5.2 7.4  NEUTROABS 3.9  --   HGB 11.2* 10.7*  HCT 36.6 36.5  MCV 101.9* 104.6*  PLT 188 517   Basic Metabolic Panel: Recent Labs  Lab 09/02/17 1512 09/03/17 0411 09/04/17 0612 09/05/17 0425 09/06/17 0606  NA 140 142 143 140 141  K 3.8 4.2 4.2 4.8 4.0  CL 103 106 106 105 103  CO2 24 25 25 23 26   GLUCOSE 138* 120* 130* 121* 98  BUN 83* 88* 91* 95* 96*  CREATININE 2.36* 2.58* 2.63* 2.80* 2.51*  CALCIUM 8.9  8.9 8.8* 8.7* 8.4*  PHOS  --   --   --   --  5.0*   GFR: Estimated Creatinine Clearance: 22.9 mL/min (A) (by C-G formula based on SCr of 2.51 mg/dL (H)). Liver Function Tests: Recent Labs  Lab 09/06/17 0606  ALBUMIN 3.1*   No results for input(s): LIPASE, AMYLASE in the last 168 hours. No results for input(s): AMMONIA in the last 168 hours. Coagulation Profile: No results for input(s): INR, PROTIME in the last 168 hours. Cardiac Enzymes: Recent Labs  Lab 09/02/17 1512 09/02/17 1741 09/03/17 0411 09/03/17 0449 09/03/17 1044  TROPONINI 0.05* 0.05* 0.05* 0.04* 0.04*   BNP (last 3 results) No results for input(s): PROBNP in the last 8760 hours. HbA1C: No results for input(s): HGBA1C in the last 72 hours. CBG: No results for input(s): GLUCAP in the last 168 hours. Lipid Profile: No results for input(s): CHOL, HDL, LDLCALC, TRIG, CHOLHDL, LDLDIRECT in the last 72 hours. Thyroid Function Tests: No results for input(s): TSH, T4TOTAL, FREET4, T3FREE, THYROIDAB in the last 72 hours. Anemia Panel: No results for input(s): VITAMINB12, FOLATE, FERRITIN, TIBC, IRON, RETICCTPCT in the last 72 hours. Sepsis Labs: No results for input(s): PROCALCITON, LATICACIDVEN in the last 168 hours.  No results found for this or any previous visit (from the past 240 hour(s)).       Radiology Studies: No results found.      Scheduled Meds: . apixaban  5 mg Oral BID  . aspirin  81 mg Oral Daily  . carvedilol  18.75 mg Oral BID WC  . sodium chloride flush  3 mL Intravenous Q12H  . torsemide  60 mg Oral Daily   Continuous Infusions: . sodium chloride       LOS: 4 days    Time spent: 30 minutes    Kathie Dike, MD [facility] Pager [phone]  If 7PM-7AM, please contact night-coverage [email or web address] Password [email or web address] 09/06/2017, 5:12 PM

## 2017-09-06 NOTE — Progress Notes (Signed)
Subjective: Interval History: has no complaint of nausea or vomiting. Breathing is better.  Objective: Vital signs in last 24 hours: Temp:  [97.7 F (36.5 C)-98.5 F (36.9 C)] 97.7 F (36.5 C) (07/06 0626) Pulse Rate:  [78-162] 78 (07/06 0626) Resp:  [15-19] 19 (07/06 0626) BP: (81-96)/(59-68) 87/60 (07/06 0626) SpO2:  [93 %-100 %] 100 % (07/06 0626) Weight:  [108.1 kg (238 lb 5.1 oz)] 108.1 kg (238 lb 5.1 oz) (07/06 0626) Weight change: 1.5 kg (3 lb 4.9 oz)  Intake/Output from previous day: 07/05 0701 - 07/06 0700 In: 240 [P.O.:240] Out: 650 [Urine:650] Intake/Output this shift: No intake/output data recorded.  General appearance: alert, cooperative and no distress Resp: diminished breath sounds bilaterally Cardio: regular rate and rhythm Extremities: edema 1 t0 2+ edema  Lab Results: Recent Labs    09/06/17 0606  WBC 7.4  HGB 10.7*  HCT 36.5  PLT 156   BMET:  Recent Labs    09/05/17 0425 09/06/17 0606  NA 140 141  K 4.8 4.0  CL 105 103  CO2 23 26  GLUCOSE 121* 98  BUN 95* 96*  CREATININE 2.80* 2.51*  CALCIUM 8.7* 8.4*   No results for input(s): PTH in the last 72 hours. Iron Studies: No results for input(s): IRON, TIBC, TRANSFERRIN, FERRITIN in the last 72 hours.  Studies/Results: No results found.  I have reviewed the patient's current medications.  Assessment/Plan: 1: Difficulty breathing: Most likely from fluid overload.  Presently she is on Lasix feeling better.  Patient however still seems to have some swelling. 2] chronic renal failure: Possibly might have an acute component.  Her creatinine is slightly better. 3] hypertension her blood pressure is reasonably controlled 4] history of atrial fibrillation 5] anemia: Her hemoglobin is within our target goal 6] bone and mineral disorder: Her calcium and phosphorus is range. Plan: 1] DC IV Lasix 2] we will start her on Demadex 60 mg p.o. once a day 3] patient advised to cut down her salt and  fluid intake 4] we will check a renal panel in the morning.    LOS: 4 days   Pooja Camuso S 09/06/2017,8:55 AM

## 2017-09-07 LAB — RENAL FUNCTION PANEL
Albumin: 2.9 g/dL — ABNORMAL LOW (ref 3.5–5.0)
Anion gap: 11 (ref 5–15)
BUN: 94 mg/dL — ABNORMAL HIGH (ref 8–23)
CO2: 26 mmol/L (ref 22–32)
Calcium: 8.4 mg/dL — ABNORMAL LOW (ref 8.9–10.3)
Chloride: 106 mmol/L (ref 98–111)
Creatinine, Ser: 2.25 mg/dL — ABNORMAL HIGH (ref 0.44–1.00)
GFR calc Af Amer: 23 mL/min — ABNORMAL LOW (ref >60)
GFR calc non Af Amer: 20 mL/min — ABNORMAL LOW (ref >60)
Glucose, Bld: 95 mg/dL (ref 70–99)
Phosphorus: 4.6 mg/dL (ref 2.5–4.6)
Potassium: 3.7 mmol/L (ref 3.5–5.1)
Sodium: 143 mmol/L (ref 135–145)

## 2017-09-07 MED ORDER — CARVEDILOL 12.5 MG PO TABS
12.5000 mg | ORAL_TABLET | Freq: Two times a day (BID) | ORAL | Status: DC
  Administered 2017-09-07: 12.5 mg via ORAL
  Filled 2017-09-07 (×2): qty 1

## 2017-09-07 NOTE — Progress Notes (Signed)
Subjective: Interval History: Patient offers no complaints.  She denies any difficulty breathing.  Overall feeling better.  Objective: Vital signs in last 24 hours: Temp:  [97.6 F (36.4 C)-99.7 F (37.6 C)] 97.6 F (36.4 C) (07/07 0542) Pulse Rate:  [51-108] 108 (07/07 0542) Resp:  [16-20] 16 (07/07 0542) BP: (89-108)/(58-75) 108/74 (07/07 0542) SpO2:  [96 %-100 %] 100 % (07/07 0542) Weight:  [108.2 kg (238 lb 8.6 oz)] 108.2 kg (238 lb 8.6 oz) (07/07 0500) Weight change: 0.1 kg (3.5 oz)  Intake/Output from previous day: 07/06 0701 - 07/07 0700 In: 720 [P.O.:720] Out: 1800 [Urine:1800] Intake/Output this shift: No intake/output data recorded.  Generally patient is alert and in no apparent distress Chest: She has bibasilar crackles Heart exam revealed regular rate and rhythm no murmur Extremities she has 1+ edema  Lab Results: Recent Labs    09/06/17 0606  WBC 7.4  HGB 10.7*  HCT 36.5  PLT 156   BMET:  Recent Labs    09/06/17 0606 09/07/17 0633  NA 141 143  K 4.0 3.7  CL 103 106  CO2 26 26  GLUCOSE 98 95  BUN 96* 94*  CREATININE 2.51* 2.25*  CALCIUM 8.4* 8.4*   No results for input(s): PTH in the last 72 hours. Iron Studies: No results for input(s): IRON, TIBC, TRANSFERRIN, FERRITIN in the last 72 hours.  Studies/Results: No results found.  I have reviewed the patient's current medications.  Assessment/Plan: 1: Difficulty breathing:  improving.  Patient is on Demadex and she has 1800 cc of urine output.  Patient is still with edema.  But she denies any difficulty in breathing. 2] chronic renal failure: Possibly might have an acute component.  Her creatinine continue to improve.  Patient does not have any uremic signs and symptoms. 3] hypertension her blood pressure is reasonably controlled 4] history of atrial fibrillation: Her heart rate is controlled 5] anemia: Her hemoglobin is within our target goal 6] bone and mineral disorder: Her calcium and  phosphorus is range. Plan: 1] Continue with Demadex 2] we will check a renal panel in the morning.    LOS: 5 days   Kyndell Zeiser S 09/07/2017,9:19 AM

## 2017-09-07 NOTE — Progress Notes (Signed)
PROGRESS NOTE    Gina [name]  ZTI:[id number] DOB: 03-22-1940 DOA: 09/02/2017 PCP: Glenda Chroman, MD   Brief Narrative:   Gina [name] is a 77 y.o. female with medical history significant of systolic congestive heart failure with an EF of 20%, chronic kidney disease with a baseline creatinine of 1.6, A. fib, coronary artery disease, hypertension comes in with several days of progressive worsening swelling and shortness of breath.  Patient reports that swelling in her legs have been increasing for about 2 weeks and has become really worse over the last couple days prior to admission.  She was admitted with acute CHF exacerbation for which she has been placed on Lasix for diuresis.    Assessment & Plan:   Principal Problem:   CHF exacerbation ([place]) Active Problems:   Aortic regurgitation   Hypertension   Renal artery stenosis ([facility])   Atrial fibrillation ([facility])   AKI (acute kidney injury) ([place])   1. Acute on chronic systolic CHF decompensation-with ongoing volume overload.  Last ejection fraction of 20 to 25%.  Cardiology following that she had been diuresing with IV Lasix.  With diuresis, her creatinine began to trend up.  Nephrology has also been following.  She has been transitioned to oral Demadex today.  Urine output continues to remain fair.  Continue to monitor urine output.  She still has some evidence of volume overload.   2. New onset atrial fibrillation.  Heart rate has been trending up.  Carvedilol dose is being adjusted.  Management of heart rate challenging due to episodes of hypotension.  She is anticoagulated with Eliquis.  Plans are for outpatient direct current cardioversion once euvolemic.   3. Hypertension. Continue Coreg, but with holding parameters 4. AKI on CKD stage III.  Likely related to diuresis.  Mild improvement of creatinine today.  Nephrology following.  Current treatments 5. OSA.  Continue CPAP.   DVT prophylaxis: Eliquis Code Status: Full Family  Communication: None at bedside Disposition Plan: Plan will be to discharge home once adequately diuresed.    Consultants:   Cardiology  Nephrology  Procedures:   None  Antimicrobials:   None   Subjective: Continues to feel better.  Sitting up in chair today.  Feels that shortness of breath is improving.  Objective: Vitals:   09/06/17 2309 09/07/17 0500 09/07/17 0542 09/07/17 1305  BP:   108/74 105/80  Pulse: (!) 105  (!) 108 (!) 118  Resp: 16  16 20   Temp:   97.6 F (36.4 C) 98.9 F (37.2 C)  TempSrc:   Oral Oral  SpO2: 98%  100% 94%  Weight:  108.2 kg (238 lb 8.6 oz)    Height:        Intake/Output Summary (Last 24 hours) at 09/07/2017 1614 Last data filed at 09/07/2017 1200 Gross per 24 hour  Intake 720 ml  Output 1400 ml  Net -680 ml   Filed Weights   09/05/17 0615 09/06/17 0626 09/07/17 0500  Weight: 106.6 kg (235 lb 0.2 oz) 108.1 kg (238 lb 5.1 oz) 108.2 kg (238 lb 8.6 oz)    Examination:  General exam: Alert, awake, oriented x 3 Respiratory system: Crackles at bases. Respiratory effort normal. Cardiovascular system: Irregular. No murmurs, rubs, gallops. Gastrointestinal system: Abdomen is nondistended, soft and nontender. No organomegaly or masses felt. Normal bowel sounds heard. Central nervous system: Alert and oriented. No focal neurological deficits. Extremities: 1-2+ pitting edema bilaterally Skin: No rashes, lesions or ulcers Psychiatry: Judgement and insight appear  normal. Mood & affect appropriate.       Data Reviewed: I have personally reviewed following labs and imaging studies  CBC: Recent Labs  Lab 09/02/17 1512 09/06/17 0606  WBC 5.2 7.4  NEUTROABS 3.9  --   HGB 11.2* 10.7*  HCT 36.6 36.5  MCV 101.9* 104.6*  PLT 188 431   Basic Metabolic Panel: Recent Labs  Lab 09/03/17 0411 09/04/17 0612 09/05/17 0425 09/06/17 0606 09/07/17 0633  NA 142 143 140 141 143  K 4.2 4.2 4.8 4.0 3.7  CL 106 106 105 103 106  CO2 25 25 23  26 26   GLUCOSE 120* 130* 121* 98 95  BUN 88* 91* 95* 96* 94*  CREATININE 2.58* 2.63* 2.80* 2.51* 2.25*  CALCIUM 8.9 8.8* 8.7* 8.4* 8.4*  PHOS  --   --   --  5.0* 4.6   GFR: Estimated Creatinine Clearance: 25.6 mL/min (A) (by C-G formula based on SCr of 2.25 mg/dL (H)). Liver Function Tests: Recent Labs  Lab 09/06/17 0606 09/07/17 5400  ALBUMIN 3.1* 2.9*   No results for input(s): LIPASE, AMYLASE in the last 168 hours. No results for input(s): AMMONIA in the last 168 hours. Coagulation Profile: No results for input(s): INR, PROTIME in the last 168 hours. Cardiac Enzymes: Recent Labs  Lab 09/02/17 1512 09/02/17 1741 09/03/17 0411 09/03/17 0449 09/03/17 1044  TROPONINI 0.05* 0.05* 0.05* 0.04* 0.04*   BNP (last 3 results) No results for input(s): PROBNP in the last 8760 hours. HbA1C: No results for input(s): HGBA1C in the last 72 hours. CBG: No results for input(s): GLUCAP in the last 168 hours. Lipid Profile: No results for input(s): CHOL, HDL, LDLCALC, TRIG, CHOLHDL, LDLDIRECT in the last 72 hours. Thyroid Function Tests: No results for input(s): TSH, T4TOTAL, FREET4, T3FREE, THYROIDAB in the last 72 hours. Anemia Panel: No results for input(s): VITAMINB12, FOLATE, FERRITIN, TIBC, IRON, RETICCTPCT in the last 72 hours. Sepsis Labs: No results for input(s): PROCALCITON, LATICACIDVEN in the last 168 hours.  No results found for this or any previous visit (from the past 240 hour(s)).       Radiology Studies: No results found.      Scheduled Meds: . apixaban  5 mg Oral BID  . aspirin  81 mg Oral Daily  . carvedilol  12.5 mg Oral BID WC  . sodium chloride flush  3 mL Intravenous Q12H  . torsemide  60 mg Oral Daily   Continuous Infusions: . sodium chloride       LOS: 5 days    Time spent: 30 minutes    Kathie Dike, MD [facility] Pager [phone]  If 7PM-7AM, please contact night-coverage [email or web address] Password [email or web address] 09/07/2017, 4:14  PM

## 2017-09-08 DIAGNOSIS — I480 Paroxysmal atrial fibrillation: Secondary | ICD-10-CM

## 2017-09-08 DIAGNOSIS — N183 Chronic kidney disease, stage 3 unspecified: Secondary | ICD-10-CM

## 2017-09-08 DIAGNOSIS — I5023 Acute on chronic systolic (congestive) heart failure: Secondary | ICD-10-CM

## 2017-09-08 DIAGNOSIS — I519 Heart disease, unspecified: Secondary | ICD-10-CM

## 2017-09-08 LAB — RENAL FUNCTION PANEL
Albumin: 2.9 g/dL — ABNORMAL LOW (ref 3.5–5.0)
Anion gap: 12 (ref 5–15)
BUN: 94 mg/dL — ABNORMAL HIGH (ref 8–23)
CO2: 24 mmol/L (ref 22–32)
Calcium: 8.3 mg/dL — ABNORMAL LOW (ref 8.9–10.3)
Chloride: 106 mmol/L (ref 98–111)
Creatinine, Ser: 2.13 mg/dL — ABNORMAL HIGH (ref 0.44–1.00)
GFR calc Af Amer: 25 mL/min — ABNORMAL LOW (ref >60)
GFR calc non Af Amer: 21 mL/min — ABNORMAL LOW (ref >60)
Glucose, Bld: 106 mg/dL — ABNORMAL HIGH (ref 70–99)
Phosphorus: 4.8 mg/dL — ABNORMAL HIGH (ref 2.5–4.6)
Potassium: 3.7 mmol/L (ref 3.5–5.1)
Sodium: 142 mmol/L (ref 135–145)

## 2017-09-08 MED ORDER — BISACODYL 10 MG RE SUPP
10.0000 mg | Freq: Once | RECTAL | Status: AC
  Administered 2017-09-08: 10 mg via RECTAL
  Filled 2017-09-08: qty 1

## 2017-09-08 MED ORDER — METOPROLOL SUCCINATE ER 25 MG PO TB24
25.0000 mg | ORAL_TABLET | Freq: Two times a day (BID) | ORAL | Status: DC
  Administered 2017-09-08 (×2): 25 mg via ORAL
  Filled 2017-09-08 (×2): qty 1

## 2017-09-08 NOTE — Progress Notes (Signed)
Subjective: Interval History: Patient complains of lack of sleep last night.  Otherwise she feels okay.  She denies any difficulty breathing no orthopnea.  Objective: Vital signs in last 24 hours: Temp:  [97.6 F (36.4 C)-99.5 F (37.5 C)] 97.6 F (36.4 C) (07/08 0529) Pulse Rate:  [89-118] 89 (07/08 0529) Resp:  [16-21] 16 (07/08 0529) BP: (99-108)/(74-86) 108/86 (07/08 0529) SpO2:  [94 %-98 %] 98 % (07/08 0529) Weight:  [107.7 kg (237 lb 7 oz)] 107.7 kg (237 lb 7 oz) (07/08 0529) Weight change: -0.5 kg (-1 lb 1.6 oz)  Intake/Output from previous day: 07/07 0701 - 07/08 0700 In: 720 [P.O.:720] Out: 1100 [Urine:1100] Intake/Output this shift: No intake/output data recorded.  Generally patient is alert and in no apparent distress Chest: She has bibasilar crackles Heart exam revealed regular rate and rhythm no murmur Extremities she has 1+ edema  Lab Results: Recent Labs    09/06/17 0606  WBC 7.4  HGB 10.7*  HCT 36.5  PLT 156   BMET:  Recent Labs    09/07/17 0633 09/08/17 0529  NA 143 142  K 3.7 3.7  CL 106 106  CO2 26 24  GLUCOSE 95 106*  BUN 94* 94*  CREATININE 2.25* 2.13*  CALCIUM 8.4* 8.3*   No results for input(s): PTH in the last 72 hours. Iron Studies: No results for input(s): IRON, TIBC, TRANSFERRIN, FERRITIN in the last 72 hours.  Studies/Results: No results found.  I have reviewed the patient's current medications.  Assessment/Plan: 1: Difficulty breathing: Patient remains on Demadex.  She has 1100 cc of urine output.  Patient is still has edema but denies any difficulty breathing.. 2] chronic renal failure: Possibly might have an acute component.  Her creatinine continue to improve.  Patient is a symptomatic. 3] hypertension her blood pressure is reasonably controlled 4] history of atrial fibrillation: Her heart rate is controlled 5] anemia: Her hemoglobin is within our target goal 6] bone and mineral disorder: Her calcium and phosphorus is  range. Plan: 1] Continue with Demadex 2] we will check a renal panel in the morning. 3] patient advised to cut down her salt and fluid intake 4] I will see her in 3 weeks as an outpatient when she is discharged.    LOS: 6 days   Quantay Zaremba S 09/08/2017,7:43 AM

## 2017-09-08 NOTE — Progress Notes (Signed)
Progress Note  Patient Name: Gina [name] Date of Encounter: 09/08/2017  Primary Cardiologist: Carlyle Dolly, MD   Subjective   I last saw the patient on Friday, July 5.  She had been a recent well with oral Demadex.  There had been some issues with heart rate control with respect to atrial fibrillation and carvedilol dose was increased to 12.5 mg twice daily.  It was held this morning due to systolic blood pressure in the 90 range.  I spoke with Dr. Manuella Ghazi about her.  She had some palpitations earlier and heart rate was in the 110 bpm range.  She denies chest pain.  She has some bilateral leg edema which does not appear to have changed considerably since last Friday.  She has had difficulty getting up and walking due to a "catch in her back ".  She has happy that torsemide is working well.  She is eager to go home.   Inpatient Medications    Scheduled Meds: . apixaban  5 mg Oral BID  . aspirin  81 mg Oral Daily  . carvedilol  12.5 mg Oral BID WC  . sodium chloride flush  3 mL Intravenous Q12H  . torsemide  60 mg Oral Daily   Continuous Infusions: . sodium chloride     PRN Meds: sodium chloride, acetaminophen, bisacodyl, ondansetron (ZOFRAN) IV, sodium chloride flush   Vital Signs    Vitals:   09/07/17 2221 09/07/17 2240 09/08/17 0529 09/08/17 0855  BP: 99/74  108/86 98/64  Pulse: 93 (!) 116 89 (!) 120  Resp: (!) 21 20 16  (!) 22  Temp: 99.5 F (37.5 C)  97.6 F (36.4 C)   TempSrc: Oral  Oral   SpO2: 95% 98% 98% 94%  Weight:   237 lb 7 oz (107.7 kg)   Height:        Intake/Output Summary (Last 24 hours) at 09/08/2017 1138 Last data filed at 09/08/2017 0500 Gross per 24 hour  Intake 480 ml  Output 1100 ml  Net -620 ml   Filed Weights   09/06/17 0626 09/07/17 0500 09/08/17 0529  Weight: 238 lb 5.1 oz (108.1 kg) 238 lb 8.6 oz (108.2 kg) 237 lb 7 oz (107.7 kg)    Telemetry    Atrial fibrillation, heart rates 90-110- Personally Reviewed  ECG    No new  tracings- Personally Reviewed  Physical Exam   GEN: No acute distress.   Neck: No JVD Cardiac:  Heart rate at upper normal limits, irregular rhythm, no murmurs, rubs, or gallops.  Respiratory: Clear to auscultation bilaterally. GI: Soft, nontender, non-distended  MS:  2+ pitting bilateral lower extremity edema; No deformity. Neuro:  Nonfocal  Psych: Normal affect   Labs    Chemistry Recent Labs  Lab 09/06/17 0606 09/07/17 0633 09/08/17 0529  NA 141 143 142  K 4.0 3.7 3.7  CL 103 106 106  CO2 26 26 24   GLUCOSE 98 95 106*  BUN 96* 94* 94*  CREATININE 2.51* 2.25* 2.13*  CALCIUM 8.4* 8.4* 8.3*  ALBUMIN 3.1* 2.9* 2.9*  GFRNONAA 18* 20* 21*  GFRAA 20* 23* 25*  ANIONGAP 12 11 12      Hematology Recent Labs  Lab 09/02/17 1512 09/06/17 0606  WBC 5.2 7.4  RBC 3.59* 3.49*  HGB 11.2* 10.7*  HCT 36.6 36.5  MCV 101.9* 104.6*  MCH 31.2 30.7  MCHC 30.6 29.3*  RDW 20.9* 21.4*  PLT 188 156    Cardiac Enzymes Recent Labs  Lab 09/02/17  1741 09/03/17 0411 09/03/17 0449 09/03/17 1044  TROPONINI 0.05* 0.05* 0.04* 0.04*   No results for input(s): TROPIPOC in the last 168 hours.   BNP Recent Labs  Lab 09/02/17 1512  BNP >4,500.0*     DDimer No results for input(s): DDIMER in the last 168 hours.   Radiology    No results found.  Cardiac Studies   No new studies this hospitalization, previous cardiac studies reviewed in my notes earlier.  Patient Profile     77 y.o. female with chronic systolic heart failure and new onset atrial fibrillation and chronic left bundle branch block who has been hospitalized for acute on chronic systolic heart failure.  Assessment & Plan    1. Acute on chronic systolic heart failure, LVEF 20 to 25%:She is diuresing well on torsemide 60 mg daily.  She has put out roughly nearly 2 L in the last 24+ hours.  She is symptomatically improved but continues to have lower extremity edema.  I suspect this will improve when she is up and  walking. If she remains in atrial fibrillation, I would likely plan for an outpatient direct-current cardioversion when she is euvolemic. Shehas agreed to an ICD. She would also benefit from cardiac resynchronization therapy given left bundle branch block.  2. New onset atrial fibrillation:Carvedilol was held this morning due to systolic blood pressures in the 90 range.  Heart rates ranged from 90-110 bpm.  I will switch carvedilol to Toprol-XL 25 mg twice daily as this will have less of an effect on blood pressure.  She is systemically anticoagulated with Eliquis 5 mg twice daily.If she remains in atrial fibrillation, I would plan for an outpatient direct-current cardioversion once she is euvolemic.  3. Obstructive sleep apnea: Continue CPAP.   4. Hypertension:Blood pressure is low normal.  I will switch carvedilol to Toprol-XL.  5. Left bundle branch block: This is chronic. QRS duration is 214 ms. Given severely reduced left ventricular systolic function, she would benefit from cardiac recentralization therapy. We discussed this and she has agreed to proceed with this in the outpatient setting.  6. Chronic kidney disease stage III: Nephrology following.  BUN 94 and creatinine 2.13.  It was 2.8 when I last evaluated her on 7/5.   For questions or updates, please contact [place] Please consult [email or web address] for contact info under Cardiology/STEMI.      Signed, Kate Sable, MD  09/08/2017, 11:38 AM

## 2017-09-08 NOTE — Progress Notes (Signed)
PROGRESS NOTE    [name]  FHL:[id number] DOB: November 01, 1940 DOA: 09/02/2017 PCP: Glenda Chroman, MD   Brief Narrative:   Gina [name] is a 77 y.o. female with medical history significant of systolic congestive heart failure with an EF of 20%, chronic kidney disease with a baseline creatinine of 1.6, A. fib, coronary artery disease, hypertension comes in with several days of progressive worsening swelling and shortness of breath.  Patient reports that swelling in her legs have been increasing for about 2 weeks and has become really worse over the last couple days prior to admission.  She was admitted with acute CHF exacerbation for which she has been placed on Lasix for diuresis.    Assessment & Plan:   Principal Problem:   CHF exacerbation ([place]) Active Problems:   Aortic regurgitation   Hypertension   Renal artery stenosis (HCC)   Atrial fibrillation (H[facility])   AKI (acute kidney injury) ([place])   1. Acute on chronic systolic CHF decompensation-with ongoing volume overload.  Last ejection fraction of 20 to 25%.  Cardiology following that she had been diuresing with IV Lasix.  With diuresis, her creatinine began to trend up.  Nephrology has also been following.  She has been transitioned to oral Demadex on 7/7.  Urine output continues to remain fair.  Continue to monitor urine output.  She still has some evidence of volume overload.  She continues to not feel close to her baseline and has some dyspnea. Continue on current Demadex as she appears to be responding well to this from a volume standpoint. 2. New onset atrial fibrillation.  Heart rate improved with Carvedilol dose adjusted up.  Management of heart rate challenging due to episodes of hypotension.  She is anticoagulated with Eliquis.  Plans are for outpatient direct current cardioversion once euvolemic.   3. Hypertension. Continue Coreg, but with holding parameters especially in evenings. 4. AKI on CKD stage III.  Likely related to  diuresis.  Mild improvement of creatinine today.  Nephrology following.  Current treatments 5. OSA.  Continue CPAP. 6. Constipation. Try suppository as requested and may require enema.   DVT prophylaxis: Eliquis Code Status: Full Family Communication: None at bedside Disposition Plan: Plan will be to discharge home once adequately diuresed. Hopefully in 24 hours.   Consultants:   Cardiology  Nephrology  Procedures:   None  Antimicrobials:   None   Subjective: Patient seen and evaluated this morning.  She is complaining of ongoing shortness of breath as well as some mild palpitations that are bothering her this morning.  She has not had a bowel movement for almost 1 week and has some abdominal discomfort as well.  Objective: Vitals:   09/07/17 1305 09/07/17 2221 09/07/17 2240 09/08/17 0529  BP: 105/80 99/74  108/86  Pulse: (!) 118 93 (!) 116 89  Resp: 20 (!) 21 20 16   Temp: 98.9 F (37.2 C) 99.5 F (37.5 C)  97.6 F (36.4 C)  TempSrc: Oral Oral  Oral  SpO2: 94% 95% 98% 98%  Weight:    107.7 kg (237 lb 7 oz)  Height:        Intake/Output Summary (Last 24 hours) at 09/08/2017 0819 Last data filed at 09/08/2017 0500 Gross per 24 hour  Intake 480 ml  Output 1100 ml  Net -620 ml   Filed Weights   09/06/17 0626 09/07/17 0500 09/08/17 0529  Weight: 108.1 kg (238 lb 5.1 oz) 108.2 kg (238 lb 8.6 oz) 107.7 kg (237  lb 7 oz)    Examination:  General exam: Alert, awake, oriented x 3 Respiratory system: Crackles at bases. Respiratory effort normal. On RA. Cardiovascular system: Irregular. No murmurs, rubs, gallops. Gastrointestinal system: Abdomen is nondistended, soft and nontender. No organomegaly or masses felt. Normal bowel sounds heard. Central nervous system: Alert and oriented. No focal neurological deficits. Extremities: 1-2+ pitting edema bilaterally Skin: No rashes, lesions or ulcers Psychiatry: Judgement and insight appear normal. Mood & affect appropriate.         Data Reviewed: I have personally reviewed following labs and imaging studies  CBC: Recent Labs  Lab 09/02/17 1512 09/06/17 0606  WBC 5.2 7.4  NEUTROABS 3.9  --   HGB 11.2* 10.7*  HCT 36.6 36.5  MCV 101.9* 104.6*  PLT 188 222   Basic Metabolic Panel: Recent Labs  Lab 09/04/17 0612 09/05/17 0425 09/06/17 0606 09/07/17 0633 09/08/17 0529  NA 143 140 141 143 142  K 4.2 4.8 4.0 3.7 3.7  CL 106 105 103 106 106  CO2 25 23 26 26 24   GLUCOSE 130* 121* 98 95 106*  BUN 91* 95* 96* 94* 94*  CREATININE 2.63* 2.80* 2.51* 2.25* 2.13*  CALCIUM 8.8* 8.7* 8.4* 8.4* 8.3*  PHOS  --   --  5.0* 4.6 4.8*   GFR: Estimated Creatinine Clearance: 26.9 mL/min (A) (by C-G formula based on SCr of 2.13 mg/dL (H)). Liver Function Tests: Recent Labs  Lab 09/06/17 0606 09/07/17 0633 09/08/17 0529  ALBUMIN 3.1* 2.9* 2.9*   No results for input(s): LIPASE, AMYLASE in the last 168 hours. No results for input(s): AMMONIA in the last 168 hours. Coagulation Profile: No results for input(s): INR, PROTIME in the last 168 hours. Cardiac Enzymes: Recent Labs  Lab 09/02/17 1512 09/02/17 1741 09/03/17 0411 09/03/17 0449 09/03/17 1044  TROPONINI 0.05* 0.05* 0.05* 0.04* 0.04*   BNP (last 3 results) No results for input(s): PROBNP in the last 8760 hours. HbA1C: No results for input(s): HGBA1C in the last 72 hours. CBG: No results for input(s): GLUCAP in the last 168 hours. Lipid Profile: No results for input(s): CHOL, HDL, LDLCALC, TRIG, CHOLHDL, LDLDIRECT in the last 72 hours. Thyroid Function Tests: No results for input(s): TSH, T4TOTAL, FREET4, T3FREE, THYROIDAB in the last 72 hours. Anemia Panel: No results for input(s): VITAMINB12, FOLATE, FERRITIN, TIBC, IRON, RETICCTPCT in the last 72 hours. Sepsis Labs: No results for input(s): PROCALCITON, LATICACIDVEN in the last 168 hours.  No results found for this or any previous visit (from the past 240 hour(s)).       Radiology  Studies: No results found.      Scheduled Meds: . apixaban  5 mg Oral BID  . aspirin  81 mg Oral Daily  . carvedilol  12.5 mg Oral BID WC  . sodium chloride flush  3 mL Intravenous Q12H  . torsemide  60 mg Oral Daily   Continuous Infusions: . sodium chloride       LOS: 6 days    Time spent: 30 minutes    Jacquise Rarick Darleen Crocker, MD [facility] Pager [phone]  If 7PM-7AM, please contact night-coverage [email or web address] Password [email or web address] 09/08/2017, 8:19 AM

## 2017-09-08 NOTE — Progress Notes (Signed)
Coreg dose held this morning per parameters (SBP <100).

## 2017-09-09 ENCOUNTER — Inpatient Hospital Stay (HOSPITAL_COMMUNITY): Payer: Medicare Other

## 2017-09-09 DIAGNOSIS — I340 Nonrheumatic mitral (valve) insufficiency: Secondary | ICD-10-CM

## 2017-09-09 LAB — ECHOCARDIOGRAM COMPLETE
Height: 64 in
Weight: 3753.11 oz

## 2017-09-09 LAB — RENAL FUNCTION PANEL
Albumin: 3 g/dL — ABNORMAL LOW (ref 3.5–5.0)
Anion gap: 11 (ref 5–15)
BUN: 101 mg/dL — ABNORMAL HIGH (ref 8–23)
CO2: 24 mmol/L (ref 22–32)
Calcium: 8.5 mg/dL — ABNORMAL LOW (ref 8.9–10.3)
Chloride: 105 mmol/L (ref 98–111)
Creatinine, Ser: 2.19 mg/dL — ABNORMAL HIGH (ref 0.44–1.00)
GFR calc Af Amer: 24 mL/min — ABNORMAL LOW (ref >60)
GFR calc non Af Amer: 21 mL/min — ABNORMAL LOW (ref >60)
Glucose, Bld: 113 mg/dL — ABNORMAL HIGH (ref 70–99)
Phosphorus: 5.1 mg/dL — ABNORMAL HIGH (ref 2.5–4.6)
Potassium: 4.2 mmol/L (ref 3.5–5.1)
Sodium: 140 mmol/L (ref 135–145)

## 2017-09-09 LAB — COOXEMETRY PANEL
Carboxyhemoglobin: 1.6 % — ABNORMAL HIGH (ref 0.5–1.5)
Methemoglobin: 0.7 % (ref 0.0–1.5)
O2 Saturation: 43.6 %
Total hemoglobin: 11.7 g/dL — ABNORMAL LOW (ref 12.0–16.0)
Total oxygen content: 43.5 mL/dL — ABNORMAL HIGH (ref 15.0–23.0)

## 2017-09-09 LAB — MRSA PCR SCREENING: MRSA by PCR: NEGATIVE

## 2017-09-09 MED ORDER — SODIUM CHLORIDE 0.9% FLUSH
10.0000 mL | Freq: Two times a day (BID) | INTRAVENOUS | Status: DC
  Administered 2017-09-09: 10 mL
  Administered 2017-09-10: 20 mL
  Administered 2017-09-10 – 2017-09-16 (×6): 10 mL
  Administered 2017-09-17: 20 mL
  Administered 2017-09-19 – 2017-09-21 (×4): 10 mL
  Administered 2017-09-22: 20 mL
  Administered 2017-09-23: 10 mL

## 2017-09-09 MED ORDER — CHLORHEXIDINE GLUCONATE CLOTH 2 % EX PADS
6.0000 | MEDICATED_PAD | Freq: Every day | CUTANEOUS | Status: DC
  Administered 2017-09-10 – 2017-09-23 (×10): 6 via TOPICAL

## 2017-09-09 MED ORDER — METOPROLOL SUCCINATE ER 25 MG PO TB24
37.5000 mg | ORAL_TABLET | Freq: Two times a day (BID) | ORAL | Status: DC
  Administered 2017-09-09 (×2): 37.5 mg via ORAL
  Filled 2017-09-09 (×2): qty 2

## 2017-09-09 MED ORDER — TRAMADOL HCL 50 MG PO TABS
50.0000 mg | ORAL_TABLET | Freq: Two times a day (BID) | ORAL | Status: DC | PRN
  Administered 2017-09-09 – 2017-09-22 (×11): 50 mg via ORAL
  Filled 2017-09-09 (×11): qty 1

## 2017-09-09 MED ORDER — SODIUM CHLORIDE 0.9% FLUSH
10.0000 mL | INTRAVENOUS | Status: DC | PRN
  Administered 2017-09-15: 10 mL
  Filled 2017-09-09: qty 40

## 2017-09-09 MED ORDER — AMIODARONE HCL IN DEXTROSE 360-4.14 MG/200ML-% IV SOLN
60.0000 mg/h | INTRAVENOUS | Status: AC
  Administered 2017-09-09: 60 mg/h via INTRAVENOUS
  Filled 2017-09-09 (×2): qty 200

## 2017-09-09 MED ORDER — AMIODARONE HCL IN DEXTROSE 360-4.14 MG/200ML-% IV SOLN
30.0000 mg/h | INTRAVENOUS | Status: DC
  Administered 2017-09-10 – 2017-09-11 (×5): 30 mg/h via INTRAVENOUS
  Filled 2017-09-09 (×6): qty 200

## 2017-09-09 MED ORDER — AMIODARONE LOAD VIA INFUSION
150.0000 mg | Freq: Once | INTRAVENOUS | Status: AC
  Administered 2017-09-09: 150 mg via INTRAVENOUS
  Filled 2017-09-09: qty 83.34

## 2017-09-09 NOTE — Progress Notes (Addendum)
Progress Note  Patient Name: Gina [name] Date of Encounter: 09/09/2017  Primary Cardiologist: Carlyle Dolly, MD   Subjective   Reportrs SOB is improving.   Inpatient Medications    Scheduled Meds: . apixaban  5 mg Oral BID  . aspirin  81 mg Oral Daily  . metoprolol succinate  25 mg Oral BID  . sodium chloride flush  3 mL Intravenous Q12H  . torsemide  60 mg Oral Daily   Continuous Infusions: . sodium chloride     PRN Meds: sodium chloride, acetaminophen, bisacodyl, ondansetron (ZOFRAN) IV, sodium chloride flush   Vital Signs    Vitals:   09/08/17 2032 09/08/17 2113 09/08/17 2225 09/09/17 0513  BP:  92/81 113/82 102/77  Pulse:  96 (!) 118 (!) 113  Resp:      Temp:  98.5 F (36.9 C)  98 F (36.7 C)  TempSrc:  Oral  Oral  SpO2: 91% 99%  97%  Weight:    238 lb 5.1 oz (108.1 kg)  Height:        Intake/Output Summary (Last 24 hours) at 09/09/2017 0758 Last data filed at 09/09/2017 0544 Gross per 24 hour  Intake 820 ml  Output 900 ml  Net -80 ml   Filed Weights   09/07/17 0500 09/08/17 0529 09/09/17 0513  Weight: 238 lb 8.6 oz (108.2 kg) 237 lb 7 oz (107.7 kg) 238 lb 5.1 oz (108.1 kg)    Telemetry    afib 100-120  ECG    na  Physical Exam   GEN: No acute distress.   Neck: JVD not visualized Cardiac: irreg, 2/6 systolic at apex, no, rubs, or gallops.  Respiratory: Clear to auscultation bilaterally. GI: Soft, nontender, non-distended  MS: 2+ bilateral LE edema; No deformity. Neuro:  Nonfocal  Psych: Normal affect   Labs    Chemistry Recent Labs  Lab 09/07/17 0633 09/08/17 0529 09/09/17 0616  NA 143 142 140  K 3.7 3.7 4.2  CL 106 106 105  CO2 26 24 24   GLUCOSE 95 106* 113*  BUN 94* 94* 101*  CREATININE 2.25* 2.13* 2.19*  CALCIUM 8.4* 8.3* 8.5*  ALBUMIN 2.9* 2.9* 3.0*  GFRNONAA 20* 21* 21*  GFRAA 23* 25* 24*  ANIONGAP 11 12 11      Hematology Recent Labs  Lab 09/02/17 1512 09/06/17 0606  WBC 5.2 7.4  RBC 3.59* 3.49*  HGB  11.2* 10.7*  HCT 36.6 36.5  MCV 101.9* 104.6*  MCH 31.2 30.7  MCHC 30.6 29.3*  RDW 20.9* 21.4*  PLT 188 156    Cardiac Enzymes Recent Labs  Lab 09/02/17 1741 09/03/17 0411 09/03/17 0449 09/03/17 1044  TROPONINI 0.05* 0.05* 0.04* 0.04*   No results for input(s): TROPIPOC in the last 168 hours.   BNP Recent Labs  Lab 09/02/17 1512  BNP >4,500.0*     DDimer No results for input(s): DDIMER in the last 168 hours.   Radiology    No results found.  Cardiac Studies    Patient Profile     77 y.o. female with history of chronic systolic HF and afib admitted with acute on chronic systolic HF.   Assessment & Plan    1. Acute on chronic systolic HF - 07/6810 [place] echo LVEF 20-25%, mod RV dysfunction, mild to mod MR,mild to mod TR - I/Os only report net negative 1.3 L this admission. Diuresis has been limited by soft bp's. - she is on torsemide 60mg  daily. Cr had been 1.6 in 06/2017, up  to 2-2.2 this admit. Reported weights 238 lbs and stable over the last few days. Reported weights in late June 225, in April 205 lbs.  - previously refused ICD, now open to. Would consider CRT-D given her LBBB - medical therapy with Toprol 25mg  bid. No ACE/ARB/ARNI due to poor renal function.   - data would suggest she really has not diuresed significantly. We will obtain a standing weight to clarify weights and repeat CXR. Concern progressing renal dysfunction and remains volume overloaded. If trend continues may need to consider trial of inotropes.  - repeat echo to clarify function, last study at [facility].  2. Afib - changed from coreg to Toprol due to soft bp's. Rates 100s to 110s - she is on eliquis. Started on therapeutic eliquis dose 08/15/17.  - stop ASA on eliquis  - increase Toprol to 37.5mg  bid. She has been on anticoag x 3 weeks, consider DCCV once respiratory status improved. - make npo tonight in case DCCV tomorrow   3. CKD IV - followed by nephrology      For questions or  updates, please contact [place] Please consult [email or web address] for contact info under Cardiology/STEMI.      Merrily Pew, MD  09/09/2017, 7:58 AM

## 2017-09-09 NOTE — Progress Notes (Signed)
Physical Therapy Treatment Patient Details Name: Gina [name] MRN: [id number] DOB: Nov 06, 1940 Today's Date: 09/09/2017    History of Present Illness  Gina [name] is a 77 y.o. female with medical history significant of systolic congestive heart failure with an EF of 20%, chronic kidney disease with a baseline creatinine of 1.6, A. fib, coronary artery disease, hypertension comes in with several days of progressive worsening swelling and shortness of breath.  Patient reports his swelling her legs have been increasing for about 2 weeks really worse the last couple days.  She was recently hospitalized in her diuretics were held secondary to acute kidney injury.  She reports she is has PND now but no orthopnea.  Her breathing is gotten worse.  She is not requiring home oxygen.  She denies any cough or fevers.  She denies any nausea vomiting or diarrhea.  She denies any chest pain.  Patient be referred for admission for's congestive heart failure in the setting of worsening renal failure.    PT Comments    Patient requires much time and frequent rest breaks for sitting up at bedside due to low back pain (LBP) and c/o SOB.  Patient has difficulty leaning forward and attempting BLE exercises while seated at bedside due to LBP, required support at shoulders to maintain sitting balance, unable to stand due to weakness, SOB and fear of increasing LBP.  Patient able to use BUE/BLE to help pull self up in bed with bed in head down position when put back to bed.  Patient will benefit from continued physical therapy in hospital and recommended venue below to increase strength, balance, endurance for safe ADLs and gait.   Follow Up Recommendations  SNF;Supervision/Assistance - 24 hour     Equipment Recommendations  None recommended by PT    Recommendations for Other Services       Precautions / Restrictions Precautions Precautions: Fall Restrictions Weight Bearing Restrictions: No    Mobility  Bed  Mobility Overal bed mobility: Needs Assistance Bed Mobility: Supine to Sit;Sit to Supine     Supine to sit: Mod assist Sit to supine: Mod assist   General bed mobility comments: difficulty moving due to low back pain  Transfers                    Ambulation/Gait                 Stairs             Wheelchair Mobility    Modified Rankin (Stroke Patients Only)       Balance Overall balance assessment: Needs assistance Sitting-balance support: Feet supported;Bilateral upper extremity supported Sitting balance-Leahy Scale: Poor Sitting balance - Comments: fair/poor with frequent leaning backwards due to low back pain Postural control: Posterior lean                                  Cognition Arousal/Alertness: Awake/alert Behavior During Therapy: WFL for tasks assessed/performed Overall Cognitive Status: Within Functional Limits for tasks assessed                                        Exercises General Exercises - Lower Extremity Toe Raises: Seated;AROM;Strengthening;Both;5 reps Heel Raises: Seated;AROM;Strengthening;Both;5 reps    General Comments        Pertinent Vitals/Pain Pain  Assessment: 0-10 Pain Score: 6  Pain Location: low back Pain Descriptors / Indicators: Aching;Grimacing;Guarding Pain Intervention(s): Limited activity within patient's tolerance;Monitored during session;Premedicated before session    Home Living                      Prior Function            PT Goals (current goals can now be found in the care plan section) Acute Rehab PT Goals Patient Stated Goal: return home able to walk safely PT Goal Formulation: With patient Time For Goal Achievement: 09/23/17 Potential to Achieve Goals: Fair Progress towards PT goals: Not progressing toward goals - comment(due to severe low back pain)    Frequency    Min 3X/week      PT Plan Current plan remains appropriate     Co-evaluation              AM-PAC PT "6 Clicks" Daily Activity  Outcome Measure  Difficulty turning over in bed (including adjusting bedclothes, sheets and blankets)?: A Little Difficulty moving from lying on back to sitting on the side of the bed? : A Lot Difficulty sitting down on and standing up from a chair with arms (e.g., wheelchair, bedside commode, etc,.)?: Unable Help needed moving to and from a bed to chair (including a wheelchair)?: Total Help needed walking in hospital room?: Total Help needed climbing 3-5 steps with a railing? : Total 6 Click Score: 9    End of Session   Activity Tolerance: Patient limited by fatigue;Patient limited by pain Patient left: in bed;with call bell/phone within reach Nurse Communication: Mobility status PT Visit Diagnosis: Unsteadiness on feet (R26.81);Other abnormalities of gait and mobility (R26.89);Muscle weakness (generalized) (M62.81)     Time: 5681-2751 PT Time Calculation (min) (ACUTE ONLY): 32 min  Charges:  $Therapeutic Activity: 23-37 mins                    G Codes:       2:42 PM, 2017/09/24 Lonell Grandchild, MPT Physical Therapist with [facility] [phone] office [phone] mobile phone

## 2017-09-09 NOTE — Progress Notes (Signed)
Subjective: Interval History: Patient is feeling better. Denies any difficulty in breathing  Objective: Vital signs in last 24 hours: Temp:  [98 F (36.7 C)-98.5 F (36.9 C)] 98 F (36.7 C) (07/09 0513) Pulse Rate:  [96-120] 113 (07/09 0513) Resp:  [22] 22 (07/08 1327) BP: (86-113)/(64-84) 102/77 (07/09 0513) SpO2:  [91 %-99 %] 97 % (07/09 0513) Weight:  [108.1 kg (238 lb 5.1 oz)] 108.1 kg (238 lb 5.1 oz) (07/09 0513) Weight change: 0.4 kg (14.1 oz)  Intake/Output from previous day: 07/08 0701 - 07/09 0700 In: 820 [P.O.:820] Out: 900 [Urine:900] Intake/Output this shift: No intake/output data recorded.  Generally patient is alert and in no apparent distress Chest: She has bibasilar crackles Heart exam revealed regular rate and rhythm no murmur Extremities she has 1+ edema  Lab Results: No results for input(s): WBC, HGB, HCT, PLT in the last 72 hours. BMET:  Recent Labs    09/08/17 0529 09/09/17 0616  NA 142 140  K 3.7 4.2  CL 106 105  CO2 24 24  GLUCOSE 106* 113*  BUN 94* 101*  CREATININE 2.13* 2.19*  CALCIUM 8.3* 8.5*   No results for input(s): PTH in the last 72 hours. Iron Studies: No results for input(s): IRON, TIBC, TRANSFERRIN, FERRITIN in the last 72 hours.  Studies/Results: No results found.  I have reviewed the patient's current medications.  Assessment/Plan: 1: Difficulty breathing: Patient remains on Demadex.  She is nonoliguric.  Presently she is feeling better. 2] chronic renal failure: Possibly might have an acute component.  Renal function remains a stable.  Patient does not have any nausea or vomiting.   3] hypertension her blood pressure is reasonably controlled 4] history of atrial fibrillation: Her heart rate is controlled 5] anemia: Her hemoglobin is within our target goal 6] bone and mineral disorder: Her calcium and phosphorus is range. Plan: 1] Continue with Demadex 2]  we will check renal panel in the morning     LOS: 7 days    Ary Rudnick S 09/09/2017,7:57 AM

## 2017-09-09 NOTE — Progress Notes (Signed)
Report given to Ernie Hew, RN receiving nurse in [place]. Stated vascular access team alerted them that they would need order from nephrology to have PICC line placed. Notified Dr. Lowanda Foster who stated he would prefer central line be placed if possible and leave PICC line as last resort. Notified Dr. Manuella Ghazi. Order for central line given. Dr. Arnoldo Morale notified by nursing secretary. Patient transferred to [place] [facility] in stable condition. Patient states she already notified her husband and niece of transfer. Stepdown nursing staff to follow-up on central line or PICC line placement. Donavan Foil, RN

## 2017-09-09 NOTE — Progress Notes (Signed)
*  PRELIMINARY RESULTS* Echocardiogram 2D Echocardiogram has been performed.  Leavy Cella 09/09/2017, 2:16 PM

## 2017-09-09 NOTE — Progress Notes (Signed)
Patient with significant change in her heart failure since 08/2017. Had been stable over the last 2 years I have been following her without significant symptoms or hositalization. This is the 3rd admission for her in a month. In 08/2017 admitted to [facility] with UTI and AKI, diagnosed with afib at that time. Since that admission ongoing significant CHF symptoms. Her diuretics were held at that discharge, significant fluid gain that did not improve with restarting her prior home regimen. Fairly stable LVEF by echo today around 20%. One constant over the last month is her new diagnosis of afib, perhaps she is just not tolerating this rhythm at all even when rate controlled as she was at our last clinic appt, this admission has had elevated rates. Will try to get her into SR, dyspneic with just mild exertion, I worry about sedation for DCCV. Will plan amio gtt, continue anticoag.  Unable to get standing weight today due to imbalance. Bed rezeroed, weight 234 lbs. 30 lbs higher than her 06/2017 weight, 8 lbs higher than her 08/2017 clinic weight. CXR with ongoing edema. Of note BNP >4500 on admission.  Will discuss with primary team. Start IV amiodarone. Place PICC line to monitor CVP and co-ox, she may require inotropes to help support diuresis this admission, potential evalaution by CHF team.    Carlyle Dolly MD

## 2017-09-09 NOTE — Progress Notes (Signed)
Patient has order for a PICC line for CVP monitoring. Nephrology had stated that they prefer a central line rather than a PICC. [facility] is on the unit placing a line for another patient and suggested an IJ. Paged mid level on call and received the order for the IJ.  Spoke with Dr. Harl Bowie and informed him of the plan for the IJ and he put in the order for CVP monitoring. IJ will be placed by [facility].

## 2017-09-09 NOTE — Progress Notes (Signed)
Patient to transfer to [facility] unit. Patient aware of transfer this evening. Bed available and RN to call back for report. Orders for PICC line in place. Vascular access team notified of order. Received call from Zacarias Pontes IV team nurse, notified patiet needs PICC line placed for amiodarone drip. IV team nurse stated they would be able to place in the morning, so nursing is to call vascular access team. Vascular access team to place PICC line tonight and they should be here around 1930 or 2000 per representative when nursing called them. Text-paged Dr. Manuella Ghazi to notify. Donavan Foil, RN

## 2017-09-09 NOTE — Progress Notes (Signed)
Pharmacy consulted for Drug Drug interaction monitoring with Amiodarone.   Amiodarone and torsemide have a moderate severity drug interaction with fair documentation. Concurrent use of AMIODARONE and TORSEMIDE may result in increased plasma levels of torsemide.   Pharmacy to monitor along with physician.   Thomasenia Sales, PharmD, MBA, [place] Clinical Pharmacist

## 2017-09-09 NOTE — Progress Notes (Signed)
Attempted to help patient stand to obtain weight again this afternoon. Pt able to stand but was unable to stand on her own without staff holding on to her. Unable to obtain accurate weight on standup scale. Bed re-zeroed and weight obtained via bed. Donavan Foil, RN

## 2017-09-09 NOTE — Care Management Note (Signed)
Case Management Note  Patient Details  Name: Gina [name] MRN: [id number] Date of Birth: 1940-10-31  If discussed at Long Length of Stay Meetings, dates discussed:  09/09/2017 Additional Comments:  Anabelen Kaminsky, Chauncey Reading, RN 09/09/2017, 12:09 PM

## 2017-09-09 NOTE — Progress Notes (Addendum)
PROGRESS NOTE    Gina [name]  LSL:[id number] DOB: Aug 25, 1940 DOA: 09/02/2017 PCP: Glenda Chroman, MD   Brief Narrative:   Gina [name] is a 77 y.o. female with medical history significant of systolic congestive heart failure with an EF of 20%, chronic kidney disease with a baseline creatinine of 1.6, A. fib, coronary artery disease, hypertension comes in with several days of progressive worsening swelling and shortness of breath.  Patient reports that swelling in her legs have been increasing for about 2 weeks and has become really worse over the last couple days prior to admission.  She was admitted with acute CHF exacerbation for which she has been placed on Lasix for diuresis which has been transitioned to oral Demadex. Nephrology and Cardiology following.    Assessment & Plan:   Principal Problem:   CHF exacerbation ([place]) Active Problems:   Aortic regurgitation   Hypertension   Renal artery stenosis (HCC)   Atrial fibrillation (HCC)   AKI (acute kidney injury) ([place])   Acute on chronic systolic heart failure (HCC)   Severe left ventricular systolic dysfunction   Chronic kidney disease (CKD), stage III (moderate) ([place])   1. Acute on chronic systolic CHF decompensation-with ongoing volume overload.  Last ejection fraction of 20 to 25%.  Cardiology following and diuresing with IV Lasix which has now been transitioned to oral Demadex on 7/7.  With diuresis, her creatinine began to trend up.  Nephrology has also been following.  Urine output continues to remain fair. She still has evidence of volume overload, however.  She continues to not feel close to her baseline and has some dyspnea. Continue on current Demadex as she appears to be responding well to this from a volume standpoint. Cardiology to check repeat echo as well as CXR. Considering DC Cardioversion in am. 2. New onset atrial fibrillation.  Heart rate unimproved with change from Coreg to Toprol. Dose adjusted up today.  Management of heart rate challenging due to episodes of hypotension.  She is anticoagulated with Eliquis.  Plans are for possible direct current cardioversion in am as noted above. 3. Hypertension. Continue Toprol, but with holding parameters especially in evenings. 4. AKI on CKD stage III.  Likely related to diuresis.  Stable creatinine today.  Nephrology following.  Current treatments 5. OSA.  Continue CPAP. 6. Constipation. Resolved with suppository. 7. Low back pain. Started Tramadol today which has helped her significantly in the past.  Addendum: Called by cardiologist Dr. Harl Bowie regarding need for patient to transfer to stepdown unit and be initiated on amiodarone drip with PICC line placement for inotropic support and CVP monitoring.  DVT prophylaxis: Eliquis Code Status: Full Family Communication: None at bedside Disposition Plan: Plan will be to discharge home once adequately diuresed. Hopefully in 24-48 hours once HR controlled and asymptomatic.   Consultants:   Cardiology  Nephrology  Procedures:   None  Antimicrobials:   None   Subjective: Patient seen and evaluated this morning.  She is complaining of ongoing shortness of breath which appears to have improved some. She complains of pain to her back that is bothersome.  Objective: Vitals:   09/08/17 2225 09/09/17 0513 09/09/17 0848 09/09/17 1245  BP: 113/82 102/77    Pulse: (!) 118 (!) 113    Resp:      Temp:  98 F (36.7 C)    TempSrc:  Oral    SpO2:  97% 90%   Weight:  108.1 kg (238 lb 5.1 oz)  106.4  kg (234 lb 9.1 oz)  Height:        Intake/Output Summary (Last 24 hours) at 09/09/2017 1337 Last data filed at 09/09/2017 1000 Gross per 24 hour  Intake 583 ml  Output 1150 ml  Net -567 ml   Filed Weights   09/08/17 0529 09/09/17 0513 09/09/17 1245  Weight: 107.7 kg (237 lb 7 oz) 108.1 kg (238 lb 5.1 oz) 106.4 kg (234 lb 9.1 oz)    Examination:  General exam: Alert, awake, oriented x 3 Respiratory  system: Crackles at bases. Respiratory effort normal. On RA. Cardiovascular system: Irregular. No murmurs, rubs, gallops. Gastrointestinal system: Abdomen is nondistended, soft and nontender. No organomegaly or masses felt. Normal bowel sounds heard. Central nervous system: Alert and oriented. No focal neurological deficits. Extremities: 1-2+ pitting edema bilaterally Skin: No rashes, lesions or ulcers Psychiatry: Judgement and insight appear normal. Mood & affect appropriate.       Data Reviewed: I have personally reviewed following labs and imaging studies  CBC: Recent Labs  Lab 09/02/17 1512 09/06/17 0606  WBC 5.2 7.4  NEUTROABS 3.9  --   HGB 11.2* 10.7*  HCT 36.6 36.5  MCV 101.9* 104.6*  PLT 188 782   Basic Metabolic Panel: Recent Labs  Lab 09/05/17 0425 09/06/17 0606 09/07/17 0633 09/08/17 0529 09/09/17 0616  NA 140 141 143 142 140  K 4.8 4.0 3.7 3.7 4.2  CL 105 103 106 106 105  CO2 23 26 26 24 24   GLUCOSE 121* 98 95 106* 113*  BUN 95* 96* 94* 94* 101*  CREATININE 2.80* 2.51* 2.25* 2.13* 2.19*  CALCIUM 8.7* 8.4* 8.4* 8.3* 8.5*  PHOS  --  5.0* 4.6 4.8* 5.1*   GFR: Estimated Creatinine Clearance: 26 mL/min (A) (by C-G formula based on SCr of 2.19 mg/dL (H)). Liver Function Tests: Recent Labs  Lab 09/06/17 0606 09/07/17 9562 09/08/17 0529 09/09/17 0616  ALBUMIN 3.1* 2.9* 2.9* 3.0*   No results for input(s): LIPASE, AMYLASE in the last 168 hours. No results for input(s): AMMONIA in the last 168 hours. Coagulation Profile: No results for input(s): INR, PROTIME in the last 168 hours. Cardiac Enzymes: Recent Labs  Lab 09/02/17 1512 09/02/17 1741 09/03/17 0411 09/03/17 0449 09/03/17 1044  TROPONINI 0.05* 0.05* 0.05* 0.04* 0.04*   BNP (last 3 results) No results for input(s): PROBNP in the last 8760 hours. HbA1C: No results for input(s): HGBA1C in the last 72 hours. CBG: No results for input(s): GLUCAP in the last 168 hours. Lipid Profile: No  results for input(s): CHOL, HDL, LDLCALC, TRIG, CHOLHDL, LDLDIRECT in the last 72 hours. Thyroid Function Tests: No results for input(s): TSH, T4TOTAL, FREET4, T3FREE, THYROIDAB in the last 72 hours. Anemia Panel: No results for input(s): VITAMINB12, FOLATE, FERRITIN, TIBC, IRON, RETICCTPCT in the last 72 hours. Sepsis Labs: No results for input(s): PROCALCITON, LATICACIDVEN in the last 168 hours.  No results found for this or any previous visit (from the past 240 hour(s)).       Radiology Studies: Dg Chest Port 1 View  Result Date: 09/09/2017 CLINICAL DATA:  Shortness of breath EXAM: PORTABLE CHEST 1 VIEW COMPARISON:  Chest x-ray of 09/02/2017 FINDINGS: Again the lungs are poorly aerated. Moderate cardiomegaly is again noted and pericardial effusion cannot be excluded. There is mild pulmonary vascular congestion present. There are degenerative changes noted in both shoulders. IMPRESSION: 1. Poor inspiration. 2. Stable moderate cardiomegaly. Probable mild pulmonary vascular congestion. 3. Cannot exclude pericardial effusion. Electronically Signed   By: Eddie Dibbles  Alvester Chou M.D.   On: 09/09/2017 10:43        Scheduled Meds: . apixaban  5 mg Oral BID  . metoprolol succinate  37.5 mg Oral BID  . sodium chloride flush  3 mL Intravenous Q12H  . torsemide  60 mg Oral Daily   Continuous Infusions: . sodium chloride       LOS: 7 days    Time spent: 30 minutes    Erinn Mendosa Darleen Crocker, DO [facility] Pager [phone]  If 7PM-7AM, please contact night-coverage [email or web address] Password [email or web address] 09/09/2017, 1:37 PM

## 2017-09-09 NOTE — Progress Notes (Signed)
Attempted to assist patient to stand up to obtain weight via standup scale per Dr. Nelly Laurence request this am. Patient able to sit on side of bed after several attempts with two assist. Unable to stand due to weakness and c/o back pain. PT consult in place. Pt seen by PT this am who attempted to assist her up as well. Pt requested pain medication for back pain before continuing to try and stand. Tramadol given as ordered PRN pain. PT will see again once patient is able to cooperate with activity. Discussed with Dr. Harl Bowie while he was on unit. Donavan Foil, RN

## 2017-09-10 DIAGNOSIS — R570 Cardiogenic shock: Secondary | ICD-10-CM

## 2017-09-10 LAB — BASIC METABOLIC PANEL
Anion gap: 13 (ref 5–15)
BUN: 116 mg/dL — ABNORMAL HIGH (ref 8–23)
CO2: 24 mmol/L (ref 22–32)
Calcium: 8.8 mg/dL — ABNORMAL LOW (ref 8.9–10.3)
Chloride: 102 mmol/L (ref 98–111)
Creatinine, Ser: 2.6 mg/dL — ABNORMAL HIGH (ref 0.44–1.00)
GFR calc Af Amer: 19 mL/min — ABNORMAL LOW (ref >60)
GFR calc non Af Amer: 17 mL/min — ABNORMAL LOW (ref >60)
Glucose, Bld: 128 mg/dL — ABNORMAL HIGH (ref 70–99)
Potassium: 4.9 mmol/L (ref 3.5–5.1)
Sodium: 139 mmol/L (ref 135–145)

## 2017-09-10 LAB — COOXEMETRY PANEL
Carboxyhemoglobin: 1.4 % (ref 0.5–1.5)
Carboxyhemoglobin: 2.1 % — ABNORMAL HIGH (ref 0.5–1.5)
Methemoglobin: 0.6 % (ref 0.0–1.5)
Methemoglobin: 1 % (ref 0.0–1.5)
O2 Saturation: 41.4 %
O2 Saturation: 73.8 %
Total hemoglobin: 11.7 g/dL — ABNORMAL LOW (ref 12.0–16.0)
Total hemoglobin: 11 g/dL — ABNORMAL LOW (ref 12.0–16.0)

## 2017-09-10 LAB — HEPATIC FUNCTION PANEL
ALT: 178 U/L — ABNORMAL HIGH (ref 0–44)
AST: 150 U/L — ABNORMAL HIGH (ref 15–41)
Albumin: 3.1 g/dL — ABNORMAL LOW (ref 3.5–5.0)
Alkaline Phosphatase: 57 U/L (ref 38–126)
Bilirubin, Direct: 0.4 mg/dL — ABNORMAL HIGH (ref 0.0–0.2)
Indirect Bilirubin: 0.7 mg/dL (ref 0.3–0.9)
Total Bilirubin: 1.1 mg/dL (ref 0.3–1.2)
Total Protein: 5.9 g/dL — ABNORMAL LOW (ref 6.5–8.1)

## 2017-09-10 LAB — CBC
HCT: 38.3 % (ref 36.0–46.0)
Hemoglobin: 11.4 g/dL — ABNORMAL LOW (ref 12.0–15.0)
MCH: 30 pg (ref 26.0–34.0)
MCHC: 29.8 g/dL — ABNORMAL LOW (ref 30.0–36.0)
MCV: 100.8 fL — ABNORMAL HIGH (ref 78.0–100.0)
Platelets: 196 10*3/uL (ref 150–400)
RBC: 3.8 MIL/uL — ABNORMAL LOW (ref 3.87–5.11)
RDW: 19.6 % — ABNORMAL HIGH (ref 11.5–15.5)
WBC: 5.6 10*3/uL (ref 4.0–10.5)

## 2017-09-10 LAB — URINALYSIS, ROUTINE W REFLEX MICROSCOPIC
Bilirubin Urine: NEGATIVE
Glucose, UA: NEGATIVE mg/dL
Ketones, ur: NEGATIVE mg/dL
Nitrite: NEGATIVE
Protein, ur: 30 mg/dL — AB
RBC / HPF: >50 RBC/hpf — ABNORMAL HIGH (ref 0–5)
Specific Gravity, Urine: 1.014 (ref 1.005–1.030)
WBC, UA: >50 WBC/hpf — ABNORMAL HIGH (ref 0–5)
pH: 6 (ref 5.0–8.0)

## 2017-09-10 LAB — RENAL FUNCTION PANEL
Albumin: 3.1 g/dL — ABNORMAL LOW (ref 3.5–5.0)
Anion gap: 14 (ref 5–15)
BUN: 116 mg/dL — ABNORMAL HIGH (ref 8–23)
CO2: 24 mmol/L (ref 22–32)
Calcium: 8.8 mg/dL — ABNORMAL LOW (ref 8.9–10.3)
Chloride: 101 mmol/L (ref 98–111)
Creatinine, Ser: 2.56 mg/dL — ABNORMAL HIGH (ref 0.44–1.00)
GFR calc Af Amer: 20 mL/min — ABNORMAL LOW (ref >60)
GFR calc non Af Amer: 17 mL/min — ABNORMAL LOW (ref >60)
Glucose, Bld: 127 mg/dL — ABNORMAL HIGH (ref 70–99)
Phosphorus: 6.3 mg/dL — ABNORMAL HIGH (ref 2.5–4.6)
Potassium: 4.9 mmol/L (ref 3.5–5.1)
Sodium: 139 mmol/L (ref 135–145)

## 2017-09-10 LAB — HEPARIN LEVEL (UNFRACTIONATED): Heparin Unfractionated: >2.2 IU/mL — ABNORMAL HIGH (ref 0.30–0.70)

## 2017-09-10 LAB — APTT: aPTT: 38 seconds — ABNORMAL HIGH (ref 24–36)

## 2017-09-10 MED ORDER — MILRINONE LACTATE IN DEXTROSE 20-5 MG/100ML-% IV SOLN
0.1250 ug/kg/min | INTRAVENOUS | Status: DC
  Administered 2017-09-10 – 2017-09-14 (×10): 0.25 ug/kg/min via INTRAVENOUS
  Filled 2017-09-10 (×9): qty 100

## 2017-09-10 MED ORDER — DOBUTAMINE IN D5W 4-5 MG/ML-% IV SOLN
4.0000 ug/kg/min | INTRAVENOUS | Status: DC
  Administered 2017-09-10: 1 ug/kg/min via INTRAVENOUS
  Filled 2017-09-10: qty 250

## 2017-09-10 MED ORDER — HEPARIN (PORCINE) IN NACL 100-0.45 UNIT/ML-% IJ SOLN: 950.0000 [IU]/h | INTRAMUSCULAR | Status: DC

## 2017-09-10 MED ORDER — FUROSEMIDE 10 MG/ML IJ SOLN
80.0000 mg | Freq: Two times a day (BID) | INTRAMUSCULAR | Status: DC
  Administered 2017-09-10 – 2017-09-11 (×3): 80 mg via INTRAVENOUS
  Filled 2017-09-10 (×3): qty 8

## 2017-09-10 MED ORDER — APIXABAN 5 MG PO TABS
5.0000 mg | ORAL_TABLET | Freq: Two times a day (BID) | ORAL | Status: AC
  Administered 2017-09-10 – 2017-09-18 (×16): 5 mg via ORAL
  Filled 2017-09-10 (×16): qty 1

## 2017-09-10 MED ORDER — SODIUM CHLORIDE 0.9 % IV SOLN
1.0000 g | INTRAVENOUS | Status: AC
  Administered 2017-09-10 – 2017-09-16 (×7): 1 g via INTRAVENOUS
  Filled 2017-09-10 (×8): qty 10

## 2017-09-10 NOTE — Progress Notes (Signed)
[place] for Heparin>>apixaban Indication: atrial fibrillation  Allergies  Allergen Reactions  . Cephalexin     REACTION: Rash to arms/legs    Patient Measurements: Height: 5\' 4"  (162.6 cm) Weight: 240 lb 15.4 oz (109.3 kg) IBW/kg (Calculated) : 54.7 HEPARIN DW (KG): 79.6 .hep Vital Signs: Temp: 97.6 F (36.4 C) (07/10 1103) Temp Source: Oral (07/10 1103) BP: 103/64 (07/10 1415) Pulse Rate: 103 (07/10 1415)  Labs: Recent Labs    09/08/17 0529 09/09/17 0616 09/10/17 0506 09/10/17 1320 09/10/17 1340  HGB  --   --   --   --  11.4*  HCT  --   --   --   --  38.3  PLT  --   --   --   --  196  APTT  --   --   --   --  38*  HEPARINUNFRC  --   --   --  >2.20*  --   CREATININE 2.13* 2.19* 2.56*  2.60*  --   --     Estimated Creatinine Clearance: 22.6 mL/min (A) (by C-G formula based on SCr of 2.56 mg/dL (H)).   Medical History: Past Medical History:  Diagnosis Date  . Acute renal failure [facility])     hospitalized... December 08, 2009... improved with hydration in the hospital  . Anemia    further workup needed... October, 2011.  [name] Aortic insufficiency    mild...echo... June 2 010  . Arthritis    Hips/Knees, severe, requiring a walker  . Asymmetric septal hypertrophy (HCC)    echo....04/2009....patient encouraged the family to be screened elsewhere  . Atrial fibrillation ([place])     ??? atrial fibrillation during hospitalization October, 2011 ???..  . Bradycardia   . CAD (coronary artery disease)    Mild coronary disease, catheterization, 2009  . Cardiomyopathy, nonischemic ([place])    EF improved October, 2011  . Ejection fraction    EF 25%...nondiagnostic MRI December 2009...  /  echo June, 2010  /   echo... December 08, 2009.... ejection fraction improved... EF 60% /  EF 30%... echo.... [place]... December 28, 2009 with CHFPLanned ICD / CRT... patient has seen Dr.Klein..EF improved October, 2011   . Groin hematoma    Right-hematoma/abscess..repaired.. December, 2009  . Hypertension   . LBBB (left bundle branch block)   . LVH (left ventricular hypertrophy)    moderately severe... echo.. June, 2010  /  EF improved October, 2011.... cancel plans for ICD  . Mitral regurgitation    mild/ moderate...echo June, 2010  /   echo.. October, 2011.... mitral regurgitation  improved  . OSA (obstructive sleep apnea)   . Renal artery stenosis (HCC)    80% upper left   . Systolic heart failure    chronic    Medications:  Medications Prior to Admission  Medication Sig Dispense Refill Last Dose  . apixaban (ELIQUIS) 5 MG TABS tablet Take 1 tablet (5 mg total) by mouth 2 (two) times daily. 60 tablet 3   . aspirin 81 MG tablet Take 81 mg by mouth daily.     Taking  . calcitRIOL (ROCALTROL) 0.25 MCG capsule Take 0.25 mcg by mouth daily.   Taking  . carvedilol (COREG) 12.5 MG tablet Take 1.5 tablets (18.75 mg total) by mouth 2 (two) times daily. 270 tablet 1   . colchicine 0.6 MG tablet Take 0.6 mg by mouth as needed. Take as needed when gout flares up     . docusate  sodium (COLACE) 100 MG capsule Take 100 mg by mouth daily.   Taking  . ENTRESTO 49-51 MG TAKE 1 TABLET BY MOUTH TWICE DAILY 60 tablet 6 Taking  . ferrous sulfate 325 (65 FE) MG tablet Take 325 mg by mouth at bedtime.    Taking  . furosemide (LASIX) 80 MG tablet TAKE 1 TABLET EVERY MORNING (Patient taking differently: 80 mg 2 (two) times daily. TAKE 1 TABLET EVERY MORNING) 90 tablet 1   . isosorbide mononitrate (IMDUR) 60 MG 24 hr tablet TAKE 1 TABLET BY MOUTH EVERY DAY 90 tablet 0 Taking  . Multiple Vitamin (MULTIVITAMIN) tablet Take 1 tablet by mouth daily.   Taking  . Multiple Vitamins-Minerals (ICAPS PO) Take 1 capsule by mouth daily.   Taking  . potassium chloride SA (K-DUR,KLOR-CON) 20 MEQ tablet Take by mouth as needed. Pt states that take as need when blood work is done and how many meq to take       Assessment: 77 y.o.femalewith history of  chronic systolic HF and afib admitted with acute on chronic systolic HF. Patient on chronic eliquis - 5 mg twice daily (age<80, weight>60 kg, despite Scr>1.5).   Patient transferred from [facility] to [facility] this afternoon for further heart failure management. Okay with continuing apixaban at this time.  Goal of Therapy:  Monitor platelets by anticoagulation protocol: Yes   Plan:  Restart apixaban 5 mg twice daily tonight at 2200 Monitor Scr, CBC, and for s/sx of bleeding  Doylene Canard, PharmD Clinical Pharmacist  Pager: [phone] Phone: [phone] 09/10/2017,3:54 PM

## 2017-09-10 NOTE — Progress Notes (Addendum)
ANTICOAGULATION CONSULT NOTE - Initial Consult  Pharmacy Consult for Heparin Indication: atrial fibrillation  Allergies  Allergen Reactions  . Cephalexin     REACTION: Rash to arms/legs    Patient Measurements: Height: 5\' 4"  (162.6 cm) Weight: 240 lb 15.4 oz (109.3 kg) IBW/kg (Calculated) : 54.7 HEPARIN DW (KG): 79.6 .hep Vital Signs: Temp: 97.6 F (36.4 C) (07/10 1103) Temp Source: Oral (07/10 1103) BP: 105/79 (07/10 1015) Pulse Rate: 93 (07/10 1103)  Labs: Recent Labs    09/08/17 0529 09/09/17 0616 09/10/17 0506  CREATININE 2.13* 2.19* 2.56*  2.60*    Estimated Creatinine Clearance: 22.6 mL/min (A) (by C-G formula based on SCr of 2.56 mg/dL (H)).   Medical History: Past Medical History:  Diagnosis Date  . Acute renal failure [facility])     hospitalized... December 08, 2009... improved with hydration in the hospital  . Anemia    further workup needed... October, 2011.  [name] Aortic insufficiency    mild...echo... June 2 010  . Arthritis    Hips/Knees, severe, requiring a walker  . Asymmetric septal hypertrophy (HCC)    echo....04/2009....patient encouraged the family to be screened elsewhere  . Atrial fibrillation ([place])     ??? atrial fibrillation during hospitalization October, 2011 ???..  . Bradycardia   . CAD (coronary artery disease)    Mild coronary disease, catheterization, 2009  . Cardiomyopathy, nonischemic ([place])    EF improved October, 2011  . Ejection fraction    EF 25%...nondiagnostic MRI December 2009...  /  echo June, 2010  /   echo... December 08, 2009.... ejection fraction improved... EF 60% /  EF 30%... echo.... [place]... December 28, 2009 with CHFPLanned ICD / CRT... patient has seen Dr.Klein..EF improved October, 2011   . Groin hematoma    Right-hematoma/abscess..repaired.. December, 2009  . Hypertension   . LBBB (left bundle branch block)   . LVH (left ventricular hypertrophy)    moderately severe... echo.. June, 2010  /  EF improved October,  2011.... cancel plans for ICD  . Mitral regurgitation    mild/ moderate...echo June, 2010  /   echo.. October, 2011.... mitral regurgitation  improved  . OSA (obstructive sleep apnea)   . Renal artery stenosis (HCC)    80% upper left   . Systolic heart failure    chronic    Medications:  Medications Prior to Admission  Medication Sig Dispense Refill Last Dose  . apixaban (ELIQUIS) 5 MG TABS tablet Take 1 tablet (5 mg total) by mouth 2 (two) times daily. 60 tablet 3   . aspirin 81 MG tablet Take 81 mg by mouth daily.     Taking  . calcitRIOL (ROCALTROL) 0.25 MCG capsule Take 0.25 mcg by mouth daily.   Taking  . carvedilol (COREG) 12.5 MG tablet Take 1.5 tablets (18.75 mg total) by mouth 2 (two) times daily. 270 tablet 1   . colchicine 0.6 MG tablet Take 0.6 mg by mouth as needed. Take as needed when gout flares up     . docusate sodium (COLACE) 100 MG capsule Take 100 mg by mouth daily.   Taking  . ENTRESTO 49-51 MG TAKE 1 TABLET BY MOUTH TWICE DAILY 60 tablet 6 Taking  . ferrous sulfate 325 (65 FE) MG tablet Take 325 mg by mouth at bedtime.    Taking  . furosemide (LASIX) 80 MG tablet TAKE 1 TABLET EVERY MORNING (Patient taking differently: 80 mg 2 (two) times daily. TAKE 1 TABLET EVERY MORNING) 90 tablet 1   .  isosorbide mononitrate (IMDUR) 60 MG 24 hr tablet TAKE 1 TABLET BY MOUTH EVERY DAY 90 tablet 0 Taking  . Multiple Vitamin (MULTIVITAMIN) tablet Take 1 tablet by mouth daily.   Taking  . Multiple Vitamins-Minerals (ICAPS PO) Take 1 capsule by mouth daily.   Taking  . potassium chloride SA (K-DUR,KLOR-CON) 20 MEQ tablet Take by mouth as needed. Pt states that take as need when blood work is done and how many meq to take       Assessment: 77 y.o.femalewith history of chronic systolic HF and afib admitted with acute on chronic systolic HF. Patient on chronic eliquis, but MD wants to transition to heparin in case and invasive procedure is warranted. No boluses and will start heparin  approximately 12 hours after last dose given 09/10/17 at 0900. Monitoring APTT with anticipation that anti-Xa level will not correlate for a couple of days with true level of anticoagulation.  Goal of Therapy:  Heparin level 0.3-0.7 units/ml aPTT 66-102 seconds Monitor platelets by anticoagulation protocol: Yes   Plan:  Start heparin infusion at 950 units/hr at 2200 Check APTT and anti-Xa level in 6-8 hours and daily while on heparin Continue to monitor H&H and platelets  Isac Sarna, BS [place], BCPS Clinical Pharmacist Pager [phone] 09/10/2017,12:48 PM

## 2017-09-10 NOTE — Progress Notes (Signed)
Central line venous O2 sat 43%, CVP 20. We will start dobutamine at 1 mcg/kg/min, stop beta blocker. Titrate dobutamine as tolerated later in the morning, will need to watch her afib closely. Stop torsemide, retry IV diuretics once titrated up on dobutamine.    Carlyle Dolly MD

## 2017-09-10 NOTE — Consult Note (Addendum)
Advanced Heart Failure Team Consult Note   Primary Physician: Glenda Chroman, MD PCP-Cardiologist:  Carlyle Dolly, MD  Reason for Consultation: A/C Systolic Heart Failure   HPI:    Gina [name] is seen today for evaluation of heart failure at the request of Dr Harl Bowie.   Gina [name] is a 77 year old with a history chronic systolic heart failure, anemia, htn, LBBB, renal artery stenosis, and OSA.    Followed closely by Dr Harl Bowie. EF has been down since 2015.  Offered CRT-D but she refused. She is now very interested in CRT-D   Hospitalized at [facility] in early June with UTI and AKI. Diuretics held at the time of discharge. She was again hospitalized later that month with volume overload. Diuresed with IV lasix and transitioned to lasix 80 mg twice a day.   She presented to [facility] on 7/2 with increased dyspnea and leg edema. Admitted with A/C systolic heart failure and A fib RVR. Diuresing with IV lasix with worsening creatinine.  She has been in A fib RVR so amio drip started . CO-OX low so dobutamine was started this morning. BB stopped today.  Creatinine has been trending up. Poor diuresis noted.   Transferred to [facility] for advanced heart failure consultation. Complaining of SOB at rest.   Echo 09/09/2017  Left ventricle: The cavity size was mildly dilated. Wall   thickness was increased in a pattern of moderate LVH. Systolic   function was severely reduced. The estimated ejection fraction   was = 20%. Diffuse hypokinesis. The study was not technically   sufficient to allow evaluation of LV diastolic dysfunction due to   atrial fibrillation. - Aortic valve: Mildly calcified annulus. Trileaflet; normal   thickness leaflets. Valve area (VTI): 2.39 cm^2. Valve area   (Vmax): 2.18 cm^2. - Mitral valve: There was mild regurgitation. - Left atrium: The atrium was severely dilated. - Right ventricle: The cavity size was mildly dilated. Systolic   function was mildly to  moderately reduced. - Right atrium: The atrium was moderately dilated. - Atrial septum: No defect or patent foramen ovale was identified. - Pulmonary arteries: Systolic pressure was mildly increased. PA   peak pressure: 37 mm Hg (S).   Review of Systems: [y] = yes, [ ]  = no   General: Weight gain [Y ]; Weight loss [ ] ; Anorexia [ ] ; Fatigue [ Y]; Fever [ ] ; Chills [ ] ; Weakness [Y ]  Cardiac: Chest pain/pressure [ ] ; Resting SOB [Y ]; Exertional SOB [Y ]; Orthopnea [ ] ; Pedal Edema [ Y]; Palpitations [ ] ; Syncope [ ] ; Presyncope [ ] ; Paroxysmal nocturnal dyspnea[ ]   Pulmonary: Cough [ ] ; Wheezing[ ] ; Hemoptysis[ ] ; Sputum [ ] ; Snoring [ ]   GI: Vomiting[ ] ; Dysphagia[ ] ; Melena[ ] ; Hematochezia [ ] ; Heartburn[ ] ; Abdominal pain [ ] ; Constipation [ ] ; Diarrhea [ ] ; BRBPR [ ]   GU: Hematuria[ ] ; Dysuria [ ] ; Nocturia[ ]   Vascular: Pain in legs with walking [ ] ; Pain in feet with lying flat [ ] ; Non-healing sores [ ] ; Stroke [ ] ; TIA [ ] ; Slurred speech [ ] ;  Neuro: Headaches[ ] ; Vertigo[ ] ; Seizures[ ] ; Paresthesias[ ] ;Blurred vision [ ] ; Diplopia [ ] ; Vision changes [ ]   Ortho/Skin: Arthritis [ ] ; Joint pain [ Y]; Muscle pain [ ] ; Joint swelling [ ] ; Back Pain [ ] ; Rash [ ]   Psych: Depression[ ] ; Anxiety[ ]   Heme: Bleeding problems [ ] ; Clotting disorders [ ] ; Anemia Jazmín.Cullens ]  Endocrine: Diabetes [ ] ; Thyroid dysfunction[ ]   Home Medications Prior to Admission medications   Medication Sig Start Date End Date Taking? Authorizing Provider  apixaban (ELIQUIS) 5 MG TABS tablet Take 1 tablet (5 mg total) by mouth 2 (two) times daily. 08/15/17  Yes BranchAlphonse Guild, MD  aspirin 81 MG tablet Take 81 mg by mouth daily.     Yes [provider]  calcitRIOL (ROCALTROL) 0.25 MCG capsule Take 0.25 mcg by mouth daily.   Yes [provider]  carvedilol (COREG) 12.5 MG tablet Take 1.5 tablets (18.75 mg total) by mouth 2 (two) times daily. 08/15/17 11/13/17 Yes BranchAlphonse Guild, MD    colchicine 0.6 MG tablet Take 0.6 mg by mouth as needed. Take as needed when gout flares up   Yes [provider]  docusate sodium (COLACE) 100 MG capsule Take 100 mg by mouth daily.   Yes [provider]  ENTRESTO 49-51 MG TAKE 1 TABLET BY MOUTH TWICE DAILY 02/24/17  Yes Branch, Alphonse Guild, MD  ferrous sulfate 325 (65 FE) MG tablet Take 325 mg by mouth at bedtime.    Yes [provider]  furosemide (LASIX) 80 MG tablet TAKE 1 TABLET EVERY MORNING Patient taking differently: 80 mg 2 (two) times daily. TAKE 1 TABLET EVERY MORNING 08/15/17  Yes Branch, Alphonse Guild, MD  isosorbide mononitrate (IMDUR) 60 MG 24 hr tablet TAKE 1 TABLET BY MOUTH EVERY DAY 06/25/17  Yes Branch, Alphonse Guild, MD  Multiple Vitamin (MULTIVITAMIN) tablet Take 1 tablet by mouth daily.   Yes [provider]  Multiple Vitamins-Minerals (ICAPS PO) Take 1 capsule by mouth daily.   Yes [provider]  potassium chloride SA (K-DUR,KLOR-CON) 20 MEQ tablet Take by mouth as needed. Pt states that take as need when blood work is done and how many meq to take    [provider]    Past Medical History: Past Medical History:  Diagnosis Date  . Acute renal failure [facility])     hospitalized... December 08, 2009... improved with hydration in the hospital  . Anemia    further workup needed... October, 2011.  [name] Aortic insufficiency    mild...echo... June 2 010  . Arthritis    Hips/Knees, severe, requiring a walker  . Asymmetric septal hypertrophy (HCC)    echo....04/2009....patient encouraged the family to be screened elsewhere  . Atrial fibrillation ([place])     ??? atrial fibrillation during hospitalization October, 2011 ???..  . Bradycardia   . CAD (coronary artery disease)    Mild coronary disease, catheterization, 2009  . Cardiomyopathy, nonischemic ([place])    EF improved October, 2011  . Ejection fraction    EF 25%...nondiagnostic MRI December 2009...  /  echo June, 2010  /   echo...  December 08, 2009.... ejection fraction improved... EF 60% /  EF 30%... echo.... [place]... December 28, 2009 with CHFPLanned ICD / CRT... patient has seen Dr.Klein..EF improved October, 2011   . Groin hematoma    Right-hematoma/abscess..repaired.. December, 2009  . Hypertension   . LBBB (left bundle branch block)   . LVH (left ventricular hypertrophy)    moderately severe... echo.. June, 2010  /  EF improved October, 2011.... cancel plans for ICD  . Mitral regurgitation    mild/ moderate...echo June, 2010  /   echo.. October, 2011.... mitral regurgitation  improved  . OSA (obstructive sleep apnea)   . Renal artery stenosis (HCC)    80% upper left   . Systolic heart  failure    chronic    Past Surgical History: Past Surgical History:  Procedure Laterality Date  . ABDOMINAL HYSTERECTOMY    . CARDIAC CATHETERIZATION N/A 08/14/2015   Procedure: Right/Left Heart Cath and Coronary Angiography;  Surgeon: Leonie Man, MD;  Location: [place] [facility];  Service: Cardiovascular;  Laterality: N/A;  . REPLACEMENT TOTAL HIP W/  RESURFACING IMPLANTS    . REPLACEMENT TOTAL KNEE    . Surgical Repair of a Catheteriztion associated femoral artery injury      Family History: Family History  Problem Relation Age of Onset  . Rheumatic fever Mother        in her early 81's  . Alzheimer's disease Mother   . Stomach cancer Father 10       died 60  . Bradycardia Brother        has pacemaker  . Heart failure Paternal Grandmother   . Heart failure Paternal Grandfather     Social History: Social History   Socioeconomic History  . Marital status: Married    Spouse name: Not on file  . Number of children: 0  . Years of education: Not on file  . Highest education level: Not on file  Occupational History  . Not on file  Social Needs  . Financial resource strain: Not on file  . Food insecurity:    Worry: Not on file    Inability: Not on file  . Transportation needs:    Medical: Not  on file    Non-medical: Not on file  Tobacco Use  . Smoking status: Former Smoker    Packs/day: 1.00    Years: 24.00    Pack years: 24.00    Types: Cigarettes    Start date: 10/29/1958    Last attempt to quit: 12/02/1985    Years since quitting: 31.7  . Smokeless tobacco: Never Used  Substance and Sexual Activity  . Alcohol use: No    Alcohol/week: 0.0 oz  . Drug use: Not on file  . Sexual activity: Not on file  Lifestyle  . Physical activity:    Days per week: Not on file    Minutes per session: Not on file  . Stress: Not on file  Relationships  . Social connections:    Talks on phone: Not on file    Gets together: Not on file    Attends religious service: Not on file    Active member of club or organization: Not on file    Attends meetings of clubs or organizations: Not on file    Relationship status: Not on file  Other Topics Concern  . Not on file  Social History Narrative  . Not on file    Allergies:  Allergies  Allergen Reactions  . Cephalexin     REACTION: Rash to arms/legs    Objective:    Vital Signs:   Temp:  [97.2 F (36.2 C)-98.7 F (37.1 C)] 97.3 F (36.3 C) (07/10 0735) Pulse Rate:  [87-119] 92 (07/10 0900) Resp:  [0-47] 24 (07/10 0900) BP: (93-117)/(60-95) 105/75 (07/10 0900) SpO2:  [85 %-98 %] 97 % (07/10 0900) Weight:  [234 lb 9.1 oz (106.4 kg)-240 lb 15.4 oz (109.3 kg)] 240 lb 15.4 oz (109.3 kg) (07/10 0500) Last BM Date: 09/08/17  Weight change: Filed Weights   09/09/17 0513 09/09/17 1245 09/10/17 0500  Weight: 238 lb 5.1 oz (108.1 kg) 234 lb 9.1 oz (106.4 kg) 240 lb 15.4 oz (109.3 kg)    Intake/Output:  Intake/Output Summary (Last 24 hours) at 09/10/2017 0936 Last data filed at 09/10/2017 0816 Gross per 24 hour  Intake 1352.31 ml  Output 800 ml  Net 552.31 ml      Physical Exam    General: SOB talking.  HEENT: normal Neck: supple. JVP to jaw . Carotids 2+ bilat; no bruits. No lymphadenopathy or thyromegaly appreciated. RIJ  double lumen  Cor: PMI nondisplaced. Irregular Regular rate & rhythm. No rubs, gallops or murmurs. Lungs: clear on 2 liters  Abdomen: soft, nontender, nondistended. No hepatosplenomegaly. No bruits or masses. Good bowel sounds. Extremities: cool, no cyanosis, clubbing, rash, R and LLE 3+ edema.  LLE pink RLE chronic hyperpigmentation.  Neuro: alert & orientedx3, cranial nerves grossly intact. moves all 4 extremities w/o difficulty. Affect pleasant.   Telemetry   A fib RVR 110-120s personally reviewed  EKG    A fib RVR 102 bpm LBBB  Labs   Basic Metabolic Panel: Recent Labs  Lab 09/06/17 0606 09/07/17 0633 09/08/17 0529 09/09/17 0616 09/10/17 0506  NA 141 143 142 140 139  139  K 4.0 3.7 3.7 4.2 4.9  4.9  CL 103 106 106 105 101  102  CO2 26 26 24 24 24  24   GLUCOSE 98 95 106* 113* 127*  128*  BUN 96* 94* 94* 101* 116*  116*  CREATININE 2.51* 2.25* 2.13* 2.19* 2.56*  2.60*  CALCIUM 8.4* 8.4* 8.3* 8.5* 8.8*  8.8*  PHOS 5.0* 4.6 4.8* 5.1* 6.3*    Liver Function Tests: Recent Labs  Lab 09/06/17 0606 09/07/17 5465 09/08/17 0529 09/09/17 0616 09/10/17 0506  AST  --   --   --   --  150*  ALT  --   --   --   --  178*  ALKPHOS  --   --   --   --  57  BILITOT  --   --   --   --  1.1  PROT  --   --   --   --  5.9*  ALBUMIN 3.1* 2.9* 2.9* 3.0* 3.1*  3.1*   No results for input(s): LIPASE, AMYLASE in the last 168 hours. No results for input(s): AMMONIA in the last 168 hours.  CBC: Recent Labs  Lab 09/06/17 0606  WBC 7.4  HGB 10.7*  HCT 36.5  MCV 104.6*  PLT 156    Cardiac Enzymes: Recent Labs  Lab 09/03/17 1044  TROPONINI 0.04*    BNP: BNP (last 3 results) Recent Labs    09/02/17 1512  BNP >4,500.0*    ProBNP (last 3 results) No results for input(s): PROBNP in the last 8760 hours.   CBG: No results for input(s): GLUCAP in the last 168 hours.  Coagulation Studies: No results for input(s): LABPROT, INR in the last 72 hours.   Imaging    Dg Chest Port 1 View  Result Date: 09/09/2017 CLINICAL DATA:  Central line placement. EXAM: PORTABLE CHEST 1 VIEW COMPARISON:  Radiograph earlier this day at 1027 hour FINDINGS: Tip of the right internal jugular central venous catheter in the mid SVC. No pneumothorax. Moderate to marked cardiomegaly is again seen, unchanged from prior. Vascular congestion. Hazy opacity at the right lung base likely combination of pleural effusion and atelectasis. No new focal airspace disease. IMPRESSION: 1. Tip of the right central line in the mid SVC.  No pneumothorax. 2. Unchanged cardiomegaly, vascular congestion and right pleural effusion. Electronically Signed   By: Jeb Levering M.D.   On: 09/09/2017  22:16   Dg Chest Port 1 View  Result Date: 09/09/2017 CLINICAL DATA:  Shortness of breath EXAM: PORTABLE CHEST 1 VIEW COMPARISON:  Chest x-ray of 09/02/2017 FINDINGS: Again the lungs are poorly aerated. Moderate cardiomegaly is again noted and pericardial effusion cannot be excluded. There is mild pulmonary vascular congestion present. There are degenerative changes noted in both shoulders. IMPRESSION: 1. Poor inspiration. 2. Stable moderate cardiomegaly. Probable mild pulmonary vascular congestion. 3. Cannot exclude pericardial effusion. Electronically Signed   By: Ivar Drape M.D.   On: 09/09/2017 10:43      Medications:     Current Medications: . apixaban  5 mg Oral BID  . Chlorhexidine Gluconate Cloth  6 each Topical Daily  . sodium chloride flush  10-40 mL Intracatheter Q12H  . sodium chloride flush  3 mL Intravenous Q12H     Infusions: . sodium chloride    . amiodarone 30 mg/hr (09/10/17 0240)  . DOBUTamine 2 mcg/kg/min (09/10/17 9735)       Patient Profile   Gina [name] is a 77 year old with a history chronic systolic heart failure, anemia, htn, LBBB, renal artery stenosis, and OSA.   Transferred to [facility] on 7/10 with cardiogenic shock, Afib RVR, and volume overload.  Assessment/Plan    1. Cardiogenic Shock  EF has been low 20-25% over 4 years. NICM cath 2009 normal cors.  ECHO 09/09/2017 EF 20% RV mildly dilated . Check SPEP/UPEP.  -CO-OX 40% on dobutamine 2 mcg. Stop dobutamine and start milrinone 0.25 mcg.   -Start 80 mg IV lasix twice a day. First dose now. Set up CVP.  - Will need foley cath. Strict I/Os  -No bb with shock. Last dose  Toprol xl stopped 09/09/2017  -No arb/sprio/dig with elevated creatinine -Follow renal function closely  2. A fib RVR On amio drip + apixaban.  Will eventually need cardioversion once diuresed.    3. AKI Worsening creatinine 2.4>2.56.  Renal US 09/25/2016 negative. She does not take NSAIDs.   4. LBBB QRS 200 Gina on EKG  She is open to CRT-D.  5. Transaminitis Elevated AST/ALT likely in the setting of cardiogenic shock. Follow daily. Hopefully will come down with inotropes.   6. OSA  Medication concerns reviewed with patient and pharmacy team. Barriers identified: not at this time.   Length of Stay: [place], NP  09/10/2017, 9:36 AM  Advanced Heart Failure Team Pager [phone] (M-F; 7a - 4p)  Please contact [facility] for night-coverage after hours (4p -7a ) and weekends on [email or web address]  Patient seen with NP, agree with the above note.    She was admitted to [facility] with atrial fibrillation/RVR and acute/chronic systolic CHF/cardiogenic shock.  Creatinine up to 2.6 range from her baseline around 1.6.  She had central line placed today, co-ox was 41% so she was started on low dose dobutamine this morning.  CVP is 17 on my measurement this afternoon.   On exam, JVP 16 cm. Irregular S1S2, no murmur (soft heart sounds).  Crackles at bases.  2+ edema to knees bilaterally.   1. Acute on chronic systolic CHF: Echo (6/83) with EF 20%.  Long-standing cardiomyopathy.  Nonischemic based on cath in 2017.  She has low output HF with co-ox 41%, CVP 17, AKI on CKD stage 3 ("cold and wet").  Exacerbation triggered by atrial  fibrillation and also by being off Lasix for a number of days after hospitalization at [facility] for UTI.  - Switched dobutamine to milrinone 0.25.  Co-ox in am.  - Lasix 80 mg IV bid.  - No ACEI/ARB/ARNI/digoxin/spironolactone with AKI.  - No beta blocker with low output.  - Has wide LBBB, ultimately needs CRT.  2. AKI on CKD stage 3: Suspect cardiorenal syndrome with low output HF.  No NSAID use.   - Starting milrinone to improve cardiac output.  - Diuresis with fall in renal venous pressure may help as well.  3. Atrial fibrillation: Admitted with afib/RVR, has been in afib for about 6 wks.  Has been on Eliquis for about 4 weeks without missing doses. Rate is controlled currently.  - Continue Eliquis.  - Amiodarone gtt for rate control.  - Plan DCCV after some diuresis, possibly Friday.  4. Elevated LFTs: Suspect shock liver with low cardiac output.  Follow CMET.   [name] 09/10/2017 5:21 PM

## 2017-09-10 NOTE — Progress Notes (Addendum)
Progress Note  Patient Name: Gina [name] Date of Encounter: 09/10/2017  Primary Cardiologist: Carlyle Dolly, MD   Subjective   Ongoing SOB this AM.   Inpatient Medications    Scheduled Meds: . apixaban  5 mg Oral BID  . Chlorhexidine Gluconate Cloth  6 each Topical Daily  . sodium chloride flush  10-40 mL Intracatheter Q12H  . sodium chloride flush  3 mL Intravenous Q12H   Continuous Infusions: . sodium chloride    . amiodarone 30 mg/hr (09/10/17 4650)  . DOBUTamine 1 mcg/kg/min (09/10/17 0709)   PRN Meds: sodium chloride, acetaminophen, bisacodyl, ondansetron (ZOFRAN) IV, sodium chloride flush, sodium chloride flush, traMADol   Vital Signs    Vitals:   09/10/17 0615 09/10/17 0630 09/10/17 0645 09/10/17 0735  BP: 103/78 107/73 109/88   Pulse: 99 96 90 92  Resp: (!) 29 (!) 31 18 (!) 22  Temp:    (!) 97.3 F (36.3 C)  TempSrc:    Oral  SpO2: 92% 91% (!) 86% 97%  Weight:      Height:        Intake/Output Summary (Last 24 hours) at 09/10/2017 0807 Last data filed at 09/10/2017 0400 Gross per 24 hour  Intake 1283.72 ml  Output 800 ml  Net 483.72 ml   Filed Weights   09/09/17 0513 09/09/17 1245 09/10/17 0500  Weight: 238 lb 5.1 oz (108.1 kg) 234 lb 9.1 oz (106.4 kg) 240 lb 15.4 oz (109.3 kg)    Telemetry    afib rates 80s to 90s- Personally Reviewed  ECG    na  Physical Exam   GEN: No acute distress.   Neck: elevated jvd Cardiac: irreg, no m/r/g Respiratory: Clear to auscultation bilaterally. GI: Soft, nontender, non-distended  MS: 2-3+ bilateral LE edema; No deformity. Neuro:  Nonfocal  Psych: Normal affect   Labs    Chemistry Recent Labs  Lab 09/08/17 0529 09/09/17 0616 09/10/17 0506  NA 142 140 139  139  K 3.7 4.2 4.9  4.9  CL 106 105 101  102  CO2 24 24 24  24   GLUCOSE 106* 113* 127*  128*  BUN 94* 101* 116*  116*  CREATININE 2.13* 2.19* 2.56*  2.60*  CALCIUM 8.3* 8.5* 8.8*  8.8*  PROT  --   --  5.9*  ALBUMIN 2.9*  3.0* 3.1*  3.1*  AST  --   --  150*  ALT  --   --  178*  ALKPHOS  --   --  57  BILITOT  --   --  1.1  GFRNONAA 21* 21* 17*  17*  GFRAA 25* 24* 20*  19*  ANIONGAP 12 11 14  13      Hematology Recent Labs  Lab 09/06/17 0606  WBC 7.4  RBC 3.49*  HGB 10.7*  HCT 36.5  MCV 104.6*  MCH 30.7  MCHC 29.3*  RDW 21.4*  PLT 156    Cardiac Enzymes Recent Labs  Lab 09/03/17 1044  TROPONINI 0.04*   No results for input(s): TROPIPOC in the last 168 hours.   BNPNo results for input(s): BNP, PROBNP in the last 168 hours.   DDimer No results for input(s): DDIMER in the last 168 hours.   Radiology    Dg Chest Port 1 View  Result Date: 09/09/2017 CLINICAL DATA:  Central line placement. EXAM: PORTABLE CHEST 1 VIEW COMPARISON:  Radiograph earlier this day at 1027 hour FINDINGS: Tip of the right internal jugular central venous catheter in the  mid SVC. No pneumothorax. Moderate to marked cardiomegaly is again seen, unchanged from prior. Vascular congestion. Hazy opacity at the right lung base likely combination of pleural effusion and atelectasis. No new focal airspace disease. IMPRESSION: 1. Tip of the right central line in the mid SVC.  No pneumothorax. 2. Unchanged cardiomegaly, vascular congestion and right pleural effusion. Electronically Signed   By: Jeb Levering M.D.   On: 09/09/2017 22:16   Dg Chest Port 1 View  Result Date: 09/09/2017 CLINICAL DATA:  Shortness of breath EXAM: PORTABLE CHEST 1 VIEW COMPARISON:  Chest x-ray of 09/02/2017 FINDINGS: Again the lungs are poorly aerated. Moderate cardiomegaly is again noted and pericardial effusion cannot be excluded. There is mild pulmonary vascular congestion present. There are degenerative changes noted in both shoulders. IMPRESSION: 1. Poor inspiration. 2. Stable moderate cardiomegaly. Probable mild pulmonary vascular congestion. 3. Cannot exclude pericardial effusion. Electronically Signed   By: Ivar Drape M.D.   On: 09/09/2017 10:43      Cardiac Studies    Patient Profile     77 y.o. female with history of chronic systolic HF and afib admitted with acute on chronic systolic HF.      Assessment & Plan    1. Acute on chronic left ventricular systolic HF/RV failure. Now with low output heart failure/cardiogenic shock - admitted with severe volume overload and weight gain. Failure to diurese with worsening renal function during this admission - central line placed yesterday. CVP 20, central line venous O2 sat 43%. Maintaining MAPs but evidence of worsening renal failure, hepatic dysfunction consistent with inadequate perfusion.  - beta blocker stopped. Started on low dose dobutamine this AM (low dose due to afib with RVR). Heart rates look good so far, increase dobutamine to 9mcg/kg/min, recheck coox later this AM. Titrate as needed pending heart rate. Retry IV diuretics once settled on IV dobutamine - no ACE/ARB/ARNI/aldactone due to poor renal function.  - will need transfer to stepdown/medicine service at [facility] to be evaluated by CHF team - previously refused CRT-D, now open for it and is good candidate.   2. Afib - fairly new diagnosis within the last month - afib with RVR yesterday, soft bp's. Started on amio gtt.  - beta blocker stopped due to low central venous sat and need for dobutamine - rates 90s this AM on very low dose dobutamine, follow as we titrate dobutamine - consider DCCV once more euvolemic, hesistant for sedation at this time given her respiratory status. She has been on anticoag over 3 weeks  3. AKI on CKD - followed by renal - appears to be a large component of low flow low cardiac output as etiology - retry IV diuretics once settled on IV dobutamine    1225pm addendum No stepdown beds available. Spoke with Dr Marigene Ehlers who will accept patient on CHF service to CCU bed. Dr Jerilee Hoh to help facilitate the transfer.    For questions or updates, please contact [place] Please consult  [email or web address] for contact info under Cardiology/STEMI.      Merrily Pew, MD  09/10/2017, 8:07 AM

## 2017-09-10 NOTE — Progress Notes (Signed)
Pharmacy notified of Rocephin ordered for UTI. Patient has allergy to Cephalexin.  Rocephin held until further instructions from pharmacy. [place], Ardeth Sportsman

## 2017-09-10 NOTE — Progress Notes (Signed)
MARGREE GIMBEL  MRN: [id number]  DOB/AGE: 1940-12-11 77 y.o.  Primary Care Physician:Vyas, Costella Hatcher, MD  Admit date: 09/02/2017  Chief Complaint:  Chief Complaint  Patient presents with  . Shortness of Breath    S-Pt presented on  09/02/2017 with  Chief Complaint  Patient presents with  . Shortness of Breath  .    Pt offer no complaints    Meds . apixaban  5 mg Oral BID  . Chlorhexidine Gluconate Cloth  6 each Topical Daily  . sodium chloride flush  10-40 mL Intracatheter Q12H  . sodium chloride flush  3 mL Intravenous Q12H       Physical Exam: Vital signs in last 24 hours: Temp:  [97.2 F (36.2 C)-98.7 F (37.1 C)] 97.3 F (36.3 C) (07/10 0735) Pulse Rate:  [87-119] 92 (07/10 0900) Resp:  [0-47] 24 (07/10 0900) BP: (93-117)/(60-95) 105/75 (07/10 0900) SpO2:  [85 %-98 %] 97 % (07/10 0900) Weight:  [234 lb 9.1 oz (106.4 kg)-240 lb 15.4 oz (109.3 kg)] 240 lb 15.4 oz (109.3 kg) (07/10 0500) Weight change: -3 lb 12 oz (-1.7 kg) Last BM Date: 09/08/17  Intake/Output from previous day: 07/09 0701 - 07/10 0700 In: 1283.7 [P.O.:960; I.V.:323.7] Out: 800 [Urine:800] Total I/O In: 68.6 [I.V.:68.6] Out: -    Physical Exam: General- pt is awake,alert, oriented to time place and person Resp- No acute REsp distress, CTA B/L NO Rhonchi CVS- S1S2 irregular in rate and rhythm GIT- BS+, soft, NT, ND EXT- NO LE Edema, Cyanosis   Lab Results: CBC No results for input(s): WBC, HGB, HCT, PLT in the last 72 hours.  BMET Recent Labs    09/09/17 0616 09/10/17 0506  NA 140 139  139  K 4.2 4.9  4.9  CL 105 101  102  CO2 24 24  24   GLUCOSE 113* 127*  128*  BUN 101* 116*  116*  CREATININE 2.19* 2.56*  2.60*  CALCIUM 8.5* 8.8*  8.8*   Creat trend 2019   2.3=>2.8=>2.1=>2.6 2017  1.6 2009  1.1--1.6  MICRO Recent Results (from the past 240 hour(s))  MRSA PCR Screening     Status: None   Collection Time: 09/09/17  6:55 PM  Result Value Ref Range Status   MRSA by PCR NEGATIVE NEGATIVE Final    Comment:        The GeneXpert MRSA Assay (FDA approved for NASAL specimens only), is one component of a comprehensive MRSA colonization surveillance program. It is not intended to diagnose MRSA infection nor to guide or monitor treatment for MRSA infections. Performed at [facility], [address] [place], [place] [address]       Lab Results  Component Value Date   CALCIUM 8.8 (L) 09/10/2017   CALCIUM 8.8 (L) 09/10/2017   PHOS 6.3 (H) 09/10/2017           mpression: 1)Renal  AKI secondary to Cardiorenal                AKI on CKD               CKD stage 4 .               CKD since 2009               CKD secondary to HTN/Cardiorenal/Age asso decline                AKi worseing  I had extensive discussion with pt about possible need of renal replacement therapy.                Pt asked me relevant questions. I answered the questions to best of my ability.                2)HTN  Medication-  On Diuretics     3)Anemia HGb at goal (9--11)   4)CKD Mineral-Bone Disorder  Phosphorus at goal for this state. Calcium when corrected for low albumin is at goal.  5)CHF-admitted with CHF exacerbation On Diuretics Now worsening On Dobutamine drip Cardiology and  Primary MD following  6)Electrolytes Normokalemic NOrmonatremic   7)Acid base Co2 at goal     Plan:  Agree with current tx and plan Pt is to be transferred to tertiary level of care.      [place] S 09/10/2017, 10:24 AM

## 2017-09-10 NOTE — Progress Notes (Deleted)
Cardiology Office Note    Date:  09/10/2017   ID:  Gina [name], DOB Mar 05, 1940, MRN [id number]  PCP:  Glenda Chroman, MD  Cardiologist: Carlyle Dolly, MD  No chief complaint on file.   History of Present Illness:  Gina [name] is a 77 y.o. female with history of nonischemic cardiomyopathy LVEF 20 to 25% with history of patent coronaries on cath in 2017.  Was hospitalized in [place] in June with acute on chronic systolic CHF, UTI and new atrial fibrillation.  She was started on Cardizem and Eliquis 2.5 mg twice daily.  Patient has had 3 hospitalizations in a month for CHF. She does not seem to be tolerating her atrial fibrillation even when rate is controlled.  Diuretics were held when she was discharged from [place].  She was readmitted 09/02/2017 with cardiogenic shock.  Worsening renal function, elevated transaminitis Dr. Harl Bowie was concerned about sedation with DCCV so recommended amiodarone drip to try to convert her.  2D echo 09/09/2017 LVEF 20% with diffuse hypokinesis, mild MR, severely dilated left atrium RV function systolic function was mild to moderately reduced.   Past Medical History:  Diagnosis Date  . Acute renal failure [facility])     hospitalized... December 08, 2009... improved with hydration in the hospital  . Anemia    further workup needed... October, 2011.  [name] Aortic insufficiency    mild...echo... June 2 010  . Arthritis    Hips/Knees, severe, requiring a walker  . Asymmetric septal hypertrophy (HCC)    echo....04/2009....patient encouraged the family to be screened elsewhere  . Atrial fibrillation ([place])     ??? atrial fibrillation during hospitalization October, 2011 ???..  . Bradycardia   . CAD (coronary artery disease)    Mild coronary disease, catheterization, 2009  . Cardiomyopathy, nonischemic ([facility])    EF improved October, 2011  . Ejection fraction    EF 25%...nondiagnostic MRI December 2009...  /  echo June, 2010  /   echo... December 08, 2009.... ejection fraction improved... EF 60% /  EF 30%... echo.... [place]... December 28, 2009 with CHFPLanned ICD / CRT... patient has seen Dr.Klein..EF improved October, 2011   . Groin hematoma    Right-hematoma/abscess..repaired.. December, 2009  . Hypertension   . LBBB (left bundle branch block)   . LVH (left ventricular hypertrophy)    moderately severe... echo.. June, 2010  /  EF improved October, 2011.... cancel plans for ICD  . Mitral regurgitation    mild/ moderate...echo June, 2010  /   echo.. October, 2011.... mitral regurgitation  improved  . OSA (obstructive sleep apnea)   . Renal artery stenosis (HCC)    80% upper left   . Systolic heart failure    chronic    Past Surgical History:  Procedure Laterality Date  . ABDOMINAL HYSTERECTOMY    . CARDIAC CATHETERIZATION N/A 08/14/2015   Procedure: Right/Left Heart Cath and Coronary Angiography;  Surgeon: Leonie Man, MD;  Location: [facility];  Service: Cardiovascular;  Laterality: N/A;  . REPLACEMENT TOTAL HIP W/  RESURFACING IMPLANTS    . REPLACEMENT TOTAL KNEE    . Surgical Repair of a Catheteriztion associated femoral artery injury      Current Medications: No outpatient medications have been marked as taking for the 09/22/17 encounter (Appointment) with Imogene Burn, PA-C.     Allergies:   Cephalexin   Social History   Socioeconomic History  . Marital status: Married    Spouse name: Not  on file  . Number of children: 0  . Years of education: Not on file  . Highest education level: Not on file  Occupational History  . Not on file  Social Needs  . Financial resource strain: Not on file  . Food insecurity:    Worry: Not on file    Inability: Not on file  . Transportation needs:    Medical: Not on file    Non-medical: Not on file  Tobacco Use  . Smoking status: Former Smoker    Packs/day: 1.00    Years: 24.00    Pack years: 24.00    Types: Cigarettes    Start date: 10/29/1958    Last  attempt to quit: 12/02/1985    Years since quitting: 31.7  . Smokeless tobacco: Never Used  Substance and Sexual Activity  . Alcohol use: No    Alcohol/week: 0.0 oz  . Drug use: Not on file  . Sexual activity: Not on file  Lifestyle  . Physical activity:    Days per week: Not on file    Minutes per session: Not on file  . Stress: Not on file  Relationships  . Social connections:    Talks on phone: Not on file    Gets together: Not on file    Attends religious service: Not on file    Active member of club or organization: Not on file    Attends meetings of clubs or organizations: Not on file    Relationship status: Not on file  Other Topics Concern  . Not on file  Social History Narrative  . Not on file     Family History:  The patient's ***family history includes Alzheimer's disease in her mother; Bradycardia in her brother; Heart failure in her paternal grandfather and paternal grandmother; Rheumatic fever in her mother; Stomach cancer (age of onset: 44) in her father.   ROS:   Please see the history of present illness.    ROS All other systems reviewed and are negative.   PHYSICAL EXAM:   VS:  There were no vitals taken for this visit.  Physical Exam  GEN: Well nourished, well developed, in no acute distress  HEENT: normal  Neck: no JVD, carotid bruits, or masses Cardiac:RRR; no murmurs, rubs, or gallops  Respiratory:  clear to auscultation bilaterally, normal work of breathing GI: soft, nontender, nondistended, + BS Ext: without cyanosis, clubbing, or edema, Good distal pulses bilaterally MS: no deformity or atrophy  Skin: warm and dry, no rash Neuro:  Alert and Oriented x 3, Strength and sensation are intact Psych: euthymic mood, full affect  Wt Readings from Last 3 Encounters:  09/10/17 240 lb 15.4 oz (109.3 kg)  08/15/17 226 lb (102.5 kg)  06/16/17 205 lb (93 kg)      Studies/Labs Reviewed:   EKG:  EKG is*** ordered today.  The ekg ordered today  demonstrates ***  Recent Labs: 09/02/2017: B Natriuretic Peptide >4,500.0 09/10/2017: ALT 178; BUN 116; BUN 116; Creatinine, Ser 2.56; Creatinine, Ser 2.60; Hemoglobin 11.4; Platelets 196; Potassium 4.9; Potassium 4.9; Sodium 139; Sodium 139   Lipid Panel    Component Value Date/Time   CHOL  02/16/2008 1500    110        ATP III CLASSIFICATION:  <200     mg/dL   Desirable  200-239  mg/dL   Borderline High  >=240    mg/dL   High   TRIG 80 02/16/2008 1500   HDL 42 02/16/2008 1500  CHOLHDL 2.6 02/16/2008 1500   VLDL 16 02/16/2008 1500   LDLCALC  02/16/2008 1500    52        Total Cholesterol/HDL:CHD Risk Coronary Heart Disease Risk Table                     Men   Women  1/2 Average Risk   3.4   3.3    Additional studies/ records that were reviewed today include:  2D echo 09/09/2017 Left ventricle: The cavity size was mildly dilated. Wall   thickness was increased in a pattern of moderate LVH. Systolic   function was severely reduced. The estimated ejection fraction   was = 20%. Diffuse hypokinesis. The study was not technically   sufficient to allow evaluation of LV diastolic dysfunction due to   atrial fibrillation. - Aortic valve: Mildly calcified annulus. Trileaflet; normal   thickness leaflets. Valve area (VTI): 2.39 cm^2. Valve area   (Vmax): 2.18 cm^2. - Mitral valve: There was mild regurgitation. - Left atrium: The atrium was severely dilated. - Right ventricle: The cavity size was mildly dilated. Systolic   function was mildly to moderately reduced. - Right atrium: The atrium was moderately dilated. - Atrial septum: No defect or patent foramen ovale was identified. - Pulmonary arteries: Systolic pressure was mildly increased. PA   peak pressure: 37 mm Hg (S).         ASSESSMENT:    No diagnosis found.   PLAN:  In order of problems listed above:      Medication Adjustments/Labs and Tests Ordered: Current medicines are reviewed at length with the  patient today.  Concerns regarding medicines are outlined above.  Medication changes, Labs and Tests ordered today are listed in the Patient Instructions below. There are no Patient Instructions on file for this visit.   Sumner Boast, PA-C  09/10/2017 3:26 PM    [facility] [address], [place], [place]  [address] Phone: [phone]; Fax: [phone]

## 2017-09-10 NOTE — Progress Notes (Signed)
PT Cancellation Note  Patient Details Name: Gina [name] MRN: [id number] DOB: Oct 25, 1940   Cancelled Treatment:    Reason Eval/Treat Not Completed: Medical issues which prohibited therapy.  Patient transferred to a higher level of care and will need new PT consult resume therapy when patient is medically stable.  Thank you.   7:35 AM, 09/10/17 Lonell Grandchild, MPT Physical Therapist with [facility] [phone] office [phone] mobile phone

## 2017-09-10 NOTE — Progress Notes (Addendum)
Lab issues with getting coox panel resulted into epic. By verbal report O2saturation is 41.4%, this is on 2 mcg/kg/min of dobutamine. Heart rates 80 to 90s, we will increase dobutamine to 4 mcg/kg/min. We will also stop eliquis, start hep gtt in case invasive procedures become indicated.    Carlyle Dolly MD

## 2017-09-11 ENCOUNTER — Encounter (HOSPITAL_COMMUNITY): Payer: Self-pay

## 2017-09-11 ENCOUNTER — Inpatient Hospital Stay (HOSPITAL_COMMUNITY): Payer: Medicare Other | Admitting: Anesthesiology

## 2017-09-11 ENCOUNTER — Encounter (HOSPITAL_COMMUNITY)
Admission: EM | Disposition: A | Discharge status: Skilled Nursing Facility | Payer: Self-pay | Source: Home / Self Care | Attending: Cardiology

## 2017-09-11 DIAGNOSIS — I481 Persistent atrial fibrillation: Secondary | ICD-10-CM

## 2017-09-11 DIAGNOSIS — I4891 Unspecified atrial fibrillation: Secondary | ICD-10-CM

## 2017-09-11 HISTORY — PX: CARDIOVERSION: SHX1299

## 2017-09-11 LAB — CBC
HCT: 36.9 % (ref 36.0–46.0)
Hemoglobin: 11.1 g/dL — ABNORMAL LOW (ref 12.0–15.0)
MCH: 30.2 pg (ref 26.0–34.0)
MCHC: 30.1 g/dL (ref 30.0–36.0)
MCV: 100.3 fL — ABNORMAL HIGH (ref 78.0–100.0)
Platelets: 180 K/uL (ref 150–400)
RBC: 3.68 MIL/uL — ABNORMAL LOW (ref 3.87–5.11)
RDW: 18.8 % — ABNORMAL HIGH (ref 11.5–15.5)
WBC: 4.9 K/uL (ref 4.0–10.5)

## 2017-09-11 LAB — PROTEIN ELECTROPHORESIS, SERUM
A/G Ratio: 1.3 (ref 0.7–1.7)
Albumin ELP: 2.8 g/dL — ABNORMAL LOW (ref 2.9–4.4)
Alpha-1-Globulin: 0.2 g/dL (ref 0.0–0.4)
Alpha-2-Globulin: 0.6 g/dL (ref 0.4–1.0)
Beta Globulin: 0.6 g/dL — ABNORMAL LOW (ref 0.7–1.3)
Gamma Globulin: 0.6 g/dL (ref 0.4–1.8)
Globulin, Total: 2.1 g/dL — ABNORMAL LOW (ref 2.2–3.9)
Total Protein ELP: 4.9 g/dL — ABNORMAL LOW (ref 6.0–8.5)

## 2017-09-11 LAB — GLUCOSE, CAPILLARY: Glucose-Capillary: 98 mg/dL (ref 70–99)

## 2017-09-11 LAB — BASIC METABOLIC PANEL WITH GFR
Anion gap: 13 (ref 5–15)
BUN: 108 mg/dL — ABNORMAL HIGH (ref 8–23)
CO2: 27 mmol/L (ref 22–32)
Calcium: 8.2 mg/dL — ABNORMAL LOW (ref 8.9–10.3)
Chloride: 97 mmol/L — ABNORMAL LOW (ref 98–111)
Creatinine, Ser: 2.9 mg/dL — ABNORMAL HIGH (ref 0.44–1.00)
GFR calc Af Amer: 17 mL/min — ABNORMAL LOW
GFR calc non Af Amer: 15 mL/min — ABNORMAL LOW
Glucose, Bld: 222 mg/dL — ABNORMAL HIGH (ref 70–99)
Potassium: 4.2 mmol/L (ref 3.5–5.1)
Sodium: 137 mmol/L (ref 135–145)

## 2017-09-11 LAB — COOXEMETRY PANEL
Carboxyhemoglobin: 1.6 % — ABNORMAL HIGH (ref 0.5–1.5)
Methemoglobin: 1.4 % (ref 0.0–1.5)
O2 Saturation: 59.2 %
Total hemoglobin: 11.1 g/dL — ABNORMAL LOW (ref 12.0–16.0)

## 2017-09-11 SURGERY — CARDIOVERSION
Anesthesia: General

## 2017-09-11 MED ORDER — NOREPINEPHRINE 4 MG/250ML-% IV SOLN: 2.0000 ug/min | INTRAVENOUS | Status: DC

## 2017-09-11 MED ORDER — AMIODARONE HCL 200 MG PO TABS
200.0000 mg | ORAL_TABLET | Freq: Two times a day (BID) | ORAL | Status: DC
  Administered 2017-09-11: 200 mg via ORAL
  Filled 2017-09-11 (×2): qty 1

## 2017-09-11 MED ORDER — SODIUM CHLORIDE 0.9 % IV SOLN: 250.0000 mL | INTRAVENOUS | Status: DC

## 2017-09-11 MED ORDER — POTASSIUM CHLORIDE CRYS ER 20 MEQ PO TBCR
20.0000 meq | EXTENDED_RELEASE_TABLET | Freq: Every day | ORAL | Status: DC
  Administered 2017-09-11 – 2017-09-14 (×4): 20 meq via ORAL
  Filled 2017-09-11 (×5): qty 1

## 2017-09-11 MED ORDER — SODIUM CHLORIDE 0.9 % IV SOLN
INTRAVENOUS | Status: DC
  Administered 2017-09-11: 14:00:00 via INTRAVENOUS

## 2017-09-11 MED ORDER — SODIUM CHLORIDE 0.9% FLUSH
3.0000 mL | Freq: Two times a day (BID) | INTRAVENOUS | Status: DC
  Administered 2017-09-11 – 2017-09-12 (×2): 10 mL via INTRAVENOUS
  Administered 2017-09-12: 3 mL via INTRAVENOUS

## 2017-09-11 MED ORDER — PROPOFOL 10 MG/ML IV BOLUS
INTRAVENOUS | Status: DC | PRN
  Administered 2017-09-11: 40 mg via INTRAVENOUS
  Administered 2017-09-11 (×2): 20 mg via INTRAVENOUS

## 2017-09-11 MED ORDER — SODIUM CHLORIDE 0.9% FLUSH: 3.0000 mL | INTRAVENOUS | Status: DC | PRN

## 2017-09-11 MED ORDER — LIDOCAINE 2% (20 MG/ML) 5 ML SYRINGE
INTRAMUSCULAR | Status: DC | PRN
  Administered 2017-09-11: 80 mg via INTRAVENOUS

## 2017-09-11 MED ORDER — NOREPINEPHRINE 4 MG/250ML-% IV SOLN
2.0000 ug/min | INTRAVENOUS | Status: DC
  Administered 2017-09-11: 3 ug/min via INTRAVENOUS
  Administered 2017-09-11 – 2017-09-13 (×2): 2 ug/min via INTRAVENOUS
  Filled 2017-09-11 (×4): qty 250

## 2017-09-11 NOTE — Progress Notes (Signed)
Pt went back into a-fib. HR between 100-120, BP 114/72. Cards paged. No new orders given. Stated to titrate Levo down.

## 2017-09-11 NOTE — Interval H&P Note (Signed)
History and Physical Interval Note:  09/11/2017 2:50 PM  Gina [name]  has presented today for surgery, with the diagnosis of a fib  The various methods of treatment have been discussed with the patient and family. After consideration of risks, benefits and other options for treatment, the patient has consented to  Procedure(s): CARDIOVERSION (N/A) as a surgical intervention .  The patient's history has been reviewed, patient examined, no change in status, stable for surgery.  I have reviewed the patient's chart and labs.  Questions were answered to the patient's satisfaction.     Suleyma Wafer [employer]

## 2017-09-11 NOTE — Procedures (Signed)
Electrical Cardioversion Procedure Note Gina [name] [id number] [birth year]  Procedure: Electrical Cardioversion Indications:  Atrial Fibrillation  Procedure Details Consent: Risks of procedure as well as the alternatives and risks of each were explained to the (patient/caregiver).  Consent for procedure obtained. Time Out: Verified patient identification, verified procedure, site/side was marked, verified correct patient position, special equipment/implants available, medications/allergies/relevent history reviewed, required imaging and test results available.  Performed  Patient placed on cardiac monitor, pulse oximetry, supplemental oxygen as necessary.  Sedation given: Propofol per anesthesiology Pacer pads placed anterior and posterior chest.  Cardioverted 1 time(s).  Cardioverted at [place].  Evaluation Findings: Post procedure EKG shows: NSR/sinus brady upper 10Y Complications: None Patient did tolerate procedure well.   [name] 09/11/2017, 2:59 PM

## 2017-09-11 NOTE — H&P (View-Only) (Signed)
Advanced Heart Failure Rounding Note  PCP-Cardiologist: Carlyle Dolly, MD   Subjective:    Transferred to [facility] 7/10.   Yesterday dobutamine stopped and milrinone 0.25 mcg started. CO-OX improving 40%>59%. Diuresing with IV lasix. Weight down 1 pound.   Urine CX pending.   Feeling a little better. Denies SOB.    Objective:   Weight Range: 239 lb 9.6 oz (108.7 kg) Body mass index is 41.13 kg/m.   Vital Signs:   Temp:  [97 F (36.1 C)-97.7 F (36.5 C)] 97.7 F (36.5 C) (07/11 0400) Pulse Rate:  [85-119] 98 (07/11 0400) Resp:  [14-43] 15 (07/11 0200) BP: (77-120)/(57-98) 77/57 (07/11 0700) SpO2:  [84 %-100 %] 97 % (07/11 0700) Weight:  [239 lb 9.6 oz (108.7 kg)] 239 lb 9.6 oz (108.7 kg) (07/11 0600) Last BM Date: 09/10/17  Weight change: Filed Weights   09/09/17 1245 09/10/17 0500 09/11/17 0600  Weight: 234 lb 9.1 oz (106.4 kg) 240 lb 15.4 oz (109.3 kg) 239 lb 9.6 oz (108.7 kg)    Intake/Output:   Intake/Output Summary (Last 24 hours) at 09/11/2017 0726 Last data filed at 09/11/2017 0200 Gross per 24 hour  Intake 621.68 ml  Output 510 ml  Net 111.68 ml      Physical Exam   CVP 10-11 Personally checked.  General:   No resp difficulty. In bed.  HEENT: Normal Neck: Supple. JVP 10-11. Carotids 2+ bilat; no bruits. No lymphadenopathy or thyromegaly appreciated. Cor: PMI nondisplaced. Irregular rate & rhythm. No rubs, gallops or murmurs. Lungs: Clear on 2 liters.  Abdomen: Soft, nontender, nondistended. No hepatosplenomegaly. No bruits or masses. Good bowel sounds. Extremities: No cyanosis, clubbing, rash, R and LLE 3+ edema. LLE warm/red Neuro: Alert & orientedx3, cranial nerves grossly intact. moves all 4 extremities w/o difficulty. Affect pleasant   Telemetry   A fib 110-120s   EKG    n/a  Labs    CBC Recent Labs    09/10/17 1340 09/11/17 0420  WBC 5.6 4.9  HGB 11.4* 11.1*  HCT 38.3 36.9  MCV 100.8* 100.3*  PLT 196 338   Basic Metabolic  Panel Recent Labs    09/09/17 0616 09/10/17 0506 09/11/17 0420  NA 140 139  139 137  K 4.2 4.9  4.9 4.2  CL 105 101  102 97*  CO2 24 24  24 27   GLUCOSE 113* 127*  128* 222*  BUN 101* 116*  116* 108*  CREATININE 2.19* 2.56*  2.60* 2.90*  CALCIUM 8.5* 8.8*  8.8* 8.2*  PHOS 5.1* 6.3*  --    Liver Function Tests Recent Labs    09/09/17 0616 09/10/17 0506  AST  --  150*  ALT  --  178*  ALKPHOS  --  57  BILITOT  --  1.1  PROT  --  5.9*  ALBUMIN 3.0* 3.1*  3.1*   No results for input(s): LIPASE, AMYLASE in the last 72 hours. Cardiac Enzymes No results for input(s): CKTOTAL, CKMB, CKMBINDEX, TROPONINI in the last 72 hours.  BNP: BNP (last 3 results) Recent Labs    09/02/17 1512  BNP >4,500.0*    ProBNP (last 3 results) No results for input(s): PROBNP in the last 8760 hours.   D-Dimer No results for input(s): DDIMER in the last 72 hours. Hemoglobin A1C No results for input(s): HGBA1C in the last 72 hours. Fasting Lipid Panel No results for input(s): CHOL, HDL, LDLCALC, TRIG, CHOLHDL, LDLDIRECT in the last 72 hours. Thyroid Function Tests No results  for input(s): TSH, T4TOTAL, T3FREE, THYROIDAB in the last 72 hours.  Invalid input(s): FREET3  Other results:   Imaging     No results found.   Medications:     Scheduled Medications: . apixaban  5 mg Oral BID  . Chlorhexidine Gluconate Cloth  6 each Topical Daily  . furosemide  80 mg Intravenous BID  . sodium chloride flush  10-40 mL Intracatheter Q12H  . sodium chloride flush  3 mL Intravenous Q12H     Infusions: . sodium chloride Stopped (09/10/17 1748)  . amiodarone 30 mg/hr (09/11/17 0649)  . cefTRIAXone (ROCEPHIN)  IV 1 g (09/10/17 2107)  . milrinone 0.25 mcg/kg/min (09/11/17 0200)     PRN Medications:  sodium chloride, acetaminophen, bisacodyl, ondansetron (ZOFRAN) IV, sodium chloride flush, sodium chloride flush, traMADol    Patient Profile   Gina [name] is a 77 year old with  a history chronic systolic heart failure, anemia, htn, LBBB, renal artery stenosis, and OSA.   Transferred to [facility] on 7/10 with cardiogenic shock, Afib RVR, and volume overload.    Assessment/Plan  1. Cardiogenic Shock  EF has been low 20-25% over 4 years. NICM cath 2009 normal cors.  ECHO 09/09/2017 EF 20% RV mildly dilated . Check SPEP/UPEP.  -CO-OX 40% on dobutamine 2 mcg. She was switched to milrinone 0.25 mcg. CO-OX 59%.    -CVP 10-11. Continue  80 mg IV lasix twice a day.   - Will need foley cath. Strict I/Os  -No bb with shock. Last dose  Toprol xl stopped 09/09/2017  -No arb/sprio/dig with elevated creatinine -Follow renal function closely  2. A fib RVR -On amio drip + apixaban.  -Set up DC-CV tomorrow.   3. AKI Worsening creatinine 2.4>2.56>2.9 .  Renal US 09/25/2016 negative. She does not take NSAIDs.   4. LBBB QRS 200 Gina on EKG  She is open to CRT-D.  5. Transaminitis Elevated AST/ALT likely in the setting of cardiogenic shock. Follow daily. Hopefully will come down with inotropes.   6. OSA Continue CPAP at night.   7. UTI UA-  Leukocytes. Continue rocephin  Urine CX pending.   8. LLE Cellulitis Continue rocephin. Discussed with pharmacy.    Length of Stay: [place], NP  09/11/2017, 7:26 AM  Advanced Heart Failure Team Pager [phone] (M-F; 7a - 4p)  Please contact [facility] for night-coverage after hours (4p -7a ) and weekends on [email or web address]  Patient seen with NP, agree with the above note.  She feels better on milrinone 0.25 and co-ox is up to 59% from 41%.  However, SBP has been soft.  She remains in atrial fibrillation, rate in 100s on amiodarone gtt.  Creatinine up to 2.9.  UOP not vigorous.  CVP today 10-11 which is better.  On exam, JVP 14 cm, irregular S1S2, legs wrapped with 2+ edema to knees.   1. Acute on chronic systolic CHF: Echo (7/62) with EF 20%.  Long-standing cardiomyopathy.  Nonischemic based on cath in 2017.  She had low  output HF with co-ox 41%, CVP 17, AKI on CKD stage 3 ("cold and wet").  Exacerbation triggered by atrial fibrillation and also by being off Lasix for a number of days after hospitalization at [facility] for UTI.  Today, co-ox up to 59% on milrinone 0.25 but creatinine up to 2.9.  CVP around 11.  SBP soft in 80s-90s generally.   - Continue milrinone 0.25, and with low BP will add norepinephrine 2.   - Lasix  80 mg IV bid to continue.  - No ACEI/ARB/ARNI/digoxin/spironolactone with AKI.  - No beta blocker with low output.  - Has wide LBBB, ultimately needs CRT.  - Needs conversion to NSR.  2. AKI on CKD stage 3: Suspect cardiorenal syndrome with low output HF.  No NSAID use.   - On milrinone to improve cardiac output and will add norepinephrine to stabilize BP.  - Diuresis with fall in renal venous pressure may help as well.  3. Atrial fibrillation: Admitted with afib/RVR, has been in afib for about 6 wks.  Has been on Eliquis for about 4 weeks without missing any doses. Rate is controlled currently on amiodarone gtt.  - Continue Eliquis.  - Amiodarone gtt for rate control.  - Plan DCCV today to see if this helps to stabilize hemodynamics. We discussed risks/benefits of procedure and she agrees.  4. Elevated LFTs: Suspect shock liver with low cardiac output.  Follow CMET.   CRITICAL CARE Performed by: [name]  Total critical care time: 35 minutes  Critical care time was exclusive of separately billable procedures and treating other patients.  Critical care was necessary to treat or prevent imminent or life-threatening deterioration.  Critical care was time spent personally by me on the following activities: development of treatment plan with patient and/or surrogate as well as nursing, discussions with consultants, evaluation of patient's response to treatment, examination of patient, obtaining history from patient or surrogate, ordering and performing treatments and interventions,  ordering and review of laboratory studies, ordering and review of radiographic studies, pulse oximetry and re-evaluation of patient's condition.  [name] 09/11/2017 8:34 AM

## 2017-09-11 NOTE — Plan of Care (Signed)
  Problem: Nutrition: Goal: Adequate nutrition will be maintained Outcome: Progressing   Problem: Pain Managment: Goal: General experience of comfort will improve Outcome: Progressing   Problem: Safety: Goal: Ability to remain free from injury will improve Outcome: Progressing   Problem: Activity: Goal: Risk for activity intolerance will decrease Outcome: Not Progressing   Problem: Elimination: Goal: Will not experience complications related to urinary retention Outcome: Not Progressing   Problem: Skin Integrity: Goal: Risk for impaired skin integrity will decrease Outcome: Not Progressing   Problem: Activity: Goal: Capacity to carry out activities will improve Outcome: Not Progressing   Problem: Coping: Goal: Level of anxiety will decrease Outcome: Completed/Met   Problem: Elimination: Goal: Will not experience complications related to bowel motility Outcome: Completed/Met   Problem: Cardiac: Goal: Ability to achieve and maintain adequate cardiopulmonary perfusion will improve Outcome: Completed/Met   Problem: Coping: Goal: Level of anxiety will decrease Outcome: Completed/Met   Problem: Elimination: Goal: Will not experience complications related to bowel motility Outcome: Completed/Met   Problem: Cardiac: Goal: Ability to achieve and maintain adequate cardiopulmonary perfusion will improve Outcome: Completed/Met

## 2017-09-11 NOTE — Anesthesia Preprocedure Evaluation (Addendum)
Anesthesia Evaluation  Patient identified by MRN, date of birth, ID band Patient awake    Reviewed: Allergy & Precautions, NPO status , Patient's Chart, lab work & pertinent test results  Airway Mallampati: II  TM Distance: >3 FB Neck ROM: Full    Dental no notable dental hx.    Pulmonary sleep apnea and Oxygen sleep apnea , former smoker,    Pulmonary exam normal breath sounds clear to auscultation       Cardiovascular hypertension, Pt. on home beta blockers + CAD and +CHF  Normal cardiovascular exam+ Valvular Problems/Murmurs MR  Rhythm:Regular Rate:Normal  ECG: A-fib, LBBB, LAD  ECHO: LV EF: 20%   Neuro/Psych negative neurological ROS  negative psych ROS   GI/Hepatic negative GI ROS, Neg liver ROS,   Endo/Other  Morbid obesity  Renal/GU CRFRenal disease     Musculoskeletal negative musculoskeletal ROS (+)   Abdominal (+) + obese,   Peds  Hematology  (+) anemia ,   Anesthesia Other Findings a fib with rvr  Reproductive/Obstetrics                            Anesthesia Physical Anesthesia Plan  ASA: IV  Anesthesia Plan: General   Post-op Pain Management:    Induction: Intravenous  PONV Risk Score and Plan: 3 and Propofol infusion and Treatment may vary due to age or medical condition  Airway Management Planned: Mask  Additional Equipment:   Intra-op Plan:   Post-operative Plan:   Informed Consent: I have reviewed the patients History and Physical, chart, labs and discussed the procedure including the risks, benefits and alternatives for the proposed anesthesia with the patient or authorized representative who has indicated his/her understanding and acceptance.   Dental advisory given  Plan Discussed with: CRNA  Anesthesia Plan Comments:         Anesthesia Quick Evaluation

## 2017-09-11 NOTE — Progress Notes (Signed)
Pt returns from Endo in SB HR of 40s.  Patient is resting comfortably. Per Dr. Aundra Dubin titrate Levo down to a SBP goal of 90.

## 2017-09-11 NOTE — Anesthesia Postprocedure Evaluation (Signed)
Anesthesia Post Note  Patient: Gina [name]  Procedure(s) Performed: CARDIOVERSION (N/A )     Patient location during evaluation: PACU Anesthesia Type: General Level of consciousness: awake Pain management: pain level controlled Vital Signs Assessment: post-procedure vital signs reviewed and stable Respiratory status: spontaneous breathing, nonlabored ventilation, respiratory function stable and patient connected to nasal cannula oxygen Cardiovascular status: blood pressure returned to baseline and stable Postop Assessment: no apparent nausea or vomiting Anesthetic complications: no    Last Vitals:  Vitals:   09/11/17 1630 09/11/17 1641  BP: 116/68   Pulse: (!) 57   Resp: 18   Temp:  36.6 C  SpO2: 100%     Last Pain:  Vitals:   09/11/17 1641  TempSrc: Oral  PainSc:                  Sacha Radloff P Amyah Clawson

## 2017-09-11 NOTE — Transfer of Care (Signed)
Immediate Anesthesia Transfer of Care Note  Patient: Gina [name]  Procedure(s) Performed: CARDIOVERSION (N/A )  Patient Location: Endoscopy Unit  Anesthesia Type:MAC  Level of Consciousness: awake and oriented  Airway & Oxygen Therapy: Patient Spontanous Breathing and Patient connected to nasal cannula oxygen  Post-op Assessment: Report given to RN  Post vital signs: Reviewed and stable  Last Vitals:  Vitals Value Taken Time  BP    Temp    Pulse    Resp    SpO2      Last Pain:  Vitals:   09/11/17 1330  TempSrc:   PainSc: 0-No pain      Patients Stated Pain Goal: 3 (51/89/84 2103)  Complications: No apparent anesthesia complications

## 2017-09-11 NOTE — Progress Notes (Addendum)
Advanced Heart Failure Rounding Note  PCP-Cardiologist: Gina Dolly, MD   Subjective:    Transferred to [facility] 7/10.   Yesterday dobutamine stopped and milrinone 0.25 mcg started. CO-OX improving 40%>59%. Diuresing with IV lasix. Weight down 1 pound.   Urine CX pending.   Feeling a little better. Denies SOB.    Objective:   Weight Range: 239 lb 9.6 oz (108.7 kg) Body mass index is 41.13 kg/m.   Vital Signs:   Temp:  [97 F (36.1 C)-97.7 F (36.5 C)] 97.7 F (36.5 C) (07/11 0400) Pulse Rate:  [85-119] 98 (07/11 0400) Resp:  [14-43] 15 (07/11 0200) BP: (77-120)/(57-98) 77/57 (07/11 0700) SpO2:  [84 %-100 %] 97 % (07/11 0700) Weight:  [239 lb 9.6 oz (108.7 kg)] 239 lb 9.6 oz (108.7 kg) (07/11 0600) Last BM Date: 09/10/17  Weight change: Filed Weights   09/09/17 1245 09/10/17 0500 09/11/17 0600  Weight: 234 lb 9.1 oz (106.4 kg) 240 lb 15.4 oz (109.3 kg) 239 lb 9.6 oz (108.7 kg)    Intake/Output:   Intake/Output Summary (Last 24 hours) at 09/11/2017 0726 Last data filed at 09/11/2017 0200 Gross per 24 hour  Intake 621.68 ml  Output 510 ml  Net 111.68 ml      Physical Exam   CVP 10-11 Personally checked.  General:   No resp difficulty. In bed.  HEENT: Normal Neck: Supple. JVP 10-11. Carotids 2+ bilat; no bruits. No lymphadenopathy or thyromegaly appreciated. Cor: PMI nondisplaced. Irregular rate & rhythm. No rubs, gallops or murmurs. Lungs: Clear on 2 liters.  Abdomen: Soft, nontender, nondistended. No hepatosplenomegaly. No bruits or masses. Good bowel sounds. Extremities: No cyanosis, clubbing, rash, R and LLE 3+ edema. LLE warm/red Neuro: Alert & orientedx3, cranial nerves grossly intact. moves all 4 extremities w/o difficulty. Affect pleasant   Telemetry   A fib 110-120s   EKG    n/a  Labs    CBC Recent Labs    09/10/17 1340 09/11/17 0420  WBC 5.6 4.9  HGB 11.4* 11.1*  HCT 38.3 36.9  MCV 100.8* 100.3*  PLT 196 546   Basic Metabolic  Panel Recent Labs    09/09/17 0616 09/10/17 0506 09/11/17 0420  NA 140 139  139 137  K 4.2 4.9  4.9 4.2  CL 105 101  102 97*  CO2 24 24  24 27   GLUCOSE 113* 127*  128* 222*  BUN 101* 116*  116* 108*  CREATININE 2.19* 2.56*  2.60* 2.90*  CALCIUM 8.5* 8.8*  8.8* 8.2*  PHOS 5.1* 6.3*  --    Liver Function Tests Recent Labs    09/09/17 0616 09/10/17 0506  AST  --  150*  ALT  --  178*  ALKPHOS  --  57  BILITOT  --  1.1  PROT  --  5.9*  ALBUMIN 3.0* 3.1*  3.1*   No results for input(s): LIPASE, AMYLASE in the last 72 hours. Cardiac Enzymes No results for input(s): CKTOTAL, CKMB, CKMBINDEX, TROPONINI in the last 72 hours.  BNP: BNP (last 3 results) Recent Labs    09/02/17 1512  BNP >4,500.0*    ProBNP (last 3 results) No results for input(s): PROBNP in the last 8760 hours.   D-Dimer No results for input(s): DDIMER in the last 72 hours. Hemoglobin A1C No results for input(s): HGBA1C in the last 72 hours. Fasting Lipid Panel No results for input(s): CHOL, HDL, LDLCALC, TRIG, CHOLHDL, LDLDIRECT in the last 72 hours. Thyroid Function Tests No results  for input(s): TSH, T4TOTAL, T3FREE, THYROIDAB in the last 72 hours.  Invalid input(s): FREET3  Other results:   Imaging     No results found.   Medications:     Scheduled Medications: . apixaban  5 mg Oral BID  . Chlorhexidine Gluconate Cloth  6 each Topical Daily  . furosemide  80 mg Intravenous BID  . sodium chloride flush  10-40 mL Intracatheter Q12H  . sodium chloride flush  3 mL Intravenous Q12H     Infusions: . sodium chloride Stopped (09/10/17 1748)  . amiodarone 30 mg/hr (09/11/17 0649)  . cefTRIAXone (ROCEPHIN)  IV 1 g (09/10/17 2107)  . milrinone 0.25 mcg/kg/min (09/11/17 0200)     PRN Medications:  sodium chloride, acetaminophen, bisacodyl, ondansetron (ZOFRAN) IV, sodium chloride flush, sodium chloride flush, traMADol    Patient Profile   Gina [name] is a 77 year old with  a history chronic systolic heart failure, anemia, htn, LBBB, renal artery stenosis, and OSA.   Transferred to [facility] on 7/10 with cardiogenic shock, Afib RVR, and volume overload.    Assessment/Plan  1. Cardiogenic Shock  EF has been low 20-25% over 4 years. NICM cath 2009 normal cors.  ECHO 09/09/2017 EF 20% RV mildly dilated . Check SPEP/UPEP.  -CO-OX 40% on dobutamine 2 mcg. She was switched to milrinone 0.25 mcg. CO-OX 59%.    -CVP 10-11. Continue  80 mg IV lasix twice a day.   - Will need foley cath. Strict I/Os  -No bb with shock. Last dose  Toprol xl stopped 09/09/2017  -No arb/sprio/dig with elevated creatinine -Follow renal function closely  2. A fib RVR -On amio drip + apixaban.  -Set up DC-CV tomorrow.   3. AKI Worsening creatinine 2.4>2.56>2.9 .  Renal US 09/25/2016 negative. She does not take NSAIDs.   4. LBBB QRS 200 Gina on EKG  She is open to CRT-D.  5. Transaminitis Elevated AST/ALT likely in the setting of cardiogenic shock. Follow daily. Hopefully will come down with inotropes.   6. OSA Continue CPAP at night.   7. UTI UA-  Leukocytes. Continue rocephin  Urine CX pending.   8. LLE Cellulitis Continue rocephin. Discussed with pharmacy.    Length of Stay: [place], NP  09/11/2017, 7:26 AM  Advanced Heart Failure Team Pager [phone] (M-F; 7a - 4p)  Please contact [facility] for night-coverage after hours (4p -7a ) and weekends on [email or web address]  Patient seen with NP, agree with the above note.  She feels better on milrinone 0.25 and co-ox is up to 59% from 41%.  However, SBP has been soft.  She remains in atrial fibrillation, rate in 100s on amiodarone gtt.  Creatinine up to 2.9.  UOP not vigorous.  CVP today 10-11 which is better.  On exam, JVP 14 cm, irregular S1S2, legs wrapped with 2+ edema to knees.   1. Acute on chronic systolic CHF: Echo (5/28) with EF 20%.  Long-standing cardiomyopathy.  Nonischemic based on cath in 2017.  She had low  output HF with co-ox 41%, CVP 17, AKI on CKD stage 3 ("cold and wet").  Exacerbation triggered by atrial fibrillation and also by being off Lasix for a number of days after hospitalization at [facility] for UTI.  Today, co-ox up to 59% on milrinone 0.25 but creatinine up to 2.9.  CVP around 11.  SBP soft in 80s-90s generally.   - Continue milrinone 0.25, and with low BP will add norepinephrine 2.   - Lasix  80 mg IV bid to continue.  - No ACEI/ARB/ARNI/digoxin/spironolactone with AKI.  - No beta blocker with low output.  - Has wide LBBB, ultimately needs CRT.  - Needs conversion to NSR.  2. AKI on CKD stage 3: Suspect cardiorenal syndrome with low output HF.  No NSAID use.   - On milrinone to improve cardiac output and will add norepinephrine to stabilize BP.  - Diuresis with fall in renal venous pressure may help as well.  3. Atrial fibrillation: Admitted with afib/RVR, has been in afib for about 6 wks.  Has been on Eliquis for about 4 weeks without missing any doses. Rate is controlled currently on amiodarone gtt.  - Continue Eliquis.  - Amiodarone gtt for rate control.  - Plan DCCV today to see if this helps to stabilize hemodynamics. We discussed risks/benefits of procedure and she agrees.  4. Elevated LFTs: Suspect shock liver with low cardiac output.  Follow CMET.   CRITICAL CARE Performed by: [name]  Total critical care time: 35 minutes  Critical care time was exclusive of separately billable procedures and treating other patients.  Critical care was necessary to treat or prevent imminent or life-threatening deterioration.  Critical care was time spent personally by me on the following activities: development of treatment plan with patient and/or surrogate as well as nursing, discussions with consultants, evaluation of patient's response to treatment, examination of patient, obtaining history from patient or surrogate, ordering and performing treatments and interventions,  ordering and review of laboratory studies, ordering and review of radiographic studies, pulse oximetry and re-evaluation of patient's condition.  [name] 09/11/2017 8:34 AM

## 2017-09-12 LAB — CBC
HCT: 39.6 % (ref 36.0–46.0)
Hemoglobin: 11.8 g/dL — ABNORMAL LOW (ref 12.0–15.0)
MCH: 29.7 pg (ref 26.0–34.0)
MCHC: 29.8 g/dL — ABNORMAL LOW (ref 30.0–36.0)
MCV: 99.7 fL (ref 78.0–100.0)
Platelets: 253 10*3/uL (ref 150–400)
RBC: 3.97 MIL/uL (ref 3.87–5.11)
RDW: 18.6 % — ABNORMAL HIGH (ref 11.5–15.5)
WBC: 5.8 10*3/uL (ref 4.0–10.5)

## 2017-09-12 LAB — COMPREHENSIVE METABOLIC PANEL
ALT: 114 U/L — ABNORMAL HIGH (ref 0–44)
AST: 38 U/L (ref 15–41)
Albumin: 2.6 g/dL — ABNORMAL LOW (ref 3.5–5.0)
Alkaline Phosphatase: 51 U/L (ref 38–126)
Anion gap: 15 (ref 5–15)
BUN: 104 mg/dL — ABNORMAL HIGH (ref 8–23)
CO2: 26 mmol/L (ref 22–32)
Calcium: 8.3 mg/dL — ABNORMAL LOW (ref 8.9–10.3)
Chloride: 99 mmol/L (ref 98–111)
Creatinine, Ser: 2.77 mg/dL — ABNORMAL HIGH (ref 0.44–1.00)
GFR calc Af Amer: 18 mL/min — ABNORMAL LOW (ref >60)
GFR calc non Af Amer: 16 mL/min — ABNORMAL LOW (ref >60)
Glucose, Bld: 144 mg/dL — ABNORMAL HIGH (ref 70–99)
Potassium: 3.8 mmol/L (ref 3.5–5.1)
Sodium: 140 mmol/L (ref 135–145)
Total Bilirubin: 0.8 mg/dL (ref 0.3–1.2)
Total Protein: 4.9 g/dL — ABNORMAL LOW (ref 6.5–8.1)

## 2017-09-12 LAB — UPEP/UIFE/LIGHT CHAINS/TP, 24-HR UR
% BETA, Urine: 43.3 %
ALPHA 1 URINE: 3.1 %
Albumin, U: 28.2 %
Alpha 2, Urine: 8.7 %
Free Kappa Lt Chains,Ur: 67 mg/L — ABNORMAL HIGH (ref 1.35–24.19)
Free Kappa/Lambda Ratio: 16.22 — ABNORMAL HIGH (ref 2.04–10.37)
Free Lambda Lt Chains,Ur: 4.13 mg/L (ref 0.24–6.66)
GAMMA GLOBULIN URINE: 16.6 %
Total Protein, Urine-Ur/day: 342 mg/24 hr — ABNORMAL HIGH (ref 30–150)
Total Protein, Urine: 20.1 mg/dL
Total Volume: 1700

## 2017-09-12 LAB — COOXEMETRY PANEL
Carboxyhemoglobin: 1.6 % — ABNORMAL HIGH (ref 0.5–1.5)
Methemoglobin: 1.3 % (ref 0.0–1.5)
O2 Saturation: 71 %
Total hemoglobin: 12 g/dL (ref 12.0–16.0)

## 2017-09-12 MED ORDER — AMIODARONE HCL IN DEXTROSE 360-4.14 MG/200ML-% IV SOLN
30.0000 mg/h | INTRAVENOUS | Status: DC
  Administered 2017-09-12 – 2017-09-15 (×8): 30 mg/h via INTRAVENOUS
  Filled 2017-09-12 (×9): qty 200

## 2017-09-12 MED ORDER — TORSEMIDE 20 MG PO TABS
20.0000 mg | ORAL_TABLET | Freq: Every day | ORAL | Status: DC
  Administered 2017-09-12: 20 mg via ORAL
  Filled 2017-09-12: qty 1

## 2017-09-12 MED ORDER — AMIODARONE HCL IN DEXTROSE 360-4.14 MG/200ML-% IV SOLN
60.0000 mg/h | INTRAVENOUS | Status: AC
  Administered 2017-09-12: 60 mg/h via INTRAVENOUS

## 2017-09-12 NOTE — Clinical Social Work Note (Signed)
Clinical Social Work Assessment  Patient Details  Name: Gina [name] MRN: [id number] Date of Birth: 24-Aug-1940  Date of referral:  09/12/17               Reason for consult:  Facility Placement                Permission sought to share information with:  Facility Art therapist granted to share information::     Name::        Agency::  SNFs  Relationship::     Contact Information:     Housing/Transportation Living arrangements for the past 2 months:  Single Family Home Source of Information:  Patient Patient Interpreter Needed:  None Criminal Activity/Legal Involvement Pertinent to Current Situation/Hospitalization:  No - Comment as needed Significant Relationships:  Spouse, Siblings Lives with:  Spouse(Spouse with dementia) Do you feel safe going back to the place where you live?  Yes Need for family participation in patient care:  No (Coment)  Care giving concerns:  CSW received consult for possible SNF placement at time of discharge. CSW spoke with patient regarding PT recommendation of SNF placement at time of discharge. Patient reported that patient's spouse is currently unable to care for patient at their home given patient's current physical needs and fall risk. Patient reports that her spouse has dementia. Patient expressed understanding of PT recommendation and is agreeable to SNF placement at time of discharge, though she would like to get stronger and return home. CSW to continue to follow and assist with discharge planning needs.   Social Worker assessment / plan:  CSW spoke with patient concerning possibility of rehab at [facility] before returning home.  Employment status:  Retired Forensic scientist:  Medicare PT Recommendations:  [place] / Referral to community resources:  [place]  Patient/Family's Response to care:  Patient recognizes need for rehab before returning home and is agreeable to a SNF near  her home in [place] if she [place] unable to walk at discharge. Preferences include [place] and [employer].   Patient/Family's Understanding of and Emotional Response to Diagnosis, Current Treatment, and Prognosis:  Patient/family is realistic regarding therapy needs and expressed being hopeful for SNF placement if she is unable to go home. Patient expressed understanding of CSW role and discharge process as well as medical condition. No questions/concerns about plan or treatment.    Emotional Assessment Appearance:  Appears stated age Attitude/Demeanor/Rapport:  Gracious Affect (typically observed):  Accepting, Appropriate Orientation:  Oriented to Self, Oriented to Place, Oriented to  Time, Oriented to Situation Alcohol / Substance use:  Not Applicable Psych involvement (Current and /or in the community):  No (Comment)  Discharge Needs  Concerns to be addressed:  Care Coordination Readmission within the last 30 days:  No Current discharge risk:  None Barriers to Discharge:  Continued Medical Work up   [employer], [place] 09/12/2017, 12:07 PM

## 2017-09-12 NOTE — NC FL2 (Signed)
[place] LEVEL OF CARE SCREENING TOOL     IDENTIFICATION  Patient Name: Gina [name] Birthdate: 11-Feb-1941 Sex: female Admission Date (Current Location): 09/02/2017  [facility] and [place] Number:  ([place])   Facility and Address:  [facility]. [facility], [place] [address], [place], [place] [address]      Provider Number: [id number]  Attending Physician Name and Address:  Larey Dresser, MD  Relative Name and Phone Number:       Current Level of Care: Hospital Recommended Level of Care: [place] Prior Approval Number:    Date Approved/Denied:   PASRR Number:    Discharge Plan: SNF    Current Diagnoses: Patient Active Problem List   Diagnosis Date Noted  . Acute on chronic systolic heart failure ([place])   . Severe left ventricular systolic dysfunction   . Chronic kidney disease (CKD), stage III (moderate) (HCC)   . CHF exacerbation ([place]) 09/02/2017  . AKI (acute kidney injury) ([place])   . Overweight(278.02) 07/29/2013  . Chronic combined systolic and diastolic CHF (congestive heart failure) ([place]) 12/18/2012  . Coronary artery disease, non-occlusive   . Ejection fraction   . Cardiomyopathy, nonischemic ([place])   . Asymmetric septal hypertrophy (HCC)   . LVH (left ventricular hypertrophy)   . Hypertension   . Mitral regurgitation   . Renal artery stenosis ([place])   . Anemia   . LBBB (left bundle branch block)   . OSA (obstructive sleep apnea)   . Bradycardia   . Atrial fibrillation ([place])   . HYPOKALEMIA 02/14/2010  . Aortic regurgitation 11/21/2008    Orientation RESPIRATION BLADDER Height & Weight     Self, Time, Situation, Place  O2(Nasal cannula 2L) Continent, Indwelling catheter Weight: 107.3 kg (236 lb 8.9 oz) Height:  5\' 4"  (162.6 cm)  BEHAVIORAL SYMPTOMS/MOOD NEUROLOGICAL BOWEL NUTRITION STATUS  (N/A)   Incontinent Diet(Please see DC Summary)  AMBULATORY STATUS COMMUNICATION OF NEEDS Skin   Extensive Assist  Verbally Other (Comment)(Blister on ankle)                       Personal Care Assistance Level of Assistance  Bathing, Feeding, Dressing Bathing Assistance: Maximum assistance Feeding assistance: Independent Dressing Assistance: Limited assistance     Functional Limitations Info  Sight, Hearing, Speech Sight Info: Impaired Hearing Info: Adequate Speech Info: Adequate    SPECIAL CARE FACTORS FREQUENCY  PT (By licensed PT), OT (By licensed OT)     PT Frequency: 5x/week OT Frequency: 3x/week            Contractures      Additional Factors Info  Code Status, Allergies Code Status Info: Full Allergies Info: Cephalexin           Current Medications (09/12/2017):  This is the current hospital active medication list Current Facility-Administered Medications  Medication Dose Route Frequency Provider Last Rate Last Dose  . 0.9 %  sodium chloride infusion  250 mL Intravenous PRN Isaac Bliss, Rayford Halsted, MD   Stopped at 09/10/17 1748  . 0.9 %  sodium chloride infusion  250 mL Intravenous Continuous Clegg, Amy D, NP      . acetaminophen (TYLENOL) tablet 650 mg  650 mg Oral Q4H PRN Isaac Bliss, Rayford Halsted, MD   650 mg at 09/12/17 1011  . amiodarone (NEXTERONE PREMIX) 360-4.14 MG/200ML-% (1.8 mg/mL) IV infusion  60 mg/hr Intravenous Continuous Clegg, Amy D, NP 33.3 mL/hr at 09/12/17 0802 60 mg/hr at 09/12/17  0802   Followed by  . amiodarone (NEXTERONE PREMIX) 360-4.14 MG/200ML-% (1.8 mg/mL) IV infusion  30 mg/hr Intravenous Continuous Clegg, Amy D, NP      . apixaban (ELIQUIS) tablet 5 mg  5 mg Oral BID Clegg, Amy D, NP   5 mg at 09/12/17 1011  . bisacodyl (DULCOLAX) EC tablet 10 mg  10 mg Oral Daily PRN Isaac Bliss, Rayford Halsted, MD   10 mg at 09/07/17 1709  . cefTRIAXone (ROCEPHIN) 1 g in sodium chloride 0.9 % 100 mL IVPB  1 g Intravenous Q24H Clegg, Amy D, NP   Stopped at 09/11/17 1814  . Chlorhexidine Gluconate Cloth 2 % PADS 6 each  6 each Topical Daily Isaac Bliss, Rayford Halsted, MD   6 each at 09/12/17 [phone]  . milrinone (PRIMACOR) 20 MG/100 ML (0.2 mg/mL) infusion  0.25 mcg/kg/min Intravenous Continuous Clegg, Amy D, NP 8.2 mL/hr at 09/12/17 0700 0.25 mcg/kg/min at 09/12/17 0700  . norepinephrine (LEVOPHED) 4mg  in D5W 232mL premix infusion  2 mcg/min Intravenous Titrated Larey Dresser, MD 7.5 mL/hr at 09/12/17 1016 2 mcg/min at 09/12/17 1016  . ondansetron (ZOFRAN) injection 4 mg  4 mg Intravenous Q6H PRN Isaac Bliss, Rayford Halsted, MD   4 mg at 09/10/17 1335  . potassium chloride SA (K-DUR,KLOR-CON) CR tablet 20 mEq  20 mEq Oral Daily Clegg, Amy D, NP   20 mEq at 09/12/17 1011  . sodium chloride flush (NS) 0.9 % injection 10-40 mL  10-40 mL Intracatheter Q12H Isaac Bliss, Rayford Halsted, MD   10 mL at 09/11/17 2306  . sodium chloride flush (NS) 0.9 % injection 10-40 mL  10-40 mL Intracatheter PRN Isaac Bliss, Rayford Halsted, MD      . sodium chloride flush (NS) 0.9 % injection 3 mL  3 mL Intravenous Q12H Isaac Bliss, Rayford Halsted, MD   3 mL at 09/10/17 2115  . sodium chloride flush (NS) 0.9 % injection 3 mL  3 mL Intravenous PRN Isaac Bliss, Rayford Halsted, MD      . sodium chloride flush (NS) 0.9 % injection 3 mL  3 mL Intravenous Q12H Clegg, Amy D, NP   10 mL at 09/12/17 1012  . sodium chloride flush (NS) 0.9 % injection 3 mL  3 mL Intravenous PRN Clegg, Amy D, NP      . torsemide (DEMADEX) tablet 20 mg  20 mg Oral Daily Clegg, Amy D, NP   20 mg at 09/12/17 1011  . traMADol (ULTRAM) tablet 50 mg  50 mg Oral Q12H PRN Isaac Bliss, Rayford Halsted, MD   50 mg at 09/09/17 1025     Discharge Medications: Please see discharge summary for a list of discharge medications.  Relevant Imaging Results:  Relevant Lab Results:   Additional Information SSN: [place], [place]

## 2017-09-12 NOTE — Progress Notes (Signed)
  Amiodarone Drug - Drug Interaction Consult Note  Recommendations: Monitor electrolytes and EKG. Amiodarone is metabolized by the cytochrome P450 system and therefore has the potential to cause many drug interactions. Amiodarone has an average plasma half-life of 50 days (range 20 to 100 days).   There is potential for drug interactions to occur several weeks or months after stopping treatment and the onset of drug interactions may be slow after initiating amiodarone.   []  Statins: Increased risk of myopathy. Simvastatin- restrict dose to 20mg  daily. Other statins: counsel patients to report any muscle pain or weakness immediately.  []  Anticoagulants: Amiodarone can increase anticoagulant effect. Consider warfarin dose reduction. Patients should be monitored closely and the dose of anticoagulant altered accordingly, remembering that amiodarone levels take several weeks to stabilize.  []  Antiepileptics: Amiodarone can increase plasma concentration of phenytoin, the dose should be reduced. Note that small changes in phenytoin dose can result in large changes in levels. Monitor patient and counsel on signs of toxicity.  []  Beta blockers: increased risk of bradycardia, AV block and myocardial depression. Sotalol - avoid concomitant use.  []   Calcium channel blockers (diltiazem and verapamil): increased risk of bradycardia, AV block and myocardial depression.  []   Cyclosporine: Amiodarone increases levels of cyclosporine. Reduced dose of cyclosporine is recommended.  []  Digoxin dose should be halved when amiodarone is started.  [x]  Diuretics: increased risk of cardiotoxicity if hypokalemia occurs.  []  Oral hypoglycemic agents (glyburide, glipizide, glimepiride): increased risk of hypoglycemia. Patient's glucose levels should be monitored closely when initiating amiodarone therapy.   [x]  Drugs that prolong the QT interval:  Torsades de pointes risk may be increased with concurrent use - avoid  if possible.  Monitor QTc, also keep magnesium/potassium WNL if concurrent therapy can't be avoided. [name] Antibiotics: e.g. fluoroquinolones, erythromycin. . Antiarrhythmics: e.g. quinidine, procainamide, disopyramide, sotalol. . Antipsychotics: e.g. phenothiazines, haloperidol.  . Lithium, tricyclic antidepressants, and methadone. Thank You,  Excell Seltzer [place]  09/12/2017 6:57 AM

## 2017-09-12 NOTE — Progress Notes (Signed)
Patient transferred to chair with RN and PT at bedside.  Patient is very weak.  HR increased to 140s and patient was tachypneic.  Upon sitting back down in chair, patient began to settle down and look more comfortable.

## 2017-09-12 NOTE — Plan of Care (Signed)
  Problem: Activity: Goal: Risk for activity intolerance will decrease Outcome: Progressing   Problem: Activity: Goal: Capacity to carry out activities will improve Outcome: Progressing   Patient got up to chair today with RN and Physical Therapy.  She still needs a lot of work with her mobility, but this is progress.

## 2017-09-12 NOTE — Progress Notes (Addendum)
Advanced Heart Failure Rounding Note  PCP-Cardiologist: Carlyle Dolly, MD   Subjective:    Transferred to [facility] 7/10.    S/P DC-CV with 7/11 conversion to NSR. Developed bradycardia so IV amio stopped and po amio started. Around 2200 she went back in A fib RVR.   Remains on norepi 3 mcg + milrinone 0.25 mcg. Continues to diurese with IV lasix. Weight down 3 pounds.   Urine CX pending.    Denies SOB.      Objective:   Weight Range: 236 lb 8.9 oz (107.3 kg) Body mass index is 40.6 kg/m.   Vital Signs:   Temp:  [97.9 F (36.6 C)-98.7 F (37.1 C)] 98.7 F (37.1 C) (07/12 0400) Pulse Rate:  [34-110] 59 (07/12 0600) Resp:  [0-25] 12 (07/12 0600) BP: (77-136)/(32-96) 93/57 (07/12 0600) SpO2:  [96 %-100 %] 97 % (07/12 0600) Weight:  [236 lb 8.9 oz (107.3 kg)] 236 lb 8.9 oz (107.3 kg) (07/12 0600) Last BM Date: 09/11/17  Weight change: Filed Weights   09/10/17 0500 09/11/17 0600 09/12/17 0600  Weight: 240 lb 15.4 oz (109.3 kg) 239 lb 9.6 oz (108.7 kg) 236 lb 8.9 oz (107.3 kg)    Intake/Output:   Intake/Output Summary (Last 24 hours) at 09/12/2017 0649 Last data filed at 09/12/2017 0600 Gross per 24 hour  Intake 1022.6 ml  Output 1635 ml  Net -612.4 ml      Physical Exam  CVP 1-2 personally checked.  General:  No resp difficulty HEENT: normal Neck: supple. no JVD. Carotids 2+ bilat; no bruits. No lymphadenopathy or thryomegaly appreciated. RIJ  Cor: PMI nondisplaced. Irregular rate & rhythm. No rubs, gallops or murmurs. Lungs: clear Abdomen: soft, nontender, nondistended. No hepatosplenomegaly. No bruits or masses. Good bowel sounds. Extremities: no cyanosis, clubbing, rash, R and LLE 1-2+edema Neuro: alert & orientedx3, cranial nerves grossly intact. moves all 4 extremities w/o difficulty. Affect pleasant   Telemetry  Back in A fib around 2200. A fib 120s.   EKG    n/a  Labs    CBC Recent Labs    09/11/17 0420 09/12/17 0453  WBC 4.9 5.8    HGB 11.1* 11.8*  HCT 36.9 39.6  MCV 100.3* 99.7  PLT 180 858   Basic Metabolic Panel Recent Labs    09/10/17 0506 09/11/17 0420 09/12/17 0453  NA 139  139 137 140  K 4.9  4.9 4.2 3.8  CL 101  102 97* 99  CO2 24  24 27 26   GLUCOSE 127*  128* 222* 144*  BUN 116*  116* 108* 104*  CREATININE 2.56*  2.60* 2.90* 2.77*  CALCIUM 8.8*  8.8* 8.2* 8.3*  PHOS 6.3*  --   --    Liver Function Tests Recent Labs    09/10/17 0506 09/12/17 0453  AST 150* 38  ALT 178* 114*  ALKPHOS 57 51  BILITOT 1.1 0.8  PROT 5.9* 4.9*  ALBUMIN 3.1*  3.1* 2.6*   No results for input(s): LIPASE, AMYLASE in the last 72 hours. Cardiac Enzymes No results for input(s): CKTOTAL, CKMB, CKMBINDEX, TROPONINI in the last 72 hours.  BNP: BNP (last 3 results) Recent Labs    09/02/17 1512  BNP >4,500.0*    ProBNP (last 3 results) No results for input(s): PROBNP in the last 8760 hours.   D-Dimer No results for input(s): DDIMER in the last 72 hours. Hemoglobin A1C No results for input(s): HGBA1C in the last 72 hours. Fasting Lipid Panel No results for input(s):  CHOL, HDL, LDLCALC, TRIG, CHOLHDL, LDLDIRECT in the last 72 hours. Thyroid Function Tests No results for input(s): TSH, T4TOTAL, T3FREE, THYROIDAB in the last 72 hours.  Invalid input(s): FREET3  Other results:   Imaging    No results found.   Medications:     Scheduled Medications: . amiodarone  200 mg Oral BID  . apixaban  5 mg Oral BID  . Chlorhexidine Gluconate Cloth  6 each Topical Daily  . furosemide  80 mg Intravenous BID  . potassium chloride  20 mEq Oral Daily  . sodium chloride flush  10-40 mL Intracatheter Q12H  . sodium chloride flush  3 mL Intravenous Q12H  . sodium chloride flush  3 mL Intravenous Q12H    Infusions: . sodium chloride Stopped (09/10/17 1748)  . sodium chloride    . cefTRIAXone (ROCEPHIN)  IV Stopped (09/11/17 1814)  . milrinone 0.25 mcg/kg/min (09/12/17 0600)  . norepinephrine  (LEVOPHED) Adult infusion 3 mcg/min (09/12/17 0600)    PRN Medications: sodium chloride, acetaminophen, bisacodyl, ondansetron (ZOFRAN) IV, sodium chloride flush, sodium chloride flush, sodium chloride flush, traMADol    Patient Profile   Gina [name] is a 77 year old with a history chronic systolic heart failure, anemia, htn, LBBB, renal artery stenosis, and OSA.   Transferred to [facility] on 7/10 with cardiogenic shock, Afib RVR, and volume overload.    Assessment/Plan  1. Cardiogenic Shock  EF has been low 20-25% over 4 years. NICM cath 2009 normal cors.  ECHO 09/09/2017 EF 20% RV mildly dilated . Check SPEP/UPEP.  -CO-OX 40% on dobutamine 2 mcg. She was switched to milrinone 0.25 mcg.  Current on milrinone 0.25 mcg + norepi 3 mcg. Co-OX 71%.     -Volume status improved. CVp down 2. Stop IV lasix.  - Will need foley cath. Strict I/Os  -No bb with shock. Last dose  Toprol xl stopped 09/09/2017  -No arb/sprio/dig with elevated creatinine -Renal function coming down.  - Add ted hose.   2. A fib RVR -On amio drip + apixaban.  -S/P DC=CV 7/11 with conversion to NSR. Bradycardiic post cardioversion so amio drip was stopped. Back in A fib last night.  - restart amio drip. -Set DC-CV next week.     3. AKI Worsening creatinine 2.4>2.56>2.9 >2.77.  Renal US 09/25/2016 negative. She does not take NSAIDs.   4. LBBB QRS 200 Gina on EKG  She is open to CRT-D.  5. Transaminitis Elevated AST/ALT likely in the setting of cardiogenic shock. Trending down.    6. OSA Continue CPAP at night.   7. UTI UA-  Leukocytes. Continue rocephin  Urine CX preliminary e Coli  8. LLE Cellulitis Decreased erythema. WBC 5.8  Continue rocephin. Discussed with pharmacy.  OOB.     Length of Stay: [place], NP  09/12/2017, 6:49 AM  Advanced Heart Failure Team Pager [phone] (M-F; 7a - 4p)  Please contact [facility] for night-coverage after hours (4p -7a ) and weekends on  [email or web address]  Patient seen with NP, agree with the above note. She is on milrinone 0.25 + norepinephrine 3.  CVP 2 today with co-ox 71%.  She was cardioverted yesterday to NSR but went back into afib/RVR overnight.  Creatinine down to 2.77.   On exam, JVP not elevated, tachy/irregular S1S2, legs wrapped with 1+ edema to knees.   1. Acute on chronic systolic CHF: Echo (5/36) with EF 20%. Long-standing cardiomyopathy. Nonischemic based on cath in 2017. She had low output HF  with co-ox 41%, CVP 17, AKI on CKD stage 3 ("cold and wet"). Exacerbation triggered by atrial fibrillation and also by being off Lasix for a number of days after hospitalization at [facility] for UTI.  Today, co-ox up to 71% on milrinone 0.25 + norepinephrine 3.  CVP 2 today. Unfortunately, back into afib after successful cardioversion yesterday.   - Continue milrinone 0.25 and norepinephrine 3.   - Hold Lasix today with low CVP.  - No ACEI/ARB/ARNI/digoxin/spironolactone with AKI.  - No beta blocker with low output.  - Has wide LBBB, ultimately needs CRT.  - Needs conversion to NSR.  2. AKI on CKD stage 3: Suspect cardiorenal syndrome with low output HF. No NSAID use.  With improved BP and cardiac output, creatinine starting to trend down.  2.77 today.  3. Atrial fibrillation: Admitted with afib/RVR, has been in afib for about 6 wks. Has been on Eliquis for about 4 weeks without missing any doses. Cardioverted to NSR 7/11 but back in atrial fibrillation with RVR this morning.  - Restart amiodarone gtt.  Suspect she needs additional amiodarone loading and then repeat DCCV.  - Continue Eliquis.  - If we cannot keep her in NSR with amiodarone, would consider AV nodal ablation with BiV pacing.   4. Elevated LFTs: Suspect shock liver with low cardiac output. Follow CMET.  5. UTI: E coli. Continue ceftriaxone.  6. Deconditioning: PT/OT consults. Out of bed.   CRITICAL CARE Performed by: [name]  Total critical care  time: 35 minutes  Critical care time was exclusive of separately billable procedures and treating other patients.  Critical care was necessary to treat or prevent imminent or life-threatening deterioration.  Critical care was time spent personally by me on the following activities: development of treatment plan with patient and/or surrogate as well as nursing, discussions with consultants, evaluation of patient's response to treatment, examination of patient, obtaining history from patient or surrogate, ordering and performing treatments and interventions, ordering and review of laboratory studies, ordering and review of radiographic studies, pulse oximetry and re-evaluation of patient's condition.  [name] 09/12/2017 7:41 AM

## 2017-09-12 NOTE — Progress Notes (Signed)
   Cardioversion set up for Monday 7/15 at 1300.   Jessiah Wojnar NP-C  2:01 PM

## 2017-09-12 NOTE — Progress Notes (Signed)
Physical Therapy Treatment Patient Details Name: Gina [name] MRN: [id number] DOB: 11-21-1940 Today's Date: 09/12/2017    History of Present Illness 78 yo transfer from [facility] to [facility] 7/10 with cardiogenic shock, Afib and volume overload s/p DCCV 7/11 with return to Afib post procedure. PMHx: CHF, anemia, HTN, Left BBB, OSA, renal artery stenosis, CKD, CAD    PT Comments    Pt pleasant and very eager to move stating she feels she hasn't been moving and wants to be able to return home without SNF. Pt aware end of session of her decreased mobility, need for physical assist and need for significant progression in function to be able to return home with spouse. Pt educated for transfers and HEp with encouragement to continue mobilizing daily with nursing assist.   HR 122-140 with standing and pivot, SpO2 93% on RA with 95% end of session on 1L    Follow Up Recommendations  SNF;Supervision/Assistance - 24 hour     Equipment Recommendations  None recommended by PT    Recommendations for Other Services       Precautions / Restrictions Precautions Precautions: Fall Restrictions Weight Bearing Restrictions: No    Mobility  Bed Mobility Overal bed mobility: Needs Assistance Bed Mobility: Rolling;Sidelying to Sit Rolling: Min assist Sidelying to sit: Mod assist       General bed mobility comments: cues for sequence with assist to bend knee and roll to right with hand on rail and assist to rotate pelvis. Assist to bring legs off bed and elevate trunk. Mod assist for reciprocal scooting  Transfers Overall transfer level: Needs assistance   Transfers: Sit to/from Stand Sit to Stand: Mod assist Stand pivot transfers: Min assist;+2 safety/equipment       General transfer comment: mod assist to stand from elevated bed with left knee blocked, cues for hand placement and assist to rise. min assist to pivot bed to chair with RW. Pt fatigued with activity with HR rise to 140 with  transfer  Ambulation/Gait             General Gait Details: unable   Stairs             Wheelchair Mobility    Modified Rankin (Stroke Patients Only)       Balance Overall balance assessment: Needs assistance Sitting-balance support: Feet supported;Bilateral upper extremity supported Sitting balance-Leahy Scale: Fair     Standing balance support: Bilateral upper extremity supported;During functional activity Standing balance-Leahy Scale: Poor                              Cognition Arousal/Alertness: Awake/alert Behavior During Therapy: WFL for tasks assessed/performed Overall Cognitive Status: Within Functional Limits for tasks assessed                                        Exercises General Exercises - Lower Extremity Long Arc Quad: AROM;15 reps;Seated;Both Hip Flexion/Marching: AROM;10 reps;Seated;Both    General Comments        Pertinent Vitals/Pain Pain Score: 6  Pain Location: bil LE and LBP Pain Descriptors / Indicators: Aching;Sore Pain Intervention(s): Limited activity within patient's tolerance;Repositioned;Monitored during session    Home Living                      Prior Function  PT Goals (current goals can now be found in the care plan section) Progress towards PT goals: Progressing toward goals;Goals downgraded-see care plan    Frequency    Min 3X/week      PT Plan Current plan remains appropriate    Co-evaluation              AM-PAC PT "6 Clicks" Daily Activity  Outcome Measure  Difficulty turning over in bed (including adjusting bedclothes, sheets and blankets)?: Unable Difficulty moving from lying on back to sitting on the side of the bed? : Unable Difficulty sitting down on and standing up from a chair with arms (e.g., wheelchair, bedside commode, etc,.)?: Unable Help needed moving to and from a bed to chair (including a wheelchair)?: A Lot Help needed walking  in hospital room?: A Lot Help needed climbing 3-5 steps with a railing? : Total 6 Click Score: 8    End of Session Equipment Utilized During Treatment: Gait belt Activity Tolerance: Patient tolerated treatment well Patient left: in chair;with call bell/phone within reach;with chair alarm set;with nursing/sitter in room Nurse Communication: Mobility status;Precautions PT Visit Diagnosis: Unsteadiness on feet (R26.81);Other abnormalities of gait and mobility (R26.89);Muscle weakness (generalized) (M62.81)     Time: 7340-3709 PT Time Calculation (min) (ACUTE ONLY): 24 min  Charges:  $Therapeutic Activity: 23-37 mins                    G Codes:       Elwyn Reach, PT [phone]    [place] 09/12/2017, 11:04 AM

## 2017-09-13 LAB — CBC
HCT: 37.4 % (ref 36.0–46.0)
Hemoglobin: 11.2 g/dL — ABNORMAL LOW (ref 12.0–15.0)
MCH: 29.8 pg (ref 26.0–34.0)
MCHC: 29.9 g/dL — ABNORMAL LOW (ref 30.0–36.0)
MCV: 99.5 fL (ref 78.0–100.0)
Platelets: 227 10*3/uL (ref 150–400)
RBC: 3.76 MIL/uL — ABNORMAL LOW (ref 3.87–5.11)
RDW: 18.7 % — ABNORMAL HIGH (ref 11.5–15.5)
WBC: 4.3 10*3/uL (ref 4.0–10.5)

## 2017-09-13 LAB — COMPREHENSIVE METABOLIC PANEL
ALT: 103 U/L — ABNORMAL HIGH (ref 0–44)
AST: 37 U/L (ref 15–41)
Albumin: 2.5 g/dL — ABNORMAL LOW (ref 3.5–5.0)
Alkaline Phosphatase: 50 U/L (ref 38–126)
Anion gap: 12 (ref 5–15)
BUN: 98 mg/dL — ABNORMAL HIGH (ref 8–23)
CO2: 27 mmol/L (ref 22–32)
Calcium: 8 mg/dL — ABNORMAL LOW (ref 8.9–10.3)
Chloride: 100 mmol/L (ref 98–111)
Creatinine, Ser: 2.75 mg/dL — ABNORMAL HIGH (ref 0.44–1.00)
GFR calc Af Amer: 18 mL/min — ABNORMAL LOW (ref >60)
GFR calc non Af Amer: 16 mL/min — ABNORMAL LOW (ref >60)
Glucose, Bld: 162 mg/dL — ABNORMAL HIGH (ref 70–99)
Potassium: 3.4 mmol/L — ABNORMAL LOW (ref 3.5–5.1)
Sodium: 139 mmol/L (ref 135–145)
Total Bilirubin: 0.6 mg/dL (ref 0.3–1.2)
Total Protein: 4.8 g/dL — ABNORMAL LOW (ref 6.5–8.1)

## 2017-09-13 LAB — COOXEMETRY PANEL
Carboxyhemoglobin: 1.7 % — ABNORMAL HIGH (ref 0.5–1.5)
Methemoglobin: 1.3 % (ref 0.0–1.5)
O2 Saturation: 69.5 %
Total hemoglobin: 11.3 g/dL — ABNORMAL LOW (ref 12.0–16.0)

## 2017-09-13 LAB — URINE CULTURE: Culture: >=100000 — AB

## 2017-09-13 MED ORDER — SODIUM CHLORIDE 0.9% FLUSH: 3.0000 mL | Freq: Two times a day (BID) | INTRAVENOUS | Status: DC

## 2017-09-13 MED ORDER — POTASSIUM CHLORIDE CRYS ER 20 MEQ PO TBCR
40.0000 meq | EXTENDED_RELEASE_TABLET | Freq: Once | ORAL | Status: AC
  Administered 2017-09-13: 40 meq via ORAL
  Filled 2017-09-13: qty 2

## 2017-09-13 MED ORDER — SODIUM CHLORIDE 0.9% FLUSH: 3.0000 mL | INTRAVENOUS | Status: DC | PRN

## 2017-09-13 MED ORDER — SODIUM CHLORIDE 0.9 % IV SOLN
INTRAVENOUS | Status: DC
  Administered 2017-09-15: 12:00:00 via INTRAVENOUS

## 2017-09-13 MED ORDER — SODIUM CHLORIDE 0.9% FLUSH
3.0000 mL | Freq: Two times a day (BID) | INTRAVENOUS | Status: DC
  Administered 2017-09-14 – 2017-09-16 (×5): 3 mL via INTRAVENOUS

## 2017-09-13 MED ORDER — SODIUM CHLORIDE 0.9% FLUSH
3.0000 mL | Freq: Two times a day (BID) | INTRAVENOUS | Status: DC
  Administered 2017-09-13: 10 mL via INTRAVENOUS
  Administered 2017-09-14: 3 mL via INTRAVENOUS

## 2017-09-13 MED ORDER — ASPIRIN 81 MG PO CHEW
81.0000 mg | CHEWABLE_TABLET | ORAL | Status: AC
  Administered 2017-09-14: 81 mg via ORAL
  Filled 2017-09-13: qty 1

## 2017-09-13 MED ORDER — SODIUM CHLORIDE 0.9 % IV SOLN: 250.0000 mL | INTRAVENOUS | Status: DC

## 2017-09-13 MED ORDER — SODIUM CHLORIDE 0.9 % IV SOLN: 250.0000 mL | INTRAVENOUS | Status: DC | PRN

## 2017-09-13 MED ORDER — FUROSEMIDE 10 MG/ML IJ SOLN
60.0000 mg | Freq: Two times a day (BID) | INTRAMUSCULAR | Status: DC
  Administered 2017-09-13 – 2017-09-15 (×6): 60 mg via INTRAVENOUS
  Filled 2017-09-13 (×7): qty 6

## 2017-09-13 NOTE — Progress Notes (Signed)
Patient ID: Gina [name], female   DOB: Jan 02, 1941, 77 y.o.   MRN: [id number]     Advanced Heart Failure Rounding Note  PCP-Cardiologist: Carlyle Dolly, MD   Subjective:    Transferred to [facility] 7/10.   S/P DC-CV with 7/11 conversion to NSR. Developed bradycardia so IV amio stopped and po amio started. Around 2200 on 7/11 she went back into A fib RVR.  She says that she definitely felt better in NSR.   This morning, she is still in afib/RVR with HR 110s-120s on amiodarone gtt. Not short of breath at rest but very weak with any exertion.   Remains on norepi 2 mcg + milrinone 0.25 mcg. CVP 12 this morning with co-ox 78%.  BUN/creatinine with slight down-trend.   Urine CX with E coli, now on ceftriaxone.    Objective:   Weight Range: 252 lb 3.3 oz (114.4 kg) Body mass index is 43.29 kg/m.   Vital Signs:   Temp:  [97.7 F (36.5 C)-98.9 F (37.2 C)] 97.7 F (36.5 C) (07/13 0316) Pulse Rate:  [42-129] 107 (07/13 0700) Resp:  [0-29] 19 (07/13 0700) BP: (85-127)/(32-107) 105/84 (07/13 0700) SpO2:  [89 %-100 %] 94 % (07/13 0700) Weight:  [252 lb 3.3 oz (114.4 kg)] 252 lb 3.3 oz (114.4 kg) (07/13 0500) Last BM Date: 09/12/17  Weight change: Filed Weights   09/11/17 0600 09/12/17 0600 09/13/17 0500  Weight: 239 lb 9.6 oz (108.7 kg) 236 lb 8.9 oz (107.3 kg) 252 lb 3.3 oz (114.4 kg)    Intake/Output:   Intake/Output Summary (Last 24 hours) at 09/13/2017 0830 Last data filed at 09/13/2017 0700 Gross per 24 hour  Intake 1915.79 ml  Output 1655 ml  Net 260.79 ml      Physical Exam   CVP 12 personally checked.  General: NAD Neck: JVP 10-12 cm, no thyromegaly or thyroid nodule.  Lungs: Clear to auscultation bilaterally with normal respiratory effort. CV: Lateral PMI.  Heart tachy, irregular S1/S2, no S3/S4, 1/6 HSM LLSB.  1+ edema to knees.   Abdomen: Soft, nontender, no hepatosplenomegaly, no distention.  Skin: Intact without lesions or rashes.  Neurologic: Alert and  oriented x 3.  Psych: Normal affect. Extremities: No clubbing or cyanosis.  HEENT: Normal.    Telemetry   Atrial fibrillation 110s-120s, personally reviewed.   EKG    n/a  Labs    CBC Recent Labs    09/12/17 0453 09/13/17 0356  WBC 5.8 4.3  HGB 11.8* 11.2*  HCT 39.6 37.4  MCV 99.7 99.5  PLT 253 790   Basic Metabolic Panel Recent Labs    09/12/17 0453 09/13/17 0356  NA 140 139  K 3.8 3.4*  CL 99 100  CO2 26 27  GLUCOSE 144* 162*  BUN 104* 98*  CREATININE 2.77* 2.75*  CALCIUM 8.3* 8.0*   Liver Function Tests Recent Labs    09/12/17 0453 09/13/17 0356  AST 38 37  ALT 114* 103*  ALKPHOS 51 50  BILITOT 0.8 0.6  PROT 4.9* 4.8*  ALBUMIN 2.6* 2.5*   No results for input(s): LIPASE, AMYLASE in the last 72 hours. Cardiac Enzymes No results for input(s): CKTOTAL, CKMB, CKMBINDEX, TROPONINI in the last 72 hours.  BNP: BNP (last 3 results) Recent Labs    09/02/17 1512  BNP >4,500.0*    ProBNP (last 3 results) No results for input(s): PROBNP in the last 8760 hours.   D-Dimer No results for input(s): DDIMER in the last 72 hours. Hemoglobin A1C No  results for input(s): HGBA1C in the last 72 hours. Fasting Lipid Panel No results for input(s): CHOL, HDL, LDLCALC, TRIG, CHOLHDL, LDLDIRECT in the last 72 hours. Thyroid Function Tests No results for input(s): TSH, T4TOTAL, T3FREE, THYROIDAB in the last 72 hours.  Invalid input(s): FREET3  Other results:   Imaging    No results found.   Medications:     Scheduled Medications: . apixaban  5 mg Oral BID  . Chlorhexidine Gluconate Cloth  6 each Topical Daily  . furosemide  60 mg Intravenous BID  . potassium chloride  20 mEq Oral Daily  . potassium chloride  40 mEq Oral Once  . sodium chloride flush  10-40 mL Intracatheter Q12H  . sodium chloride flush  3 mL Intravenous Q12H  . sodium chloride flush  3 mL Intravenous Q12H  . torsemide  20 mg Oral Daily    Infusions: . sodium chloride  Stopped (09/10/17 1748)  . sodium chloride    . amiodarone 30 mg/hr (09/13/17 0700)  . cefTRIAXone (ROCEPHIN)  IV Stopped (09/12/17 1803)  . milrinone 0.25 mcg/kg/min (09/13/17 0700)  . norepinephrine (LEVOPHED) Adult infusion 2 mcg/min (09/13/17 0701)    PRN Medications: sodium chloride, acetaminophen, bisacodyl, ondansetron (ZOFRAN) IV, sodium chloride flush, sodium chloride flush, sodium chloride flush, traMADol    Patient Profile   Ms Francesconi is a 77 year old with a history chronic systolic heart failure, anemia, htn, LBBB, renal artery stenosis, and OSA.   Transferred to [facility] on 7/10 with cardiogenic shock, Afib RVR, and volume overload.    Assessment/Plan   1. Acute on chronic systolic CHF: Echo (1/61) with EF 20%. Long-standing cardiomyopathy. Nonischemic based on cath in 2017. She had low output HF with co-ox 41%, CVP 17, AKI on CKD stage 3 ("cold and wet"). Exacerbation triggered by atrial fibrillation and also by being off Lasix for a number of days after hospitalization at [facility] for UTI.  Today, co-ox up to 70% on milrinone 0.25 + norepinephrine 2.  CVP up to 12 today. Unfortunately, back into afib after successful cardioversion Thursday.  She felt much better in NSR, now weak again.  - Continue milrinone 0.25 and norepinephrine 2 for now, will decrease milrinone in am prior to repeat DCCV.   - Lasix 60 mg IV bid + KCl 40 today.  - No ACEI/ARB/ARNI/digoxin/spironolactone with AKI.  - No beta blocker with low output.  - Has wide LBBB, ultimately needs CRT.  - Needs conversion to NSR => will load more amiodarone and plan for tomorrow.  2. AKI on CKD stage 3: Suspect cardiorenal syndrome with low output HF. No NSAID use.  With improved BP and cardiac output, creatinine starting to trend down slowly.  2.75 today.  3. Atrial fibrillation: Admitted with afib/RVR, has been in afib for about 6 wks. Has been on Eliquis for about 4 weeks without missing any doses.  Cardioverted to NSR 7/11 but back in atrial fibrillation with RVR by 7/12.  - Continue amiodarone gtt.  Suspect she needs additional amiodarone loading and then repeat DCCV => will plan for tomorrow, keep NPO at midnight. We discussed risks/benefits of procedure and she agrees to it.  I will decrease milrinone to 0.125 prior to DCCV.  - Continue Eliquis.  - If we cannot keep her in NSR with amiodarone, would consider AV nodal ablation with BiV pacing.   4. Elevated LFTs: Suspect shock liver with low cardiac output. Follow CMET.  5. UTI: E coli. Continue ceftriaxone.  6. Deconditioning:  PT/OT consults. Out of bed.   CRITICAL CARE Performed by: [name]  Total critical care time: 35 minutes  Critical care time was exclusive of separately billable procedures and treating other patients.  Critical care was necessary to treat or prevent imminent or life-threatening deterioration.  Critical care was time spent personally by me on the following activities: development of treatment plan with patient and/or surrogate as well as nursing, discussions with consultants, evaluation of patient's response to treatment, examination of patient, obtaining history from patient or surrogate, ordering and performing treatments and interventions, ordering and review of laboratory studies, ordering and review of radiographic studies, pulse oximetry and re-evaluation of patient's condition.  [name] 09/13/2017 8:30 AM

## 2017-09-13 NOTE — Progress Notes (Signed)
Attempted to tx pt from bed to chair but pt's HR went as high as 130s  Pt did not tolerate well activity and was having SOB.   Repositioned pt back onto bed w/assisst from 3 other RNs.   Pt HR went back down to 112 w/O2 at 95 room air.   Pt is relaxing in bed and will continue to monitor.

## 2017-09-13 NOTE — Plan of Care (Signed)
  Problem: Activity: Goal: Risk for activity intolerance will decrease Outcome: Progressing   Problem: Nutrition: Goal: Adequate nutrition will be maintained Outcome: Progressing   Problem: Elimination: Goal: Will not experience complications related to urinary retention Outcome: Progressing   Problem: Pain Managment: Goal: General experience of comfort will improve Outcome: Progressing   Problem: Safety: Goal: Ability to remain free from injury will improve Outcome: Progressing   Problem: Skin Integrity: Goal: Risk for impaired skin integrity will decrease Outcome: Progressing   Problem: Activity: Goal: Capacity to carry out activities will improve Outcome: Progressing

## 2017-09-13 NOTE — Plan of Care (Signed)
  Problem: Activity: Goal: Risk for activity intolerance will decrease Outcome: Progressing   Problem: Pain Managment: Goal: General experience of comfort will improve Outcome: Progressing   Problem: Safety: Goal: Ability to remain free from injury will improve Outcome: Progressing   Problem: Skin Integrity: Goal: Risk for impaired skin integrity will decrease Outcome: Progressing   Problem: Activity: Goal: Capacity to carry out activities will improve Outcome: Progressing

## 2017-09-14 ENCOUNTER — Inpatient Hospital Stay (HOSPITAL_COMMUNITY): Payer: Medicare Other | Admitting: Critical Care Medicine

## 2017-09-14 ENCOUNTER — Encounter (HOSPITAL_COMMUNITY): Payer: Self-pay | Admitting: Critical Care Medicine

## 2017-09-14 ENCOUNTER — Encounter (HOSPITAL_COMMUNITY)
Admission: EM | Disposition: A | Discharge status: Skilled Nursing Facility | Payer: Self-pay | Source: Home / Self Care | Attending: Cardiology

## 2017-09-14 HISTORY — PX: CARDIOVERSION: SHX1299

## 2017-09-14 LAB — COMPREHENSIVE METABOLIC PANEL
ALT: 87 U/L — ABNORMAL HIGH (ref 0–44)
AST: 30 U/L (ref 15–41)
Albumin: 2.4 g/dL — ABNORMAL LOW (ref 3.5–5.0)
Alkaline Phosphatase: 50 U/L (ref 38–126)
Anion gap: 9 (ref 5–15)
BUN: 86 mg/dL — ABNORMAL HIGH (ref 8–23)
CO2: 27 mmol/L (ref 22–32)
Calcium: 7.9 mg/dL — ABNORMAL LOW (ref 8.9–10.3)
Chloride: 103 mmol/L (ref 98–111)
Creatinine, Ser: 2.44 mg/dL — ABNORMAL HIGH (ref 0.44–1.00)
GFR calc Af Amer: 21 mL/min — ABNORMAL LOW (ref >60)
GFR calc non Af Amer: 18 mL/min — ABNORMAL LOW (ref >60)
Glucose, Bld: 168 mg/dL — ABNORMAL HIGH (ref 70–99)
Potassium: 3.3 mmol/L — ABNORMAL LOW (ref 3.5–5.1)
Sodium: 139 mmol/L (ref 135–145)
Total Bilirubin: 0.7 mg/dL (ref 0.3–1.2)
Total Protein: 4.4 g/dL — ABNORMAL LOW (ref 6.5–8.1)

## 2017-09-14 LAB — CBC
HCT: 36.4 % (ref 36.0–46.0)
Hemoglobin: 11 g/dL — ABNORMAL LOW (ref 12.0–15.0)
MCH: 30.1 pg (ref 26.0–34.0)
MCHC: 30.2 g/dL (ref 30.0–36.0)
MCV: 99.5 fL (ref 78.0–100.0)
Platelets: 220 10*3/uL (ref 150–400)
RBC: 3.66 MIL/uL — ABNORMAL LOW (ref 3.87–5.11)
RDW: 18.6 % — ABNORMAL HIGH (ref 11.5–15.5)
WBC: 4.1 10*3/uL (ref 4.0–10.5)

## 2017-09-14 LAB — MAGNESIUM: Magnesium: 2.4 mg/dL (ref 1.7–2.4)

## 2017-09-14 LAB — COOXEMETRY PANEL
Carboxyhemoglobin: 2 % — ABNORMAL HIGH (ref 0.5–1.5)
Methemoglobin: 0.8 % (ref 0.0–1.5)
O2 Saturation: 67.7 %
Total hemoglobin: 11.2 g/dL — ABNORMAL LOW (ref 12.0–16.0)

## 2017-09-14 LAB — PROTIME-INR
INR: 1.91
Prothrombin Time: 21.7 seconds — ABNORMAL HIGH (ref 11.4–15.2)

## 2017-09-14 LAB — TSH: TSH: 3.162 u[IU]/mL (ref 0.350–4.500)

## 2017-09-14 SURGERY — CARDIOVERSION
Anesthesia: General

## 2017-09-14 MED ORDER — SALINE SPRAY 0.65 % NA SOLN
1.0000 | NASAL | Status: DC | PRN
  Administered 2017-09-14: 1 via NASAL
  Filled 2017-09-14: qty 44

## 2017-09-14 MED ORDER — MUSCLE RUB 10-15 % EX CREA
TOPICAL_CREAM | CUTANEOUS | Status: DC | PRN
  Administered 2017-09-14 – 2017-09-21 (×2): via TOPICAL
  Filled 2017-09-14: qty 85

## 2017-09-14 MED ORDER — POTASSIUM CHLORIDE CRYS ER 20 MEQ PO TBCR
40.0000 meq | EXTENDED_RELEASE_TABLET | Freq: Once | ORAL | Status: AC
Start: 2017-09-14 — End: 2017-09-14
  Administered 2017-09-14: 40 meq via ORAL
  Filled 2017-09-14: qty 2

## 2017-09-14 MED ORDER — PROPOFOL 10 MG/ML IV BOLUS
INTRAVENOUS | Status: DC | PRN
  Administered 2017-09-14: 40 mg via INTRAVENOUS

## 2017-09-14 MED ORDER — LIDOCAINE 2% (20 MG/ML) 5 ML SYRINGE
INTRAMUSCULAR | Status: DC | PRN
  Administered 2017-09-14: 100 mg via INTRAVENOUS

## 2017-09-14 NOTE — Evaluation (Signed)
Occupational Therapy Evaluation Patient Details Name: Gina [name] MRN: [id number] DOB: 1940/04/30 Today's Date: 09/14/2017    History of Present Illness 77 yo transfer from [facility] 7/10 with cardiogenic shock, Afib and volume overload s/p DCCV 7/11 with return to Afib post procedur.  Underwent DCCV again 7/14 PMHx: CHF, anemia, HTN, Left BBB, OSA, renal artery stenosis, CKD, CAD   Clinical Impression   Pt admitted with above. She demonstrates the below listed deficits and will benefit from continued OT to maximize safety and independence with BADLs.  Pt presents to OT with generalized weakness, decreased activity tolerance, decreased balance.  She fatigues rapidly with activity DOE 3/4 - 4/4.  HR max 89 and 02 sats > 95%.  She currently requires min - total A for ADLs and mod A for functional mobility.  PTA, she lived with her spouse, who has dementia, and to whom she has to supervise.  Recommend SNF level rehab at discharge.       Follow Up Recommendations  SNF    Equipment Recommendations  None recommended by OT    Recommendations for Other Services       Precautions / Restrictions Precautions Precautions: Fall      Mobility Bed Mobility Overal bed mobility: Needs Assistance Bed Mobility: Supine to Sit     Supine to sit: Mod assist;HOB elevated     General bed mobility comments: assist to move LEs off bed, to lift trunk and to scoot hips forward   Transfers Overall transfer level: Needs assistance Equipment used: Rolling walker (2 wheeled) Transfers: Sit to/from [facility] Sit to Stand: Mod assist;+2 safety/equipment Stand pivot transfers: Min assist;+2 safety/equipment       General transfer comment: assist to boost into standing.  She was able to ambulate ~73ft to chair before fatiguing     Balance Overall balance assessment: Needs assistance Sitting-balance support: Feet supported;Bilateral upper extremity supported Sitting  balance-Leahy Scale: Poor Sitting balance - Comments: heavily reliant on UE support at EOB  Postural control: Posterior lean Standing balance support: Bilateral upper extremity supported;During functional activity Standing balance-Leahy Scale: Poor Standing balance comment: reliant on UE support                            ADL either performed or assessed with clinical judgement   ADL Overall ADL's : Needs assistance/impaired Eating/Feeding: Independent   Grooming: Wash/dry hands;Wash/dry face;Oral care;Brushing hair;Set up;Sitting   Upper Body Bathing: Moderate assistance;Sitting   Lower Body Bathing: Maximal assistance;Sit to/from stand   Upper Body Dressing : Minimal assistance;Sitting   Lower Body Dressing: Total assistance;Sit to/from stand Lower Body Dressing Details (indicate cue type and reason): Pt unable to access feet, and fatigues quickly  Toilet Transfer: Moderate assistance;Minimal assistance;+2 for physical assistance;+2 for safety/equipment;Stand-pivot;BSC;RW Toilet Transfer Details (indicate cue type and reason): assist to boost hips and assist to steady  Toileting- Clothing Manipulation and Hygiene: Total assistance;Sit to/from stand       Functional mobility during ADLs: Minimal assistance;Moderate assistance;+2 for physical assistance;+2 for safety/equipment;Rolling walker General ADL Comments: Pt with DOE 3/4- 4/4 with minmal activity.  She is unable to lean forrward to access feet or LB      Vision         Perception     Praxis      Pertinent Vitals/Pain Pain Assessment: Faces Faces Pain Scale: Hurts little more Pain Location: bil LE and LBP Pain Descriptors / Indicators:  Aching;Sore Pain Intervention(s): Monitored during session     Hand Dominance Right   Extremity/Trunk Assessment Upper Extremity Assessment Upper Extremity Assessment: Generalized weakness   Lower Extremity Assessment Lower Extremity Assessment: Generalized  weakness   Cervical / Trunk Assessment Cervical / Trunk Assessment: Normal   Communication Communication Communication: No difficulties   Cognition Arousal/Alertness: Awake/alert Behavior During Therapy: WFL for tasks assessed/performed Overall Cognitive Status: Within Functional Limits for tasks assessed                                     General Comments  HR max 89; 02 sats >95%    Exercises     Shoulder Instructions      Home Living Family/patient expects to be discharged to:: Skilled nursing facility Living Arrangements: Spouse/significant other Available Help at Discharge: Family Type of Home: House Home Access: Stairs to enter Technical brewer of Steps: 1   Home Layout: One level           Bathroom Accessibility: Yes   Home Equipment: Cane - single point;Walker - 4 wheels;Bedside commode;Shower seat;Grab bars - tub/shower;Wheelchair - manual   Additional Comments: Pt lives with spouse who has dementia       Prior Functioning/Environment Level of Independence: Independent with assistive device(s)        Comments: Pt mod I with ADLs.  Has hired cleaning person.  Spouse has dementia so pt is caregiver for him - requires supervision.   Uses Rollator at home         OT Problem List: Decreased strength;Decreased activity tolerance;Impaired balance (sitting and/or standing);Decreased knowledge of use of DME or AE;Cardiopulmonary status limiting activity;Obesity      OT Treatment/Interventions: Self-care/ADL training;Therapeutic exercise;DME and/or AE instruction;Therapeutic activities;Patient/family education;Balance training;Energy conservation    OT Goals(Current goals can be found in the care plan section) Acute Rehab OT Goals Patient Stated Goal: To get back to independence and taking care of self  OT Goal Formulation: With patient Time For Goal Achievement: 09/28/17 Potential to Achieve Goals: Good ADL Goals Pt Will Perform  Grooming: with min assist;standing Pt Will Perform Upper Body Bathing: with supervision;with set-up;sitting Pt Will Perform Lower Body Bathing: with min assist;sit to/from stand Pt Will Perform Upper Body Dressing: with set-up;with supervision;sitting Pt Will Perform Lower Body Dressing: with min assist;sit to/from stand Pt Will Transfer to Toilet: with min assist;ambulating;regular height toilet;bedside commode;grab bars Pt Will Perform Toileting - Clothing Manipulation and hygiene: with min assist;sit to/from stand Pt/caregiver will Perform Home Exercise Program: Increased strength;Right Upper extremity;Left upper extremity;With theraband;With Supervision;With written HEP provided  OT Frequency: Min 2X/week   Barriers to D/C: Decreased caregiver support          Co-evaluation              AM-PAC PT "6 Clicks" Daily Activity     Outcome Measure Help from another person eating meals?: None Help from another person taking care of personal grooming?: A Little Help from another person toileting, which includes using toliet, bedpan, or urinal?: A Lot Help from another person bathing (including washing, rinsing, drying)?: A Lot Help from another person to put on and taking off regular upper body clothing?: A Little Help from another person to put on and taking off regular lower body clothing?: Total 6 Click Score: 15   End of Session Equipment Utilized During Treatment: Gait belt;Rolling walker Nurse Communication: Mobility status  Activity Tolerance:  Patient limited by fatigue Patient left: in chair;with call bell/phone within reach;with nursing/sitter in room  OT Visit Diagnosis: Unsteadiness on feet (R26.81)                Time: 9106-8166 OT Time Calculation (min): 23 min Charges:  OT General Charges $OT Visit: 1 Visit OT Evaluation $OT Eval Moderate Complexity: 1 Mod OT Treatments $Self Care/Home Management : 8-22 mins G-Codes:     [facility],  OTR/L [phone]   Lucille Passy M 09/14/2017, 1:26 PM

## 2017-09-14 NOTE — Anesthesia Preprocedure Evaluation (Signed)
Anesthesia Evaluation  Patient identified by MRN, date of birth, ID band Patient awake    Reviewed: Allergy & Precautions, NPO status , Patient's Chart, lab work & pertinent test results  Airway Mallampati: II  TM Distance: >3 FB Neck ROM: Full    Dental   Pulmonary sleep apnea , former smoker,    Pulmonary exam normal        Cardiovascular hypertension, Pt. on medications Normal cardiovascular exam+ dysrhythmias Atrial Fibrillation      Neuro/Psych    GI/Hepatic   Endo/Other    Renal/GU Renal InsufficiencyRenal disease     Musculoskeletal   Abdominal   Peds  Hematology   Anesthesia Other Findings   Reproductive/Obstetrics                             Anesthesia Physical Anesthesia Plan  ASA: III  Anesthesia Plan: General   Post-op Pain Management:    Induction: Intravenous  PONV Risk Score and Plan: 3 and Treatment may vary due to age or medical condition  Airway Management Planned: Mask  Additional Equipment:   Intra-op Plan:   Post-operative Plan:   Informed Consent: I have reviewed the patients History and Physical, chart, labs and discussed the procedure including the risks, benefits and alternatives for the proposed anesthesia with the patient or authorized representative who has indicated his/her understanding and acceptance.     Plan Discussed with: CRNA and Surgeon  Anesthesia Plan Comments:         Anesthesia Quick Evaluation

## 2017-09-14 NOTE — Progress Notes (Signed)
Patient ID: Gina [name], female   DOB: 1940/11/19, 77 y.o.   MRN: [id number]     Advanced Heart Failure Rounding Note  PCP-Cardiologist: Carlyle Dolly, MD   Subjective:    Transferred to [facility] 7/10.   S/P DC-CV with 7/11 conversion to NSR. Developed bradycardia so IV amio stopped and po amio started. Around 2200 on 7/11 she went back into A fib RVR.  She says that she definitely felt better in NSR.   This morning, she underwent repeat DCCV to NSR after milrinone turned down to 0.125.    I/Os even yesterday.  CVP 8-9 today.  Co-ox 68%.    Remains on norepi 2 mcg + milrinone 0.125 mcg. BUN/creatinine trending down.   Urine CX with E coli, now on ceftriaxone.    Objective:   Weight Range: 256 lb 9.9 oz (116.4 kg) Body mass index is 44.05 kg/m.   Vital Signs:   Temp:  [97.4 F (36.3 C)-98.6 F (37 C)] 97.5 F (36.4 C) (07/14 0400) Pulse Rate:  [25-177] 48 (07/14 0600) Resp:  [0-38] 17 (07/14 0600) BP: (92-147)/(62-132) 102/67 (07/14 0600) SpO2:  [76 %-99 %] 99 % (07/14 0600) Weight:  [256 lb 9.9 oz (116.4 kg)] 256 lb 9.9 oz (116.4 kg) (07/14 0500) Last BM Date: 09/12/17  Weight change: Filed Weights   09/12/17 0600 09/13/17 0500 09/14/17 0500  Weight: 236 lb 8.9 oz (107.3 kg) 252 lb 3.3 oz (114.4 kg) 256 lb 9.9 oz (116.4 kg)    Intake/Output:   Intake/Output Summary (Last 24 hours) at 09/14/2017 0824 Last data filed at 09/14/2017 0818 Gross per 24 hour  Intake 2209.57 ml  Output 2280 ml  Net -70.43 ml      Physical Exam   CVP 8-9 personally checked.  General: NAD Neck: JVP 10 cm, no thyromegaly or thyroid nodule.  Lungs: Clear to auscultation bilaterally with normal respiratory effort. CV: Lateral PMI.  Heart mildly tachy, irregular S1/S2, no S3/S4, 1/6 HSM LLSB.  1+ edema to knees bilaterally.   Abdomen: Soft, nontender, no hepatosplenomegaly, no distention.  Skin: Intact without lesions or rashes.  Neurologic: Alert and oriented x 3.  Psych: Normal  affect. Extremities: No clubbing or cyanosis.  HEENT: Normal.     Telemetry   Atrial fibrillation 110s => NSR 50s-60s, personally reviewed.   EKG    n/a  Labs    CBC Recent Labs    09/13/17 0356 09/14/17 0400  WBC 4.3 4.1  HGB 11.2* 11.0*  HCT 37.4 36.4  MCV 99.5 99.5  PLT 227 892   Basic Metabolic Panel Recent Labs    09/13/17 0356 09/14/17 0400  NA 139 139  K 3.4* 3.3*  CL 100 103  CO2 27 27  GLUCOSE 162* 168*  BUN 98* 86*  CREATININE 2.75* 2.44*  CALCIUM 8.0* 7.9*   Liver Function Tests Recent Labs    09/13/17 0356 09/14/17 0400  AST 37 30  ALT 103* 87*  ALKPHOS 50 50  BILITOT 0.6 0.7  PROT 4.8* 4.4*  ALBUMIN 2.5* 2.4*   No results for input(s): LIPASE, AMYLASE in the last 72 hours. Cardiac Enzymes No results for input(s): CKTOTAL, CKMB, CKMBINDEX, TROPONINI in the last 72 hours.  BNP: BNP (last 3 results) Recent Labs    09/02/17 1512  BNP >4,500.0*    ProBNP (last 3 results) No results for input(s): PROBNP in the last 8760 hours.   D-Dimer No results for input(s): DDIMER in the last 72 hours. Hemoglobin A1C No  results for input(s): HGBA1C in the last 72 hours. Fasting Lipid Panel No results for input(s): CHOL, HDL, LDLCALC, TRIG, CHOLHDL, LDLDIRECT in the last 72 hours. Thyroid Function Tests No results for input(s): TSH, T4TOTAL, T3FREE, THYROIDAB in the last 72 hours.  Invalid input(s): FREET3  Other results:   Imaging    No results found.   Medications:     Scheduled Medications: . apixaban  5 mg Oral BID  . Chlorhexidine Gluconate Cloth  6 each Topical Daily  . furosemide  60 mg Intravenous BID  . potassium chloride  20 mEq Oral Daily  . potassium chloride  40 mEq Oral Once  . sodium chloride flush  10-40 mL Intracatheter Q12H  . sodium chloride flush  3 mL Intravenous Q12H  . sodium chloride flush  3 mL Intravenous Q12H  . sodium chloride flush  3 mL Intravenous Q12H  . sodium chloride flush  3 mL  Intravenous Q12H  . sodium chloride flush  3 mL Intravenous Q12H    Infusions: . sodium chloride 0 mL (09/10/17 1748)  . sodium chloride    . sodium chloride    . [START ON 09/15/2017] sodium chloride    . sodium chloride    . sodium chloride    . amiodarone 30 mg/hr (09/14/17 0753)  . cefTRIAXone (ROCEPHIN)  IV Stopped (09/13/17 1850)  . milrinone 0.125 mcg/kg/min (09/14/17 0807)  . norepinephrine (LEVOPHED) Adult infusion 2 mcg/min (09/14/17 0753)    PRN Medications: sodium chloride, sodium chloride, acetaminophen, bisacodyl, ondansetron (ZOFRAN) IV, sodium chloride flush, sodium chloride flush, sodium chloride flush, sodium chloride flush, sodium chloride flush, sodium chloride flush, traMADol    Patient Profile   Gina [name] is a 77 year old with a history chronic systolic heart failure, anemia, htn, LBBB, renal artery stenosis, and OSA.   Transferred to [facility] on 7/10 with cardiogenic shock, Afib RVR, and volume overload.    Assessment/Plan   1. Acute on chronic systolic CHF: Echo (0/93) with EF 20%. Long-standing cardiomyopathy. Nonischemic based on cath in 2017. She had low output HF with co-ox 41%, CVP 17, AKI on CKD stage 3 ("cold and wet"). Exacerbation triggered by atrial fibrillation and also by being off Lasix for a number of days after hospitalization at [facility] for UTI.  Today, co-ox up to 67% on milrinone 0.25 + norepinephrine 2.  CVP 8-9 today. Unfortunately, back into afib after successful cardioversion Thursday.  She felt much better in NSR on 7/11, now back in NSR this morning.  - Milrinone decreased this morning to 0.125, will titrate down norepinephrine if BP remains stable post-DCCV.  Repeat co-ox in am.  - Continue Lasix 60 mg IV bid today and replace K.  - No ACEI/ARB/ARNI/digoxin/spironolactone with AKI.  - No beta blocker with low output.  - Has wide LBBB, ultimately needs CRT.   2. AKI on CKD stage 3: Suspect cardiorenal syndrome with low output  HF. No NSAID use.  With improved BP and cardiac output, creatinine starting to trend down slowly.  2.63 today.  3. Atrial fibrillation: Admitted with afib/RVR, has been in afib for about 6 wks. Has been on Eliquis for about 4 weeks without missing any doses. Cardioverted to NSR 7/11 but back in atrial fibrillation with RVR by 7/12. Repeat DCCV to NSR today after additional amiodarone loading.  - Continue amiodarone gtt today. - Continue Eliquis.  - If we cannot keep her in NSR with amiodarone, would consider AV nodal ablation with BiV pacing.  -  Need to check TSH, this does not appear to have resulted when ordered in the past.   - Check Mg today.  - Replace K.   4. Elevated LFTs: Suspect shock liver with low cardiac output. Follow CMET.  5. UTI: E coli. Continue ceftriaxone.  6. Deconditioning: PT/OT consults. Out of bed.   [name] 09/14/2017 8:24 AM

## 2017-09-14 NOTE — Transfer of Care (Signed)
Immediate Anesthesia Transfer of Care Note  Patient: Gina [name]  Procedure(s) Performed: CARDIOVERSION (N/A )  Patient Location: SICU  Anesthesia Type:General  Level of Consciousness: awake  Airway & Oxygen Therapy: Patient Spontanous Breathing and Patient connected to nasal cannula oxygen  Post-op Assessment: Report given to RN and Post -op Vital signs reviewed and stable  Post vital signs: Reviewed and stable  Last Vitals:  Vitals Value Taken Time  BP    Temp    Pulse 49 09/14/2017  8:20 AM  Resp 17 09/14/2017  8:20 AM  SpO2 97 % 09/14/2017  8:20 AM  Vitals shown include unvalidated device data.  Last Pain:  Vitals:   09/14/17 0400  TempSrc: Oral  PainSc: Asleep      Patients Stated Pain Goal: 0 (86/48/47 2072)  Complications: No apparent anesthesia complications

## 2017-09-14 NOTE — Plan of Care (Signed)
  Problem: Activity: Goal: Risk for activity intolerance will decrease Outcome: Progressing   Problem: Elimination: Goal: Will not experience complications related to urinary retention Outcome: Progressing   Problem: Pain Managment: Goal: General experience of comfort will improve Outcome: Progressing   Problem: Safety: Goal: Ability to remain free from injury will improve Outcome: Progressing   Problem: Skin Integrity: Goal: Risk for impaired skin integrity will decrease Outcome: Progressing   Problem: Activity: Goal: Capacity to carry out activities will improve Outcome: Progressing

## 2017-09-14 NOTE — Procedures (Signed)
Electrical Cardioversion Procedure Note Gina [name] [id number] Nov 02, 1940  Procedure: Electrical Cardioversion Indications:  Atrial Fibrillation  Procedure Details Consent: Risks of procedure as well as the alternatives and risks of each were explained to the (patient/caregiver).  Consent for procedure obtained. Time Out: Verified patient identification, verified procedure, site/side was marked, verified correct patient position, special equipment/implants available, medications/allergies/relevent history reviewed, required imaging and test results available.  Performed  Patient placed on cardiac monitor, pulse oximetry, supplemental oxygen as necessary.  Sedation given: Propofol per anesthesiology Pacer pads placed anterior and posterior chest.  Cardioverted 1 time(s).  Cardioverted at [place].  Evaluation Findings: Post procedure EKG shows: NSR Complications: None Patient did tolerate procedure well.   Gina [name] 09/14/2017, 8:23 AM

## 2017-09-14 NOTE — Anesthesia Postprocedure Evaluation (Signed)
Anesthesia Post Note  Patient: Gina [name]  Procedure(s) Performed: CARDIOVERSION (N/A )     Patient location during evaluation: PACU Anesthesia Type: General Level of consciousness: awake and alert Pain management: pain level controlled Vital Signs Assessment: post-procedure vital signs reviewed and stable Respiratory status: spontaneous breathing, nonlabored ventilation, respiratory function stable and patient connected to nasal cannula oxygen Cardiovascular status: blood pressure returned to baseline and stable Postop Assessment: no apparent nausea or vomiting Anesthetic complications: no    Last Vitals:  Vitals:   09/14/17 0900 09/14/17 1000  BP: 114/68 114/73  Pulse: (!) 58 61  Resp: 15 (!) 24  Temp:    SpO2: 99% 100%    Last Pain:  Vitals:   09/14/17 0810  TempSrc: Oral  PainSc:                  Deanne Bedgood DAVID

## 2017-09-15 ENCOUNTER — Ambulatory Visit: Payer: Medicare Other | Admitting: Cardiology

## 2017-09-15 ENCOUNTER — Encounter (HOSPITAL_COMMUNITY): Payer: Self-pay | Admitting: Cardiology

## 2017-09-15 ENCOUNTER — Encounter (HOSPITAL_COMMUNITY)
Admission: EM | Disposition: A | Discharge status: Skilled Nursing Facility | Payer: Self-pay | Source: Home / Self Care | Attending: Cardiology

## 2017-09-15 LAB — CBC
HCT: 37.8 % (ref 36.0–46.0)
Hemoglobin: 11.3 g/dL — ABNORMAL LOW (ref 12.0–15.0)
MCH: 29.9 pg (ref 26.0–34.0)
MCHC: 29.9 g/dL — ABNORMAL LOW (ref 30.0–36.0)
MCV: 100 fL (ref 78.0–100.0)
Platelets: 225 10*3/uL (ref 150–400)
RBC: 3.78 MIL/uL — ABNORMAL LOW (ref 3.87–5.11)
RDW: 18.7 % — ABNORMAL HIGH (ref 11.5–15.5)
WBC: 4.2 10*3/uL (ref 4.0–10.5)

## 2017-09-15 LAB — COMPREHENSIVE METABOLIC PANEL
ALT: 77 U/L — ABNORMAL HIGH (ref 0–44)
AST: 26 U/L (ref 15–41)
Albumin: 2.5 g/dL — ABNORMAL LOW (ref 3.5–5.0)
Alkaline Phosphatase: 50 U/L (ref 38–126)
Anion gap: 10 (ref 5–15)
BUN: 75 mg/dL — ABNORMAL HIGH (ref 8–23)
CO2: 29 mmol/L (ref 22–32)
Calcium: 8.2 mg/dL — ABNORMAL LOW (ref 8.9–10.3)
Chloride: 105 mmol/L (ref 98–111)
Creatinine, Ser: 2.25 mg/dL — ABNORMAL HIGH (ref 0.44–1.00)
GFR calc Af Amer: 23 mL/min — ABNORMAL LOW (ref >60)
GFR calc non Af Amer: 20 mL/min — ABNORMAL LOW (ref >60)
Glucose, Bld: 107 mg/dL — ABNORMAL HIGH (ref 70–99)
Potassium: 3.3 mmol/L — ABNORMAL LOW (ref 3.5–5.1)
Sodium: 144 mmol/L (ref 135–145)
Total Bilirubin: 0.7 mg/dL (ref 0.3–1.2)
Total Protein: 4.9 g/dL — ABNORMAL LOW (ref 6.5–8.1)

## 2017-09-15 LAB — COOXEMETRY PANEL
Carboxyhemoglobin: 1.6 % — ABNORMAL HIGH (ref 0.5–1.5)
Methemoglobin: 1.4 % (ref 0.0–1.5)
O2 Saturation: 69.7 %
Total hemoglobin: 11.4 g/dL — ABNORMAL LOW (ref 12.0–16.0)

## 2017-09-15 LAB — MAGNESIUM: Magnesium: 2.2 mg/dL (ref 1.7–2.4)

## 2017-09-15 SURGERY — CARDIOVERSION
Anesthesia: General

## 2017-09-15 MED ORDER — POTASSIUM CHLORIDE CRYS ER 20 MEQ PO TBCR
40.0000 meq | EXTENDED_RELEASE_TABLET | Freq: Once | ORAL | Status: AC
  Administered 2017-09-15: 40 meq via ORAL
  Filled 2017-09-15: qty 2

## 2017-09-15 MED ORDER — POTASSIUM CHLORIDE CRYS ER 20 MEQ PO TBCR
40.0000 meq | EXTENDED_RELEASE_TABLET | Freq: Every day | ORAL | Status: DC
  Administered 2017-09-15 – 2017-09-23 (×9): 40 meq via ORAL
  Filled 2017-09-15 (×10): qty 2

## 2017-09-15 MED ORDER — SPIRONOLACTONE 12.5 MG HALF TABLET
12.5000 mg | ORAL_TABLET | Freq: Every day | ORAL | Status: DC
  Administered 2017-09-15: 12.5 mg via ORAL
  Filled 2017-09-15 (×2): qty 1

## 2017-09-15 NOTE — Progress Notes (Signed)
CSW continuing to follow for SNF placement when stable  Jorge Ny, [place] Social Worker [phone]

## 2017-09-15 NOTE — Progress Notes (Signed)
Patient ID: Gina [name], female   DOB: 11/12/1940, 77 y.o.   MRN: [id number]     Advanced Heart Failure Rounding Note  PCP-Cardiologist: Gina Dolly, MD   Subjective:    Transferred to [facility] 7/10.   S/P DC-CV with 7/11 conversion to NSR. Developed bradycardia so IV amio stopped and po amio started. Around 2200 on 7/11 she went back into A fib RVR.   Repeat DCCV to NSR 7/14, remains in NSR today.   I/Os negative with weight down.  CVP 11.  Co-ox 70%.    Remains on milrinone 0.125 mcg, off norepinephrine. BUN/creatinine trending down.   Urine CX with E coli, now on ceftriaxone.    Objective:   Weight Range: 253 lb 8.5 oz (115 kg) Body mass index is 43.52 kg/m.   Vital Signs:   Temp:  [97.4 F (36.3 C)-98.3 F (36.8 C)] 97.6 F (36.4 C) (07/15 0300) Pulse Rate:  [52-70] 69 (07/15 0700) Resp:  [12-44] 19 (07/15 0700) BP: (92-136)/(54-82) 129/81 (07/15 0700) SpO2:  [88 %-100 %] 88 % (07/15 0700) Weight:  [253 lb 8.5 oz (115 kg)] 253 lb 8.5 oz (115 kg) (07/15 0500) Last BM Date: 09/14/17  Weight change: Filed Weights   09/13/17 0500 09/14/17 0500 09/15/17 0500  Weight: 252 lb 3.3 oz (114.4 kg) 256 lb 9.9 oz (116.4 kg) 253 lb 8.5 oz (115 kg)    Intake/Output:   Intake/Output Summary (Last 24 hours) at 09/15/2017 0732 Last data filed at 09/15/2017 0600 Gross per 24 hour  Intake 1751.04 ml  Output 2350 ml  Net -598.96 ml      Physical Exam   CVP 11 personally checked.  General: NAD Neck: JVP 10 cm, no thyromegaly or thyroid nodule.  Lungs: Clear to auscultation bilaterally with normal respiratory effort. CV: Lateral PMI.  Heart regular S1/S2, no S3/S4, 1/6 HSM LLSB.  1+ edema ankles.   Abdomen: Soft, nontender, no hepatosplenomegaly, no distention.  Skin: Intact without lesions or rashes.  Neurologic: Alert and oriented x 3.  Psych: Normal affect. Extremities: No clubbing or cyanosis.  HEENT: Normal.     Telemetry   NSR 60s, personally reviewed.    EKG    n/a  Labs    CBC Recent Labs    09/14/17 0400 09/15/17 0422  WBC 4.1 4.2  HGB 11.0* 11.3*  HCT 36.4 37.8  MCV 99.5 100.0  PLT 220 696   Basic Metabolic Panel Recent Labs    09/14/17 0400 09/14/17 0822 09/15/17 0422  NA 139  --  144  K 3.3*  --  3.3*  CL 103  --  105  CO2 27  --  29  GLUCOSE 168*  --  107*  BUN 86*  --  75*  CREATININE 2.44*  --  2.25*  CALCIUM 7.9*  --  8.2*  MG  --  2.4 2.2   Liver Function Tests Recent Labs    09/14/17 0400 09/15/17 0422  AST 30 26  ALT 87* 77*  ALKPHOS 50 50  BILITOT 0.7 0.7  PROT 4.4* 4.9*  ALBUMIN 2.4* 2.5*   No results for input(s): LIPASE, AMYLASE in the last 72 hours. Cardiac Enzymes No results for input(s): CKTOTAL, CKMB, CKMBINDEX, TROPONINI in the last 72 hours.  BNP: BNP (last 3 results) Recent Labs    09/02/17 1512  BNP >4,500.0*    ProBNP (last 3 results) No results for input(s): PROBNP in the last 8760 hours.   D-Dimer No results for input(s): DDIMER  in the last 72 hours. Hemoglobin A1C No results for input(s): HGBA1C in the last 72 hours. Fasting Lipid Panel No results for input(s): CHOL, HDL, LDLCALC, TRIG, CHOLHDL, LDLDIRECT in the last 72 hours. Thyroid Function Tests Recent Labs    09/14/17 0900  TSH 3.162    Other results:   Imaging    No results found.   Medications:     Scheduled Medications: . apixaban  5 mg Oral BID  . Chlorhexidine Gluconate Cloth  6 each Topical Daily  . furosemide  60 mg Intravenous BID  . potassium chloride  40 mEq Oral Daily  . potassium chloride  40 mEq Oral Once  . sodium chloride flush  10-40 mL Intracatheter Q12H  . sodium chloride flush  3 mL Intravenous Q12H  . spironolactone  12.5 mg Oral Daily    Infusions: . sodium chloride    . sodium chloride    . amiodarone 30 mg/hr (09/15/17 0600)  . cefTRIAXone (ROCEPHIN)  IV 1 g (09/14/17 1652)    PRN Medications: sodium chloride, acetaminophen, bisacodyl, MUSCLE RUB,  ondansetron (ZOFRAN) IV, sodium chloride, sodium chloride flush, sodium chloride flush, traMADol    Patient Profile   Gina [name] is a 77 year old with a history chronic systolic heart failure, anemia, htn, LBBB, renal artery stenosis, and OSA.   Transferred to [facility] on 7/10 with cardiogenic shock, Afib RVR, and volume overload.  Assessment/Plan   1. Acute on chronic systolic CHF: Echo (8/14) with EF 20%. Long-standing cardiomyopathy. Nonischemic based on cath in 2017. She had low output HF with co-ox 41%, CVP 17, AKI on CKD stage 3 ("cold and wet"). Exacerbation triggered by atrial fibrillation and also by being off Lasix for a number of days after hospitalization at [facility] for UTI.  Today, co-ox up to 70% on milrinone 0.125, off norepinephrine.  CVP 11 today. She is in NSR after DCCV 7/14.   - Stop milrinone today.  - Continue Lasix 60 mg IV bid today and replace K.  - Add spironolactone 12.5 daily today.  - If creatinine continues to trend down, add digoxin tomorrow.  - No beta blocker with low output.  - Has wide LBBB, ultimately needs CRT.  Will likely need to be done in a month when anticoagulation can be held post-DCCV.  2. AKI on CKD stage 3: Suspect cardiorenal syndrome with low output HF. No NSAID use.  With improved BP and cardiac output, creatinine starting to trend down, 2.25 today.  3. Atrial fibrillation: Admitted with afib/RVR, had been in afib for about 6 wks. TSH normal.  Had been on Eliquis for about 4 weeks without missing any doses. Cardioverted to NSR 7/11 but back in atrial fibrillation with RVR by 7/12. Repeat DCCV to NSR in 7/14 after additional amiodarone loading.  - Continue amiodarone gtt for today, po tomorrow. - Continue Eliquis.  - Based on CASTLE-HF, would consider afib ablation in the future.   - Replace K.   4. Elevated LFTs: Suspect shock liver with low cardiac output. LFTs trending down.  5. UTI: E coli. Continue ceftriaxone.  6.  Deconditioning: PT/OT consults. Out of bed. Will need SNF at discharge, consult social work.   Gina [name] 09/15/2017 7:32 AM

## 2017-09-15 NOTE — Care Management Note (Signed)
Case Management Note Previous Note Created by [facility] Patient Details  Name: Gina [name] MRN: [id number] Date of Birth: 05-11-1940  Subjective/Objective:   CHF. From home with husband. Ind with ADL's. Has rollator walker. Active with [facility] home health for RN and PT.  Currently on room.       Action/Plan: DC home with home health. Santiago Glad of [facility] aware of admission and will follow for orders.   Expected Discharge Date:     09/04/2017             Expected Discharge Plan:  [place]  In-House Referral:     Discharge planning Services  CM Consult  Post Acute Care Choice:  Home Health, Resumption of Svcs/PTA Provider Choice offered to:  Patient  DME Arranged:    DME Agency:     HH Arranged:  RN, PT HH Agency:  [place]  Status of Service:  Completed, signed off  If discussed at [place] of Stay Meetings, dates discussed:    Additional Comments: 09/15/2017 Pt in ICU at [facility].  SNF reccommended - CSW following Maryclare Labrador, RN 09/15/2017, 2:46 PM

## 2017-09-16 ENCOUNTER — Inpatient Hospital Stay (HOSPITAL_COMMUNITY): Payer: Medicare Other

## 2017-09-16 DIAGNOSIS — I509 Heart failure, unspecified: Secondary | ICD-10-CM

## 2017-09-16 DIAGNOSIS — R0600 Dyspnea, unspecified: Secondary | ICD-10-CM

## 2017-09-16 DIAGNOSIS — B9629 Other Escherichia coli [E. coli] as the cause of diseases classified elsewhere: Secondary | ICD-10-CM

## 2017-09-16 DIAGNOSIS — I4891 Unspecified atrial fibrillation: Secondary | ICD-10-CM

## 2017-09-16 DIAGNOSIS — R0602 Shortness of breath: Secondary | ICD-10-CM

## 2017-09-16 DIAGNOSIS — J9601 Acute respiratory failure with hypoxia: Secondary | ICD-10-CM

## 2017-09-16 DIAGNOSIS — D62 Acute posthemorrhagic anemia: Secondary | ICD-10-CM

## 2017-09-16 DIAGNOSIS — J90 Pleural effusion, not elsewhere classified: Secondary | ICD-10-CM

## 2017-09-16 DIAGNOSIS — N390 Urinary tract infection, site not specified: Secondary | ICD-10-CM

## 2017-09-16 DIAGNOSIS — Z1612 Extended spectrum beta lactamase (ESBL) resistance: Secondary | ICD-10-CM

## 2017-09-16 LAB — COMPREHENSIVE METABOLIC PANEL
ALT: 61 U/L — ABNORMAL HIGH (ref 0–44)
AST: 19 U/L (ref 15–41)
Albumin: 2.6 g/dL — ABNORMAL LOW (ref 3.5–5.0)
Alkaline Phosphatase: 52 U/L (ref 38–126)
Anion gap: 11 (ref 5–15)
BUN: 67 mg/dL — ABNORMAL HIGH (ref 8–23)
CO2: 26 mmol/L (ref 22–32)
Calcium: 8.1 mg/dL — ABNORMAL LOW (ref 8.9–10.3)
Chloride: 106 mmol/L (ref 98–111)
Creatinine, Ser: 2.12 mg/dL — ABNORMAL HIGH (ref 0.44–1.00)
GFR calc Af Amer: 25 mL/min — ABNORMAL LOW (ref >60)
GFR calc non Af Amer: 22 mL/min — ABNORMAL LOW (ref >60)
Glucose, Bld: 129 mg/dL — ABNORMAL HIGH (ref 70–99)
Potassium: 3.8 mmol/L (ref 3.5–5.1)
Sodium: 143 mmol/L (ref 135–145)
Total Bilirubin: 0.5 mg/dL (ref 0.3–1.2)
Total Protein: 5 g/dL — ABNORMAL LOW (ref 6.5–8.1)

## 2017-09-16 LAB — COOXEMETRY PANEL
Carboxyhemoglobin: 2 % — ABNORMAL HIGH (ref 0.5–1.5)
Carboxyhemoglobin: 1.4 % (ref 0.5–1.5)
Methemoglobin: 0.9 % (ref 0.0–1.5)
Methemoglobin: 1.4 % (ref 0.0–1.5)
O2 Saturation: 73.2 %
O2 Saturation: 71.1 %
Total hemoglobin: 11.8 g/dL — ABNORMAL LOW (ref 12.0–16.0)
Total hemoglobin: 11.8 g/dL — ABNORMAL LOW (ref 12.0–16.0)

## 2017-09-16 LAB — MAGNESIUM
Magnesium: 2 mg/dL (ref 1.7–2.4)
Magnesium: 2 mg/dL (ref 1.7–2.4)

## 2017-09-16 LAB — BASIC METABOLIC PANEL
Anion gap: 14 (ref 5–15)
BUN: 68 mg/dL — ABNORMAL HIGH (ref 8–23)
CO2: 25 mmol/L (ref 22–32)
Calcium: 8.4 mg/dL — ABNORMAL LOW (ref 8.9–10.3)
Chloride: 102 mmol/L (ref 98–111)
Creatinine, Ser: 2.23 mg/dL — ABNORMAL HIGH (ref 0.44–1.00)
GFR calc Af Amer: 23 mL/min — ABNORMAL LOW (ref >60)
GFR calc non Af Amer: 20 mL/min — ABNORMAL LOW (ref >60)
Glucose, Bld: 198 mg/dL — ABNORMAL HIGH (ref 70–99)
Potassium: 4.3 mmol/L (ref 3.5–5.1)
Sodium: 141 mmol/L (ref 135–145)

## 2017-09-16 MED ORDER — AMIODARONE HCL IN DEXTROSE 360-4.14 MG/200ML-% IV SOLN
60.0000 mg/h | INTRAVENOUS | Status: DC
  Administered 2017-09-16 (×2): 60 mg/h via INTRAVENOUS
  Filled 2017-09-16: qty 200

## 2017-09-16 MED ORDER — AMIODARONE LOAD VIA INFUSION
150.0000 mg | Freq: Once | INTRAVENOUS | Status: AC
  Administered 2017-09-16: 150 mg via INTRAVENOUS
  Filled 2017-09-16: qty 83.34

## 2017-09-16 MED ORDER — AMIODARONE HCL IN DEXTROSE 360-4.14 MG/200ML-% IV SOLN
INTRAVENOUS | Status: AC
  Administered 2017-09-16: 60 mg/h via INTRAVENOUS
  Filled 2017-09-16: qty 200

## 2017-09-16 MED ORDER — AMIODARONE HCL IN DEXTROSE 360-4.14 MG/200ML-% IV SOLN
60.0000 mg/h | INTRAVENOUS | Status: DC
  Administered 2017-09-17: 60 mg/h via INTRAVENOUS
  Administered 2017-09-17 (×2): 30 mg/h via INTRAVENOUS
  Administered 2017-09-17 – 2017-09-18 (×2): 60 mg/h via INTRAVENOUS
  Filled 2017-09-16 (×5): qty 200

## 2017-09-16 MED ORDER — PIPERACILLIN-TAZOBACTAM 3.375 G IVPB
3.3750 g | Freq: Three times a day (TID) | INTRAVENOUS | Status: DC
  Administered 2017-09-16 – 2017-09-17 (×2): 3.375 g via INTRAVENOUS
  Filled 2017-09-16 (×3): qty 50

## 2017-09-16 MED ORDER — AMIODARONE HCL 200 MG PO TABS
400.0000 mg | ORAL_TABLET | Freq: Two times a day (BID) | ORAL | Status: DC
  Administered 2017-09-16: 400 mg via ORAL
  Filled 2017-09-16: qty 2

## 2017-09-16 MED ORDER — POTASSIUM CHLORIDE CRYS ER 20 MEQ PO TBCR
40.0000 meq | EXTENDED_RELEASE_TABLET | ORAL | Status: AC
  Administered 2017-09-16: 40 meq via ORAL
  Filled 2017-09-16: qty 2

## 2017-09-16 MED ORDER — SPIRONOLACTONE 25 MG PO TABS
25.0000 mg | ORAL_TABLET | Freq: Every day | ORAL | Status: DC
  Administered 2017-09-16 – 2017-09-23 (×8): 25 mg via ORAL
  Filled 2017-09-16 (×8): qty 1

## 2017-09-16 MED ORDER — FUROSEMIDE 10 MG/ML IJ SOLN
80.0000 mg | Freq: Two times a day (BID) | INTRAMUSCULAR | Status: DC
  Administered 2017-09-16 – 2017-09-17 (×3): 80 mg via INTRAVENOUS
  Filled 2017-09-16 (×3): qty 8

## 2017-09-16 MED ORDER — FUROSEMIDE 10 MG/ML IJ SOLN
40.0000 mg | INTRAMUSCULAR | Status: AC
  Administered 2017-09-16: 40 mg via INTRAVENOUS
  Filled 2017-09-16: qty 4

## 2017-09-16 MED ORDER — IPRATROPIUM BROMIDE 0.02 % IN SOLN: 0.5000 mg | Freq: Four times a day (QID) | RESPIRATORY_TRACT | Status: DC | PRN

## 2017-09-16 MED ORDER — DIGOXIN 125 MCG PO TABS
0.1250 mg | ORAL_TABLET | Freq: Every day | ORAL | Status: DC
  Administered 2017-09-16 – 2017-09-17 (×2): 0.125 mg via ORAL
  Filled 2017-09-16 (×2): qty 1

## 2017-09-16 NOTE — Progress Notes (Addendum)
Pharmacy Antibiotic Note  Gina [name] is a 77 y.o. female admitted on 09/02/2017 with pneumonia.  Pharmacy has been consulted for Zosyn dosing.  Pt with allergy to cephalexin, but tolerated a 7 day course of ceftriaxone (ended today).  Plan: Zosyn 3.375g IV q8h (4 hour infusion).  F/u cultures, renal function, and clinical course.  Height: 5\' 4"  (162.6 cm) Weight: 192 lb 3.2 oz (87.2 kg) IBW/kg (Calculated) : 54.7  Temp (24hrs), Avg:98.4 F (36.9 C), Min:97.8 F (36.6 C), Max:98.9 F (37.2 C)  Recent Labs  Lab 09/11/17 0420 09/12/17 0453 09/13/17 0356 09/14/17 0400 09/15/17 0422 09/16/17 0200  WBC 4.9 5.8 4.3 4.1 4.2  --   CREATININE 2.90* 2.77* 2.75* 2.44* 2.25* 2.12*    Estimated Creatinine Clearance: 24.1 mL/min (A) (by C-G formula based on SCr of 2.12 mg/dL (H)).    Allergies  Allergen Reactions  . Cephalexin     REACTION: Rash to arms/legs    Antimicrobials this admission:  CTX 7/10>>7/16  Dose adjustments this admission:  Microbiology results: 7/10 urine> ecoli + proteus  Thank you for allowing pharmacy to be a part of this patient's care.  Marguerite Olea, [facility] Clinical Pharmacist Phone [phone]  09/16/2017 8:37 PM

## 2017-09-16 NOTE — Progress Notes (Addendum)
Physical Therapy Treatment Patient Details Name: Gina [name] MRN: [id number] DOB: 06-26-1940 Today's Date: 09/16/2017    History of Present Illness 77 yo transfer from [facility] 7/10 with cardiogenic shock, Afib and volume overload s/p DCCV 7/11 with return to Afib post procedur.  Underwent DCCV again 7/14 PMHx: CHF, anemia, HTN, Left BBB, OSA, renal artery stenosis, CKD, CAD    PT Comments    Pt in chair on arrival and able to progress activity to include pivot to [facility], walking in room with RW and chair follow. Pt with SpO2 93-99% on 4L throughout session with report of 6/10 fatigue with activity and 3/4 DOE. Pt educated for importance of mobility and progression with HEP performed.   HR 75-89     Follow Up Recommendations  SNF;Supervision/Assistance - 24 hour     Equipment Recommendations  None recommended by PT    Recommendations for Other Services       Precautions / Restrictions Precautions Precautions: Fall Restrictions Weight Bearing Restrictions: No    Mobility  Bed Mobility               General bed mobility comments: In recliner upon arrival  Transfers Overall transfer level: Needs assistance Equipment used: Rolling walker (2 wheeled) Transfers: Sit to/from [facility] Sit to Stand: Min assist;+2 physical assistance;+2 safety/equipment Stand pivot transfers: Min assist;+2 safety/equipment       General transfer comment: Min A +2 to power up into standing with cues for hand placement and safety with assist to clear bil hips from [facility]. Standing x 4 trials total. Pivot recliner to [facility] with assist of RW  Ambulation/Gait Ambulation/Gait assistance: Min assist;+2 safety/equipment Gait Distance (Feet): 12 Feet Assistive device: Rolling walker (2 wheeled) Gait Pattern/deviations: Step-through pattern   Gait velocity interpretation: <1.8 ft/sec, indicate of risk for recurrent falls General Gait Details: pt circumducting LLE with  each step with pt stating she uses a rollator in front of her normally and had never been aware of deficit. cues for posture and position in RW with chair follow. pt walked 12' then 8' with seated rest. Encouragement to maximize distance and function   Stairs             Wheelchair Mobility    Modified Rankin (Stroke Patients Only)       Balance Overall balance assessment: Needs assistance Sitting-balance support: Feet supported Sitting balance-Leahy Scale: Fair     Standing balance support: Bilateral upper extremity supported;During functional activity Standing balance-Leahy Scale: Poor Standing balance comment: reliant on UE support                             Cognition Arousal/Alertness: Awake/alert Behavior During Therapy: WFL for tasks assessed/performed Overall Cognitive Status: Within Functional Limits for tasks assessed                                        Exercises General Exercises - Lower Extremity Long Arc Quad: AROM;15 reps;Seated;Both Hip Flexion/Marching: AROM;10 reps;Seated;AAROM;Right;Left(AAROM on LLE)    General Comments General comments (skin integrity, edema, etc.): HR max 100, SpO2 >94% on 4L O2, and RR 20-30s      Pertinent Vitals/Pain Pain Assessment: Faces Pain Score: 6  Faces Pain Scale: Hurts even more Pain Location: Lower back Pain Descriptors / Indicators: Aching;Sore Pain Intervention(s): Limited activity within patient's  tolerance;Repositioned;Monitored during session    Home Living                      Prior Function            PT Goals (current goals can now be found in the care plan section) Acute Rehab PT Goals Patient Stated Goal: To get back to independence and taking care of self  Progress towards PT goals: Progressing toward goals    Frequency           PT Plan Current plan remains appropriate    Co-evaluation PT/OT/SLP Co-Evaluation/Treatment: Yes Reason for  Co-Treatment: For patient/therapist safety PT goals addressed during session: Mobility/safety with mobility;Balance;Proper use of DME        AM-PAC PT "6 Clicks" Daily Activity  Outcome Measure  Difficulty turning over in bed (including adjusting bedclothes, sheets and blankets)?: Unable Difficulty moving from lying on back to sitting on the side of the bed? : Unable Difficulty sitting down on and standing up from a chair with arms (e.g., wheelchair, bedside commode, etc,.)?: Unable Help needed moving to and from a bed to chair (including a wheelchair)?: A Lot Help needed walking in hospital room?: A Little Help needed climbing 3-5 steps with a railing? : Total 6 Click Score: 9    End of Session Equipment Utilized During Treatment: Gait belt;Oxygen Activity Tolerance: Patient tolerated treatment well Patient left: in chair;with call bell/phone within reach;with chair alarm set;with nursing/sitter in room Nurse Communication: Mobility status;Precautions PT Visit Diagnosis: Unsteadiness on feet (R26.81);Other abnormalities of gait and mobility (R26.89);Muscle weakness (generalized) (M62.81)     Time: 3354-5625 PT Time Calculation (min) (ACUTE ONLY): 32 min  Charges:  $Gait Training: 8-22 mins                    G Codes:       Elwyn Reach, PT [phone]    [place] 09/16/2017, 12:57 PM

## 2017-09-16 NOTE — Progress Notes (Signed)
Occupational Therapy Treatment Patient Details Name: Gina [name] MRN: [id number] DOB: 1940-05-13 Today's Date: 09/16/2017    History of present illness 77 yo transfer from [facility] 7/10 with cardiogenic shock, Afib and volume overload s/p DCCV 7/11 with return to Afib post procedur.  Underwent DCCV again 7/14 PMHx: CHF, anemia, HTN, Left BBB, OSA, renal artery stenosis, CKD, CAD   OT comments  Return for second visit to address and education pt on UE exercises with theraband. Providing handout and educational demonstration for UE exercises with level 1 theraband. Pt performing 5 reps at bed level and demonstrating fatigue. Providing VCs for breathing with exercises. Continue to recommend dc to SNF and will continue to follow acutely as admitted.    Follow Up Recommendations  SNF    Equipment Recommendations  None recommended by OT    Recommendations for Other Services      Precautions / Restrictions Precautions Precautions: Fall Restrictions Weight Bearing Restrictions: No       Mobility Bed Mobility                  Transfers                      Balance Overall balance assessment: Needs assistance Sitting-balance support: Feet supported;Bilateral upper extremity supported Sitting balance-Leahy Scale: Poor     Standing balance support: Bilateral upper extremity supported;During functional activity Standing balance-Leahy Scale: Poor Standing balance comment: reliant on UE support                            ADL either performed or assessed with clinical judgement   ADL Overall ADL's : Needs assistance/impaired                                       General ADL Comments: Focused session on UE exercises with theraband at bed level.      Vision       Perception     Praxis      Cognition Arousal/Alertness: Awake/alert Behavior During Therapy: WFL for tasks assessed/performed Overall Cognitive Status:  Within Functional Limits for tasks assessed                                          Exercises Exercises: General Upper Extremity General Exercises - Upper Extremity Shoulder Flexion: AROM;Both;5 reps;Supine;Theraband;Limitations Theraband Level (Shoulder Flexion): Level 1 (Yellow) Shoulder Flexion Limitations: Decreased LUE ROM Shoulder Extension: AROM;Both;5 reps;Supine;Theraband Theraband Level (Shoulder Extension): Level 1 (Yellow) Shoulder Horizontal ABduction: AROM;5 reps;Supine;Theraband Theraband Level (Shoulder Horizontal Abduction): Level 1 (Yellow) Shoulder Horizontal ADduction: AROM;Both;5 reps;Supine;Theraband Theraband Level (Shoulder Horizontal Adduction): Level 1 (Yellow) Elbow Flexion: AROM;Both;5 reps;Supine;Other (comment);Theraband(Elevated HOB) Theraband Level (Elbow Flexion): Level 1 (Yellow) Elbow Extension: AROM;Both;5 reps;Supine;Theraband Theraband Level (Elbow Extension): Level 1 (Yellow)   Shoulder Instructions       General Comments Family (husband and niece) present throughout session    Pertinent Vitals/ Pain       Pain Assessment: Faces Faces Pain Scale: Hurts little more Pain Location: Lower back Pain Descriptors / Indicators: Aching;Sore Pain Intervention(s): Monitored during session;Limited activity within patient's tolerance;Repositioned  Home Living  Prior Functioning/Environment              Frequency  Min 2X/week        Progress Toward Goals  OT Goals(current goals can now be found in the care plan section)  Progress towards OT goals: Progressing toward goals  Acute Rehab OT Goals Patient Stated Goal: To get back to independence and taking care of self  OT Goal Formulation: With patient Time For Goal Achievement: 09/28/17 Potential to Achieve Goals: Good ADL Goals Pt Will Perform Grooming: with min assist;standing Pt Will Perform Upper Body  Bathing: with supervision;with set-up;sitting Pt Will Perform Lower Body Bathing: with min assist;sit to/from stand Pt Will Perform Upper Body Dressing: with set-up;with supervision;sitting Pt Will Perform Lower Body Dressing: with min assist;sit to/from stand Pt Will Transfer to Toilet: with min assist;ambulating;regular height toilet;bedside commode;grab bars Pt Will Perform Toileting - Clothing Manipulation and hygiene: with min assist;sit to/from stand Pt/caregiver will Perform Home Exercise Program: Increased strength;Right Upper extremity;Left upper extremity;With theraband;With Supervision;With written HEP provided  Plan Discharge plan remains appropriate    Co-evaluation                 AM-PAC PT "6 Clicks" Daily Activity     Outcome Measure   Help from another person eating meals?: None Help from another person taking care of personal grooming?: A Little Help from another person toileting, which includes using toliet, bedpan, or urinal?: A Lot Help from another person bathing (including washing, rinsing, drying)?: A Lot Help from another person to put on and taking off regular upper body clothing?: A Little Help from another person to put on and taking off regular lower body clothing?: Total 6 Click Score: 15    End of Session Equipment Utilized During Treatment: Rolling walker;Other (comment)(Level  theraband)  OT Visit Diagnosis: Unsteadiness on feet (R26.81)   Activity Tolerance Patient tolerated treatment well;Patient limited by fatigue   Patient Left with call bell/phone within reach;in bed;with family/visitor present   Nurse Communication Mobility status        Time: 9379-0240 OT Time Calculation (min): 13 min  Charges: OT General Charges $OT Visit: 1 Visit OT Treatments $Therapeutic Activity: 8-22 mins  [place], OTR/L Acute Rehab Pager: [phone] Office: [place] 09/16/2017, 5:36 PM

## 2017-09-16 NOTE — Progress Notes (Signed)
Patient ID: Gina [name], female   DOB: 1940-04-04, 77 y.o.   MRN: [id number]     Advanced Heart Failure Rounding Note  PCP-Cardiologist: Carlyle Dolly, MD   Subjective:    Transferred to [facility] 7/10.   S/P DC-CV with 7/11 conversion to NSR. Developed bradycardia so IV amio stopped and po amio started. Around 2200 on 7/11 she went back into A fib RVR.   Repeat DCCV to NSR 7/14, remains in NSR today.   I/Os negative with weight down.  CVP 13.  Co-ox 73%.    Now off milrinone. BUN/creatinine trending down.   Urine CX with E coli, now on ceftriaxone.    Objective:   Weight Range: 192 lb 3.2 oz (87.2 kg) Body mass index is 32.99 kg/m.   Vital Signs:   Temp:  [97.8 F (36.6 C)-98.9 F (37.2 C)] 98.9 F (37.2 C) (07/16 0728) Pulse Rate:  [57-72] 65 (07/16 0700) Resp:  [15-31] 23 (07/16 0700) BP: (110-131)/(63-85) 121/67 (07/16 0700) SpO2:  [92 %-99 %] 96 % (07/16 0700) Weight:  [192 lb 3.2 oz (87.2 kg)] 192 lb 3.2 oz (87.2 kg) (07/16 0600) Last BM Date: 09/15/17  Weight change: Filed Weights   09/14/17 0500 09/15/17 0500 09/16/17 0600  Weight: 256 lb 9.9 oz (116.4 kg) 253 lb 8.5 oz (115 kg) 192 lb 3.2 oz (87.2 kg)    Intake/Output:   Intake/Output Summary (Last 24 hours) at 09/16/2017 0737 Last data filed at 09/16/2017 0700 Gross per 24 hour  Intake 1259.05 ml  Output 2301 ml  Net -1041.95 ml      Physical Exam   CVP 13 personally checked.  General: NAD Neck: JVP 10-11 cm, no thyromegaly or thyroid nodule.  Lungs: Clear to auscultation bilaterally with normal respiratory effort. CV: Nondisplaced PMI.  Heart regular S1/S2 with paradoxical S2 split, no S3/S4, 1/6 HSM LLSB.  1+ edema 1/2 to knees bilaterally.   Abdomen: Soft, nontender, no hepatosplenomegaly, no distention.  Skin: Intact without lesions or rashes.  Neurologic: Alert and oriented x 3.  Psych: Normal affect. Extremities: No clubbing or cyanosis.  HEENT: Normal.      Telemetry   NSR 60s,  personally reviewed.   EKG    n/a  Labs    CBC Recent Labs    09/14/17 0400 09/15/17 0422  WBC 4.1 4.2  HGB 11.0* 11.3*  HCT 36.4 37.8  MCV 99.5 100.0  PLT 220 564   Basic Metabolic Panel Recent Labs    09/15/17 0422 09/16/17 0200  NA 144 143  K 3.3* 3.8  CL 105 106  CO2 29 26  GLUCOSE 107* 129*  BUN 75* 67*  CREATININE 2.25* 2.12*  CALCIUM 8.2* 8.1*  MG 2.2 2.0   Liver Function Tests Recent Labs    09/15/17 0422 09/16/17 0200  AST 26 19  ALT 77* 61*  ALKPHOS 50 52  BILITOT 0.7 0.5  PROT 4.9* 5.0*  ALBUMIN 2.5* 2.6*   No results for input(s): LIPASE, AMYLASE in the last 72 hours. Cardiac Enzymes No results for input(s): CKTOTAL, CKMB, CKMBINDEX, TROPONINI in the last 72 hours.  BNP: BNP (last 3 results) Recent Labs    09/02/17 1512  BNP >4,500.0*    ProBNP (last 3 results) No results for input(s): PROBNP in the last 8760 hours.   D-Dimer No results for input(s): DDIMER in the last 72 hours. Hemoglobin A1C No results for input(s): HGBA1C in the last 72 hours. Fasting Lipid Panel No results for input(s): CHOL,  HDL, LDLCALC, TRIG, CHOLHDL, LDLDIRECT in the last 72 hours. Thyroid Function Tests Recent Labs    09/14/17 0900  TSH 3.162    Other results:   Imaging    No results found.   Medications:     Scheduled Medications: . amiodarone  400 mg Oral BID  . apixaban  5 mg Oral BID  . Chlorhexidine Gluconate Cloth  6 each Topical Daily  . digoxin  0.125 mg Oral Daily  . furosemide  80 mg Intravenous BID  . potassium chloride  40 mEq Oral Daily  . sodium chloride flush  10-40 mL Intracatheter Q12H  . sodium chloride flush  3 mL Intravenous Q12H  . spironolactone  25 mg Oral Daily    Infusions: . sodium chloride    . sodium chloride 10 mL/hr at 09/15/17 1600  . cefTRIAXone (ROCEPHIN)  IV 1 g (09/15/17 1729)    PRN Medications: sodium chloride, acetaminophen, bisacodyl, MUSCLE RUB, ondansetron (ZOFRAN) IV, sodium  chloride, sodium chloride flush, sodium chloride flush, traMADol    Patient Profile   Ms Virrueta is a 77 year old with a history chronic systolic heart failure, anemia, htn, LBBB, renal artery stenosis, and OSA.   Transferred to [facility] on 7/10 with cardiogenic shock, Afib RVR, and volume overload.  Assessment/Plan   1. Acute on chronic systolic CHF: Echo (2/63) with EF 20%. Long-standing cardiomyopathy. Nonischemic based on cath in 2017. She had low output HF with co-ox 41%, CVP 17, AKI on CKD stage 3 ("cold and wet"). Exacerbation triggered by atrial fibrillation and also by being off Lasix for a number of days after hospitalization at [facility] for UTI.  Today, co-ox up to 73% off milrinone and norepinephrine.  CVP 13 today. She is in NSR after DCCV 7/14.  Still requiring supplemental oxygen.  - With downtrending creatinine, will start digoxin 0.125 today.  - Increase Lasix to 80 mg IV bid with CVP still high, add ted hose.   - Increase spironolactone to 25 mg daily.   - Has wide LBBB, ultimately needs CRT.  Will likely need to be done in a month when anticoagulation can be held post-DCCV.  2. AKI on CKD stage 3: Suspect cardiorenal syndrome with low output HF. No NSAID use.  With improved BP and cardiac output, creatinine starting to trend down, 2.12 today.  3. Atrial fibrillation: Admitted with afib/RVR, had been in afib for about 6 wks. TSH normal.  Had been on Eliquis for about 4 weeks without missing any doses. Cardioverted to NSR 7/11 but back in atrial fibrillation with RVR by 7/12. Repeat DCCV to NSR in 7/14 after additional amiodarone loading.  - Transition to amiodarone 400 mg bid today.  - Continue Eliquis.  - Based on CASTLE-HF, would consider afib ablation in the future.   - Replace K.   4. Elevated LFTs: Suspect shock liver with low cardiac output. LFTs trending down.  5. UTI: E coli. Continue ceftriaxone.  6. Deconditioning: PT/OT consults. Out of bed. Will need SNF  at discharge.  She asks about [facility] inpatient rehab, will consult CIR.   [name] 09/16/2017 7:37 AM

## 2017-09-16 NOTE — Progress Notes (Signed)
Occupational Therapy Treatment Patient Details Name: Gina [name] MRN: [id number] DOB: 1940-10-05 Today's Date: 09/16/2017    History of present illness 77 yo transfer from [facility] to [facility] 7/10 with cardiogenic shock, Afib and volume overload s/p DCCV 7/11 with return to Afib post procedur.  Underwent DCCV again 7/14 PMHx: CHF, anemia, HTN, Left BBB, OSA, renal artery stenosis, CKD, CAD   OT comments  Pt progressing towards established OT goals. Pt continues to present with decreased activity tolerance and occupational performance. Pt requiring Total A for LB dressing, Min A +2 for toilet transfer to [facility], and Max A for toilet hygiene. Will continue to follow acutely as admitted, and pt would benefit from UE exercises for strengthening. Continue to recommend dc to post-acute rehab for further OT to facilitate safety and independence with ADLs and functional mobility.    Follow Up Recommendations  SNF    Equipment Recommendations  None recommended by OT    Recommendations for Other Services      Precautions / Restrictions Precautions Precautions: Fall Restrictions Weight Bearing Restrictions: No       Mobility Bed Mobility               General bed mobility comments: In recliner upon arrival  Transfers Overall transfer level: Needs assistance Equipment used: Rolling walker (2 wheeled) Transfers: Sit to/from [facility] Sit to Stand: Min assist;+2 physical assistance;+2 safety/equipment Stand pivot transfers: Min assist;+2 safety/equipment       General transfer comment: Min A +2 to power up into standing.     Balance Overall balance assessment: Needs assistance Sitting-balance support: Feet supported;Bilateral upper extremity supported Sitting balance-Leahy Scale: Poor     Standing balance support: Bilateral upper extremity supported;During functional activity Standing balance-Leahy Scale: Poor Standing balance comment: reliant on UE support                             ADL either performed or assessed with clinical judgement   ADL Overall ADL's : Needs assistance/impaired                     Lower Body Dressing: Total assistance Lower Body Dressing Details (indicate cue type and reason): Pt unable to bend forward towards feet for donning shoes. Requiring total A. Pt fatigues quickly Toilet Transfer: Minimal assistance;+2 for physical assistance;+2 for safety/equipment;Stand-pivot;BSC;RW Toilet Transfer Details (indicate cue type and reason): Min A +2 to power up into standing then transition to [facility].  Toileting- Clothing Manipulation and Hygiene: Sit to/from stand;Maximal assistance;+2 for safety/equipment Toileting - Clothing Manipulation Details (indicate cue type and reason): Pt requiring Max A for toileting due to fatigue.      Functional mobility during ADLs: Minimal assistance;+2 for physical assistance;Rolling walker;Cueing for safety General ADL Comments: Pt requiring increased encougarment for occupational participation and performance. Continues to fatigue quickly and show SOB. Pt VSS throughout with RR in 20s, HR highest at 100, and SpO2 in 90s     Vision       Perception     Praxis      Cognition Arousal/Alertness: Awake/alert Behavior During Therapy: WFL for tasks assessed/performed Overall Cognitive Status: Within Functional Limits for tasks assessed                                          Exercises  Shoulder Instructions       General Comments HR max 100, SpO2 >94% on 4L O2, and RR 20-30s    Pertinent Vitals/ Pain       Pain Assessment: Faces Faces Pain Scale: Hurts even more Pain Location: Lower back Pain Descriptors / Indicators: Aching;Sore Pain Intervention(s): Monitored during session;Limited activity within patient's tolerance;Repositioned  Home Living                                          Prior Functioning/Environment               Frequency  Min 2X/week        Progress Toward Goals  OT Goals(current goals can now be found in the care plan section)  Progress towards OT goals: Progressing toward goals  Acute Rehab OT Goals Patient Stated Goal: To get back to independence and taking care of self  OT Goal Formulation: With patient Time For Goal Achievement: 09/28/17 Potential to Achieve Goals: Good ADL Goals Pt Will Perform Grooming: with min assist;standing Pt Will Perform Upper Body Bathing: with supervision;with set-up;sitting Pt Will Perform Lower Body Bathing: with min assist;sit to/from stand Pt Will Perform Upper Body Dressing: with set-up;with supervision;sitting Pt Will Perform Lower Body Dressing: with min assist;sit to/from stand Pt Will Transfer to Toilet: with min assist;ambulating;regular height toilet;bedside commode;grab bars Pt Will Perform Toileting - Clothing Manipulation and hygiene: with min assist;sit to/from stand Pt/caregiver will Perform Home Exercise Program: Increased strength;Right Upper extremity;Left upper extremity;With theraband;With Supervision;With written HEP provided  Plan Discharge plan remains appropriate    Co-evaluation                 AM-PAC PT "6 Clicks" Daily Activity     Outcome Measure   Help from another person eating meals?: None Help from another person taking care of personal grooming?: A Little Help from another person toileting, which includes using toliet, bedpan, or urinal?: A Lot Help from another person bathing (including washing, rinsing, drying)?: A Lot Help from another person to put on and taking off regular upper body clothing?: A Little Help from another person to put on and taking off regular lower body clothing?: Total 6 Click Score: 15    End of Session Equipment Utilized During Treatment: Gait belt;Rolling walker  OT Visit Diagnosis: Unsteadiness on feet (R26.81)   Activity Tolerance Patient limited by fatigue    Patient Left in chair;with call bell/phone within reach   Nurse Communication Mobility status        Time: 7915-0569 OT Time Calculation (min): 29 min  Charges: OT General Charges $OT Visit: 1 Visit OT Treatments $Self Care/Home Management : 8-22 mins  [place], OTR/L Acute Rehab Pager: [phone] Office: [place] 09/16/2017, 12:37 PM

## 2017-09-16 NOTE — Consult Note (Signed)
PULMONARY / CRITICAL CARE MEDICINE  Name: Gina [name] MRN: [id number] DOB: 1940-11-04    ADMISSION DATE:  09/02/2017 CONSULTATION DATE:  09/16/17  REFERRING MD :  Radford Pax  CHIEF COMPLAINT:  SOB   HISTORY OF PRESENT ILLNESS:  Gina [name] is a 77 y.o. female with a PMH as outlined below including but not limited to sCHF (EF 20 from 09/09/17, followed by Dr. Harl Bowie), A.fib, CAD, HTN.  Admitted to [facility] 09/02/17 with SOB and worsening edema x 2 weeks felt to be due to AF with RVR, chronic sCHF, cardiogenic shock.  She was seen by cardiology and nephrology and was started on aggressive diuresis and dobutamine.   Transferred to [facility] 7/10 for evaluation by advanced heart failure team.  Dobutamine was changed to milrinone and lasix increased to 80mg  BID.  Amio + apixaban continued due to A.fib.  Underwent DCCV 7/11 and 7/14 with conversion to NSR (successful after first attempt 7/11 but then reverted to A.fib so DCCV repeated 7/14).  Cardiology planning for ICD placement in the future.  During her hospitalization, she has been treated with ceftriaxone for E.coli UTI.  Evening of 7/16, she developed dyspnea and increased WOB.  CXR suggested pulmonary edema.  She was started on BiPAP and PCCM was called to see in consultation.     PAST MEDICAL HISTORY :   has a past medical history of Acute renal failure ([place]), Anemia, Aortic insufficiency, Arthritis, Asymmetric septal hypertrophy (HCC), Atrial fibrillation ([place]), Bradycardia, CAD (coronary artery disease), Cardiomyopathy, nonischemic (HCC), Ejection fraction, Groin hematoma, Hypertension, LBBB (left bundle branch block), LVH (left ventricular hypertrophy), Mitral regurgitation, OSA (obstructive sleep apnea), Renal artery stenosis ([place]), and Systolic heart failure.  has a past surgical history that includes Abdominal hysterectomy; Surgical Repair of a Catheteriztion associated femoral artery injury; Cardiac catheterization (N/A, 08/14/2015); Replacement  total knee; Replacement total hip w/  resurfacing implants; Cardioversion (N/A, 09/11/2017); and Cardioversion (N/A, 09/14/2017). Prior to Admission medications   Medication Sig Start Date End Date Taking? Authorizing Provider  apixaban (ELIQUIS) 5 MG TABS tablet Take 1 tablet (5 mg total) by mouth 2 (two) times daily. 08/15/17  Yes BranchAlphonse Guild, MD  aspirin 81 MG tablet Take 81 mg by mouth daily.     Yes [provider]  calcitRIOL (ROCALTROL) 0.25 MCG capsule Take 0.25 mcg by mouth daily.   Yes [provider]  carvedilol (COREG) 12.5 MG tablet Take 1.5 tablets (18.75 mg total) by mouth 2 (two) times daily. 08/15/17 11/13/17 Yes BranchAlphonse Guild, MD  colchicine 0.6 MG tablet Take 0.6 mg by mouth as needed. Take as needed when gout flares up   Yes [provider]  docusate sodium (COLACE) 100 MG capsule Take 100 mg by mouth daily.   Yes [provider]  ENTRESTO 49-51 MG TAKE 1 TABLET BY MOUTH TWICE DAILY 02/24/17  Yes Branch, Alphonse Guild, MD  ferrous sulfate 325 (65 FE) MG tablet Take 325 mg by mouth at bedtime.    Yes [provider]  furosemide (LASIX) 80 MG tablet TAKE 1 TABLET EVERY MORNING Patient taking differently: 80 mg 2 (two) times daily. TAKE 1 TABLET EVERY MORNING 08/15/17  Yes Branch, Alphonse Guild, MD  isosorbide mononitrate (IMDUR) 60 MG 24 hr tablet TAKE 1 TABLET BY MOUTH EVERY DAY 06/25/17  Yes Branch, Alphonse Guild, MD  Multiple Vitamin (MULTIVITAMIN) tablet Take 1 tablet by mouth daily.   Yes [provider]  Multiple Vitamins-Minerals (ICAPS PO) Take 1 capsule by mouth  daily.   Yes [provider]  potassium chloride SA (K-DUR,KLOR-CON) 20 MEQ tablet Take by mouth as needed. Pt states that take as need when blood work is done and how many meq to take    [provider]   Allergies  Allergen Reactions  . Cephalexin     REACTION: Rash to arms/legs    FAMILY HISTORY:  family history includes Alzheimer's  disease in her mother; Bradycardia in her brother; Heart failure in her paternal grandfather and paternal grandmother; Rheumatic fever in her mother; Stomach cancer (age of onset: 67) in her father. SOCIAL HISTORY:  reports that she quit smoking about 31 years ago. Her smoking use included cigarettes. She started smoking about 58 years ago. She has a 24.00 pack-year smoking history. She has never used smokeless tobacco. She reports that she does not drink alcohol.  REVIEW OF SYSTEMS:  All negative; except for those that are bolded, which indicate positives.  Constitutional: weight loss, weight gain, night sweats, fevers, chills, fatigue, weakness.  HEENT: headaches, sore throat, sneezing, nasal congestion, post nasal drip, difficulty swallowing, tooth/dental problems, visual complaints, visual changes, ear aches. Neuro: difficulty with speech, weakness, numbness, ataxia. CV:  chest pain, orthopnea, PND, swelling in lower extremities, dizziness, palpitations, syncope.  Resp: cough, hemoptysis, dyspnea - improved with BiPAP, wheezing. GI: heartburn, indigestion, abdominal pain, nausea, vomiting, diarrhea, constipation, change in bowel habits, loss of appetite, hematemesis, melena, hematochezia.  GU: dysuria, change in color of urine, urgency or frequency, flank pain, hematuria. MSK: joint pain or swelling, decreased range of motion. Psych: change in mood or affect, depression, anxiety, suicidal ideations, homicidal ideations. Skin: rash, itching, bruising.   SUBJECTIVE:  On BiPAP but feels dyspnea has improved since starting.  Currently resting comfortably.  VITAL SIGNS: Temp:  [97.8 F (36.6 C)-98.9 F (37.2 C)] 98.7 F (37.1 C) (07/16 2000) Pulse Rate:  [48-116] 116 (07/16 2100) Resp:  [15-38] 28 (07/16 2100) BP: (92-137)/(48-105) 92/72 (07/16 2100) SpO2:  [92 %-99 %] 99 % (07/16 2100) Weight:  [87.2 kg (192 lb 3.2 oz)] 87.2 kg (192 lb 3.2 oz) (07/16 0600)  PHYSICAL  EXAMINATION: General: Adult female, resting in bed, in NAD. Neuro: Awake, follows commands. HEENT: /AT. Sclerae anicteric, EOMI.  BiPAP in place. Cardiovascular: RRR, no M/R/G.  Lungs: Respirations even and unlabored.  Faint crackles in bases. Abdomen: BS x 4, soft, NT/ND.  Musculoskeletal: No gross deformities, 1+ edema. Skin: Intact, warm, no rashes.   Recent Labs  Lab 09/14/17 0400 09/15/17 0422 09/16/17 0200  NA 139 144 143  K 3.3* 3.3* 3.8  CL 103 105 106  CO2 27 29 26   BUN 86* 75* 67*  CREATININE 2.44* 2.25* 2.12*  GLUCOSE 168* 107* 129*   Recent Labs  Lab 09/13/17 0356 09/14/17 0400 09/15/17 0422  HGB 11.2* 11.0* 11.3*  HCT 37.4 36.4 37.8  WBC 4.3 4.1 4.2  PLT 227 220 225   Dg Chest Port 1 View  Result Date: 09/16/2017 CLINICAL DATA:  Shortness of breath EXAM: PORTABLE CHEST 1 VIEW COMPARISON:  09/09/2017 FINDINGS: Prominent enlargement of the cardiopericardial silhouette with bilateral indistinct interstitial accentuation and bibasilar airspace opacities. The airspace opacities are increased from prior. Atherosclerotic calcification of the aortic arch. Right IJ line tip: SVC. No appreciable pneumothorax. IMPRESSION: 1. Prominent enlargement of the cardiopericardial silhouette with interstitial pulmonary edema. 2. Bibasilar airspace opacities possibly from layering effusions, atelectasis, or bibasilar pneumonia. 3.  Aortic Atherosclerosis (ICD10-I70.0). Electronically Signed   By: Cindra Eves.D.  On: 09/16/2017 19:08    STUDIES:  Echo 7/9 > EF 20%, diffuse hypokinesis. CXR 7/16 > interstitial edema.  SIGNIFICANT EVENTS  7/2 > admit to [facility]. 7/10 > transfer to [facility]. 7/11 > DCCV. 7/14 > 2nd DCCV. 7/16 > PCCM consult.  ASSESSMENT / PLAN:  Acute hypoxic respiratory failure - felt to be due to acute pulmonary edema in setting sCHF.  Pt has been started on BiPAP and subjectively feels better. Possible aspiration? - low likelihood. Plan: Continue BiPAP  for tonight. Continue aggressive diuresis per cards, goal neg balance (currently - 2.2L net). Strict I/O's. Continue empiric zosyn for now, low threshold to d/c / de-escalate. Follow CXR.  AoC sCHF - Echo from July 2019 with EF 20%. Hx A.fib with RVR (s/p DCCV, now in NSR), NICM. Plan: Cards following / managing, continue current plan of care (lasix, spiro, amio, dig, eliquis.  Milrinone stopped 7/15). Possible A.fib ablation in the future. Follow co-ox, CVP.  E.coli UTI - had been on ceftriaxone; however, abx broadened 7/16 for possible aspiration. Plan: Continue empiric zosyn for now, narrow as able.  Generalized deconditioning. Plan: Continue PT / OT efforts.   Rest per primary team.   Montey Hora, [place] [facility] Pager: [phone]  or [phone] 09/16/2017, 9:53 PM

## 2017-09-16 NOTE — Progress Notes (Signed)
   Notified by RN that patient was having increase tachypnea, increased O2 requirement, and labored breathing this evening. Upon initial examination she was noted to have accessory muscle use with bibasilar crackles on exam. CXR obtained revealed possible bibasilar PNA. Co-ox ordered. Later in the evening RN notified that patient was tachycardic and telemetry was concerning for ventricular tachycardia. EKG more consistent with atrial fibrillation with RVR. She was given a an IV amiodarone bolus and continuous infusion started. Discussed with PCCM regarding CXR findings and respiratory decompensation and recommended broadening antibiotics for HCAP and BiPAP. She was started on zosyn. Rate improved with amiodarone. Dr. Radford Pax evaluated patient and will monitor overnight.

## 2017-09-16 NOTE — Progress Notes (Signed)
Inpatient Rehabilitation  Please see consult by Dr. Posey Pronto for full details; patient more appropriate for less intense post acute rehab and prefers to go to a SNF near home in [place].  Discussed with CSW.  Will sign off at this time.    Carmelia Roller., CCC/SLP Admission Coordinator  [facility]  Cell [phone]

## 2017-09-16 NOTE — Consult Note (Signed)
Physical Medicine and Rehabilitation Consult   Reason for Consult: Debility.  Referring Physician: Dr. Aundra Dubin   HPI: Gina [name] is a 77 y.o. female with history of  CAD with ICM- EF 20%, recent onset Afib- on Eliquis, HTN, CKD- baseline SCr- 1.6, RAS who was admitted to [facility] on 09/02/2017 with acute on chronic congestive heart failure, peripheral edema,  worsening of renal status with serum creatinine up to 2.3 from diuretics. History taken from chart review and patient. Patient with 3 recent admission for CHF in the past 6 weeks. She was started on IV diuretic and started on IV amiodarone with plans for DCCV. She continued to have SOB with cardiogenic shock and was  transferred to [facility] for management on 7/10.  Has been followed by heart failure team and underwent DCCV on 07/11 and 7/14 and remains in NSR.  She has been weaned off milrionine and E coli UTI treated with ceftriaxone. Plans for ICD in the future.  Therapy ongoing and patient requesting CIR.   Review of Systems  Constitutional: Negative for chills and fever.  HENT: Negative for hearing loss and tinnitus.   Eyes: Negative for blurred vision and double vision.  Respiratory: Positive for shortness of breath. Negative for cough.   Cardiovascular: Positive for leg swelling. Negative for chest pain and palpitations.  Gastrointestinal: Negative for constipation, heartburn and nausea.  Musculoskeletal: Positive for back pain and myalgias.  Skin: Negative for rash.  Neurological: Negative for dizziness, speech change, focal weakness and headaches.  All other systems reviewed and are negative.     Past Medical History:  Diagnosis Date  . Acute renal failure [facility])     hospitalized... December 08, 2009... improved with hydration in the hospital  . Anemia    further workup needed... October, 2011.  [name] Aortic insufficiency    mild...echo... June 2 010  . Arthritis    Hips/Knees, severe, requiring a walker  . Asymmetric septal  hypertrophy (HCC)    echo....04/2009....patient encouraged the family to be screened elsewhere  . Atrial fibrillation ([place])     ??? atrial fibrillation during hospitalization October, 2011 ???..  . Bradycardia   . CAD (coronary artery disease)    Mild coronary disease, catheterization, 2009  . Cardiomyopathy, nonischemic ([place])    EF improved October, 2011  . Ejection fraction    EF 25%...nondiagnostic MRI December 2009...  /  echo June, 2010  /   echo... December 08, 2009.... ejection fraction improved... EF 60% /  EF 30%... echo.... [place]... December 28, 2009 with CHFPLanned ICD / CRT... patient has seen Dr.Klein..EF improved October, 2011   . Groin hematoma    Right-hematoma/abscess..repaired.. December, 2009  . Hypertension   . LBBB (left bundle branch block)   . LVH (left ventricular hypertrophy)    moderately severe... echo.. June, 2010  /  EF improved October, 2011.... cancel plans for ICD  . Mitral regurgitation    mild/ moderate...echo June, 2010  /   echo.. October, 2011.... mitral regurgitation  improved  . OSA (obstructive sleep apnea)   . Renal artery stenosis (HCC)    80% upper left   . Systolic heart failure    chronic    Past Surgical History:  Procedure Laterality Date  . ABDOMINAL HYSTERECTOMY    . CARDIAC CATHETERIZATION N/A 08/14/2015   Procedure: Right/Left Heart Cath and Coronary Angiography;  Surgeon: Leonie Man, MD;  Location: [place] CV LAB;  Service: Cardiovascular;  Laterality: N/A;  .  CARDIOVERSION N/A 09/11/2017   Procedure: CARDIOVERSION;  Surgeon: Larey Dresser, MD;  Location: [facility] ENDOSCOPY;  Service: Cardiovascular;  Laterality: N/A;  . CARDIOVERSION N/A 09/14/2017   Procedure: CARDIOVERSION;  Surgeon: Larey Dresser, MD;  Location: [facility] OR;  Service: Cardiovascular;  Laterality: N/A;  . REPLACEMENT TOTAL HIP W/  RESURFACING IMPLANTS    . REPLACEMENT TOTAL KNEE    . Surgical Repair of a Catheteriztion associated femoral artery injury       Family History  Problem Relation Age of Onset  . Rheumatic fever Mother        in her early 98's  . Alzheimer's disease Mother   . Stomach cancer Father 72       died 30  . Bradycardia Brother        has pacemaker  . Heart failure Paternal Grandmother   . Heart failure Paternal Grandfather     Social History:  Married. Husband with mild dementia and family helping out. She has hired help for home management.  She reports that she quit smoking about 31 years ago. Her smoking use included cigarettes. She started smoking about 58 years ago. She has a 24.00 pack-year smoking history. She has never used smokeless tobacco. She reports that she does not drink alcohol. Her drug history is not on file.   Allergies  Allergen Reactions  . Cephalexin     REACTION: Rash to arms/legs    Medications Prior to Admission  Medication Sig Dispense Refill  . apixaban (ELIQUIS) 5 MG TABS tablet Take 1 tablet (5 mg total) by mouth 2 (two) times daily. 60 tablet 3  . aspirin 81 MG tablet Take 81 mg by mouth daily.      . calcitRIOL (ROCALTROL) 0.25 MCG capsule Take 0.25 mcg by mouth daily.    . carvedilol (COREG) 12.5 MG tablet Take 1.5 tablets (18.75 mg total) by mouth 2 (two) times daily. 270 tablet 1  . colchicine 0.6 MG tablet Take 0.6 mg by mouth as needed. Take as needed when gout flares up    . docusate sodium (COLACE) 100 MG capsule Take 100 mg by mouth daily.    [name] ENTRESTO 49-51 MG TAKE 1 TABLET BY MOUTH TWICE DAILY 60 tablet 6  . ferrous sulfate 325 (65 FE) MG tablet Take 325 mg by mouth at bedtime.     . furosemide (LASIX) 80 MG tablet TAKE 1 TABLET EVERY MORNING (Patient taking differently: 80 mg 2 (two) times daily. TAKE 1 TABLET EVERY MORNING) 90 tablet 1  . isosorbide mononitrate (IMDUR) 60 MG 24 hr tablet TAKE 1 TABLET BY MOUTH EVERY DAY 90 tablet 0  . Multiple Vitamin (MULTIVITAMIN) tablet Take 1 tablet by mouth daily.    . Multiple Vitamins-Minerals (ICAPS PO) Take 1 capsule by  mouth daily.    . potassium chloride SA (K-DUR,KLOR-CON) 20 MEQ tablet Take by mouth as needed. Pt states that take as need when blood work is done and how many meq to take      Home: [place] expects to be discharged to:: Skilled nursing facility Living Arrangements: Spouse/significant other Available Help at Discharge: Family Type of Home: House Home Access: Stairs to enter Technical brewer of Steps: 1 Home Layout: One level Bathroom Accessibility: Yes Home Equipment: [place] - single point, Environmental consultant - 4 wheels, Bedside commode, Shower seat, Grab bars - tub/shower, Wheelchair - manual Additional Comments: Pt lives with spouse who has dementia   Functional History: Prior Function Level of Independence: Independent  with assistive device(s) Comments: Pt mod I with ADLs.  Has hired cleaning person.  Spouse has dementia so pt is caregiver for him - requires supervision.   Uses Rollator at home  Functional Status:  Mobility: Bed Mobility Overal bed mobility: Needs Assistance Bed Mobility: Supine to Sit Rolling: Min assist Sidelying to sit: Mod assist Supine to sit: Mod assist, HOB elevated Sit to supine: Mod assist General bed mobility comments: assist to move LEs off bed, to lift trunk and to scoot hips forward  Transfers Overall transfer level: Needs assistance Equipment used: Rolling walker (2 wheeled) Transfers: Sit to/from Stand, Stand Pivot Transfers Sit to Stand: Mod assist, +2 safety/equipment Stand pivot transfers: Min assist, +2 safety/equipment General transfer comment: assist to boost into standing.  She was able to ambulate ~31f to chair before fatiguing  Ambulation/Gait Ambulation/Gait assistance: Supervision Gait Distance (Feet): 25 Feet Assistive device: Rolling walker (2 wheeled) Gait Pattern/deviations: Decreased step length - right, Decreased step length - left, Decreased stride length General Gait Details: unable Gait velocity: slow     ADL: ADL Overall ADL's : Needs assistance/impaired Eating/Feeding: Independent Grooming: Wash/dry hands, Wash/dry face, Oral care, Brushing hair, Set up, Sitting Upper Body Bathing: Moderate assistance, Sitting Lower Body Bathing: Maximal assistance, Sit to/from stand Upper Body Dressing : Minimal assistance, Sitting Lower Body Dressing: Total assistance, Sit to/from stand Lower Body Dressing Details (indicate cue type and reason): Pt unable to access feet, and fatigues quickly  Toilet Transfer: Moderate assistance, Minimal assistance, +2 for physical assistance, +2 for safety/equipment, Stand-pivot, BSC, RW Toilet Transfer Details (indicate cue type and reason): assist to boost hips and assist to steady  Toileting- Clothing Manipulation and Hygiene: Total assistance, Sit to/from stand Functional mobility during ADLs: Minimal assistance, Moderate assistance, +2 for physical assistance, +2 for safety/equipment, Rolling walker General ADL Comments: Pt with DOE 3/4- 4/4 with minmal activity.  She is unable to lean forrward to access feet or LB   Cognition: Cognition Overall Cognitive Status: Within Functional Limits for tasks assessed Orientation Level: Oriented X4, Appropriate for developmental age Cognition Arousal/Alertness: Awake/alert Behavior During Therapy: WFL for tasks assessed/performed Overall Cognitive Status: Within Functional Limits for tasks assessed  Blood pressure 122/75, pulse 64, temperature 98.9 F (37.2 C), temperature source Oral, resp. rate (!) 26, height '5\' 4"'$  (1.626 m), weight 87.2 kg (192 lb 3.2 oz), SpO2 96 %. Physical Exam  Nursing note and vitals reviewed. Constitutional: She is oriented to person, place, and time. She appears well-developed. She appears distressed.  Obese female up in a chair--on 4L oxygen per [place]. NAD.   HENT:  Head: Normocephalic and atraumatic.  Eyes: EOM are normal. Right eye exhibits no discharge. Left eye exhibits no discharge.   Neck: Normal range of motion. Neck supple.  Cardiovascular: Normal rate and regular rhythm.  Respiratory: Breath sounds normal.  Increased WOB with tachypnea + Junction  GI: Soft. Bowel sounds are normal.  Musculoskeletal:  2+ edema BLE and LUE.  Bilateral hallux valgus  Neurological: She is alert and oriented to person, place, and time.  Motor: Bilateral upper extremities: 4/5 proximal distal Bilateral lower extremities: Hip flexion 3/5, knee extension 4 -/5, ankle dorsiflexion 4+/5  Skin:  Vascular changes LLE  Psychiatric: She has a normal mood and affect. Her behavior is normal. Thought content normal.    Results for orders placed or performed during the hospital encounter of 09/02/17 (from the past 24 hour(s))  Magnesium     Status: None   Collection Time: 09/16/17  2:00 AM  Result Value Ref Range   Magnesium 2.0 1.7 - 2.4 mg/dL  Comprehensive metabolic panel     Status: Abnormal   Collection Time: 09/16/17  2:00 AM  Result Value Ref Range   Sodium 143 135 - 145 mmol/L   Potassium 3.8 3.5 - 5.1 mmol/L   Chloride 106 98 - 111 mmol/L   CO2 26 22 - 32 mmol/L   Glucose, Bld 129 (H) 70 - 99 mg/dL   BUN 67 (H) 8 - 23 mg/dL   Creatinine, Ser 2.12 (H) 0.44 - 1.00 mg/dL   Calcium 8.1 (L) 8.9 - 10.3 mg/dL   Total Protein 5.0 (L) 6.5 - 8.1 g/dL   Albumin 2.6 (L) 3.5 - 5.0 g/dL   AST 19 15 - 41 U/L   ALT 61 (H) 0 - 44 U/L   Alkaline Phosphatase 52 38 - 126 U/L   Total Bilirubin 0.5 0.3 - 1.2 mg/dL   GFR calc non Af Amer 22 (L) >60 mL/min   GFR calc Af Amer 25 (L) >60 mL/min   Anion gap 11 5 - 15  Cooxemetry Panel (carboxy, met, total hgb, O2 sat)     Status: Abnormal   Collection Time: 09/16/17  6:55 AM  Result Value Ref Range   Total hemoglobin 11.8 (L) 12.0 - 16.0 g/dL   O2 Saturation 73.2 %   Carboxyhemoglobin 2.0 (H) 0.5 - 1.5 %   Methemoglobin 0.9 0.0 - 1.5 %   No results found.  Assessment/Plan: Diagnosis: Debility Labs independently reviewed.  Records reviewed and  summated above.  1. Does the need for close, 24 hr/day medical supervision in concert with the patient's rehab needs make it unreasonable for this patient to be served in a less intensive setting? Yes 2. Co-Morbidities requiring supervision/potential complications: UTI (continue IV ABX), CAD with ICM- EF 20% (Monitor in accordance with increased physical activity and avoid UE resistance excercises), Afib (continued medications, monitor heart rate with increased physical activity), HTN (monitor and provide prns in accordance with increased physical exertion and pain), CKD (avoid nephrotoxic meds), acute on chronic congestive heart failure (see above), ABLA (transfuse if necessary to ensure appropriate perfusion for increased activity tolerance) 3. Due to safety, disease management and patient education, does the patient require 24 hr/day rehab nursing? Yes 4. Does the patient require coordinated care of a physician, rehab nurse, PT (1-2 hrs/day, 5 days/week) and OT (1-2 hrs/day, 5 days/week) to address physical and functional deficits in the context of the above medical diagnosis(es)? Yes Addressing deficits in the following areas: balance, endurance, locomotion, strength, transferring, bathing, dressing, toileting and psychosocial support 5. Can the patient actively participate in an intensive therapy program of at least 3 hrs of therapy per day at least 5 days per week? No 6. The potential for patient to make measurable gains while on inpatient rehab is NA 7. Anticipated functional outcomes upon discharge from inpatient rehab are n/a  with PT, n/a with OT, n/a with SLP. 8. Estimated rehab length of stay to reach the above functional goals is: NA 9. Anticipated D/C setting: SNF 10. Anticipated post D/C treatments: SNF 11. Overall Rehab/Functional Prognosis: good  RECOMMENDATIONS: This patient's condition is appropriate for continued rehabilitative care in the following setting: Patient with severely  limited activity tolerance with inability to converse without SOB. Patient prefers to go to SNF near home in [place] and believe this is most appropriate discharge location. Patient has agreed to participate in recommended program. Yes Note that insurance  prior authorization may be required for reimbursement for recommended care.  Comment: Rehab Admissions Coordinator to follow up.   I have personally performed a face to face diagnostic evaluation, including, but not limited to relevant history and physical exam findings, of this patient and developed relevant assessment and plan.  Additionally, I have reviewed and concur with the physician assistant's documentation above.   Delice Lesch, MD, ABPMR Bary Leriche, PA-C 09/16/2017

## 2017-09-16 NOTE — Progress Notes (Addendum)
Pt having increased SOB, shallow, accessory muscle use, RR- 35-40. Pt currently on 5-6L nasal canula. RN notified on call PA, STAT CXR ordered. will continue to monitor

## 2017-09-16 NOTE — Progress Notes (Signed)
eLink Physician-Brief Progress Note Patient Name: Gina [name] DOB: September 23, 1940 MRN: [id number]   Date of Service  09/16/2017  HPI/Events of Note  CHF with EF 20%, treated with milrinone, now complicated with Afib.  CXR looks c/w pulm edema with cardiomegaly.   eICU Interventions  D/w cardiology, recommended -Bipap -diuresis -Broaden abx coverage (though doubt pneumonia)        Laverle Hobby 09/16/2017, 8:29 PM

## 2017-09-16 NOTE — Progress Notes (Signed)
Pt Had spontaneous 20 beat run of V.Tach & self converted back to sinus w/ BBB. No hypotension/instability. Notified overnight heart failure fellow & will check labs early.

## 2017-09-16 NOTE — Progress Notes (Addendum)
Called to see patient with Cherlynn Polo, PA due to worsening respiratory distress and wide complex tachycardia.  In review of tele patient was in NSR earlier today and went into atrial fibrillation with RVR.  She has a very wide LBBB at baseline and now in irregular rhythm with same morphology QRS as baseline c/w afib with RVR.    Patient appears SOB at rest using accessory muscle to breath.  O2 sats 95% on 5L.  Cxray read out as bibasilar infiltrates c/w PNA with bilateral effusions but by my review appears more like CHF.  She has been incontinent earlier in the day and was switched to Lasix 80mg  BID. She has only put out 370cc on day shift but has been incontinent and has soaked the bed several times.  She had been on IV milrinone until yesterday am which it was stopped as Coox was 69.7 and this am was 73.2.  Creatinine was also improved this am at 2.12.    Will given an additional dose of lasix 80mg  IV now as Cxray looks like pulmonary edema.  Will place on BiPAP at 12/8cm H2O and titrate has needed by respiratory.  Co ox has been ordered and is pending.  CVP still running around 13 as it was earlier in the day.  I think PNA is less likely as she is afebrile and WBC is normal but discussed with Pulmonary and will broaden her antbx coverage to Zosyn with dosing per pharmacy.  Will give her a dose of Amio 150mg  IV now and restart IV Amio gtt (this was stopped earlier due to bradycardia in setting of NSR).  Check stat BMET/Mag.    Addendum:  Co-ox back at 71 so will not restart Milrinone.  Acute decompensation likely related to reversion back to afib with RVR. Continue IV Amio.  The patient is critically ill with multiple organ systems failure and requires high complexity decision making for assessment and support, frequent evaluation and titration of therapies, application of advanced monitoring technologies and extensive interpretation of multiple databases. Critical Care Time devoted to patient care  services described in this note independent of APP time is 30 minutes with >50% of time spent in direct patient care.

## 2017-09-17 ENCOUNTER — Inpatient Hospital Stay (HOSPITAL_COMMUNITY): Payer: Medicare Other

## 2017-09-17 LAB — COMPREHENSIVE METABOLIC PANEL
ALT: 49 U/L — ABNORMAL HIGH (ref 0–44)
AST: 16 U/L (ref 15–41)
Albumin: 2.6 g/dL — ABNORMAL LOW (ref 3.5–5.0)
Alkaline Phosphatase: 57 U/L (ref 38–126)
Anion gap: 12 (ref 5–15)
BUN: 65 mg/dL — ABNORMAL HIGH (ref 8–23)
CO2: 24 mmol/L (ref 22–32)
Calcium: 8.1 mg/dL — ABNORMAL LOW (ref 8.9–10.3)
Chloride: 106 mmol/L (ref 98–111)
Creatinine, Ser: 2.1 mg/dL — ABNORMAL HIGH (ref 0.44–1.00)
GFR calc Af Amer: 25 mL/min — ABNORMAL LOW (ref >60)
GFR calc non Af Amer: 22 mL/min — ABNORMAL LOW (ref >60)
Glucose, Bld: 169 mg/dL — ABNORMAL HIGH (ref 70–99)
Potassium: 4.2 mmol/L (ref 3.5–5.1)
Sodium: 142 mmol/L (ref 135–145)
Total Bilirubin: 0.8 mg/dL (ref 0.3–1.2)
Total Protein: 5 g/dL — ABNORMAL LOW (ref 6.5–8.1)

## 2017-09-17 LAB — EXPECTORATED SPUTUM ASSESSMENT W GRAM STAIN, RFLX TO RESP C

## 2017-09-17 LAB — CBC
HCT: 36.5 % (ref 36.0–46.0)
Hemoglobin: 11 g/dL — ABNORMAL LOW (ref 12.0–15.0)
MCH: 30.4 pg (ref 26.0–34.0)
MCHC: 30.1 g/dL (ref 30.0–36.0)
MCV: 100.8 fL — ABNORMAL HIGH (ref 78.0–100.0)
Platelets: 216 10*3/uL (ref 150–400)
RBC: 3.62 MIL/uL — ABNORMAL LOW (ref 3.87–5.11)
RDW: 18.6 % — ABNORMAL HIGH (ref 11.5–15.5)
WBC: 4.2 10*3/uL (ref 4.0–10.5)

## 2017-09-17 LAB — COOXEMETRY PANEL
Carboxyhemoglobin: 1.3 % (ref 0.5–1.5)
Carboxyhemoglobin: 1.6 % — ABNORMAL HIGH (ref 0.5–1.5)
Methemoglobin: 1.3 % (ref 0.0–1.5)
Methemoglobin: 0.8 % (ref 0.0–1.5)
O2 Saturation: 66.5 %
O2 Saturation: 51.1 %
Total hemoglobin: 11.3 g/dL — ABNORMAL LOW (ref 12.0–16.0)
Total hemoglobin: 11.9 g/dL — ABNORMAL LOW (ref 12.0–16.0)

## 2017-09-17 LAB — GLUCOSE, CAPILLARY: Glucose-Capillary: 121 mg/dL — ABNORMAL HIGH (ref 70–99)

## 2017-09-17 LAB — MAGNESIUM: Magnesium: 1.9 mg/dL (ref 1.7–2.4)

## 2017-09-17 MED ORDER — MILRINONE LACTATE IN DEXTROSE 20-5 MG/100ML-% IV SOLN
0.1250 ug/kg/min | INTRAVENOUS | Status: DC
  Administered 2017-09-17 – 2017-09-20 (×6): 0.25 ug/kg/min via INTRAVENOUS
  Administered 2017-09-21: 0.125 ug/kg/min via INTRAVENOUS
  Filled 2017-09-17 (×7): qty 100

## 2017-09-17 MED ORDER — METOLAZONE 5 MG PO TABS
5.0000 mg | ORAL_TABLET | Freq: Once | ORAL | Status: AC
  Administered 2017-09-17: 5 mg via ORAL
  Filled 2017-09-17: qty 1

## 2017-09-17 MED ORDER — MAGNESIUM SULFATE 50 % IJ SOLN: 1.0000 g | Freq: Once | INTRAMUSCULAR | Status: DC

## 2017-09-17 MED ORDER — FUROSEMIDE 10 MG/ML IJ SOLN
15.0000 mg/h | INTRAVENOUS | Status: DC
  Administered 2017-09-17 – 2017-09-19 (×5): 15 mg/h via INTRAVENOUS
  Filled 2017-09-17 (×7): qty 25

## 2017-09-17 MED ORDER — MAGNESIUM SULFATE IN D5W 1-5 GM/100ML-% IV SOLN
1.0000 g | Freq: Once | INTRAVENOUS | Status: AC
  Administered 2017-09-17: 1 g via INTRAVENOUS
  Filled 2017-09-17: qty 100

## 2017-09-17 NOTE — Progress Notes (Addendum)
Patient ID: Gina [name], female   DOB: [birth year], 77 y.o.   MRN: [id number]     Advanced Heart Failure Rounding Note  PCP-Cardiologist: Gina Dolly, MD   Subjective:    Transferred to [facility] 7/10.   S/P DC-CV with 7/11 conversion to NSR. Developed bradycardia so IV amio stopped and po amio started. Around 2200 on 7/11 she went back into A fib RVR.   Repeat DCCV to NSR 7/14.   She went back into atrial fibrillation with RVR yesterday evening.  Currently on IV amiodarone gtt with HR in 110s.    She was on milrinone/norepinephrine initially for low output HF but now off with stable co-ox.   Increased dyspnea last night and put on CPAP early.  CXR with pulmonary edema and probable pleural effusions.  This morning, she is breathing better.  Co-ox 66%, CVP 13.  She did not diurese well yesterday.    BUN/creatinine still slowly trending down.   Urine CX with E coli, now on ceftriaxone.    Objective:   Weight Range: 208 lb 8.9 oz (94.6 kg) Body mass index is 35.8 kg/m.   Vital Signs:   Temp:  [98 F (36.7 C)-98.8 F (37.1 C)] 98 F (36.7 C) (07/17 0400) Pulse Rate:  [48-116] 114 (07/17 0700) Resp:  [12-38] 20 (07/17 0700) BP: (80-137)/(48-105) 104/84 (07/17 0700) SpO2:  [93 %-99 %] 98 % (07/17 0700) Weight:  [208 lb 8.9 oz (94.6 kg)] 208 lb 8.9 oz (94.6 kg) (07/17 0500) Last BM Date: 09/15/17  Weight change: Filed Weights   09/15/17 0500 09/16/17 0600 09/17/17 0500  Weight: 253 lb 8.5 oz (115 kg) 192 lb 3.2 oz (87.2 kg) 208 lb 8.9 oz (94.6 kg)    Intake/Output:   Intake/Output Summary (Last 24 hours) at 09/17/2017 0737 Last data filed at 09/17/2017 0200 Gross per 24 hour  Intake 628 ml  Output 870 ml  Net -242 ml      Physical Exam   CVP 13 personally checked.  General: NAD Neck: JVP 12-13 cm, no thyromegaly or thyroid nodule.  Lungs: Clear to auscultation bilaterally with normal respiratory effort. CV: Nondisplaced PMI.  Heart tachy, irregular S1/S2, no  S3/S4, 1/6 HSM LLSB.  1+ edema to knees.  Abdomen: Soft, nontender, no hepatosplenomegaly, no distention.  Skin: Intact without lesions or rashes.  Neurologic: Alert and oriented x 3.  Psych: Normal affect. Extremities: No clubbing or cyanosis.  HEENT: Normal.      Telemetry   Atrial fibrillation 110s, personally reviewed.   EKG    n/a  Labs    CBC Recent Labs    09/15/17 0422  WBC 4.2  HGB 11.3*  HCT 37.8  MCV 100.0  PLT 366   Basic Metabolic Panel Recent Labs    09/16/17 2104 09/17/17 0500  NA 141 142  K 4.3 4.2  CL 102 106  CO2 25 24  GLUCOSE 198* 169*  BUN 68* 65*  CREATININE 2.23* 2.10*  CALCIUM 8.4* 8.1*  MG 2.0 1.9   Liver Function Tests Recent Labs    09/16/17 0200 09/17/17 0500  AST 19 16  ALT 61* 49*  ALKPHOS 52 57  BILITOT 0.5 0.8  PROT 5.0* 5.0*  ALBUMIN 2.6* 2.6*   No results for input(s): LIPASE, AMYLASE in the last 72 hours. Cardiac Enzymes No results for input(s): CKTOTAL, CKMB, CKMBINDEX, TROPONINI in the last 72 hours.  BNP: BNP (last 3 results) Recent Labs    09/02/17 1512  BNP >4,500.0*  ProBNP (last 3 results) No results for input(s): PROBNP in the last 8760 hours.   D-Dimer No results for input(s): DDIMER in the last 72 hours. Hemoglobin A1C No results for input(s): HGBA1C in the last 72 hours. Fasting Lipid Panel No results for input(s): CHOL, HDL, LDLCALC, TRIG, CHOLHDL, LDLDIRECT in the last 72 hours. Thyroid Function Tests Recent Labs    09/14/17 0900  TSH 3.162    Other results:   Imaging    Dg Chest Port 1 View  Result Date: 09/16/2017 CLINICAL DATA:  Shortness of breath EXAM: PORTABLE CHEST 1 VIEW COMPARISON:  09/09/2017 FINDINGS: Prominent enlargement of the cardiopericardial silhouette with bilateral indistinct interstitial accentuation and bibasilar airspace opacities. The airspace opacities are increased from prior. Atherosclerotic calcification of the aortic arch. Right IJ line tip: SVC.  No appreciable pneumothorax. IMPRESSION: 1. Prominent enlargement of the cardiopericardial silhouette with interstitial pulmonary edema. 2. Bibasilar airspace opacities possibly from layering effusions, atelectasis, or bibasilar pneumonia. 3.  Aortic Atherosclerosis (ICD10-I70.0). Electronically Signed   By: Van Clines M.D.   On: 09/16/2017 19:08     Medications:     Scheduled Medications: . apixaban  5 mg Oral BID  . Chlorhexidine Gluconate Cloth  6 each Topical Daily  . digoxin  0.125 mg Oral Daily  . furosemide  80 mg Intravenous BID  . metolazone  5 mg Oral Once  . potassium chloride  40 mEq Oral Daily  . sodium chloride flush  10-40 mL Intracatheter Q12H  . sodium chloride flush  3 mL Intravenous Q12H  . spironolactone  25 mg Oral Daily    Infusions: . sodium chloride    . sodium chloride Stopped (09/15/17 1729)  . amiodarone 30 mg/hr (09/17/17 0335)  . magnesium sulfate 1 - 4 g bolus IVPB    . piperacillin-tazobactam (ZOSYN)  IV 3.375 g (09/17/17 0605)    PRN Medications: sodium chloride, acetaminophen, bisacodyl, ipratropium, MUSCLE RUB, ondansetron (ZOFRAN) IV, sodium chloride, sodium chloride flush, sodium chloride flush, traMADol    Patient Profile   Gina [name] is a 77 year old with a history chronic systolic heart failure, anemia, htn, LBBB, renal artery stenosis, and OSA.   Transferred to [facility] on 7/10 with cardiogenic shock, Afib RVR, and volume overload.  Assessment/Plan   1. Acute on chronic systolic CHF: Echo (6/29) with EF 20%. Long-standing cardiomyopathy. Nonischemic based on cath in 2017. She had low output HF with co-ox 41%, CVP 17, AKI on CKD stage 3 ("cold and wet"). Exacerbation triggered by atrial fibrillation and also by being off Lasix for a number of days after hospitalization at [facility] for UTI.  Today, co-ox up 66% off milrinone and norepinephrine.  CVP 13 today, weights not accurate. She went back into atrial fibrillation with RVR  yesterday evening with worsening of CHF, CXR with pulmonary edema.  Think worsening triggered by going back into AF.  - Continue Lasix 80 mg IV bid and will give a dose of metolazone 5 mg po x 1 this morning.  - With downtrending creatinine, have started digoxin 0.125.  - Continue spironolactone to 25 mg daily.   - We have had a hard time keeping her in NSR, she clearly decompensates in afib with RVR.  Rate difficult to control in afib.  Has wide LBBB, ultimately needs CRT.  Will ask EP to see her today, would AV nodal ablation + CRT be the best option here?  2. AKI on CKD stage 3: Suspect cardiorenal syndrome with low output HF.  No NSAID use.  With improved BP and cardiac output, creatinine has been trending down => 2.1 today.  3. Atrial fibrillation: Admitted with afib/RVR, had been in afib for about 6 wks. TSH normal.  Had been on Eliquis for about 4 weeks without missing any doses. Cardioverted to NSR 7/11 but back in atrial fibrillation with RVR by 7/12. Repeat DCCV to NSR in 7/14 after additional amiodarone loading but back in atrial fibrillation with RVR evening of 7/16 and in afib/RVR today.  She clearly worsens when she goes back into afib with RVR.  - She is back on IV amiodarone and continues Eliquis.  - Prior to re-attempting DCCV, will ask EP to see her.  Answer may end up being AV nodal ablation with CRT.  4. Elevated LFTs: Suspect shock liver with low cardiac output. LFTs trending down.  5. ID: E coli UTI. She completed ceftriaxone. She was started on Zosyn with decompensation on 7/16 pm but suspect this was due to CHF/pulmonary edema rather than PNA, so will stop abx.  6. Deconditioning: PT/OT consults. Out of bed. Will need SNF at discharge.    CRITICAL CARE Performed by: [name]  Total critical care time: 35 minutes  Critical care time was exclusive of separately billable procedures and treating other patients.  Critical care was necessary to treat or prevent imminent  or life-threatening deterioration.  Critical care was time spent personally by me on the following activities: development of treatment plan with patient and/or surrogate as well as nursing, discussions with consultants, evaluation of patient's response to treatment, examination of patient, obtaining history from patient or surrogate, ordering and performing treatments and interventions, ordering and review of laboratory studies, ordering and review of radiographic studies, pulse oximetry and re-evaluation of patient's condition.  [name] 09/17/2017 7:37 AM

## 2017-09-17 NOTE — Progress Notes (Signed)
PULMONARY / CRITICAL CARE MEDICINE  Name: Gina [name] MRN: [id number] DOB: April 21, 1940    ADMISSION DATE:  09/02/2017 CONSULTATION DATE:  09/16/17  REFERRING MD :  Radford Pax  CHIEF COMPLAINT:  SOB   HISTORY OF PRESENT ILLNESS:  Gina [name] is a 77 y.o. female with a PMH including but not limited to sCHF (EF 20 from 09/09/17, followed by Dr. Harl Bowie), A.fib, CAD, HTN.  Admitted to [facility] 09/02/17 with SOB and worsening edema x 2 weeks felt to be due to AF with RVR, chronic sCHF, cardiogenic shock.  She was seen by cardiology and nephrology and was started on aggressive diuresis and dobutamine.   Transferred to [facility] 7/10 for evaluation by advanced heart failure team.  Dobutamine was changed to milrinone and lasix increased to 80mg  BID.  Amio + apixaban continued due to A.fib.  Underwent DCCV 7/11 and 7/14 with conversion to NSR (successful after first attempt 7/11 but then reverted to A.fib so DCCV repeated 7/14).  Cardiology planning for ICD placement in the future.  During her hospitalization, she has been treated with ceftriaxone for E.coli UTI.  Evening of 7/16, she developed dyspnea and increased WOB.  CXR suggested pulmonary edema.  She was started on BiPAP and PCCM was called to see in consultation.   SUBJECTIVE:   Tmax 98.9, WBC 4.2.  HR 110's.  I/O - net neg 2.4L since admit, 1L in last 24 hours.  SvO2 66%.   VITAL SIGNS: Temp:  [97.9 F (36.6 C)-98.8 F (37.1 C)] 97.9 F (36.6 C) (07/17 0700) Pulse Rate:  [48-118] 118 (07/17 0828) Resp:  [12-42] 42 (07/17 0828) BP: (80-137)/(48-105) 112/82 (07/17 0800) SpO2:  [93 %-99 %] 97 % (07/17 0828) Weight:  [208 lb 8.9 oz (94.6 kg)] 208 lb 8.9 oz (94.6 kg) (07/17 0500)  PHYSICAL EXAMINATION: General:  Elderly female sitting up in bed.  NAD.  HEENT: MM pink/dry Neuro: AAOx4, speech clear, MAE CV: s1s2 distant tones  PULM: even/non-labored, lungs bilaterally diminished  CZ:YSAY, non-tender, bsx4 active  Extremities: warm/dry, 1-2+ BLE  pitting edema, LE changes consistent with venous stasis Skin: no rashes or lesions  Recent Labs  Lab 09/16/17 0200 09/16/17 2104 09/17/17 0500  NA 143 141 142  K 3.8 4.3 4.2  CL 106 102 106  CO2 26 25 24   BUN 67* 68* 65*  CREATININE 2.12* 2.23* 2.10*  GLUCOSE 129* 198* 169*   Recent Labs  Lab 09/13/17 0356 09/14/17 0400 09/15/17 0422  HGB 11.2* 11.0* 11.3*  HCT 37.4 36.4 37.8  WBC 4.3 4.1 4.2  PLT 227 220 225   Dg Chest Port 1 View  Result Date: 09/16/2017 CLINICAL DATA:  Shortness of breath EXAM: PORTABLE CHEST 1 VIEW COMPARISON:  09/09/2017 FINDINGS: Prominent enlargement of the cardiopericardial silhouette with bilateral indistinct interstitial accentuation and bibasilar airspace opacities. The airspace opacities are increased from prior. Atherosclerotic calcification of the aortic arch. Right IJ line tip: SVC. No appreciable pneumothorax. IMPRESSION: 1. Prominent enlargement of the cardiopericardial silhouette with interstitial pulmonary edema. 2. Bibasilar airspace opacities possibly from layering effusions, atelectasis, or bibasilar pneumonia. 3.  Aortic Atherosclerosis (ICD10-I70.0). Electronically Signed   By: Van Clines M.D.   On: 09/16/2017 19:08    STUDIES:  Echo 7/9 >> EF 20%, diffuse hypokinesis. CXR 7/16 >> interstitial edema.  SIGNIFICANT EVENTS  7/02 Admit to [facility]. 7[facility]10 [facility]. 7/11 DCCV. 7/14 2nd DCCV. 7/16 PCCM consult.  CULTURES UC 7/13 >> E-Coli > S- ceftriaxone, zosyn  Proteus mirabilis > S- ceftriaxone, zosyn Sputum 7/17 >>   ASSESSMENT / PLAN:  Acute hypoxic respiratory failure - felt to be due to acute pulmonary edema in setting sCHF.  Pt has been started on BiPAP and subjectively feels better. Possible aspiration? - low likelihood. Former Tobacco Abuse - quit 1986, 25pk year hx P: BiPAP QHS & PRN daytime fatigue  Diuresis / volume status per Cardiology Monitor I/O's  Follow intermittent CXR  Wean O2 for sats >  90%, add humidity to O2 Empiric zosyn for possible aspiration, UTI > consider narrowing 7/18 if cultures remain negative  Obtain sputum culture if able  AoC sCHF - Echo from July 2019 with EF 20%. Hx A.fib with RVR (s/p DCCV, now in NSR), NICM. P: Per Cardiology  Tele monitoring  Possible AF ablation in the future  Follow co-ox   E.coli UTI - had been on ceftriaxone; however, abx broadened 7/16 for possible aspiration. P: Zosyn for now, consider narrowing back to rocephin 7/18.  Generalized deconditioning. P: PT/OT efforts   Rest per primary team.   Noe Gens, NP-C [facility] Pgr: [phone] or if no answer [phone] 09/17/2017, 8:41 AM

## 2017-09-17 NOTE — Progress Notes (Signed)
Physical Therapy Treatment Patient Details Name: Gina [name] MRN: [id number] DOB: 22-Feb-1941 Today's Date: 09/17/2017    History of Present Illness 77 yo transfer from [facility] 7/10 with cardiogenic shock, Afib and volume overload s/p DCCV 7/11 with return to Afib post procedur.  Underwent DCCV again 7/14 PMHx: CHF, anemia, HTN, Left BBB, OSA, renal artery stenosis, CKD, CAD    PT Comments    Patient progressing slowly with therapy today, unable to ambulate out of room. Pt states she feels worse than yesterday. Resting HR 108, max 125 this visit.  Still min A x2 for line mgt and guarding. Stand pivot onto scale and onto chair. Will cont to follow and progress as tolerated.   BP 110/83 after transfer  SpO2 >94% on 2L HR 105-182max    Follow Up Recommendations  SNF;Supervision/Assistance - 24 hour     Equipment Recommendations  None recommended by PT    Recommendations for Other Services       Precautions / Restrictions Precautions Precautions: Fall Precaution Comments: watch HR, O2 Restrictions Weight Bearing Restrictions: No    Mobility  Bed Mobility Overal bed mobility: Needs Assistance Bed Mobility: Supine to Sit Rolling: Min assist         General bed mobility comments: min A to scoot hips to EOB, pt can power trunk and support herself with supervision   Transfers Overall transfer level: Needs assistance Equipment used: Rolling walker (2 wheeled) Transfers: Sit to/from [facility] Sit to Stand: Min assist;+2 physical assistance;+2 safety/equipment Stand pivot transfers: Min assist;+2 safety/equipment       General transfer comment: Min A x2 to power up into standing, able to pviot x2 onto scale and into chair, reports feeling weak and becoming somehwat anxious with OOB mobility today. HR max 125, did not progress ambulation due to Vtach/fib alerts on Tele, RN notified. pt denies chest pain, SpO2 94% on 2L.  Ambulation/Gait                  Stairs             Wheelchair Mobility    Modified Rankin (Stroke Patients Only)       Balance Overall balance assessment: Needs assistance   Sitting balance-Leahy Scale: Poor       Standing balance-Leahy Scale: Poor                              Cognition Arousal/Alertness: Awake/alert Behavior During Therapy: WFL for tasks assessed/performed Overall Cognitive Status: Within Functional Limits for tasks assessed                                        Exercises      General Comments        Pertinent Vitals/Pain Pain Assessment: Faces Faces Pain Scale: Hurts little more Pain Location: Lower back Pain Descriptors / Indicators: Aching;Sore Pain Intervention(s): Limited activity within patient's tolerance;Monitored during session    Home Living                      Prior Function            PT Goals (current goals can now be found in the care plan section) Acute Rehab PT Goals Patient Stated Goal: To get back to independence and taking care of self  PT Goal Formulation: With patient Time For Goal Achievement: 09/23/17 Potential to Achieve Goals: Fair Progress towards PT goals: Progressing toward goals    Frequency    Min 3X/week      PT Plan Current plan remains appropriate    Co-evaluation              AM-PAC PT "6 Clicks" Daily Activity  Outcome Measure  Difficulty turning over in bed (including adjusting bedclothes, sheets and blankets)?: Unable Difficulty moving from lying on back to sitting on the side of the bed? : Unable Difficulty sitting down on and standing up from a chair with arms (e.g., wheelchair, bedside commode, etc,.)?: Unable Help needed moving to and from a bed to chair (including a wheelchair)?: A Lot Help needed walking in hospital room?: A Lot Help needed climbing 3-5 steps with a railing? : Total 6 Click Score: 8    End of Session Equipment Utilized  During Treatment: Gait belt;Oxygen(2L) Activity Tolerance: Patient tolerated treatment well Patient left: in chair;with call bell/phone within reach;with chair alarm set;with nursing/sitter in room Nurse Communication: Mobility status;Precautions PT Visit Diagnosis: Unsteadiness on feet (R26.81);Other abnormalities of gait and mobility (R26.89);Muscle weakness (generalized) (M62.81)     Time: 0354-6568 PT Time Calculation (min) (ACUTE ONLY): 30 min  Charges:  $Therapeutic Activity: 8-22 mins                    G Codes:       Reinaldo Berber, PT, DPT Acute Rehab Services Pager: [phone]     Reinaldo Berber 09/17/2017, 10:19 AM

## 2017-09-17 NOTE — Consult Note (Addendum)
Cardiology Consultation:   Patient ID: Gina [name]; [id number]; [birth year]   Admit date: 09/02/2017 Date of Consult: 09/17/2017  Primary Care Provider: Glenda Chroman, MD Primary Cardiologist: Gina Dolly, MD  Primary Electrophysiologist:  new   Patient Profile:   Gina [name] is a 77 y.o. female with a hx of NICM, HTN, HLD, OSA, chronic CHF (systolic), fairly new PAFib, CKD, LBBB, PVD w/ RASwho is being seen today for the evaluation of AFib rate/rhythm strategies at the request of Dr. Aundra [name].  History of Present Illness:   Gina [name] was admitted with recurrent acute/chronic CHF, (with recent hospitalizations for the same in the last couple months).  She was initially evaluated in [facility], transferred for AHF management.  She was in cardiogenic shock, AKI on CKD, placed on amio gtt for her AF rate control as well as dobutamine and milrinone for low out put, her BB stopped.  She has been weaned off milrinone and nor-epinephrine, successfully cardioverted 7/11 again on 7/14, had some bradycardia her amio gtt transitioned to PO, though had ERAF last night with again worsening HF, requiring BIPAP last night very intolerant of AF, placed back on amio gtt rates 110's and EP is asked to aid in rhythm management.   LABS K+ 4.2 BUN/Creat 65/2.10 (peak Creat 2.9) AST 16 ALT 49 WBC 4.2 H/H 11/36 Plts 216  TSH 3.162  [place]ox 66.5 this AM  Being treated as well for Ecoli UTI Elevated LFTs on admission, 2/2 shock, trending down   The patient reports some hx of recent palpitations, with her admission in June [facility]) for UTI, she wondered about her heart because she had been having some palpitations and her pulse ox noted higher then usual HRs.  She has not been having any CP, no near syncope or syncope.   Past Medical History:  Diagnosis Date  . Acute renal failure [facility])     hospitalized... December 08, 2009... improved with hydration in the hospital  . Anemia    further workup  needed... October, 2011.  [name] Aortic insufficiency    mild...echo... June 2 010  . Arthritis    Hips/Knees, severe, requiring a walker  . Asymmetric septal hypertrophy (HCC)    echo....04/2009....patient encouraged the family to be screened elsewhere  . Atrial fibrillation ([place])     ??? atrial fibrillation during hospitalization October, 2011 ???..  . Bradycardia   . CAD (coronary artery disease)    Mild coronary disease, catheterization, 2009  . Cardiomyopathy, nonischemic ([place])    EF improved October, 2011  . Ejection fraction    EF 25%...nondiagnostic MRI December 2009...  /  echo June, 2010  /   echo... December 08, 2009.... ejection fraction improved... EF 60% /  EF 30%... echo.... [place]... December 28, 2009 with CHFPLanned ICD / CRT... patient has seen Dr.Ennifer Harston..EF improved October, 2011   . Groin hematoma    Right-hematoma/abscess..repaired.. December, 2009  . Hypertension   . LBBB (left bundle branch block)   . LVH (left ventricular hypertrophy)    moderately severe... echo.. June, 2010  /  EF improved October, 2011.... cancel plans for ICD  . Mitral regurgitation    mild/ moderate...echo June, 2010  /   echo.. October, 2011.... mitral regurgitation  improved  . OSA (obstructive sleep apnea)   . Renal artery stenosis (HCC)    80% upper left   . Systolic heart failure    chronic    Past Surgical History:  Procedure Laterality Date  . ABDOMINAL HYSTERECTOMY    .  CARDIAC CATHETERIZATION N/A 08/14/2015   Procedure: Right/Left Heart Cath and Coronary Angiography;  Surgeon: Leonie Man, MD;  Location: [facility] LAB;  Service: Cardiovascular;  Laterality: N/A;  . CARDIOVERSION N/A 09/11/2017   Procedure: CARDIOVERSION;  Surgeon: Larey Dresser, MD;  Location: [facility] ENDOSCOPY;  Service: Cardiovascular;  Laterality: N/A;  . CARDIOVERSION N/A 09/14/2017   Procedure: CARDIOVERSION;  Surgeon: Larey Dresser, MD;  Location: [facility] OR;  Service: Cardiovascular;  Laterality: N/A;    . REPLACEMENT TOTAL HIP W/  RESURFACING IMPLANTS    . REPLACEMENT TOTAL KNEE    . Surgical Repair of a Catheteriztion associated femoral artery injury         Inpatient Medications: Scheduled Meds: . apixaban  5 mg Oral BID  . Chlorhexidine Gluconate Cloth  6 each Topical Daily  . digoxin  0.125 mg Oral Daily  . furosemide  80 mg Intravenous BID  . potassium chloride  40 mEq Oral Daily  . sodium chloride flush  10-40 mL Intracatheter Q12H  . spironolactone  25 mg Oral Daily   Continuous Infusions: . amiodarone 30 mg/hr (09/17/17 0900)   PRN Meds: acetaminophen, bisacodyl, ipratropium, MUSCLE RUB, ondansetron (ZOFRAN) IV, sodium chloride, sodium chloride flush, traMADol  Allergies:    Allergies  Allergen Reactions  . Cephalexin     REACTION: Rash to arms/legs    Social History:   Social History   Socioeconomic History  . Marital status: Married    Spouse name: Not on file  . Number of children: 0  . Years of education: Not on file  . Highest education level: Not on file  Occupational History  . Not on file  Social Needs  . Financial resource strain: Not on file  . Food insecurity:    Worry: Not on file    Inability: Not on file  . Transportation needs:    Medical: Not on file    Non-medical: Not on file  Tobacco Use  . Smoking status: Former Smoker    Packs/day: 1.00    Years: 24.00    Pack years: 24.00    Types: Cigarettes    Start date: 10/29/1958    Last attempt to quit: 12/02/1985    Years since quitting: 31.8  . Smokeless tobacco: Never Used  Substance and Sexual Activity  . Alcohol use: No    Alcohol/week: 0.0 oz  . Drug use: Not on file  . Sexual activity: Not on file  Lifestyle  . Physical activity:    Days per week: Not on file    Minutes per session: Not on file  . Stress: Not on file  Relationships  . Social connections:    Talks on phone: Not on file    Gets together: Not on file    Attends religious service: Not on file    Active  member of club or organization: Not on file    Attends meetings of clubs or organizations: Not on file    Relationship status: Not on file  . Intimate partner violence:    Fear of current or ex partner: Not on file    Emotionally abused: Not on file    Physically abused: Not on file    Forced sexual activity: Not on file  Other Topics Concern  . Not on file  Social History Narrative  . Not on file    Family History:    Family History  Problem Relation Age of Onset  . Rheumatic fever Mother  in her early 68's  . Alzheimer's disease Mother   . Stomach cancer Father 33       died 67  . Bradycardia Brother        has pacemaker  . Heart failure Paternal Grandmother   . Heart failure Paternal Grandfather      ROS:  Please see the history of present illness.  All other ROS reviewed and negative.     Physical Exam/Data:   Vitals:   09/17/17 0900 09/17/17 0930 09/17/17 0945 09/17/17 1000  BP: 104/72 (!) 118/98  (!) 86/51  Pulse: (!) 105 (!) 117 (!) 127 (!) 116  Resp: (!) 32 (!) 48 (!) 30 (!) 34  Temp:      TempSrc:      SpO2: 96% 96% 92% 94%  Weight: 235 lb 3.7 oz (106.7 kg)     Height:        Intake/Output Summary (Last 24 hours) at 09/17/2017 1024 Last data filed at 09/17/2017 0900 Gross per 24 hour  Intake 784.55 ml  Output 950 ml  Net -165.45 ml   Filed Weights   09/16/17 0600 09/17/17 0500 09/17/17 0900  Weight: 192 lb 3.2 oz (87.2 kg) 208 lb 8.9 oz (94.6 kg) 235 lb 3.7 oz (106.7 kg)   Body mass index is 40.38 kg/m.  General:  Well nourished, well developed, in no acute distress, OOB to chair, just finished eating breakfast HEENT: normal Lymph: no adenopathy Neck: +9 JVD Endocrine:  No thryomegaly Vascular: No carotid bruits Cardiac:  iRRR; tachycardic, 1/6 SM, no gallops or rubs is appreciated Lungs:  Decreased at bases b/l, no wheezing, rhonchi or rales  Abd: soft, nontender Ext: 2+ edema Musculoskeletal:  No deformities Skin: warm and dry    Neuro:   No gross focal abnormalities noted Psych:  Normal affect   EKG:  The EKG was personally reviewed and demonstrates:   09/11/17 AFib, LBBB, 106bpm, QRS 14ms 08/20/16 SR 74bpm, PACs, LBBB Telemetry:  Telemetry was personally reviewed and demonstrates:  SR 90's >> AFib 110's  Relevant CV Studies:  Echo 09/09/2017  Left ventricle: The cavity size was mildly dilated. Wall thickness was increased in a pattern of moderate LVH. Systolic function was severely reduced. The estimated ejection fraction was = 20%. Diffuse hypokinesis. The study was not technically sufficient to allow evaluation of LV diastolic dysfunction due to atrial fibrillation. - Aortic valve: Mildly calcified annulus. Trileaflet; normal thickness leaflets. Valve area (VTI): 2.39 cm^2. Valve area (Vmax): 2.18 cm^2. - Mitral valve: There was mild regurgitation. - Left atrium: The atrium was severely dilated. - Right ventricle: The cavity size was mildly dilated. Systolic function was mildly to moderately reduced. - Right atrium: The atrium was moderately dilated. - Atrial septum: No defect or patent foramen ovale was identified. - Pulmonary arteries: Systolic pressure was mildly increased. PA peak pressure: 37 mm Hg (S).   08/14/15: R/LHC 1. Angiographically normal coronary arteries 2. Nonischemic cardiomyopathy - conflicting cardiac outputs between Fick and thermodilution. Suspect that there is some valvular disease that is throwing off the thermodilution. 3. Essentially normal right heart pressures. Mildly elevated LVEDP and PCWP, but otherwise normal pressures.   Clearly nonischemic cardiomyopathy. The patient's itching anatomy with a tandem LAD system and codominant.  Plan:  Discharge after bedrest following femoral sheath removal.  Continue current management. She appears euvolemic   Laboratory Data:  Chemistry Recent Labs  Lab 09/16/17 0200 09/16/17 2104 09/17/17 0500  NA 143  141 142  K 3.8 4.3  4.2  CL 106 102 106  CO2 26 25 24   GLUCOSE 129* 198* 169*  BUN 67* 68* 65*  CREATININE 2.12* 2.23* 2.10*  CALCIUM 8.1* 8.4* 8.1*  GFRNONAA 22* 20* 22*  GFRAA 25* 23* 25*  ANIONGAP 11 14 12     Recent Labs  Lab 09/15/17 0422 09/16/17 0200 09/17/17 0500  PROT 4.9* 5.0* 5.0*  ALBUMIN 2.5* 2.6* 2.6*  AST 26 19 16   ALT 77* 61* 49*  ALKPHOS 50 52 57  BILITOT 0.7 0.5 0.8   Hematology Recent Labs  Lab 09/14/17 0400 09/15/17 0422 09/17/17 0830  WBC 4.1 4.2 4.2  RBC 3.66* 3.78* 3.62*  HGB 11.0* 11.3* 11.0*  HCT 36.4 37.8 36.5  MCV 99.5 100.0 100.8*  MCH 30.1 29.9 30.4  MCHC 30.2 29.9* 30.1  RDW 18.6* 18.7* 18.6*  PLT 220 225 216   Cardiac EnzymesNo results for input(s): TROPONINI in the last 168 hours. No results for input(s): TROPIPOC in the last 168 hours.  BNPNo results for input(s): BNP, PROBNP in the last 168 hours.  DDimer No results for input(s): DDIMER in the last 168 hours.  Radiology/Studies:   Dg Chest Port 1 View Result Date: 09/16/2017 CLINICAL DATA:  Shortness of breath EXAM: PORTABLE CHEST 1 VIEW COMPARISON:  09/09/2017 FINDINGS: Prominent enlargement of the cardiopericardial silhouette with bilateral indistinct interstitial accentuation and bibasilar airspace opacities. The airspace opacities are increased from prior. Atherosclerotic calcification of the aortic arch. Right IJ line tip: SVC. No appreciable pneumothorax. IMPRESSION: 1. Prominent enlargement of the cardiopericardial silhouette with interstitial pulmonary edema. 2. Bibasilar airspace opacities possibly from layering effusions, atelectasis, or bibasilar pneumonia. 3.  Aortic Atherosclerosis (ICD10-I70.0). Electronically Signed   By: Van Clines M.D.   On: 09/16/2017 19:08    Assessment and Plan:   1. Persistent AFib     CHA2DS2Vasc is 4, on Eliquis, appropriately dosed at 5mg  BID  Poorly tolerated AFib New diagnosis for her in June this year  On amiodarone gtt, dig  added yesterday, no other rate controlling meds (BB, CCB) given low output failure  2. NICM 3. LBBB w/QRS 160ms     Dates back to 2015, in review of record pt has been hesitant about ICD      She is s/p DCCV (x2) this admission, interruption of her a/c for procedure is not possible.  AVNode ablation w/CRT-D implant is an option, I wonder though if rate controlled AF w/BiVe pacing is the best option  Is she intolerant from the AFib or rates?  Ablation will leave her in AF  Amiodarone is a good AAD option, continue gtt  Device implant timing is would be best if we could hold off until we are able to hold her Eliquis  Will discuss with Dr. Caryl Comes, he will see her later today.   For questions or updates, please contact [place] Please consult [email or web address] for contact info under Cardiology/STEMI.   Signed, Baldwin Jamaica, PA-C  09/17/2017 10:24 AM   Atrial fibrillation-persistent with a rapid ventricular response  Nonischemic cardia myopathy  Left bundle branch block  Congestive heart failure-acute on chronic-class IV status post pressors  Congestive hepatopathy  Renal insufficiency grade 4   The patient has congestive heart failure in the setting of persistent atrial fibrillation with a rapid rate in the context of long-standing nonischemic cardiomyopathy and severe left atrial enlargement (greater than 60 mm) I think the likelihood that we will be able to maintain sinus rhythm is small.  It is noteworthy  that her atrial fibrillation antedated her presentation by at least a couple of weeks based on her home pulse oximeter.  It would be nice however, if we could maintain sinus rhythm for 24-48 hours to optimize her for device implantation.  And have discussed with Dr. DM possible of repeat cardioversion following a little bit more intravenous amiodarone.    I agree with Dr. DM that AV junction ablation and CRT therapy is probably our best strategy given the left atrial  size making the maintenance of sinus rhythm exceedingly unlikely.  I have reviewed this with the patient who is in agreement.  We will reassess timing in the morning trying to optimize her hemodynamics prior to submitting her for pacing  For now we will increase amiodarone dosing in hopes of further slowing her heart rate.  Hypotension precludes use of beta-blockers.

## 2017-09-18 ENCOUNTER — Inpatient Hospital Stay (HOSPITAL_COMMUNITY): Payer: Medicare Other | Admitting: Anesthesiology

## 2017-09-18 ENCOUNTER — Encounter (HOSPITAL_COMMUNITY): Payer: Self-pay | Admitting: Anesthesiology

## 2017-09-18 ENCOUNTER — Encounter (HOSPITAL_COMMUNITY)
Admission: EM | Disposition: A | Discharge status: Skilled Nursing Facility | Payer: Self-pay | Source: Home / Self Care | Attending: Cardiology

## 2017-09-18 DIAGNOSIS — I4891 Unspecified atrial fibrillation: Secondary | ICD-10-CM

## 2017-09-18 HISTORY — PX: CARDIOVERSION: SHX1299

## 2017-09-18 LAB — CBC WITH DIFFERENTIAL/PLATELET
Abs Immature Granulocytes: 0 10*3/uL (ref 0.0–0.1)
Basophils Absolute: 0 10*3/uL (ref 0.0–0.1)
Basophils Relative: 1 %
Eosinophils Absolute: 0.1 10*3/uL (ref 0.0–0.7)
Eosinophils Relative: 3 %
HCT: 37.2 % (ref 36.0–46.0)
Hemoglobin: 10.9 g/dL — ABNORMAL LOW (ref 12.0–15.0)
Immature Granulocytes: 1 %
Lymphocytes Relative: 14 %
Lymphs Abs: 0.6 10*3/uL — ABNORMAL LOW (ref 0.7–4.0)
MCH: 29.5 pg (ref 26.0–34.0)
MCHC: 29.3 g/dL — ABNORMAL LOW (ref 30.0–36.0)
MCV: 100.5 fL — ABNORMAL HIGH (ref 78.0–100.0)
Monocytes Absolute: 0.4 10*3/uL (ref 0.1–1.0)
Monocytes Relative: 9 %
Neutro Abs: 3 10*3/uL (ref 1.7–7.7)
Neutrophils Relative %: 72 %
Platelets: 231 10*3/uL (ref 150–400)
RBC: 3.7 MIL/uL — ABNORMAL LOW (ref 3.87–5.11)
RDW: 18 % — ABNORMAL HIGH (ref 11.5–15.5)
WBC: 4.1 10*3/uL (ref 4.0–10.5)

## 2017-09-18 LAB — COMPREHENSIVE METABOLIC PANEL
ALT: 43 U/L (ref 0–44)
AST: 14 U/L — ABNORMAL LOW (ref 15–41)
Albumin: 2.8 g/dL — ABNORMAL LOW (ref 3.5–5.0)
Alkaline Phosphatase: 61 U/L (ref 38–126)
Anion gap: 11 (ref 5–15)
BUN: 71 mg/dL — ABNORMAL HIGH (ref 8–23)
CO2: 26 mmol/L (ref 22–32)
Calcium: 8.3 mg/dL — ABNORMAL LOW (ref 8.9–10.3)
Chloride: 104 mmol/L (ref 98–111)
Creatinine, Ser: 2.35 mg/dL — ABNORMAL HIGH (ref 0.44–1.00)
GFR calc Af Amer: 22 mL/min — ABNORMAL LOW (ref >60)
GFR calc non Af Amer: 19 mL/min — ABNORMAL LOW (ref >60)
Glucose, Bld: 134 mg/dL — ABNORMAL HIGH (ref 70–99)
Potassium: 3.6 mmol/L (ref 3.5–5.1)
Sodium: 141 mmol/L (ref 135–145)
Total Bilirubin: 0.8 mg/dL (ref 0.3–1.2)
Total Protein: 5.2 g/dL — ABNORMAL LOW (ref 6.5–8.1)

## 2017-09-18 LAB — GLUCOSE, CAPILLARY: Glucose-Capillary: 135 mg/dL — ABNORMAL HIGH (ref 70–99)

## 2017-09-18 LAB — MAGNESIUM: Magnesium: 2.8 mg/dL — ABNORMAL HIGH (ref 1.7–2.4)

## 2017-09-18 LAB — SURGICAL PCR SCREEN
MRSA, PCR: NEGATIVE
Staphylococcus aureus: POSITIVE — AB

## 2017-09-18 LAB — COOXEMETRY PANEL
Carboxyhemoglobin: 1.6 % — ABNORMAL HIGH (ref 0.5–1.5)
Methemoglobin: 1.4 % (ref 0.0–1.5)
O2 Saturation: 59.7 %
Total hemoglobin: 10.7 g/dL — ABNORMAL LOW (ref 12.0–16.0)

## 2017-09-18 SURGERY — CARDIOVERSION
Anesthesia: General

## 2017-09-18 MED ORDER — CHLORHEXIDINE GLUCONATE 4 % EX LIQD
60.0000 mL | Freq: Once | CUTANEOUS | Status: AC
  Administered 2017-09-19: 4 via TOPICAL
  Filled 2017-09-18: qty 60

## 2017-09-18 MED ORDER — SODIUM CHLORIDE 0.9% FLUSH: 3.0000 mL | Freq: Two times a day (BID) | INTRAVENOUS | Status: DC

## 2017-09-18 MED ORDER — SODIUM CHLORIDE 0.9 % IV SOLN
80.0000 mg | INTRAVENOUS | Status: AC
  Administered 2017-09-19: 80 mg
  Filled 2017-09-18 (×2): qty 2

## 2017-09-18 MED ORDER — METOLAZONE 2.5 MG PO TABS
2.5000 mg | ORAL_TABLET | Freq: Once | ORAL | Status: AC
  Administered 2017-09-18: 2.5 mg via ORAL
  Filled 2017-09-18: qty 1

## 2017-09-18 MED ORDER — SODIUM CHLORIDE 0.9 % IV SOLN: 250.0000 mL | INTRAVENOUS | Status: DC

## 2017-09-18 MED ORDER — SODIUM CHLORIDE 0.9% FLUSH: 3.0000 mL | INTRAVENOUS | Status: DC | PRN

## 2017-09-18 MED ORDER — VANCOMYCIN HCL 10 G IV SOLR
1500.0000 mg | INTRAVENOUS | Status: AC
  Administered 2017-09-19: 1500 mg via INTRAVENOUS
  Filled 2017-09-18: qty 1500

## 2017-09-18 MED ORDER — ETOMIDATE 2 MG/ML IV SOLN
INTRAVENOUS | Status: DC | PRN
  Administered 2017-09-18: 16 mg via INTRAVENOUS

## 2017-09-18 MED ORDER — CHLORHEXIDINE GLUCONATE 4 % EX LIQD
60.0000 mL | Freq: Once | CUTANEOUS | Status: AC
  Administered 2017-09-18: 4 via TOPICAL
  Filled 2017-09-18: qty 60

## 2017-09-18 MED ORDER — AMIODARONE HCL IN DEXTROSE 360-4.14 MG/200ML-% IV SOLN
60.0000 mg/h | INTRAVENOUS | Status: DC
  Administered 2017-09-18: 30 mg/h via INTRAVENOUS
  Administered 2017-09-19 (×3): 60 mg/h via INTRAVENOUS
  Filled 2017-09-18 (×4): qty 200

## 2017-09-18 MED ORDER — SODIUM CHLORIDE 0.9 % IV SOLN: INTRAVENOUS | Status: DC

## 2017-09-18 MED ORDER — SODIUM CHLORIDE 0.9 % IV SOLN
250.0000 mL | INTRAVENOUS | Status: DC
  Administered 2017-09-18: 08:00:00 via INTRAVENOUS

## 2017-09-18 MED ORDER — CHLORHEXIDINE GLUCONATE CLOTH 2 % EX PADS
6.0000 | MEDICATED_PAD | Freq: Every day | CUTANEOUS | Status: AC
  Administered 2017-09-20 – 2017-09-22 (×2): 6 via TOPICAL

## 2017-09-18 MED ORDER — MUPIROCIN 2 % EX OINT
1.0000 "application " | TOPICAL_OINTMENT | Freq: Two times a day (BID) | CUTANEOUS | Status: AC
  Administered 2017-09-18 – 2017-09-22 (×10): 1 via NASAL
  Filled 2017-09-18: qty 22

## 2017-09-18 NOTE — Anesthesia Procedure Notes (Signed)
Procedure Name: General with mask airway Date/Time: 09/18/2017 8:20 AM Performed by: Renato Shin, CRNA Pre-anesthesia Checklist: Patient identified, Emergency Drugs available, Suction available and Patient being monitored Patient Re-evaluated:Patient Re-evaluated prior to induction Oxygen Delivery Method: Ambu bag Preoxygenation: Pre-oxygenation with 100% oxygen Induction Type: IV induction Ventilation: Mask ventilation without difficulty Placement Confirmation: positive ETCO2,  CO2 detector and breath sounds checked- equal and bilateral Dental Injury: Teeth and Oropharynx as per pre-operative assessment

## 2017-09-18 NOTE — Anesthesia Postprocedure Evaluation (Signed)
Anesthesia Post Note  Patient: Gina [name]  Procedure(s) Performed: CARDIOVERSION FOR ATRIAL FIBRILLATION (N/A )     Patient location during evaluation: PACU Anesthesia Type: General Level of consciousness: awake and alert Pain management: pain level controlled Vital Signs Assessment: post-procedure vital signs reviewed and stable Respiratory status: spontaneous breathing, nonlabored ventilation, respiratory function stable and patient connected to nasal cannula oxygen Cardiovascular status: blood pressure returned to baseline and stable Postop Assessment: no apparent nausea or vomiting Anesthetic complications: no    Last Vitals:  Vitals:   09/18/17 0837 09/18/17 0838  BP:    Pulse: (!) 54 (!) 53  Resp: 17 16  Temp:    SpO2: 98% 98%    Last Pain:  Vitals:   09/18/17 0812  TempSrc: Oral  PainSc:                  [name] DAVID

## 2017-09-18 NOTE — Progress Notes (Signed)
Patient ID: Gina [name], female   DOB: [birth year], 77 y.o.   MRN: [id number]     Advanced Heart Failure Rounding Note  PCP-Cardiologist: Carlyle Dolly, MD   Subjective:    Transferred to [facility] 7/10.   S/P DC-CV with 7/11 conversion to NSR. Developed bradycardia so IV amio stopped and po amio started. Around 2200 on 7/11 she went back into A fib RVR.   Repeat DCCV to NSR 7/14.   She went back into atrial fibrillation with RVR 7/16 evening.  Currently on IV amiodarone gtt with HR in 110s.  In this setting, she became more short of breath with pulmonary edema on CXR.    Co-ox down to 51% yesterday, milrinone restarted at 0.25 along with Lasix gtt.  This morning, co-ox 60% with CVP 13.  She is feeling better.  Creatinine up a bit to 2.3.    Objective:   Weight Range: 257 lb 4.4 oz (116.7 kg) Body mass index is 44.16 kg/m.   Vital Signs:   Temp:  [97.7 F (36.5 C)-98.4 F (36.9 C)] 97.7 F (36.5 C) (07/18 0400) Pulse Rate:  [36-127] 65 (07/18 0600) Resp:  [12-48] 17 (07/18 0600) BP: (86-128)/(51-108) 121/84 (07/18 0600) SpO2:  [90 %-99 %] 90 % (07/18 0600) Weight:  [235 lb 3.7 oz (106.7 kg)-257 lb 4.4 oz (116.7 kg)] 257 lb 4.4 oz (116.7 kg) (07/18 0355) Last BM Date: 09/15/17  Weight change: Filed Weights   09/17/17 0500 09/17/17 0900 09/18/17 0355  Weight: 208 lb 8.9 oz (94.6 kg) 235 lb 3.7 oz (106.7 kg) 257 lb 4.4 oz (116.7 kg)    Intake/Output:   Intake/Output Summary (Last 24 hours) at 09/18/2017 0734 Last data filed at 09/18/2017 0600 Gross per 24 hour  Intake 1752.13 ml  Output 1820 ml  Net -67.87 ml      Physical Exam   CVP 13 personally checked.  General: NAD Neck: JVP 12-14 cm, no thyromegaly or thyroid nodule.  Lungs: Clear to auscultation bilaterally with normal respiratory effort. CV: Nondisplaced PMI.  Heart irregular S1/S2, no S3/S4, 1/6 HSM LLSB.  1+ edema to knees.   Abdomen: Soft, nontender, no hepatosplenomegaly, no distention.  Skin: Intact  without lesions or rashes.  Neurologic: Alert and oriented x 3.  Psych: Normal affect. Extremities: No clubbing or cyanosis.  HEENT: Normal.     Telemetry   Atrial fibrillation 100s-110s, personally reviewed.   EKG    n/a  Labs    CBC Recent Labs    09/17/17 0830 09/18/17 0350  WBC 4.2 4.1  NEUTROABS  --  3.0  HGB 11.0* 10.9*  HCT 36.5 37.2  MCV 100.8* 100.5*  PLT 216 921   Basic Metabolic Panel Recent Labs    09/17/17 0500 09/18/17 0350  NA 142 141  K 4.2 3.6  CL 106 104  CO2 24 26  GLUCOSE 169* 134*  BUN 65* 71*  CREATININE 2.10* 2.35*  CALCIUM 8.1* 8.3*  MG 1.9 2.8*   Liver Function Tests Recent Labs    09/17/17 0500 09/18/17 0350  AST 16 14*  ALT 49* 43  ALKPHOS 57 61  BILITOT 0.8 0.8  PROT 5.0* 5.2*  ALBUMIN 2.6* 2.8*   No results for input(s): LIPASE, AMYLASE in the last 72 hours. Cardiac Enzymes No results for input(s): CKTOTAL, CKMB, CKMBINDEX, TROPONINI in the last 72 hours.  BNP: BNP (last 3 results) Recent Labs    09/02/17 1512  BNP >4,500.0*    ProBNP (last 3 results) No  results for input(s): PROBNP in the last 8760 hours.   D-Dimer No results for input(s): DDIMER in the last 72 hours. Hemoglobin A1C No results for input(s): HGBA1C in the last 72 hours. Fasting Lipid Panel No results for input(s): CHOL, HDL, LDLCALC, TRIG, CHOLHDL, LDLDIRECT in the last 72 hours. Thyroid Function Tests No results for input(s): TSH, T4TOTAL, T3FREE, THYROIDAB in the last 72 hours.  Invalid input(s): FREET3  Other results:   Imaging    No results found.   Medications:     Scheduled Medications: . apixaban  5 mg Oral BID  . Chlorhexidine Gluconate Cloth  6 each Topical Daily  . metolazone  2.5 mg Oral Once  . potassium chloride  40 mEq Oral Daily  . sodium chloride flush  10-40 mL Intracatheter Q12H  . sodium chloride flush  3 mL Intravenous Q12H  . sodium chloride flush  3 mL Intravenous Q12H  . spironolactone  25 mg Oral  Daily    Infusions: . sodium chloride    . sodium chloride    . amiodarone 60 mg/hr (09/18/17 0616)  . furosemide (LASIX) infusion 15 mg/hr (09/18/17 0400)  . milrinone 0.25 mcg/kg/min (09/18/17 0400)    PRN Medications: acetaminophen, bisacodyl, ipratropium, MUSCLE RUB, ondansetron (ZOFRAN) IV, sodium chloride, sodium chloride flush, sodium chloride flush, sodium chloride flush, traMADol    Patient Profile   Ms Smigel is a 77 year old with a history chronic systolic heart failure, anemia, htn, LBBB, renal artery stenosis, and OSA.   Transferred to [facility] on 7/10 with cardiogenic shock, Afib RVR, and volume overload.  Assessment/Plan   1. Acute on chronic systolic CHF: Echo (5/00) with EF 20%. Long-standing cardiomyopathy. Nonischemic based on cath in 2017. She had low output HF with co-ox 41%, CVP 17, AKI on CKD stage 3 ("cold and wet"). Exacerbation triggered by atrial fibrillation and also by being off Lasix for a number of days after hospitalization at [facility] for UTI.  She went back into atrial fibrillation with RVR 7/16 evening with worsening of CHF, CXR with pulmonary edema.  Think worsening triggered by going back into AF. She is now back on milrinone 0.25 and Lasix gtt 15.  Breathing better today though diuresis was not marked.  CVP 13 today with co-ox back up to 60%.  - Continue Lasix gtt at 15 mg/hr and will give dose of metolazone 2.5 x 1.  - With rise in creatinine, will hold digoxin.  - Continue milrinone 0.25.  - Continue spironolactone to 25 mg daily.   - We have had a hard time keeping her in NSR, she clearly decompensates in afib with RVR.  Rate difficult to control in afib.  Has wide LBBB, ultimately needs CRT.  Dr. Caryl Comes saw her, agrees that AV nodal ablation + CRT would be the best option.  Tentatively planning for tomorrow.  We will try to get him back into NSR today to make the procedure smoother tomorrow.   2. AKI on CKD stage 3: Suspect cardiorenal  syndrome with low output HF. No NSAID use.  Creatinine up from 2.1=>2.3 today, co-ox was low yesterday before restarting milrinone.  Hopefully better UOP will help. 3. Atrial fibrillation: Admitted with afib/RVR, had been in afib for about 6 wks. TSH normal.  Had been on Eliquis for about 4 weeks without missing any doses. Cardioverted to NSR 7/11 but back in atrial fibrillation with RVR by 7/12. Repeat DCCV to NSR in 7/14 after additional amiodarone loading but back in atrial  fibrillation with RVR evening of 7/16 and in [place] today.  She clearly worsens when she goes back into afib with RVR.  - She is back on IV amiodarone now at 60 mg/hr and continues Eliquis.  - As above, think she needs AV nodal ablation with BiV pacing.  Discussed with Dr. Caryl Comes, will plan on DCCV today to try to get her back in NSR for procedure tomorrow to make the procedure safer/smoother.  We discussed risks/benefits and she agrees to proceed.  4. Elevated LFTs: Suspect shock liver with low cardiac output. LFTs trended down.  5. ID: E coli UTI. She completed ceftriaxone.  6. Deconditioning: PT/OT consults. Out of bed. Will need SNF at discharge.    CRITICAL CARE Performed by: [name]  Total critical care time: 35 minutes  Critical care time was exclusive of separately billable procedures and treating other patients.  Critical care was necessary to treat or prevent imminent or life-threatening deterioration.  Critical care was time spent personally by me on the following activities: development of treatment plan with patient and/or surrogate as well as nursing, discussions with consultants, evaluation of patient's response to treatment, examination of patient, obtaining history from patient or surrogate, ordering and performing treatments and interventions, ordering and review of laboratory studies, ordering and review of radiographic studies, pulse oximetry and re-evaluation of patient's condition.  [name] 09/18/2017 7:34 AM

## 2017-09-18 NOTE — Progress Notes (Signed)
Responded to scc to assist with AD.  Patient just returned from procedure as will most likely not be able to discuss AD today per nurse.  Pt nurse will give AD to patient when ready and then have Chaplain paged .  Chaplain available as needed.  Jaclynn Major, [place], [facility], Pager [phone]

## 2017-09-18 NOTE — H&P (View-Only) (Signed)
Patient ID: Gina [name], female   DOB: [birth year], 77 y.o.   MRN: [id number]     Advanced Heart Failure Rounding Note  PCP-Cardiologist: Carlyle Dolly, MD   Subjective:    Transferred to [facility] 7/10.   S/P DC-CV with 7/11 conversion to NSR. Developed bradycardia so IV amio stopped and po amio started. Around 2200 on 7/11 she went back into A fib RVR.   Repeat DCCV to NSR 7/14.   She went back into atrial fibrillation with RVR 7/16 evening.  Currently on IV amiodarone gtt with HR in 110s.  In this setting, she became more short of breath with pulmonary edema on CXR.    Co-ox down to 51% yesterday, milrinone restarted at 0.25 along with Lasix gtt.  This morning, co-ox 60% with CVP 13.  She is feeling better.  Creatinine up a bit to 2.3.    Objective:   Weight Range: 257 lb 4.4 oz (116.7 kg) Body mass index is 44.16 kg/m.   Vital Signs:   Temp:  [97.7 F (36.5 C)-98.4 F (36.9 C)] 97.7 F (36.5 C) (07/18 0400) Pulse Rate:  [36-127] 65 (07/18 0600) Resp:  [12-48] 17 (07/18 0600) BP: (86-128)/(51-108) 121/84 (07/18 0600) SpO2:  [90 %-99 %] 90 % (07/18 0600) Weight:  [235 lb 3.7 oz (106.7 kg)-257 lb 4.4 oz (116.7 kg)] 257 lb 4.4 oz (116.7 kg) (07/18 0355) Last BM Date: 09/15/17  Weight change: Filed Weights   09/17/17 0500 09/17/17 0900 09/18/17 0355  Weight: 208 lb 8.9 oz (94.6 kg) 235 lb 3.7 oz (106.7 kg) 257 lb 4.4 oz (116.7 kg)    Intake/Output:   Intake/Output Summary (Last 24 hours) at 09/18/2017 0734 Last data filed at 09/18/2017 0600 Gross per 24 hour  Intake 1752.13 ml  Output 1820 ml  Net -67.87 ml      Physical Exam   CVP 13 personally checked.  General: NAD Neck: JVP 12-14 cm, no thyromegaly or thyroid nodule.  Lungs: Clear to auscultation bilaterally with normal respiratory effort. CV: Nondisplaced PMI.  Heart irregular S1/S2, no S3/S4, 1/6 HSM LLSB.  1+ edema to knees.   Abdomen: Soft, nontender, no hepatosplenomegaly, no distention.  Skin: Intact  without lesions or rashes.  Neurologic: Alert and oriented x 3.  Psych: Normal affect. Extremities: No clubbing or cyanosis.  HEENT: Normal.     Telemetry   Atrial fibrillation 100s-110s, personally reviewed.   EKG    n/a  Labs    CBC Recent Labs    09/17/17 0830 09/18/17 0350  WBC 4.2 4.1  NEUTROABS  --  3.0  HGB 11.0* 10.9*  HCT 36.5 37.2  MCV 100.8* 100.5*  PLT 216 242   Basic Metabolic Panel Recent Labs    09/17/17 0500 09/18/17 0350  NA 142 141  K 4.2 3.6  CL 106 104  CO2 24 26  GLUCOSE 169* 134*  BUN 65* 71*  CREATININE 2.10* 2.35*  CALCIUM 8.1* 8.3*  MG 1.9 2.8*   Liver Function Tests Recent Labs    09/17/17 0500 09/18/17 0350  AST 16 14*  ALT 49* 43  ALKPHOS 57 61  BILITOT 0.8 0.8  PROT 5.0* 5.2*  ALBUMIN 2.6* 2.8*   No results for input(s): LIPASE, AMYLASE in the last 72 hours. Cardiac Enzymes No results for input(s): CKTOTAL, CKMB, CKMBINDEX, TROPONINI in the last 72 hours.  BNP: BNP (last 3 results) Recent Labs    09/02/17 1512  BNP >4,500.0*    ProBNP (last 3 results) No  results for input(s): PROBNP in the last 8760 hours.   D-Dimer No results for input(s): DDIMER in the last 72 hours. Hemoglobin A1C No results for input(s): HGBA1C in the last 72 hours. Fasting Lipid Panel No results for input(s): CHOL, HDL, LDLCALC, TRIG, CHOLHDL, LDLDIRECT in the last 72 hours. Thyroid Function Tests No results for input(s): TSH, T4TOTAL, T3FREE, THYROIDAB in the last 72 hours.  Invalid input(s): FREET3  Other results:   Imaging    No results found.   Medications:     Scheduled Medications: . apixaban  5 mg Oral BID  . Chlorhexidine Gluconate Cloth  6 each Topical Daily  . metolazone  2.5 mg Oral Once  . potassium chloride  40 mEq Oral Daily  . sodium chloride flush  10-40 mL Intracatheter Q12H  . sodium chloride flush  3 mL Intravenous Q12H  . sodium chloride flush  3 mL Intravenous Q12H  . spironolactone  25 mg Oral  Daily    Infusions: . sodium chloride    . sodium chloride    . amiodarone 60 mg/hr (09/18/17 0616)  . furosemide (LASIX) infusion 15 mg/hr (09/18/17 0400)  . milrinone 0.25 mcg/kg/min (09/18/17 0400)    PRN Medications: acetaminophen, bisacodyl, ipratropium, MUSCLE RUB, ondansetron (ZOFRAN) IV, sodium chloride, sodium chloride flush, sodium chloride flush, sodium chloride flush, traMADol    Patient Profile   Ms Cosby is a 77 year old with a history chronic systolic heart failure, anemia, htn, LBBB, renal artery stenosis, and OSA.   Transferred to [facility] on 7/10 with cardiogenic shock, Afib RVR, and volume overload.  Assessment/Plan   1. Acute on chronic systolic CHF: Echo (1/61) with EF 20%. Long-standing cardiomyopathy. Nonischemic based on cath in 2017. She had low output HF with co-ox 41%, CVP 17, AKI on CKD stage 3 ("cold and wet"). Exacerbation triggered by atrial fibrillation and also by being off Lasix for a number of days after hospitalization at [facility] for UTI.  She went back into atrial fibrillation with RVR 7/16 evening with worsening of CHF, CXR with pulmonary edema.  Think worsening triggered by going back into AF. She is now back on milrinone 0.25 and Lasix gtt 15.  Breathing better today though diuresis was not marked.  CVP 13 today with co-ox back up to 60%.  - Continue Lasix gtt at 15 mg/hr and will give dose of metolazone 2.5 x 1.  - With rise in creatinine, will hold digoxin.  - Continue milrinone 0.25.  - Continue spironolactone to 25 mg daily.   - We have had a hard time keeping her in NSR, she clearly decompensates in afib with RVR.  Rate difficult to control in afib.  Has wide LBBB, ultimately needs CRT.  Dr. Caryl Comes saw her, agrees that AV nodal ablation + CRT would be the best option.  Tentatively planning for tomorrow.  We will try to get him back into NSR today to make the procedure smoother tomorrow.   2. AKI on CKD stage 3: Suspect cardiorenal  syndrome with low output HF. No NSAID use.  Creatinine up from 2.1=>2.3 today, co-ox was low yesterday before restarting milrinone.  Hopefully better UOP will help. 3. Atrial fibrillation: Admitted with afib/RVR, had been in afib for about 6 wks. TSH normal.  Had been on Eliquis for about 4 weeks without missing any doses. Cardioverted to NSR 7/11 but back in atrial fibrillation with RVR by 7/12. Repeat DCCV to NSR in 7/14 after additional amiodarone loading but back in atrial  fibrillation with RVR evening of 7/16 and in [place] today.  She clearly worsens when she goes back into afib with RVR.  - She is back on IV amiodarone now at 60 mg/hr and continues Eliquis.  - As above, think she needs AV nodal ablation with BiV pacing.  Discussed with Dr. Caryl Comes, will plan on DCCV today to try to get her back in NSR for procedure tomorrow to make the procedure safer/smoother.  We discussed risks/benefits and she agrees to proceed.  4. Elevated LFTs: Suspect shock liver with low cardiac output. LFTs trended down.  5. ID: E coli UTI. She completed ceftriaxone.  6. Deconditioning: PT/OT consults. Out of bed. Will need SNF at discharge.    CRITICAL CARE Performed by: [name]  Total critical care time: 35 minutes  Critical care time was exclusive of separately billable procedures and treating other patients.  Critical care was necessary to treat or prevent imminent or life-threatening deterioration.  Critical care was time spent personally by me on the following activities: development of treatment plan with patient and/or surrogate as well as nursing, discussions with consultants, evaluation of patient's response to treatment, examination of patient, obtaining history from patient or surrogate, ordering and performing treatments and interventions, ordering and review of laboratory studies, ordering and review of radiographic studies, pulse oximetry and re-evaluation of patient's condition.  [name] 09/18/2017 7:34 AM

## 2017-09-18 NOTE — Progress Notes (Signed)
PULMONARY / CRITICAL CARE MEDICINE  Name: Gina [name] MRN: [id number] DOB: March 24, 1940    ADMISSION DATE:  09/02/2017 CONSULTATION DATE:  09/16/17  REFERRING MD :  Radford Pax  CHIEF COMPLAINT:  SOB   HISTORY OF PRESENT ILLNESS:  Gina [name] is a 77 y.o. female with a PMH including but not limited to sCHF (EF 20 from 09/09/17, followed by Dr. Harl Bowie), A.fib, CAD, HTN.  Admitted to [facility] 09/02/17 with SOB and worsening edema x 2 weeks felt to be due to AF with RVR, chronic sCHF, cardiogenic shock.  She was seen by cardiology and nephrology and was started on aggressive diuresis and dobutamine.   Transferred to [facility] 7/10 for evaluation by advanced heart failure team.  Dobutamine was changed to milrinone and lasix increased to 80mg  BID.  Amio + apixaban continued due to A.fib.  Underwent DCCV 7/11 and 7/14 with conversion to NSR (successful after first attempt 7/11 but then reverted to A.fib so DCCV repeated 7/14).  Cardiology planning for ICD placement in the future.  During her hospitalization, she has been treated with ceftriaxone for E.coli UTI.  Evening of 7/16, she developed dyspnea and increased WOB.  CXR suggested pulmonary edema.  She was started on BiPAP and PCCM was called to see in consultation.   SUBJECTIVE:  Tmax 98.4 / WBC 4.1.  Net neg 2.5L since admit.  Cardioverted this am per Anesthesia.  Remains on milrinone, lasix and amiodarone gtts.     VITAL SIGNS: Temp:  [97.7 F (36.5 C)-98.4 F (36.9 C)] 98 F (36.7 C) (07/18 0812) Pulse Rate:  [26-127] 53 (07/18 0838) Resp:  [12-48] 16 (07/18 0838) BP: (86-128)/(51-108) 114/64 (07/18 0836) SpO2:  [90 %-100 %] 98 % (07/18 0838) Weight:  [235 lb 3.7 oz (106.7 kg)-257 lb 4.4 oz (116.7 kg)] 257 lb 4.4 oz (116.7 kg) (07/18 0355)  PHYSICAL EXAMINATION: General:  Elderly female in NAD lying in bed HEENT: MM pink/moist Neuro: sedate, awakens to voice post cardioversion, drifts back to sleep, appropriate  CV: s1s2 rrr, no m/r/g PULM:  even/non-labored, lungs bilaterally clear anterior, diminished lower  OZ:DGUY, non-tender, bsx4 active  Extremities: warm/dry, 2+ pitting BLE edema  Skin: no rashes or lesions  Recent Labs  Lab 09/16/17 2104 09/17/17 0500 09/18/17 0350  NA 141 142 141  K 4.3 4.2 3.6  CL 102 106 104  CO2 25 24 26   BUN 68* 65* 71*  CREATININE 2.23* 2.10* 2.35*  GLUCOSE 198* 169* 134*   Recent Labs  Lab 09/15/17 0422 09/17/17 0830 09/18/17 0350  HGB 11.3* 11.0* 10.9*  HCT 37.8 36.5 37.2  WBC 4.2 4.2 4.1  PLT 225 216 231   Dg Chest Port 1 View  Result Date: 09/17/2017 CLINICAL DATA:  Shortness of breath.  Respiratory failure. EXAM: PORTABLE CHEST 1 VIEW COMPARISON:  One-view chest x-ray 09/16/2017 FINDINGS: The heart is enlarged. Mild edema is stable. A right IJ line is stable. Bilateral pleural effusions and airspace disease are unchanged. Defibrillator pad is in place over the right chest wall. IMPRESSION: 1. Stable cardiomegaly with interstitial edema and effusions compatible with congestive heart failure. 2. Bibasilar airspace disease likely reflects atelectasis. Infection is not excluded. 3. Right IJ line is stable. Electronically Signed   By: San Morelle M.D.   On: 09/17/2017 10:49   Dg Chest Port 1 View  Result Date: 09/16/2017 CLINICAL DATA:  Shortness of breath EXAM: PORTABLE CHEST 1 VIEW COMPARISON:  09/09/2017 FINDINGS: Prominent enlargement of the cardiopericardial silhouette with bilateral indistinct  interstitial accentuation and bibasilar airspace opacities. The airspace opacities are increased from prior. Atherosclerotic calcification of the aortic arch. Right IJ line tip: SVC. No appreciable pneumothorax. IMPRESSION: 1. Prominent enlargement of the cardiopericardial silhouette with interstitial pulmonary edema. 2. Bibasilar airspace opacities possibly from layering effusions, atelectasis, or bibasilar pneumonia. 3.  Aortic Atherosclerosis (ICD10-I70.0). Electronically Signed    By: Van Clines M.D.   On: 09/16/2017 19:08    STUDIES:  Echo 7/9 >> EF 20%, diffuse hypokinesis CXR 7/16 >> interstitial edema  SIGNIFICANT EVENTS  7/02 Admit to [facility]. 7/10 [facility]. 7/11 DCCV. 7/14 2nd DCCV. 7/16 PCCM consult.  CULTURES UC 7/13 >> E-Coli > S- ceftriaxone, zosyn          Proteus mirabilis > S- ceftriaxone, zosyn Sputum 7/17 >>   ANTIBIOTICS Ceftriaxone 7/10 >> 7/16 Zosyn 7/16 >>7/17  ASSESSMENT / PLAN:  Acute hypoxic respiratory failure - felt to be due to acute pulmonary edema in setting sCHF.  Pt has been started on BiPAP and subjectively feels better. Possible aspiration? - low likelihood. Former Tobacco Abuse - quit 1986, 25pk year hx P: BiPAP QHS & PRN daytime fatigue  Diuresis per Cardiology  Monitor I/O's  Follow intermittent CXR  O2 for sats >90% Monitor off abx   AoC sCHF - Echo from July 2019 with EF 20%. Hx A.fib with RVR (s/p DCCV, now in NSR), NICM. P: Per Cardiology  Monitor in ICU post cardioversion  Tele monitoring  Follow Co-Ox  TED hose to BLE,elevation  Lasix gtt, metolazone   E.coli UTI - had been on ceftriaxone; however, abx broadened 7/16 for possible aspiration. P: Completed abx, monitor off    Generalized deconditioning. P: PT / OT    Rest Per Primary Team.    Gina Gens, NP-C [facility] Pgr: [phone] or if no answer [phone] 09/18/2017, 8:54 AM

## 2017-09-18 NOTE — Transfer of Care (Signed)
Immediate Anesthesia Transfer of Care Note  Patient: Gina [name]  Procedure(s) Performed: CARDIOVERSION FOR ATRIAL FIBRILLATION (N/A )  Patient Location: SICU  Anesthesia Type:General  Level of Consciousness: awake, alert , drowsy and patient cooperative  Airway & Oxygen Therapy: Patient Spontanous Breathing and Patient connected to nasal cannula oxygen  Post-op Assessment: Report given to RN and Post -op Vital signs reviewed and stable  Post vital signs: Reviewed and stable  Last Vitals:  Vitals Value Taken Time  BP 114/64 09/18/2017  8:36 AM  Temp    Pulse 53 09/18/2017  8:38 AM  Resp 16 09/18/2017  8:38 AM  SpO2 98 % 09/18/2017  8:38 AM    Last Pain:  Vitals:   09/18/17 0812  TempSrc: Oral  PainSc:       Patients Stated Pain Goal: 3 (64/31/42 7670)  Complications: No apparent anesthesia complications

## 2017-09-18 NOTE — Progress Notes (Signed)
Dr. Aundra Dubin ordered for amio gtt to run at 30mg /hr. Pt is currently in [place] 50's. Dr. Aundra Dubin wants amio gtt to increase to 48ml/hr if pt goes back into afib RVR. Will continue to monitor.

## 2017-09-18 NOTE — Progress Notes (Signed)
CSW continuing to follow for transition to [facility] SNF when stable  Jorge Ny, [place] Social Worker [phone]

## 2017-09-18 NOTE — Procedures (Signed)
Electrical Cardioversion Procedure Note ANNORA GUDERIAN [id number] May 22, 1940  Procedure: Electrical Cardioversion Indications:  Atrial Fibrillation  Procedure Details Consent: Risks of procedure as well as the alternatives and risks of each were explained to the (patient/caregiver).  Consent for procedure obtained. Time Out: Verified patient identification, verified procedure, site/side was marked, verified correct patient position, special equipment/implants available, medications/allergies/relevent history reviewed, required imaging and test results available.  Performed  Patient placed on cardiac monitor, pulse oximetry, supplemental oxygen as necessary.  Sedation given: Propofol per anesthesiology Pacer pads placed anterior and posterior chest.  Cardioverted 3 time(s).  Cardioverted at 200J with sternal pressure.   Evaluation Findings: Post procedure EKG shows: NSR Complications: None Patient did tolerate procedure well.   [name] 09/18/2017, 8:33 AM

## 2017-09-18 NOTE — Plan of Care (Signed)
  Problem: Safety: Goal: Ability to remain free from injury will improve Outcome: Progressing   Problem: Skin Integrity: Goal: Risk for impaired skin integrity will decrease Outcome: Progressing   

## 2017-09-18 NOTE — Progress Notes (Signed)
PT Cancellation Note  Patient Details Name: Gina [name] MRN: [id number] DOB: 10-25-1940   Cancelled Treatment:     pt currently bathing with RN, RN also requests we come back tomorrow after pacemaker placement as she was just cardioverted today and concerned for exertion. PT will cont to follow.  Reinaldo Berber, PT, DPT Acute Rehab Services Pager: [phone]     Reinaldo Berber 09/18/2017, 3:12 PM

## 2017-09-18 NOTE — Anesthesia Preprocedure Evaluation (Signed)
Anesthesia Evaluation  Patient identified by MRN, date of birth, ID band Patient awake    Reviewed: Allergy & Precautions, NPO status , Patient's Chart, lab work & pertinent test results  Airway Mallampati: I  TM Distance: >3 FB Neck ROM: Full    Dental   Pulmonary sleep apnea , former smoker,    Pulmonary exam normal        Cardiovascular hypertension, Pt. on medications + CAD and +CHF  Normal cardiovascular exam+ dysrhythmias Atrial Fibrillation      Neuro/Psych    GI/Hepatic   Endo/Other    Renal/GU Renal InsufficiencyRenal disease     Musculoskeletal   Abdominal   Peds  Hematology   Anesthesia Other Findings   Reproductive/Obstetrics                             Anesthesia Physical Anesthesia Plan  ASA: III  Anesthesia Plan: General   Post-op Pain Management:    Induction: Intravenous  PONV Risk Score and Plan: 3 and Treatment may vary due to age or medical condition  Airway Management Planned: Mask  Additional Equipment:   Intra-op Plan:   Post-operative Plan:   Informed Consent: I have reviewed the patients History and Physical, chart, labs and discussed the procedure including the risks, benefits and alternatives for the proposed anesthesia with the patient or authorized representative who has indicated his/her understanding and acceptance.     Plan Discussed with: CRNA and Surgeon  Anesthesia Plan Comments:         Anesthesia Quick Evaluation

## 2017-09-18 NOTE — Progress Notes (Signed)
Pt went into brief run of Afib RVR (rate 120s) during bath. Amio increased to 60mg /hr per Dr. Claris Gladden order given to day shift. Then converted back to SR w/ocasional PACs. Will maintain gtt at 60mg /hr. Pt denies any dizziness, nausea. SOB on exertion only.   Will continue to monitor.

## 2017-09-18 NOTE — Interval H&P Note (Signed)
History and Physical Interval Note:  09/18/2017 8:22 AM  Gina [name]  has presented today for surgery, with the diagnosis of atrial fibrillation  The various methods of treatment have been discussed with the patient and family. After consideration of risks, benefits and other options for treatment, the patient has consented to  Procedure(s): CARDIOVERSION FOR ATRIAL FIBRILLATION (N/A) as a surgical intervention .  The patient's history has been reviewed, patient examined, no change in status, stable for surgery.  I have reviewed the patient's chart and labs.  Questions were answered to the patient's satisfaction.     [place] [employer]

## 2017-09-18 NOTE — Progress Notes (Signed)
Advance directives given to patient. Pt requested for another family member to be here. Family is suppose to be here tomorrow for procedure. Will report to night shift RN for RN to follow up after procedure.

## 2017-09-18 NOTE — Progress Notes (Signed)
Progress Note  Patient Name: Gina [name] Date of Encounter: 09/18/2017  Primary Cardiologist: CHF Primary Electrophysiologist: SK   Patient Profile     77 y.o. female admitted with shock and AFib with RVR  Has NICM and LBBB She has had long-standing persistent left ventricular dysfunction and catheterization 6/17 demonstrated normal coronary arteries 6/19 echocardiogram EF 20% severe LAE (60)  Subjective   Improved today   Inpatient Medications    Scheduled Meds: . Chlorhexidine Gluconate Cloth  6 each Topical Daily  . Chlorhexidine Gluconate Cloth  6 each Topical Daily  . gentamicin irrigation  80 mg Irrigation On Call  . mupirocin ointment  1 application Nasal BID  . potassium chloride  40 mEq Oral Daily  . sodium chloride flush  10-40 mL Intracatheter Q12H  . spironolactone  25 mg Oral Daily   Continuous Infusions: . amiodarone 30 mg/hr (09/18/17 1300)  . furosemide (LASIX) infusion 15 mg/hr (09/18/17 1300)  . milrinone 0.25 mcg/kg/min (09/18/17 1300)  . vancomycin     PRN Meds: acetaminophen, bisacodyl, ipratropium, MUSCLE RUB, ondansetron (ZOFRAN) IV, sodium chloride, traMADol   Vital Signs    Vitals:   09/18/17 1130 09/18/17 1200 09/18/17 1209 09/18/17 1300  BP: 127/67 (!) 125/58  125/72  Pulse: 62 (!) 59  (!) 59  Resp: 19 16  (!) 21  Temp:   98.7 F (37.1 C)   TempSrc:   Oral   SpO2: 97% 100%  100%  Weight:      Height:        Intake/Output Summary (Last 24 hours) at 09/18/2017 1308 Last data filed at 09/18/2017 1300 Gross per 24 hour  Intake 1481.98 ml  Output 2340 ml  Net -858.02 ml   Filed Weights   09/17/17 0500 09/17/17 0900 09/18/17 0355  Weight: 208 lb 8.9 oz (94.6 kg) 235 lb 3.7 oz (106.7 kg) 257 lb 4.4 oz (116.7 kg)    Telemetry    Atrial fibrillation with rate 100-110- Personally Reviewed  ECG       Physical Exam    GEN: No acute distress on CPAP.   Neck: JVD 9 cm Cardiac: irregularly irregular rate  murmurs, rubs,  or gallops.  Respiratory: Clear to auscultation bilaterally. GI: Soft, nontender, non-distended  MS: 1 edema; No deformity. Neuro:  Nonfocal  Psych: Normal affect  Skin Warm and dry   Labs    Chemistry Recent Labs  Lab 09/16/17 0200 09/16/17 2104 09/17/17 0500 09/18/17 0350  NA 143 141 142 141  K 3.8 4.3 4.2 3.6  CL 106 102 106 104  CO2 26 25 24 26   GLUCOSE 129* 198* 169* 134*  BUN 67* 68* 65* 71*  CREATININE 2.12* 2.23* 2.10* 2.35*  CALCIUM 8.1* 8.4* 8.1* 8.3*  PROT 5.0*  --  5.0* 5.2*  ALBUMIN 2.6*  --  2.6* 2.8*  AST 19  --  16 14*  ALT 61*  --  49* 43  ALKPHOS 52  --  57 61  BILITOT 0.5  --  0.8 0.8  GFRNONAA 22* 20* 22* 19*  GFRAA 25* 23* 25* 22*  ANIONGAP 11 14 12 11      Hematology Recent Labs  Lab 09/15/17 0422 09/17/17 0830 09/18/17 0350  WBC 4.2 4.2 4.1  RBC 3.78* 3.62* 3.70*  HGB 11.3* 11.0* 10.9*  HCT 37.8 36.5 37.2  MCV 100.0 100.8* 100.5*  MCH 29.9 30.4 29.5  MCHC 29.9* 30.1 29.3*  RDW 18.7* 18.6* 18.0*  PLT 225 216 231  Cardiac EnzymesNo results for input(s): TROPONINI in the last 168 hours. No results for input(s): TROPIPOC in the last 168 hours.   BNPNo results for input(s): BNP, PROBNP in the last 168 hours.   DDimer No results for input(s): DDIMER in the last 168 hours.   Radiology    Dg Chest Port 1 View  Result Date: 09/17/2017 CLINICAL DATA:  Shortness of breath.  Respiratory failure. EXAM: PORTABLE CHEST 1 VIEW COMPARISON:  One-view chest x-ray 09/16/2017 FINDINGS: The heart is enlarged. Mild edema is stable. A right IJ line is stable. Bilateral pleural effusions and airspace disease are unchanged. Defibrillator pad is in place over the right chest wall. IMPRESSION: 1. Stable cardiomegaly with interstitial edema and effusions compatible with congestive heart failure. 2. Bibasilar airspace disease likely reflects atelectasis. Infection is not excluded. 3. Right IJ line is stable. Electronically Signed   By: San Morelle  M.D.   On: 09/17/2017 10:49   Dg Chest Port 1 View  Result Date: 09/16/2017 CLINICAL DATA:  Shortness of breath EXAM: PORTABLE CHEST 1 VIEW COMPARISON:  09/09/2017 FINDINGS: Prominent enlargement of the cardiopericardial silhouette with bilateral indistinct interstitial accentuation and bibasilar airspace opacities. The airspace opacities are increased from prior. Atherosclerotic calcification of the aortic arch. Right IJ line tip: SVC. No appreciable pneumothorax. IMPRESSION: 1. Prominent enlargement of the cardiopericardial silhouette with interstitial pulmonary edema. 2. Bibasilar airspace opacities possibly from layering effusions, atelectasis, or bibasilar pneumonia. 3.  Aortic Atherosclerosis (ICD10-I70.0). Electronically Signed   By: Van Clines M.D.   On: 09/16/2017 19:08        Assessment & Plan    CHF acute chronic 3b  Afib RVR  NICM  LBBB  Renal insufficiency gd 4  OSA CPAP  Milrinone Rx  She is better today  Have spoke with Dr DM regarding possible cardioversion to optimize hemodynamics  Will plan CRT-P in am with AV ablation   The benefits and risks were reviewed including but not limited to death,  perforation, infection, lead dislodgement and device malfunction.  The patient understands agrees and is willing to proceed.     Signed, Virl Axe, MD  09/18/2017, 1:08 PM

## 2017-09-19 ENCOUNTER — Inpatient Hospital Stay (HOSPITAL_COMMUNITY)
Admission: EM | Disposition: A | Discharge status: Skilled Nursing Facility | Payer: Self-pay | Source: Home / Self Care | Attending: Cardiology

## 2017-09-19 ENCOUNTER — Encounter (HOSPITAL_COMMUNITY): Payer: Self-pay | Admitting: Cardiology

## 2017-09-19 DIAGNOSIS — I4891 Unspecified atrial fibrillation: Secondary | ICD-10-CM

## 2017-09-19 DIAGNOSIS — I442 Atrioventricular block, complete: Secondary | ICD-10-CM

## 2017-09-19 HISTORY — PX: BIV PACEMAKER INSERTION CRT-P: EP1199

## 2017-09-19 HISTORY — PX: AV NODE ABLATION: EP1193

## 2017-09-19 LAB — COMPREHENSIVE METABOLIC PANEL
ALT: 35 U/L (ref 0–44)
AST: 15 U/L (ref 15–41)
Albumin: 2.8 g/dL — ABNORMAL LOW (ref 3.5–5.0)
Alkaline Phosphatase: 59 U/L (ref 38–126)
Anion gap: 12 (ref 5–15)
BUN: 73 mg/dL — ABNORMAL HIGH (ref 8–23)
CO2: 29 mmol/L (ref 22–32)
Calcium: 8.7 mg/dL — ABNORMAL LOW (ref 8.9–10.3)
Chloride: 99 mmol/L (ref 98–111)
Creatinine, Ser: 2.51 mg/dL — ABNORMAL HIGH (ref 0.44–1.00)
GFR calc Af Amer: 20 mL/min — ABNORMAL LOW (ref >60)
GFR calc non Af Amer: 18 mL/min — ABNORMAL LOW (ref >60)
Glucose, Bld: 123 mg/dL — ABNORMAL HIGH (ref 70–99)
Potassium: 3.5 mmol/L (ref 3.5–5.1)
Sodium: 140 mmol/L (ref 135–145)
Total Bilirubin: 0.8 mg/dL (ref 0.3–1.2)
Total Protein: 5.2 g/dL — ABNORMAL LOW (ref 6.5–8.1)

## 2017-09-19 LAB — CBC WITH DIFFERENTIAL/PLATELET
Abs Immature Granulocytes: 0 10*3/uL (ref 0.0–0.1)
Basophils Absolute: 0 10*3/uL (ref 0.0–0.1)
Basophils Relative: 1 %
Eosinophils Absolute: 0.3 10*3/uL (ref 0.0–0.7)
Eosinophils Relative: 6 %
HCT: 36.7 % (ref 36.0–46.0)
Hemoglobin: 11 g/dL — ABNORMAL LOW (ref 12.0–15.0)
Immature Granulocytes: 0 %
Lymphocytes Relative: 10 %
Lymphs Abs: 0.4 10*3/uL — ABNORMAL LOW (ref 0.7–4.0)
MCH: 29.3 pg (ref 26.0–34.0)
MCHC: 30 g/dL (ref 30.0–36.0)
MCV: 97.6 fL (ref 78.0–100.0)
Monocytes Absolute: 0.4 10*3/uL (ref 0.1–1.0)
Monocytes Relative: 8 %
Neutro Abs: 3.5 10*3/uL (ref 1.7–7.7)
Neutrophils Relative %: 75 %
Platelets: 250 10*3/uL (ref 150–400)
RBC: 3.76 MIL/uL — ABNORMAL LOW (ref 3.87–5.11)
RDW: 17.3 % — ABNORMAL HIGH (ref 11.5–15.5)
WBC: 4.7 10*3/uL (ref 4.0–10.5)

## 2017-09-19 LAB — COOXEMETRY PANEL
Carboxyhemoglobin: 1.6 % — ABNORMAL HIGH (ref 0.5–1.5)
Methemoglobin: 1.5 % (ref 0.0–1.5)
O2 Saturation: 63.8 %
Total hemoglobin: 11.3 g/dL — ABNORMAL LOW (ref 12.0–16.0)

## 2017-09-19 LAB — MAGNESIUM: Magnesium: 2.3 mg/dL (ref 1.7–2.4)

## 2017-09-19 SURGERY — BIV PACEMAKER INSERTION CRT-P

## 2017-09-19 MED ORDER — SODIUM CHLORIDE 0.9% FLUSH
3.0000 mL | Freq: Two times a day (BID) | INTRAVENOUS | Status: DC
  Administered 2017-09-20 – 2017-09-23 (×5): 3 mL via INTRAVENOUS

## 2017-09-19 MED ORDER — BUPIVACAINE HCL (PF) 0.25 % IJ SOLN
INTRAMUSCULAR | Status: AC
  Filled 2017-09-19: qty 30

## 2017-09-19 MED ORDER — LIDOCAINE HCL (PF) 1 % IJ SOLN
INTRAMUSCULAR | Status: AC
  Filled 2017-09-19: qty 60

## 2017-09-19 MED ORDER — IOPAMIDOL (ISOVUE-370) INJECTION 76%
INTRAVENOUS | Status: AC
  Filled 2017-09-19: qty 50

## 2017-09-19 MED ORDER — POTASSIUM CHLORIDE 10 MEQ/50ML IV SOLN
10.0000 meq | INTRAVENOUS | Status: AC
  Administered 2017-09-19 (×2): 10 meq via INTRAVENOUS
  Filled 2017-09-19 (×2): qty 50

## 2017-09-19 MED ORDER — VANCOMYCIN HCL IN DEXTROSE 1-5 GM/200ML-% IV SOLN
1000.0000 mg | Freq: Two times a day (BID) | INTRAVENOUS | Status: AC
  Administered 2017-09-21: 1000 mg via INTRAVENOUS
  Filled 2017-09-19: qty 200

## 2017-09-19 MED ORDER — FENTANYL CITRATE (PF) 100 MCG/2ML IJ SOLN
INTRAMUSCULAR | Status: DC | PRN
  Administered 2017-09-19 (×5): 12.5 ug via INTRAVENOUS

## 2017-09-19 MED ORDER — LIDOCAINE HCL (PF) 1 % IJ SOLN
INTRAMUSCULAR | Status: DC | PRN
  Administered 2017-09-19: 45 mL

## 2017-09-19 MED ORDER — SODIUM CHLORIDE 0.9% FLUSH: 3.0000 mL | INTRAVENOUS | Status: DC | PRN

## 2017-09-19 MED ORDER — CHLORHEXIDINE GLUCONATE 4 % EX LIQD: 60.0000 mL | Freq: Once | CUTANEOUS | Status: DC

## 2017-09-19 MED ORDER — IOPAMIDOL (ISOVUE-370) INJECTION 76%
INTRAVENOUS | Status: DC | PRN
  Administered 2017-09-19: 3 mL via INTRAVENOUS

## 2017-09-19 MED ORDER — ACETAMINOPHEN 325 MG PO TABS: 325.0000 mg | ORAL_TABLET | ORAL | Status: DC | PRN

## 2017-09-19 MED ORDER — SODIUM CHLORIDE 0.9 % IV SOLN: 80.0000 mg | INTRAVENOUS | Status: DC

## 2017-09-19 MED ORDER — BUPIVACAINE HCL (PF) 0.25 % IJ SOLN
INTRAMUSCULAR | Status: DC | PRN
  Administered 2017-09-19: 7 mL

## 2017-09-19 MED ORDER — SODIUM CHLORIDE 0.9 % IV SOLN
250.0000 mL | INTRAVENOUS | Status: DC | PRN
  Administered 2017-09-20: 250 mL via INTRAVENOUS

## 2017-09-19 MED ORDER — SODIUM CHLORIDE 0.9 % IV SOLN
INTRAVENOUS | Status: AC | PRN
  Administered 2017-09-19: 150 mL via INTRAVENOUS

## 2017-09-19 MED ORDER — HEPARIN (PORCINE) IN NACL 1000-0.9 UT/500ML-% IV SOLN
INTRAVENOUS | Status: AC
  Filled 2017-09-19: qty 500

## 2017-09-19 MED ORDER — ENOXAPARIN SODIUM 30 MG/0.3ML ~~LOC~~ SOLN
30.0000 mg | SUBCUTANEOUS | Status: DC
  Administered 2017-09-20 – 2017-09-21 (×2): 30 mg via SUBCUTANEOUS
  Filled 2017-09-19 (×2): qty 0.3

## 2017-09-19 MED ORDER — ACETAMINOPHEN 325 MG PO TABS
650.0000 mg | ORAL_TABLET | ORAL | Status: DC | PRN
  Administered 2017-09-20 – 2017-09-22 (×4): 650 mg via ORAL
  Filled 2017-09-19 (×4): qty 2

## 2017-09-19 MED ORDER — SODIUM CHLORIDE 0.9 % IV SOLN
INTRAVENOUS | Status: AC
  Administered 2017-09-19: 20:00:00 via INTRAVENOUS

## 2017-09-19 MED ORDER — MIDAZOLAM HCL 5 MG/5ML IJ SOLN
INTRAMUSCULAR | Status: AC
  Filled 2017-09-19: qty 5

## 2017-09-19 MED ORDER — ONDANSETRON HCL 4 MG/2ML IJ SOLN: 4.0000 mg | Freq: Four times a day (QID) | INTRAMUSCULAR | Status: DC | PRN

## 2017-09-19 MED ORDER — FENTANYL CITRATE (PF) 100 MCG/2ML IJ SOLN
INTRAMUSCULAR | Status: AC
  Filled 2017-09-19: qty 2

## 2017-09-19 MED ORDER — VANCOMYCIN HCL 10 G IV SOLR: 1500.0000 mg | INTRAVENOUS | Status: DC

## 2017-09-19 MED ORDER — SODIUM CHLORIDE 0.9 % IV SOLN: INTRAVENOUS | Status: AC

## 2017-09-19 MED ORDER — MIDAZOLAM HCL 5 MG/5ML IJ SOLN
INTRAMUSCULAR | Status: DC | PRN
  Administered 2017-09-19 (×5): 1 mg via INTRAVENOUS

## 2017-09-19 MED ORDER — SODIUM CHLORIDE 0.9 % IV SOLN: INTRAVENOUS | Status: DC

## 2017-09-19 MED ORDER — SODIUM CHLORIDE 0.9 % IV SOLN
INTRAVENOUS | Status: AC
  Filled 2017-09-19: qty 2

## 2017-09-19 SURGICAL SUPPLY — 24 items
ADAPTER SEALING SSA-EW-09 (MISCELLANEOUS) ×2 IMPLANT
BALLN COR SINUS VENO 6FR 80 (BALLOONS) ×2
BALLOON COR SINUS VENO 6FR 80 (BALLOONS) ×1 IMPLANT
CABLE SURGICAL S-101-97-12 (CABLE) ×2 IMPLANT
CATH ATTAIN COM SURV 6250V-MB2 (CATHETERS) ×2 IMPLANT
CATH ATTAIN SEL SURV 6248V-130 (CATHETERS) ×2 IMPLANT
CATH BLAZER 7FR 4MM LG 5031TK2 (ABLATOR) ×2 IMPLANT
CATH CPS DIRECT 135 DS2C020 (CATHETERS) ×2 IMPLANT
ELECT DEFIB PAD ADLT CADENCE (PAD) ×2 IMPLANT
HEMOSTAT SURGICEL 2X4 FIBR (HEMOSTASIS) ×2 IMPLANT
INTRODUCER SWARTZ SRO 8F (SHEATH) ×2 IMPLANT
KIT ESSENTIALS PG (KITS) ×2 IMPLANT
LEAD CAPSURE NOVUS 5076-52CM (Lead) ×2 IMPLANT
LEAD CAPSURE NOVUS 5076-58CM (Lead) ×2 IMPLANT
PACEMAKER ALLR CRT-P RF PM3222 (Pacemaker) ×1 IMPLANT
PAD DEFIB LIFELINK (PAD) ×2 IMPLANT
PPM ALLURE CRT-P RF PM3222 (Pacemaker) ×2 IMPLANT
SHEATH CLASSIC 7F (SHEATH) ×2 IMPLANT
SHEATH CLASSIC 8F (SHEATH) ×2 IMPLANT
SHEATH CLASSIC 9.5F (SHEATH) ×2 IMPLANT
SHEATH PINNACLE 8F 10CM (SHEATH) ×2 IMPLANT
TRAY PACEMAKER INSERTION (PACKS) ×2 IMPLANT
WIRE ACUITY WHISPER EDS 4648 (WIRE) ×2 IMPLANT
WIRE HI TORQ VERSACORE-J 145CM (WIRE) ×6 IMPLANT

## 2017-09-19 NOTE — Care Management Note (Signed)
Case Management Note Previous Note Created by [facility] Patient Details  Name: Gina [name] MRN: [id number] Date of Birth: August 04, 1940  Subjective/Objective:   CHF. From home with husband. Ind with ADL's. Has rollator walker. Active with [facility] home health for RN and PT.  Currently on room.       Action/Plan: DC home with home health. Santiago Glad of [facility] aware of admission and will follow for orders.   Expected Discharge Date:     09/04/2017             Expected Discharge Plan:  [place]  In-House Referral:     Discharge planning Services  CM Consult  Post Acute Care Choice:  Home Health, Resumption of Svcs/PTA Provider Choice offered to:  Patient  DME Arranged:    DME Agency:     HH Arranged:  RN, PT HH Agency:  [place]  Status of Service:  Completed, signed off  If discussed at [place] of Stay Meetings, dates discussed:    Additional Comments: 09/19/2017  Pt remains appropriate for continued stay.  Pt remains on IV milrinone and lasix drip. Pt has had unsuccessful cardioversion - another one planned for this afternoon.  CSW continuing to follow for SNF when medically stable 09/15/17 Pt in ICU at [facility].  SNF reccommended - CSW following Maryclare Labrador, RN 09/19/2017, 10:47 AM

## 2017-09-19 NOTE — Progress Notes (Signed)
   09/19/17 1215  Note  Observations received report from [facility] @ 1215. Agreed with previous assessment. pt has no complaints. vital signs stable. will continue to monitor.

## 2017-09-19 NOTE — Progress Notes (Addendum)
Progress Note  Patient Name: Gina [name] Date of Encounter: 09/19/2017  Primary Cardiologist: CHF Primary Electrophysiologist: SK   Patient Profile     77 y.o. female admitted with shock and AFib with RVR  Has NICM and LBBB She has had long-standing persistent left ventricular dysfunction and catheterization 6/17 demonstrated normal coronary arteries 6/19 echocardiogram EF 20% severe LAE (60) Underwent DCCV has been unable to maintain sinus  Subjective   Somewhat SOB despite good diuresis CVP 11  Inpatient Medications    Scheduled Meds: . Chlorhexidine Gluconate Cloth  6 each Topical Daily  . Chlorhexidine Gluconate Cloth  6 each Topical Daily  . gentamicin irrigation  80 mg Irrigation On Call  . mupirocin ointment  1 application Nasal BID  . potassium chloride  40 mEq Oral Daily  . sodium chloride flush  10-40 mL Intracatheter Q12H  . sodium chloride flush  3 mL Intravenous Q12H  . spironolactone  25 mg Oral Daily   Continuous Infusions: . sodium chloride    . sodium chloride    . amiodarone 60 mg/hr (09/19/17 0740)  . furosemide (LASIX) infusion 15 mg/hr (09/19/17 0740)  . milrinone 0.25 mcg/kg/min (09/19/17 0740)  . potassium chloride 10 mEq (09/19/17 0739)  . vancomycin     PRN Meds: acetaminophen, bisacodyl, ipratropium, MUSCLE RUB, ondansetron (ZOFRAN) IV, sodium chloride, sodium chloride flush, traMADol   Vital Signs    Vitals:   09/19/17 0600 09/19/17 0700 09/19/17 0732 09/19/17 0736  BP: 126/85   111/75  Pulse: (!) 107 (!) 117 (!) 45 (!) 113  Resp: 17 (!) 26 16 (!) 32  Temp:      TempSrc:      SpO2: 95% 92% (!) 89% (!) 89%  Weight:      Height:        Intake/Output Summary (Last 24 hours) at 09/19/2017 0752 Last data filed at 09/19/2017 0740 Gross per 24 hour  Intake 2211.55 ml  Output 5225 ml  Net -3013.45 ml   Filed Weights   09/17/17 0900 09/18/17 0355 09/19/17 0500  Weight: 235 lb 3.7 oz (106.7 kg) 257 lb 4.4 oz (116.7 kg) 252 lb  13.9 oz (114.7 kg)    Telemetry    Afib 100-110 Personally Reviewed  ECG       Physical Exam    Well developed and nourished in mild resp distress HENT normal Neck supple with JVP-11 Carotids brisk and full without bruits Clear anteriorly Irregularly irregular rate and rhythm withrapid ventricular response, no murmurs or gallops Abd-soft with active BS without hepatomegaly No Clubbing cyanosis legs wrapped Skin-warm and dry A & Oriented  Grossly normal sensory and motor function    Labs    Chemistry Recent Labs  Lab 09/17/17 0500 09/18/17 0350 09/19/17 0400  NA 142 141 140  K 4.2 3.6 3.5  CL 106 104 99  CO2 24 26 29   GLUCOSE 169* 134* 123*  BUN 65* 71* 73*  CREATININE 2.10* 2.35* 2.51*  CALCIUM 8.1* 8.3* 8.7*  PROT 5.0* 5.2* 5.2*  ALBUMIN 2.6* 2.8* 2.8*  AST 16 14* 15  ALT 49* 43 35  ALKPHOS 57 61 59  BILITOT 0.8 0.8 0.8  GFRNONAA 22* 19* 18*  GFRAA 25* 22* 20*  ANIONGAP 12 11 12      Hematology Recent Labs  Lab 09/17/17 0830 09/18/17 0350 09/19/17 0400  WBC 4.2 4.1 4.7  RBC 3.62* 3.70* 3.76*  HGB 11.0* 10.9* 11.0*  HCT 36.5 37.2 36.7  MCV 100.8*  100.5* 97.6  MCH 30.4 29.5 29.3  MCHC 30.1 29.3* 30.0  RDW 18.6* 18.0* 17.3*  PLT 216 231 250    Cardiac EnzymesNo results for input(s): TROPONINI in the last 168 hours. No results for input(s): TROPIPOC in the last 168 hours.   BNPNo results for input(s): BNP, PROBNP in the last 168 hours.   DDimer No results for input(s): DDIMER in the last 168 hours.   Radiology    No results found.      Assessment & Plan    CHF acute chronic 3b  Afib RVR  NICM  LBBB  Renal insufficiency gd 4 worsening   OSA CPAP  Milrinone Rx  Stable and situation is quite challenging with worsening renal function and decompensated CHF  Although the latter is better the former is worse  Her opportunity for improvement is probably herein, ie AV ablation and pacing  We will do as much of the procedure as  possible, perhaps putting in just an RV lead ( and CRT can) if LV lead turns out to be very difficult.  Control of HR is the greater goal, as notwithstanding her LBBB she was relatively well compensated till recently and the pacer will simply recapitulate that physiology.   With her worseing renal function will try to avoid and certainly minimize the use of contrast-- this too adds to risk in a situationthat might improve with control of the rapid atrial fibrillation   We can return later for upgrade, realizing that it incurs risk of infection, the overall safety may be improved by keeping the procedure shorter      Signed, Virl Axe, MD  09/19/2017, 7:52 AM

## 2017-09-19 NOTE — Progress Notes (Signed)
Eliquis order ended after this AM's dose. Confirmed with Dr. Neena Rhymes to not reorder d/t procedure tomorrow.

## 2017-09-19 NOTE — Progress Notes (Signed)
[place] notified need for potassium replacement. Awaiting orders.

## 2017-09-19 NOTE — Progress Notes (Signed)
Visited with ms. Gina [name] via [place] for Advanced Directive.  Patient has family members coming this afternoon who will help her fill out the information and I will check back before her procedure to see if we can get it notarized.  She would like to take care of this before she is discharged.    09/19/17 1159  Clinical Encounter Type  Visited With Patient  Visit Type Follow-up;Spiritual support;Pre-op  Spiritual Encounters  Spiritual Needs Prayer;Literature (Advanced Directive education)

## 2017-09-19 NOTE — Progress Notes (Signed)
PT Cancellation Note  Patient Details Name: Gina [name] MRN: [id number] DOB: 07-Jun-1940   Cancelled Treatment:    Reason Eval/Treat Not Completed: Other (comment). Awaiting pacemaker placement today. Will follow-up for PT treatment.  Mabeline Caras, PT, DPT Acute Rehab Services  Pager: [email or web address] 09/19/2017, 11:25 AM

## 2017-09-19 NOTE — Progress Notes (Signed)
Patient ID: Gina [name], female   DOB: 1940/03/22, 77 y.o.   MRN: [id number]     Advanced Heart Failure Rounding Note  PCP-Cardiologist: Carlyle Dolly, MD   Subjective:    Transferred to [facility] 7/10.   S/P DC-CV with 7/11 conversion to NSR. Developed bradycardia so IV amio stopped and po amio started. Around 2200 on 7/11 she went back into A fib RVR.   Repeat DCCV to NSR 7/14.   She went back into atrial fibrillation with RVR 7/16 evening with worsening CHF.    She had to go back on milrinone gtt.  After discussion with Dr. Caryl Comes, [place] a 3rd time on 7/18 to NSR, stayed in NSR most of day but back in afib overnight, rate 110s-120s on amiodarone gtt 60 mg/hr.    Co-ox 64% today, milrinone running at 0.25 with Lasix gtt 15 mg/hr.  She got a dose of metolazone yesterday and diuresed well.  CVP 11-12 this morning. Weights not accurate.  Creatinine up some to 2.5.   Objective:   Weight Range: 257 lb 4.4 oz (116.7 kg) Body mass index is 44.16 kg/m.   Vital Signs:   Temp:  [97.3 F (36.3 C)-98.7 F (37.1 C)] 98.2 F (36.8 C) (07/19 0419) Pulse Rate:  [26-125] 107 (07/19 0600) Resp:  [12-43] 17 (07/19 0600) BP: (110-141)/(58-96) 126/85 (07/19 0600) SpO2:  [93 %-100 %] 95 % (07/19 0600) Last BM Date: 09/17/17  Weight change: Filed Weights   09/17/17 0500 09/17/17 0900 09/18/17 0355  Weight: 208 lb 8.9 oz (94.6 kg) 235 lb 3.7 oz (106.7 kg) 257 lb 4.4 oz (116.7 kg)    Intake/Output:   Intake/Output Summary (Last 24 hours) at 09/19/2017 0656 Last data filed at 09/19/2017 0622 Gross per 24 hour  Intake 2079.59 ml  Output 4825 ml  Net -2745.41 ml      Physical Exam   CVP 11-12 personally checked.  General: NAD Neck: JVP 12 cm, no thyromegaly or thyroid nodule.  Lungs: Clear to auscultation bilaterally with normal respiratory effort. CV: Nondisplaced PMI.  Heart tachy, irregular S1/S2, no S3/S4, 1/6 HSM LLSB.  1+ edema to knees, lower legs wrapped.  Abdomen: Soft,  nontender, no hepatosplenomegaly, no distention.  Skin: Intact without lesions or rashes.  Neurologic: Alert and oriented x 3.  Psych: Normal affect. Extremities: No clubbing or cyanosis.  HEENT: Normal.     Telemetry   Atrial fibrillation 110s-120s, personally reviewed.   EKG    n/a  Labs    CBC Recent Labs    09/18/17 0350 09/19/17 0400  WBC 4.1 4.7  NEUTROABS 3.0 3.5  HGB 10.9* 11.0*  HCT 37.2 36.7  MCV 100.5* 97.6  PLT 231 353   Basic Metabolic Panel Recent Labs    09/18/17 0350 09/19/17 0400  NA 141 140  K 3.6 3.5  CL 104 99  CO2 26 29  GLUCOSE 134* 123*  BUN 71* 73*  CREATININE 2.35* 2.51*  CALCIUM 8.3* 8.7*  MG 2.8* 2.3   Liver Function Tests Recent Labs    09/18/17 0350 09/19/17 0400  AST 14* 15  ALT 43 35  ALKPHOS 61 59  BILITOT 0.8 0.8  PROT 5.2* 5.2*  ALBUMIN 2.8* 2.8*   No results for input(s): LIPASE, AMYLASE in the last 72 hours. Cardiac Enzymes No results for input(s): CKTOTAL, CKMB, CKMBINDEX, TROPONINI in the last 72 hours.  BNP: BNP (last 3 results) Recent Labs    09/02/17 1512  BNP >4,500.0*    ProBNP (  last 3 results) No results for input(s): PROBNP in the last 8760 hours.   D-Dimer No results for input(s): DDIMER in the last 72 hours. Hemoglobin A1C No results for input(s): HGBA1C in the last 72 hours. Fasting Lipid Panel No results for input(s): CHOL, HDL, LDLCALC, TRIG, CHOLHDL, LDLDIRECT in the last 72 hours. Thyroid Function Tests No results for input(s): TSH, T4TOTAL, T3FREE, THYROIDAB in the last 72 hours.  Invalid input(s): FREET3  Other results:   Imaging    No results found.   Medications:     Scheduled Medications: . chlorhexidine  60 mL Topical Once  . Chlorhexidine Gluconate Cloth  6 each Topical Daily  . Chlorhexidine Gluconate Cloth  6 each Topical Daily  . gentamicin irrigation  80 mg Irrigation On Call  . mupirocin ointment  1 application Nasal BID  . potassium chloride  40 mEq  Oral Daily  . sodium chloride flush  10-40 mL Intracatheter Q12H  . sodium chloride flush  3 mL Intravenous Q12H  . spironolactone  25 mg Oral Daily    Infusions: . sodium chloride    . sodium chloride    . amiodarone 60 mg/hr (09/19/17 0622)  . furosemide (LASIX) infusion 15 mg/hr (09/19/17 0600)  . milrinone 0.25 mcg/kg/min (09/19/17 0600)  . potassium chloride 10 mEq (09/19/17 0615)  . vancomycin      PRN Medications: acetaminophen, bisacodyl, ipratropium, MUSCLE RUB, ondansetron (ZOFRAN) IV, sodium chloride, sodium chloride flush, traMADol    Patient Profile   Gina [name] is a 77 year old with a history chronic systolic heart failure, anemia, htn, LBBB, renal artery stenosis, and OSA.   Transferred to [facility] on 7/10 with cardiogenic shock, Afib RVR, and volume overload.  Assessment/Plan   1. Acute on chronic systolic CHF: Echo (7/12) with EF 20%. Long-standing cardiomyopathy. Nonischemic based on cath in 2017. She had low output HF with co-ox 41%, CVP 17, AKI on CKD stage 3 ("cold and wet"). Exacerbation triggered by atrial fibrillation and also by being off Lasix for a number of days after hospitalization at [facility] for UTI.  She went back into atrial fibrillation with RVR 7/16 evening with worsening of CHF, CXR with pulmonary edema.  Think worsening triggered by going back into AF. DCCV again 7/18 but back in afib this morning.  She is now back on milrinone 0.25 and Lasix gtt 15.  She diuresed yesterday well with dose of metolazone, CVP 11-12 this morning with co-ox 64%.  - Continue Lasix gtt at 15 mg/hr but with rise in creatinine will hold off on metolazone.  Hopefully, will have better cardiac output with AV nodal ablation/BiV pacing and can diurese aggressively again without creatinine rise.  - With rise in creatinine, stopped digoxin.  - Continue milrinone 0.25.  - Continue spironolactone to 25 mg daily.   - We have had a hard time keeping her in NSR, she clearly  decompensates in afib with RVR.  Rate difficult to control in afib.  Has wide LBBB, ultimately needs CRT.  Dr. Caryl Comes saw her, agrees that AV nodal ablation + CRT would be the best option.  Planned for this afternoon.  2. AKI on CKD stage 3: Suspect cardiorenal syndrome with low output HF. No NSAID use.  Creatinine up from 2.1=>2.3=>2.5 today.  No metolazone today.  3. Atrial fibrillation: Admitted with afib/RVR, had been in afib for about 6 wks. TSH normal.  Had been on Eliquis for about 4 weeks without missing any doses. Cardioverted to NSR 7/11  but back in atrial fibrillation with RVR by 7/12. Repeat DCCV to NSR in 7/14 after additional amiodarone loading but back in atrial fibrillation with RVR evening of 7/16.  DCCV again 7/18 but back in afib by am 7/19.  She clearly worsens when she goes back into afib with RVR.  - She is back on IV amiodarone now at 60 mg/hr and continues Eliquis.  - As above, think she needs AV nodal ablation with BiV pacing.  This is planned for today.  Breathing is stable. 4. Elevated LFTs: Suspect shock liver with low cardiac output. LFTs trended down.  5. ID: E coli UTI. She completed ceftriaxone.  6. Deconditioning: PT/OT consults. Out of bed. Will need SNF at discharge.    CRITICAL CARE Performed by: [name]  Total critical care time: 35 minutes  Critical care time was exclusive of separately billable procedures and treating other patients.  Critical care was necessary to treat or prevent imminent or life-threatening deterioration.  Critical care was time spent personally by me on the following activities: development of treatment plan with patient and/or surrogate as well as nursing, discussions with consultants, evaluation of patient's response to treatment, examination of patient, obtaining history from patient or surrogate, ordering and performing treatments and interventions, ordering and review of laboratory studies, ordering and review of radiographic  studies, pulse oximetry and re-evaluation of patient's condition.  [name] 09/19/2017 6:56 AM

## 2017-09-20 ENCOUNTER — Inpatient Hospital Stay (HOSPITAL_COMMUNITY): Payer: Medicare Other

## 2017-09-20 LAB — CBC WITH DIFFERENTIAL/PLATELET
Abs Immature Granulocytes: 0 10*3/uL (ref 0.0–0.1)
Basophils Absolute: 0 10*3/uL (ref 0.0–0.1)
Basophils Relative: 0 %
Eosinophils Absolute: 0.2 10*3/uL (ref 0.0–0.7)
Eosinophils Relative: 4 %
HCT: 37.4 % (ref 36.0–46.0)
Hemoglobin: 11.2 g/dL — ABNORMAL LOW (ref 12.0–15.0)
Immature Granulocytes: 0 %
Lymphocytes Relative: 10 %
Lymphs Abs: 0.5 10*3/uL — ABNORMAL LOW (ref 0.7–4.0)
MCH: 29.1 pg (ref 26.0–34.0)
MCHC: 29.9 g/dL — ABNORMAL LOW (ref 30.0–36.0)
MCV: 97.1 fL (ref 78.0–100.0)
Monocytes Absolute: 0.6 10*3/uL (ref 0.1–1.0)
Monocytes Relative: 12 %
Neutro Abs: 3.4 10*3/uL (ref 1.7–7.7)
Neutrophils Relative %: 74 %
Platelets: 283 10*3/uL (ref 150–400)
RBC: 3.85 MIL/uL — ABNORMAL LOW (ref 3.87–5.11)
RDW: 17.4 % — ABNORMAL HIGH (ref 11.5–15.5)
WBC: 4.6 10*3/uL (ref 4.0–10.5)

## 2017-09-20 LAB — COMPREHENSIVE METABOLIC PANEL
ALT: 27 U/L (ref 0–44)
AST: 14 U/L — ABNORMAL LOW (ref 15–41)
Albumin: 2.7 g/dL — ABNORMAL LOW (ref 3.5–5.0)
Alkaline Phosphatase: 56 U/L (ref 38–126)
Anion gap: 10 (ref 5–15)
BUN: 68 mg/dL — ABNORMAL HIGH (ref 8–23)
CO2: 31 mmol/L (ref 22–32)
Calcium: 8.6 mg/dL — ABNORMAL LOW (ref 8.9–10.3)
Chloride: 98 mmol/L (ref 98–111)
Creatinine, Ser: 2.45 mg/dL — ABNORMAL HIGH (ref 0.44–1.00)
GFR calc Af Amer: 21 mL/min — ABNORMAL LOW (ref >60)
GFR calc non Af Amer: 18 mL/min — ABNORMAL LOW (ref >60)
Glucose, Bld: 115 mg/dL — ABNORMAL HIGH (ref 70–99)
Potassium: 3.6 mmol/L (ref 3.5–5.1)
Sodium: 139 mmol/L (ref 135–145)
Total Bilirubin: 0.9 mg/dL (ref 0.3–1.2)
Total Protein: 5 g/dL — ABNORMAL LOW (ref 6.5–8.1)

## 2017-09-20 LAB — CULTURE, RESPIRATORY W GRAM STAIN: Culture: NORMAL

## 2017-09-20 LAB — COOXEMETRY PANEL
Carboxyhemoglobin: 2.1 % — ABNORMAL HIGH (ref 0.5–1.5)
Methemoglobin: 0.8 % (ref 0.0–1.5)
O2 Saturation: 75.1 %
Total hemoglobin: 11.6 g/dL — ABNORMAL LOW (ref 12.0–16.0)

## 2017-09-20 MED ORDER — TORSEMIDE 20 MG PO TABS
60.0000 mg | ORAL_TABLET | Freq: Every day | ORAL | Status: DC
  Administered 2017-09-20 – 2017-09-21 (×2): 60 mg via ORAL
  Filled 2017-09-20 (×2): qty 3

## 2017-09-20 NOTE — Plan of Care (Signed)
  Problem: Activity: Goal: Risk for activity intolerance will decrease Outcome: Progressing   Problem: Elimination: Goal: Will not experience complications related to urinary retention Outcome: Progressing   Problem: Safety: Goal: Ability to remain free from injury will improve Outcome: Progressing   Problem: Skin Integrity: Goal: Risk for impaired skin integrity will decrease Outcome: Progressing   Problem: Activity: Goal: Capacity to carry out activities will improve Outcome: Progressing

## 2017-09-20 NOTE — Progress Notes (Signed)
Progress Note  Patient Name: Gina [name] Date of Encounter: 09/20/2017  Primary Cardiologist: Carlyle Dolly, MD   Subjective   Minimal incisional discomfort.  No hematoma area minimal ecchymoses  Inpatient Medications    Scheduled Meds: . Chlorhexidine Gluconate Cloth  6 each Topical Daily  . Chlorhexidine Gluconate Cloth  6 each Topical Daily  . enoxaparin (LOVENOX) injection  30 mg Subcutaneous Q24H  . mupirocin ointment  1 application Nasal BID  . potassium chloride  40 mEq Oral Daily  . sodium chloride flush  10-40 mL Intracatheter Q12H  . sodium chloride flush  3 mL Intravenous Q12H  . spironolactone  25 mg Oral Daily  . torsemide  60 mg Oral Daily   Continuous Infusions: . sodium chloride    . milrinone 0.125 mcg/kg/min (09/20/17 0900)  . [START ON 09/21/2017] vancomycin     PRN Meds: sodium chloride, acetaminophen, bisacodyl, ipratropium, MUSCLE RUB, ondansetron (ZOFRAN) IV, sodium chloride, sodium chloride flush, traMADol   Vital Signs    Vitals:   09/20/17 0700 09/20/17 0800 09/20/17 0809 09/20/17 0900  BP: 122/74 121/74  124/71  Pulse: 90 90  90  Resp: 16 19  20   Temp:   98.7 F (37.1 C)   TempSrc:   Oral   SpO2: 96% 94%  95%  Weight:      Height:        Intake/Output Summary (Last 24 hours) at 09/20/2017 1004 Last data filed at 09/20/2017 0900 Gross per 24 hour  Intake 692.7 ml  Output 4145 ml  Net -3452.3 ml   Filed Weights   09/18/17 0355 09/19/17 0500 09/20/17 0500  Weight: 257 lb 4.4 oz (116.7 kg) 252 lb 13.9 oz (114.7 kg) 247 lb 9.2 oz (112.3 kg)    Telemetry    Atrial fibrillation with ventricular pacing at 90 bpm- Personally Reviewed  ECG    None- Personally Reviewed  Physical Exam   GEN: No acute distress.   Neck:  7 cm JVD Cardiac: RRR, no murmurs, rubs, or gallops.  Respiratory: Clear to auscultation bilaterally.  Incision demonstrates a small amount of blood on the bandage. GI: Soft, nontender, non-distended  MS: No  edema; No deformity. Neuro:  Nonfocal  Psych: Normal affect   Labs    Chemistry Recent Labs  Lab 09/18/17 0350 09/19/17 0400 09/20/17 0440  NA 141 140 139  K 3.6 3.5 3.6  CL 104 99 98  CO2 26 29 31   GLUCOSE 134* 123* 115*  BUN 71* 73* 68*  CREATININE 2.35* 2.51* 2.45*  CALCIUM 8.3* 8.7* 8.6*  PROT 5.2* 5.2* 5.0*  ALBUMIN 2.8* 2.8* 2.7*  AST 14* 15 14*  ALT 43 35 27  ALKPHOS 61 59 56  BILITOT 0.8 0.8 0.9  GFRNONAA 19* 18* 18*  GFRAA 22* 20* 21*  ANIONGAP 11 12 10      Hematology Recent Labs  Lab 09/18/17 0350 09/19/17 0400 09/20/17 0440  WBC 4.1 4.7 4.6  RBC 3.70* 3.76* 3.85*  HGB 10.9* 11.0* 11.2*  HCT 37.2 36.7 37.4  MCV 100.5* 97.6 97.1  MCH 29.5 29.3 29.1  MCHC 29.3* 30.0 29.9*  RDW 18.0* 17.3* 17.4*  PLT 231 250 283    Cardiac EnzymesNo results for input(s): TROPONINI in the last 168 hours. No results for input(s): TROPIPOC in the last 168 hours.   BNPNo results for input(s): BNP, PROBNP in the last 168 hours.   DDimer No results for input(s): DDIMER in the last 168 hours.   Radiology  Dg Chest Port 1 View  Result Date: 09/20/2017 CLINICAL DATA:  Pacemaker placement. EXAM: PORTABLE CHEST 1 VIEW COMPARISON:  Chest x-ray dated September 17, 2017. FINDINGS: Interval placement of a left chest wall pacemaker with leads terminating in the right atrium and right ventricle. Unchanged tunneled right internal jugular central venous catheter with tip at the cavoatrial junction. Stable moderate cardiomegaly with pulmonary vascular congestion and unchanged bilateral hazy lower lung opacities likely reflecting a combination of pleural fluid and atelectasis. No pneumothorax. No acute osseous abnormality. IMPRESSION: 1. Interval placement of a left chest wall pacemaker. No pneumothorax. 2. Unchanged pulmonary vascular congestion and small bilateral pleural effusions. Electronically Signed   By: Titus Dubin M.D.   On: 09/20/2017 09:22    Cardiac Studies    None  Patient Profile     77 y.o. female admitted with heart failure and uncontrolled atrial fibrillation status post AV node ablation and pacemaker.  Assessment & Plan    1.  Pacemaker -she is status post dual-chamber pacemaker.  Unable to place LV lead. 2.  Atrial fibrillation -she is status post AV node ablation.  She is pacing in the ventricle at 90 bpm. 3.  Chronic systolic heart failure -she is followed by the heart failure service.  See the recommendations  For questions or updates, please contact [facility] Please consult [email or web address] for contact info under Cardiology/STEMI.      Signed, Cristopher Peru, MD  09/20/2017, 10:04 AM  Patient ID: Gina [name], female   DOB: 01/16/1941, 77 y.o.   MRN: [id number]

## 2017-09-20 NOTE — Progress Notes (Signed)
RT at the bedside to assist pt with her home CPAP. RT plugged pt's CPAP and added water to the chamber.  2L of 02 bled which is what pt bleeds at home.

## 2017-09-20 NOTE — Progress Notes (Signed)
Patient ID: Gina [name], female   DOB: Mar 11, 1940, 77 y.o.   MRN: [id number]     Advanced Heart Failure Rounding Note  PCP-Cardiologist: Carlyle Dolly, MD   Subjective:    Transferred to [facility] 7/10.   S/P DC-CV with 7/11 conversion to NSR. Developed bradycardia so IV amio stopped and po amio started. Around 2200 on 7/11 she went back into A fib RVR.   Repeat DCCV to NSR 7/14.   She went back into atrial fibrillation with RVR 7/16 evening with worsening CHF.    She had to go back on milrinone gtt.  After discussion with Dr. Caryl Comes, Gina [name] a 3rd time on 7/18 to NSR, stayed in NSR most of day but back in afib overnight.    On 7/19, she had AV nodal ablation and PPM implantation.  Dr. Caryl Comes was unable to place an LV lead.   Since ablation/pacing, BP is higher.  She diuresed very well yesterday, CVP down to 5-6 this morning.  Co-ox 75% on milrinone 0.25 + Lasix gtt 15. Creatinine fairly stable at 2.45.   Objective:   Weight Range: 247 lb 9.2 oz (112.3 kg) Body mass index is 42.5 kg/m.   Vital Signs:   Temp:  [97.7 F (36.5 C)-98.4 F (36.9 C)] 97.7 F (36.5 C) (07/20 0351) Pulse Rate:  [58-189] 90 (07/20 0700) Resp:  [0-67] 16 (07/20 0700) BP: (79-138)/(45-98) 122/74 (07/20 0700) SpO2:  [91 %-100 %] 96 % (07/20 0700) Weight:  [247 lb 9.2 oz (112.3 kg)] 247 lb 9.2 oz (112.3 kg) (07/20 0500) Last BM Date: 09/17/17  Weight change: Filed Weights   09/18/17 0355 09/19/17 0500 09/20/17 0500  Weight: 257 lb 4.4 oz (116.7 kg) 252 lb 13.9 oz (114.7 kg) 247 lb 9.2 oz (112.3 kg)    Intake/Output:   Intake/Output Summary (Last 24 hours) at 09/20/2017 0741 Last data filed at 09/20/2017 0700 Gross per 24 hour  Intake 736.85 ml  Output 4420 ml  Net -3683.15 ml      Physical Exam   CVP 5-6 personally checked.  General: NAD Neck: No JVD, no thyromegaly or thyroid nodule.  Lungs: Clear to auscultation bilaterally with normal respiratory effort. CV: Nondisplaced PMI.  Heart  regular S1/S2, no S3/S4, no murmur.  Legs wrapped.  Abdomen: Soft, nontender, no hepatosplenomegaly, no distention.  Skin: Intact without lesions or rashes.  Neurologic: Alert and oriented x 3.  Psych: Normal affect. Extremities: No clubbing or cyanosis.  HEENT: Normal.     Telemetry   Afib, V-paced 90s (personally reviewed)  EKG    n/a  Labs    CBC Recent Labs    09/19/17 0400 09/20/17 0440  WBC 4.7 4.6  NEUTROABS 3.5 3.4  HGB 11.0* 11.2*  HCT 36.7 37.4  MCV 97.6 97.1  PLT 250 967   Basic Metabolic Panel Recent Labs    09/18/17 0350 09/19/17 0400 09/20/17 0440  NA 141 140 139  K 3.6 3.5 3.6  CL 104 99 98  CO2 26 29 31   GLUCOSE 134* 123* 115*  BUN 71* 73* 68*  CREATININE 2.35* 2.51* 2.45*  CALCIUM 8.3* 8.7* 8.6*  MG 2.8* 2.3  --    Liver Function Tests Recent Labs    09/19/17 0400 09/20/17 0440  AST 15 14*  ALT 35 27  ALKPHOS 59 56  BILITOT 0.8 0.9  PROT 5.2* 5.0*  ALBUMIN 2.8* 2.7*   No results for input(s): LIPASE, AMYLASE in the last 72 hours. Cardiac Enzymes No results for  input(s): CKTOTAL, CKMB, CKMBINDEX, TROPONINI in the last 72 hours.  BNP: BNP (last 3 results) Recent Labs    09/02/17 1512  BNP >4,500.0*    ProBNP (last 3 results) No results for input(s): PROBNP in the last 8760 hours.   D-Dimer No results for input(s): DDIMER in the last 72 hours. Hemoglobin A1C No results for input(s): HGBA1C in the last 72 hours. Fasting Lipid Panel No results for input(s): CHOL, HDL, LDLCALC, TRIG, CHOLHDL, LDLDIRECT in the last 72 hours. Thyroid Function Tests No results for input(s): TSH, T4TOTAL, T3FREE, THYROIDAB in the last 72 hours.  Invalid input(s): FREET3  Other results:   Imaging    No results found.   Medications:     Scheduled Medications: . Chlorhexidine Gluconate Cloth  6 each Topical Daily  . Chlorhexidine Gluconate Cloth  6 each Topical Daily  . enoxaparin (LOVENOX) injection  30 mg Subcutaneous Q24H  .  mupirocin ointment  1 application Nasal BID  . potassium chloride  40 mEq Oral Daily  . sodium chloride flush  10-40 mL Intracatheter Q12H  . sodium chloride flush  3 mL Intravenous Q12H  . spironolactone  25 mg Oral Daily  . torsemide  60 mg Oral Daily    Infusions: . sodium chloride    . milrinone 0.25 mcg/kg/min (09/20/17 0427)  . [START ON 09/21/2017] vancomycin      PRN Medications: sodium chloride, acetaminophen, bisacodyl, ipratropium, MUSCLE RUB, ondansetron (ZOFRAN) IV, sodium chloride, sodium chloride flush, traMADol    Patient Profile   Gina [name] is a 77 year old with a history chronic systolic heart failure, anemia, htn, LBBB, renal artery stenosis, and OSA.   Transferred to [facility] on 7/10 with cardiogenic shock, Afib RVR, and volume overload.  Assessment/Plan   1. Acute on chronic systolic CHF: Echo (3/84) with EF 20%. Long-standing cardiomyopathy. Nonischemic based on cath in 2017. She had low output HF with co-ox 41%, CVP 17, AKI on CKD stage 3 ("cold and wet"). Exacerbation triggered by atrial fibrillation and also by being off Lasix for a number of days after hospitalization at [facility] for UTI.  She went back into atrial fibrillation with RVR 7/16 evening with worsening of CHF, CXR with pulmonary edema.  Think worsening triggered by going back into AF. DCCV again 7/18 but back in afib the next morning.  7/19, she had AV nodal ablation with PPM but unable to place LV lead.  She is on milrinone 0.25 and Lasix gtt 15.  She diuresed yesterday quite well, CVP 5-6 with co-ox 75% this morning.   - Stop IV Lasix today, will start torsemide 60 mg daily.  - Decrease milrinone to 0.125.  - If creatinine comes down, will restart digoxin.   - Continue spironolactone 25 mg daily.   - Will discuss with Dr. Caryl Comes re-attempting LV lead in the future.  2. AKI on CKD stage 3: Suspect cardiorenal syndrome with low output HF. No NSAID use.  Creatinine stable today .  3. Atrial  fibrillation: Admitted with afib/RVR, had been in afib for about 6 wks. TSH normal.  Had been on Eliquis for about 4 weeks without missing any doses. Cardioverted to NSR 7/11 but back in atrial fibrillation with RVR by 7/12. Repeat DCCV to NSR in 7/14 after additional amiodarone loading but back in atrial fibrillation with RVR evening of 7/16.  DCCV again 7/18 but back in afib by am 7/19.  She clearly worsens when she goes back into afib with RVR.  She  is now s/p AV nodal ablation with PPM.  - She is off amiodarone now.  - Restart Eliquis when ok with EP.  4. Elevated LFTs: Suspect shock liver with low cardiac output. LFTs trended down.  5. ID: E coli UTI. She completed ceftriaxone.  6. Deconditioning: PT/OT consults. Out of bed. Will need SNF at discharge.    [name] 09/20/2017 7:41 AM

## 2017-09-21 LAB — COMPREHENSIVE METABOLIC PANEL
ALT: 21 U/L (ref 0–44)
AST: 11 U/L — ABNORMAL LOW (ref 15–41)
Albumin: 2.5 g/dL — ABNORMAL LOW (ref 3.5–5.0)
Alkaline Phosphatase: 51 U/L (ref 38–126)
Anion gap: 10 (ref 5–15)
BUN: 65 mg/dL — ABNORMAL HIGH (ref 8–23)
CO2: 32 mmol/L (ref 22–32)
Calcium: 8.6 mg/dL — ABNORMAL LOW (ref 8.9–10.3)
Chloride: 99 mmol/L (ref 98–111)
Creatinine, Ser: 2.38 mg/dL — ABNORMAL HIGH (ref 0.44–1.00)
GFR calc Af Amer: 22 mL/min — ABNORMAL LOW (ref >60)
GFR calc non Af Amer: 19 mL/min — ABNORMAL LOW (ref >60)
Glucose, Bld: 109 mg/dL — ABNORMAL HIGH (ref 70–99)
Potassium: 3.4 mmol/L — ABNORMAL LOW (ref 3.5–5.1)
Sodium: 141 mmol/L (ref 135–145)
Total Bilirubin: 0.8 mg/dL (ref 0.3–1.2)
Total Protein: 4.8 g/dL — ABNORMAL LOW (ref 6.5–8.1)

## 2017-09-21 LAB — CBC WITH DIFFERENTIAL/PLATELET
Abs Immature Granulocytes: 0 10*3/uL (ref 0.0–0.1)
Basophils Absolute: 0 10*3/uL (ref 0.0–0.1)
Basophils Relative: 1 %
Eosinophils Absolute: 0.1 10*3/uL (ref 0.0–0.7)
Eosinophils Relative: 4 %
HCT: 36.1 % (ref 36.0–46.0)
Hemoglobin: 11 g/dL — ABNORMAL LOW (ref 12.0–15.0)
Immature Granulocytes: 0 %
Lymphocytes Relative: 16 %
Lymphs Abs: 0.6 10*3/uL — ABNORMAL LOW (ref 0.7–4.0)
MCH: 29.4 pg (ref 26.0–34.0)
MCHC: 30.5 g/dL (ref 30.0–36.0)
MCV: 96.5 fL (ref 78.0–100.0)
Monocytes Absolute: 0.6 10*3/uL (ref 0.1–1.0)
Monocytes Relative: 15 %
Neutro Abs: 2.6 10*3/uL (ref 1.7–7.7)
Neutrophils Relative %: 64 %
Platelets: 257 10*3/uL (ref 150–400)
RBC: 3.74 MIL/uL — ABNORMAL LOW (ref 3.87–5.11)
RDW: 17.6 % — ABNORMAL HIGH (ref 11.5–15.5)
WBC: 4 10*3/uL (ref 4.0–10.5)

## 2017-09-21 LAB — COOXEMETRY PANEL
Carboxyhemoglobin: 1.7 % — ABNORMAL HIGH (ref 0.5–1.5)
Methemoglobin: 1.5 % (ref 0.0–1.5)
O2 Saturation: 66.5 %
Total hemoglobin: 11.1 g/dL — ABNORMAL LOW (ref 12.0–16.0)

## 2017-09-21 MED ORDER — DIGOXIN 125 MCG PO TABS
0.0625 mg | ORAL_TABLET | Freq: Every day | ORAL | Status: DC
  Administered 2017-09-21 – 2017-09-23 (×3): 0.0625 mg via ORAL
  Filled 2017-09-21 (×3): qty 1

## 2017-09-21 MED ORDER — POTASSIUM CHLORIDE CRYS ER 20 MEQ PO TBCR
20.0000 meq | EXTENDED_RELEASE_TABLET | Freq: Once | ORAL | Status: AC
  Administered 2017-09-21: 20 meq via ORAL
  Filled 2017-09-21: qty 1

## 2017-09-21 NOTE — Progress Notes (Signed)
Patient was placed on home  CPAP by RN and the patient is tolerating and resting at this time.

## 2017-09-21 NOTE — Progress Notes (Signed)
Patient ID: Gina [name], female   DOB: [birth year], 77 y.o.   MRN: [id number]     Advanced Heart Failure Rounding Note  PCP-Cardiologist: Carlyle Dolly, MD   Subjective:    Transferred to [facility] 7/10.   S/P DC-CV with 7/11 conversion to NSR. Developed bradycardia so IV amio stopped and po amio started. Around 2200 on 7/11 she went back into A fib RVR.   Repeat DCCV to NSR 7/14.   She went back into atrial fibrillation with RVR 7/16 evening with worsening CHF.    She had to go back on milrinone gtt.  After discussion with Dr. Caryl Comes, [name] a 3rd time on 7/18 to NSR, stayed in NSR most of day but back in afib overnight.    On 7/19, she had AV nodal ablation and PPM implantation.  Dr. Caryl Comes was unable to place an LV lead.   Since ablation/pacing, BP is higher.  She diuresed very well again yesterday even after stopping IV Lasix, CVP 9-10 this morning.  Co-ox 66% on milrinone 0.125. Creatinine lower at 2.38.   Objective:   Weight Range: 236 lb 15.9 oz (107.5 kg) Body mass index is 40.68 kg/m.   Vital Signs:   Temp:  [98.2 F (36.8 C)-99 F (37.2 C)] 98.5 F (36.9 C) (07/21 0310) Pulse Rate:  [89-92] 90 (07/21 0600) Resp:  [11-34] 16 (07/21 0600) BP: (105-143)/(67-83) 129/77 (07/21 0600) SpO2:  [93 %-100 %] 99 % (07/21 0600) Weight:  [236 lb 15.9 oz (107.5 kg)] 236 lb 15.9 oz (107.5 kg) (07/21 0600) Last BM Date: 09/17/17  Weight change: Filed Weights   09/19/17 0500 09/20/17 0500 09/21/17 0600  Weight: 252 lb 13.9 oz (114.7 kg) 247 lb 9.2 oz (112.3 kg) 236 lb 15.9 oz (107.5 kg)    Intake/Output:   Intake/Output Summary (Last 24 hours) at 09/21/2017 0731 Last data filed at 09/21/2017 0600 Gross per 24 hour  Intake 1318.1 ml  Output 4600 ml  Net -3281.9 ml      Physical Exam   CVP 9-10 personally checked.  General: NAD Neck: JVP 8 cm, no thyromegaly or thyroid nodule.  Lungs: Clear to auscultation bilaterally with normal respiratory effort. CV: Nondisplaced PMI.   Heart regular S1/S2, no S3/S4, no murmur.  Legs wrapped.   Abdomen: Soft, nontender, no hepatosplenomegaly, no distention.  Skin: Intact without lesions or rashes.  Neurologic: Alert and oriented x 3.  Psych: Normal affect. Extremities: No clubbing or cyanosis.  HEENT: Normal.    Telemetry   Afib, V-paced 90 (personally reviewed)  EKG    n/a  Labs    CBC Recent Labs    09/20/17 0440 09/21/17 0352  WBC 4.6 4.0  NEUTROABS 3.4 2.6  HGB 11.2* 11.0*  HCT 37.4 36.1  MCV 97.1 96.5  PLT 283 409   Basic Metabolic Panel Recent Labs    09/19/17 0400 09/20/17 0440 09/21/17 0352  NA 140 139 141  K 3.5 3.6 3.4*  CL 99 98 99  CO2 29 31 32  GLUCOSE 123* 115* 109*  BUN 73* 68* 65*  CREATININE 2.51* 2.45* 2.38*  CALCIUM 8.7* 8.6* 8.6*  MG 2.3  --   --    Liver Function Tests Recent Labs    09/20/17 0440 09/21/17 0352  AST 14* 11*  ALT 27 21  ALKPHOS 56 51  BILITOT 0.9 0.8  PROT 5.0* 4.8*  ALBUMIN 2.7* 2.5*   No results for input(s): LIPASE, AMYLASE in the last 72 hours. Cardiac Enzymes No  results for input(s): CKTOTAL, CKMB, CKMBINDEX, TROPONINI in the last 72 hours.  BNP: BNP (last 3 results) Recent Labs    09/02/17 1512  BNP >4,500.0*    ProBNP (last 3 results) No results for input(s): PROBNP in the last 8760 hours.   D-Dimer No results for input(s): DDIMER in the last 72 hours. Hemoglobin A1C No results for input(s): HGBA1C in the last 72 hours. Fasting Lipid Panel No results for input(s): CHOL, HDL, LDLCALC, TRIG, CHOLHDL, LDLDIRECT in the last 72 hours. Thyroid Function Tests No results for input(s): TSH, T4TOTAL, T3FREE, THYROIDAB in the last 72 hours.  Invalid input(s): FREET3  Other results:   Imaging    No results found.   Medications:     Scheduled Medications: . Chlorhexidine Gluconate Cloth  6 each Topical Daily  . Chlorhexidine Gluconate Cloth  6 each Topical Daily  . digoxin  0.0625 mg Oral Daily  . enoxaparin (LOVENOX)  injection  30 mg Subcutaneous Q24H  . mupirocin ointment  1 application Nasal BID  . potassium chloride  20 mEq Oral Once  . potassium chloride  40 mEq Oral Daily  . sodium chloride flush  10-40 mL Intracatheter Q12H  . sodium chloride flush  3 mL Intravenous Q12H  . spironolactone  25 mg Oral Daily  . torsemide  60 mg Oral Daily    Infusions: . sodium chloride 10 mL/hr at 09/21/17 0600  . vancomycin      PRN Medications: sodium chloride, acetaminophen, bisacodyl, ipratropium, MUSCLE RUB, ondansetron (ZOFRAN) IV, sodium chloride, sodium chloride flush, traMADol    Patient Profile   Gina [name] is a 77 year old with a history chronic systolic heart failure, anemia, htn, LBBB, renal artery stenosis, and OSA.   Transferred to [facility] on 7/10 with cardiogenic shock, Afib RVR, and volume overload.  Assessment/Plan   1. Acute on chronic systolic CHF: Echo (2/67) with EF 20%. Long-standing cardiomyopathy. Nonischemic based on cath in 2017. She had low output HF with co-ox 41%, CVP 17, AKI on CKD stage 3 ("cold and wet"). Exacerbation triggered by atrial fibrillation and also by being off Lasix for a number of days after hospitalization at [facility] for UTI.  She went back into atrial fibrillation with RVR 7/16 evening with worsening of CHF, CXR with pulmonary edema.  Think worsening triggered by going back into AF. DCCV again 7/18 but back in afib the next morning.  7/19, she had AV nodal ablation with PPM but unable to place LV lead.  She is on milrinone 0.125, we stopped Lasix gtt yesterday.  She diuresed yesterday quite well again, CVP 9-10 with co-ox 66% this morning.   - Stop milrinone today.  - Continue torsemide 60 mg daily . - Restart low dose digoxin 0.0625 daily.   - Continue spironolactone 25 mg daily.   - Will discuss with Dr. Caryl Comes re-attempting LV lead in the future.  2. AKI on CKD stage 3: Suspect cardiorenal syndrome with low output HF. No NSAID use.  Creatinine stable  today .  3. Atrial fibrillation: Admitted with afib/RVR, had been in afib for about 6 wks. TSH normal.  Had been on Eliquis for about 4 weeks without missing any doses. Cardioverted to NSR 7/11 but back in atrial fibrillation with RVR by 7/12. Repeat DCCV to NSR in 7/14 after additional amiodarone loading but back in atrial fibrillation with RVR evening of 7/16.  DCCV again 7/18 but back in afib by am 7/19.  She clearly worsens when she goes  back into afib with RVR.  She is now s/p AV nodal ablation with PPM.  - She is off amiodarone now.  - Restart Eliquis Monday if PPM site stable per EP.  4. Elevated LFTs: Suspect shock liver with low cardiac output. LFTs trended down.  5. ID: E coli UTI. She completed ceftriaxone.  6. Deconditioning: PT/OT consults. Out of bed. Will need SNF at discharge => possibly Tuesday.    [name] 09/21/2017 7:31 AM

## 2017-09-22 ENCOUNTER — Encounter (HOSPITAL_COMMUNITY): Payer: Self-pay | Admitting: Internal Medicine

## 2017-09-22 ENCOUNTER — Ambulatory Visit: Payer: Medicare Other | Admitting: Physician Assistant

## 2017-09-22 DIAGNOSIS — Z959 Presence of cardiac and vascular implant and graft, unspecified: Secondary | ICD-10-CM

## 2017-09-22 DIAGNOSIS — Z950 Presence of cardiac pacemaker: Secondary | ICD-10-CM

## 2017-09-22 LAB — COMPREHENSIVE METABOLIC PANEL
ALT: 18 U/L (ref 0–44)
AST: 11 U/L — ABNORMAL LOW (ref 15–41)
Albumin: 2.7 g/dL — ABNORMAL LOW (ref 3.5–5.0)
Alkaline Phosphatase: 55 U/L (ref 38–126)
Anion gap: 12 (ref 5–15)
BUN: 68 mg/dL — ABNORMAL HIGH (ref 8–23)
CO2: 33 mmol/L — ABNORMAL HIGH (ref 22–32)
Calcium: 8.8 mg/dL — ABNORMAL LOW (ref 8.9–10.3)
Chloride: 95 mmol/L — ABNORMAL LOW (ref 98–111)
Creatinine, Ser: 2.44 mg/dL — ABNORMAL HIGH (ref 0.44–1.00)
GFR calc Af Amer: 21 mL/min — ABNORMAL LOW (ref >60)
GFR calc non Af Amer: 18 mL/min — ABNORMAL LOW (ref >60)
Glucose, Bld: 100 mg/dL — ABNORMAL HIGH (ref 70–99)
Potassium: 3.8 mmol/L (ref 3.5–5.1)
Sodium: 140 mmol/L (ref 135–145)
Total Bilirubin: 1 mg/dL (ref 0.3–1.2)
Total Protein: 5.4 g/dL — ABNORMAL LOW (ref 6.5–8.1)

## 2017-09-22 LAB — CBC WITH DIFFERENTIAL/PLATELET
Abs Immature Granulocytes: 0 10*3/uL (ref 0.0–0.1)
Basophils Absolute: 0.1 10*3/uL (ref 0.0–0.1)
Basophils Relative: 1 %
Eosinophils Absolute: 0.1 10*3/uL (ref 0.0–0.7)
Eosinophils Relative: 3 %
HCT: 38.1 % (ref 36.0–46.0)
Hemoglobin: 11.4 g/dL — ABNORMAL LOW (ref 12.0–15.0)
Immature Granulocytes: 0 %
Lymphocytes Relative: 16 %
Lymphs Abs: 0.8 10*3/uL (ref 0.7–4.0)
MCH: 29.4 pg (ref 26.0–34.0)
MCHC: 29.9 g/dL — ABNORMAL LOW (ref 30.0–36.0)
MCV: 98.2 fL (ref 78.0–100.0)
Monocytes Absolute: 0.7 10*3/uL (ref 0.1–1.0)
Monocytes Relative: 14 %
Neutro Abs: 3.1 10*3/uL (ref 1.7–7.7)
Neutrophils Relative %: 66 %
Platelets: 258 10*3/uL (ref 150–400)
RBC: 3.88 MIL/uL (ref 3.87–5.11)
RDW: 17.7 % — ABNORMAL HIGH (ref 11.5–15.5)
WBC: 4.8 10*3/uL (ref 4.0–10.5)

## 2017-09-22 LAB — COOXEMETRY PANEL
Carboxyhemoglobin: 1.5 % (ref 0.5–1.5)
Methemoglobin: 1.4 % (ref 0.0–1.5)
O2 Saturation: 63.6 %
Total hemoglobin: 11.6 g/dL — ABNORMAL LOW (ref 12.0–16.0)

## 2017-09-22 MED ORDER — TORSEMIDE 20 MG PO TABS
40.0000 mg | ORAL_TABLET | Freq: Every day | ORAL | Status: DC
  Administered 2017-09-22 – 2017-09-23 (×2): 40 mg via ORAL
  Filled 2017-09-22 (×2): qty 2

## 2017-09-22 MED ORDER — APIXABAN 5 MG PO TABS
5.0000 mg | ORAL_TABLET | Freq: Two times a day (BID) | ORAL | Status: DC
  Administered 2017-09-22 – 2017-09-23 (×3): 5 mg via ORAL
  Filled 2017-09-22 (×3): qty 1

## 2017-09-22 MED FILL — Heparin Sod (Porcine)-NaCl IV Soln 1000 Unit/500ML-0.9%: INTRAVENOUS | Qty: 500 | Status: AC

## 2017-09-22 NOTE — Progress Notes (Addendum)
Progress Note  Patient Name: Gina [name] Date of Encounter: 09/22/2017  Primary Cardiologist: Carlyle Dolly, MD   Subjective   Feeling "OK", minimal ache to her pacer site, no discomfort at R groin  Inpatient Medications    Scheduled Meds: . apixaban  5 mg Oral BID  . Chlorhexidine Gluconate Cloth  6 each Topical Daily  . Chlorhexidine Gluconate Cloth  6 each Topical Daily  . digoxin  0.0625 mg Oral Daily  . mupirocin ointment  1 application Nasal BID  . potassium chloride  40 mEq Oral Daily  . sodium chloride flush  10-40 mL Intracatheter Q12H  . sodium chloride flush  3 mL Intravenous Q12H  . spironolactone  25 mg Oral Daily  . torsemide  60 mg Oral Daily   Continuous Infusions: . sodium chloride Stopped (09/21/17 0730)   PRN Meds: sodium chloride, acetaminophen, bisacodyl, ipratropium, MUSCLE RUB, ondansetron (ZOFRAN) IV, sodium chloride, sodium chloride flush, traMADol   Vital Signs    Vitals:   09/22/17 0410 09/22/17 0500 09/22/17 0600 09/22/17 0700  BP:  117/86 102/69 120/80  Pulse:  89 90 90  Resp:  (!) 23 15 16   Temp:      TempSrc:      SpO2:  100% 100% 100%  Weight: 244 lb 11.4 oz (111 kg)     Height:        Intake/Output Summary (Last 24 hours) at 09/22/2017 0713 Last data filed at 09/22/2017 0556 Gross per 24 hour  Intake 615.14 ml  Output 3550 ml  Net -2934.86 ml   Filed Weights   09/20/17 0500 09/21/17 0600 09/22/17 0410  Weight: 247 lb 9.2 oz (112.3 kg) 236 lb 15.9 oz (107.5 kg) 244 lb 11.4 oz (111 kg)    Telemetry    V pacing at 90 - Personally Reviewed  ECG    No new EKGs today - Personally Reviewed  Physical Exam   GEN: No acute distress.   Neck: No JVD Cardiac: RRR, no murmurs, rubs, or gallops.  Respiratory: CTA b/l . GI: Soft, nontender, non-distended  MS: trace edema, ace wraps b/l LE; No deformity. Neuro:  Nonfocal  Psych: Normal affect   R groin, soft, nontender, no hematoma, no bleeding Pacer site old blood,  no active bleeding, no hematoma  Labs    Chemistry Recent Labs  Lab 09/20/17 0440 09/21/17 0352 09/22/17 0403  NA 139 141 140  K 3.6 3.4* 3.8  CL 98 99 95*  CO2 31 32 33*  GLUCOSE 115* 109* 100*  BUN 68* 65* 68*  CREATININE 2.45* 2.38* 2.44*  CALCIUM 8.6* 8.6* 8.8*  PROT 5.0* 4.8* 5.4*  ALBUMIN 2.7* 2.5* 2.7*  AST 14* 11* 11*  ALT 27 21 18   ALKPHOS 56 51 55  BILITOT 0.9 0.8 1.0  GFRNONAA 18* 19* 18*  GFRAA 21* 22* 21*  ANIONGAP 10 10 12      Hematology Recent Labs  Lab 09/20/17 0440 09/21/17 0352 09/22/17 0403  WBC 4.6 4.0 4.8  RBC 3.85* 3.74* 3.88  HGB 11.2* 11.0* 11.4*  HCT 37.4 36.1 38.1  MCV 97.1 96.5 98.2  MCH 29.1 29.4 29.4  MCHC 29.9* 30.5 29.9*  RDW 17.4* 17.6* 17.7*  PLT 283 257 258    Cardiac EnzymesNo results for input(s): TROPONINI in the last 168 hours. No results for input(s): TROPIPOC in the last 168 hours.   BNPNo results for input(s): BNP, PROBNP in the last 168 hours.   DDimer No results for input(s): DDIMER  in the last 168 hours.   Radiology    No results found.  Cardiac Studies   Echo7/11/2017 Left ventricle: The cavity size was mildly dilated. Wall thickness was increased in a pattern of moderate LVH. Systolic function was severely reduced. The estimated ejection fraction was = 20%. Diffuse hypokinesis. The study was not technically sufficient to allow evaluation of LV diastolic dysfunction due to atrial fibrillation. - Aortic valve: Mildly calcified annulus. Trileaflet; normal thickness leaflets. Valve area (VTI): 2.39 cm^2. Valve area (Vmax): 2.18 cm^2. - Mitral valve: There was mild regurgitation. - Left atrium: The atrium was severely dilated. - Right ventricle: The cavity size was mildly dilated. Systolic function was mildly to moderately reduced. - Right atrium: The atrium was moderately dilated. - Atrial septum: No defect or patent foramen ovale was identified. - Pulmonary arteries: Systolic pressure  was mildly increased. PA peak pressure: 37 mm Hg (S).   08/14/15: R/LHC 1. Angiographically normal coronary arteries 2. Nonischemic cardiomyopathy - conflicting cardiac outputs between Fick and thermodilution. Suspect that there is some valvular disease that is throwing off the thermodilution. 3. Essentially normal right heart pressures. Mildly elevated LVEDP and PCWP, but otherwise normal pressures.  Clearly nonischemic cardiomyopathy. The patient's itching anatomy with a tandem LAD system and codominant.  Plan:  Discharge after bedrest following femoral sheath removal.  Continue current management. She appears euvolemic   Patient Profile     77 y.o. female with a hx of NICM, HTN, HLD, OSA, chronic CHF (systolic), fairly new PAFib, CKD, LBBB, PVD w/ RAS, admitted with acute/chronic CHF exacerbation  Assessment & Plan    1. Persistent AFib     CHA2DS2Vasc is 4, on Eliquis, held for PPM, OK to resume today     Very poorly tolerated with worsening of her HF, uncontrolled rates     Despite a fairly new known diagnosis, her LA size is 35mm, and holding SR not felt to be likely for consideration of AAD tx  2. NICM 3. LBBB w/QRS 160ms     Dates back to 2015, in review of record pt has been hesitant about ICD  She is now s/p PPM implant with AV node ablation on [place] with Dr. Caryl Comes. Unable to get LV lead in, though with thoughts to bring back to the lab electively once well compensated and renal function improved to better tolerate the procedure and contrast needed.  Site is stable Wound care and activity restrictions were discussed with the patient (both LUE and R groin site), she mentions she has a bad l shoulder anyway CXR on POD #1 was w/o ptx Device check POD #1 with intact function EP follow up has been arranged   Our service will sign off, though remain available Please recall if needed  R groin instructions: No lifting over 5 lbs for 1 week. No vigorous or sexual  activity for 1 week. Keep procedure site clean & dry. If you notice increased pain, swelling, bleeding or pus, call/return!  No soaking baths/hot tubs/pools for 1 week.   Post pacer care instrutions Activity No heavy lifting or vigorous activity with your left/right arm for 6 to 8 weeks.  Do not raise your left/right arm above your head for one week.  Gradually raise your affected arm as drawn below.         09/23/17                  09/24/17  09/25/17                  09/26/17  WOUND CARE - Keep the wound area clean and dry.  Do not get this area wet for one week. No showers for one week; you may shower on 09/26/17  . - The tape/steri-strips on your wound will fall off; do not pull them off.  No bandage is needed on the site.  DO  NOT apply any creams, oils, or ointments to the wound area     For questions or updates, please contact [place] Please consult [email or web address] for contact info under Cardiology/STEMI.      Signed, Baldwin Jamaica, PA-C  09/22/2017, 7:13 AM    Patient much improved.  Currently off pressors.  We will anticipate reprogramming to heart rate of 80 in about 2 weeks.  This can be done as an outpatient.  The patient comes from [facility] and device follow-up would ideally be done in the evening.  Reassessment of LV function following control of heart rate as well as clinical symptoms would dictate potential role of a reattempt at CRT, efforts limited by unstable status and renal insufficiency limiting dye use--a CRT device is implanted

## 2017-09-22 NOTE — Discharge Summary (Signed)
Advanced Heart Failure Discharge Note  Discharge Summary   Patient ID: Gina [name] MRN: [id number], DOB/AGE: 03/08/1940 77 y.o. Admit date: 09/02/2017 D/C date:     09/23/17    Primary Discharge Diagnoses:  1. Acute on chronic systolic CHF 2. AKI on CKD stage 3 3. Atrial fibrillation s/p AV node ablation with PPM 09/19/17 4. Elevated LFTs 5. E coli UTI 6. Deconditioning  Hospital Course:   Gina [name] is a 77 y.o. female with h/o chronic systolic CHF, anemia, HTN, LBBB, renal artery stenosis, and OSA.   Admitted 09/02/2017 with peripheral edema and worsening SOB. Attempts at diuresis were difficult due to AKI. Transferred to [facility] 09/10/17 in setting of cardiogenic shock 2/2 Afib RVR.  She underwent DCCV 7/11 and 7/14. Discussed with EP after she went back into Afib a third time. Underwent DCCV 7/18 and stayed in for NSR most of the day, but then went back into Afib. Underwent AV nodal ablation and PPM implantation 09/19/17 with Dr. Caryl Comes. Unable to place an LV lead.   Further, problem based HPI as below. Please see most recent progress note for more in depth information.   1. Acute on chronic systolic CHF: - Echo (0/93) with EF 20%.Long-standing cardiomyopathy. Nonischemic based on cath in 2017. She hadlow output HF with co-ox 41%, CVP 17, AKI on CKD stage 3 ("cold and wet"). Exacerbation triggered by atrial fibrillation and also by being off Lasix for a number of days after hospitalization at [facility] for UTI. - Afib course as below.   - She transiently required milrinone, but tolerated wean with diuresis and restoration of NSR via PPM. - Will discuss with Dr. Caryl Comes re-attempting LV lead in the future. Plan to attempt as an outpatient. Set up outpatient follow up with Dr Caryl Comes.  2. AKI on CKD stage 3: -  Suspect cardiorenal syndrome with low output HF. No NSAID use. Creatinine 2.44. Stable.   3. Atrial fibrillation: - Admitted with afib/RVR, had been in afib for about 6 wks.  TSH normal.  Had been on Eliquis for about 4 weeks without missinganydoses. - Cardioverted to NSR 7/11 but back in atrial fibrillation with RVR by 7/12. Repeat DCCV to NSR in 7/14 after additional amiodarone loading but back in atrial fibrillation with RVR evening of 7/16. DCCV again 7/18 but back in afib by am 7/19.  - With clear poor tolerance, taken for AV AV nodal ablation with PPM 09/19/17 but unable to place LV lead. - Continue eliquis 5 mg BID.  4. Elevated LFTs: - Suspect shock liver with low cardiac output. LFTs trended down with med optimization including temporary inotrope support as above.  5. E coli UTI: - Completed ABX (ceftriaxone). Afebrile day of discharge.  6. Deconditioning:  - To SNF on d/c.   She was assessed am of 09/23/17 and determined to be stable for discharge to SNF. Bed availability confirmed.  CHF follow up as below.   Discharge Weight Range: 231 lbs Discharge Vitals: Blood pressure 117/80, pulse 90, temperature 98.1 F (36.7 C), temperature source Oral, resp. rate 11, height 5\' 4"  (1.626 m), weight 231 lb 7.7 oz (105 kg), SpO2 100 %.  Labs: Lab Results  Component Value Date   WBC 4.8 09/22/2017   HGB 11.4 (L) 09/22/2017   HCT 38.1 09/22/2017   MCV 98.2 09/22/2017   PLT 258 09/22/2017    Recent Labs  Lab 09/23/17 0514  NA 142  K 3.7  CL 96*  CO2 35*  BUN  67*  CREATININE 2.31*  CALCIUM 8.8*  PROT 5.3*  BILITOT 1.0  ALKPHOS 57  ALT 18  AST 13*  GLUCOSE 96   Lab Results  Component Value Date   CHOL  02/16/2008    110        ATP III CLASSIFICATION:  <200     mg/dL   Desirable  200-239  mg/dL   Borderline High  >=240    mg/dL   High   HDL 42 02/16/2008   LDLCALC  02/16/2008    52        Total Cholesterol/HDL:CHD Risk Coronary Heart Disease Risk Table                     Men   Women  1/2 Average Risk   3.4   3.3   TRIG 80 02/16/2008   BNP (last 3 results) Recent Labs    09/02/17 1512  BNP >4,500.0*    ProBNP (last 3  results) No results for input(s): PROBNP in the last 8760 hours.   Diagnostic Studies/Procedures   No results found.  Discharge Medications   Allergies as of 09/23/2017      Reactions   Cephalexin Rash   REACTION: Rash to arms/legs      Medication List    STOP taking these medications   aspirin 81 MG tablet   carvedilol 12.5 MG tablet Commonly known as:  COREG   colchicine 0.6 MG tablet   docusate sodium 100 MG capsule Commonly known as:  COLACE   ENTRESTO 49-51 MG Generic drug:  sacubitril-valsartan   furosemide 80 MG tablet Commonly known as:  LASIX   isosorbide mononitrate 60 MG 24 hr tablet Commonly known as:  IMDUR     TAKE these medications   acetaminophen 325 MG tablet Commonly known as:  TYLENOL Take 2 tablets (650 mg total) by mouth every 4 (four) hours as needed for headache or mild pain.   apixaban 5 MG Tabs tablet Commonly known as:  ELIQUIS Take 1 tablet (5 mg total) by mouth 2 (two) times daily.   bisacodyl 5 MG EC tablet Commonly known as:  DULCOLAX Take 2 tablets (10 mg total) by mouth daily as needed for moderate constipation.   calcitRIOL 0.25 MCG capsule Commonly known as:  ROCALTROL Take 0.25 mcg by mouth daily.   Digoxin 62.5 MCG Tabs Take 0.0625 mg by mouth daily.   ferrous sulfate 325 (65 FE) MG tablet Take 325 mg by mouth at bedtime.   ICAPS PO Take 1 capsule by mouth daily.   multivitamin tablet Take 1 tablet by mouth daily.   potassium chloride SA 20 MEQ tablet Commonly known as:  K-DUR,KLOR-CON Take 2 tablets (40 mEq total) by mouth daily. What changed:    how much to take  when to take this  reasons to take this  additional instructions   spironolactone 25 MG tablet Commonly known as:  ALDACTONE Take 1 tablet (25 mg total) by mouth daily.   torsemide 20 MG tablet Commonly known as:  DEMADEX Take 40 mg (2 tablets) daily, alternating with 60 mg (3 tablets) daily.   traMADol 50 MG tablet Commonly known  as:  ULTRAM Take 1 tablet (50 mg total) by mouth every 12 (twelve) hours as needed for up to 5 days for moderate pain or severe pain.       Disposition   The patient will be discharged in stable condition to SNF with close follow up as below.  Follow-up Information    Fran Lowes, MD Follow up in 3 week(s).   Specialty:  Nephrology Contact information: [address] [place] [address] [phone]        [facility] Follow up on 10/02/2017.   Specialty:  Cardiology Why:  11:00AM, wound check visit Contact information: [address] [place]       [facility] Follow up on 10/16/2017.   Specialty:  Cardiology Why:  11:00AM, routine pacemaker progamming visit Contact information: [address] [place]       Deboraha Sprang, MD Follow up on 01/12/2018.   Specialty:  Cardiology Why:  10:30AM Contact information: [address] [place] [address] [phone]        [facility] Follow up on 10/01/2017.   Specialty:  Cardiology Why:  Heart Failure Followup at [facility]-9:30-Parking/drop off at ER lot (enter under blue "Specialty Clinics" awning) or underneath [place] on [address] [place] (Women's entrance, garage code: 1400, elevator 1st floor). Take am meds, bring med list/bottles. Contact information: [address] [id number] [place] [phone]            Duration of Discharge Encounter: Greater than 35 minutes   Signed, Annamaria Helling 09/23/2017, 7:47 AM

## 2017-09-22 NOTE — Progress Notes (Signed)
Placed pt on home CPAP unit with home mask and 2L bled in. Added sterile water in chamber will cont to monitor

## 2017-09-22 NOTE — Progress Notes (Signed)
Physical Therapy Treatment Patient Details Name: Gina [name] MRN: [id number] DOB: 02-20-1941 Today's Date: 09/22/2017    History of Present Illness 77 yo transfer from [facility] to [facility] 7/10 with cardiogenic shock, Afib and volume overload s/p DCCV 7/11 with return to Afib post procedur.  Underwent DCCV again 7/14 PMHx: CHF, anemia, HTN, Left BBB, OSA, renal artery stenosis, CKD, CAD    PT Comments    Pt mobility greatly limited today by low back pain. Pt unable to stand without stedy. Pt with depressed spirits stating "I"m never going to get better like this". Pt with onset of 10/10 back pain upon sitting EOB and then worse with attempting to stand. Acute PT to cont. To follow.    Follow Up Recommendations  SNF;Supervision/Assistance - 24 hour     Equipment Recommendations  None recommended by PT    Recommendations for Other Services       Precautions / Restrictions Precautions Precautions: Fall Precaution Comments: watch HR, O2 and RR Restrictions Weight Bearing Restrictions: Yes LLE Weight Bearing: (pacemaker precautions)    Mobility  Bed Mobility Overal bed mobility: Needs Assistance Bed Mobility: Rolling;Sidelying to Sit;Sit to Sidelying Rolling: Min assist Sidelying to sit: Mod assist;+2 for physical assistance     Sit to sidelying: Mod assist;+2 for physical assistance General bed mobility comments: pt unable to pull with L UE to assist with transfers requiring increased assist from PT  Transfers Overall transfer level: Needs assistance   Transfers: Sit to/from Stand Sit to Stand: Total assist;+2 physical assistance Stand pivot transfers: (unable)       General transfer comment: pt unable to tolerate sit to stand today with assist from PT and tech due to 10/10 low back pain. trialed the stedy, pt able to complete 3 stands but unable to hold. modA to maintain full upright position due to inability to pull self up with L UE  Ambulation/Gait              General Gait Details: unable this date   Stairs             Wheelchair Mobility    Modified Rankin (Stroke Patients Only)       Balance Overall balance assessment: Needs assistance Sitting-balance support: Bilateral upper extremity supported;Feet unsupported Sitting balance-Leahy Scale: Poor Sitting balance - Comments: very reliant on UEs due to low back pain       Standing balance comment: unable to stand this date                            Cognition Arousal/Alertness: Awake/alert Behavior During Therapy: WFL for tasks assessed/performed Overall Cognitive Status: Within Functional Limits for tasks assessed                                        Exercises      General Comments General comments (skin integrity, edema, etc.): pt was on bed pan upon arrival, pt dependent for hygiene      Pertinent Vitals/Pain Pain Assessment: 0-10 Pain Score: 10-Worst pain ever Pain Location: lower back - 4/10 at rest, 10/10 with standing up Pain Descriptors / Indicators: Stabbing Pain Intervention(s): Limited activity within patient's tolerance    Home Living                      Prior Function  PT Goals (current goals can now be found in the care plan section) Acute Rehab PT Goals Patient Stated Goal: get rid of back pain Progress towards PT goals: Not progressing toward goals - comment(due to pain)    Frequency    Min 3X/week      PT Plan Current plan remains appropriate    Co-evaluation              AM-PAC PT "6 Clicks" Daily Activity  Outcome Measure  Difficulty turning over in bed (including adjusting bedclothes, sheets and blankets)?: Unable Difficulty moving from lying on back to sitting on the side of the bed? : Unable Difficulty sitting down on and standing up from a chair with arms (e.g., wheelchair, bedside commode, etc,.)?: Unable Help needed moving to and from a bed to chair (including a  wheelchair)?: Total Help needed walking in hospital room?: Total Help needed climbing 3-5 steps with a railing? : Total 6 Click Score: 6    End of Session Equipment Utilized During Treatment: Gait belt;Oxygen Activity Tolerance: Patient limited by pain Patient left: in bed;with call bell/phone within reach;with bed alarm set Nurse Communication: Mobility status;Precautions PT Visit Diagnosis: Unsteadiness on feet (R26.81);Other abnormalities of gait and mobility (R26.89);Muscle weakness (generalized) (M62.81)     Time: 1308-6578 PT Time Calculation (min) (ACUTE ONLY): 34 min  Charges:  $Therapeutic Activity: 23-37 mins                    G Codes:       Kittie Plater, PT, DPT Pager #: [phone] Office #: [phone]    Birney Belshe M Grae Cannata 09/22/2017, 1:54 PM

## 2017-09-22 NOTE — Progress Notes (Signed)
AD was completed-and copy to Unit secretary for file and 3 -original and copies to family. Conard Novak, Chaplain   09/22/17 1400  Clinical Encounter Type  Visited With Patient and family together  Visit Type Follow-up;Other (Comment) (finish AD)  Referral From Nurse  Consult/Referral To Chaplain  Spiritual Encounters  Spiritual Needs Other (Comment) (finish AD)  Stress Factors  Patient Stress Factors Not reviewed  Family Stress Factors Not reviewed  Advance Directives (For Healthcare)  Does Patient Have a Medical Advance Directive? Yes

## 2017-09-22 NOTE — Progress Notes (Signed)
Per MD note plan for DC to SNF tomorrow.  CSW spoke with [facility] and they confirm they will have bed available for patient tomorrow if stable  CSW will continue to follow  Jorge Ny, [place] Social Worker [phone]

## 2017-09-22 NOTE — Progress Notes (Addendum)
Patient ID: Gina [name], female   DOB: 12-23-1940, 77 y.o.   MRN: [id number]     Advanced Heart Failure Rounding Note  PCP-Cardiologist: Carlyle Dolly, MD   Subjective:    Transferred to [facility] 7/10.   S/P DC-CV with 7/11 conversion to NSR. Developed bradycardia so IV amio stopped and po amio started. Around 2200 on 7/11 she went back into A fib RVR.   Repeat DCCV to NSR 7/14.   She went back into atrial fibrillation with RVR 7/16 evening with worsening CHF.    She had to go back on milrinone gtt.  After discussion with Dr. Caryl Comes, [place] a 3rd time on 7/18 to NSR, stayed in NSR most of day but back in afib overnight.    On 7/19, she had AV nodal ablation and PPM implantation.  Dr. Caryl Comes was unable to place an LV lead.   Yesterday milrinone and lasix drip stopped.    Denies SOB.    Objective:   Weight Range: 244 lb 11.4 oz (111 kg) Body mass index is 42 kg/m.   Vital Signs:   Temp:  [97.3 F (36.3 C)-99.2 F (37.3 C)] 97.9 F (36.6 C) (07/22 0300) Pulse Rate:  [89-92] 89 (07/22 0500) Resp:  [5-49] 23 (07/22 0500) BP: (99-128)/(48-91) 117/86 (07/22 0500) SpO2:  [98 %-100 %] 100 % (07/22 0500) Weight:  [244 lb 11.4 oz (111 kg)] 244 lb 11.4 oz (111 kg) (07/22 0410) Last BM Date: 09/17/17  Weight change: Filed Weights   09/20/17 0500 09/21/17 0600 09/22/17 0410  Weight: 247 lb 9.2 oz (112.3 kg) 236 lb 15.9 oz (107.5 kg) 244 lb 11.4 oz (111 kg)    Intake/Output:   Intake/Output Summary (Last 24 hours) at 09/22/2017 0705 Last data filed at 09/22/2017 0556 Gross per 24 hour  Intake 615.14 ml  Output 3550 ml  Net -2934.86 ml      Physical Exam  CVP 6 personally checked.  General:  In bed. No resp difficulty HEENT: normal Neck: supple. JVP 5-6 . Carotids 2+ bilat; no bruits. No lymphadenopathy or thryomegaly appreciated.  Cor: PMI nondisplaced. Regular rate & rhythm. No rubs, gallops or murmurs. Left upper chest steri strips.  Lungs: clear Abdomen: soft,  nontender, nondistended. No hepatosplenomegaly. No bruits or masses. Good bowel sounds. Extremities: no cyanosis, clubbing, rash, edema Neuro: alert & orientedx3, cranial nerves grossly intact. moves all 4 extremities w/o difficulty. Affect pleasant GU: foley    Telemetry  A fib V paced 90   EKG    n/a  Labs    CBC Recent Labs    09/21/17 0352 09/22/17 0403  WBC 4.0 4.8  NEUTROABS 2.6 3.1  HGB 11.0* 11.4*  HCT 36.1 38.1  MCV 96.5 98.2  PLT 257 983   Basic Metabolic Panel Recent Labs    09/21/17 0352 09/22/17 0403  NA 141 140  K 3.4* 3.8  CL 99 95*  CO2 32 33*  GLUCOSE 109* 100*  BUN 65* 68*  CREATININE 2.38* 2.44*  CALCIUM 8.6* 8.8*   Liver Function Tests Recent Labs    09/21/17 0352 09/22/17 0403  AST 11* 11*  ALT 21 18  ALKPHOS 51 55  BILITOT 0.8 1.0  PROT 4.8* 5.4*  ALBUMIN 2.5* 2.7*   No results for input(s): LIPASE, AMYLASE in the last 72 hours. Cardiac Enzymes No results for input(s): CKTOTAL, CKMB, CKMBINDEX, TROPONINI in the last 72 hours.  BNP: BNP (last 3 results) Recent Labs    09/02/17 1512  BNP >  4,500.0*    ProBNP (last 3 results) No results for input(s): PROBNP in the last 8760 hours.   D-Dimer No results for input(s): DDIMER in the last 72 hours. Hemoglobin A1C No results for input(s): HGBA1C in the last 72 hours. Fasting Lipid Panel No results for input(s): CHOL, HDL, LDLCALC, TRIG, CHOLHDL, LDLDIRECT in the last 72 hours. Thyroid Function Tests No results for input(s): TSH, T4TOTAL, T3FREE, THYROIDAB in the last 72 hours.  Invalid input(s): FREET3  Other results:   Imaging    No results found.   Medications:     Scheduled Medications: . Chlorhexidine Gluconate Cloth  6 each Topical Daily  . Chlorhexidine Gluconate Cloth  6 each Topical Daily  . digoxin  0.0625 mg Oral Daily  . enoxaparin (LOVENOX) injection  30 mg Subcutaneous Q24H  . mupirocin ointment  1 application Nasal BID  . potassium chloride  40  mEq Oral Daily  . sodium chloride flush  10-40 mL Intracatheter Q12H  . sodium chloride flush  3 mL Intravenous Q12H  . spironolactone  25 mg Oral Daily  . torsemide  60 mg Oral Daily    Infusions: . sodium chloride Stopped (09/21/17 0730)    PRN Medications: sodium chloride, acetaminophen, bisacodyl, ipratropium, MUSCLE RUB, ondansetron (ZOFRAN) IV, sodium chloride, sodium chloride flush, traMADol    Patient Profile   Ms Gubbels is a 77 year old with a history chronic systolic heart failure, anemia, htn, LBBB, renal artery stenosis, and OSA.   Transferred to [facility] on 7/10 with cardiogenic shock, Afib RVR, and volume overload.  Assessment/Plan   1. Acute on chronic systolic CHF: Echo (6/04) with EF 20%. Long-standing cardiomyopathy. Nonischemic based on cath in 2017. She had low output HF with co-ox 41%, CVP 17, AKI on CKD stage 3 ("cold and wet"). Exacerbation triggered by atrial fibrillation and also by being off Lasix for a number of days after hospitalization at [facility] for UTI.  She went back into atrial fibrillation with RVR 7/16 evening with worsening of CHF, CXR with pulmonary edema.  Think worsening triggered by going back into AF. DCCV again 7/18 but back in afib the next morning.  7/19, she had AV nodal ablation with PPM but unable to place LV lead. CO-OX stable off milrinone--->64%.   -Volume status stable. Decrease torsemide to 40 mg daily. . - Continue lower dose of digoxin 0.0625 daily.  Check dig level as an outpatient.  in am.  - Continue spironolactone 25 mg daily.   - Will discuss with Dr. Caryl Comes re-attempting LV lead in the future. Plan to attempt as an outpatient. Set up outpatient follow up with Dr Caryl Comes.  2. AKI on CKD stage 3: Suspect cardiorenal syndrome with low output HF. No NSAID use.  Creatinine 2.44. Stable.   3. Atrial fibrillation: Admitted with afib/RVR, had been in afib for about 6 wks. TSH normal.  Had been on Eliquis for about 4 weeks without  missing any doses. Cardioverted to NSR 7/11 but back in atrial fibrillation with RVR by 7/12. Repeat DCCV to NSR in 7/14 after additional amiodarone loading but back in atrial fibrillation with RVR evening of 7/16.  DCCV again 7/18 but back in afib by am 7/19.  She clearly worsens when she goes back into afib with RVR.  She is now s/p AV nodal ablation with PPM.  - She is off amiodarone now.  - Restart eliquis 5 mg twice a day.   4. Elevated LFTs: Suspect shock liver with low cardiac  output. LFTs trended down.  5. ID: E coli UTI. She completed ceftriaxone.  6. Deconditioning: PT/OT consults. Out of bed.  Transfer to SDU.  Check with SW. Anticipate d/c in am.     Amy Clegg NP-C  09/22/2017 7:05 AM   Patient seen with NP, agree with the above note.  Co-ox stable at 64%.  Creatinine mildly higher 2.38=>2.44.  CVP 6. She had vigorous UOP yesterday.  - Decrease torsemide to 40 mg daily today.   PPM site stable, will restart Eliquis today.   May go to step-down, plan for discharge in am hopefully to SNF.   [name] 09/22/2017 7:26 AM

## 2017-09-22 NOTE — Discharge Instructions (Addendum)
Supplemental Discharge Instructions for  Pacemaker/Defibrillator Patients  Activity No heavy lifting or vigorous activity with your left/right arm for 6 to 8 weeks.  Do not raise your left/right arm above your head for one week.  Gradually raise your affected arm as drawn below.             09/23/17                      09/24/17                   09/25/17                    09/26/17 __  NO DRIVING for  1 week   ; you may begin driving on   2/69/48  .  WOUND CARE - Keep the wound area clean and dry.  Do not get this area wet for one week. No showers for one week; you may shower on  09/26/17  . - The tape/steri-strips on your wound will fall off; do not pull them off.  No bandage is needed on the site.  DO  NOT apply any creams, oils, or ointments to the wound area. - If you notice any drainage or discharge from the wound, any swelling or bruising at the site, or you develop a fever > 101? F after you are discharged home, call the office at once.  Special Instructions - You are still able to use cellular telephones; use the ear opposite the side where you have your pacemaker/defibrillator.  Avoid carrying your cellular phone near your device. - When traveling through airports, show security personnel your identification card to avoid being screened in the metal detectors.  Ask the security personnel to use the hand wand. - Avoid arc welding equipment, MRI testing (magnetic resonance imaging), TENS units (transcutaneous nerve stimulators).  Call the office for questions about other devices. - Avoid electrical appliances that are in poor condition or are not properly grounded. - Microwave ovens are safe to be near or to operate.     Information on my medicine - ELIQUIS (apixaban)  This medication education was reviewed with me or my healthcare representative as part of my discharge preparation.  The pharmacist that spoke with me during my hospital stay was:  Einar Grad, [facility]  Why  was Eliquis prescribed for you? Eliquis was prescribed for you to reduce the risk of a blood clot forming that can cause a stroke if you have a medical condition called atrial fibrillation (a type of irregular heartbeat).  What do You need to know about Eliquis ? Take your Eliquis TWICE DAILY - one tablet in the morning and one tablet in the evening with or without food. If you have difficulty swallowing the tablet whole please discuss with your pharmacist how to take the medication safely.  Take Eliquis exactly as prescribed by your doctor and DO NOT stop taking Eliquis without talking to the doctor who prescribed the medication.  Stopping may increase your risk of developing a stroke.  Refill your prescription before you run out.  After discharge, you should have regular check-up appointments with your healthcare provider that is prescribing your Eliquis.  In the future your dose may need to be changed if your kidney function or weight changes by a significant amount or as you get older.  What do you do if you miss a dose? If you miss a dose, take it  as soon as you remember on the same day and resume taking twice daily.  Do not take more than one dose of ELIQUIS at the same time to make up a missed dose.  Important Safety Information A possible side effect of Eliquis is bleeding. You should call your healthcare provider right away if you experience any of the following: ? Bleeding from an injury or your nose that does not stop. ? Unusual colored urine (red or dark brown) or unusual colored stools (red or black). ? Unusual bruising for unknown reasons. ? A serious fall or if you hit your head (even if there is no bleeding).  Some medicines may interact with Eliquis and might increase your risk of bleeding or clotting while on Eliquis. To help avoid this, consult your healthcare provider or pharmacist prior to using any new prescription or non-prescription medications, including  herbals, vitamins, non-steroidal anti-inflammatory drugs (NSAIDs) and supplements.  This website has more information on Eliquis (apixaban): http://www.eliquis.com/eliquis/home

## 2017-09-22 NOTE — Plan of Care (Signed)
  Problem: Activity: Goal: Risk for activity intolerance will decrease Outcome: Progressing   Problem: Pain Managment: Goal: General experience of comfort will improve Outcome: Progressing   Problem: Activity: Goal: Capacity to carry out activities will improve Outcome: Progressing

## 2017-09-23 DIAGNOSIS — M109 Gout, unspecified: Secondary | ICD-10-CM | POA: Diagnosis not present

## 2017-09-23 DIAGNOSIS — Z8249 Family history of ischemic heart disease and other diseases of the circulatory system: Secondary | ICD-10-CM | POA: Diagnosis not present

## 2017-09-23 DIAGNOSIS — I4891 Unspecified atrial fibrillation: Secondary | ICD-10-CM | POA: Diagnosis not present

## 2017-09-23 DIAGNOSIS — M79674 Pain in right toe(s): Secondary | ICD-10-CM | POA: Diagnosis not present

## 2017-09-23 DIAGNOSIS — I701 Atherosclerosis of renal artery: Secondary | ICD-10-CM | POA: Diagnosis not present

## 2017-09-23 DIAGNOSIS — N179 Acute kidney failure, unspecified: Secondary | ICD-10-CM | POA: Diagnosis not present

## 2017-09-23 DIAGNOSIS — R5381 Other malaise: Secondary | ICD-10-CM | POA: Diagnosis not present

## 2017-09-23 DIAGNOSIS — M6281 Muscle weakness (generalized): Secondary | ICD-10-CM | POA: Diagnosis not present

## 2017-09-23 DIAGNOSIS — I5022 Chronic systolic (congestive) heart failure: Secondary | ICD-10-CM | POA: Diagnosis not present

## 2017-09-23 DIAGNOSIS — M2550 Pain in unspecified joint: Secondary | ICD-10-CM | POA: Diagnosis not present

## 2017-09-23 DIAGNOSIS — D649 Anemia, unspecified: Secondary | ICD-10-CM | POA: Diagnosis not present

## 2017-09-23 DIAGNOSIS — I5042 Chronic combined systolic (congestive) and diastolic (congestive) heart failure: Secondary | ICD-10-CM | POA: Diagnosis not present

## 2017-09-23 DIAGNOSIS — Z7901 Long term (current) use of anticoagulants: Secondary | ICD-10-CM | POA: Diagnosis not present

## 2017-09-23 DIAGNOSIS — Z87891 Personal history of nicotine dependence: Secondary | ICD-10-CM | POA: Diagnosis not present

## 2017-09-23 DIAGNOSIS — I10 Essential (primary) hypertension: Secondary | ICD-10-CM | POA: Diagnosis not present

## 2017-09-23 DIAGNOSIS — M8580 Other specified disorders of bone density and structure, unspecified site: Secondary | ICD-10-CM | POA: Diagnosis not present

## 2017-09-23 DIAGNOSIS — M170 Bilateral primary osteoarthritis of knee: Secondary | ICD-10-CM | POA: Diagnosis not present

## 2017-09-23 DIAGNOSIS — E669 Obesity, unspecified: Secondary | ICD-10-CM | POA: Diagnosis not present

## 2017-09-23 DIAGNOSIS — I481 Persistent atrial fibrillation: Secondary | ICD-10-CM | POA: Diagnosis not present

## 2017-09-23 DIAGNOSIS — B9620 Unspecified Escherichia coli [E. coli] as the cause of diseases classified elsewhere: Secondary | ICD-10-CM | POA: Diagnosis not present

## 2017-09-23 DIAGNOSIS — I5023 Acute on chronic systolic (congestive) heart failure: Secondary | ICD-10-CM | POA: Diagnosis not present

## 2017-09-23 DIAGNOSIS — I482 Chronic atrial fibrillation: Secondary | ICD-10-CM | POA: Diagnosis not present

## 2017-09-23 DIAGNOSIS — N390 Urinary tract infection, site not specified: Secondary | ICD-10-CM | POA: Diagnosis not present

## 2017-09-23 DIAGNOSIS — Z79899 Other long term (current) drug therapy: Secondary | ICD-10-CM | POA: Diagnosis not present

## 2017-09-23 DIAGNOSIS — I442 Atrioventricular block, complete: Secondary | ICD-10-CM | POA: Diagnosis not present

## 2017-09-23 DIAGNOSIS — E875 Hyperkalemia: Secondary | ICD-10-CM | POA: Diagnosis not present

## 2017-09-23 DIAGNOSIS — I97190 Other postprocedural cardiac functional disturbances following cardiac surgery: Secondary | ICD-10-CM | POA: Diagnosis not present

## 2017-09-23 DIAGNOSIS — M5134 Other intervertebral disc degeneration, thoracic region: Secondary | ICD-10-CM | POA: Diagnosis not present

## 2017-09-23 DIAGNOSIS — I447 Left bundle-branch block, unspecified: Secondary | ICD-10-CM | POA: Diagnosis not present

## 2017-09-23 DIAGNOSIS — R2689 Other abnormalities of gait and mobility: Secondary | ICD-10-CM | POA: Diagnosis not present

## 2017-09-23 DIAGNOSIS — Z9981 Dependence on supplemental oxygen: Secondary | ICD-10-CM | POA: Diagnosis not present

## 2017-09-23 DIAGNOSIS — Z950 Presence of cardiac pacemaker: Secondary | ICD-10-CM | POA: Diagnosis not present

## 2017-09-23 DIAGNOSIS — B351 Tinea unguium: Secondary | ICD-10-CM | POA: Diagnosis not present

## 2017-09-23 DIAGNOSIS — R918 Other nonspecific abnormal finding of lung field: Secondary | ICD-10-CM | POA: Diagnosis not present

## 2017-09-23 DIAGNOSIS — I2510 Atherosclerotic heart disease of native coronary artery without angina pectoris: Secondary | ICD-10-CM | POA: Diagnosis not present

## 2017-09-23 DIAGNOSIS — I428 Other cardiomyopathies: Secondary | ICD-10-CM | POA: Diagnosis not present

## 2017-09-23 DIAGNOSIS — R9431 Abnormal electrocardiogram [ECG] [EKG]: Secondary | ICD-10-CM | POA: Diagnosis not present

## 2017-09-23 DIAGNOSIS — I517 Cardiomegaly: Secondary | ICD-10-CM | POA: Diagnosis not present

## 2017-09-23 DIAGNOSIS — R609 Edema, unspecified: Secondary | ICD-10-CM | POA: Diagnosis not present

## 2017-09-23 DIAGNOSIS — I429 Cardiomyopathy, unspecified: Secondary | ICD-10-CM | POA: Diagnosis not present

## 2017-09-23 DIAGNOSIS — R945 Abnormal results of liver function studies: Secondary | ICD-10-CM | POA: Diagnosis not present

## 2017-09-23 DIAGNOSIS — G4733 Obstructive sleep apnea (adult) (pediatric): Secondary | ICD-10-CM | POA: Diagnosis not present

## 2017-09-23 DIAGNOSIS — I130 Hypertensive heart and chronic kidney disease with heart failure and stage 1 through stage 4 chronic kidney disease, or unspecified chronic kidney disease: Secondary | ICD-10-CM | POA: Diagnosis not present

## 2017-09-23 DIAGNOSIS — J90 Pleural effusion, not elsewhere classified: Secondary | ICD-10-CM | POA: Diagnosis not present

## 2017-09-23 DIAGNOSIS — M1A39X0 Chronic gout due to renal impairment, multiple sites, without tophus (tophi): Secondary | ICD-10-CM | POA: Diagnosis not present

## 2017-09-23 DIAGNOSIS — N183 Chronic kidney disease, stage 3 (moderate): Secondary | ICD-10-CM | POA: Diagnosis not present

## 2017-09-23 DIAGNOSIS — Z7401 Bed confinement status: Secondary | ICD-10-CM | POA: Diagnosis not present

## 2017-09-23 DIAGNOSIS — M79675 Pain in left toe(s): Secondary | ICD-10-CM | POA: Diagnosis not present

## 2017-09-23 DIAGNOSIS — I351 Nonrheumatic aortic (valve) insufficiency: Secondary | ICD-10-CM | POA: Diagnosis not present

## 2017-09-23 DIAGNOSIS — D631 Anemia in chronic kidney disease: Secondary | ICD-10-CM | POA: Diagnosis not present

## 2017-09-23 DIAGNOSIS — B372 Candidiasis of skin and nail: Secondary | ICD-10-CM | POA: Diagnosis not present

## 2017-09-23 LAB — COMPREHENSIVE METABOLIC PANEL
ALT: 18 U/L (ref 0–44)
AST: 13 U/L — ABNORMAL LOW (ref 15–41)
Albumin: 2.7 g/dL — ABNORMAL LOW (ref 3.5–5.0)
Alkaline Phosphatase: 57 U/L (ref 38–126)
Anion gap: 11 (ref 5–15)
BUN: 67 mg/dL — ABNORMAL HIGH (ref 8–23)
CO2: 35 mmol/L — ABNORMAL HIGH (ref 22–32)
Calcium: 8.8 mg/dL — ABNORMAL LOW (ref 8.9–10.3)
Chloride: 96 mmol/L — ABNORMAL LOW (ref 98–111)
Creatinine, Ser: 2.31 mg/dL — ABNORMAL HIGH (ref 0.44–1.00)
GFR calc Af Amer: 22 mL/min — ABNORMAL LOW (ref >60)
GFR calc non Af Amer: 19 mL/min — ABNORMAL LOW (ref >60)
Glucose, Bld: 96 mg/dL (ref 70–99)
Potassium: 3.7 mmol/L (ref 3.5–5.1)
Sodium: 142 mmol/L (ref 135–145)
Total Bilirubin: 1 mg/dL (ref 0.3–1.2)
Total Protein: 5.3 g/dL — ABNORMAL LOW (ref 6.5–8.1)

## 2017-09-23 LAB — COOXEMETRY PANEL
Carboxyhemoglobin: 1.6 % — ABNORMAL HIGH (ref 0.5–1.5)
Methemoglobin: 1.5 % (ref 0.0–1.5)
O2 Saturation: 64 %
Total hemoglobin: 11 g/dL — ABNORMAL LOW (ref 12.0–16.0)

## 2017-09-23 MED ORDER — DIGOXIN 62.5 MCG PO TABS: 0.0625 mg | ORAL_TABLET | Freq: Every day | ORAL | 6 refills | Status: DC

## 2017-09-23 MED ORDER — POTASSIUM CHLORIDE CRYS ER 20 MEQ PO TBCR: 40.0000 meq | EXTENDED_RELEASE_TABLET | Freq: Every day | ORAL | 6 refills | Status: DC

## 2017-09-23 MED ORDER — SPIRONOLACTONE 25 MG PO TABS: 25.0000 mg | ORAL_TABLET | Freq: Every day | ORAL | 6 refills | Status: DC

## 2017-09-23 MED ORDER — ACETAMINOPHEN 325 MG PO TABS: 650.0000 mg | ORAL_TABLET | ORAL | Status: DC | PRN

## 2017-09-23 MED ORDER — TRAMADOL HCL 50 MG PO TABS: 50.0000 mg | ORAL_TABLET | Freq: Two times a day (BID) | ORAL | 0 refills | Status: AC | PRN

## 2017-09-23 MED ORDER — BISACODYL 5 MG PO TBEC: 10.0000 mg | DELAYED_RELEASE_TABLET | Freq: Every day | ORAL | 0 refills | Status: DC | PRN

## 2017-09-23 MED ORDER — TORSEMIDE 20 MG PO TABS: ORAL_TABLET | ORAL | 6 refills | Status: DC

## 2017-09-23 NOTE — Progress Notes (Signed)
RN called report to Marita [place], Therapist, sports from [facility]. All lines removed from Pt, and belongings packed. Awaiting transport to SNF.

## 2017-09-23 NOTE — Progress Notes (Signed)
Patient picked up by PTAR at this time. All of pts belongs with patient at this time.

## 2017-09-23 NOTE — Progress Notes (Signed)
Patient ID: Gina [name], female   DOB: 10-22-1940, 77 y.o.   MRN: [id number]     Advanced Heart Failure Rounding Note  PCP-Cardiologist: Carlyle Dolly, MD   Subjective:    Transferred to [facility] 7/10.   S/P DC-CV with 7/11 conversion to NSR. Developed bradycardia so IV amio stopped and po amio started. Around 2200 on 7/11 she went back into A fib RVR.   Repeat DCCV to NSR 7/14.   She went back into atrial fibrillation with RVR 7/16 evening with worsening CHF.    She had to go back on milrinone gtt.  After discussion with Dr. Caryl Comes, [place] a 3rd time on 7/18 to NSR, stayed in NSR most of day but back in afib overnight.    On 7/19, she had AV nodal ablation and PPM implantation.  Dr. Caryl Comes was unable to place an LV lead.   Milrinone stopped 7/21.  Today, co-ox 64% with CVP 9-10.   Denies SOB.    Objective:   Weight Range: 231 lb 7.7 oz (105 kg) Body mass index is 39.73 kg/m.   Vital Signs:   Temp:  [97.7 F (36.5 C)-98.4 F (36.9 C)] 98.1 F (36.7 C) (07/23 0416) Pulse Rate:  [87-91] 90 (07/23 0600) Resp:  [11-49] 11 (07/23 0600) BP: (105-137)/(61-86) 117/80 (07/23 0600) SpO2:  [86 %-100 %] 100 % (07/23 0600) Weight:  [231 lb 7.7 oz (105 kg)] 231 lb 7.7 oz (105 kg) (07/23 0500) Last BM Date: 09/22/17  Weight change: Filed Weights   09/21/17 0600 09/22/17 0410 09/23/17 0500  Weight: 236 lb 15.9 oz (107.5 kg) 244 lb 11.4 oz (111 kg) 231 lb 7.7 oz (105 kg)    Intake/Output:   Intake/Output Summary (Last 24 hours) at 09/23/2017 0734 Last data filed at 09/23/2017 0600 Gross per 24 hour  Intake 720 ml  Output 1750 ml  Net -1030 ml      Physical Exam  CVP 9-10 personally checked.  General: NAD Neck: JVP 8-9 cm, no thyromegaly or thyroid nodule.  Lungs: Clear to auscultation bilaterally with normal respiratory effort. CV: Nonpalpable PMI.  Heart regular S1/S2, no S3/S4, no murmur.  No peripheral edema.   Abdomen: Soft, nontender, no hepatosplenomegaly, no  distention.  Skin: Intact without lesions or rashes.  Neurologic: Alert and oriented x 3.  Psych: Normal affect. Extremities: No clubbing or cyanosis.  HEENT: Normal.    Telemetry  A fib V paced 90 (personally reviewed)  EKG    n/a  Labs    CBC Recent Labs    09/21/17 0352 09/22/17 0403  WBC 4.0 4.8  NEUTROABS 2.6 3.1  HGB 11.0* 11.4*  HCT 36.1 38.1  MCV 96.5 98.2  PLT 257 122   Basic Metabolic Panel Recent Labs    09/22/17 0403 09/23/17 0514  NA 140 142  K 3.8 3.7  CL 95* 96*  CO2 33* 35*  GLUCOSE 100* 96  BUN 68* 67*  CREATININE 2.44* 2.31*  CALCIUM 8.8* 8.8*   Liver Function Tests Recent Labs    09/22/17 0403 09/23/17 0514  AST 11* 13*  ALT 18 18  ALKPHOS 55 57  BILITOT 1.0 1.0  PROT 5.4* 5.3*  ALBUMIN 2.7* 2.7*   No results for input(s): LIPASE, AMYLASE in the last 72 hours. Cardiac Enzymes No results for input(s): CKTOTAL, CKMB, CKMBINDEX, TROPONINI in the last 72 hours.  BNP: BNP (last 3 results) Recent Labs    09/02/17 1512  BNP >4,500.0*    ProBNP (last 3  results) No results for input(s): PROBNP in the last 8760 hours.   D-Dimer No results for input(s): DDIMER in the last 72 hours. Hemoglobin A1C No results for input(s): HGBA1C in the last 72 hours. Fasting Lipid Panel No results for input(s): CHOL, HDL, LDLCALC, TRIG, CHOLHDL, LDLDIRECT in the last 72 hours. Thyroid Function Tests No results for input(s): TSH, T4TOTAL, T3FREE, THYROIDAB in the last 72 hours.  Invalid input(s): FREET3  Other results:   Imaging    No results found.   Medications:     Scheduled Medications: . apixaban  5 mg Oral BID  . Chlorhexidine Gluconate Cloth  6 each Topical Daily  . Chlorhexidine Gluconate Cloth  6 each Topical Daily  . digoxin  0.0625 mg Oral Daily  . potassium chloride  40 mEq Oral Daily  . sodium chloride flush  10-40 mL Intracatheter Q12H  . sodium chloride flush  3 mL Intravenous Q12H  . spironolactone  25 mg Oral  Daily  . torsemide  40 mg Oral Daily    Infusions: . sodium chloride Stopped (09/21/17 0730)    PRN Medications: sodium chloride, acetaminophen, bisacodyl, ipratropium, MUSCLE RUB, ondansetron (ZOFRAN) IV, sodium chloride, sodium chloride flush, traMADol    Patient Profile   Ms Friberg is a 77 year old with a history chronic systolic heart failure, anemia, htn, LBBB, renal artery stenosis, and OSA.   Transferred to [facility] on 7/10 with cardiogenic shock, Afib RVR, and volume overload.  Assessment/Plan   1. Acute on chronic systolic CHF: Echo (6/96) with EF 20%. Long-standing cardiomyopathy. Nonischemic based on cath in 2017. She had low output HF with co-ox 41%, CVP 17, AKI on CKD stage 3 ("cold and wet"). Exacerbation triggered by atrial fibrillation and also by being off Lasix for a number of days after hospitalization at [facility] for UTI.  She went back into atrial fibrillation with RVR 7/16 evening with worsening of CHF, CXR with pulmonary edema.  Think worsening triggered by going back into AF. DCCV again 7/18 but back in afib the next morning.  7/19, she had AV nodal ablation with PPM but unable to place LV lead.  Co-ox stable off milrinone at 64%, CVP 9-10 today.   - For home, will have her take torsemide 40 mg daily alternating with 60 mg daily.  - Continue lower dose of digoxin 0.0625 daily.  Check dig level as an outpatient.  - Continue spironolactone 25 mg daily.   - Plan to re-attempt LV lead as an outpatient. Set up outpatient follow up with Dr Caryl Comes.  2. AKI on CKD stage 3: Suspect cardiorenal syndrome with low output HF. No NSAID use.  Creatinine 2.31. Stable.   3. Atrial fibrillation: Admitted with afib/RVR, had been in afib for about 6 wks. TSH normal.  Had been on Eliquis for about 4 weeks without missing any doses. Cardioverted to NSR 7/11 but back in atrial fibrillation with RVR by 7/12. Repeat DCCV to NSR in 7/14 after additional amiodarone loading but back in  atrial fibrillation with RVR evening of 7/16.  DCCV again 7/18 but back in afib by am 7/19.  She clearly worsens when she goes back into afib with RVR.  She is now s/p AV nodal ablation with PPM.  - She is off amiodarone now.  - Continue Eliquis 5 mg twice a day.   4. Elevated LFTs: Suspect shock liver with low cardiac output. LFTs trended down.  5. ID: E coli UTI. She completed ceftriaxone.  6. Deconditioning: PT/OT  consults. Out of bed. 7. Disposition: She can go to SNF today.  She will need digoxin level checked later this week along with BMET.  She will need followup in CHF clinic in about 10 days.  Needs followup with Dr. Caryl Comes.  Meds for home: torsemide 40 mg daily alternating with 60 mg daily, KCl 40 daily, spironolactone 25 daily, digoxin 0.0625 daily, Eliquis 5 mg bid.   [name] 09/23/2017 7:34 AM

## 2017-09-23 NOTE — Progress Notes (Signed)
CARDIAC REHAB PHASE I   Pt given CHF booklet. Reviewed importance of daily weights, mobility, and low sodium diet. Pt very knowledgeable and appreciative of additional information.   [place], RN BSN 09/23/2017 2:24 PM

## 2017-09-23 NOTE — Progress Notes (Signed)
Occupational Therapy Treatment Patient Details Name: Gina [name] MRN: [id number] DOB: November 21, 1940 Today's Date: 09/23/2017    History of present illness 77 yo transfer from [facility] to [facility] 7/10 with cardiogenic shock, Afib and volume overload s/p DCCV 7/11 with return to Afib post procedur.  Underwent DCCV again 7/14. Pacemaker placed 09/19/17. PMHx: CHF, anemia, HTN, Left BBB, OSA, renal artery stenosis, CKD, CAD   OT comments  Pt progressing towards established goals. Pt requiring Total A for donning socks. Pt performing simulated toilet transfer with Min-Mod A +2 and RW. Providing pt with handout and education on pacemaker and LUE ROM limitations. Pt verbalized understanding. Educating pt on edema management of LUE through elevation and ROM. Continue to recommend dc to SNF and will continue to follow acutely as admitted.    Follow Up Recommendations  SNF    Equipment Recommendations  None recommended by OT    Recommendations for Other Services      Precautions / Restrictions Precautions Precautions: Fall Precaution Comments: watch HR, O2 and RR Restrictions Weight Bearing Restrictions: Yes LUE Weight Bearing: (Pacemaker. Limited ROM and not push/pull)       Mobility Bed Mobility Overal bed mobility: Needs Assistance Bed Mobility: Supine to Sit     Supine to sit: Mod assist;HOB elevated     General bed mobility comments: Pt bringing BLEs towards EOB and then using bedrail with RUE to pull towards EOB. Mod A for elevate trunk with HOB elevated. Pt avoiding use of  due to pacemaker placement.   Transfers Overall transfer level: Needs assistance Equipment used: Rolling walker (2 wheeled) Transfers: Sit to/from [facility] Sit to Stand: Mod assist;+2 physical assistance Stand pivot transfers: Min assist;+2 physical assistance       General transfer comment: Mod A +2 to power up into standing from EOB. Pt requiring cues for forward weight shift during  transfer due to tendency for posterior lean and extension at hips. Min A +2 for balance during pivot towards left to recliner.    Balance Overall balance assessment: Needs assistance Sitting-balance support: No upper extremity supported;Feet supported;Single extremity supported(RUE) Sitting balance-Leahy Scale: Poor Sitting balance - Comments: Pt with posterior and left lateral lean seated at EOB. Pt requiring cues for forward weight shift and to correct posture.  Postural control: Posterior lean;Left lateral lean Standing balance support: Bilateral upper extremity supported;No upper extremity supported Standing balance-Leahy Scale: Poor Standing balance comment: Reliant on UE support                           ADL either performed or assessed with clinical judgement   ADL Overall ADL's : Needs assistance/impaired Eating/Feeding: Independent Eating/Feeding Details (indicate cue type and reason): Pt eating breakfast in recliner at end of session                 Lower Body Dressing: Maximal assistance;Bed level Lower Body Dressing Details (indicate cue type and reason): Donning socks at bed level. Pt with decreased forward flexion due to significant back pain Toilet Transfer: Moderate assistance;Stand-pivot;+2 for physical assistance;RW(Simulated to recliner) Toilet Transfer Details (indicate cue type and reason): Requiring VCs for forward weight shift. Pt presenting with tendency for posterior and left lean in sitting. Mod A +2 for power up into standing from EOB. Pt requiring MIn A +2 for balance during pivot to recliner.          Functional mobility during ADLs: Moderate assistance;Minimal assistance;+2 for physical  assistance;Rolling walker(Stand pivot only) General ADL Comments: Pt performing stand pivot to recliner with Min-Mod A +2. Providing pacemaker handout and reviewing information. Educating pt on edema management. Pt utilizing purse lip breathing throughout  session     Vision       Perception     Praxis      Cognition Arousal/Alertness: Awake/alert Behavior During Therapy: WFL for tasks assessed/performed Overall Cognitive Status: Within Functional Limits for tasks assessed                                          Exercises Exercises: Other exercises;General Upper Extremity General Exercises - Upper Extremity Elbow Flexion: AROM;Left;10 reps;Seated Elbow Extension: AROM;Left;10 reps;Seated Digit Composite Flexion: AROM;Left;10 reps;Seated Composite Extension: AROM;Left;10 reps;Seated Other Exercises Other Exercises: Educating pt on edema management for LUE through elevation and ROM   Shoulder Instructions       General Comments BP 113/82. RR 20-30s. HR 90-113. SpO2 90s on 2L.    Pertinent Vitals/ Pain       Pain Assessment: Faces Faces Pain Scale: Hurts even more Pain Location: Lower back pain Pain Descriptors / Indicators: Discomfort;Grimacing Pain Intervention(s): Monitored during session;Limited activity within patient's tolerance;Repositioned  Home Living Family/patient expects to be discharged to:: Skilled nursing facility                                        Prior Functioning/Environment              Frequency  Min 2X/week        Progress Toward Goals  OT Goals(current goals can now be found in the care plan section)  Progress towards OT goals: Progressing toward goals  Acute Rehab OT Goals Patient Stated Goal: get rid of back pain OT Goal Formulation: With patient Time For Goal Achievement: 09/28/17 Potential to Achieve Goals: Good ADL Goals Pt Will Perform Grooming: with min assist;standing Pt Will Perform Upper Body Bathing: with supervision;with set-up;sitting Pt Will Perform Lower Body Bathing: with min assist;sit to/from stand Pt Will Perform Upper Body Dressing: with set-up;with supervision;sitting Pt Will Perform Lower Body Dressing: with min  assist;sit to/from stand Pt Will Transfer to Toilet: with min assist;ambulating;regular height toilet;bedside commode;grab bars Pt Will Perform Toileting - Clothing Manipulation and hygiene: with min assist;sit to/from stand Pt/caregiver will Perform Home Exercise Program: Increased strength;Right Upper extremity;Left upper extremity;With theraband;With Supervision;With written HEP provided  Plan Discharge plan remains appropriate    Co-evaluation                 AM-PAC PT "6 Clicks" Daily Activity     Outcome Measure   Help from another person eating meals?: None Help from another person taking care of personal grooming?: A Little Help from another person toileting, which includes using toliet, bedpan, or urinal?: A Lot Help from another person bathing (including washing, rinsing, drying)?: A Lot Help from another person to put on and taking off regular upper body clothing?: A Little Help from another person to put on and taking off regular lower body clothing?: Total 6 Click Score: 15    End of Session Equipment Utilized During Treatment: Gait belt;Rolling walker;Oxygen  OT Visit Diagnosis: Unsteadiness on feet (R26.81)   Activity Tolerance Patient tolerated treatment well;Patient limited by pain   Patient Left in  chair;with call bell/phone within reach;with chair alarm set   Nurse Communication Mobility status        Time: [phone] OT Time Calculation (min): 22 min  Charges: OT General Charges $OT Visit: 1 Visit OT Treatments $Self Care/Home Management : 8-22 mins  [place], OTR/L Acute Rehab Pager: [phone] Office: [place] 09/23/2017, 9:06 AM

## 2017-09-23 NOTE — Progress Notes (Signed)
Patient will discharge to [facility] Anticipated discharge date: 7/23 Transportation by [facility]- scheduled for 1pm  CSW signing off.  Jorge Ny, LCSW Clinical Social Worker [phone]

## 2017-09-23 NOTE — Care Management Note (Signed)
Case Management Note Previous Note Created by [facility] Patient Details  Name: Gina [name] MRN: [id number] Date of Birth: 1940-03-15  Subjective/Objective:   CHF. From home with husband. Ind with ADL's. Has rollator walker. Active with [facility] home health for RN and PT.  Currently on room.       Action/Plan: DC home with home health. Santiago Glad of [facility] aware of admission and will follow for orders.   Expected Discharge Date:  09/23/17  09/04/2017             Expected Discharge Plan:  [place]  In-House Referral:  Clinical Social Work  Discharge planning Services  CM Consult  Post Acute Care Choice:  Home Health, Resumption of Svcs/PTA Provider Choice offered to:  Patient  DME Arranged:    DME Agency:     HH Arranged:  RN, PT [place] Agency:  [place]  Status of Service:  Completed, signed off  If discussed at Long Length of Stay Meetings, dates discussed:    Discharge Disposition: skilled facility   Additional Comments:   09/23/17- 1120- Florian Chauca RN, CM- pt for discharge today to SNF, CSW following for transition of care needs- plan is for [facility].   Maryclare Labrador, RN 09/19/2017, 10:47 AM--  Pt remains appropriate for continued stay.  Pt remains on IV milrinone and lasix drip. Pt has had unsuccessful cardioversion - another one planned for this afternoon.  CSW continuing to follow for SNF when medically stable  09/15/17 Pt in ICU at [facility].  SNF reccommended - CSW following   Dawayne Patricia, RN 09/23/2017, 11:21 AM Unit 2H 1-22 Case Manager  [phone]

## 2017-09-23 NOTE — Clinical Social Work Placement (Signed)
   CLINICAL SOCIAL WORK PLACEMENT  NOTE  Date:  09/23/2017  Patient Details  Name: Gina [name] MRN: [id number] Date of Birth: 09/08/1940  Clinical Social Work is seeking post-discharge placement for this patient at the [place] level of care (*CSW will initial, date and re-position this form in  chart as items are completed):  Yes   Patient/family provided with [place] Work Department's list of facilities offering this level of care within the geographic area requested by the patient (or if unable, by the patient's family).  Yes   Patient/family informed of their freedom to choose among providers that offer the needed level of care, that participate in Medicare, Medicaid or managed care program needed by the patient, have an available bed and are willing to accept the patient.  Yes   Patient/family informed of [facility]'s ownership interest in [facility] and [facility], as well as of the fact that they are under no obligation to receive care at these facilities.  PASRR submitted to EDS on 09/12/17     PASRR number received on 09/12/17     Existing PASRR number confirmed on       FL2 transmitted to all facilities in geographic area requested by pt/family on 09/12/17     FL2 transmitted to all facilities within larger geographic area on       Patient informed that his/her managed care company has contracts with or will negotiate with certain facilities, including the following:        Yes   Patient/family informed of bed offers received.  Patient chooses bed at [facility] and [facility]     Physician recommends and patient chooses bed at      Patient to be transferred to [facility] on 09/23/17.  Patient to be transferred to facility by ptar     Patient family notified on 09/23/17 of transfer.  Name of family member notified:        PHYSICIAN Please sign FL2     Additional Comment:     _______________________________________________ Jorge Ny, LCSW 09/23/2017, 9:48 AM

## 2017-09-24 DIAGNOSIS — N390 Urinary tract infection, site not specified: Secondary | ICD-10-CM | POA: Diagnosis not present

## 2017-09-24 DIAGNOSIS — I701 Atherosclerosis of renal artery: Secondary | ICD-10-CM | POA: Diagnosis not present

## 2017-09-24 DIAGNOSIS — I5023 Acute on chronic systolic (congestive) heart failure: Secondary | ICD-10-CM | POA: Diagnosis not present

## 2017-09-24 DIAGNOSIS — B9620 Unspecified Escherichia coli [E. coli] as the cause of diseases classified elsewhere: Secondary | ICD-10-CM | POA: Diagnosis not present

## 2017-09-26 DIAGNOSIS — M6281 Muscle weakness (generalized): Secondary | ICD-10-CM | POA: Diagnosis not present

## 2017-09-26 DIAGNOSIS — N183 Chronic kidney disease, stage 3 (moderate): Secondary | ICD-10-CM | POA: Diagnosis not present

## 2017-09-26 DIAGNOSIS — I4891 Unspecified atrial fibrillation: Secondary | ICD-10-CM | POA: Diagnosis not present

## 2017-09-26 DIAGNOSIS — Z79899 Other long term (current) drug therapy: Secondary | ICD-10-CM | POA: Diagnosis not present

## 2017-09-30 ENCOUNTER — Institutional Professional Consult (permissible substitution): Payer: 59 | Admitting: Internal Medicine

## 2017-10-01 ENCOUNTER — Encounter (HOSPITAL_COMMUNITY): Payer: Self-pay

## 2017-10-01 ENCOUNTER — Ambulatory Visit (HOSPITAL_COMMUNITY)
Admission: RE | Admit: 2017-10-01 | Discharge: 2017-10-01 | Disposition: A | Discharge status: Home / Self Care | Payer: Medicare Other | Source: Ambulatory Visit | Attending: Cardiology | Admitting: Cardiology

## 2017-10-01 ENCOUNTER — Ambulatory Visit: Payer: Medicare Other

## 2017-10-01 VITALS — BP 118/78 | HR 90

## 2017-10-01 DIAGNOSIS — N183 Chronic kidney disease, stage 3 unspecified: Secondary | ICD-10-CM

## 2017-10-01 DIAGNOSIS — I10 Essential (primary) hypertension: Secondary | ICD-10-CM

## 2017-10-01 DIAGNOSIS — I5042 Chronic combined systolic (congestive) and diastolic (congestive) heart failure: Secondary | ICD-10-CM | POA: Diagnosis not present

## 2017-10-01 DIAGNOSIS — I4819 Other persistent atrial fibrillation: Secondary | ICD-10-CM

## 2017-10-01 DIAGNOSIS — I447 Left bundle-branch block, unspecified: Secondary | ICD-10-CM

## 2017-10-01 DIAGNOSIS — I130 Hypertensive heart and chronic kidney disease with heart failure and stage 1 through stage 4 chronic kidney disease, or unspecified chronic kidney disease: Secondary | ICD-10-CM | POA: Diagnosis not present

## 2017-10-01 DIAGNOSIS — I2510 Atherosclerotic heart disease of native coronary artery without angina pectoris: Secondary | ICD-10-CM | POA: Insufficient documentation

## 2017-10-01 DIAGNOSIS — I428 Other cardiomyopathies: Secondary | ICD-10-CM | POA: Insufficient documentation

## 2017-10-01 DIAGNOSIS — I481 Persistent atrial fibrillation: Secondary | ICD-10-CM | POA: Diagnosis not present

## 2017-10-01 DIAGNOSIS — D649 Anemia, unspecified: Secondary | ICD-10-CM | POA: Insufficient documentation

## 2017-10-01 DIAGNOSIS — Z87891 Personal history of nicotine dependence: Secondary | ICD-10-CM | POA: Insufficient documentation

## 2017-10-01 DIAGNOSIS — R9431 Abnormal electrocardiogram [ECG] [EKG]: Secondary | ICD-10-CM | POA: Insufficient documentation

## 2017-10-01 DIAGNOSIS — G4733 Obstructive sleep apnea (adult) (pediatric): Secondary | ICD-10-CM | POA: Insufficient documentation

## 2017-10-01 DIAGNOSIS — Z79899 Other long term (current) drug therapy: Secondary | ICD-10-CM | POA: Insufficient documentation

## 2017-10-01 DIAGNOSIS — R5381 Other malaise: Secondary | ICD-10-CM | POA: Diagnosis not present

## 2017-10-01 DIAGNOSIS — I5022 Chronic systolic (congestive) heart failure: Secondary | ICD-10-CM | POA: Diagnosis not present

## 2017-10-01 DIAGNOSIS — I701 Atherosclerosis of renal artery: Secondary | ICD-10-CM | POA: Insufficient documentation

## 2017-10-01 DIAGNOSIS — Z7901 Long term (current) use of anticoagulants: Secondary | ICD-10-CM | POA: Insufficient documentation

## 2017-10-01 DIAGNOSIS — Z8249 Family history of ischemic heart disease and other diseases of the circulatory system: Secondary | ICD-10-CM | POA: Insufficient documentation

## 2017-10-01 LAB — BASIC METABOLIC PANEL
Anion gap: 16 — ABNORMAL HIGH (ref 5–15)
BUN: 116 mg/dL — ABNORMAL HIGH (ref 8–23)
CO2: 23 mmol/L (ref 22–32)
Calcium: 9.4 mg/dL (ref 8.9–10.3)
Chloride: 98 mmol/L (ref 98–111)
Creatinine, Ser: 3.47 mg/dL — ABNORMAL HIGH (ref 0.44–1.00)
GFR calc Af Amer: 14 mL/min — ABNORMAL LOW (ref >60)
GFR calc non Af Amer: 12 mL/min — ABNORMAL LOW (ref >60)
Glucose, Bld: 109 mg/dL — ABNORMAL HIGH (ref 70–99)
Potassium: 6.2 mmol/L — ABNORMAL HIGH (ref 3.5–5.1)
Sodium: 137 mmol/L (ref 135–145)

## 2017-10-01 LAB — BRAIN NATRIURETIC PEPTIDE: B Natriuretic Peptide: >4500 pg/mL — ABNORMAL HIGH (ref 0.0–100.0)

## 2017-10-01 LAB — CBC
HCT: 36.8 % (ref 36.0–46.0)
Hemoglobin: 11.1 g/dL — ABNORMAL LOW (ref 12.0–15.0)
MCH: 29.5 pg (ref 26.0–34.0)
MCHC: 30.2 g/dL (ref 30.0–36.0)
MCV: 97.9 fL (ref 78.0–100.0)
Platelets: 244 10*3/uL (ref 150–400)
RBC: 3.76 MIL/uL — ABNORMAL LOW (ref 3.87–5.11)
RDW: 19.4 % — ABNORMAL HIGH (ref 11.5–15.5)
WBC: 7.4 10*3/uL (ref 4.0–10.5)

## 2017-10-01 LAB — DIGOXIN LEVEL: Digoxin Level: 1.2 ng/mL (ref 0.8–2.0)

## 2017-10-01 MED ORDER — TORSEMIDE 20 MG PO TABS: 60.0000 mg | ORAL_TABLET | Freq: Every day | ORAL | 11 refills | Status: DC

## 2017-10-01 MED ORDER — METOLAZONE 2.5 MG PO TABS: 2.5000 mg | ORAL_TABLET | Freq: Once | ORAL | 0 refills | Status: DC

## 2017-10-01 NOTE — Patient Instructions (Addendum)
Routine lab work today. Will notify you of abnormal results, otherwise no news is good news!  INCREASE torsemide to 60 mg (3 tabs) once daily every morning.  Take metolazone 2.5 mg tablet once today only.  Follow up 3 weeks.  _____________________________________________________________________________ Gina [name] Code: 1500  Take all medication as prescribed the day of your appointment. Bring all medications with you to your appointment.  Do the following things EVERYDAY: 1) Weigh yourself in the morning before breakfast. Write it down and keep it in a log. 2) Take your medicines as prescribed 3) Eat low salt foods-Limit salt (sodium) to 2000 mg per day.  4) Stay as active as you can everyday 5) Limit all fluids for the day to less than 2 liters

## 2017-10-01 NOTE — Progress Notes (Signed)
PCP: Cardiologisit: Dr Harl Bowie  Primary HF Cardiologist:Dr Aundra Dubin   HPI:  Gina [name] is a 77 y.o. female with h/o chronic systolic CHF, anemia, HTN, LBBB, renal artery stenosis, and OSA.   Admitted 09/02/2017 with peripheral edema and worsening SOB. Attempts at diuresis were difficult due to AKI. Transferred to [facility] 09/10/17 in setting of cardiogenic shock 2/2 Afib RVR.  She underwent DCCV 7/11 and 7/14. Discussed with EP after she went back into Afib a third time. Underwent DCCV 7/18 and stayed in for NSR most of the day, but then went back into Afib. Underwent AV nodal ablation and PPM implantation 09/19/17 with Dr. Caryl Comes. Unable to place an LV lead. She was discharged to SNF.   Today she returns for post HF follow up from SNF on a stretcher. Currently at [facility]. Overall feeling ok. Denies SOB/PND/Orthopnea. Appetite ok. No fever or chills. She has not been able to stand to weigh. Says she has had couple of weight in wheelchair but she is unsure what they were.  Able to a few steps but has been working wit PT/OT. All medications have been provided by Rehab. Taking all medications.    ECHO 09/09/2017  Left ventricle: The cavity size was mildly dilated. Wall   thickness was increased in a pattern of moderate LVH. Systolic   function was severely reduced. The estimated ejection fraction   was = 20%. Diffuse hypokinesis. The study was not technically   sufficient to allow evaluation of LV diastolic dysfunction due to   atrial fibrillation. - Aortic valve: Mildly calcified annulus. Trileaflet; normal   thickness leaflets. Valve area (VTI): 2.39 cm^2. Valve area   (Vmax): 2.18 cm^2. - Mitral valve: There was mild regurgitation. - Left atrium: The atrium was severely dilated. - Right ventricle: The cavity size was mildly dilated. Systolic   function was mildly to moderately reduced. - Right atrium: The atrium was moderately dilated. - Atrial septum: No defect or patent foramen ovale  was identified. - Pulmonary arteries: Systolic pressure was mildly increased. PA   peak pressure: 37 mm Hg (S).  LHC 2017 Norm Coronaries.   ROS: All systems negative except as listed in HPI, PMH and Problem List.  SH:  Social History   Socioeconomic History  . Marital status: Married    Spouse name: Not on file  . Number of children: 0  . Years of education: Not on file  . Highest education level: Not on file  Occupational History  . Not on file  Social Needs  . Financial resource strain: Not on file  . Food insecurity:    Worry: Not on file    Inability: Not on file  . Transportation needs:    Medical: Not on file    Non-medical: Not on file  Tobacco Use  . Smoking status: Former Smoker    Packs/day: 1.00    Years: 24.00    Pack years: 24.00    Types: Cigarettes    Start date: 10/29/1958    Last attempt to quit: 12/02/1985    Years since quitting: 31.8  . Smokeless tobacco: Never Used  Substance and Sexual Activity  . Alcohol use: No    Alcohol/week: 0.0 oz  . Drug use: Not on file  . Sexual activity: Not on file  Lifestyle  . Physical activity:    Days per week: Not on file    Minutes per session: Not on file  . Stress: Not on file  Relationships  .  Social connections:    Talks on phone: Not on file    Gets together: Not on file    Attends religious service: Not on file    Active member of club or organization: Not on file    Attends meetings of clubs or organizations: Not on file    Relationship status: Not on file  . Intimate partner violence:    Fear of current or ex partner: Not on file    Emotionally abused: Not on file    Physically abused: Not on file    Forced sexual activity: Not on file  Other Topics Concern  . Not on file  Social History Narrative  . Not on file    FH:  Family History  Problem Relation Age of Onset  . Rheumatic fever Mother        in her early 73's  . Alzheimer's disease Mother   . Stomach cancer Father 69        died 15  . Bradycardia Brother        has pacemaker  . Heart failure Paternal Grandmother   . Heart failure Paternal Grandfather     Past Medical History:  Diagnosis Date  . Acute renal failure [facility])     hospitalized... December 08, 2009... improved with hydration in the hospital  . Anemia    further workup needed... October, 2011.  [name] Aortic insufficiency    mild...echo... June 2 010  . Arthritis    Hips/Knees, severe, requiring a walker  . Asymmetric septal hypertrophy (HCC)    echo....04/2009....patient encouraged the family to be screened elsewhere  . Atrial fibrillation ([place])     ??? atrial fibrillation during hospitalization October, 2011 ???..  . Bradycardia   . CAD (coronary artery disease)    Mild coronary disease, catheterization, 2009  . Cardiomyopathy, nonischemic ([place])    EF improved October, 2011  . Ejection fraction    EF 25%...nondiagnostic MRI December 2009...  /  echo June, 2010  /   echo... December 08, 2009.... ejection fraction improved... EF 60% /  EF 30%... echo.... [place]... December 28, 2009 with CHFPLanned ICD / CRT... patient has seen Dr.Klein..EF improved October, 2011   . Groin hematoma    Right-hematoma/abscess..repaired.. December, 2009  . Hypertension   . LBBB (left bundle branch block)   . LVH (left ventricular hypertrophy)    moderately severe... echo.. June, 2010  /  EF improved October, 2011.... cancel plans for ICD  . Mitral regurgitation    mild/ moderate...echo June, 2010  /   echo.. October, 2011.... mitral regurgitation  improved  . OSA (obstructive sleep apnea)   . Renal artery stenosis (HCC)    80% upper left   . Systolic heart failure    chronic    Current Outpatient Medications  Medication Sig Dispense Refill  . acetaminophen (TYLENOL) 325 MG tablet Take 2 tablets (650 mg total) by mouth every 4 (four) hours as needed for headache or mild pain.    [name] apixaban (ELIQUIS) 5 MG TABS tablet Take 1 tablet (5 mg total) by mouth 2  (two) times daily. 60 tablet 3  . bisacodyl (DULCOLAX) 5 MG EC tablet Take 2 tablets (10 mg total) by mouth daily as needed for moderate constipation. 30 tablet 0  . calcitRIOL (ROCALTROL) 0.25 MCG capsule Take 0.25 mcg by mouth daily.    . digoxin 62.5 MCG TABS Take 0.0625 mg by mouth daily. 30 tablet 6  . ferrous sulfate 325 (65 FE) MG  tablet Take 325 mg by mouth at bedtime.     . Multiple Vitamin (MULTIVITAMIN) tablet Take 1 tablet by mouth daily.    . Multiple Vitamins-Minerals (ICAPS PO) Take 1 capsule by mouth daily.    . potassium chloride SA (K-DUR,KLOR-CON) 20 MEQ tablet Take 2 tablets (40 mEq total) by mouth daily. 60 tablet 6  . spironolactone (ALDACTONE) 25 MG tablet Take 1 tablet (25 mg total) by mouth daily. 30 tablet 6  . torsemide (DEMADEX) 20 MG tablet Take 40 mg (2 tablets) daily, alternating with 60 mg (3 tablets) daily. 60 tablet 6   No current facility-administered medications for this encounter.     Vitals:   10/01/17 0922  BP: 118/78  Pulse: 90  SpO2: 97%  NO weight today. She was unable to wight.   PHYSICAL EXAM:  General:  Arrived via ambulance on a strethcer  No resp difficulty HEENT: normal Neck: supple. JVP 11-12 . Carotids 2+ bilaterally; no bruits. No lymphadenopathy or thryomegaly appreciated. Cor: PMI normal. Regular rate & rhythm. No rubs, gallops or murmurs. Left upper chest steri strips intact. I removed steri strips. Incision approximated with small amount of bloody exudate. No hematoma.  Lungs: clear Abdomen: soft, nontender, nondistended. No hepatosplenomegaly. No bruits or masses. Good bowel sounds. Extremities: Warm , no cyanosis, clubbing, rash, R and LLE 2-3+ edema Neuro: alert & orientedx3, cranial nerves grossly intact. Moves all 4 extremities w/o difficulty. Affect pleasant.   ECG: V Paced 90 bpm QRS 200 ms    ASSESSMENT & PLAN: 1. Chronic Systolic CHF: Echo (1/19) with EF 20%. Long-standing cardiomyopathy. Nonischemic based on cath  in 2017. Recent hospitalization she hadlow output HF and was on milrinone and later weaned off.  7/19, she had AV nodal ablation with PPM but unable to place LV lead. Hard to fully assess functional status given deconditioning. NYHA IIIb.  Volume status elevated. Increase torsemide to 60 mg daily. She will also take 2.5 mg metolazone today.  Add compression socks.   No BB for now. - Continue lower dose of digoxin 0.0625 daily.  Check Dig level today.   -No Arb for now with CKDI.  - Continue spironolactone 25 mg daily.   - Plan to re-attempt LV lead as an outpatient. She will follow up with Dr Caryl Comes . Hopefully renal function will improve over the next couple of months.  - Instructed to follow low salt diet and limit fluid intake to < 2 liters per day.  2.CKD stage 3:  No NSAID use.   Most recent Creatinine 2.31.  Check BMET today.  3. Persistent Atrial fibrillation: Admitted with afib/RVR, had been in afib for about 6 wks. TSH normal.  Had been on Eliquis for about 4 weeks without missinganydoses. Cardioverted to NSR 7/11 but back in atrial fibrillation with RVR by 7/12. Repeat DCCV to NSR in 7/14 after additional amiodarone loading but back in atrial fibrillation with RVR evening of 7/16.  DCCV again 7/18 but back in afib by am 7/19.  She clearly worsens when she goes back into afib with RVR.  She is now s/p AV nodal ablation with PPM.  No bleeding problems .  - Continue Eliquis 5 mg twice a day.   -Due to ambulance transport on a stretcher I personally called EP Chanetta Marshall NP and we discussed. I removed steri strips. Incision approximated. Small amount of blood exudate noted. Place dry 4x4. St Jude interrogated and will follow up with EP. Device reprogrammed.  4. Severe  Deconditioning Continue PT/OT at Rehab.  5. OSA Continue CPAP  Check BMET, CBC, dig level, and BNP today. I would like her to get daily weights at Rehab.   Follow up with HF in 3 weeks.  Follow up with EP next  month.   Lorna Strother NP-C  9:29 AM

## 2017-10-02 ENCOUNTER — Ambulatory Visit: Payer: Medicare Other

## 2017-10-02 DIAGNOSIS — N183 Chronic kidney disease, stage 3 (moderate): Secondary | ICD-10-CM | POA: Diagnosis not present

## 2017-10-02 DIAGNOSIS — I5022 Chronic systolic (congestive) heart failure: Secondary | ICD-10-CM | POA: Diagnosis not present

## 2017-10-02 DIAGNOSIS — R609 Edema, unspecified: Secondary | ICD-10-CM | POA: Diagnosis not present

## 2017-10-06 DIAGNOSIS — I5023 Acute on chronic systolic (congestive) heart failure: Secondary | ICD-10-CM | POA: Diagnosis not present

## 2017-10-06 DIAGNOSIS — N183 Chronic kidney disease, stage 3 (moderate): Secondary | ICD-10-CM | POA: Diagnosis not present

## 2017-10-06 DIAGNOSIS — M6281 Muscle weakness (generalized): Secondary | ICD-10-CM | POA: Diagnosis not present

## 2017-10-06 DIAGNOSIS — R609 Edema, unspecified: Secondary | ICD-10-CM | POA: Diagnosis not present

## 2017-10-07 DIAGNOSIS — M79674 Pain in right toe(s): Secondary | ICD-10-CM | POA: Diagnosis not present

## 2017-10-07 DIAGNOSIS — R2689 Other abnormalities of gait and mobility: Secondary | ICD-10-CM | POA: Diagnosis not present

## 2017-10-07 DIAGNOSIS — B351 Tinea unguium: Secondary | ICD-10-CM | POA: Diagnosis not present

## 2017-10-07 DIAGNOSIS — B9620 Unspecified Escherichia coli [E. coli] as the cause of diseases classified elsewhere: Secondary | ICD-10-CM | POA: Diagnosis not present

## 2017-10-07 DIAGNOSIS — M170 Bilateral primary osteoarthritis of knee: Secondary | ICD-10-CM | POA: Diagnosis not present

## 2017-10-07 DIAGNOSIS — M79675 Pain in left toe(s): Secondary | ICD-10-CM | POA: Diagnosis not present

## 2017-10-10 ENCOUNTER — Other Ambulatory Visit (HOSPITAL_COMMUNITY): Payer: Self-pay | Admitting: Cardiology

## 2017-10-10 DIAGNOSIS — N183 Chronic kidney disease, stage 3 (moderate): Secondary | ICD-10-CM | POA: Diagnosis not present

## 2017-10-10 DIAGNOSIS — I5023 Acute on chronic systolic (congestive) heart failure: Secondary | ICD-10-CM | POA: Diagnosis not present

## 2017-10-10 DIAGNOSIS — M6281 Muscle weakness (generalized): Secondary | ICD-10-CM | POA: Diagnosis not present

## 2017-10-10 DIAGNOSIS — R609 Edema, unspecified: Secondary | ICD-10-CM | POA: Diagnosis not present

## 2017-10-15 NOTE — Progress Notes (Signed)
PCP: Cardiologisit: Dr Harl Bowie  Primary HF Cardiologist:Dr Aundra Dubin   HPI:  Gina [name] is a 77 y.o. female with h/o chronic systolic CHF, anemia, HTN, LBBB, renal artery stenosis, and OSA.   Admitted 09/02/2017 with peripheral edema and worsening SOB. Attempts at diuresis were difficult due to AKI. Transferred to [facility] 09/10/17 in setting of cardiogenic shock 2/2 Afib RVR.  She underwent DCCV 7/11 and 7/14. Discussed with EP after she went back into Afib a third time. Underwent DCCV 7/18 and stayed in for NSR most of the day, but then went back into Afib. Underwent AV nodal ablation and PPM implantation 09/19/17 with Dr. Caryl Comes. Unable to place an LV lead. She was discharged to SNF.   Today she returns for HF follow up. Last visit torsemide was increased and she was given 2.5 mg metolazone x1. Creatinine was worse, so spiro and digoxin were stopped. Potassium was also stopped with hyperkalemia. St Jude PPM was reprogrammed last visit. In reviewing SNF med records, her spiro and dig were only stopped a few days ago. Overall doing better. She has been more SOB over the last 2 days. BLE is much improved, but she still has some. Denies orthopnea. She is sometimes SOB with just talking, but is not in any distress today. She has a productive cough that has been on and off for several months. No fever or chills. Sputum is white/gray. Working with PT/OT. Able to walk a few steps. Hopes to be discharged in 2 weeks and have Delia resumed. Denies bleeding on Eliquis. Wearing CPAP qHS. Meds and meals provided through SNF. Daughters bring in food sometimes. Tries to limit fluid and salt intake, but says salty foods are on her trays. Weights are not taken consistently, but weight is down 16 lbs on our scale.   ECHO 09/09/2017  Left ventricle: The cavity size was mildly dilated. Wall   thickness was increased in a pattern of moderate LVH. Systolic   function was severely reduced. The estimated ejection fraction   was =  20%. Diffuse hypokinesis. The study was not technically   sufficient to allow evaluation of LV diastolic dysfunction due to   atrial fibrillation. - Aortic valve: Mildly calcified annulus. Trileaflet; normal   thickness leaflets. Valve area (VTI): 2.39 cm^2. Valve area   (Vmax): 2.18 cm^2. - Mitral valve: There was mild regurgitation. - Left atrium: The atrium was severely dilated. - Right ventricle: The cavity size was mildly dilated. Systolic   function was mildly to moderately reduced. - Right atrium: The atrium was moderately dilated. - Atrial septum: No defect or patent foramen ovale was identified. - Pulmonary arteries: Systolic pressure was mildly increased. PA   peak pressure: 37 mm Hg (S).  LHC 2017 Norm Coronaries.   Review of systems complete and found to be negative unless listed in HPI.   SH:  Social History   Socioeconomic History  . Marital status: Married    Spouse name: Not on file  . Number of children: 0  . Years of education: Not on file  . Highest education level: Not on file  Occupational History  . Not on file  Social Needs  . Financial resource strain: Not on file  . Food insecurity:    Worry: Not on file    Inability: Not on file  . Transportation needs:    Medical: Not on file    Non-medical: Not on file  Tobacco Use  . Smoking status: Former Smoker    Packs/day:  1.00    Years: 24.00    Pack years: 24.00    Types: Cigarettes    Start date: 10/29/1958    Last attempt to quit: 12/02/1985    Years since quitting: 31.8  . Smokeless tobacco: Never Used  Substance and Sexual Activity  . Alcohol use: No    Alcohol/week: 0.0 standard drinks  . Drug use: Not on file  . Sexual activity: Not on file  Lifestyle  . Physical activity:    Days per week: Not on file    Minutes per session: Not on file  . Stress: Not on file  Relationships  . Social connections:    Talks on phone: Not on file    Gets together: Not on file    Attends religious  service: Not on file    Active member of club or organization: Not on file    Attends meetings of clubs or organizations: Not on file    Relationship status: Not on file  . Intimate partner violence:    Fear of current or ex partner: Not on file    Emotionally abused: Not on file    Physically abused: Not on file    Forced sexual activity: Not on file  Other Topics Concern  . Not on file  Social History Narrative  . Not on file    FH:  Family History  Problem Relation Age of Onset  . Rheumatic fever Mother        in her early 68's  . Alzheimer's disease Mother   . Stomach cancer Father 69       died 22  . Bradycardia Brother        has pacemaker  . Heart failure Paternal Grandmother   . Heart failure Paternal Grandfather     Past Medical History:  Diagnosis Date  . Acute renal failure [facility])     hospitalized... December 08, 2009... improved with hydration in the hospital  . Anemia    further workup needed... October, 2011.  [name] Aortic insufficiency    mild...echo... June 2 010  . Arthritis    Hips/Knees, severe, requiring a walker  . Asymmetric septal hypertrophy (HCC)    echo....04/2009....patient encouraged the family to be screened elsewhere  . Atrial fibrillation ([place])     ??? atrial fibrillation during hospitalization October, 2011 ???..  . Bradycardia   . CAD (coronary artery disease)    Mild coronary disease, catheterization, 2009  . Cardiomyopathy, nonischemic ([place])    EF improved October, 2011  . Ejection fraction    EF 25%...nondiagnostic MRI December 2009...  /  echo June, 2010  /   echo... December 08, 2009.... ejection fraction improved... EF 60% /  EF 30%... echo.... [place]... December 28, 2009 with CHFPLanned ICD / CRT... patient has seen Dr.Klein..EF improved October, 2011   . Groin hematoma    Right-hematoma/abscess..repaired.. December, 2009  . Hypertension   . LBBB (left bundle branch block)   . LVH (left ventricular hypertrophy)    moderately  severe... echo.. June, 2010  /  EF improved October, 2011.... cancel plans for ICD  . Mitral regurgitation    mild/ moderate...echo June, 2010  /   echo.. October, 2011.... mitral regurgitation  improved  . OSA (obstructive sleep apnea)   . Renal artery stenosis (HCC)    80% upper left   . Systolic heart failure    chronic    Current Outpatient Medications  Medication Sig Dispense Refill  . acetaminophen (TYLENOL) 325  MG tablet Take 2 tablets (650 mg total) by mouth every 4 (four) hours as needed for headache or mild pain.    [name] apixaban (ELIQUIS) 5 MG TABS tablet Take 1 tablet (5 mg total) by mouth 2 (two) times daily. 60 tablet 3  . bisacodyl (DULCOLAX) 5 MG EC tablet Take 2 tablets (10 mg total) by mouth daily as needed for moderate constipation. 30 tablet 0  . calcitRIOL (ROCALTROL) 0.25 MCG capsule Take 0.25 mcg by mouth daily.    . Multiple Vitamin (MULTIVITAMIN) tablet Take 1 tablet by mouth daily.    . Multiple Vitamins-Minerals (ICAPS PO) Take 1 capsule by mouth daily.    [name] torsemide (DEMADEX) 20 MG tablet Take 60 mg by mouth daily.    . traMADol (ULTRAM) 50 MG tablet Take 50 mg by mouth every 12 (twelve) hours as needed.     No current facility-administered medications for this encounter.     Vitals:   10/16/17 1002  BP: 126/88  Pulse: 80  SpO2: 97%  Weight: 97.4 kg (214 lb 12.8 oz)   Wt Readings from Last 3 Encounters:  10/16/17 97.4 kg (214 lb 12.8 oz)  09/23/17 105 kg (231 lb 7.7 oz)  08/15/17 102.5 kg (226 lb)    PHYSICAL EXAM:  General:  No resp difficulty. Arrived in wheelchair.  HEENT: Normal Neck: Supple. JVP 9-10. Carotids 2+ bilat; no bruits. No thyromegaly or nodule noted. Cor: PMI nondisplaced. RRR, No M/G/R noted Lungs: diminished in bases.  Abdomen: Soft, non-tender, non-distended, no HSM. No bruits or masses. +BS  Extremities: No cyanosis, clubbing, or rash. R and LLE 1+ edema Neuro: Alert & orientedx3, cranial nerves grossly intact. moves all 4  extremities w/o difficulty. Affect pleasant   ASSESSMENT & PLAN: 1. Chronic Systolic CHF: Echo (0/53) with EF 20%. Long-standing cardiomyopathy. Nonischemic based on cath in 2017. Recent hospitalization she hadlow output HF and was on milrinone and later weaned off.  7/19, she had AV nodal ablation with PPM but unable to place LV lead. Hard to fully assess functional status given deconditioning. NYHA IIIb.  - Volume status mildly elevated, but much improved. Weight is down 16 lbs. Increase torsemide to 40 mg BID x 2 days, then back to torsemide 40 mg am, 20 mg pm (pt requests to have it split up and given at 6 am and 2 pm).  - No BB for now. - No Arb for now with CKD.  Arlyce Harman and digoxin stopped with AKI on CKD. Check BMET today.  - Plan to re-attempt LV lead as an outpatient. Hopefully renal function will improve over the next couple of months. She sees Dr Caryl Comes in November. Sees EP today.  - Instructed to follow low salt diet and limit fluid intake to < 2 liters per day.  - Add TED hose  - Encouraged her to contact us if cough does not improve with extra diuresis. 2.CKD stage 3: - No NSAID use.   - Creatinine up to 3.47 and K 6.2 on 7/31. Dig and spiro stopped. Potassium now stopped. BMET today 3. Persistent Atrial fibrillation: Admitted with afib/RVR, had been in afib for about 6 wks. TSH normal.  Had been on Eliquis for about 4 weeks without missinganydoses. Cardioverted to NSR 7/11 but back in atrial fibrillation with RVR by 7/12. Repeat DCCV to NSR in 7/14 after additional amiodarone loading but back in atrial fibrillation with RVR evening of 7/16.  DCCV again 7/18 but back in afib by am 7/19.  She clearly worsens when she goes back into afib with RVR.  She is now s/p AV nodal ablation with St Jude PPM.  - Continue Eliquis 5 mg twice a day. Denies bleeding.  - Device reprogrammed last visit. She sees EP today at 11 am.  4. Severe Deconditioning - Continue PT/OT at Rehab. Hopes  to go home in 2 weeks. Will need diuretic protocol resumed with HH once home.  5. OSA - Continue CPAP qHS. Continue home O2.  BMET today Increase torsemide to 40 mg BID x 2 days, then back to 40 mg am, 20 mg pm (split up per pt request) Follow up 4 weeks  Add TED hose  Georgiana Shore, NP 10:09 AM  Greater than 50% of the 25 minute visit was spent in counseling/coordination of care regarding disease state education, salt/fluid restriction, sliding scale diuretics, and medication compliance.

## 2017-10-16 ENCOUNTER — Ambulatory Visit (HOSPITAL_COMMUNITY)
Admission: RE | Admit: 2017-10-16 | Discharge: 2017-10-16 | Disposition: A | Discharge status: Home / Self Care | Payer: No Typology Code available for payment source | Source: Ambulatory Visit | Attending: Internal Medicine | Admitting: Internal Medicine

## 2017-10-16 ENCOUNTER — Ambulatory Visit (INDEPENDENT_AMBULATORY_CARE_PROVIDER_SITE_OTHER): Payer: Medicare Other | Admitting: *Deleted

## 2017-10-16 VITALS — BP 126/88 | HR 80 | Wt 214.8 lb

## 2017-10-16 DIAGNOSIS — I482 Chronic atrial fibrillation: Secondary | ICD-10-CM | POA: Insufficient documentation

## 2017-10-16 DIAGNOSIS — I4819 Other persistent atrial fibrillation: Secondary | ICD-10-CM

## 2017-10-16 DIAGNOSIS — D649 Anemia, unspecified: Secondary | ICD-10-CM | POA: Diagnosis not present

## 2017-10-16 DIAGNOSIS — I130 Hypertensive heart and chronic kidney disease with heart failure and stage 1 through stage 4 chronic kidney disease, or unspecified chronic kidney disease: Secondary | ICD-10-CM | POA: Diagnosis not present

## 2017-10-16 DIAGNOSIS — I5022 Chronic systolic (congestive) heart failure: Secondary | ICD-10-CM | POA: Insufficient documentation

## 2017-10-16 DIAGNOSIS — I97190 Other postprocedural cardiac functional disturbances following cardiac surgery: Secondary | ICD-10-CM

## 2017-10-16 DIAGNOSIS — I442 Atrioventricular block, complete: Secondary | ICD-10-CM

## 2017-10-16 DIAGNOSIS — E875 Hyperkalemia: Secondary | ICD-10-CM | POA: Insufficient documentation

## 2017-10-16 DIAGNOSIS — Z79899 Other long term (current) drug therapy: Secondary | ICD-10-CM | POA: Insufficient documentation

## 2017-10-16 DIAGNOSIS — I429 Cardiomyopathy, unspecified: Secondary | ICD-10-CM | POA: Diagnosis not present

## 2017-10-16 DIAGNOSIS — I481 Persistent atrial fibrillation: Secondary | ICD-10-CM | POA: Diagnosis not present

## 2017-10-16 DIAGNOSIS — Z7901 Long term (current) use of anticoagulants: Secondary | ICD-10-CM | POA: Insufficient documentation

## 2017-10-16 DIAGNOSIS — Z87891 Personal history of nicotine dependence: Secondary | ICD-10-CM | POA: Insufficient documentation

## 2017-10-16 DIAGNOSIS — G4733 Obstructive sleep apnea (adult) (pediatric): Secondary | ICD-10-CM | POA: Diagnosis not present

## 2017-10-16 DIAGNOSIS — N183 Chronic kidney disease, stage 3 unspecified: Secondary | ICD-10-CM

## 2017-10-16 DIAGNOSIS — I5042 Chronic combined systolic (congestive) and diastolic (congestive) heart failure: Secondary | ICD-10-CM | POA: Diagnosis not present

## 2017-10-16 DIAGNOSIS — R5381 Other malaise: Secondary | ICD-10-CM | POA: Diagnosis not present

## 2017-10-16 DIAGNOSIS — I447 Left bundle-branch block, unspecified: Secondary | ICD-10-CM | POA: Insufficient documentation

## 2017-10-16 DIAGNOSIS — Z9981 Dependence on supplemental oxygen: Secondary | ICD-10-CM | POA: Insufficient documentation

## 2017-10-16 DIAGNOSIS — Z950 Presence of cardiac pacemaker: Secondary | ICD-10-CM | POA: Diagnosis not present

## 2017-10-16 DIAGNOSIS — I2510 Atherosclerotic heart disease of native coronary artery without angina pectoris: Secondary | ICD-10-CM | POA: Insufficient documentation

## 2017-10-16 LAB — CUP PACEART INCLINIC DEVICE CHECK
Battery Remaining Longevity: 73 mo
Battery Voltage: 3.01 V
Brady Statistic RA Percent Paced: 0 %
Brady Statistic RV Percent Paced: 99.99 %
Date Time Interrogation Session: 20190815152057
Implantable Lead Implant Date: 20190719
Implantable Lead Implant Date: 20190719
Implantable Lead Location: 753859
Implantable Lead Location: 753860
Implantable Lead Model: 5076
Implantable Lead Model: 5076
Implantable Pulse Generator Implant Date: 20190719
Lead Channel Impedance Value: 575 Ohm
Lead Channel Pacing Threshold Amplitude: 0.5 V
Lead Channel Pacing Threshold Pulse Width: 0.5 ms
Lead Channel Setting Pacing Amplitude: 3.5 V
Lead Channel Setting Pacing Pulse Width: 0.5 ms
Lead Channel Setting Sensing Sensitivity: 4 mV
Pulse Gen Model: 3222
Pulse Gen Serial Number: 9022392

## 2017-10-16 LAB — BASIC METABOLIC PANEL
Anion gap: 14 (ref 5–15)
BUN: 125 mg/dL — ABNORMAL HIGH (ref 8–23)
CO2: 27 mmol/L (ref 22–32)
Calcium: 9.3 mg/dL (ref 8.9–10.3)
Chloride: 99 mmol/L (ref 98–111)
Creatinine, Ser: 3.11 mg/dL — ABNORMAL HIGH (ref 0.44–1.00)
GFR calc Af Amer: 16 mL/min — ABNORMAL LOW (ref >60)
GFR calc non Af Amer: 14 mL/min — ABNORMAL LOW (ref >60)
Glucose, Bld: 115 mg/dL — ABNORMAL HIGH (ref 70–99)
Potassium: 5.3 mmol/L — ABNORMAL HIGH (ref 3.5–5.1)
Sodium: 140 mmol/L (ref 135–145)

## 2017-10-16 NOTE — Progress Notes (Signed)
Wound check appointment. Steri-strips previously removed at HF Clinic appointment on 10/01/17 per notes. Wound without redness or edema. Incision edges approximated, wound healing well. Normal device function. Threshold, sensing, and impedance consistent with implant measurements. Device output reduced from 5.0V to 3.5V @ 0.25ms, still maintaining extra safety margin until 3 month visit. Histogram distribution appropriate for patient and level of activity. Base rate reduced from 80bpm to 70bpm (s/p AVN ablation on 09/19/17). Persistent AF, +Eliquis. No high ventricular rates noted. Patient and family educated about wound care, arm mobility, lifting restrictions, and Merlin monitor. ROV with SK on 01/16/18.

## 2017-10-16 NOTE — Patient Instructions (Signed)
Routine lab work today. Will notify you of abnormal results, otherwise no news is good news!  INCREASE Torsemide 40 mg twice daily for two days then 40 mg in am and 20 mg in pm every day thereafter.  Follow up 4-6 weeks.  ____________________________________________________________________________ Marin Roberts Code: 1028  Take all medication as prescribed the day of your appointment. Bring all medications with you to your appointment.  Do the following things EVERYDAY: 1) Weigh yourself in the morning before breakfast. Write it down and keep it in a log. 2) Take your medicines as prescribed 3) Eat low salt foods-Limit salt (sodium) to 2000 mg per day.  4) Stay as active as you can everyday 5) Limit all fluids for the day to less than 2 liters

## 2017-10-17 ENCOUNTER — Other Ambulatory Visit (HOSPITAL_COMMUNITY): Payer: Self-pay | Admitting: Cardiology

## 2017-10-17 DIAGNOSIS — B372 Candidiasis of skin and nail: Secondary | ICD-10-CM | POA: Diagnosis not present

## 2017-10-21 DIAGNOSIS — I10 Essential (primary) hypertension: Secondary | ICD-10-CM | POA: Diagnosis not present

## 2017-10-21 DIAGNOSIS — M6281 Muscle weakness (generalized): Secondary | ICD-10-CM | POA: Diagnosis not present

## 2017-10-21 DIAGNOSIS — I5023 Acute on chronic systolic (congestive) heart failure: Secondary | ICD-10-CM | POA: Diagnosis not present

## 2017-10-21 DIAGNOSIS — N183 Chronic kidney disease, stage 3 (moderate): Secondary | ICD-10-CM | POA: Diagnosis not present

## 2017-10-23 ENCOUNTER — Telehealth (HOSPITAL_COMMUNITY): Payer: Self-pay | Admitting: Cardiology

## 2017-10-23 MED ORDER — METOLAZONE 2.5 MG PO TABS: 2.5000 mg | ORAL_TABLET | Freq: Every day | ORAL | 3 refills | Status: DC

## 2017-10-23 MED ORDER — SPIRONOLACTONE 25 MG PO TABS: 25.0000 mg | ORAL_TABLET | Freq: Every day | ORAL | 3 refills | Status: DC

## 2017-10-23 NOTE — Telephone Encounter (Signed)
Notes recorded by Kerry Dory, CMA on 10/23/2017 at 3:46 PM EDT Spoke with Lovena Le at [facility], did receive order to d/c digoxin, potassium, and spiro and repeat labs early week of 8/19-8/23, however patient seen by Dr Wynetta Emery (nephrology) on 8/20 and patient was restarted on spiro 25 daily, torsemide increased to 60 mg daily, and metolazone 2.5 mg daily. Labs were done on 8/20, requested copy.  Medication list updated and message to provider as Juluis Rainier ------  Notes recorded by Kerry Dory, CMA on 10/20/2017 at 4:07 PM EDT Left message for patient to call back.[phone] (H)  ------  Notes recorded by Georgiana Shore, NP on 10/16/2017 at 12:36 PM EDT Potassium and creatinine still elevated. Please call SNF and make sure she is not receiving any digoxin, potassium, or spironolactone. It looked like they stopped them a couple days ago when I looked at her list from SNF today. She needs a repeat BMET early next week. Okay to do at facility and have them fax to [place]. Thanks

## 2017-10-29 ENCOUNTER — Other Ambulatory Visit (HOSPITAL_COMMUNITY): Payer: Self-pay | Admitting: Cardiology

## 2017-10-30 DIAGNOSIS — M109 Gout, unspecified: Secondary | ICD-10-CM | POA: Diagnosis not present

## 2017-11-03 DIAGNOSIS — N183 Chronic kidney disease, stage 3 (moderate): Secondary | ICD-10-CM | POA: Diagnosis not present

## 2017-11-03 DIAGNOSIS — M109 Gout, unspecified: Secondary | ICD-10-CM | POA: Diagnosis not present

## 2017-11-06 DIAGNOSIS — M109 Gout, unspecified: Secondary | ICD-10-CM | POA: Diagnosis not present

## 2017-11-13 ENCOUNTER — Ambulatory Visit (HOSPITAL_COMMUNITY)
Admission: RE | Admit: 2017-11-13 | Discharge: 2017-11-13 | Disposition: A | Discharge status: Home / Self Care | Payer: No Typology Code available for payment source | Source: Ambulatory Visit | Attending: Cardiology | Admitting: Cardiology

## 2017-11-13 ENCOUNTER — Encounter (HOSPITAL_COMMUNITY): Payer: Self-pay

## 2017-11-13 VITALS — BP 112/84 | HR 76 | Wt 190.2 lb

## 2017-11-13 DIAGNOSIS — N183 Chronic kidney disease, stage 3 unspecified: Secondary | ICD-10-CM

## 2017-11-13 DIAGNOSIS — I481 Persistent atrial fibrillation: Secondary | ICD-10-CM | POA: Diagnosis not present

## 2017-11-13 DIAGNOSIS — G4733 Obstructive sleep apnea (adult) (pediatric): Secondary | ICD-10-CM | POA: Insufficient documentation

## 2017-11-13 DIAGNOSIS — I5022 Chronic systolic (congestive) heart failure: Secondary | ICD-10-CM | POA: Diagnosis not present

## 2017-11-13 DIAGNOSIS — R5381 Other malaise: Secondary | ICD-10-CM

## 2017-11-13 DIAGNOSIS — Z8249 Family history of ischemic heart disease and other diseases of the circulatory system: Secondary | ICD-10-CM | POA: Insufficient documentation

## 2017-11-13 DIAGNOSIS — I130 Hypertensive heart and chronic kidney disease with heart failure and stage 1 through stage 4 chronic kidney disease, or unspecified chronic kidney disease: Secondary | ICD-10-CM | POA: Insufficient documentation

## 2017-11-13 DIAGNOSIS — I4819 Other persistent atrial fibrillation: Secondary | ICD-10-CM

## 2017-11-13 DIAGNOSIS — I5042 Chronic combined systolic (congestive) and diastolic (congestive) heart failure: Secondary | ICD-10-CM

## 2017-11-13 DIAGNOSIS — Z79899 Other long term (current) drug therapy: Secondary | ICD-10-CM | POA: Diagnosis not present

## 2017-11-13 DIAGNOSIS — I2510 Atherosclerotic heart disease of native coronary artery without angina pectoris: Secondary | ICD-10-CM | POA: Insufficient documentation

## 2017-11-13 DIAGNOSIS — I701 Atherosclerosis of renal artery: Secondary | ICD-10-CM | POA: Insufficient documentation

## 2017-11-13 DIAGNOSIS — Z87891 Personal history of nicotine dependence: Secondary | ICD-10-CM | POA: Insufficient documentation

## 2017-11-13 MED ORDER — METOLAZONE 2.5 MG PO TABS: 2.5000 mg | ORAL_TABLET | ORAL | 11 refills | Status: DC

## 2017-11-13 NOTE — Progress Notes (Signed)
  VEST/CLIP READING = 49 SITTING, ALIGNED, STATION B, RULER 31.5

## 2017-11-13 NOTE — Progress Notes (Signed)
PCP: Dr Woody Seller  Cardiologisit: Dr Harl Bowie  Primary HF Cardiologist:Dr Aundra Dubin  Neprology: Dr Hinda Lenis  HPI: Gina [name] is a 77 y.o. female with h/o chronic systolic CHF, anemia, HTN, LBBB, renal artery stenosis, and OSA.   Admitted 09/02/2017 with peripheral edema and worsening SOB. Attempts at diuresis were difficult due to AKI. Transferred to [facility] 09/10/17 in setting of cardiogenic shock 2/2 Afib RVR.  She underwent DCCV 7/11 and 7/14. Discussed with EP after she went back into Afib a third time. Underwent DCCV 7/18 and stayed in for NSR most of the day, but then went back into Afib. Underwent AV nodal ablation and PPM implantation 09/19/17 with Dr. Caryl Comes. Unable to place an LV lead. She was discharged to SNF.   Today she returns for HF follow up. She remains at [facility] for ongoing rehab. Overall feeling fine. Says on most days she has been given 20 mg torsemide and 2.5 mg metolazone daily. Every now and then she was given 40 mg of torsemide. Denies PND/Orthopnea. Appetite ok. No fever or chills. Weight at SNF has gone down to 190 pounds. All medications provided at [facility]. Planning for d/c to home next week.    ECHO 09/09/2017  Left ventricle: The cavity size was mildly dilated. Wall   thickness was increased in a pattern of moderate LVH. Systolic   function was severely reduced. The estimated ejection fraction   was = 20%. Diffuse hypokinesis. The study was not technically   sufficient to allow evaluation of LV diastolic dysfunction due to   atrial fibrillation. - Aortic valve: Mildly calcified annulus. Trileaflet; normal   thickness leaflets. Valve area (VTI): 2.39 cm^2. Valve area   (Vmax): 2.18 cm^2. - Mitral valve: There was mild regurgitation. - Left atrium: The atrium was severely dilated. - Right ventricle: The cavity size was mildly dilated. Systolic   function was mildly to moderately reduced. - Right atrium: The atrium was moderately dilated. - Atrial septum: No defect or patent foramen  ovale was identified. - Pulmonary arteries: Systolic pressure was mildly increased. PA   peak pressure: 37 mm Hg (S).  LHC 2017 Norm Coronaries.   Review of systems complete and found to be negative unless listed in HPI.   SH:  Social History   Socioeconomic History  . Marital status: Married    Spouse name: Not on file  . Number of children: 0  . Years of education: Not on file  . Highest education level: Not on file  Occupational History  . Not on file  Social Needs  . Financial resource strain: Not on file  . Food insecurity:    Worry: Not on file    Inability: Not on file  . Transportation needs:    Medical: Not on file    Non-medical: Not on file  Tobacco Use  . Smoking status: Former Smoker    Packs/day: 1.00    Years: 24.00    Pack years: 24.00    Types: Cigarettes    Start date: 10/29/1958    Last attempt to quit: 12/02/1985    Years since quitting: 31.9  . Smokeless tobacco: Never Used  Substance and Sexual Activity  . Alcohol use: No    Alcohol/week: 0.0 standard drinks  . Drug use: Not on file  . Sexual activity: Not on file  Lifestyle  . Physical activity:    Days per week: Not on file    Minutes per session: Not on file  . Stress: Not on file  Relationships  . Social connections:    Talks on phone: Not on file    Gets together: Not on file    Attends religious service: Not on file    Active member of club or organization: Not on file    Attends meetings of clubs or organizations: Not on file    Relationship status: Not on file  . Intimate partner violence:    Fear of current or ex partner: Not on file    Emotionally abused: Not on file    Physically abused: Not on file    Forced sexual activity: Not on file  Other Topics Concern  . Not on file  Social History Narrative  . Not on file    FH:  Family History  Problem Relation Age of Onset  . Rheumatic fever Mother        in her early 31's  . Alzheimer's disease Mother   . Stomach  cancer Father 97       died 29  . Bradycardia Brother        has pacemaker  . Heart failure Paternal Grandmother   . Heart failure Paternal Grandfather     Past Medical History:  Diagnosis Date  . Acute renal failure [facility])     hospitalized... December 08, 2009... improved with hydration in the hospital  . Anemia    further workup needed... October, 2011.  [name] Aortic insufficiency    mild...echo... June 2 010  . Arthritis    Hips/Knees, severe, requiring a walker  . Asymmetric septal hypertrophy (HCC)    echo....04/2009....patient encouraged the family to be screened elsewhere  . Atrial fibrillation ([place])     ??? atrial fibrillation during hospitalization October, 2011 ???..  . Bradycardia   . CAD (coronary artery disease)    Mild coronary disease, catheterization, 2009  . Cardiomyopathy, nonischemic ([place])    EF improved October, 2011  . Ejection fraction    EF 25%...nondiagnostic MRI December 2009...  /  echo June, 2010  /   echo... December 08, 2009.... ejection fraction improved... EF 60% /  EF 30%... echo.... [place]... December 28, 2009 with CHFPLanned ICD / CRT... patient has seen Dr.Klein..EF improved October, 2011   . Groin hematoma    Right-hematoma/abscess..repaired.. December, 2009  . Hypertension   . LBBB (left bundle branch block)   . LVH (left ventricular hypertrophy)    moderately severe... echo.. June, 2010  /  EF improved October, 2011.... cancel plans for ICD  . Mitral regurgitation    mild/ moderate...echo June, 2010  /   echo.. October, 2011.... mitral regurgitation  improved  . OSA (obstructive sleep apnea)   . Renal artery stenosis (HCC)    80% upper left   . Systolic heart failure    chronic    Current Outpatient Medications  Medication Sig Dispense Refill  . acetaminophen (TYLENOL) 325 MG tablet Take 2 tablets (650 mg total) by mouth every 4 (four) hours as needed for headache or mild pain.    [name] apixaban (ELIQUIS) 5 MG TABS tablet Take 1 tablet (5  mg total) by mouth 2 (two) times daily. 60 tablet 3  . bisacodyl (DULCOLAX) 5 MG EC tablet Take 2 tablets (10 mg total) by mouth daily as needed for moderate constipation. 30 tablet 0  . calcitRIOL (ROCALTROL) 0.25 MCG capsule Take 0.25 mcg by mouth daily.    . metolazone (ZAROXOLYN) 2.5 MG tablet Take 1 tablet (2.5 mg total) by mouth daily. 90 tablet 3  .  Multiple Vitamin (MULTIVITAMIN) tablet Take 1 tablet by mouth daily.    . Multiple Vitamins-Minerals (ICAPS PO) Take 1 capsule by mouth daily.    [name] spironolactone (ALDACTONE) 25 MG tablet Take 1 tablet (25 mg total) by mouth daily. 90 tablet 3  . torsemide (DEMADEX) 20 MG tablet Take 40 mg by mouth daily.    . traMADol (ULTRAM) 50 MG tablet Take 50 mg by mouth every 12 (twelve) hours as needed.     No current facility-administered medications for this encounter.     Vitals:   11/13/17 1120  BP: 112/84  Pulse: 76  SpO2: 96%  Weight: 86.3 kg (190 lb 3.2 oz)   Wt Readings from Last 3 Encounters:  11/13/17 86.3 kg (190 lb 3.2 oz)  10/16/17 97.4 kg (214 lb 12.8 oz)  09/23/17 105 kg (231 lb 7.7 oz)   Reds Clip reading --> 49%  PHYSICAL EXAM: General:   No resp difficulty. Arrived in a wheel chair.  HEENT: normal Neck: supple. JVP 5-6 . Carotids 2+ bilat; no bruits. No lymphadenopathy or thryomegaly appreciated. Cor: PMI nondisplaced. Regular rate & rhythm. No rubs, gallops or murmurs. Lungs: clear Abdomen: soft, nontender, nondistended. No hepatosplenomegaly. No bruits or masses. Good bowel sounds. Extremities: no cyanosis, clubbing, rash, edema Neuro: alert & orientedx3, cranial nerves grossly intact. moves all 4 extremities w/o difficulty. Affect pleasant  ASSESSMENT & PLAN: 1. Chronic Systolic CHF: Echo (0/81) with EF 20%. Long-standing cardiomyopathy. Nonischemic based on cath in 2017. Recent hospitalization she hadlow output HF and was on milrinone and later weaned off.  7/19, she had AV nodal ablation with PPM but unable  to place LV lead. NYHA II-III. Functional improvement.  - Volume status stable despite Reds Clip reading 49%. Physical exam does not support volume overload.  Stop daily metolazone and change to 2.5 mg metolazone every Wednesday.  -Continue 40 mg torsemide daily.  -I reviewed recent creatinine and it has been trending down from 2.7>1.9.  - No BB for now. - No Arb for now with CKD.  Arlyce Harman and digoxin stopped with AKI on CKD. Arlyce Harman was restarted per nephrology.   - Plan to re-attempt LV lead as an outpatient. Hopefully renal function will improve over the next couple of months. She sees Dr Caryl Comes in November.  2.CKD stage 3: - No NSAID use.   - Creatinine up to 3.47 and K 6.2 on 7/31. Dig and spiro stopped. -Creatinine trending down from 2.7>1.9.  Nephrology placed back spiro 25 mg daily.  3. Persistent Atrial fibrillation: Admitted with afib/RVR, had been in afib for about 6 wks. TSH normal.  Had been on Eliquis for about 4 weeks without missinganydoses. Cardioverted to NSR 7/11 but back in atrial fibrillation with RVR by 7/12. Repeat DCCV to NSR in 7/14 after additional amiodarone loading but back in atrial fibrillation with RVR evening of 7/16.  DCCV again 7/18 but back in afib by am 7/19.  She clearly worsens when she goes back into afib with RVR.  She is now s/p AV nodal ablation with St Jude PPM.  - Continue Eliquis 5 mg twice a day.  - No bleeding problems.  4. Severe Deconditioning Continue therapy at SNF.  - 5. OSA - Continue CPAP qHS.  Follow up in 3 weeks with Dr Aundra Dubin.    Darrick Grinder, NP 11:25 AM

## 2017-11-18 DIAGNOSIS — N183 Chronic kidney disease, stage 3 (moderate): Secondary | ICD-10-CM | POA: Diagnosis not present

## 2017-11-18 DIAGNOSIS — I5023 Acute on chronic systolic (congestive) heart failure: Secondary | ICD-10-CM | POA: Diagnosis not present

## 2017-11-18 DIAGNOSIS — M6281 Muscle weakness (generalized): Secondary | ICD-10-CM | POA: Diagnosis not present

## 2017-11-18 DIAGNOSIS — I10 Essential (primary) hypertension: Secondary | ICD-10-CM | POA: Diagnosis not present

## 2017-11-24 DIAGNOSIS — I2510 Atherosclerotic heart disease of native coronary artery without angina pectoris: Secondary | ICD-10-CM | POA: Diagnosis not present

## 2017-11-24 DIAGNOSIS — N183 Chronic kidney disease, stage 3 (moderate): Secondary | ICD-10-CM | POA: Diagnosis not present

## 2017-11-24 DIAGNOSIS — I5022 Chronic systolic (congestive) heart failure: Secondary | ICD-10-CM | POA: Diagnosis not present

## 2017-11-24 DIAGNOSIS — I447 Left bundle-branch block, unspecified: Secondary | ICD-10-CM | POA: Diagnosis not present

## 2017-11-24 DIAGNOSIS — I4891 Unspecified atrial fibrillation: Secondary | ICD-10-CM | POA: Diagnosis not present

## 2017-11-24 DIAGNOSIS — D631 Anemia in chronic kidney disease: Secondary | ICD-10-CM | POA: Diagnosis not present

## 2017-11-24 DIAGNOSIS — G4733 Obstructive sleep apnea (adult) (pediatric): Secondary | ICD-10-CM | POA: Diagnosis not present

## 2017-11-24 DIAGNOSIS — E669 Obesity, unspecified: Secondary | ICD-10-CM | POA: Diagnosis not present

## 2017-11-24 DIAGNOSIS — M1A39X0 Chronic gout due to renal impairment, multiple sites, without tophus (tophi): Secondary | ICD-10-CM | POA: Diagnosis not present

## 2017-11-24 DIAGNOSIS — Z950 Presence of cardiac pacemaker: Secondary | ICD-10-CM | POA: Diagnosis not present

## 2017-11-24 DIAGNOSIS — I130 Hypertensive heart and chronic kidney disease with heart failure and stage 1 through stage 4 chronic kidney disease, or unspecified chronic kidney disease: Secondary | ICD-10-CM | POA: Diagnosis not present

## 2017-11-24 DIAGNOSIS — I701 Atherosclerosis of renal artery: Secondary | ICD-10-CM | POA: Diagnosis not present

## 2017-11-24 DIAGNOSIS — M170 Bilateral primary osteoarthritis of knee: Secondary | ICD-10-CM | POA: Diagnosis not present

## 2017-11-25 DIAGNOSIS — D631 Anemia in chronic kidney disease: Secondary | ICD-10-CM | POA: Diagnosis not present

## 2017-11-25 DIAGNOSIS — I130 Hypertensive heart and chronic kidney disease with heart failure and stage 1 through stage 4 chronic kidney disease, or unspecified chronic kidney disease: Secondary | ICD-10-CM | POA: Diagnosis not present

## 2017-11-25 DIAGNOSIS — I5022 Chronic systolic (congestive) heart failure: Secondary | ICD-10-CM | POA: Diagnosis not present

## 2017-11-25 DIAGNOSIS — I2510 Atherosclerotic heart disease of native coronary artery without angina pectoris: Secondary | ICD-10-CM | POA: Diagnosis not present

## 2017-11-25 DIAGNOSIS — I701 Atherosclerosis of renal artery: Secondary | ICD-10-CM | POA: Diagnosis not present

## 2017-11-25 DIAGNOSIS — N183 Chronic kidney disease, stage 3 (moderate): Secondary | ICD-10-CM | POA: Diagnosis not present

## 2017-11-27 DIAGNOSIS — I701 Atherosclerosis of renal artery: Secondary | ICD-10-CM | POA: Diagnosis not present

## 2017-11-27 DIAGNOSIS — I2510 Atherosclerotic heart disease of native coronary artery without angina pectoris: Secondary | ICD-10-CM | POA: Diagnosis not present

## 2017-11-27 DIAGNOSIS — I130 Hypertensive heart and chronic kidney disease with heart failure and stage 1 through stage 4 chronic kidney disease, or unspecified chronic kidney disease: Secondary | ICD-10-CM | POA: Diagnosis not present

## 2017-11-27 DIAGNOSIS — N183 Chronic kidney disease, stage 3 (moderate): Secondary | ICD-10-CM | POA: Diagnosis not present

## 2017-11-27 DIAGNOSIS — D631 Anemia in chronic kidney disease: Secondary | ICD-10-CM | POA: Diagnosis not present

## 2017-11-27 DIAGNOSIS — I5022 Chronic systolic (congestive) heart failure: Secondary | ICD-10-CM | POA: Diagnosis not present

## 2017-11-28 DIAGNOSIS — N183 Chronic kidney disease, stage 3 (moderate): Secondary | ICD-10-CM | POA: Diagnosis not present

## 2017-11-28 DIAGNOSIS — D631 Anemia in chronic kidney disease: Secondary | ICD-10-CM | POA: Diagnosis not present

## 2017-11-28 DIAGNOSIS — I2510 Atherosclerotic heart disease of native coronary artery without angina pectoris: Secondary | ICD-10-CM | POA: Diagnosis not present

## 2017-11-28 DIAGNOSIS — I130 Hypertensive heart and chronic kidney disease with heart failure and stage 1 through stage 4 chronic kidney disease, or unspecified chronic kidney disease: Secondary | ICD-10-CM | POA: Diagnosis not present

## 2017-11-28 DIAGNOSIS — I5022 Chronic systolic (congestive) heart failure: Secondary | ICD-10-CM | POA: Diagnosis not present

## 2017-11-28 DIAGNOSIS — I701 Atherosclerosis of renal artery: Secondary | ICD-10-CM | POA: Diagnosis not present

## 2017-12-01 DIAGNOSIS — D631 Anemia in chronic kidney disease: Secondary | ICD-10-CM | POA: Diagnosis not present

## 2017-12-01 DIAGNOSIS — I130 Hypertensive heart and chronic kidney disease with heart failure and stage 1 through stage 4 chronic kidney disease, or unspecified chronic kidney disease: Secondary | ICD-10-CM | POA: Diagnosis not present

## 2017-12-01 DIAGNOSIS — I2510 Atherosclerotic heart disease of native coronary artery without angina pectoris: Secondary | ICD-10-CM | POA: Diagnosis not present

## 2017-12-01 DIAGNOSIS — I701 Atherosclerosis of renal artery: Secondary | ICD-10-CM | POA: Diagnosis not present

## 2017-12-01 DIAGNOSIS — I5022 Chronic systolic (congestive) heart failure: Secondary | ICD-10-CM | POA: Diagnosis not present

## 2017-12-01 DIAGNOSIS — N183 Chronic kidney disease, stage 3 (moderate): Secondary | ICD-10-CM | POA: Diagnosis not present

## 2017-12-02 DIAGNOSIS — N183 Chronic kidney disease, stage 3 (moderate): Secondary | ICD-10-CM | POA: Diagnosis not present

## 2017-12-02 DIAGNOSIS — I130 Hypertensive heart and chronic kidney disease with heart failure and stage 1 through stage 4 chronic kidney disease, or unspecified chronic kidney disease: Secondary | ICD-10-CM | POA: Diagnosis not present

## 2017-12-02 DIAGNOSIS — D631 Anemia in chronic kidney disease: Secondary | ICD-10-CM | POA: Diagnosis not present

## 2017-12-02 DIAGNOSIS — I2510 Atherosclerotic heart disease of native coronary artery without angina pectoris: Secondary | ICD-10-CM | POA: Diagnosis not present

## 2017-12-02 DIAGNOSIS — I5022 Chronic systolic (congestive) heart failure: Secondary | ICD-10-CM | POA: Diagnosis not present

## 2017-12-02 DIAGNOSIS — I701 Atherosclerosis of renal artery: Secondary | ICD-10-CM | POA: Diagnosis not present

## 2017-12-03 DIAGNOSIS — I701 Atherosclerosis of renal artery: Secondary | ICD-10-CM | POA: Diagnosis not present

## 2017-12-03 DIAGNOSIS — I2510 Atherosclerotic heart disease of native coronary artery without angina pectoris: Secondary | ICD-10-CM | POA: Diagnosis not present

## 2017-12-03 DIAGNOSIS — I5022 Chronic systolic (congestive) heart failure: Secondary | ICD-10-CM | POA: Diagnosis not present

## 2017-12-03 DIAGNOSIS — I130 Hypertensive heart and chronic kidney disease with heart failure and stage 1 through stage 4 chronic kidney disease, or unspecified chronic kidney disease: Secondary | ICD-10-CM | POA: Diagnosis not present

## 2017-12-03 DIAGNOSIS — D631 Anemia in chronic kidney disease: Secondary | ICD-10-CM | POA: Diagnosis not present

## 2017-12-03 DIAGNOSIS — N183 Chronic kidney disease, stage 3 (moderate): Secondary | ICD-10-CM | POA: Diagnosis not present

## 2017-12-04 ENCOUNTER — Ambulatory Visit (HOSPITAL_COMMUNITY)
Admission: RE | Admit: 2017-12-04 | Discharge: 2017-12-04 | Disposition: A | Discharge status: Home / Self Care | Payer: Medicare Other | Source: Ambulatory Visit | Attending: Cardiology | Admitting: Cardiology

## 2017-12-04 ENCOUNTER — Other Ambulatory Visit: Payer: Self-pay | Admitting: Internal Medicine

## 2017-12-04 VITALS — BP 116/65 | HR 72 | Wt 188.6 lb

## 2017-12-04 DIAGNOSIS — Z7901 Long term (current) use of anticoagulants: Secondary | ICD-10-CM | POA: Insufficient documentation

## 2017-12-04 DIAGNOSIS — G4733 Obstructive sleep apnea (adult) (pediatric): Secondary | ICD-10-CM | POA: Diagnosis not present

## 2017-12-04 DIAGNOSIS — I4819 Other persistent atrial fibrillation: Secondary | ICD-10-CM

## 2017-12-04 DIAGNOSIS — I5042 Chronic combined systolic (congestive) and diastolic (congestive) heart failure: Secondary | ICD-10-CM | POA: Diagnosis not present

## 2017-12-04 DIAGNOSIS — I130 Hypertensive heart and chronic kidney disease with heart failure and stage 1 through stage 4 chronic kidney disease, or unspecified chronic kidney disease: Secondary | ICD-10-CM | POA: Insufficient documentation

## 2017-12-04 DIAGNOSIS — Z79899 Other long term (current) drug therapy: Secondary | ICD-10-CM | POA: Insufficient documentation

## 2017-12-04 DIAGNOSIS — Z87891 Personal history of nicotine dependence: Secondary | ICD-10-CM | POA: Insufficient documentation

## 2017-12-04 DIAGNOSIS — D649 Anemia, unspecified: Secondary | ICD-10-CM | POA: Diagnosis not present

## 2017-12-04 DIAGNOSIS — I5022 Chronic systolic (congestive) heart failure: Secondary | ICD-10-CM | POA: Insufficient documentation

## 2017-12-04 DIAGNOSIS — I447 Left bundle-branch block, unspecified: Secondary | ICD-10-CM | POA: Diagnosis not present

## 2017-12-04 DIAGNOSIS — Z79891 Long term (current) use of opiate analgesic: Secondary | ICD-10-CM | POA: Diagnosis not present

## 2017-12-04 DIAGNOSIS — N183 Chronic kidney disease, stage 3 unspecified: Secondary | ICD-10-CM

## 2017-12-04 DIAGNOSIS — I701 Atherosclerosis of renal artery: Secondary | ICD-10-CM | POA: Insufficient documentation

## 2017-12-04 DIAGNOSIS — I428 Other cardiomyopathies: Secondary | ICD-10-CM

## 2017-12-04 DIAGNOSIS — N184 Chronic kidney disease, stage 4 (severe): Secondary | ICD-10-CM | POA: Diagnosis not present

## 2017-12-04 LAB — BASIC METABOLIC PANEL
Anion gap: 15 (ref 5–15)
BUN: 72 mg/dL — ABNORMAL HIGH (ref 8–23)
CO2: 28 mmol/L (ref 22–32)
Calcium: 10.1 mg/dL (ref 8.9–10.3)
Chloride: 95 mmol/L — ABNORMAL LOW (ref 98–111)
Creatinine, Ser: 2.31 mg/dL — ABNORMAL HIGH (ref 0.44–1.00)
GFR calc Af Amer: 22 mL/min — ABNORMAL LOW (ref >60)
GFR calc non Af Amer: 19 mL/min — ABNORMAL LOW (ref >60)
Glucose, Bld: 100 mg/dL — ABNORMAL HIGH (ref 70–99)
Potassium: 3.4 mmol/L — ABNORMAL LOW (ref 3.5–5.1)
Sodium: 138 mmol/L (ref 135–145)

## 2017-12-04 LAB — CBC
HCT: 34.1 % — ABNORMAL LOW (ref 36.0–46.0)
Hemoglobin: 10.3 g/dL — ABNORMAL LOW (ref 12.0–15.0)
MCH: 28.6 pg (ref 26.0–34.0)
MCHC: 30.2 g/dL (ref 30.0–36.0)
MCV: 94.7 fL (ref 78.0–100.0)
Platelets: 271 10*3/uL (ref 150–400)
RBC: 3.6 MIL/uL — ABNORMAL LOW (ref 3.87–5.11)
RDW: 17.9 % — ABNORMAL HIGH (ref 11.5–15.5)
WBC: 6.8 10*3/uL (ref 4.0–10.5)

## 2017-12-04 MED ORDER — CARVEDILOL 3.125 MG PO TABS: 3.1250 mg | ORAL_TABLET | Freq: Two times a day (BID) | ORAL | 11 refills | Status: DC

## 2017-12-04 MED ORDER — TORSEMIDE 20 MG PO TABS: 40.0000 mg | ORAL_TABLET | Freq: Every day | ORAL | 2 refills | Status: DC

## 2017-12-04 MED ORDER — METOLAZONE 2.5 MG PO TABS: 2.5000 mg | ORAL_TABLET | ORAL | 11 refills | Status: DC

## 2017-12-04 MED ORDER — SPIRONOLACTONE 25 MG PO TABS: 25.0000 mg | ORAL_TABLET | Freq: Every day | ORAL | 3 refills | Status: DC

## 2017-12-04 NOTE — Patient Instructions (Signed)
Medication Instructions:  Your physician has recommended you make the following change in your medication:   START: carvedilol (coreg) 3.125 mg tablet: Take 1 tablet by mouth twice a day   Labwork: TODAY: BMET, CBC  Testing/Procedures: None ordered  Follow-Up: Your physician recommends that you schedule a follow-up appointment in: 1 month with APP  Your physician recommends that you schedule a follow-up appointment in: 2 months with Dr. Aundra Dubin    Any Other Special Instructions Will Be Listed Below (If Applicable).     If you need a refill on your cardiac medications before your next appointment, please call your pharmacy.

## 2017-12-04 NOTE — Progress Notes (Signed)
PCP: Dr Woody Seller  HF Cardiology: Dr Aundra Dubin  Neprology: Dr Hinda Lenis  HPI: Gina [name] is a 77 y.o.female with h/o chronic systolic CHF, anemia, HTN, LBBB, renal artery stenosis, and OSA.   Admitted 09/02/2017 with peripheral edema and worsening SOB. Attempts at diuresis were difficult due to AKI. Transferred to [facility] 09/10/17 in setting of cardiogenic shock 2/2 Afib RVR. She required milrinone.  She underwent DCCV 7/11 and 7/14. Discussed with EP after she went back into Afib a third time. Underwent DCCV 7/18 and stayed in for NSR most of the day, but then went back into Afib. Underwent AV nodal ablation and St Jude PPM implantation 09/19/17 with Dr. Caryl Comes. Unable to place an LV lead. She was discharged to SNF. Last echo in 7/19 with EF 20%.    She returns today for followup of HF.  She is home from the SNF.   Weight is down 2 more lbs.  Creatinine has been elevated, which has complicated management of her volume overload.  However, she says that she has been doing well recently.  She is back on spironolactone (her nephrologist restarted).  Breathing is doing ok walking around the house.  She can get around the grocery store.  She is not short of breath with dressing, showering.  She does get short of breath walking up an incline.  No orthopnea/PND. No lightheadedness.   Labs (8/19): K 3.6, creatinine 2.75  PMH: 1. OSA: Uses CPAP.  2. Atrial fibrillation: permanent.  Now s/p AV nodal ablation with St Jude PPM.  3. Chronic systolic CHF: Since 1517, nonischemic cardiomyopathy.  - LHC (2017): Normal coronaries.  - Echo (7/19): EF 20% with mild LV dilation and diffuse hypokinesis, mild RV dilation with mild to moderate systolic dysfunction.  - Low output HF in 7/19 requiring milrinone.  4. CKD stage 3-4.  5. H/o LBBB 6. Renal artery stenosis: 80% on left.   Review of systems complete and found to be negative unless listed in HPI.   Social History   Socioeconomic History  . Marital status: Married     Spouse name: Not on file  . Number of children: 0  . Years of education: Not on file  . Highest education level: Not on file  Occupational History  . Not on file  Social Needs  . Financial resource strain: Not on file  . Food insecurity:    Worry: Not on file    Inability: Not on file  . Transportation needs:    Medical: Not on file    Non-medical: Not on file  Tobacco Use  . Smoking status: Former Smoker    Packs/day: 1.00    Years: 24.00    Pack years: 24.00    Types: Cigarettes    Start date: 10/29/1958    Last attempt to quit: 12/02/1985    Years since quitting: 32.0  . Smokeless tobacco: Never Used  Substance and Sexual Activity  . Alcohol use: No    Alcohol/week: 0.0 standard drinks  . Drug use: Not on file  . Sexual activity: Not on file  Lifestyle  . Physical activity:    Days per week: Not on file    Minutes per session: Not on file  . Stress: Not on file  Relationships  . Social connections:    Talks on phone: Not on file    Gets together: Not on file    Attends religious service: Not on file    Active member of club or organization:  Not on file    Attends meetings of clubs or organizations: Not on file    Relationship status: Not on file  . Intimate partner violence:    Fear of current or ex partner: Not on file    Emotionally abused: Not on file    Physically abused: Not on file    Forced sexual activity: Not on file  Other Topics Concern  . Not on file  Social History Narrative  . Not on file    Family History  Problem Relation Age of Onset  . Rheumatic fever Mother        in her early 77's  . Alzheimer's disease Mother   . Stomach cancer Father 71       died 36  . Bradycardia Brother        has pacemaker  . Heart failure Paternal Grandmother   . Heart failure Paternal Grandfather     Current Outpatient Medications  Medication Sig Dispense Refill  . acetaminophen (TYLENOL) 325 MG tablet Take 2 tablets (650 mg total) by mouth every 4  (four) hours as needed for headache or mild pain.    [name] apixaban (ELIQUIS) 5 MG TABS tablet Take 1 tablet (5 mg total) by mouth 2 (two) times daily. 60 tablet 3  . bisacodyl (DULCOLAX) 5 MG EC tablet Take 2 tablets (10 mg total) by mouth daily as needed for moderate constipation. 30 tablet 0  . calcitRIOL (ROCALTROL) 0.25 MCG capsule Take 0.25 mcg by mouth daily.    . metolazone (ZAROXOLYN) 2.5 MG tablet Take 1 tablet (2.5 mg total) by mouth once a week. Wednesday mornings. 5 tablet 11  . Multiple Vitamin (MULTIVITAMIN) tablet Take 1 tablet by mouth daily.    . Multiple Vitamins-Minerals (ICAPS PO) Take 1 capsule by mouth daily.    [name] spironolactone (ALDACTONE) 25 MG tablet Take 1 tablet (25 mg total) by mouth daily. 90 tablet 3  . torsemide (DEMADEX) 20 MG tablet Take 2 tablets (40 mg total) by mouth daily. 180 tablet 2  . traMADol (ULTRAM) 50 MG tablet Take 50 mg by mouth every 12 (twelve) hours as needed.    . carvedilol (COREG) 3.125 MG tablet Take 1 tablet (3.125 mg total) by mouth 2 (two) times daily. 60 tablet 11   No current facility-administered medications for this encounter.     Vitals:   12/04/17 1232  BP: 116/65  Pulse: 72  SpO2: 100%  Weight: 85.5 kg (188 lb 9.6 oz)   Wt Readings from Last 3 Encounters:  12/04/17 85.5 kg (188 lb 9.6 oz)  11/13/17 86.3 kg (190 lb 3.2 oz)  10/16/17 97.4 kg (214 lb 12.8 oz)   PHYSICAL EXAM: General: NAD Neck: JVP 7-8 cm, no thyromegaly or thyroid nodule.  Lungs: Clear to auscultation bilaterally with normal respiratory effort. CV: Nondisplaced PMI.  Heart regular S1/S2, no S3/S4, 2/6 early SEM RUSB.  Trace left ankle edema.  No carotid bruit.  Normal pedal pulses.  Abdomen: Soft, nontender, no hepatosplenomegaly, no distention.  Skin: Intact without lesions or rashes.  Neurologic: Alert and oriented x 3.  Psych: Normal affect. Extremities: No clubbing or cyanosis.  HEENT: Normal.   ASSESSMENT & PLAN: 1. Chronic Systolic CHF: Echo  (3/81) with EF 20%. Long-standing cardiomyopathy, nonischemic based on cath in 2017. 7/19 hospitalization withlow output HF likely triggered by atrial fibrillation/RVR.  She was started on milrinone.  DCCVs failed and she ultimately had AV nodal ablation with St Jude PPM placed (unable to place  LV lead).  Able to wean off milrinone.  Her weight has been steadily trending down.  NYHA class II-III symptoms.  She is not volume overloaded on exam.  - Continue torsemide 40 mg daily and spironolactone 25 daily.  BMET today.  - Continue metolazone 2.5 q Wed.   - Start Coreg 3.125 mg bid.  - No ACEI/ARNI/ARB with elevated creatinine.  - Needs to see Dr. Caryl Comes again to consider re-attempting LV lead placement.  2.CKD stage 3: BMET today.  Nephrology placed back on spironolactone 25 mg daily.  3. Atrial fibrillation: Chronic.  Poor rate control.  She is now s/p AV nodal ablation with St Jude PPM.  - Continue Eliquis 5 mg twice a day.  5. OSA - Continue CPAP qHS.  Followup in 1 month with APP, 2 months with me.   [name] 12/04/2017

## 2017-12-05 DIAGNOSIS — I5022 Chronic systolic (congestive) heart failure: Secondary | ICD-10-CM | POA: Diagnosis not present

## 2017-12-05 DIAGNOSIS — I2510 Atherosclerotic heart disease of native coronary artery without angina pectoris: Secondary | ICD-10-CM | POA: Diagnosis not present

## 2017-12-05 DIAGNOSIS — I701 Atherosclerosis of renal artery: Secondary | ICD-10-CM | POA: Diagnosis not present

## 2017-12-05 DIAGNOSIS — D631 Anemia in chronic kidney disease: Secondary | ICD-10-CM | POA: Diagnosis not present

## 2017-12-05 DIAGNOSIS — I130 Hypertensive heart and chronic kidney disease with heart failure and stage 1 through stage 4 chronic kidney disease, or unspecified chronic kidney disease: Secondary | ICD-10-CM | POA: Diagnosis not present

## 2017-12-05 DIAGNOSIS — N183 Chronic kidney disease, stage 3 (moderate): Secondary | ICD-10-CM | POA: Diagnosis not present

## 2017-12-08 DIAGNOSIS — I701 Atherosclerosis of renal artery: Secondary | ICD-10-CM | POA: Diagnosis not present

## 2017-12-08 DIAGNOSIS — I2510 Atherosclerotic heart disease of native coronary artery without angina pectoris: Secondary | ICD-10-CM | POA: Diagnosis not present

## 2017-12-08 DIAGNOSIS — I130 Hypertensive heart and chronic kidney disease with heart failure and stage 1 through stage 4 chronic kidney disease, or unspecified chronic kidney disease: Secondary | ICD-10-CM | POA: Diagnosis not present

## 2017-12-08 DIAGNOSIS — D631 Anemia in chronic kidney disease: Secondary | ICD-10-CM | POA: Diagnosis not present

## 2017-12-08 DIAGNOSIS — I5022 Chronic systolic (congestive) heart failure: Secondary | ICD-10-CM | POA: Diagnosis not present

## 2017-12-08 DIAGNOSIS — N183 Chronic kidney disease, stage 3 (moderate): Secondary | ICD-10-CM | POA: Diagnosis not present

## 2017-12-10 DIAGNOSIS — I701 Atherosclerosis of renal artery: Secondary | ICD-10-CM | POA: Diagnosis not present

## 2017-12-10 DIAGNOSIS — I2510 Atherosclerotic heart disease of native coronary artery without angina pectoris: Secondary | ICD-10-CM | POA: Diagnosis not present

## 2017-12-10 DIAGNOSIS — N183 Chronic kidney disease, stage 3 (moderate): Secondary | ICD-10-CM | POA: Diagnosis not present

## 2017-12-10 DIAGNOSIS — I130 Hypertensive heart and chronic kidney disease with heart failure and stage 1 through stage 4 chronic kidney disease, or unspecified chronic kidney disease: Secondary | ICD-10-CM | POA: Diagnosis not present

## 2017-12-10 DIAGNOSIS — I5022 Chronic systolic (congestive) heart failure: Secondary | ICD-10-CM | POA: Diagnosis not present

## 2017-12-10 DIAGNOSIS — D631 Anemia in chronic kidney disease: Secondary | ICD-10-CM | POA: Diagnosis not present

## 2017-12-12 DIAGNOSIS — N183 Chronic kidney disease, stage 3 (moderate): Secondary | ICD-10-CM | POA: Diagnosis not present

## 2017-12-12 DIAGNOSIS — I2510 Atherosclerotic heart disease of native coronary artery without angina pectoris: Secondary | ICD-10-CM | POA: Diagnosis not present

## 2017-12-12 DIAGNOSIS — I130 Hypertensive heart and chronic kidney disease with heart failure and stage 1 through stage 4 chronic kidney disease, or unspecified chronic kidney disease: Secondary | ICD-10-CM | POA: Diagnosis not present

## 2017-12-12 DIAGNOSIS — I701 Atherosclerosis of renal artery: Secondary | ICD-10-CM | POA: Diagnosis not present

## 2017-12-12 DIAGNOSIS — D631 Anemia in chronic kidney disease: Secondary | ICD-10-CM | POA: Diagnosis not present

## 2017-12-12 DIAGNOSIS — I5022 Chronic systolic (congestive) heart failure: Secondary | ICD-10-CM | POA: Diagnosis not present

## 2017-12-16 DIAGNOSIS — I5022 Chronic systolic (congestive) heart failure: Secondary | ICD-10-CM | POA: Diagnosis not present

## 2017-12-16 DIAGNOSIS — I701 Atherosclerosis of renal artery: Secondary | ICD-10-CM | POA: Diagnosis not present

## 2017-12-16 DIAGNOSIS — N183 Chronic kidney disease, stage 3 (moderate): Secondary | ICD-10-CM | POA: Diagnosis not present

## 2017-12-16 DIAGNOSIS — D631 Anemia in chronic kidney disease: Secondary | ICD-10-CM | POA: Diagnosis not present

## 2017-12-16 DIAGNOSIS — I130 Hypertensive heart and chronic kidney disease with heart failure and stage 1 through stage 4 chronic kidney disease, or unspecified chronic kidney disease: Secondary | ICD-10-CM | POA: Diagnosis not present

## 2017-12-16 DIAGNOSIS — I2510 Atherosclerotic heart disease of native coronary artery without angina pectoris: Secondary | ICD-10-CM | POA: Diagnosis not present

## 2017-12-17 DIAGNOSIS — D631 Anemia in chronic kidney disease: Secondary | ICD-10-CM | POA: Diagnosis not present

## 2017-12-17 DIAGNOSIS — N183 Chronic kidney disease, stage 3 (moderate): Secondary | ICD-10-CM | POA: Diagnosis not present

## 2017-12-17 DIAGNOSIS — I5022 Chronic systolic (congestive) heart failure: Secondary | ICD-10-CM | POA: Diagnosis not present

## 2017-12-17 DIAGNOSIS — I701 Atherosclerosis of renal artery: Secondary | ICD-10-CM | POA: Diagnosis not present

## 2017-12-17 DIAGNOSIS — I2510 Atherosclerotic heart disease of native coronary artery without angina pectoris: Secondary | ICD-10-CM | POA: Diagnosis not present

## 2017-12-17 DIAGNOSIS — I130 Hypertensive heart and chronic kidney disease with heart failure and stage 1 through stage 4 chronic kidney disease, or unspecified chronic kidney disease: Secondary | ICD-10-CM | POA: Diagnosis not present

## 2017-12-18 DIAGNOSIS — I2510 Atherosclerotic heart disease of native coronary artery without angina pectoris: Secondary | ICD-10-CM | POA: Diagnosis not present

## 2017-12-18 DIAGNOSIS — I701 Atherosclerosis of renal artery: Secondary | ICD-10-CM | POA: Diagnosis not present

## 2017-12-18 DIAGNOSIS — N183 Chronic kidney disease, stage 3 (moderate): Secondary | ICD-10-CM | POA: Diagnosis not present

## 2017-12-18 DIAGNOSIS — I5022 Chronic systolic (congestive) heart failure: Secondary | ICD-10-CM | POA: Diagnosis not present

## 2017-12-18 DIAGNOSIS — D631 Anemia in chronic kidney disease: Secondary | ICD-10-CM | POA: Diagnosis not present

## 2017-12-18 DIAGNOSIS — I130 Hypertensive heart and chronic kidney disease with heart failure and stage 1 through stage 4 chronic kidney disease, or unspecified chronic kidney disease: Secondary | ICD-10-CM | POA: Diagnosis not present

## 2017-12-23 DIAGNOSIS — I5022 Chronic systolic (congestive) heart failure: Secondary | ICD-10-CM | POA: Diagnosis not present

## 2017-12-23 DIAGNOSIS — D631 Anemia in chronic kidney disease: Secondary | ICD-10-CM | POA: Diagnosis not present

## 2017-12-23 DIAGNOSIS — I130 Hypertensive heart and chronic kidney disease with heart failure and stage 1 through stage 4 chronic kidney disease, or unspecified chronic kidney disease: Secondary | ICD-10-CM | POA: Diagnosis not present

## 2017-12-23 DIAGNOSIS — N183 Chronic kidney disease, stage 3 (moderate): Secondary | ICD-10-CM | POA: Diagnosis not present

## 2017-12-23 DIAGNOSIS — I2510 Atherosclerotic heart disease of native coronary artery without angina pectoris: Secondary | ICD-10-CM | POA: Diagnosis not present

## 2017-12-23 DIAGNOSIS — I701 Atherosclerosis of renal artery: Secondary | ICD-10-CM | POA: Diagnosis not present

## 2017-12-24 DIAGNOSIS — N183 Chronic kidney disease, stage 3 (moderate): Secondary | ICD-10-CM | POA: Diagnosis not present

## 2017-12-24 DIAGNOSIS — I701 Atherosclerosis of renal artery: Secondary | ICD-10-CM | POA: Diagnosis not present

## 2017-12-24 DIAGNOSIS — I130 Hypertensive heart and chronic kidney disease with heart failure and stage 1 through stage 4 chronic kidney disease, or unspecified chronic kidney disease: Secondary | ICD-10-CM | POA: Diagnosis not present

## 2017-12-24 DIAGNOSIS — I5022 Chronic systolic (congestive) heart failure: Secondary | ICD-10-CM | POA: Diagnosis not present

## 2017-12-24 DIAGNOSIS — I2510 Atherosclerotic heart disease of native coronary artery without angina pectoris: Secondary | ICD-10-CM | POA: Diagnosis not present

## 2017-12-24 DIAGNOSIS — D631 Anemia in chronic kidney disease: Secondary | ICD-10-CM | POA: Diagnosis not present

## 2017-12-25 DIAGNOSIS — I5022 Chronic systolic (congestive) heart failure: Secondary | ICD-10-CM | POA: Diagnosis not present

## 2017-12-25 DIAGNOSIS — N183 Chronic kidney disease, stage 3 (moderate): Secondary | ICD-10-CM | POA: Diagnosis not present

## 2017-12-25 DIAGNOSIS — I130 Hypertensive heart and chronic kidney disease with heart failure and stage 1 through stage 4 chronic kidney disease, or unspecified chronic kidney disease: Secondary | ICD-10-CM | POA: Diagnosis not present

## 2017-12-25 DIAGNOSIS — D631 Anemia in chronic kidney disease: Secondary | ICD-10-CM | POA: Diagnosis not present

## 2017-12-25 DIAGNOSIS — I701 Atherosclerosis of renal artery: Secondary | ICD-10-CM | POA: Diagnosis not present

## 2017-12-25 DIAGNOSIS — I2510 Atherosclerotic heart disease of native coronary artery without angina pectoris: Secondary | ICD-10-CM | POA: Diagnosis not present

## 2017-12-30 DIAGNOSIS — I2510 Atherosclerotic heart disease of native coronary artery without angina pectoris: Secondary | ICD-10-CM | POA: Diagnosis not present

## 2017-12-30 DIAGNOSIS — I701 Atherosclerosis of renal artery: Secondary | ICD-10-CM | POA: Diagnosis not present

## 2017-12-30 DIAGNOSIS — N183 Chronic kidney disease, stage 3 (moderate): Secondary | ICD-10-CM | POA: Diagnosis not present

## 2017-12-30 DIAGNOSIS — D631 Anemia in chronic kidney disease: Secondary | ICD-10-CM | POA: Diagnosis not present

## 2017-12-30 DIAGNOSIS — I130 Hypertensive heart and chronic kidney disease with heart failure and stage 1 through stage 4 chronic kidney disease, or unspecified chronic kidney disease: Secondary | ICD-10-CM | POA: Diagnosis not present

## 2017-12-30 DIAGNOSIS — I5022 Chronic systolic (congestive) heart failure: Secondary | ICD-10-CM | POA: Diagnosis not present

## 2018-01-01 DIAGNOSIS — D631 Anemia in chronic kidney disease: Secondary | ICD-10-CM | POA: Diagnosis not present

## 2018-01-01 DIAGNOSIS — N183 Chronic kidney disease, stage 3 (moderate): Secondary | ICD-10-CM | POA: Diagnosis not present

## 2018-01-01 DIAGNOSIS — I701 Atherosclerosis of renal artery: Secondary | ICD-10-CM | POA: Diagnosis not present

## 2018-01-01 DIAGNOSIS — I5022 Chronic systolic (congestive) heart failure: Secondary | ICD-10-CM | POA: Diagnosis not present

## 2018-01-01 DIAGNOSIS — I130 Hypertensive heart and chronic kidney disease with heart failure and stage 1 through stage 4 chronic kidney disease, or unspecified chronic kidney disease: Secondary | ICD-10-CM | POA: Diagnosis not present

## 2018-01-01 DIAGNOSIS — I2510 Atherosclerotic heart disease of native coronary artery without angina pectoris: Secondary | ICD-10-CM | POA: Diagnosis not present

## 2018-01-06 NOTE — Progress Notes (Signed)
PCP: Dr Woody Seller  HF Cardiology: Dr Aundra Dubin  Neprology: Dr Hinda Lenis  HPI: Gina [name] is a 77 y.o.female with h/o chronic systolic CHF, anemia, HTN, LBBB, renal artery stenosis, and OSA.   Admitted 09/02/2017 with peripheral edema and worsening SOB. Attempts at diuresis were difficult due to AKI. Transferred to [facility] 09/10/17 in setting of cardiogenic shock 2/2 Afib RVR. She required milrinone.  She underwent DCCV 7/11 and 7/14. Discussed with EP after she went back into Afib a third time. Underwent DCCV 7/18 and stayed in for NSR most of the day, but then went back into Afib. Underwent AV nodal ablation and St Jude PPM implantation 09/19/17 with Dr. Caryl Comes. Unable to place an LV lead. She was discharged to SNF. Last echo in 7/19 with EF 20%.    She returns today for HF follow up. Last visit, coreg was added. Overall doing well. She remains SOB when she walks too fast in the house. None with ADLs. Denies orthopnea, PND, or edema. Wearing CPAP qHS. No CP, cough, or dizziness. Appetite and energy level good. Good UOP with torsemide. Taking all medications. Tries to limit salt intake, but does eat out occasionally. Weights ~189 lbs at home. Her weight usually goes down significantly after taking her weekly metolazone (took today).   Corvue: thoracic impedence just crossed below threshold and trending down. Active 1-3 hours/day  Labs (8/19): K 3.6, creatinine 2.75 Labs (10/19): K 3.4, creatinine 2.31  PMH: 1. OSA: Uses CPAP.  2. Atrial fibrillation: permanent.  Now s/p AV nodal ablation with St Jude PPM.  3. Chronic systolic CHF: Since 7824, nonischemic cardiomyopathy.  - LHC (2017): Normal coronaries.  - Echo (7/19): EF 20% with mild LV dilation and diffuse hypokinesis, mild RV dilation with mild to moderate systolic dysfunction.  - Low output HF in 7/19 requiring milrinone.  4. CKD stage 3-4.  5. H/o LBBB 6. Renal artery stenosis: 80% on left.   Review of systems complete and found to be negative  unless listed in HPI.   Social History   Socioeconomic History  . Marital status: Married    Spouse name: Not on file  . Number of children: 0  . Years of education: Not on file  . Highest education level: Not on file  Occupational History  . Not on file  Social Needs  . Financial resource strain: Not on file  . Food insecurity:    Worry: Not on file    Inability: Not on file  . Transportation needs:    Medical: Not on file    Non-medical: Not on file  Tobacco Use  . Smoking status: Former Smoker    Packs/day: 1.00    Years: 24.00    Pack years: 24.00    Types: Cigarettes    Start date: 10/29/1958    Last attempt to quit: 12/02/1985    Years since quitting: 32.1  . Smokeless tobacco: Never Used  Substance and Sexual Activity  . Alcohol use: No    Alcohol/week: 0.0 standard drinks  . Drug use: Not on file  . Sexual activity: Not on file  Lifestyle  . Physical activity:    Days per week: Not on file    Minutes per session: Not on file  . Stress: Not on file  Relationships  . Social connections:    Talks on phone: Not on file    Gets together: Not on file    Attends religious service: Not on file    Active member  of club or organization: Not on file    Attends meetings of clubs or organizations: Not on file    Relationship status: Not on file  . Intimate partner violence:    Fear of current or ex partner: Not on file    Emotionally abused: Not on file    Physically abused: Not on file    Forced sexual activity: Not on file  Other Topics Concern  . Not on file  Social History Narrative  . Not on file    Family History  Problem Relation Age of Onset  . Rheumatic fever Mother        in her early 60's  . Alzheimer's disease Mother   . Stomach cancer Father 34       died 72  . Bradycardia Brother        has pacemaker  . Heart failure Paternal Grandmother   . Heart failure Paternal Grandfather     Current Outpatient Medications  Medication Sig Dispense  Refill  . acetaminophen (TYLENOL) 325 MG tablet Take 2 tablets (650 mg total) by mouth every 4 (four) hours as needed for headache or mild pain.    [name] apixaban (ELIQUIS) 5 MG TABS tablet Take 1 tablet (5 mg total) by mouth 2 (two) times daily. 60 tablet 3  . carvedilol (COREG) 3.125 MG tablet Take 1 tablet (3.125 mg total) by mouth 2 (two) times daily. 60 tablet 11  . metolazone (ZAROXOLYN) 2.5 MG tablet Take 1 tablet (2.5 mg total) by mouth once a week. Wednesday mornings. 5 tablet 11  . Multiple Vitamin (MULTIVITAMIN) tablet Take 1 tablet by mouth daily.    [name] spironolactone (ALDACTONE) 25 MG tablet Take 1 tablet (25 mg total) by mouth daily. 90 tablet 3  . torsemide (DEMADEX) 20 MG tablet Take 2 tablets (40 mg total) by mouth daily. 180 tablet 2   No current facility-administered medications for this encounter.     Vitals:   01/07/18 1110  BP: 140/78  Pulse: 69  SpO2: 100%  Weight: 88.1 kg (194 lb 3.2 oz)   Wt Readings from Last 3 Encounters:  01/07/18 88.1 kg (194 lb 3.2 oz)  12/04/17 85.5 kg (188 lb 9.6 oz)  11/13/17 86.3 kg (190 lb 3.2 oz)   PHYSICAL EXAM: General: No resp difficulty. HEENT: Normal Neck: Supple. JVP 7-8. Carotids 2+ bilat; no bruits. No thyromegaly or nodule noted. Cor: PMI nondisplaced. RRR, 2/6 early SEM RUSB Lungs: CTAB, normal effort. Abdomen: Soft, non-tender, non-distended, no HSM. No bruits or masses. +BS  Extremities: No cyanosis, clubbing, or rash. R and LLE no edema.  Neuro: Alert & orientedx3, cranial nerves grossly intact. moves all 4 extremities w/o difficulty. Affect pleasant   ASSESSMENT & PLAN: 1. Chronic Systolic CHF: Echo (5/46) with EF 20%. Long-standing cardiomyopathy, nonischemic based on cath in 2017. 7/19 hospitalization withlow output HF likely triggered by atrial fibrillation/RVR.  She was started on milrinone.  DCCVs failed and she ultimately had AV nodal ablation with St Jude PPM placed (unable to place LV lead).  Able to wean  off milrinone.  Her weight has been steadily trending down.  NYHA class II-III symptoms. Volume okay on exam, trending up on corvue. - Continue torsemide 40 mg daily. Encouraged her to take additional 40 mg torsemide tomorrow if weight doesn't come down with metolazone today. - Continue spironolactone 25 daily.    - Continue metolazone 2.5 q Wed.   - Continue Coreg 3.125 mg bid. HR 60s. Will not increase  today.  - No ACEI/ARNI/ARB with elevated creatinine.  - Needs to see Dr. Caryl Comes again to consider re-attempting LV lead placement. Sees Dr Caryl Comes 11/15. 2.CKD stage 3:  Nephrology placed back on spironolactone 25 mg daily. Creatinine had improved to 2.3 on 10/3. Followed by Dr Hinda Lenis 3. Atrial fibrillation: Chronic. She is now s/p AV nodal ablation with St Jude PPM.  - Continue Eliquis 5 mg twice a day. Denies bleeding.  - HR well controlled with coreg.  5. OSA - Continue CPAP qHS. No change. 6. HTN - Mildly elevated today, but typically runs SBP 110-120s.   Take 40 mg torsemide tomorrow if weight not back down to dry weight 185 lbs.  Keep follow up with Dr Caryl Comes 11/15 Keep follow up with Dr Aundra Dubin 1/2  Georgiana Shore 01/07/2018  Greater than 50% of the 25 minute visit was spent in counseling/coordination of care regarding disease state education, salt/fluid restriction, sliding scale diuretics, and medication compliance.

## 2018-01-07 ENCOUNTER — Ambulatory Visit (HOSPITAL_COMMUNITY)
Admission: RE | Admit: 2018-01-07 | Discharge: 2018-01-07 | Disposition: A | Discharge status: Home / Self Care | Payer: Medicare Other | Source: Ambulatory Visit | Attending: Cardiology | Admitting: Cardiology

## 2018-01-07 ENCOUNTER — Other Ambulatory Visit: Payer: Self-pay

## 2018-01-07 VITALS — BP 140/78 | HR 69 | Wt 194.2 lb

## 2018-01-07 DIAGNOSIS — I4821 Permanent atrial fibrillation: Secondary | ICD-10-CM | POA: Diagnosis not present

## 2018-01-07 DIAGNOSIS — I10 Essential (primary) hypertension: Secondary | ICD-10-CM | POA: Diagnosis not present

## 2018-01-07 DIAGNOSIS — D649 Anemia, unspecified: Secondary | ICD-10-CM | POA: Diagnosis not present

## 2018-01-07 DIAGNOSIS — G4733 Obstructive sleep apnea (adult) (pediatric): Secondary | ICD-10-CM | POA: Diagnosis not present

## 2018-01-07 DIAGNOSIS — I428 Other cardiomyopathies: Secondary | ICD-10-CM | POA: Diagnosis not present

## 2018-01-07 DIAGNOSIS — Z7901 Long term (current) use of anticoagulants: Secondary | ICD-10-CM | POA: Insufficient documentation

## 2018-01-07 DIAGNOSIS — I5022 Chronic systolic (congestive) heart failure: Secondary | ICD-10-CM

## 2018-01-07 DIAGNOSIS — N183 Chronic kidney disease, stage 3 unspecified: Secondary | ICD-10-CM

## 2018-01-07 DIAGNOSIS — Z79899 Other long term (current) drug therapy: Secondary | ICD-10-CM | POA: Diagnosis not present

## 2018-01-07 DIAGNOSIS — Z8249 Family history of ischemic heart disease and other diseases of the circulatory system: Secondary | ICD-10-CM | POA: Insufficient documentation

## 2018-01-07 DIAGNOSIS — I701 Atherosclerosis of renal artery: Secondary | ICD-10-CM | POA: Insufficient documentation

## 2018-01-07 DIAGNOSIS — N184 Chronic kidney disease, stage 4 (severe): Secondary | ICD-10-CM | POA: Insufficient documentation

## 2018-01-07 DIAGNOSIS — I4819 Other persistent atrial fibrillation: Secondary | ICD-10-CM

## 2018-01-07 DIAGNOSIS — I130 Hypertensive heart and chronic kidney disease with heart failure and stage 1 through stage 4 chronic kidney disease, or unspecified chronic kidney disease: Secondary | ICD-10-CM | POA: Diagnosis not present

## 2018-01-07 DIAGNOSIS — Z87891 Personal history of nicotine dependence: Secondary | ICD-10-CM | POA: Diagnosis not present

## 2018-01-07 DIAGNOSIS — R0602 Shortness of breath: Secondary | ICD-10-CM | POA: Insufficient documentation

## 2018-01-07 DIAGNOSIS — I447 Left bundle-branch block, unspecified: Secondary | ICD-10-CM | POA: Insufficient documentation

## 2018-01-07 NOTE — Patient Instructions (Signed)
Be sure to take an additional 40 mg of Torsemide 01/08/18 if your weight down not come down below 185 lbs.   Your physician recommends that you schedule a follow-up appointment in: keep your follow up as scheduled in 8 weeks with Dr Aundra Dubin  Do the following things EVERYDAY: 1) Weigh yourself in the morning before breakfast. Write it down and keep it in a log. 2) Take your medicines as prescribed 3) Eat low salt foods-Limit salt (sodium) to 2000 mg per day.  4) Stay as active as you can everyday 5) Limit all fluids for the day to less than 2 liters

## 2018-01-16 ENCOUNTER — Encounter: Payer: Medicare Other | Admitting: Internal Medicine

## 2018-01-26 DIAGNOSIS — I129 Hypertensive chronic kidney disease with stage 1 through stage 4 chronic kidney disease, or unspecified chronic kidney disease: Secondary | ICD-10-CM | POA: Diagnosis not present

## 2018-01-26 DIAGNOSIS — N183 Chronic kidney disease, stage 3 (moderate): Secondary | ICD-10-CM | POA: Diagnosis not present

## 2018-01-26 DIAGNOSIS — D509 Iron deficiency anemia, unspecified: Secondary | ICD-10-CM | POA: Diagnosis not present

## 2018-01-26 DIAGNOSIS — E559 Vitamin D deficiency, unspecified: Secondary | ICD-10-CM | POA: Diagnosis not present

## 2018-01-26 DIAGNOSIS — Z79899 Other long term (current) drug therapy: Secondary | ICD-10-CM | POA: Diagnosis not present

## 2018-01-27 DIAGNOSIS — E559 Vitamin D deficiency, unspecified: Secondary | ICD-10-CM | POA: Diagnosis not present

## 2018-01-27 DIAGNOSIS — Z79899 Other long term (current) drug therapy: Secondary | ICD-10-CM | POA: Diagnosis not present

## 2018-01-27 DIAGNOSIS — R809 Proteinuria, unspecified: Secondary | ICD-10-CM | POA: Diagnosis not present

## 2018-01-27 DIAGNOSIS — I129 Hypertensive chronic kidney disease with stage 1 through stage 4 chronic kidney disease, or unspecified chronic kidney disease: Secondary | ICD-10-CM | POA: Diagnosis not present

## 2018-01-27 DIAGNOSIS — N183 Chronic kidney disease, stage 3 (moderate): Secondary | ICD-10-CM | POA: Diagnosis not present

## 2018-01-27 DIAGNOSIS — D509 Iron deficiency anemia, unspecified: Secondary | ICD-10-CM | POA: Diagnosis not present

## 2018-02-03 DIAGNOSIS — N184 Chronic kidney disease, stage 4 (severe): Secondary | ICD-10-CM | POA: Diagnosis not present

## 2018-02-03 DIAGNOSIS — R809 Proteinuria, unspecified: Secondary | ICD-10-CM | POA: Diagnosis not present

## 2018-02-03 DIAGNOSIS — E875 Hyperkalemia: Secondary | ICD-10-CM | POA: Diagnosis not present

## 2018-02-03 DIAGNOSIS — I10 Essential (primary) hypertension: Secondary | ICD-10-CM | POA: Diagnosis not present

## 2018-02-05 DIAGNOSIS — I10 Essential (primary) hypertension: Secondary | ICD-10-CM | POA: Diagnosis not present

## 2018-02-05 DIAGNOSIS — I509 Heart failure, unspecified: Secondary | ICD-10-CM | POA: Diagnosis not present

## 2018-02-05 DIAGNOSIS — M159 Polyosteoarthritis, unspecified: Secondary | ICD-10-CM | POA: Diagnosis not present

## 2018-02-20 ENCOUNTER — Other Ambulatory Visit: Payer: Self-pay | Admitting: Internal Medicine

## 2018-03-05 ENCOUNTER — Ambulatory Visit (HOSPITAL_COMMUNITY)
Admission: RE | Admit: 2018-03-05 | Discharge: 2018-03-05 | Disposition: A | Discharge status: Home / Self Care | Payer: Medicare Other | Source: Ambulatory Visit | Attending: Cardiology | Admitting: Cardiology

## 2018-03-05 ENCOUNTER — Encounter (HOSPITAL_COMMUNITY): Payer: Self-pay | Admitting: Cardiology

## 2018-03-05 VITALS — BP 120/68 | HR 69 | Wt 193.6 lb

## 2018-03-05 DIAGNOSIS — I447 Left bundle-branch block, unspecified: Secondary | ICD-10-CM | POA: Insufficient documentation

## 2018-03-05 DIAGNOSIS — I4819 Other persistent atrial fibrillation: Secondary | ICD-10-CM

## 2018-03-05 DIAGNOSIS — I5022 Chronic systolic (congestive) heart failure: Secondary | ICD-10-CM | POA: Insufficient documentation

## 2018-03-05 DIAGNOSIS — I130 Hypertensive heart and chronic kidney disease with heart failure and stage 1 through stage 4 chronic kidney disease, or unspecified chronic kidney disease: Secondary | ICD-10-CM | POA: Insufficient documentation

## 2018-03-05 DIAGNOSIS — I428 Other cardiomyopathies: Secondary | ICD-10-CM | POA: Insufficient documentation

## 2018-03-05 DIAGNOSIS — Z79899 Other long term (current) drug therapy: Secondary | ICD-10-CM | POA: Insufficient documentation

## 2018-03-05 DIAGNOSIS — Z950 Presence of cardiac pacemaker: Secondary | ICD-10-CM | POA: Insufficient documentation

## 2018-03-05 DIAGNOSIS — N183 Chronic kidney disease, stage 3 unspecified: Secondary | ICD-10-CM

## 2018-03-05 DIAGNOSIS — Z8249 Family history of ischemic heart disease and other diseases of the circulatory system: Secondary | ICD-10-CM | POA: Insufficient documentation

## 2018-03-05 DIAGNOSIS — Z87891 Personal history of nicotine dependence: Secondary | ICD-10-CM | POA: Insufficient documentation

## 2018-03-05 DIAGNOSIS — Z7901 Long term (current) use of anticoagulants: Secondary | ICD-10-CM | POA: Insufficient documentation

## 2018-03-05 DIAGNOSIS — G4733 Obstructive sleep apnea (adult) (pediatric): Secondary | ICD-10-CM | POA: Insufficient documentation

## 2018-03-05 DIAGNOSIS — I4821 Permanent atrial fibrillation: Secondary | ICD-10-CM | POA: Diagnosis not present

## 2018-03-05 DIAGNOSIS — I5042 Chronic combined systolic (congestive) and diastolic (congestive) heart failure: Secondary | ICD-10-CM

## 2018-03-05 LAB — BASIC METABOLIC PANEL
Anion gap: 16 — ABNORMAL HIGH (ref 5–15)
BUN: 85 mg/dL — ABNORMAL HIGH (ref 8–23)
CO2: 25 mmol/L (ref 22–32)
Calcium: 9.4 mg/dL (ref 8.9–10.3)
Chloride: 96 mmol/L — ABNORMAL LOW (ref 98–111)
Creatinine, Ser: 2.45 mg/dL — ABNORMAL HIGH (ref 0.44–1.00)
GFR calc Af Amer: 21 mL/min — ABNORMAL LOW (ref >60)
GFR calc non Af Amer: 18 mL/min — ABNORMAL LOW (ref >60)
Glucose, Bld: 98 mg/dL (ref 70–99)
Potassium: 3.3 mmol/L — ABNORMAL LOW (ref 3.5–5.1)
Sodium: 137 mmol/L (ref 135–145)

## 2018-03-05 MED ORDER — APIXABAN 5 MG PO TABS: 5.0000 mg | ORAL_TABLET | Freq: Two times a day (BID) | ORAL | 3 refills | Status: DC

## 2018-03-05 MED ORDER — CARVEDILOL 6.25 MG PO TABS: 6.2500 mg | ORAL_TABLET | Freq: Two times a day (BID) | ORAL | 11 refills | Status: DC

## 2018-03-05 NOTE — Progress Notes (Signed)
Per MD Aundra Dubin, Coreg increased to 6.25mg  twice a day.  Prescription sent to pharmacy.  Called and left message on patient voicemail with same information. Pt advised to call office if she has any questions.

## 2018-03-05 NOTE — Patient Instructions (Addendum)
INCREASE Torsemide to 60mg  daily for 2 days, THEN back to Torsemide 40mg  daily  INCREASE Coreg to 6.25mg  twice a day  Labs today We will only contact you if something comes back abnormal or we need to make some changes. Otherwise no news is good news!  Your physician recommends that you schedule a follow-up appointment in: 2 months

## 2018-03-07 NOTE — Progress Notes (Signed)
PCP: Dr Woody Seller  HF Cardiology: Dr Aundra Dubin  Neprology: Dr Hinda Lenis  HPI: Gina [name] is a 78 y.o.female with h/o chronic systolic CHF, anemia, HTN, LBBB, renal artery stenosis, and OSA.   Admitted 09/02/2017 with peripheral edema and worsening SOB. Attempts at diuresis were difficult due to AKI. Transferred to [facility] 09/10/17 in setting of cardiogenic shock 2/2 Afib RVR. She required milrinone.  She underwent DCCV 7/11 and 7/14. Discussed with EP after she went back into Afib a third time. Underwent DCCV 7/18 and stayed in for NSR most of the day, but then went back into Afib. Underwent AV nodal ablation and St Jude PPM implantation 09/19/17 with Dr. Caryl Comes. Unable to place an LV lead. She was discharged to SNF. Last echo in 7/19 with EF 20%.    She returns today for followup of HF.  Weight is down 1 lb from last appointment.  She uses a walker because of arthritis.  She is not short of breath walking around her house.  She gets short of breath with moderate exertion.  No orthopnea/PND.  No chest pain.  She has another appt with Dr. Caryl Comes next week.   St Jude device interrogation: 90% RV pacing, impedance decreased.   ECG (personally reviewed): Atrial fibrillation with RV pacing  Labs (8/19): K 3.6, creatinine 2.75 Labs (10/19): K 3.4, creatinine 2.31  PMH: 1. OSA: Uses CPAP.  2. Atrial fibrillation: permanent.  Now s/p AV nodal ablation with St Jude PPM.  3. Chronic systolic CHF: Since 1884, nonischemic cardiomyopathy.  - LHC (2017): Normal coronaries.  - Echo (7/19): EF 20% with mild LV dilation and diffuse hypokinesis, mild RV dilation with mild to moderate systolic dysfunction.  - Low output HF in 7/19 requiring milrinone.  - Unable to place LV lead at time of AV nodal ablation and pacing.  4. CKD stage 3-4.  5. H/o LBBB 6. Renal artery stenosis: 80% on left.   Review of systems complete and found to be negative unless listed in HPI.   Social History   Socioeconomic History  .  Marital status: Married    Spouse name: Not on file  . Number of children: 0  . Years of education: Not on file  . Highest education level: Not on file  Occupational History  . Not on file  Social Needs  . Financial resource strain: Not on file  . Food insecurity:    Worry: Not on file    Inability: Not on file  . Transportation needs:    Medical: Not on file    Non-medical: Not on file  Tobacco Use  . Smoking status: Former Smoker    Packs/day: 1.00    Years: 24.00    Pack years: 24.00    Types: Cigarettes    Start date: 10/29/1958    Last attempt to quit: 12/02/1985    Years since quitting: 32.2  . Smokeless tobacco: Never Used  Substance and Sexual Activity  . Alcohol use: No    Alcohol/week: 0.0 standard drinks  . Drug use: Not on file  . Sexual activity: Not on file  Lifestyle  . Physical activity:    Days per week: Not on file    Minutes per session: Not on file  . Stress: Not on file  Relationships  . Social connections:    Talks on phone: Not on file    Gets together: Not on file    Attends religious service: Not on file    Active member  of club or organization: Not on file    Attends meetings of clubs or organizations: Not on file    Relationship status: Not on file  . Intimate partner violence:    Fear of current or ex partner: Not on file    Emotionally abused: Not on file    Physically abused: Not on file    Forced sexual activity: Not on file  Other Topics Concern  . Not on file  Social History Narrative  . Not on file    Family History  Problem Relation Age of Onset  . Rheumatic fever Mother        in her early 11's  . Alzheimer's disease Mother   . Stomach cancer Father 77       died 76  . Bradycardia Brother        has pacemaker  . Heart failure Paternal Grandmother   . Heart failure Paternal Grandfather     Current Outpatient Medications  Medication Sig Dispense Refill  . acetaminophen (TYLENOL) 325 MG tablet Take 2 tablets (650 mg  total) by mouth every 4 (four) hours as needed for headache or mild pain.    [name] apixaban (ELIQUIS) 5 MG TABS tablet Take 1 tablet (5 mg total) by mouth 2 (two) times daily. 60 tablet 3  . carvedilol (COREG) 6.25 MG tablet Take 1 tablet (6.25 mg total) by mouth 2 (two) times daily. 60 tablet 11  . metolazone (ZAROXOLYN) 2.5 MG tablet Take 1 tablet (2.5 mg total) by mouth once a week. Wednesday mornings. 5 tablet 11  . Multiple Vitamin (MULTIVITAMIN) tablet Take 1 tablet by mouth daily.    [name] spironolactone (ALDACTONE) 25 MG tablet Take 1 tablet (25 mg total) by mouth daily. 90 tablet 3  . torsemide (DEMADEX) 20 MG tablet Take 2 tablets (40 mg total) by mouth daily. 180 tablet 2   No current facility-administered medications for this encounter.     Vitals:   03/05/18 1215  BP: 120/68  Pulse: 69  SpO2: 98%  Weight: 87.8 kg (193 lb 9.6 oz)   Wt Readings from Last 3 Encounters:  03/05/18 87.8 kg (193 lb 9.6 oz)  01/07/18 88.1 kg (194 lb 3.2 oz)  12/04/17 85.5 kg (188 lb 9.6 oz)   PHYSICAL EXAM: General: NAD Neck: No JVD, no thyromegaly or thyroid nodule.  Lungs: Clear to auscultation bilaterally with normal respiratory effort. CV: Nondisplaced PMI.  Heart regular S1/S2, no S3/S4, no murmur.  No peripheral edema.  No carotid bruit.  Normal pedal pulses.  Abdomen: Soft, nontender, no hepatosplenomegaly, no distention.  Skin: Intact without lesions or rashes.  Neurologic: Alert and oriented x 3.  Psych: Normal affect. Extremities: No clubbing or cyanosis.  HEENT: Normal.   ASSESSMENT & PLAN: 1. Chronic Systolic CHF: Echo (5/94) with EF 20%. Long-standing cardiomyopathy, nonischemic based on cath in 2017. 7/19 hospitalization withlow output HF likely triggered by atrial fibrillation/RVR.  She was started on milrinone.  DCCVs failed and she ultimately had AV nodal ablation with St Jude PPM placed (unable to place LV lead).  Able to wean off milrinone.  Her weight is down another lb today.   NYHA class II-III symptoms.  She is not volume overloaded on exam though thoracic impedance low on Corevue.  - I will not increase her diuretic today.  Continue torsemide 40 mg daily and spironolactone 25 daily.  BMET today.  - Continue metolazone 2.5 q Wed.   - Increase Coreg to 6.25 mg bid.   -  Continue spironolactone 25 mg daily.  - No ACEI/ARNI/ARB with elevated creatinine.  - Needs to see Dr. Caryl Comes again to consider re-attempting LV lead placement => she has an appointment next week.   2.CKD stage 3: BMET today.  Follow K closely on spironolactone.   3. Atrial fibrillation: Chronic.  Poor rate control.  She is now s/p AV nodal ablation with St Jude PPM.  - Continue Eliquis 5 mg twice a day.  5. OSA - Continue CPAP qHS.  Followup 2 months.   [name] 03/07/2018

## 2018-03-11 ENCOUNTER — Encounter (HOSPITAL_COMMUNITY): Payer: Self-pay

## 2018-03-18 DIAGNOSIS — M19012 Primary osteoarthritis, left shoulder: Secondary | ICD-10-CM | POA: Diagnosis not present

## 2018-03-20 ENCOUNTER — Encounter: Payer: Self-pay | Admitting: Internal Medicine

## 2018-03-20 ENCOUNTER — Ambulatory Visit (INDEPENDENT_AMBULATORY_CARE_PROVIDER_SITE_OTHER): Payer: Medicare Other | Admitting: Internal Medicine

## 2018-03-20 VITALS — BP 116/82 | HR 72 | Ht 64.0 in | Wt 199.4 lb

## 2018-03-20 DIAGNOSIS — N390 Urinary tract infection, site not specified: Secondary | ICD-10-CM | POA: Diagnosis not present

## 2018-03-20 DIAGNOSIS — I5022 Chronic systolic (congestive) heart failure: Secondary | ICD-10-CM | POA: Diagnosis not present

## 2018-03-20 DIAGNOSIS — I4819 Other persistent atrial fibrillation: Secondary | ICD-10-CM

## 2018-03-20 DIAGNOSIS — M19012 Primary osteoarthritis, left shoulder: Secondary | ICD-10-CM | POA: Diagnosis not present

## 2018-03-20 DIAGNOSIS — I97190 Other postprocedural cardiac functional disturbances following cardiac surgery: Secondary | ICD-10-CM

## 2018-03-20 DIAGNOSIS — R001 Bradycardia, unspecified: Secondary | ICD-10-CM | POA: Diagnosis not present

## 2018-03-20 DIAGNOSIS — I442 Atrioventricular block, complete: Secondary | ICD-10-CM

## 2018-03-20 DIAGNOSIS — Z950 Presence of cardiac pacemaker: Secondary | ICD-10-CM

## 2018-03-20 DIAGNOSIS — R300 Dysuria: Secondary | ICD-10-CM | POA: Diagnosis not present

## 2018-03-20 MED ORDER — POTASSIUM CHLORIDE ER 20 MEQ PO TBCR: 20.0000 meq | EXTENDED_RELEASE_TABLET | Freq: Every day | ORAL | 0 refills | Status: DC

## 2018-03-20 NOTE — Patient Instructions (Signed)
Medication Instructions:  Your physician has recommended you make the following change in your medication:   1. Take Potassium Chloride supplements  Labwork: Your physician recommends you have a BMP drawn in 7 days.  Testing/Procedures: None ordered.  Follow-Up: Your physician recommends that you schedule a follow-up appointment in:   6 months with Dr Caryl Comes   Any Other Special Instructions Will Be Listed Below (If Applicable).     If you need a refill on your cardiac medications before your next appointment, please call your pharmacy.

## 2018-03-20 NOTE — Progress Notes (Signed)
Patient Care Team: Glenda Chroman, MD as PCP - General (Internal Medicine) Arnoldo Lenis, MD as PCP - Cardiology (Cardiology)   HPI  Gina [name] is a 78 y.o. female seen in follow-up for atrial fibrillation with significant LA, nonischemic cardiomyopathy and left bundle branch block.  She underwent CRT-  St Jude  and AV junction ablation. Unable to place LV lead  Presented with cardiogenic shock 7/19 requiring milrinone and norepinephrine.  She has been doing somewhat better.  More recently she has had more dyspnea on exertion and perhaps abdominal swelling.  She describes bendopnea.  Also with some orthopnea . Efforts at diuresis have been complicated by renal insufficiency  DATE TEST EF   6/17 LHC     % CAs normal  7/18 Echo   35 %   7/19 Echo  20% LVH - mod LAE-severe    Date Cr K Hgb  7/19 2.44 3.8 11.4  1/20 2.45 3.3 10.3      Records and Results Reviewed CHF clinic notes   Past Medical History:  Diagnosis Date  . Acute renal failure [facility])     hospitalized... December 08, 2009... improved with hydration in the hospital  . Anemia    further workup needed... October, 2011.  [name] Aortic insufficiency    mild...echo... June 2 010  . Arthritis    Hips/Knees, severe, requiring a walker  . Asymmetric septal hypertrophy (HCC)    echo....04/2009....patient encouraged the family to be screened elsewhere  . Atrial fibrillation ([place])     ??? atrial fibrillation during hospitalization October, 2011 ???..  . Bradycardia   . CAD (coronary artery disease)    Mild coronary disease, catheterization, 2009  . Cardiomyopathy, nonischemic ([place])    EF improved October, 2011  . Ejection fraction    EF 25%...nondiagnostic MRI December 2009...  /  echo June, 2010  /   echo... December 08, 2009.... ejection fraction improved... EF 60% /  EF 30%... echo.... [place]... December 28, 2009 with CHFPLanned ICD / CRT... patient has seen Dr.Kenzie Flakes..EF improved October, 2011   . Groin  hematoma    Right-hematoma/abscess..repaired.. December, 2009  . Hypertension   . LBBB (left bundle branch block)   . LVH (left ventricular hypertrophy)    moderately severe... echo.. June, 2010  /  EF improved October, 2011.... cancel plans for ICD  . Mitral regurgitation    mild/ moderate...echo June, 2010  /   echo.. October, 2011.... mitral regurgitation  improved  . OSA (obstructive sleep apnea)   . Renal artery stenosis (HCC)    80% upper left   . Systolic heart failure    chronic    Past Surgical History:  Procedure Laterality Date  . ABDOMINAL HYSTERECTOMY    . AV NODE ABLATION N/A 09/19/2017   Procedure: AV NODE ABLATION;  Surgeon: Deboraha Sprang, MD;  Location: [facility] CV LAB;  Service: Cardiovascular;  Laterality: N/A;  . BIV PACEMAKER INSERTION CRT-P N/A 09/19/2017   Procedure: BIV PACEMAKER INSERTION CRT-P;  Surgeon: Deboraha Sprang, MD;  Location: [facility] CV LAB;  Service: Cardiovascular;  Laterality: N/A;  . CARDIAC CATHETERIZATION N/A 08/14/2015   Procedure: Right/Left Heart Cath and Coronary Angiography;  Surgeon: Leonie Man, MD;  Location: [place] CV LAB;  Service: Cardiovascular;  Laterality: N/A;  . CARDIOVERSION N/A 09/11/2017   Procedure: CARDIOVERSION;  Surgeon: Larey Dresser, MD;  Location: [facility] ENDOSCOPY;  Service: Cardiovascular;  Laterality: N/A;  . CARDIOVERSION  N/A 09/14/2017   Procedure: CARDIOVERSION;  Surgeon: Larey Dresser, MD;  Location: [facility] OR;  Service: Cardiovascular;  Laterality: N/A;  . CARDIOVERSION N/A 09/18/2017   Procedure: CARDIOVERSION FOR ATRIAL FIBRILLATION;  Surgeon: Larey Dresser, MD;  Location: [facility] ENDOSCOPY;  Service: Cardiovascular;  Laterality: N/A;  . REPLACEMENT TOTAL HIP W/  RESURFACING IMPLANTS    . REPLACEMENT TOTAL KNEE    . Surgical Repair of a Catheteriztion associated femoral artery injury      Current Meds  Medication Sig  . acetaminophen (TYLENOL) 325 MG tablet Take 2 tablets (650 mg total) by mouth  every 4 (four) hours as needed for headache or mild pain.  [name] apixaban (ELIQUIS) 5 MG TABS tablet Take 1 tablet (5 mg total) by mouth 2 (two) times daily.  . carvedilol (COREG) 6.25 MG tablet Take 1 tablet (6.25 mg total) by mouth 2 (two) times daily.  . metolazone (ZAROXOLYN) 2.5 MG tablet Take 1 tablet (2.5 mg total) by mouth once a week. Wednesday mornings.  . Multiple Vitamin (MULTIVITAMIN) tablet Take 1 tablet by mouth daily.  [name] spironolactone (ALDACTONE) 25 MG tablet Take 1 tablet (25 mg total) by mouth daily.  [name] torsemide (DEMADEX) 20 MG tablet Take 2 tablets (40 mg total) by mouth daily.    Allergies  Allergen Reactions  . Cephalexin Rash    REACTION: Rash to arms/legs      Review of Systems negative except from HPI and PMH  Physical Exam BP 116/82   Pulse 72   Ht 5\' 4"  (1.626 m)   Wt 199 lb 6.4 oz (90.4 kg)   SpO2 94%   BMI 34.23 kg/m  Well developed and well nourished in no acute distress HENT normal E scleral and icterus clear Neck Supple JVP > 10 carotids brisk and full Clear to ausculation  Irregular rate and rhythm, no murmurs gallops or rub Soft with active bowel sounds No clubbing cyanosis Trace Edema Alert and oriented, grossly normal motor and sensory function Skin Warm and Dry  ECG AFib with V pacing   PVCs freq   intervals-/24/54  Assessment and  Plan  Atrial fibrillation-persistent with a rapid ventricular response  AV block  Iatrogenic ablation  Pacemaker CRT with plugged atrial port and failed attempt at LV lead placement  Nonischemic cardiomyopathy  Left bundle branch block  Congestive heart failure- chronic-class III  Renal insufficiency grade 4  PVCs  Hypokalemia   Patient's situation is complicated.  She continues to have heart failure symptoms with ventricular pacing and a very broad QRS duration (greater than 225 ms).  Hence, consideration of another effort at resynchronization is appropriate.  Prior to sending her for  epicardial lead placement, I would ask Dr. Elliot Cousin to attempt endovascular lead placement and/or His Lead placement.  She also has significant ventricular ectopy however, and this may be contributing to her heart failure.  Have raised with her the possibility of antiarrhythmic suppression of her ectopy and assessment of functional status and LV function thereafter.  As a noninvasive option I think this is the preferred I have reached out to Dr. DM but had not yet heard back.  He is hypokalemic.  We will replete her potassium for 3 days and check a metabolic profile; with her renal insufficiency and reluctant to try magnesium.  Notably also her GFR is borderline ongoing use of Aldactone  Current medicines are reviewed at length with the patient today .  The patient does not  have concerns regarding medicines.

## 2018-03-30 DIAGNOSIS — I5022 Chronic systolic (congestive) heart failure: Secondary | ICD-10-CM | POA: Diagnosis not present

## 2018-03-30 DIAGNOSIS — I442 Atrioventricular block, complete: Secondary | ICD-10-CM | POA: Diagnosis not present

## 2018-03-30 DIAGNOSIS — Z950 Presence of cardiac pacemaker: Secondary | ICD-10-CM | POA: Diagnosis not present

## 2018-03-30 DIAGNOSIS — I4819 Other persistent atrial fibrillation: Secondary | ICD-10-CM | POA: Diagnosis not present

## 2018-04-02 DIAGNOSIS — H5213 Myopia, bilateral: Secondary | ICD-10-CM | POA: Diagnosis not present

## 2018-04-02 DIAGNOSIS — H353131 Nonexudative age-related macular degeneration, bilateral, early dry stage: Secondary | ICD-10-CM | POA: Diagnosis not present

## 2018-04-02 DIAGNOSIS — H2513 Age-related nuclear cataract, bilateral: Secondary | ICD-10-CM | POA: Diagnosis not present

## 2018-04-07 DIAGNOSIS — I509 Heart failure, unspecified: Secondary | ICD-10-CM | POA: Diagnosis not present

## 2018-04-07 DIAGNOSIS — I10 Essential (primary) hypertension: Secondary | ICD-10-CM | POA: Diagnosis not present

## 2018-04-07 DIAGNOSIS — M159 Polyosteoarthritis, unspecified: Secondary | ICD-10-CM | POA: Diagnosis not present

## 2018-04-14 DIAGNOSIS — D631 Anemia in chronic kidney disease: Secondary | ICD-10-CM | POA: Diagnosis not present

## 2018-04-14 DIAGNOSIS — E559 Vitamin D deficiency, unspecified: Secondary | ICD-10-CM | POA: Diagnosis not present

## 2018-04-14 DIAGNOSIS — I129 Hypertensive chronic kidney disease with stage 1 through stage 4 chronic kidney disease, or unspecified chronic kidney disease: Secondary | ICD-10-CM | POA: Diagnosis not present

## 2018-04-14 DIAGNOSIS — N184 Chronic kidney disease, stage 4 (severe): Secondary | ICD-10-CM | POA: Diagnosis not present

## 2018-04-14 DIAGNOSIS — Z79899 Other long term (current) drug therapy: Secondary | ICD-10-CM | POA: Diagnosis not present

## 2018-04-20 DIAGNOSIS — R05 Cough: Secondary | ICD-10-CM | POA: Diagnosis not present

## 2018-04-20 DIAGNOSIS — R062 Wheezing: Secondary | ICD-10-CM | POA: Diagnosis not present

## 2018-04-20 DIAGNOSIS — J019 Acute sinusitis, unspecified: Secondary | ICD-10-CM | POA: Diagnosis not present

## 2018-04-20 DIAGNOSIS — R0600 Dyspnea, unspecified: Secondary | ICD-10-CM | POA: Diagnosis not present

## 2018-04-20 DIAGNOSIS — J209 Acute bronchitis, unspecified: Secondary | ICD-10-CM | POA: Diagnosis not present

## 2018-04-27 DIAGNOSIS — H40003 Preglaucoma, unspecified, bilateral: Secondary | ICD-10-CM | POA: Diagnosis not present

## 2018-04-27 DIAGNOSIS — H2511 Age-related nuclear cataract, right eye: Secondary | ICD-10-CM | POA: Diagnosis not present

## 2018-04-27 DIAGNOSIS — H2513 Age-related nuclear cataract, bilateral: Secondary | ICD-10-CM | POA: Diagnosis not present

## 2018-05-05 DIAGNOSIS — E875 Hyperkalemia: Secondary | ICD-10-CM | POA: Diagnosis not present

## 2018-05-05 DIAGNOSIS — I10 Essential (primary) hypertension: Secondary | ICD-10-CM | POA: Diagnosis not present

## 2018-05-05 DIAGNOSIS — D649 Anemia, unspecified: Secondary | ICD-10-CM | POA: Diagnosis not present

## 2018-05-05 DIAGNOSIS — N184 Chronic kidney disease, stage 4 (severe): Secondary | ICD-10-CM | POA: Diagnosis not present

## 2018-05-13 ENCOUNTER — Ambulatory Visit (HOSPITAL_COMMUNITY)
Admission: RE | Admit: 2018-05-13 | Discharge: 2018-05-13 | Disposition: A | Discharge status: Home / Self Care | Payer: Medicare Other | Source: Ambulatory Visit | Attending: Cardiology | Admitting: Cardiology

## 2018-05-13 ENCOUNTER — Other Ambulatory Visit: Payer: Self-pay

## 2018-05-13 ENCOUNTER — Encounter (HOSPITAL_COMMUNITY): Payer: Self-pay | Admitting: Cardiology

## 2018-05-13 VITALS — BP 130/90 | HR 73 | Wt 195.2 lb

## 2018-05-13 DIAGNOSIS — Z79899 Other long term (current) drug therapy: Secondary | ICD-10-CM | POA: Diagnosis not present

## 2018-05-13 DIAGNOSIS — I428 Other cardiomyopathies: Secondary | ICD-10-CM | POA: Insufficient documentation

## 2018-05-13 DIAGNOSIS — I5022 Chronic systolic (congestive) heart failure: Secondary | ICD-10-CM | POA: Insufficient documentation

## 2018-05-13 DIAGNOSIS — I4819 Other persistent atrial fibrillation: Secondary | ICD-10-CM

## 2018-05-13 DIAGNOSIS — I5042 Chronic combined systolic (congestive) and diastolic (congestive) heart failure: Secondary | ICD-10-CM

## 2018-05-13 DIAGNOSIS — Z87891 Personal history of nicotine dependence: Secondary | ICD-10-CM | POA: Insufficient documentation

## 2018-05-13 DIAGNOSIS — N184 Chronic kidney disease, stage 4 (severe): Secondary | ICD-10-CM | POA: Insufficient documentation

## 2018-05-13 DIAGNOSIS — G4733 Obstructive sleep apnea (adult) (pediatric): Secondary | ICD-10-CM | POA: Insufficient documentation

## 2018-05-13 DIAGNOSIS — I130 Hypertensive heart and chronic kidney disease with heart failure and stage 1 through stage 4 chronic kidney disease, or unspecified chronic kidney disease: Secondary | ICD-10-CM | POA: Insufficient documentation

## 2018-05-13 DIAGNOSIS — D649 Anemia, unspecified: Secondary | ICD-10-CM | POA: Insufficient documentation

## 2018-05-13 DIAGNOSIS — I701 Atherosclerosis of renal artery: Secondary | ICD-10-CM | POA: Diagnosis not present

## 2018-05-13 DIAGNOSIS — I4821 Permanent atrial fibrillation: Secondary | ICD-10-CM | POA: Diagnosis not present

## 2018-05-13 DIAGNOSIS — I447 Left bundle-branch block, unspecified: Secondary | ICD-10-CM | POA: Insufficient documentation

## 2018-05-13 DIAGNOSIS — Z8249 Family history of ischemic heart disease and other diseases of the circulatory system: Secondary | ICD-10-CM | POA: Insufficient documentation

## 2018-05-13 DIAGNOSIS — Z950 Presence of cardiac pacemaker: Secondary | ICD-10-CM | POA: Diagnosis not present

## 2018-05-13 DIAGNOSIS — Z7901 Long term (current) use of anticoagulants: Secondary | ICD-10-CM | POA: Insufficient documentation

## 2018-05-13 LAB — COMPREHENSIVE METABOLIC PANEL
ALT: 26 U/L (ref 0–44)
AST: 22 U/L (ref 15–41)
Albumin: 3.5 g/dL (ref 3.5–5.0)
Alkaline Phosphatase: 58 U/L (ref 38–126)
Anion gap: 10 (ref 5–15)
BUN: 92 mg/dL — ABNORMAL HIGH (ref 8–23)
CO2: 28 mmol/L (ref 22–32)
Calcium: 9.3 mg/dL (ref 8.9–10.3)
Chloride: 100 mmol/L (ref 98–111)
Creatinine, Ser: 2.69 mg/dL — ABNORMAL HIGH (ref 0.44–1.00)
GFR calc Af Amer: 19 mL/min — ABNORMAL LOW (ref >60)
GFR calc non Af Amer: 16 mL/min — ABNORMAL LOW (ref >60)
Glucose, Bld: 119 mg/dL — ABNORMAL HIGH (ref 70–99)
Potassium: 3.2 mmol/L — ABNORMAL LOW (ref 3.5–5.1)
Sodium: 138 mmol/L (ref 135–145)
Total Bilirubin: 1.4 mg/dL — ABNORMAL HIGH (ref 0.3–1.2)
Total Protein: 6.2 g/dL — ABNORMAL LOW (ref 6.5–8.1)

## 2018-05-13 LAB — MAGNESIUM: Magnesium: 2.6 mg/dL — ABNORMAL HIGH (ref 1.7–2.4)

## 2018-05-13 MED ORDER — AMIODARONE HCL 200 MG PO TABS: 200.0000 mg | ORAL_TABLET | Freq: Every day | ORAL | 3 refills | Status: DC

## 2018-05-13 MED ORDER — CARVEDILOL 6.25 MG PO TABS: 9.3700 mg | ORAL_TABLET | Freq: Two times a day (BID) | ORAL | 11 refills | Status: DC

## 2018-05-13 NOTE — Patient Instructions (Signed)
Lab work done today. We will contact you for any abnormal lab work.  Lab work will need to be done again. You have been given a prescription for this.  START Amiodarone 200mg  (1 tab) twice daily FOR SEVEN DAYS ONLY. THEN START Amiodarone 200mg  (1 tab) daily  INCREASE Carvedilol 9.375mg  (1.5 tab) twice daily.  You have been referred to Dr. Lovena Le. His office will contact you for an appointment.  Follow up with the Advanced Practice Provider in 1 month.

## 2018-05-13 NOTE — Progress Notes (Signed)
PCP: Dr Woody Seller  HF Cardiology: Dr Aundra Dubin  Neprology: Dr Hinda Lenis EP: Dr Caryl Comes   HPI: Gina [name] is a 78 y.o.female with h/o chronic systolic CHF, anemia, HTN, LBBB, renal artery stenosis, and OSA.   Admitted 09/02/2017 with peripheral edema and worsening SOB. Attempts at diuresis were difficult due to AKI. Transferred to [facility] 09/10/17 in setting of cardiogenic shock 2/2 Afib RVR. She required milrinone.  She underwent DCCV 7/11 and 7/14. Discussed with EP after she went back into Afib a third time. Underwent DCCV 7/18 and stayed in for NSR most of the day, but then went back into Afib. Underwent AV nodal ablation and St Jude PPM implantation 09/19/17 with Dr. Caryl Comes. Unable to place an LV lead. She was discharged to SNF. Last echo in 7/19 with EF 20%.    Today she returns for HF follow up.Overall feeling fine. Mild dyspnea with steps. Denies PND/Orthopnea. Appetite ok. No fever or chills. Weight at home 185-189 pounds. No bleeding issues. Taking all medications but has only been taking 20 mg torsemide a day.   ECG (personally reviewed): atrial fibrillation with RV pacing, QRS 238 msec with PVCs  Labs (1/20): K 3.3, creatinine 2.45  St Jude device interrogation: 90% RV pacing, decreased thoracic impedance.   PMH: 1. OSA: Uses CPAP.  2. Atrial fibrillation: permanent.  Now s/p AV nodal ablation with St Jude PPM.  3. Chronic systolic CHF: Since 6962, nonischemic cardiomyopathy.  - LHC (2017): Normal coronaries.  - Echo (7/19): EF 20% with mild LV dilation and diffuse hypokinesis, mild RV dilation with mild to moderate systolic dysfunction.  - Low output HF in 7/19 requiring milrinone.  - Unable to place LV lead at time of AV nodal ablation and pacing.  4. CKD stage 3-4.  5. H/o LBBB 6. Renal artery stenosis: 80% on left.  7. Frequent PVCs  Review of systems complete and found to be negative unless listed in HPI.   Social History   Socioeconomic History  . Marital status: Married   Spouse name: Not on file  . Number of children: 0  . Years of education: Not on file  . Highest education level: Not on file  Occupational History  . Not on file  Social Needs  . Financial resource strain: Not on file  . Food insecurity:    Worry: Not on file    Inability: Not on file  . Transportation needs:    Medical: Not on file    Non-medical: Not on file  Tobacco Use  . Smoking status: Former Smoker    Packs/day: 1.00    Years: 24.00    Pack years: 24.00    Types: Cigarettes    Start date: 10/29/1958    Last attempt to quit: 12/02/1985    Years since quitting: 32.4  . Smokeless tobacco: Never Used  Substance and Sexual Activity  . Alcohol use: No    Alcohol/week: 0.0 standard drinks  . Drug use: Not on file  . Sexual activity: Not on file  Lifestyle  . Physical activity:    Days per week: Not on file    Minutes per session: Not on file  . Stress: Not on file  Relationships  . Social connections:    Talks on phone: Not on file    Gets together: Not on file    Attends religious service: Not on file    Active member of club or organization: Not on file    Attends meetings of clubs  or organizations: Not on file    Relationship status: Not on file  . Intimate partner violence:    Fear of current or ex partner: Not on file    Emotionally abused: Not on file    Physically abused: Not on file    Forced sexual activity: Not on file  Other Topics Concern  . Not on file  Social History Narrative  . Not on file    Family History  Problem Relation Age of Onset  . Rheumatic fever Mother        in her early 27's  . Alzheimer's disease Mother   . Stomach cancer Father 49       died 90  . Bradycardia Brother        has pacemaker  . Heart failure Paternal Grandmother   . Heart failure Paternal Grandfather     Current Outpatient Medications  Medication Sig Dispense Refill  . acetaminophen (TYLENOL) 325 MG tablet Take 2 tablets (650 mg total) by mouth every 4  (four) hours as needed for headache or mild pain.    [name] apixaban (ELIQUIS) 5 MG TABS tablet Take 1 tablet (5 mg total) by mouth 2 (two) times daily. 60 tablet 3  . carvedilol (COREG) 6.25 MG tablet Take 1 tablet (6.25 mg total) by mouth 2 (two) times daily. 60 tablet 11  . metolazone (ZAROXOLYN) 2.5 MG tablet Take 1 tablet (2.5 mg total) by mouth once a week. Wednesday mornings. 5 tablet 11  . Multiple Vitamin (MULTIVITAMIN) tablet Take 1 tablet by mouth daily.    [name] spironolactone (ALDACTONE) 25 MG tablet Take 1 tablet (25 mg total) by mouth daily. 90 tablet 3  . torsemide (DEMADEX) 20 MG tablet Take 2 tablets (40 mg total) by mouth daily. 180 tablet 2  . potassium chloride 20 MEQ TBCR Take 20 mEq by mouth daily for 3 days. 10 tablet 0   No current facility-administered medications for this encounter.     Vitals:   05/13/18 1158  BP: 130/90  Pulse: 73  SpO2: 98%  Weight: 88.5 kg (195 lb 3.2 oz)   Wt Readings from Last 3 Encounters:  05/13/18 88.5 kg (195 lb 3.2 oz)  03/20/18 90.4 kg (199 lb 6.4 oz)  03/05/18 87.8 kg (193 lb 9.6 oz)   PHYSICAL EXAM: General:  Well appearing. No resp difficulty HEENT: normal Neck: supple. JVP ~10 . Carotids 2+ bilat; no bruits. No lymphadenopathy or thryomegaly appreciated. Cor: PMI nondisplaced. Regular rate & rhythm. No rubs, gallops or murmurs. Lungs: clear Abdomen: soft, nontender, nondistended. No hepatosplenomegaly. No bruits or masses. Good bowel sounds. Extremities: no cyanosis, clubbing, rash, edema Neuro: alert & orientedx3, cranial nerves grossly intact. moves all 4 extremities w/o difficulty. Affect pleasant  ASSESSMENT & PLAN: 1. Chronic Systolic CHF: Echo (4/09) with EF 20%. Long-standing cardiomyopathy, nonischemic based on cath in 2017. 7/19 hospitalization withlow output HF likely triggered by atrial fibrillation/RVR.  She was started on milrinone.  DCCVs failed and she ultimately had AV nodal ablation with St Jude PPM placed  (unable to place LV lead).  Exam and Corevue are suggestive of volume overload.  - Increase torsemide 40 mg daily (she has only been taking 20 mg daily) +  spironolactone 25 daily.    - Continue metolazone 2.5 q Wed.   - Increase Coreg to 9.375 mg twice a day.   - Continue spironolactone 25 mg daily.  - No ACEI/ARNI/ARB with elevated creatinine.  - 90% RV pacing. Saw Dr. Caryl Comes  for re-attempting LV lead placement => followup with Dr  Lovena Le for possible LV lead placement versus His lead.  2.CKD stage 3: Check renal function today.  3. Atrial fibrillation: Chronic.   She is now s/p AV nodal ablation with St Jude PPM.  - Continue Eliquis 5 mg twice a day.  5. OSA - Continue CPAP qHS. 5. PVCs: Frequent.  Add amiodarone 200 mg twice a day x 7 days then 200 mg daily.  Discussed side effects. We will monitor liver function, PFTs, TSH, and eye exams.  Plan to repeat ECHO after she has been on amiodarone.   Follow up 4-6 week with APP. Will need to get EKG and BMET   Gina Clegg NP-C  05/13/2018   Patient seen with NP, agree with the above note.   Mild volume overload on exam and by device interrogation.  - Increase torsemide to 40 mg daily with BMET today and in 10 days.  - Increase Coreg to 9.375 mg bid.  - Refer to Dr. Lovena Le for re-attempt LV lead placement versus His bundle lead (per Dr. Caryl Comes).  She paces 90% of the time due to AV nodal ablation.   Frequent PVCs: May contribute to cardiomyopathy.  - Start amiodarone 200 mg bid x 10 days, then 200 mg daily.   Followup in 1 month with APP.   [name] 05/14/2018

## 2018-05-19 DIAGNOSIS — H2512 Age-related nuclear cataract, left eye: Secondary | ICD-10-CM | POA: Diagnosis not present

## 2018-05-19 DIAGNOSIS — H2511 Age-related nuclear cataract, right eye: Secondary | ICD-10-CM | POA: Diagnosis not present

## 2018-05-22 ENCOUNTER — Telehealth (HOSPITAL_COMMUNITY): Payer: Self-pay

## 2018-05-22 MED ORDER — POTASSIUM CHLORIDE CRYS ER 20 MEQ PO TBCR: 20.0000 meq | EXTENDED_RELEASE_TABLET | Freq: Every day | ORAL | 3 refills | Status: DC

## 2018-05-22 NOTE — Telephone Encounter (Signed)
K 3.2  Spoke with patient, aware of lab results. Has no current complaints.  Pt will start K 58meq daily. Verbalized understanding.

## 2018-05-22 NOTE — Telephone Encounter (Signed)
-----   Message from Larey Dresser, MD sent at 05/15/2018 12:03 AM EDT ----- Low K, carefully add KCl 20 daily.

## 2018-05-25 DIAGNOSIS — H2511 Age-related nuclear cataract, right eye: Secondary | ICD-10-CM | POA: Diagnosis not present

## 2018-06-02 DIAGNOSIS — H2511 Age-related nuclear cataract, right eye: Secondary | ICD-10-CM | POA: Diagnosis not present

## 2018-06-02 DIAGNOSIS — H2512 Age-related nuclear cataract, left eye: Secondary | ICD-10-CM | POA: Diagnosis not present

## 2018-06-05 ENCOUNTER — Telehealth (HOSPITAL_COMMUNITY): Payer: Self-pay | Admitting: Adult Health

## 2018-06-05 NOTE — Telephone Encounter (Signed)
Attempted to contact PT re: tele-health options for visit scheduled for 4/9left v/m.  Will continue to attempt to contact PT.

## 2018-06-09 NOTE — Progress Notes (Signed)
Heart Failure TeleHealth Note  Due to national recommendations of social distancing due to [place] 19, telehealth visit is felt to be most appropriate for this patient at this time.  I discussed the limitations, risks, security and privacy concerns of performing an evaluation and management service by telephone and the availability of in person appointments. I also discussed with the patient that there may be a patient responsible charge related to this service. The patient expressed understanding and agreed to proceed.   ID:  Gina [name], Gina [name] April 03, 1940, MRN [id number]  Location: Home  Provider location: [address], [place] Type of Visit: Established patient  PCP:  Glenda Chroman, MD  Cardiologist:  Carlyle Dolly, MD Primary HF: Dr Aundra Dubin  Chief Complaint: Chronic systolic HF   History of Present Illness: Gina [name] a 78 y.o.femalewith h/o chronic systolic CHF, anemia, HTN, LBBB, renal artery stenosis, atrial fibrillation s/p DCCV s/p AV nodal ablation 09/2017, s/p St Jude PPM (unable to place LV lead), and OSA on CPAP.   Seen in clinic 05/13/18 with Dr Aundra Dubin. Coreg and torsemide were increased. She was started on amiodarone for frequent PVCs and was referred to Dr Lovena Le to re-attempt LV lead or His bundle lead placement due to 90% RV pacing.   She presents via Psychiatric nurse for a telehealth visit today. Overall doing well. Denies SOB. No edema, orthopnea, or PND. No bleeding on Eliquis. Great UOP with torsemide. Denies palpitations (did not feel before). Appetite great. Energy level improved. Foot cramps resolved. Wearing CPAP qHS. No problems getting all medications. Limits fluid and salt intake. Has a tentative appointment on 5/5 with Dr Lovena Le.  O2: 97% HR: 72 Weights: 186 lbs (down from 189 lbs).  Corvue: 91% RV pacing. Thoracic impedence at threshold (had volume early march, but improved). Unable to quantify PVCs. Discussed with rep.   Pt denies  symptoms of cough, fevers, chills, or new SOB worrisome for COVID 19.    Past Medical History:  Diagnosis Date  . Acute renal failure [facility])     hospitalized... December 08, 2009... improved with hydration in the hospital  . Anemia    further workup needed... October, 2011.  [name] Aortic insufficiency    mild...echo... June 2 010  . Arthritis    Hips/Knees, severe, requiring a walker  . Asymmetric septal hypertrophy (HCC)    echo....04/2009....patient encouraged the family to be screened elsewhere  . Atrial fibrillation ([place])     ??? atrial fibrillation during hospitalization October, 2011 ???..  . Bradycardia   . CAD (coronary artery disease)    Mild coronary disease, catheterization, 2009  . Cardiomyopathy, nonischemic ([place])    EF improved October, 2011  . Ejection fraction    EF 25%...nondiagnostic MRI December 2009...  /  echo June, 2010  /   echo... December 08, 2009.... ejection fraction improved... EF 60% /  EF 30%... echo.... [place]... December 28, 2009 with CHFPLanned ICD / CRT... patient has seen Dr.Klein..EF improved October, 2011   . Groin hematoma    Right-hematoma/abscess..repaired.. December, 2009  . Hypertension   . LBBB (left bundle branch block)   . LVH (left ventricular hypertrophy)    moderately severe... echo.. June, 2010  /  EF improved October, 2011.... cancel plans for ICD  . Mitral regurgitation    mild/ moderate...echo June, 2010  /   echo.. October, 2011.... mitral regurgitation  improved  . OSA (obstructive sleep apnea)   . Renal artery stenosis ([place])  80% upper left   . Systolic heart failure    chronic   Past Surgical History:  Procedure Laterality Date  . ABDOMINAL HYSTERECTOMY    . AV NODE ABLATION N/A 09/19/2017   Procedure: AV NODE ABLATION;  Surgeon: Deboraha Sprang, MD;  Location: [place] CV LAB;  Service: Cardiovascular;  Laterality: N/A;  . BIV PACEMAKER INSERTION CRT-P N/A 09/19/2017   Procedure: BIV PACEMAKER INSERTION CRT-P;  Surgeon:  Deboraha Sprang, MD;  Location: [place] [facility];  Service: Cardiovascular;  Laterality: N/A;  . CARDIAC CATHETERIZATION N/A 08/14/2015   Procedure: Right/Left Heart Cath and Coronary Angiography;  Surgeon: Leonie Man, MD;  Location: [place] [facility];  Service: Cardiovascular;  Laterality: N/A;  . CARDIOVERSION N/A 09/11/2017   Procedure: CARDIOVERSION;  Surgeon: Larey Dresser, MD;  Location: [facility] ENDOSCOPY;  Service: Cardiovascular;  Laterality: N/A;  . CARDIOVERSION N/A 09/14/2017   Procedure: CARDIOVERSION;  Surgeon: Larey Dresser, MD;  Location: [place];  Service: Cardiovascular;  Laterality: N/A;  . CARDIOVERSION N/A 09/18/2017   Procedure: CARDIOVERSION FOR ATRIAL FIBRILLATION;  Surgeon: Larey Dresser, MD;  Location: [facility] ENDOSCOPY;  Service: Cardiovascular;  Laterality: N/A;  . REPLACEMENT TOTAL HIP W/  RESURFACING IMPLANTS    . REPLACEMENT TOTAL KNEE    . Surgical Repair of a Catheteriztion associated femoral artery injury       Current Outpatient Medications  Medication Sig Dispense Refill  . acetaminophen (TYLENOL) 325 MG tablet Take 2 tablets (650 mg total) by mouth every 4 (four) hours as needed for headache or mild pain.    [name] amiodarone (PACERONE) 200 MG tablet Take 1 tablet (200 mg total) by mouth daily. 60 tablet 3  . apixaban (ELIQUIS) 5 MG TABS tablet Take 1 tablet (5 mg total) by mouth 2 (two) times daily. 60 tablet 3  . carvedilol (COREG) 6.25 MG tablet Take 1.5 tablets (9.37 mg total) by mouth 2 (two) times daily. 75 tablet 11  . metolazone (ZAROXOLYN) 2.5 MG tablet Take 1 tablet (2.5 mg total) by mouth once a week. Wednesday mornings. 5 tablet 11  . Multiple Vitamin (MULTIVITAMIN) tablet Take 1 tablet by mouth daily.    . potassium chloride 20 MEQ TBCR Take 20 mEq by mouth daily for 3 days. 10 tablet 0  . potassium chloride SA (K-DUR,KLOR-CON) 20 MEQ tablet Take 1 tablet (20 mEq total) by mouth daily. 90 tablet 3  . spironolactone (ALDACTONE) 25 MG tablet Take 1  tablet (25 mg total) by mouth daily. 90 tablet 3  . torsemide (DEMADEX) 20 MG tablet Take 2 tablets (40 mg total) by mouth daily. 180 tablet 2   No current facility-administered medications for this encounter.     Allergies:   Cephalexin   Social History:  The patient  reports that she quit smoking about 32 years ago. Her smoking use included cigarettes. She started smoking about 59 years ago. She has a 24.00 pack-year smoking history. She has never used smokeless tobacco. She reports that she does not drink alcohol.   Family History:  The patient's family history includes Alzheimer's disease in her mother; Bradycardia in her brother; Heart failure in her paternal grandfather and paternal grandmother; Rheumatic fever in her mother; Stomach cancer (age of onset: 33) in her father.   ROS:  Please see the history of present illness.   All other systems are personally reviewed and negative.   Exam:  [facility] Call) Lungs: Normal respiratory effort with conversation.  Neuro:  Alert & oriented x 3.  Denies edema.   Recent Labs: 09/14/2017: TSH 3.162 10/01/2017: B Natriuretic Peptide >4,500.0 12/04/2017: Hemoglobin 10.3; Platelets 271 05/13/2018: ALT 26; BUN 92; Creatinine, Ser 2.69; Magnesium 2.6; Potassium 3.2; Sodium 138  Personally reviewed   Wt Readings from Last 3 Encounters:  05/13/18 88.5 kg (195 lb 3.2 oz)  03/20/18 90.4 kg (199 lb 6.4 oz)  03/05/18 87.8 kg (193 lb 9.6 oz)      ASSESSMENT AND PLAN:  1. Chronic systolic HF. NICM. - Echo 09/2017: EF 20% - NYHA II. Volume sounds okay. Improved on corvue.  - Continue torsemide 40 mg daily. She wants to delay getting labs now. See below. - Continue spiro 25 mg daily - Continue metolazone 2.5 mg on Wednesdays - Increase coreg to 12.5 mg BID - No ACEI/ARNI/ARB with elevated creatinine - 91% RV pacing. Saw Dr. Caryl Comes for re-attempting LV lead placement => needs to followup with Dr  Lovena Le for possible LV lead placement versus His  lead. Sees Dr Lovena Le on 5/5.  2. CKD3 - Creatinine 2.69 on labs 05/13/18. She would like to delay labwork right now with COVID crisis. There is no [facility] near here, her PCP is not close by, and she does not feel comfortable going to the hospital for [place].  3. Chronic Afib - S/p AV ablation with St Jude PPM - Continue eliquis 5 mg BID. Denies bleeding.   4. OSA - Continue CPAP  5. Frequent PVCs - Continue amiodarone 200 mg daily. Check CMET and TSH after COVID crisis is behind [place]. She usually gets [facility], but does not want to go to any hospital right now.  - Repeat echo in July to see if PVC suppression has improved EF.  - Send ziopatch to quantify PVCs on amiodarone. Unable to quantify by device.    COVID screen The patient does not have any symptoms that suggest any further testing/ screening at this time.  Social distancing reinforced today.  Patient Risk: After full review of this patients clinical status, I feel that they are at moderate risk for cardiac decompensation at this time.   Increase coreg to 12.5 mg BID. Send 3 day Ziopatch to quantify PVC burden now that she is on amiodarone. Follow up in 2-3 months with echo with Dr Aundra Dubin   Signed, Georgiana Shore, NP  06/10/2018 1:29 PM  Today, I have spent 18 minutes with the patient with telehealth technology discussing heart failure, PVCs, and medications.    [facility] [address] [place] [address] [phone] (office) [phone] (fax)

## 2018-06-10 ENCOUNTER — Ambulatory Visit (HOSPITAL_COMMUNITY)
Admission: RE | Admit: 2018-06-10 | Discharge: 2018-06-10 | Disposition: A | Discharge status: Home / Self Care | Payer: Medicare Other | Source: Ambulatory Visit | Attending: Internal Medicine | Admitting: Internal Medicine

## 2018-06-10 ENCOUNTER — Other Ambulatory Visit: Payer: Self-pay

## 2018-06-10 ENCOUNTER — Encounter (HOSPITAL_COMMUNITY): Payer: Self-pay

## 2018-06-10 DIAGNOSIS — N183 Chronic kidney disease, stage 3 unspecified: Secondary | ICD-10-CM

## 2018-06-10 DIAGNOSIS — I4819 Other persistent atrial fibrillation: Secondary | ICD-10-CM

## 2018-06-10 DIAGNOSIS — Z9989 Dependence on other enabling machines and devices: Secondary | ICD-10-CM

## 2018-06-10 DIAGNOSIS — I5022 Chronic systolic (congestive) heart failure: Secondary | ICD-10-CM | POA: Diagnosis not present

## 2018-06-10 DIAGNOSIS — G4733 Obstructive sleep apnea (adult) (pediatric): Secondary | ICD-10-CM

## 2018-06-10 DIAGNOSIS — I493 Ventricular premature depolarization: Secondary | ICD-10-CM | POA: Diagnosis not present

## 2018-06-10 MED ORDER — CARVEDILOL 12.5 MG PO TABS: 12.5000 mg | ORAL_TABLET | Freq: Two times a day (BID) | ORAL | 3 refills | Status: DC

## 2018-06-10 NOTE — Patient Instructions (Signed)
INCREASE Carvedilol 12.5mg  (1 tab) two times daily  You will be sent a ziopatch to place on yourself. Someone will be in contact with you about your shipment  Your physician has requested that you have an echocardiogram. Echocardiography is a painless test that uses sound waves to create images of your heart. It provides your doctor with information about the size and shape of your heart and how well your heart's chambers and valves are working. This procedure takes approximately one hour. There are no restrictions for this procedure. This will be done at your appointment in 2 months with Dr. Aundra Dubin.  Please follow up with Dr. Aundra Dubin in 2 months with an echocardiogram.

## 2018-06-10 NOTE — Addendum Note (Signed)
Encounter addended by: Marlise Eves, RN on: 06/10/2018 2:56 PM  Actions taken: Order list changed, Diagnosis association updated, Clinical Note Signed

## 2018-06-11 DIAGNOSIS — I10 Essential (primary) hypertension: Secondary | ICD-10-CM | POA: Diagnosis not present

## 2018-06-11 DIAGNOSIS — I509 Heart failure, unspecified: Secondary | ICD-10-CM | POA: Diagnosis not present

## 2018-06-11 DIAGNOSIS — M159 Polyosteoarthritis, unspecified: Secondary | ICD-10-CM | POA: Diagnosis not present

## 2018-06-16 ENCOUNTER — Ambulatory Visit: Payer: Medicare Other | Admitting: Internal Medicine

## 2018-06-19 ENCOUNTER — Other Ambulatory Visit: Payer: Self-pay

## 2018-06-19 ENCOUNTER — Ambulatory Visit (INDEPENDENT_AMBULATORY_CARE_PROVIDER_SITE_OTHER): Payer: Medicare Other | Admitting: *Deleted

## 2018-06-19 DIAGNOSIS — R001 Bradycardia, unspecified: Secondary | ICD-10-CM | POA: Diagnosis not present

## 2018-06-19 DIAGNOSIS — I480 Paroxysmal atrial fibrillation: Secondary | ICD-10-CM

## 2018-06-19 LAB — CUP PACEART REMOTE DEVICE CHECK
Battery Remaining Longevity: 118 mo
Battery Remaining Percentage: 95.5 %
Battery Voltage: 3.02 V
Brady Statistic RV Percent Paced: 91 %
Date Time Interrogation Session: 20200417060017
Implantable Lead Implant Date: 20190719
Implantable Lead Implant Date: 20190719
Implantable Lead Location: 753859
Implantable Lead Location: 753860
Implantable Lead Model: 5076
Implantable Lead Model: 5076
Implantable Pulse Generator Implant Date: 20190719
Lead Channel Impedance Value: 540 Ohm
Lead Channel Pacing Threshold Amplitude: 0.5 V
Lead Channel Pacing Threshold Pulse Width: 0.5 ms
Lead Channel Sensing Intrinsic Amplitude: 12 mV
Lead Channel Setting Pacing Amplitude: 2 V
Lead Channel Setting Pacing Pulse Width: 0.5 ms
Lead Channel Setting Sensing Sensitivity: 4 mV
Pulse Gen Model: 3222
Pulse Gen Serial Number: 9022392

## 2018-06-24 NOTE — Progress Notes (Signed)
Remote pacemaker transmission.   

## 2018-06-29 ENCOUNTER — Ambulatory Visit (HOSPITAL_COMMUNITY)
Admission: RE | Admit: 2018-06-29 | Discharge: 2018-06-29 | Disposition: A | Discharge status: Home / Self Care | Payer: Medicare Other | Source: Ambulatory Visit | Attending: Cardiology | Admitting: Cardiology

## 2018-06-29 ENCOUNTER — Other Ambulatory Visit: Payer: Self-pay

## 2018-06-29 DIAGNOSIS — I493 Ventricular premature depolarization: Secondary | ICD-10-CM | POA: Diagnosis not present

## 2018-06-29 NOTE — Addendum Note (Signed)
Encounter addended by: Scarlette Calico, RN on: 06/29/2018 1:57 PM  Actions taken: Diagnosis association updated, Order list changed

## 2018-07-01 ENCOUNTER — Telehealth: Payer: Self-pay | Admitting: Internal Medicine

## 2018-07-01 NOTE — Telephone Encounter (Signed)
New message    Called pt about appt on 05.05.20 with Dr. Lovena Le. Pt was not interested in a virtual visit. She said she has never seen Dr. Lovena Le and wouldn't know what to talk about over the phone. Pt was rescheduled to August 5th.

## 2018-07-07 ENCOUNTER — Ambulatory Visit: Payer: Medicare Other | Admitting: Internal Medicine

## 2018-07-12 ENCOUNTER — Other Ambulatory Visit (HOSPITAL_COMMUNITY): Payer: Self-pay | Admitting: Cardiology

## 2018-07-28 DIAGNOSIS — I509 Heart failure, unspecified: Secondary | ICD-10-CM | POA: Diagnosis not present

## 2018-07-28 DIAGNOSIS — I10 Essential (primary) hypertension: Secondary | ICD-10-CM | POA: Diagnosis not present

## 2018-07-28 DIAGNOSIS — M159 Polyosteoarthritis, unspecified: Secondary | ICD-10-CM | POA: Diagnosis not present

## 2018-08-25 DIAGNOSIS — I10 Essential (primary) hypertension: Secondary | ICD-10-CM | POA: Diagnosis not present

## 2018-08-25 DIAGNOSIS — I509 Heart failure, unspecified: Secondary | ICD-10-CM | POA: Diagnosis not present

## 2018-08-25 DIAGNOSIS — M159 Polyosteoarthritis, unspecified: Secondary | ICD-10-CM | POA: Diagnosis not present

## 2018-08-31 ENCOUNTER — Other Ambulatory Visit: Payer: Self-pay

## 2018-08-31 ENCOUNTER — Ambulatory Visit (HOSPITAL_BASED_OUTPATIENT_CLINIC_OR_DEPARTMENT_OTHER)
Admission: RE | Admit: 2018-08-31 | Discharge: 2018-08-31 | Disposition: A | Discharge status: Home / Self Care | Payer: Medicare Other | Source: Ambulatory Visit | Attending: Cardiology | Admitting: Cardiology

## 2018-08-31 ENCOUNTER — Encounter (HOSPITAL_COMMUNITY): Payer: Self-pay | Admitting: Cardiology

## 2018-08-31 ENCOUNTER — Ambulatory Visit (HOSPITAL_COMMUNITY)
Admission: RE | Admit: 2018-08-31 | Discharge: 2018-08-31 | Disposition: A | Discharge status: Home / Self Care | Payer: Medicare Other | Source: Ambulatory Visit | Attending: Cardiology | Admitting: Cardiology

## 2018-08-31 VITALS — BP 130/82 | HR 67 | Wt 198.0 lb

## 2018-08-31 DIAGNOSIS — I5022 Chronic systolic (congestive) heart failure: Secondary | ICD-10-CM

## 2018-08-31 DIAGNOSIS — I130 Hypertensive heart and chronic kidney disease with heart failure and stage 1 through stage 4 chronic kidney disease, or unspecified chronic kidney disease: Secondary | ICD-10-CM | POA: Insufficient documentation

## 2018-08-31 DIAGNOSIS — I080 Rheumatic disorders of both mitral and aortic valves: Secondary | ICD-10-CM | POA: Diagnosis not present

## 2018-08-31 DIAGNOSIS — Z8249 Family history of ischemic heart disease and other diseases of the circulatory system: Secondary | ICD-10-CM | POA: Insufficient documentation

## 2018-08-31 DIAGNOSIS — I4821 Permanent atrial fibrillation: Secondary | ICD-10-CM | POA: Diagnosis not present

## 2018-08-31 DIAGNOSIS — N184 Chronic kidney disease, stage 4 (severe): Secondary | ICD-10-CM | POA: Diagnosis not present

## 2018-08-31 DIAGNOSIS — I4819 Other persistent atrial fibrillation: Secondary | ICD-10-CM

## 2018-08-31 DIAGNOSIS — I447 Left bundle-branch block, unspecified: Secondary | ICD-10-CM | POA: Insufficient documentation

## 2018-08-31 DIAGNOSIS — I428 Other cardiomyopathies: Secondary | ICD-10-CM | POA: Diagnosis not present

## 2018-08-31 DIAGNOSIS — Z79899 Other long term (current) drug therapy: Secondary | ICD-10-CM | POA: Insufficient documentation

## 2018-08-31 DIAGNOSIS — I493 Ventricular premature depolarization: Secondary | ICD-10-CM | POA: Diagnosis not present

## 2018-08-31 DIAGNOSIS — G4733 Obstructive sleep apnea (adult) (pediatric): Secondary | ICD-10-CM | POA: Diagnosis not present

## 2018-08-31 DIAGNOSIS — I701 Atherosclerosis of renal artery: Secondary | ICD-10-CM | POA: Insufficient documentation

## 2018-08-31 DIAGNOSIS — Z7901 Long term (current) use of anticoagulants: Secondary | ICD-10-CM | POA: Diagnosis not present

## 2018-08-31 DIAGNOSIS — Z87891 Personal history of nicotine dependence: Secondary | ICD-10-CM | POA: Insufficient documentation

## 2018-08-31 DIAGNOSIS — I5042 Chronic combined systolic (congestive) and diastolic (congestive) heart failure: Secondary | ICD-10-CM

## 2018-08-31 DIAGNOSIS — D649 Anemia, unspecified: Secondary | ICD-10-CM | POA: Diagnosis not present

## 2018-08-31 LAB — COMPREHENSIVE METABOLIC PANEL
ALT: 16 U/L (ref 0–44)
AST: 16 U/L (ref 15–41)
Albumin: 3.8 g/dL (ref 3.5–5.0)
Alkaline Phosphatase: 45 U/L (ref 38–126)
Anion gap: 15 (ref 5–15)
BUN: 133 mg/dL — ABNORMAL HIGH (ref 8–23)
CO2: 27 mmol/L (ref 22–32)
Calcium: 9.7 mg/dL (ref 8.9–10.3)
Chloride: 95 mmol/L — ABNORMAL LOW (ref 98–111)
Creatinine, Ser: 3.87 mg/dL — ABNORMAL HIGH (ref 0.44–1.00)
GFR calc Af Amer: 12 mL/min — ABNORMAL LOW (ref >60)
GFR calc non Af Amer: 11 mL/min — ABNORMAL LOW (ref >60)
Glucose, Bld: 95 mg/dL (ref 70–99)
Potassium: 3.8 mmol/L (ref 3.5–5.1)
Sodium: 137 mmol/L (ref 135–145)
Total Bilirubin: 0.9 mg/dL (ref 0.3–1.2)
Total Protein: 6.7 g/dL (ref 6.5–8.1)

## 2018-08-31 LAB — CBC
HCT: 32.6 % — ABNORMAL LOW (ref 36.0–46.0)
Hemoglobin: 10.4 g/dL — ABNORMAL LOW (ref 12.0–15.0)
MCH: 32.5 pg (ref 26.0–34.0)
MCHC: 31.9 g/dL (ref 30.0–36.0)
MCV: 101.9 fL — ABNORMAL HIGH (ref 80.0–100.0)
Platelets: 196 10*3/uL (ref 150–400)
RBC: 3.2 MIL/uL — ABNORMAL LOW (ref 3.87–5.11)
RDW: 13 % (ref 11.5–15.5)
WBC: 5 10*3/uL (ref 4.0–10.5)
nRBC: 0 % (ref 0.0–0.2)

## 2018-08-31 LAB — TSH: TSH: 9.176 u[IU]/mL — ABNORMAL HIGH (ref 0.350–4.500)

## 2018-08-31 MED ORDER — TORSEMIDE 20 MG PO TABS: ORAL_TABLET | ORAL | 2 refills | Status: DC

## 2018-08-31 NOTE — Patient Instructions (Signed)
Labs done today. We will call you only if labs are abnormal.   INCREASE Torsemide 60mg  daily alternating with 40mg  every other day.  Your physician recommends that you schedule a follow-up appointment in: 2 months with a lab only appointment in 2 weeks   At the [facility], you and your health needs are our priority. As part of our continuing mission to provide you with exceptional heart care, we have created designated Provider Care Teams. These Care Teams include your primary Cardiologist (physician) and Advanced Practice Providers (APPs- Physician Assistants and Nurse Practitioners) who all work together to provide you with the care you need, when you need it.   You may see any of the following providers on your designated Care Team at your next follow up: [name] Dr Glori Bickers . Dr [name] . Darrick Grinder, NP

## 2018-08-31 NOTE — Progress Notes (Signed)
  Echocardiogram 2D Echocardiogram has been performed.  Gina [name] 08/31/2018, 1:54 PM

## 2018-09-01 NOTE — Progress Notes (Signed)
PCP: Dr Woody Seller  HF Cardiology: Dr Aundra Dubin  Neprology: Dr Hinda Lenis EP: Dr Caryl Comes   HPI: Gina [name] is a 78 y.o.female with h/o chronic systolic CHF, anemia, HTN, LBBB, renal artery stenosis, and OSA.   Admitted 09/02/2017 with peripheral edema and worsening SOB. Attempts at diuresis were difficult due to AKI. Transferred to [facility] 09/10/17 in setting of cardiogenic shock 2/2 Afib RVR. She required milrinone.  She underwent DCCV 7/11 and 7/14. Discussed with EP after she went back into Afib a third time. Underwent DCCV 7/18 and stayed in for NSR most of the day, but then went back into Afib. Underwent AV nodal ablation and St Jude PPM implantation 09/19/17 with Dr. Caryl Comes. Unable to place an LV lead. She was discharged to SNF. Last echo in 7/19 with EF 20%.    Zio patch in 4/20 showed atrial fibrillation with average rate 73, rare PVCs and PACs.   Today she returns for HF follow up.   Echo was done today and reviewed, EF remains 20% with diffuse hypokinesis and mild dilation, moderate MR, mildly decreased RV systolic function. She uses a walker.  She generally does ok walking around the house but gets short of breath after walking 30-40 feet, this is stable. She sleeps on 1 pillow.  No chest pain, no lightheadedness.  She has a painful left shoulder from arthritis, planned for steroid injection.   Labs (1/20): K 3.3, creatinine 2.45 Labs (3/20): K 3.2, creatinine 2.64, LFTs normal  PMH: 1. OSA: Uses CPAP.  2. Atrial fibrillation: permanent.  Now s/p AV nodal ablation with St Jude PPM.  3. Chronic systolic CHF: Since 1324, nonischemic cardiomyopathy.  - LHC (2017): Normal coronaries.  - Echo (7/19): EF 20% with mild LV dilation and diffuse hypokinesis, mild RV dilation with mild to moderate systolic dysfunction.  - Low output HF in 7/19 requiring milrinone.  - Unable to place LV lead at time of AV nodal ablation and pacing.  - Echo (6/20): EF 20%, mild diffuse hypokinesis, mild dilation, mildly  decreased RV systolic function with moderate MR, PASP 47.  4. CKD stage 3-4.  5. H/o LBBB 6. Renal artery stenosis: 80% on left.  7. Frequent PVCs - Zio patch in 4/20: Atrial fibrillation with average rate 73, rare PVCs and PACs.   Review of systems complete and found to be negative unless listed in HPI.   Social History   Socioeconomic History  . Marital status: Married    Spouse name: Not on file  . Number of children: 0  . Years of education: Not on file  . Highest education level: Not on file  Occupational History  . Not on file  Social Needs  . Financial resource strain: Not on file  . Food insecurity    Worry: Not on file    Inability: Not on file  . Transportation needs    Medical: Not on file    Non-medical: Not on file  Tobacco Use  . Smoking status: Former Smoker    Packs/day: 1.00    Years: 24.00    Pack years: 24.00    Types: Cigarettes    Start date: 10/29/1958    Quit date: 12/02/1985    Years since quitting: 32.7  . Smokeless tobacco: Never Used  Substance and Sexual Activity  . Alcohol use: No    Alcohol/week: 0.0 standard drinks  . Drug use: Not on file  . Sexual activity: Not on file  Lifestyle  . Physical activity  Days per week: Not on file    Minutes per session: Not on file  . Stress: Not on file  Relationships  . Social Herbalist on phone: Not on file    Gets together: Not on file    Attends religious service: Not on file    Active member of club or organization: Not on file    Attends meetings of clubs or organizations: Not on file    Relationship status: Not on file  . Intimate partner violence    Fear of current or ex partner: Not on file    Emotionally abused: Not on file    Physically abused: Not on file    Forced sexual activity: Not on file  Other Topics Concern  . Not on file  Social History Narrative  . Not on file    Family History  Problem Relation Age of Onset  . Rheumatic fever Mother        in her  early 62's  . Alzheimer's disease Mother   . Stomach cancer Father 3       died 73  . Bradycardia Brother        has pacemaker  . Heart failure Paternal Grandmother   . Heart failure Paternal Grandfather     Current Outpatient Medications  Medication Sig Dispense Refill  . acetaminophen (TYLENOL) 325 MG tablet Take 2 tablets (650 mg total) by mouth every 4 (four) hours as needed for headache or mild pain.    [name] amiodarone (PACERONE) 200 MG tablet Take 1 tablet (200 mg total) by mouth daily. 60 tablet 3  . calcitRIOL (ROCALTROL) 0.25 MCG capsule Take 0.25 mcg by mouth daily.    . carvedilol (COREG) 12.5 MG tablet Take 1 tablet (12.5 mg total) by mouth 2 (two) times daily. 90 tablet 3  . ELIQUIS 5 MG TABS tablet TAKE 1 TABLET(5 MG) BY MOUTH TWICE DAILY 60 tablet 3  . metolazone (ZAROXOLYN) 2.5 MG tablet Take 1 tablet (2.5 mg total) by mouth once a week. Wednesday mornings. 5 tablet 11  . Multiple Vitamin (MULTIVITAMIN) tablet Take 1 tablet by mouth daily.    . potassium chloride SA (K-DUR,KLOR-CON) 20 MEQ tablet Take 1 tablet (20 mEq total) by mouth daily. 90 tablet 3  . spironolactone (ALDACTONE) 25 MG tablet Take 1 tablet (25 mg total) by mouth daily. 90 tablet 3  . torsemide (DEMADEX) 20 MG tablet Take 60mg  daily alternating with 40mg  every other day. 150 tablet 2  . potassium chloride 20 MEQ TBCR Take 20 mEq by mouth daily for 3 days. 10 tablet 0   No current facility-administered medications for this encounter.     Vitals:   08/31/18 1415  BP: 130/82  Pulse: 67  SpO2: 92%  Weight: 89.8 kg (198 lb)   Wt Readings from Last 3 Encounters:  08/31/18 89.8 kg (198 lb)  05/13/18 88.5 kg (195 lb 3.2 oz)  03/20/18 90.4 kg (199 lb 6.4 oz)   PHYSICAL EXAM: General: NAD Neck: JVP 8-9 cm with HJR, no thyromegaly or thyroid nodule.  Lungs: Clear to auscultation bilaterally with normal respiratory effort. CV: Nondisplaced PMI.  Heart regular S1/S2, no S3/S4, no murmur.  No peripheral  edema.  No carotid bruit.  Normal pedal pulses.  Abdomen: Soft, nontender, no hepatosplenomegaly, no distention.  Skin: Intact without lesions or rashes.  Neurologic: Alert and oriented x 3.  Psych: Normal affect. Extremities: No clubbing or cyanosis.  HEENT: Normal.   ASSESSMENT &  PLAN: 1. Chronic Systolic CHF: Echo (0/10) with EF 20%. Long-standing cardiomyopathy, nonischemic based on cath in 2017. 7/19 hospitalization withlow output HF likely triggered by atrial fibrillation/RVR.  She was started on milrinone.  DCCVs failed and she ultimately had AV nodal ablation with St Jude PPM placed (unable to place LV lead).  Echo was done today and reviewed, EF remains 20%.  She is mildly volume overloaded on exam.  Chronic NYHA class III symptoms.   - Increase torsemide to 60 daily alternating with 40 daily.  BMET today and again in 10 days.    - Continue metolazone 2.5 q Wed.   - Continue Coreg 12.5 mg bid.    - Continue spironolactone 25 mg daily.  - No ACEI/ARNI/ARB with elevated creatinine.  - She has a high percentage of RV pacing. Plan per Dr. Caryl Comes was for her to followup with Dr  Lovena Le for possible re-attempt LV lead placement versus His lead placement.  She has appt on 10/07/18.  2.CKD stage 3: Check renal function today.  3. Atrial fibrillation: Chronic.   She is now s/p AV nodal ablation with St Jude PPM.  - Continue Eliquis 5 mg twice a day.  5. OSA - Continue CPAP qHS. 5. PVCs: She is on amiodarone for frequent PVCs (?contributing to cardiomyopathy).  Zio patch in 4/20 on amiodarone showed only rare PVCs.  - Continue amiodarone 200 mg daily.  Check LFTs and TSH today.  She was told that she needs a regular eye exam.   Followup 2 months.   [name]   09/01/2018

## 2018-09-03 ENCOUNTER — Encounter (HOSPITAL_COMMUNITY): Payer: Self-pay

## 2018-09-03 ENCOUNTER — Telehealth (HOSPITAL_COMMUNITY): Payer: Self-pay

## 2018-09-03 MED ORDER — TORSEMIDE 20 MG PO TABS: 40.0000 mg | ORAL_TABLET | Freq: Every day | ORAL | 2 refills | Status: DC

## 2018-09-03 NOTE — Telephone Encounter (Signed)
-----   Message from Larey Dresser, MD sent at 08/31/2018 11:30 PM EDT ----- Stop spironolactone.  Would stop weekly metolazone.  Hold torsemide for a day and do not increase as was planned today (keep at 40 mg daily).  BMET in 1 week.  Needs followup with nephrology with worsening renal function

## 2018-09-03 NOTE — Telephone Encounter (Signed)
LM for patient to return call to office. As office is closed tomorrow, instructions also sent via mychart message.   Instructions below:  STOP Spironolactone  STOP Weekly Metolazone  HOLD Torsemide for 1 day  After holding Torsemide for 1 day, resume at previous dose of 40mg  daily.    REPEAT Blood work in 1 week.  Please call us to schedule an appointment.   Follow up with Nephrology with elevated kidney function.

## 2018-09-08 NOTE — Telephone Encounter (Signed)
-----   Message from Larey Dresser, MD sent at 08/31/2018 11:30 PM EDT ----- Stop spironolactone.  Would stop weekly metolazone.  Hold torsemide for a day and do not increase as was planned today (keep at 40 mg daily).  BMET in 1 week.  Needs followup with nephrology with worsening renal function

## 2018-09-08 NOTE — Telephone Encounter (Signed)
LM for patient to return call to office as we have been trying to reach her since last week re: lab work

## 2018-09-08 NOTE — Telephone Encounter (Signed)
Returned call as patient left VM at office. She reports that she received the messages on vm and mychart last week. She verbalized understanding of all.  She is advised to get lab work done tomorrow and office will contact her with results if abnormal. Verbalized understanding.

## 2018-09-11 ENCOUNTER — Other Ambulatory Visit: Payer: Self-pay | Admitting: Internal Medicine

## 2018-09-14 DIAGNOSIS — I052 Rheumatic mitral stenosis with insufficiency: Secondary | ICD-10-CM | POA: Diagnosis not present

## 2018-09-18 ENCOUNTER — Ambulatory Visit (INDEPENDENT_AMBULATORY_CARE_PROVIDER_SITE_OTHER): Payer: Medicare Other | Admitting: *Deleted

## 2018-09-18 DIAGNOSIS — I480 Paroxysmal atrial fibrillation: Secondary | ICD-10-CM

## 2018-09-18 DIAGNOSIS — R001 Bradycardia, unspecified: Secondary | ICD-10-CM

## 2018-09-18 LAB — CUP PACEART REMOTE DEVICE CHECK
Date Time Interrogation Session: 20200717075520
Implantable Lead Implant Date: 20190719
Implantable Lead Implant Date: 20190719
Implantable Lead Location: 753859
Implantable Lead Location: 753860
Implantable Lead Model: 5076
Implantable Lead Model: 5076
Implantable Pulse Generator Implant Date: 20190719
Pulse Gen Model: 3222
Pulse Gen Serial Number: 9022392

## 2018-09-23 ENCOUNTER — Encounter: Payer: Self-pay | Admitting: Cardiology

## 2018-09-23 NOTE — Progress Notes (Signed)
Remote pacemaker transmission.   

## 2018-10-07 ENCOUNTER — Other Ambulatory Visit: Payer: Self-pay

## 2018-10-07 ENCOUNTER — Encounter: Payer: Self-pay | Admitting: Internal Medicine

## 2018-10-07 ENCOUNTER — Ambulatory Visit (INDEPENDENT_AMBULATORY_CARE_PROVIDER_SITE_OTHER): Payer: Medicare Other | Admitting: Internal Medicine

## 2018-10-07 VITALS — BP 110/74 | HR 70 | Ht 64.0 in | Wt 189.0 lb

## 2018-10-07 DIAGNOSIS — I447 Left bundle-branch block, unspecified: Secondary | ICD-10-CM

## 2018-10-07 DIAGNOSIS — Z950 Presence of cardiac pacemaker: Secondary | ICD-10-CM

## 2018-10-07 DIAGNOSIS — I428 Other cardiomyopathies: Secondary | ICD-10-CM | POA: Diagnosis not present

## 2018-10-07 NOTE — Patient Instructions (Addendum)
Medication Instructions:  Your physician recommends that you continue on your current medications as directed. Please refer to the Current Medication list given to you today.  Labwork: None ordered.  Testing/Procedures: None ordered.  Follow-Up:  Dates available for procedures with Dr. Lovena Le:  August 17, 20, 26, 28 September 2, 4, 9, 10, 14, 17, 21, 23, 30  Any Other Special Instructions Will Be Listed Below (If Applicable).  If you need a refill on your cardiac medications before your next appointment, please call your pharmacy.

## 2018-10-07 NOTE — Progress Notes (Signed)
HPI Gina [name] is referred today by Dr. Aundra Dubin for consideration for LV lead/His bundle lead placement. She has a h/o uncontrolled atrial fib and recent development of severe LV dysfunction, EF 25%. She has not had syncope. Since her AV node ablation, she is improved. She has class 2-3 CHF symptoms. She does have worsening renal failure, and is now stage 4, approaching 5.  Allergies  Allergen Reactions  . Cephalexin Rash    REACTION: Rash to arms/legs     Current Outpatient Medications  Medication Sig Dispense Refill  . acetaminophen (TYLENOL) 325 MG tablet Take 2 tablets (650 mg total) by mouth every 4 (four) hours as needed for headache or mild pain.    [name] amiodarone (PACERONE) 200 MG tablet Take 1 tablet (200 mg total) by mouth daily. 60 tablet 3  . calcitRIOL (ROCALTROL) 0.25 MCG capsule Take 0.25 mcg by mouth daily.    . carvedilol (COREG) 12.5 MG tablet Take 1 tablet (12.5 mg total) by mouth 2 (two) times daily. 90 tablet 3  . ELIQUIS 5 MG TABS tablet TAKE 1 TABLET(5 MG) BY MOUTH TWICE DAILY 60 tablet 3  . metolazone (ZAROXOLYN) 2.5 MG tablet Take 1 tablet by mouth once a week.    . Multiple Vitamin (MULTIVITAMIN) tablet Take 1 tablet by mouth daily.    . potassium chloride SA (K-DUR,KLOR-CON) 20 MEQ tablet Take 1 tablet (20 mEq total) by mouth daily. 90 tablet 3  . spironolactone (ALDACTONE) 25 MG tablet Take 1 tablet by mouth daily.    [name] torsemide (DEMADEX) 20 MG tablet Take 2 tablets (40 mg total) by mouth daily. 180 tablet 2   No current facility-administered medications for this visit.      Past Medical History:  Diagnosis Date  . Acute renal failure [facility])     hospitalized... December 08, 2009... improved with hydration in the hospital  . Anemia    further workup needed... October, 2011.  [name] Aortic insufficiency    mild...echo... June 2 010  . Arthritis    Hips/Knees, severe, requiring a walker  . Asymmetric septal hypertrophy (HCC)    echo....04/2009....patient  encouraged the family to be screened elsewhere  . Atrial fibrillation ([place])     ??? atrial fibrillation during hospitalization October, 2011 ???..  . Bradycardia   . CAD (coronary artery disease)    Mild coronary disease, catheterization, 2009  . Cardiomyopathy, nonischemic ([place])    EF improved October, 2011  . Ejection fraction    EF 25%...nondiagnostic MRI December 2009...  /  echo June, 2010  /   echo... December 08, 2009.... ejection fraction improved... EF 60% /  EF 30%... echo.... [place]... December 28, 2009 with CHFPLanned ICD / CRT... patient has seen Dr.Klein..EF improved October, 2011   . Groin hematoma    Right-hematoma/abscess..repaired.. December, 2009  . Hypertension   . LBBB (left bundle branch block)   . LVH (left ventricular hypertrophy)    moderately severe... echo.. June, 2010  /  EF improved October, 2011.... cancel plans for ICD  . Mitral regurgitation    mild/ moderate...echo June, 2010  /   echo.. October, 2011.... mitral regurgitation  improved  . OSA (obstructive sleep apnea)   . Renal artery stenosis (HCC)    80% upper left   . Systolic heart failure    chronic    ROS:   All systems reviewed and negative except as noted in the HPI.   Past Surgical History:  Procedure Laterality Date  .  ABDOMINAL HYSTERECTOMY    . AV NODE ABLATION N/A 09/19/2017   Procedure: AV NODE ABLATION;  Surgeon: Deboraha Sprang, MD;  Location: [place] [facility];  Service: Cardiovascular;  Laterality: N/A;  . BIV PACEMAKER INSERTION CRT-P N/A 09/19/2017   Procedure: BIV PACEMAKER INSERTION CRT-P;  Surgeon: Deboraha Sprang, MD;  Location: [place] [facility];  Service: Cardiovascular;  Laterality: N/A;  . CARDIAC CATHETERIZATION N/A 08/14/2015   Procedure: Right/Left Heart Cath and Coronary Angiography;  Surgeon: Leonie Man, MD;  Location: [place] [facility];  Service: Cardiovascular;  Laterality: N/A;  . CARDIOVERSION N/A 09/11/2017   Procedure: CARDIOVERSION;  Surgeon:  Larey Dresser, MD;  Location: [facility] ENDOSCOPY;  Service: Cardiovascular;  Laterality: N/A;  . CARDIOVERSION N/A 09/14/2017   Procedure: CARDIOVERSION;  Surgeon: Larey Dresser, MD;  Location: [place];  Service: Cardiovascular;  Laterality: N/A;  . CARDIOVERSION N/A 09/18/2017   Procedure: CARDIOVERSION FOR ATRIAL FIBRILLATION;  Surgeon: Larey Dresser, MD;  Location: [facility] ENDOSCOPY;  Service: Cardiovascular;  Laterality: N/A;  . REPLACEMENT TOTAL HIP W/  RESURFACING IMPLANTS    . REPLACEMENT TOTAL KNEE    . Surgical Repair of a Catheteriztion associated femoral artery injury       Family History  Problem Relation Age of Onset  . Rheumatic fever Mother        in her early 31's  . Alzheimer's disease Mother   . Stomach cancer Father 40       died 18  . Bradycardia Brother        has pacemaker  . Heart failure Paternal Grandmother   . Heart failure Paternal Grandfather      Social History   Socioeconomic History  . Marital status: Married    Spouse name: Not on file  . Number of children: 0  . Years of education: Not on file  . Highest education level: Not on file  Occupational History  . Not on file  Social Needs  . Financial resource strain: Not on file  . Food insecurity    Worry: Not on file    Inability: Not on file  . Transportation needs    Medical: Not on file    Non-medical: Not on file  Tobacco Use  . Smoking status: Former Smoker    Packs/day: 1.00    Years: 24.00    Pack years: 24.00    Types: Cigarettes    Start date: 10/29/1958    Quit date: 12/02/1985    Years since quitting: 32.8  . Smokeless tobacco: Never Used  Substance and Sexual Activity  . Alcohol use: No    Alcohol/week: 0.0 standard drinks  . Drug use: Not on file  . Sexual activity: Not on file  Lifestyle  . Physical activity    Days per week: Not on file    Minutes per session: Not on file  . Stress: Not on file  Relationships  . Social Herbalist on phone: Not on file     Gets together: Not on file    Attends religious service: Not on file    Active member of club or organization: Not on file    Attends meetings of clubs or organizations: Not on file    Relationship status: Not on file  . Intimate partner violence    Fear of current or ex partner: Not on file    Emotionally abused: Not on file    Physically abused: Not on file  Forced sexual activity: Not on file  Other Topics Concern  . Not on file  Social History Narrative  . Not on file     BP 110/74   Pulse 70   Ht 5\' 4"  (1.626 m)   Wt 189 lb (85.7 kg)   SpO2 97%   BMI 32.44 kg/m   Physical Exam:  Well appearing NAD HEENT: Unremarkable Neck:  No JVD, no thyromegally Lymphatics:  No adenopathy Back:  No CVA tenderness Lungs:  Clear with no wheezes HEART:  Regular rate rhythm, no murmurs, no rubs, no clicks Abd:  soft, positive bowel sounds, no organomegally, no rebound, no guarding Ext:  2 plus pulses, no edema, no cyanosis, no clubbing Skin:  No rashes no nodules Neuro:  CN II through XII intact, motor grossly intact  EKG - reviewed. Atrial fib with ventricular pacing, QRS 238 ms!  DEVICE  Not evaluated today  Assess/Plan: 1. Chronic systolic heart failure - she has pacing induced LBBB with a markedly prolonged QRS. I have discussed the treatment options with the patient and offered her attempted biv upgrade. She will call us if she would like to undergo attempted upgrade.  2. Obesity - she needs to lose weight.   Mikle Bosworth.D.

## 2018-10-16 LAB — CUP PACEART INCLINIC DEVICE CHECK
Date Time Interrogation Session: 20200814163852
Implantable Lead Implant Date: 20190719
Implantable Lead Implant Date: 20190719
Implantable Lead Location: 753859
Implantable Lead Location: 753860
Implantable Lead Model: 5076
Implantable Lead Model: 5076
Implantable Pulse Generator Implant Date: 20190719
Pulse Gen Model: 3222
Pulse Gen Serial Number: 9022392

## 2018-10-19 DIAGNOSIS — I447 Left bundle-branch block, unspecified: Secondary | ICD-10-CM

## 2018-10-19 DIAGNOSIS — I428 Other cardiomyopathies: Secondary | ICD-10-CM

## 2018-11-06 ENCOUNTER — Ambulatory Visit (HOSPITAL_COMMUNITY)
Admission: RE | Admit: 2018-11-06 | Discharge: 2018-11-06 | Disposition: A | Discharge status: Home / Self Care | Payer: Medicare Other | Source: Ambulatory Visit | Attending: Cardiology | Admitting: Cardiology

## 2018-11-06 ENCOUNTER — Other Ambulatory Visit: Payer: Self-pay

## 2018-11-06 ENCOUNTER — Encounter (HOSPITAL_COMMUNITY): Payer: Self-pay | Admitting: Cardiology

## 2018-11-06 VITALS — BP 110/64 | HR 68 | Wt 200.4 lb

## 2018-11-06 DIAGNOSIS — Z7901 Long term (current) use of anticoagulants: Secondary | ICD-10-CM | POA: Diagnosis not present

## 2018-11-06 DIAGNOSIS — Z8249 Family history of ischemic heart disease and other diseases of the circulatory system: Secondary | ICD-10-CM | POA: Insufficient documentation

## 2018-11-06 DIAGNOSIS — I493 Ventricular premature depolarization: Secondary | ICD-10-CM | POA: Insufficient documentation

## 2018-11-06 DIAGNOSIS — I5022 Chronic systolic (congestive) heart failure: Secondary | ICD-10-CM | POA: Insufficient documentation

## 2018-11-06 DIAGNOSIS — Z800 Family history of malignant neoplasm of digestive organs: Secondary | ICD-10-CM | POA: Diagnosis not present

## 2018-11-06 DIAGNOSIS — I4819 Other persistent atrial fibrillation: Secondary | ICD-10-CM | POA: Diagnosis not present

## 2018-11-06 DIAGNOSIS — I5042 Chronic combined systolic (congestive) and diastolic (congestive) heart failure: Secondary | ICD-10-CM | POA: Diagnosis not present

## 2018-11-06 DIAGNOSIS — I701 Atherosclerosis of renal artery: Secondary | ICD-10-CM | POA: Diagnosis not present

## 2018-11-06 DIAGNOSIS — G4733 Obstructive sleep apnea (adult) (pediatric): Secondary | ICD-10-CM | POA: Diagnosis not present

## 2018-11-06 DIAGNOSIS — Z87891 Personal history of nicotine dependence: Secondary | ICD-10-CM | POA: Insufficient documentation

## 2018-11-06 DIAGNOSIS — N184 Chronic kidney disease, stage 4 (severe): Secondary | ICD-10-CM | POA: Diagnosis not present

## 2018-11-06 DIAGNOSIS — I130 Hypertensive heart and chronic kidney disease with heart failure and stage 1 through stage 4 chronic kidney disease, or unspecified chronic kidney disease: Secondary | ICD-10-CM | POA: Insufficient documentation

## 2018-11-06 DIAGNOSIS — Z820 Family history of epilepsy and other diseases of the nervous system: Secondary | ICD-10-CM | POA: Insufficient documentation

## 2018-11-06 DIAGNOSIS — I428 Other cardiomyopathies: Secondary | ICD-10-CM | POA: Diagnosis not present

## 2018-11-06 DIAGNOSIS — I482 Chronic atrial fibrillation, unspecified: Secondary | ICD-10-CM | POA: Diagnosis not present

## 2018-11-06 DIAGNOSIS — Z79899 Other long term (current) drug therapy: Secondary | ICD-10-CM | POA: Diagnosis not present

## 2018-11-06 LAB — COMPREHENSIVE METABOLIC PANEL
ALT: 18 U/L (ref 0–44)
AST: 16 U/L (ref 15–41)
Albumin: 4 g/dL (ref 3.5–5.0)
Alkaline Phosphatase: 46 U/L (ref 38–126)
Anion gap: 15 (ref 5–15)
BUN: 118 mg/dL — ABNORMAL HIGH (ref 8–23)
CO2: 25 mmol/L (ref 22–32)
Calcium: 9.7 mg/dL (ref 8.9–10.3)
Chloride: 96 mmol/L — ABNORMAL LOW (ref 98–111)
Creatinine, Ser: 4.38 mg/dL — ABNORMAL HIGH (ref 0.44–1.00)
GFR calc Af Amer: 10 mL/min — ABNORMAL LOW (ref >60)
GFR calc non Af Amer: 9 mL/min — ABNORMAL LOW (ref >60)
Glucose, Bld: 106 mg/dL — ABNORMAL HIGH (ref 70–99)
Potassium: 4.9 mmol/L (ref 3.5–5.1)
Sodium: 136 mmol/L (ref 135–145)
Total Bilirubin: 1.1 mg/dL (ref 0.3–1.2)
Total Protein: 6.8 g/dL (ref 6.5–8.1)

## 2018-11-06 LAB — TSH: TSH: 11.123 u[IU]/mL — ABNORMAL HIGH (ref 0.350–4.500)

## 2018-11-06 NOTE — Patient Instructions (Signed)
STOP Spirolactone  Labs today We will only contact you if something comes back abnormal or we need to make some changes. Otherwise no news is good news!  Your physician recommends that you schedule a follow-up appointment in: 2 months with Dr Aundra Dubin  At the [facility], you and your health needs are our priority. As part of our continuing mission to provide you with exceptional heart care, we have created designated Provider Care Teams. These Care Teams include your primary Cardiologist (physician) and Advanced Practice Providers (APPs- Physician Assistants and Nurse Practitioners) who all work together to provide you with the care you need, when you need it.   You may see any of the following providers on your designated Care Team at your next follow up: [name] Dr Glori Bickers . Dr [name] . Darrick Grinder, NP   Please be sure to bring in all your medications bottles to every appointment.

## 2018-11-07 ENCOUNTER — Other Ambulatory Visit (HOSPITAL_COMMUNITY): Payer: Self-pay | Admitting: Cardiology

## 2018-11-08 NOTE — Progress Notes (Signed)
PCP: Dr Woody Seller  HF Cardiology: Dr Aundra Dubin  Neprology: Dr Hinda Lenis EP: Dr Caryl Comes   HPI: Gina [name] is a 78 y.o.female with h/o chronic systolic CHF, anemia, HTN, LBBB, renal artery stenosis, and OSA.   Admitted 09/02/2017 with peripheral edema and worsening SOB. Attempts at diuresis were difficult due to AKI. Transferred to [facility] 09/10/17 in setting of cardiogenic shock 2/2 Afib RVR. She required milrinone.  She underwent DCCV 7/11 and 7/14. Discussed with EP after she went back into Afib a third time. Underwent DCCV 7/18 and stayed in for NSR most of the day, but then went back into Afib. Underwent AV nodal ablation and St Jude PPM implantation 09/19/17 with Dr. Caryl Comes. Unable to place an LV lead. She was discharged to SNF. Last echo in 7/19 with EF 20%.    Zio patch in 4/20 showed atrial fibrillation with average rate 73, rare PVCs and PACs.   Echo in 6/20 showed EF 20% with diffuse hypokinesis and mild dilation, moderate MR, mildly decreased RV systolic function.   She returns for followup of CHF.  Creatinine has been slowly rising. I asked her to stop spironolactone after last appointment but she is still taking it.  She uses a walker.  She generally does ok walking around the house but gets short of breath after walking 30-40 feet, this is stable. No orthopnea/PND.  No chest pain.  Fatigues easily.  No lightheadedness or falls.  Labs (1/20): K 3.3, creatinine 2.45 Labs (3/20): K 3.2, creatinine 2.64, LFTs normal Labs (6/20): LFTs, normal, TSH elevated, creatinine 3.87  PMH: 1. OSA: Uses CPAP.  2. Atrial fibrillation: permanent.  Now s/p AV nodal ablation with St Jude PPM.  3. Chronic systolic CHF: Since 123XX123, nonischemic cardiomyopathy.  - LHC (2017): Normal coronaries.  - Echo (7/19): EF 20% with mild LV dilation and diffuse hypokinesis, mild RV dilation with mild to moderate systolic dysfunction.  - Low output HF in 7/19 requiring milrinone.  - Unable to place LV lead at time of AV nodal  ablation and pacing.  - Echo (6/20): EF 20%, mild diffuse hypokinesis, mild dilation, mildly decreased RV systolic function with moderate MR, PASP 47.  4. CKD stage 4.  5. H/o LBBB 6. Renal artery stenosis: 80% on left.  7. Frequent PVCs - Zio patch in 4/20: Atrial fibrillation with average rate 73, rare PVCs and PACs.   Review of systems complete and found to be negative unless listed in HPI.   Social History   Socioeconomic History  . Marital status: Married    Spouse name: Not on file  . Number of children: 0  . Years of education: Not on file  . Highest education level: Not on file  Occupational History  . Not on file  Social Needs  . Financial resource strain: Not on file  . Food insecurity    Worry: Not on file    Inability: Not on file  . Transportation needs    Medical: Not on file    Non-medical: Not on file  Tobacco Use  . Smoking status: Former Smoker    Packs/day: 1.00    Years: 24.00    Pack years: 24.00    Types: Cigarettes    Start date: 10/29/1958    Quit date: 12/02/1985    Years since quitting: 32.9  . Smokeless tobacco: Never Used  Substance and Sexual Activity  . Alcohol use: No    Alcohol/week: 0.0 standard drinks  . Drug use: Not on  file  . Sexual activity: Not on file  Lifestyle  . Physical activity    Days per week: Not on file    Minutes per session: Not on file  . Stress: Not on file  Relationships  . Social Herbalist on phone: Not on file    Gets together: Not on file    Attends religious service: Not on file    Active member of club or organization: Not on file    Attends meetings of clubs or organizations: Not on file    Relationship status: Not on file  . Intimate partner violence    Fear of current or ex partner: Not on file    Emotionally abused: Not on file    Physically abused: Not on file    Forced sexual activity: Not on file  Other Topics Concern  . Not on file  Social History Narrative  . Not on file     Family History  Problem Relation Age of Onset  . Rheumatic fever Mother        in her early 11's  . Alzheimer's disease Mother   . Stomach cancer Father 59       died 56  . Bradycardia Brother        has pacemaker  . Heart failure Paternal Grandmother   . Heart failure Paternal Grandfather     Current Outpatient Medications  Medication Sig Dispense Refill  . acetaminophen (TYLENOL) 325 MG tablet Take 2 tablets (650 mg total) by mouth every 4 (four) hours as needed for headache or mild pain.    [name] amiodarone (PACERONE) 200 MG tablet Take 1 tablet (200 mg total) by mouth daily. 60 tablet 3  . calcitRIOL (ROCALTROL) 0.25 MCG capsule Take 0.25 mcg by mouth daily.    . carvedilol (COREG) 12.5 MG tablet Take 1 tablet (12.5 mg total) by mouth 2 (two) times daily. 90 tablet 3  . ELIQUIS 5 MG TABS tablet TAKE 1 TABLET(5 MG) BY MOUTH TWICE DAILY 60 tablet 3  . metolazone (ZAROXOLYN) 2.5 MG tablet Take 1 tablet by mouth once a week.    . Multiple Vitamin (MULTIVITAMIN) tablet Take 1 tablet by mouth daily.    . potassium chloride SA (K-DUR,KLOR-CON) 20 MEQ tablet Take 1 tablet (20 mEq total) by mouth daily. 90 tablet 3  . torsemide (DEMADEX) 20 MG tablet Take 2 tablets (40 mg total) by mouth daily. 180 tablet 2   No current facility-administered medications for this encounter.     Vitals:   11/06/18 1449  BP: 110/64  Pulse: 68  SpO2: 91%  Weight: 90.9 kg (200 lb 6.4 oz)   Wt Readings from Last 3 Encounters:  11/06/18 90.9 kg (200 lb 6.4 oz)  10/07/18 85.7 kg (189 lb)  08/31/18 89.8 kg (198 lb)   PHYSICAL EXAM: General: NAD Neck: No JVD, no thyromegaly or thyroid nodule.  Lungs: Clear to auscultation bilaterally with normal respiratory effort. CV: Nondisplaced PMI.  Heart regular S1/S2, no S3/S4, no murmur.  No peripheral edema.  No carotid bruit.  Normal pedal pulses.  Abdomen: Soft, nontender, no hepatosplenomegaly, no distention.  Skin: Intact without lesions or rashes.   Neurologic: Alert and oriented x 3.  Psych: Normal affect. Extremities: No clubbing or cyanosis.  HEENT: Normal.   ASSESSMENT & PLAN: 1. Chronic Systolic CHF: Echo (A999333) with EF 20%. Long-standing cardiomyopathy, nonischemic based on cath in 2017. 7/19 hospitalization withlow output HF likely triggered by atrial fibrillation/RVR.  She  was started on milrinone.  DCCVs failed and she ultimately had AV nodal ablation with St Jude PPM placed (unable to place LV lead).  Echo in 6/20 showed EF 20%.  She is not volume overloaded on exam.  NYHA class III symptoms.   - Increase torsemide 40 daily.  BMET today.    - Continue metolazone 2.5 q Wed.   - Continue Coreg 12.5 mg bid.    - Stop spironolactone given CKD 4.  - No ACEI/ARNI/ARB with elevated creatinine.  - She has a high percentage of RV pacing. Plan for re-attempted LV lead placement in 9/20.  2.CKD stage 4: She sees nephrology (Dr. Lowanda Foster). - Check renal function today.  3. Atrial fibrillation: Chronic.   She is now s/p AV nodal ablation with St Jude PPM.  - Continue Eliquis 5 mg twice a day.  5. OSA - Continue CPAP qHS. 5. PVCs: She is on amiodarone for frequent PVCs (?contributing to cardiomyopathy).  Zio patch in 4/20 on amiodarone showed only rare PVCs.  - Continue amiodarone 200 mg daily.  Check LFTs and TSH today.  She was told that she needs a regular eye exam.   Followup 2 months.   [name]   11/08/2018

## 2018-11-10 DIAGNOSIS — E039 Hypothyroidism, unspecified: Secondary | ICD-10-CM | POA: Diagnosis not present

## 2018-11-10 DIAGNOSIS — R21 Rash and other nonspecific skin eruption: Secondary | ICD-10-CM | POA: Diagnosis not present

## 2018-11-10 DIAGNOSIS — B029 Zoster without complications: Secondary | ICD-10-CM | POA: Diagnosis not present

## 2018-11-11 ENCOUNTER — Telehealth (HOSPITAL_COMMUNITY): Payer: Self-pay

## 2018-11-11 DIAGNOSIS — I5042 Chronic combined systolic (congestive) and diastolic (congestive) heart failure: Secondary | ICD-10-CM

## 2018-11-11 NOTE — Telephone Encounter (Signed)
Pt aware of results.  Pt to have labs done at Dr Forde Dandy office on 9/18. Note added to add tsh, free t3 and t4. Pt verbalized understanding. Spoke with Katrina in Dr Forde Dandy office for approval.  They will draw labs with her preprocedure labs.

## 2018-11-11 NOTE — Telephone Encounter (Signed)
-----   Message from Larey Dresser, MD sent at 11/06/2018  4:16 PM EDT ----- Steady rise in creatinine.  Make sure she has stopped her spironolactone.  She needs appointment to see her nephrologist in [place].  She needs to get blood draw for free T4, free T3, and repeat TSH.

## 2018-11-13 ENCOUNTER — Other Ambulatory Visit: Payer: Medicare Other

## 2018-11-17 ENCOUNTER — Encounter (HOSPITAL_COMMUNITY): Payer: Self-pay

## 2018-11-17 DIAGNOSIS — I10 Essential (primary) hypertension: Secondary | ICD-10-CM | POA: Diagnosis not present

## 2018-11-17 DIAGNOSIS — I509 Heart failure, unspecified: Secondary | ICD-10-CM | POA: Diagnosis not present

## 2018-11-17 DIAGNOSIS — Z299 Encounter for prophylactic measures, unspecified: Secondary | ICD-10-CM | POA: Diagnosis not present

## 2018-11-17 DIAGNOSIS — I429 Cardiomyopathy, unspecified: Secondary | ICD-10-CM | POA: Diagnosis not present

## 2018-11-17 DIAGNOSIS — B029 Zoster without complications: Secondary | ICD-10-CM | POA: Diagnosis not present

## 2018-11-17 DIAGNOSIS — I4891 Unspecified atrial fibrillation: Secondary | ICD-10-CM | POA: Diagnosis not present

## 2018-11-17 DIAGNOSIS — Z6834 Body mass index (BMI) 34.0-34.9, adult: Secondary | ICD-10-CM | POA: Diagnosis not present

## 2018-11-18 DIAGNOSIS — M159 Polyosteoarthritis, unspecified: Secondary | ICD-10-CM | POA: Diagnosis not present

## 2018-11-18 DIAGNOSIS — I509 Heart failure, unspecified: Secondary | ICD-10-CM | POA: Diagnosis not present

## 2018-11-18 DIAGNOSIS — I10 Essential (primary) hypertension: Secondary | ICD-10-CM | POA: Diagnosis not present

## 2018-11-20 ENCOUNTER — Other Ambulatory Visit (HOSPITAL_COMMUNITY): Payer: Medicare Other

## 2018-11-20 ENCOUNTER — Other Ambulatory Visit: Payer: Medicare Other

## 2018-12-03 ENCOUNTER — Ambulatory Visit: Payer: Medicare Other

## 2018-12-15 ENCOUNTER — Encounter (HOSPITAL_COMMUNITY): Payer: Self-pay

## 2018-12-15 DIAGNOSIS — Z6836 Body mass index (BMI) 36.0-36.9, adult: Secondary | ICD-10-CM | POA: Diagnosis not present

## 2018-12-15 DIAGNOSIS — I10 Essential (primary) hypertension: Secondary | ICD-10-CM | POA: Diagnosis not present

## 2018-12-15 DIAGNOSIS — Z299 Encounter for prophylactic measures, unspecified: Secondary | ICD-10-CM | POA: Diagnosis not present

## 2018-12-15 DIAGNOSIS — N390 Urinary tract infection, site not specified: Secondary | ICD-10-CM | POA: Diagnosis not present

## 2018-12-15 DIAGNOSIS — R300 Dysuria: Secondary | ICD-10-CM | POA: Diagnosis not present

## 2018-12-15 DIAGNOSIS — I429 Cardiomyopathy, unspecified: Secondary | ICD-10-CM | POA: Diagnosis not present

## 2018-12-18 ENCOUNTER — Ambulatory Visit (INDEPENDENT_AMBULATORY_CARE_PROVIDER_SITE_OTHER): Payer: Medicare Other | Admitting: *Deleted

## 2018-12-18 DIAGNOSIS — Z1339 Encounter for screening examination for other mental health and behavioral disorders: Secondary | ICD-10-CM | POA: Diagnosis not present

## 2018-12-18 DIAGNOSIS — Z299 Encounter for prophylactic measures, unspecified: Secondary | ICD-10-CM | POA: Diagnosis not present

## 2018-12-18 DIAGNOSIS — Z1211 Encounter for screening for malignant neoplasm of colon: Secondary | ICD-10-CM | POA: Diagnosis not present

## 2018-12-18 DIAGNOSIS — Z0000 Encounter for general adult medical examination without abnormal findings: Secondary | ICD-10-CM | POA: Diagnosis not present

## 2018-12-18 DIAGNOSIS — Z1331 Encounter for screening for depression: Secondary | ICD-10-CM | POA: Diagnosis not present

## 2018-12-18 DIAGNOSIS — R5383 Other fatigue: Secondary | ICD-10-CM | POA: Diagnosis not present

## 2018-12-18 DIAGNOSIS — Z6836 Body mass index (BMI) 36.0-36.9, adult: Secondary | ICD-10-CM | POA: Diagnosis not present

## 2018-12-18 DIAGNOSIS — I428 Other cardiomyopathies: Secondary | ICD-10-CM

## 2018-12-18 DIAGNOSIS — I4891 Unspecified atrial fibrillation: Secondary | ICD-10-CM | POA: Diagnosis not present

## 2018-12-18 DIAGNOSIS — Z79899 Other long term (current) drug therapy: Secondary | ICD-10-CM | POA: Diagnosis not present

## 2018-12-18 DIAGNOSIS — Z23 Encounter for immunization: Secondary | ICD-10-CM | POA: Diagnosis not present

## 2018-12-18 DIAGNOSIS — B0229 Other postherpetic nervous system involvement: Secondary | ICD-10-CM | POA: Diagnosis not present

## 2018-12-18 DIAGNOSIS — Z7189 Other specified counseling: Secondary | ICD-10-CM | POA: Diagnosis not present

## 2018-12-18 DIAGNOSIS — I10 Essential (primary) hypertension: Secondary | ICD-10-CM | POA: Diagnosis not present

## 2018-12-18 DIAGNOSIS — E7800 Pure hypercholesterolemia, unspecified: Secondary | ICD-10-CM | POA: Diagnosis not present

## 2018-12-18 DIAGNOSIS — I509 Heart failure, unspecified: Secondary | ICD-10-CM | POA: Diagnosis not present

## 2018-12-20 ENCOUNTER — Other Ambulatory Visit (HOSPITAL_COMMUNITY): Payer: Self-pay | Admitting: Cardiology

## 2018-12-21 ENCOUNTER — Other Ambulatory Visit (HOSPITAL_COMMUNITY): Payer: Self-pay | Admitting: Cardiology

## 2018-12-21 ENCOUNTER — Other Ambulatory Visit (HOSPITAL_COMMUNITY): Payer: Self-pay

## 2018-12-21 LAB — CUP PACEART REMOTE DEVICE CHECK
Battery Remaining Longevity: 125 mo
Battery Remaining Percentage: 95.5 %
Battery Voltage: 3.01 V
Brady Statistic RV Percent Paced: 95 %
Date Time Interrogation Session: 20201016060015
Implantable Lead Implant Date: 20190719
Implantable Lead Implant Date: 20190719
Implantable Lead Location: 753859
Implantable Lead Location: 753860
Implantable Lead Model: 5076
Implantable Lead Model: 5076
Implantable Pulse Generator Implant Date: 20190719
Lead Channel Impedance Value: 490 Ohm
Lead Channel Pacing Threshold Amplitude: 0.5 V
Lead Channel Pacing Threshold Pulse Width: 0.5 ms
Lead Channel Sensing Intrinsic Amplitude: 12 mV
Lead Channel Setting Pacing Amplitude: 2 V
Lead Channel Setting Pacing Pulse Width: 0.5 ms
Lead Channel Setting Sensing Sensitivity: 4 mV
Pulse Gen Model: 3222
Pulse Gen Serial Number: 9022392

## 2018-12-21 MED ORDER — CARVEDILOL 12.5 MG PO TABS: 12.5000 mg | ORAL_TABLET | Freq: Two times a day (BID) | ORAL | 3 refills | Status: DC

## 2018-12-23 NOTE — Progress Notes (Signed)
Remote pacemaker transmission.   

## 2018-12-25 ENCOUNTER — Other Ambulatory Visit: Payer: Medicare Other | Admitting: *Deleted

## 2018-12-25 ENCOUNTER — Other Ambulatory Visit (HOSPITAL_COMMUNITY)
Admission: RE | Admit: 2018-12-25 | Discharge: 2018-12-25 | Disposition: A | Discharge status: Home / Self Care | Payer: Medicare Other | Source: Ambulatory Visit | Attending: Internal Medicine | Admitting: Internal Medicine

## 2018-12-25 ENCOUNTER — Telehealth: Payer: Self-pay

## 2018-12-25 ENCOUNTER — Other Ambulatory Visit: Payer: Self-pay

## 2018-12-25 DIAGNOSIS — I428 Other cardiomyopathies: Secondary | ICD-10-CM

## 2018-12-25 DIAGNOSIS — Z01812 Encounter for preprocedural laboratory examination: Secondary | ICD-10-CM | POA: Diagnosis not present

## 2018-12-25 DIAGNOSIS — I447 Left bundle-branch block, unspecified: Secondary | ICD-10-CM | POA: Diagnosis not present

## 2018-12-25 DIAGNOSIS — Z20828 Contact with and (suspected) exposure to other viral communicable diseases: Secondary | ICD-10-CM | POA: Diagnosis not present

## 2018-12-25 LAB — CBC WITH DIFFERENTIAL/PLATELET
Basophils Absolute: 0 10*3/uL (ref 0.0–0.2)
Basos: 0 %
EOS (ABSOLUTE): 0 10*3/uL (ref 0.0–0.4)
Eos: 1 %
Hematocrit: 29.6 % — ABNORMAL LOW (ref 34.0–46.6)
Hemoglobin: 9.9 g/dL — ABNORMAL LOW (ref 11.1–15.9)
Immature Grans (Abs): 0 10*3/uL (ref 0.0–0.1)
Immature Granulocytes: 0 %
Lymphocytes Absolute: 0.3 10*3/uL — ABNORMAL LOW (ref 0.7–3.1)
Lymphs: 5 %
MCH: 34.4 pg — ABNORMAL HIGH (ref 26.6–33.0)
MCHC: 33.4 g/dL (ref 31.5–35.7)
MCV: 103 fL — ABNORMAL HIGH (ref 79–97)
Monocytes Absolute: 0.6 10*3/uL (ref 0.1–0.9)
Monocytes: 12 %
Neutrophils Absolute: 3.8 10*3/uL (ref 1.4–7.0)
Neutrophils: 82 %
Platelets: 151 10*3/uL (ref 150–450)
RBC: 2.88 x10E6/uL — ABNORMAL LOW (ref 3.77–5.28)
RDW: 15.2 % (ref 11.7–15.4)
WBC: 4.7 10*3/uL (ref 3.4–10.8)

## 2018-12-25 LAB — BASIC METABOLIC PANEL
BUN/Creatinine Ratio: 28 (ref 12–28)
BUN: 99 mg/dL (ref 8–27)
CO2: 23 mmol/L (ref 20–29)
Calcium: 8.7 mg/dL (ref 8.7–10.3)
Chloride: 95 mmol/L — ABNORMAL LOW (ref 96–106)
Creatinine, Ser: 3.6 mg/dL — ABNORMAL HIGH (ref 0.57–1.00)
GFR calc Af Amer: 13 mL/min/{1.73_m2} — ABNORMAL LOW (ref >59)
GFR calc non Af Amer: 11 mL/min/{1.73_m2} — ABNORMAL LOW (ref >59)
Glucose: 119 mg/dL — ABNORMAL HIGH (ref 65–99)
Potassium: 3.9 mmol/L (ref 3.5–5.2)
Sodium: 138 mmol/L (ref 134–144)

## 2018-12-25 NOTE — Telephone Encounter (Signed)
Call placed to Pt.  Advised to arrive for procedure at 11:00 am on Monday October 26.  Pt indicates understanding.

## 2018-12-26 ENCOUNTER — Other Ambulatory Visit (HOSPITAL_COMMUNITY): Payer: Self-pay | Admitting: Cardiology

## 2018-12-26 LAB — NOVEL CORONAVIRUS, NAA (HOSP ORDER, SEND-OUT TO REF LAB; TAT 18-24 HRS): SARS-CoV-2, NAA: NOT DETECTED

## 2018-12-28 ENCOUNTER — Other Ambulatory Visit: Payer: Self-pay

## 2018-12-28 ENCOUNTER — Ambulatory Visit (HOSPITAL_COMMUNITY)
Admission: RE | Disposition: A | Discharge status: Home / Self Care | Payer: Self-pay | Source: Home / Self Care | Attending: Internal Medicine

## 2018-12-28 ENCOUNTER — Ambulatory Visit (HOSPITAL_COMMUNITY)
Admission: RE | Admit: 2018-12-28 | Discharge: 2018-12-28 | Disposition: A | Discharge status: Home / Self Care | Payer: Medicare Other | Attending: Internal Medicine | Admitting: Internal Medicine

## 2018-12-28 DIAGNOSIS — Z4502 Encounter for adjustment and management of automatic implantable cardiac defibrillator: Secondary | ICD-10-CM | POA: Insufficient documentation

## 2018-12-28 DIAGNOSIS — I5022 Chronic systolic (congestive) heart failure: Secondary | ICD-10-CM | POA: Insufficient documentation

## 2018-12-28 DIAGNOSIS — I5042 Chronic combined systolic (congestive) and diastolic (congestive) heart failure: Secondary | ICD-10-CM | POA: Diagnosis present

## 2018-12-28 DIAGNOSIS — I428 Other cardiomyopathies: Secondary | ICD-10-CM | POA: Diagnosis not present

## 2018-12-28 DIAGNOSIS — I447 Left bundle-branch block, unspecified: Secondary | ICD-10-CM | POA: Diagnosis not present

## 2018-12-28 DIAGNOSIS — I4891 Unspecified atrial fibrillation: Secondary | ICD-10-CM | POA: Diagnosis not present

## 2018-12-28 DIAGNOSIS — M1990 Unspecified osteoarthritis, unspecified site: Secondary | ICD-10-CM | POA: Insufficient documentation

## 2018-12-28 DIAGNOSIS — I110 Hypertensive heart disease with heart failure: Secondary | ICD-10-CM | POA: Insufficient documentation

## 2018-12-28 DIAGNOSIS — Z79899 Other long term (current) drug therapy: Secondary | ICD-10-CM | POA: Insufficient documentation

## 2018-12-28 DIAGNOSIS — G4733 Obstructive sleep apnea (adult) (pediatric): Secondary | ICD-10-CM | POA: Insufficient documentation

## 2018-12-28 DIAGNOSIS — Z539 Procedure and treatment not carried out, unspecified reason: Secondary | ICD-10-CM | POA: Insufficient documentation

## 2018-12-28 DIAGNOSIS — Z881 Allergy status to other antibiotic agents status: Secondary | ICD-10-CM | POA: Insufficient documentation

## 2018-12-28 DIAGNOSIS — I701 Atherosclerosis of renal artery: Secondary | ICD-10-CM | POA: Diagnosis not present

## 2018-12-28 DIAGNOSIS — E669 Obesity, unspecified: Secondary | ICD-10-CM | POA: Diagnosis not present

## 2018-12-28 DIAGNOSIS — Z8249 Family history of ischemic heart disease and other diseases of the circulatory system: Secondary | ICD-10-CM | POA: Diagnosis not present

## 2018-12-28 DIAGNOSIS — Z6832 Body mass index (BMI) 32.0-32.9, adult: Secondary | ICD-10-CM | POA: Diagnosis not present

## 2018-12-28 DIAGNOSIS — I442 Atrioventricular block, complete: Secondary | ICD-10-CM

## 2018-12-28 DIAGNOSIS — I2510 Atherosclerotic heart disease of native coronary artery without angina pectoris: Secondary | ICD-10-CM | POA: Insufficient documentation

## 2018-12-28 DIAGNOSIS — Z87891 Personal history of nicotine dependence: Secondary | ICD-10-CM | POA: Insufficient documentation

## 2018-12-28 DIAGNOSIS — Z7901 Long term (current) use of anticoagulants: Secondary | ICD-10-CM | POA: Insufficient documentation

## 2018-12-28 HISTORY — PX: BIV PACEMAKER INSERTION CRT-P: EP1199

## 2018-12-28 LAB — SURGICAL PCR SCREEN
MRSA, PCR: NEGATIVE
Staphylococcus aureus: NEGATIVE

## 2018-12-28 SURGERY — BIV PACEMAKER INSERTION CRT-P

## 2018-12-28 MED ORDER — CHLORHEXIDINE GLUCONATE 4 % EX LIQD
60.0000 mL | Freq: Once | CUTANEOUS | Status: DC
  Filled 2018-12-28: qty 60

## 2018-12-28 MED ORDER — VANCOMYCIN HCL IN DEXTROSE 1-5 GM/200ML-% IV SOLN
INTRAVENOUS | Status: AC
  Filled 2018-12-28: qty 200

## 2018-12-28 MED ORDER — VANCOMYCIN HCL IN DEXTROSE 1-5 GM/200ML-% IV SOLN
1000.0000 mg | INTRAVENOUS | Status: AC
  Administered 2018-12-28: 1000 mg via INTRAVENOUS

## 2018-12-28 MED ORDER — HEPARIN (PORCINE) IN NACL 1000-0.9 UT/500ML-% IV SOLN: INTRAVENOUS | Status: DC | PRN

## 2018-12-28 MED ORDER — ONDANSETRON HCL 4 MG/2ML IJ SOLN: 4.0000 mg | Freq: Four times a day (QID) | INTRAMUSCULAR | Status: DC | PRN

## 2018-12-28 MED ORDER — ACETAMINOPHEN 325 MG PO TABS: 325.0000 mg | ORAL_TABLET | ORAL | Status: DC | PRN

## 2018-12-28 MED ORDER — SODIUM CHLORIDE 0.9 % IV SOLN
INTRAVENOUS | Status: AC
  Filled 2018-12-28: qty 2

## 2018-12-28 MED ORDER — SODIUM CHLORIDE 0.9 % IV SOLN: 80.0000 mg | INTRAVENOUS | Status: DC

## 2018-12-28 MED ORDER — LIDOCAINE HCL 1 % IJ SOLN
INTRAMUSCULAR | Status: AC
  Filled 2018-12-28: qty 60

## 2018-12-28 MED ORDER — SODIUM CHLORIDE 0.9 % IV SOLN
INTRAVENOUS | Status: DC
  Administered 2018-12-28: 12:00:00 via INTRAVENOUS

## 2018-12-28 MED ORDER — MUPIROCIN 2 % EX OINT
1.0000 "application " | TOPICAL_OINTMENT | Freq: Once | CUTANEOUS | Status: AC
  Administered 2018-12-28: 1 via TOPICAL
  Filled 2018-12-28: qty 22

## 2018-12-28 MED ORDER — LIDOCAINE HCL (PF) 1 % IJ SOLN: INTRAMUSCULAR | Status: DC | PRN

## 2018-12-28 MED ORDER — IOHEXOL 350 MG/ML SOLN
INTRAVENOUS | Status: DC | PRN
  Administered 2018-12-28: 25 mL

## 2018-12-28 MED ORDER — MUPIROCIN 2 % EX OINT
TOPICAL_OINTMENT | CUTANEOUS | Status: AC
  Filled 2018-12-28: qty 22

## 2018-12-28 MED ORDER — HEPARIN (PORCINE) IN NACL 1000-0.9 UT/500ML-% IV SOLN
INTRAVENOUS | Status: AC
  Filled 2018-12-28: qty 500

## 2018-12-28 SURGICAL SUPPLY — 4 items
CABLE SURGICAL S-101-97-12 (CABLE) ×2 IMPLANT
KIT ESSENTIALS PG (KITS) IMPLANT
PAD PRO RADIOLUCENT 2001M-C (PAD) ×2 IMPLANT
TRAY PACEMAKER INSERTION (PACKS) ×2 IMPLANT

## 2018-12-28 NOTE — H&P (Signed)
HPI [name] is referred today by Dr. Aundra Dubin for consideration for LV lead/His bundle lead placement. She has a h/o uncontrolled atrial fib and recent development of severe LV dysfunction, EF 25%. She has not had syncope. Since her AV node ablation, she is improved. She has class 2-3 CHF symptoms. She does have worsening renal failure, and is now stage 4, approaching 5.       Allergies  Allergen Reactions  . Cephalexin Rash    REACTION: Rash to arms/legs           Current Outpatient Medications  Medication Sig Dispense Refill  . acetaminophen (TYLENOL) 325 MG tablet Take 2 tablets (650 mg total) by mouth every 4 (four) hours as needed for headache or mild pain.    [name] amiodarone (PACERONE) 200 MG tablet Take 1 tablet (200 mg total) by mouth daily. 60 tablet 3  . calcitRIOL (ROCALTROL) 0.25 MCG capsule Take 0.25 mcg by mouth daily.    . carvedilol (COREG) 12.5 MG tablet Take 1 tablet (12.5 mg total) by mouth 2 (two) times daily. 90 tablet 3  . ELIQUIS 5 MG TABS tablet TAKE 1 TABLET(5 MG) BY MOUTH TWICE DAILY 60 tablet 3  . metolazone (ZAROXOLYN) 2.5 MG tablet Take 1 tablet by mouth once a week.    . Multiple Vitamin (MULTIVITAMIN) tablet Take 1 tablet by mouth daily.    . potassium chloride SA (K-DUR,KLOR-CON) 20 MEQ tablet Take 1 tablet (20 mEq total) by mouth daily. 90 tablet 3  . spironolactone (ALDACTONE) 25 MG tablet Take 1 tablet by mouth daily.    [name] torsemide (DEMADEX) 20 MG tablet Take 2 tablets (40 mg total) by mouth daily. 180 tablet 2   No current facility-administered medications for this visit.          Past Medical History:  Diagnosis Date  . Acute renal failure [facility])     hospitalized... December 08, 2009... improved with hydration in the hospital  . Anemia    further workup needed... October, 2011.  [name] Aortic insufficiency    mild...echo... June 2 010  . Arthritis    Hips/Knees, severe, requiring a walker  . Asymmetric  septal hypertrophy (HCC)    echo....04/2009....patient encouraged the family to be screened elsewhere  . Atrial fibrillation ([place])     ??? atrial fibrillation during hospitalization October, 2011 ???..  . Bradycardia   . CAD (coronary artery disease)    Mild coronary disease, catheterization, 2009  . Cardiomyopathy, nonischemic ([place])    EF improved October, 2011  . Ejection fraction    EF 25%...nondiagnostic MRI December 2009...  /  echo June, 2010  /   echo... December 08, 2009.... ejection fraction improved... EF 60% /  EF 30%... echo.... [facility]... December 28, 2009 with CHFPLanned ICD / CRT... patient has seen Dr.Klein..EF improved October, 2011   . Groin hematoma    Right-hematoma/abscess..repaired.. December, 2009  . Hypertension   . LBBB (left bundle branch block)   . LVH (left ventricular hypertrophy)    moderately severe... echo.. June, 2010  /  EF improved October, 2011.... cancel plans for ICD  . Mitral regurgitation    mild/ moderate...echo June, 2010  /   echo.. October, 2011.... mitral regurgitation  improved  . OSA (obstructive sleep apnea)   . Renal artery stenosis (HCC)    80% upper left   . Systolic heart failure    chronic    ROS:   All systems reviewed and  negative except as noted in the HPI.        Past Surgical History:  Procedure Laterality Date  . ABDOMINAL HYSTERECTOMY    . AV NODE ABLATION N/A 09/19/2017   Procedure: AV NODE ABLATION;  Surgeon: Deboraha Sprang, MD;  Location: [place] [facility];  Service: Cardiovascular;  Laterality: N/A;  . BIV PACEMAKER INSERTION CRT-P N/A 09/19/2017   Procedure: BIV PACEMAKER INSERTION CRT-P;  Surgeon: Deboraha Sprang, MD;  Location: [place] [facility];  Service: Cardiovascular;  Laterality: N/A;  . CARDIAC CATHETERIZATION N/A 08/14/2015   Procedure: Right/Left Heart Cath and Coronary Angiography;  Surgeon: Leonie Man, MD;  Location: [place] [facility];  Service: Cardiovascular;   Laterality: N/A;  . CARDIOVERSION N/A 09/11/2017   Procedure: CARDIOVERSION;  Surgeon: Larey Dresser, MD;  Location: [facility]COPY;  Service: Cardiovascular;  Laterality: N/A;  . CARDIOVERSION N/A 09/14/2017   Procedure: CARDIOVERSION;  Surgeon: Larey Dresser, MD;  Location: [facility];  Service: Cardiovascular;  Laterality: N/A;  . CARDIOVERSION N/A 09/18/2017   Procedure: CARDIOVERSION FOR ATRIAL FIBRILLATION;  Surgeon: Larey Dresser, MD;  Location: [facility] ENDOSCOPY;  Service: Cardiovascular;  Laterality: N/A;  . REPLACEMENT TOTAL HIP W/  RESURFACING IMPLANTS    . REPLACEMENT TOTAL KNEE    . Surgical Repair of a Catheteriztion associated femoral artery injury            Family History  Problem Relation Age of Onset  . Rheumatic fever Mother        in her early 103's  . Alzheimer's disease Mother   . Stomach cancer Father 42       died 53  . Bradycardia Brother        has pacemaker  . Heart failure Paternal Grandmother   . Heart failure Paternal Grandfather      Social History        Socioeconomic History  . Marital status: Married    Spouse name: Not on file  . Number of children: 0  . Years of education: Not on file  . Highest education level: Not on file  Occupational History  . Not on file  Social Needs  . Financial resource strain: Not on file  . Food insecurity    Worry: Not on file    Inability: Not on file  . Transportation needs    Medical: Not on file    Non-medical: Not on file  Tobacco Use  . Smoking status: Former Smoker    Packs/day: 1.00    Years: 24.00    Pack years: 24.00    Types: Cigarettes    Start date: 10/29/1958    Quit date: 12/02/1985    Years since quitting: 32.8  . Smokeless tobacco: Never Used  Substance and Sexual Activity  . Alcohol use: No    Alcohol/week: 0.0 standard drinks  . Drug use: Not on file  . Sexual activity: Not on file  Lifestyle  . Physical activity    Days per  week: Not on file    Minutes per session: Not on file  . Stress: Not on file  Relationships  . Social Herbalist on phone: Not on file    Gets together: Not on file    Attends religious service: Not on file    Active member of club or organization: Not on file    Attends meetings of clubs or organizations: Not on file    Relationship status: Not on file  .  Intimate partner violence    Fear of current or ex partner: Not on file    Emotionally abused: Not on file    Physically abused: Not on file    Forced sexual activity: Not on file  Other Topics Concern  . Not on file  Social History Narrative  . Not on file     BP 110/74   Pulse 70   Ht 5\' 4"  (1.626 m)   Wt 189 lb (85.7 kg)   SpO2 97%   BMI 32.44 kg/m   Physical Exam:  Well appearing NAD HEENT: Unremarkable Neck:  No JVD, no thyromegally Lymphatics:  No adenopathy Back:  No CVA tenderness Lungs:  Clear with no wheezes HEART:  Regular rate rhythm, no murmurs, no rubs, no clicks Abd:  soft, positive bowel sounds, no organomegally, no rebound, no guarding Ext:  2 plus pulses, no edema, no cyanosis, no clubbing Skin:  No rashes no nodules Neuro:  CN II through XII intact, motor grossly intact  EKG - reviewed. Atrial fib with ventricular pacing, QRS 238 ms!  DEVICE  Not evaluated today  Assess/Plan: 1. Chronic systolic heart failure - she has pacing induced LBBB with a markedly prolonged QRS. I have discussed the treatment options with the patient and offered her attempted biv upgrade. She will call us if she would like to undergo attempted upgrade.  2. Obesity - she needs to lose weight.   Ponciano Ort.  EP Attending  Patient seen and examined. Agree with the findings as noted above. The patient is doing well and presents today for upgrade of her DDD PM to a Biv PPM. I have reviewed the indications/risks/benefits/goals/expectations and she wishes to proceed.  Mikle Bosworth.D.

## 2018-12-28 NOTE — Discharge Instructions (Signed)
Venogram, Care After °This sheet gives you information about how to care for yourself after your procedure. Your health care provider may also give you more specific instructions. If you have problems or questions, contact your health care provider. °What can I expect after the procedure? °After the procedure, it is common to have: °· Bruising or mild discomfort in the area where the IV was inserted (insertion site). °Follow these instructions at home: °Eating and drinking ° °· Follow instructions from your health care provider about eating or drinking restrictions. °· Drink a lot of fluids for the first several days after the procedure, as directed by your health care provider. This helps to wash (flush) the contrast out of your body. Examples of healthy fluids include water or low-calorie drinks. °General instructions °· Check your IV insertion area every day for signs of infection. Check for: °? Redness, swelling, or pain. °? Fluid or blood. °? Warmth. °? Pus or a bad smell. °· Take over-the-counter and prescription medicines only as told by your health care provider. °· Rest and return to your normal activities as told by your health care provider. Ask your health care provider what activities are safe for you. °· Do not drive for 24 hours if you were given a medicine to help you relax (sedative), or until your health care provider approves. °· Keep all follow-up visits as told by your health care provider. This is important. °Contact a health care provider if: °· Your skin becomes itchy or you develop a rash or hives. °· You have a fever that does not get better with medicine. °· You feel nauseous. °· You vomit. °· You have redness, swelling, or pain around the insertion site. °· You have fluid or blood coming from the insertion site. °· Your insertion area feels warm to the touch. °· You have pus or a bad smell coming from the insertion site. °Get help right away if: °· You have difficulty breathing or  shortness of breath. °· You develop chest pain. °· You faint. °· You feel very dizzy. °These symptoms may represent a serious problem that is an emergency. Do not wait to see if the symptoms will go away. Get medical help right away. Call your local emergency services (911 in the [place] Do not drive yourself to the hospital. °Summary °· After your procedure, it is common to have bruising or mild discomfort in the area where the IV was inserted. °· You should check your IV insertion area every day for signs of infection. °· Take over-the-counter and prescription medicines only as told by your health care provider. °· You should drink a lot of fluids for the first several days after the procedure to help flush the contrast from your body. °This information is not intended to replace advice given to you by your health care provider. Make sure you discuss any questions you have with your health care provider. °Document Released: 12/09/2012 Document Revised: 01/31/2017 Document Reviewed: 01/13/2016 °Elsevier Patient Education © 2020 [employer] ° °

## 2018-12-29 ENCOUNTER — Encounter (HOSPITAL_COMMUNITY): Payer: Self-pay | Admitting: Internal Medicine

## 2018-12-29 MED FILL — Heparin Sod (Porcine)-NaCl IV Soln 1000 Unit/500ML-0.9%: INTRAVENOUS | Qty: 500 | Status: AC

## 2018-12-29 MED FILL — Lidocaine HCl Local Inj 1%: INTRAMUSCULAR | Qty: 60 | Status: AC

## 2019-01-06 ENCOUNTER — Encounter (HOSPITAL_COMMUNITY): Payer: Medicare Other | Admitting: Cardiology

## 2019-01-06 DIAGNOSIS — E213 Hyperparathyroidism, unspecified: Secondary | ICD-10-CM | POA: Diagnosis not present

## 2019-01-06 DIAGNOSIS — I2510 Atherosclerotic heart disease of native coronary artery without angina pectoris: Secondary | ICD-10-CM | POA: Diagnosis not present

## 2019-01-06 DIAGNOSIS — N189 Chronic kidney disease, unspecified: Secondary | ICD-10-CM | POA: Diagnosis not present

## 2019-01-06 DIAGNOSIS — I509 Heart failure, unspecified: Secondary | ICD-10-CM | POA: Diagnosis not present

## 2019-01-06 DIAGNOSIS — N185 Chronic kidney disease, stage 5: Secondary | ICD-10-CM | POA: Diagnosis not present

## 2019-01-06 DIAGNOSIS — D649 Anemia, unspecified: Secondary | ICD-10-CM | POA: Diagnosis not present

## 2019-01-06 DIAGNOSIS — N2581 Secondary hyperparathyroidism of renal origin: Secondary | ICD-10-CM | POA: Diagnosis not present

## 2019-01-07 ENCOUNTER — Ambulatory Visit: Payer: Medicare Other

## 2019-01-22 DIAGNOSIS — N185 Chronic kidney disease, stage 5: Secondary | ICD-10-CM | POA: Diagnosis not present

## 2019-01-22 DIAGNOSIS — N189 Chronic kidney disease, unspecified: Secondary | ICD-10-CM | POA: Diagnosis not present

## 2019-01-25 ENCOUNTER — Telehealth (HOSPITAL_COMMUNITY): Payer: Self-pay

## 2019-01-25 NOTE — Telephone Encounter (Signed)
PT called and left v/m stating she would like to schedule an appt ASAP.  Returned PT's call and received her v/m...left message for PT to return the call to schedule the requested appt per her message that was received.

## 2019-01-25 NOTE — Telephone Encounter (Signed)
  Please call.   Remind her that she should not be taking spironolactone.   Instruct to increase torsemide to 40 mg twice a day and increase potassium to 40 meq  x2  Days.    Take 2.5 mg metolazone today. Needs BMET next week.   Please ask her to make follow up with Nephrology. Looking back at her lab work this seems to be getting worse.   Shawnya Mayor NP_C  3:26 PM

## 2019-01-25 NOTE — Telephone Encounter (Signed)
Patient left message on triage line with concerns if increased fluid and SOB  Patient report she is unable to walk 10 ft without becoming extremely SOB. Increased swelling in abdomen, increased weight (20 lbs). Patient contributed weight gain to her thyroid, managed by PCP. Compliant with medications and diet. Would like appt ASAP.  Add on appt given to patient for 11/25 @ 1030. Advised would forward to provider for medication adjustments until she is able to be seen. Advised to report to ER if symptoms got worse  Pt voiced understanding and appreciative of returned call

## 2019-01-25 NOTE — Telephone Encounter (Signed)
Pt aware and voiced understanding Reports she has been keeping follow up appts with ne[phroogy

## 2019-01-26 ENCOUNTER — Telehealth (HOSPITAL_COMMUNITY): Payer: Self-pay

## 2019-01-26 NOTE — Telephone Encounter (Signed)
Attempted to contact Patient for Pre Covid Screening. No answer and sent to Voicemail.

## 2019-01-27 ENCOUNTER — Inpatient Hospital Stay (HOSPITAL_COMMUNITY)
Admission: EM | Admit: 2019-01-27 | Discharge: 2019-02-06 | DRG: 264 | Disposition: A | Discharge status: Home Health | Payer: Medicare Other | Source: Ambulatory Visit | Attending: Internal Medicine | Admitting: Internal Medicine

## 2019-01-27 ENCOUNTER — Other Ambulatory Visit: Payer: Self-pay

## 2019-01-27 ENCOUNTER — Ambulatory Visit (HOSPITAL_COMMUNITY)
Admission: RE | Admit: 2019-01-27 | Discharge: 2019-01-27 | Disposition: A | Discharge status: Home / Self Care | Payer: Medicare Other | Source: Ambulatory Visit | Attending: Cardiology | Admitting: Cardiology

## 2019-01-27 ENCOUNTER — Encounter (HOSPITAL_COMMUNITY): Payer: Self-pay | Admitting: Emergency Medicine

## 2019-01-27 ENCOUNTER — Emergency Department (HOSPITAL_COMMUNITY): Payer: Medicare Other

## 2019-01-27 ENCOUNTER — Encounter (HOSPITAL_COMMUNITY): Payer: Self-pay

## 2019-01-27 DIAGNOSIS — N186 End stage renal disease: Secondary | ICD-10-CM

## 2019-01-27 DIAGNOSIS — R5381 Other malaise: Secondary | ICD-10-CM | POA: Diagnosis not present

## 2019-01-27 DIAGNOSIS — I447 Left bundle-branch block, unspecified: Secondary | ICD-10-CM | POA: Diagnosis present

## 2019-01-27 DIAGNOSIS — I5082 Biventricular heart failure: Secondary | ICD-10-CM | POA: Diagnosis present

## 2019-01-27 DIAGNOSIS — I428 Other cardiomyopathies: Secondary | ICD-10-CM | POA: Diagnosis present

## 2019-01-27 DIAGNOSIS — L89311 Pressure ulcer of right buttock, stage 1: Secondary | ICD-10-CM | POA: Diagnosis present

## 2019-01-27 DIAGNOSIS — I509 Heart failure, unspecified: Secondary | ICD-10-CM

## 2019-01-27 DIAGNOSIS — I871 Compression of vein: Secondary | ICD-10-CM | POA: Diagnosis present

## 2019-01-27 DIAGNOSIS — I2510 Atherosclerotic heart disease of native coronary artery without angina pectoris: Secondary | ICD-10-CM | POA: Diagnosis present

## 2019-01-27 DIAGNOSIS — Z20828 Contact with and (suspected) exposure to other viral communicable diseases: Secondary | ICD-10-CM | POA: Diagnosis present

## 2019-01-27 DIAGNOSIS — N185 Chronic kidney disease, stage 5: Secondary | ICD-10-CM

## 2019-01-27 DIAGNOSIS — I701 Atherosclerosis of renal artery: Secondary | ICD-10-CM | POA: Diagnosis present

## 2019-01-27 DIAGNOSIS — G4733 Obstructive sleep apnea (adult) (pediatric): Secondary | ICD-10-CM

## 2019-01-27 DIAGNOSIS — E6609 Other obesity due to excess calories: Secondary | ICD-10-CM | POA: Diagnosis not present

## 2019-01-27 DIAGNOSIS — M25562 Pain in left knee: Secondary | ICD-10-CM | POA: Diagnosis not present

## 2019-01-27 DIAGNOSIS — I110 Hypertensive heart disease with heart failure: Secondary | ICD-10-CM | POA: Diagnosis not present

## 2019-01-27 DIAGNOSIS — R0989 Other specified symptoms and signs involving the circulatory and respiratory systems: Secondary | ICD-10-CM | POA: Diagnosis not present

## 2019-01-27 DIAGNOSIS — Z950 Presence of cardiac pacemaker: Secondary | ICD-10-CM

## 2019-01-27 DIAGNOSIS — Z6836 Body mass index (BMI) 36.0-36.9, adult: Secondary | ICD-10-CM | POA: Diagnosis not present

## 2019-01-27 DIAGNOSIS — I10 Essential (primary) hypertension: Secondary | ICD-10-CM

## 2019-01-27 DIAGNOSIS — N179 Acute kidney failure, unspecified: Secondary | ICD-10-CM | POA: Diagnosis present

## 2019-01-27 DIAGNOSIS — Z7901 Long term (current) use of anticoagulants: Secondary | ICD-10-CM

## 2019-01-27 DIAGNOSIS — I5033 Acute on chronic diastolic (congestive) heart failure: Secondary | ICD-10-CM | POA: Diagnosis not present

## 2019-01-27 DIAGNOSIS — E875 Hyperkalemia: Secondary | ICD-10-CM | POA: Diagnosis present

## 2019-01-27 DIAGNOSIS — I340 Nonrheumatic mitral (valve) insufficiency: Secondary | ICD-10-CM

## 2019-01-27 DIAGNOSIS — L8990 Pressure ulcer of unspecified site, unspecified stage: Secondary | ICD-10-CM | POA: Insufficient documentation

## 2019-01-27 DIAGNOSIS — D638 Anemia in other chronic diseases classified elsewhere: Secondary | ICD-10-CM | POA: Diagnosis not present

## 2019-01-27 DIAGNOSIS — Z6837 Body mass index (BMI) 37.0-37.9, adult: Secondary | ICD-10-CM

## 2019-01-27 DIAGNOSIS — Z87891 Personal history of nicotine dependence: Secondary | ICD-10-CM

## 2019-01-27 DIAGNOSIS — M7989 Other specified soft tissue disorders: Secondary | ICD-10-CM | POA: Diagnosis not present

## 2019-01-27 DIAGNOSIS — D509 Iron deficiency anemia, unspecified: Secondary | ICD-10-CM | POA: Diagnosis present

## 2019-01-27 DIAGNOSIS — Z6835 Body mass index (BMI) 35.0-35.9, adult: Secondary | ICD-10-CM | POA: Diagnosis not present

## 2019-01-27 DIAGNOSIS — E039 Hypothyroidism, unspecified: Secondary | ICD-10-CM | POA: Diagnosis not present

## 2019-01-27 DIAGNOSIS — I5043 Acute on chronic combined systolic (congestive) and diastolic (congestive) heart failure: Secondary | ICD-10-CM | POA: Diagnosis not present

## 2019-01-27 DIAGNOSIS — I5023 Acute on chronic systolic (congestive) heart failure: Secondary | ICD-10-CM | POA: Diagnosis not present

## 2019-01-27 DIAGNOSIS — Z992 Dependence on renal dialysis: Secondary | ICD-10-CM | POA: Diagnosis not present

## 2019-01-27 DIAGNOSIS — I5022 Chronic systolic (congestive) heart failure: Secondary | ICD-10-CM | POA: Diagnosis not present

## 2019-01-27 DIAGNOSIS — I130 Hypertensive heart and chronic kidney disease with heart failure and stage 1 through stage 4 chronic kidney disease, or unspecified chronic kidney disease: Secondary | ICD-10-CM | POA: Diagnosis not present

## 2019-01-27 DIAGNOSIS — I493 Ventricular premature depolarization: Secondary | ICD-10-CM

## 2019-01-27 DIAGNOSIS — Z881 Allergy status to other antibiotic agents status: Secondary | ICD-10-CM

## 2019-01-27 DIAGNOSIS — Z96652 Presence of left artificial knee joint: Secondary | ICD-10-CM | POA: Diagnosis present

## 2019-01-27 DIAGNOSIS — L8940 Pressure ulcer of contiguous site of back, buttock and hip, unspecified stage: Secondary | ICD-10-CM | POA: Diagnosis not present

## 2019-01-27 DIAGNOSIS — I482 Chronic atrial fibrillation, unspecified: Secondary | ICD-10-CM | POA: Diagnosis present

## 2019-01-27 DIAGNOSIS — Z96649 Presence of unspecified artificial hip joint: Secondary | ICD-10-CM | POA: Diagnosis present

## 2019-01-27 DIAGNOSIS — G4730 Sleep apnea, unspecified: Secondary | ICD-10-CM | POA: Diagnosis present

## 2019-01-27 DIAGNOSIS — E876 Hypokalemia: Secondary | ICD-10-CM | POA: Diagnosis present

## 2019-01-27 DIAGNOSIS — D631 Anemia in chronic kidney disease: Secondary | ICD-10-CM | POA: Diagnosis present

## 2019-01-27 DIAGNOSIS — I132 Hypertensive heart and chronic kidney disease with heart failure and with stage 5 chronic kidney disease, or end stage renal disease: Secondary | ICD-10-CM | POA: Diagnosis not present

## 2019-01-27 DIAGNOSIS — I4891 Unspecified atrial fibrillation: Secondary | ICD-10-CM | POA: Diagnosis not present

## 2019-01-27 DIAGNOSIS — N189 Chronic kidney disease, unspecified: Secondary | ICD-10-CM | POA: Diagnosis not present

## 2019-01-27 DIAGNOSIS — Z79890 Hormone replacement therapy (postmenopausal): Secondary | ICD-10-CM

## 2019-01-27 DIAGNOSIS — Z86718 Personal history of other venous thrombosis and embolism: Secondary | ICD-10-CM

## 2019-01-27 DIAGNOSIS — Z4901 Encounter for fitting and adjustment of extracorporeal dialysis catheter: Secondary | ICD-10-CM | POA: Diagnosis not present

## 2019-01-27 DIAGNOSIS — M25462 Effusion, left knee: Secondary | ICD-10-CM

## 2019-01-27 DIAGNOSIS — Z01810 Encounter for preprocedural cardiovascular examination: Secondary | ICD-10-CM | POA: Diagnosis not present

## 2019-01-27 HISTORY — DX: Presence of cardiac pacemaker: Z95.0

## 2019-01-27 HISTORY — DX: Heart failure, unspecified: I50.9

## 2019-01-27 LAB — HEPATIC FUNCTION PANEL
ALT: 103 U/L — ABNORMAL HIGH (ref 0–44)
AST: 47 U/L — ABNORMAL HIGH (ref 15–41)
Albumin: 3.5 g/dL (ref 3.5–5.0)
Alkaline Phosphatase: 69 U/L (ref 38–126)
Bilirubin, Direct: 0.5 mg/dL — ABNORMAL HIGH (ref 0.0–0.2)
Indirect Bilirubin: 1.2 mg/dL — ABNORMAL HIGH (ref 0.3–0.9)
Total Bilirubin: 1.7 mg/dL — ABNORMAL HIGH (ref 0.3–1.2)
Total Protein: 6.4 g/dL — ABNORMAL LOW (ref 6.5–8.1)

## 2019-01-27 LAB — CBC
HCT: 33.9 % — ABNORMAL LOW (ref 36.0–46.0)
HCT: 32.5 % — ABNORMAL LOW (ref 36.0–46.0)
Hemoglobin: 10.5 g/dL — ABNORMAL LOW (ref 12.0–15.0)
Hemoglobin: 10.1 g/dL — ABNORMAL LOW (ref 12.0–15.0)
MCH: 33.4 pg (ref 26.0–34.0)
MCH: 33.3 pg (ref 26.0–34.0)
MCHC: 31 g/dL (ref 30.0–36.0)
MCHC: 31.1 g/dL (ref 30.0–36.0)
MCV: 108 fL — ABNORMAL HIGH (ref 80.0–100.0)
MCV: 107.3 fL — ABNORMAL HIGH (ref 80.0–100.0)
Platelets: 253 10*3/uL (ref 150–400)
Platelets: 234 10*3/uL (ref 150–400)
RBC: 3.14 MIL/uL — ABNORMAL LOW (ref 3.87–5.11)
RBC: 3.03 MIL/uL — ABNORMAL LOW (ref 3.87–5.11)
RDW: 15.6 % — ABNORMAL HIGH (ref 11.5–15.5)
RDW: 16 % — ABNORMAL HIGH (ref 11.5–15.5)
WBC: 6.3 10*3/uL (ref 4.0–10.5)
WBC: 5.5 10*3/uL (ref 4.0–10.5)
nRBC: 1.7 % — ABNORMAL HIGH (ref 0.0–0.2)
nRBC: 1.8 % — ABNORMAL HIGH (ref 0.0–0.2)

## 2019-01-27 LAB — BRAIN NATRIURETIC PEPTIDE: B Natriuretic Peptide: >4500 pg/mL — ABNORMAL HIGH (ref 0.0–100.0)

## 2019-01-27 LAB — BASIC METABOLIC PANEL
Anion gap: 16 — ABNORMAL HIGH (ref 5–15)
BUN: 126 mg/dL — ABNORMAL HIGH (ref 8–23)
CO2: 28 mmol/L (ref 22–32)
Calcium: 9.4 mg/dL (ref 8.9–10.3)
Chloride: 94 mmol/L — ABNORMAL LOW (ref 98–111)
Creatinine, Ser: 4.5 mg/dL — ABNORMAL HIGH (ref 0.44–1.00)
GFR calc Af Amer: 10 mL/min — ABNORMAL LOW (ref >60)
GFR calc non Af Amer: 9 mL/min — ABNORMAL LOW (ref >60)
Glucose, Bld: 122 mg/dL — ABNORMAL HIGH (ref 70–99)
Potassium: 3.7 mmol/L (ref 3.5–5.1)
Sodium: 138 mmol/L (ref 135–145)

## 2019-01-27 LAB — TSH: TSH: 5.138 u[IU]/mL — ABNORMAL HIGH (ref 0.350–4.500)

## 2019-01-27 LAB — CREATININE, SERUM
Creatinine, Ser: 4.36 mg/dL — ABNORMAL HIGH (ref 0.44–1.00)
GFR calc Af Amer: 11 mL/min — ABNORMAL LOW (ref >60)
GFR calc non Af Amer: 9 mL/min — ABNORMAL LOW (ref >60)

## 2019-01-27 LAB — TROPONIN I (HIGH SENSITIVITY): Troponin I (High Sensitivity): 61 ng/L — ABNORMAL HIGH (ref <18)

## 2019-01-27 LAB — PROTIME-INR
INR: 2.5 — ABNORMAL HIGH (ref 0.8–1.2)
Prothrombin Time: 26.8 seconds — ABNORMAL HIGH (ref 11.4–15.2)

## 2019-01-27 LAB — SARS CORONAVIRUS 2 (TAT 6-24 HRS): SARS Coronavirus 2: NEGATIVE

## 2019-01-27 LAB — MAGNESIUM: Magnesium: 2.6 mg/dL — ABNORMAL HIGH (ref 1.7–2.4)

## 2019-01-27 LAB — PHOSPHORUS: Phosphorus: 5 mg/dL — ABNORMAL HIGH (ref 2.5–4.6)

## 2019-01-27 MED ORDER — ONDANSETRON HCL 4 MG/2ML IJ SOLN: 4.0000 mg | Freq: Four times a day (QID) | INTRAMUSCULAR | Status: DC | PRN

## 2019-01-27 MED ORDER — FERROUS SULFATE 325 (65 FE) MG PO TABS
325.0000 mg | ORAL_TABLET | Freq: Every day | ORAL | Status: DC
  Administered 2019-01-28 – 2019-02-06 (×9): 325 mg via ORAL
  Filled 2019-01-27 (×10): qty 1

## 2019-01-27 MED ORDER — AMIODARONE HCL 200 MG PO TABS
200.0000 mg | ORAL_TABLET | Freq: Every day | ORAL | Status: DC
  Administered 2019-01-28 – 2019-02-06 (×10): 200 mg via ORAL
  Filled 2019-01-27 (×9): qty 1

## 2019-01-27 MED ORDER — ACETAMINOPHEN 650 MG RE SUPP: 650.0000 mg | Freq: Four times a day (QID) | RECTAL | Status: DC | PRN

## 2019-01-27 MED ORDER — FUROSEMIDE 10 MG/ML IJ SOLN: 40.0000 mg | Freq: Two times a day (BID) | INTRAMUSCULAR | Status: DC

## 2019-01-27 MED ORDER — LEVOTHYROXINE SODIUM 75 MCG PO TABS
75.0000 ug | ORAL_TABLET | Freq: Every day | ORAL | Status: DC
  Administered 2019-01-28 – 2019-02-06 (×9): 75 ug via ORAL
  Filled 2019-01-27 (×9): qty 1

## 2019-01-27 MED ORDER — ACETAMINOPHEN 325 MG PO TABS
650.0000 mg | ORAL_TABLET | Freq: Four times a day (QID) | ORAL | Status: DC | PRN
  Administered 2019-01-27 – 2019-02-05 (×12): 650 mg via ORAL
  Filled 2019-01-27 (×12): qty 2

## 2019-01-27 MED ORDER — SODIUM CHLORIDE 0.9% FLUSH: 3.0000 mL | Freq: Once | INTRAVENOUS | Status: DC

## 2019-01-27 MED ORDER — CARVEDILOL 12.5 MG PO TABS
12.5000 mg | ORAL_TABLET | Freq: Two times a day (BID) | ORAL | Status: DC
  Administered 2019-01-27 – 2019-02-06 (×19): 12.5 mg via ORAL
  Filled 2019-01-27 (×19): qty 1

## 2019-01-27 MED ORDER — FUROSEMIDE 10 MG/ML IJ SOLN
120.0000 mg | Freq: Once | INTRAVENOUS | Status: AC
  Administered 2019-01-27: 120 mg via INTRAVENOUS
  Filled 2019-01-27: qty 10

## 2019-01-27 MED ORDER — APIXABAN 5 MG PO TABS
5.0000 mg | ORAL_TABLET | Freq: Two times a day (BID) | ORAL | Status: DC
  Administered 2019-01-27 – 2019-01-28 (×2): 5 mg via ORAL
  Filled 2019-01-27 (×2): qty 1

## 2019-01-27 MED ORDER — FUROSEMIDE 10 MG/ML IJ SOLN
160.0000 mg | Freq: Two times a day (BID) | INTRAVENOUS | Status: DC
  Administered 2019-01-28: 160 mg via INTRAVENOUS
  Filled 2019-01-27: qty 16
  Filled 2019-01-27: qty 10

## 2019-01-27 MED ORDER — ONDANSETRON HCL 4 MG PO TABS: 4.0000 mg | ORAL_TABLET | Freq: Four times a day (QID) | ORAL | Status: DC | PRN

## 2019-01-27 MED ORDER — HEPARIN SODIUM (PORCINE) 5000 UNIT/ML IJ SOLN: 5000.0000 [IU] | Freq: Three times a day (TID) | INTRAMUSCULAR | Status: DC

## 2019-01-27 MED ORDER — FUROSEMIDE 10 MG/ML IJ SOLN
40.0000 mg | Freq: Once | INTRAMUSCULAR | Status: AC
  Administered 2019-01-27: 15:00:00 40 mg via INTRAVENOUS
  Filled 2019-01-27: qty 4

## 2019-01-27 NOTE — Consult Note (Deleted)
Duplicate entered in error. See consult note

## 2019-01-27 NOTE — Consult Note (Signed)
Reason for Consult: AKI/CKD stage 5 Referring Physician: Doristine Bosworth, MD  Gina [name] is an 78 y.o. female.  HPI: Gina [name] has an extensive PMH significant for atrial fibrillation s/p AV nodal ablation with [facility] PPM on eliquis, HTN, OSA on CPAP, chronic CHF due to nonischemic CMP (EF 20%), LBBB, and CKD stage 4-5 who presented with 3 week history of worsening SOB, DOE, 20 lbs weight gain, and orthopnea.  She also has a recent diagnosis of hypothyroidism with recent increase in replacement therapy.  She has had increasing doses of torsemide per heart failure clinic prior to admission, however given her progressive symptoms she was sent to the ED.  We were consulted to help further evaluate and manage her AKI/CKD vs progressive CKD.  She is followed by Nephrology in [place], [place] and was referred for access placement due to her progressive CKD.  She has not seen the vascular surgeons yet but was aware of her worsening renal function.   She does have some nausea, anorexia, and fatigue but denies dysgeusia, increased somnolence, or myoclonic jerking.      Trend in Creatinine: Creatinine, Ser  Date/Time Value Ref Range Status  01/27/2019 04:52 PM 4.36 (H) 0.44 - 1.00 mg/dL Final  01/27/2019 12:33 PM 4.50 (H) 0.44 - 1.00 mg/dL Final  12/25/2018 11:34 AM 3.60 (H) 0.57 - 1.00 mg/dL Final  11/06/2018 03:02 PM 4.38 (H) 0.44 - 1.00 mg/dL Final  08/31/2018 02:50 PM 3.87 (H) 0.44 - 1.00 mg/dL Final  05/13/2018 12:41 PM 2.69 (H) 0.44 - 1.00 mg/dL Final  03/05/2018 12:31 PM 2.45 (H) 0.44 - 1.00 mg/dL Final  12/04/2017 12:47 PM 2.31 (H) 0.44 - 1.00 mg/dL Final  10/16/2017 10:33 AM 3.11 (H) 0.44 - 1.00 mg/dL Final  10/01/2017 10:20 AM 3.47 (H) 0.44 - 1.00 mg/dL Final  09/23/2017 05:14 AM 2.31 (H) 0.44 - 1.00 mg/dL Final  09/22/2017 04:03 AM 2.44 (H) 0.44 - 1.00 mg/dL Final  09/21/2017 03:52 AM 2.38 (H) 0.44 - 1.00 mg/dL Final  09/20/2017 04:40 AM 2.45 (H) 0.44 - 1.00 mg/dL Final  09/19/2017 04:00 AM  2.51 (H) 0.44 - 1.00 mg/dL Final  09/18/2017 03:50 AM 2.35 (H) 0.44 - 1.00 mg/dL Final  09/17/2017 05:00 AM 2.10 (H) 0.44 - 1.00 mg/dL Final  09/16/2017 09:04 PM 2.23 (H) 0.44 - 1.00 mg/dL Final  09/16/2017 02:00 AM 2.12 (H) 0.44 - 1.00 mg/dL Final  09/15/2017 04:22 AM 2.25 (H) 0.44 - 1.00 mg/dL Final  09/14/2017 04:00 AM 2.44 (H) 0.44 - 1.00 mg/dL Final  09/13/2017 03:56 AM 2.75 (H) 0.44 - 1.00 mg/dL Final  09/12/2017 04:53 AM 2.77 (H) 0.44 - 1.00 mg/dL Final  09/11/2017 04:20 AM 2.90 (H) 0.44 - 1.00 mg/dL Final  09/10/2017 05:06 AM 2.56 (H) 0.44 - 1.00 mg/dL Final  09/10/2017 05:06 AM 2.60 (H) 0.44 - 1.00 mg/dL Final  09/09/2017 06:16 AM 2.19 (H) 0.44 - 1.00 mg/dL Final  09/08/2017 05:29 AM 2.13 (H) 0.44 - 1.00 mg/dL Final  09/07/2017 06:33 AM 2.25 (H) 0.44 - 1.00 mg/dL Final  09/06/2017 06:06 AM 2.51 (H) 0.44 - 1.00 mg/dL Final  09/05/2017 04:25 AM 2.80 (H) 0.44 - 1.00 mg/dL Final  09/04/2017 06:12 AM 2.63 (H) 0.44 - 1.00 mg/dL Final  09/03/2017 04:11 AM 2.58 (H) 0.44 - 1.00 mg/dL Final  09/02/2017 03:12 PM 2.36 (H) 0.44 - 1.00 mg/dL Final  08/14/2015 10:36 AM 1.60 (H) 0.44 - 1.00 mg/dL Final  02/23/2008 04:09 AM 1.09 0.4 - 1.2 mg/dL Final  02/22/2008 04:05  AM 0.87 0.4 - 1.2 mg/dL Final  02/21/2008 05:20 AM 0.92 0.4 - 1.2 mg/dL Final  02/20/2008 04:00 AM 1.11 0.4 - 1.2 mg/dL Final  02/19/2008 03:25 AM 1.57 (H) 0.4 - 1.2 mg/dL Final  02/18/2008 03:30 PM 1.60 (H) 0.4 - 1.2 mg/dL Final  02/18/2008 04:30 AM 1.47 (H) 0.4 - 1.2 mg/dL Final  02/17/2008 03:39 PM 1.14 0.4 - 1.2 mg/dL Final  02/17/2008 04:10 AM 1.06 0.4 - 1.2 mg/dL Final  02/16/2008 03:00 PM 0.74 0.4 - 1.2 mg/dL Final    PMH:   Past Medical History:  Diagnosis Date  . Acute renal failure [facility])     hospitalized... December 08, 2009... improved with hydration in the hospital  . Anemia    further workup needed... October, 2011.  [name] Aortic insufficiency    mild...echo... June 2 010  . Arthritis    Hips/Knees, severe,  requiring a walker  . Asymmetric septal hypertrophy (HCC)    echo....04/2009....patient encouraged the family to be screened elsewhere  . Atrial fibrillation ([place])     ??? atrial fibrillation during hospitalization October, 2011 ???..  . Bradycardia   . CAD (coronary artery disease)    Mild coronary disease, catheterization, 2009  . Cardiomyopathy, nonischemic ([place])    EF improved October, 2011  . CHF (congestive heart failure) ([place])   . Ejection fraction    EF 25%...nondiagnostic MRI December 2009...  /  echo June, 2010  /   echo... December 08, 2009.... ejection fraction improved... EF 60% /  EF 30%... echo.... Cut Off... December 28, 2009 with CHFPLanned ICD / CRT... patient has seen Dr.Klein..EF improved October, 2011   . Groin hematoma    Right-hematoma/abscess..repaired.. December, 2009  . Hypertension   . LBBB (left bundle branch block)   . LVH (left ventricular hypertrophy)    moderately severe... echo.. June, 2010  /  EF improved October, 2011.... cancel plans for ICD  . Mitral regurgitation    mild/ moderate...echo June, 2010  /   echo.. October, 2011.... mitral regurgitation  improved  . OSA (obstructive sleep apnea)   . Presence of permanent cardiac pacemaker   . Renal artery stenosis (HCC)    80% upper left   . Systolic heart failure    chronic    PSH:   Past Surgical History:  Procedure Laterality Date  . ABDOMINAL HYSTERECTOMY    . AV NODE ABLATION N/A 09/19/2017   Procedure: AV NODE ABLATION;  Surgeon: Deboraha Sprang, MD;  Location: [place] [facility];  Service: Cardiovascular;  Laterality: N/A;  . BIV PACEMAKER INSERTION CRT-P N/A 09/19/2017   Procedure: BIV PACEMAKER INSERTION CRT-P;  Surgeon: Deboraha Sprang, MD;  Location: [place] [facility];  Service: Cardiovascular;  Laterality: N/A;  . BIV PACEMAKER INSERTION CRT-P N/A 12/28/2018   Procedure: Upgrade to BIV PACEMAKER INSERTION CRT-P;  Surgeon: Evans Lance, MD;  Location: [place] [facility];  Service:  Cardiovascular;  Laterality: N/A;  . CARDIAC CATHETERIZATION N/A 08/14/2015   Procedure: Right/Left Heart Cath and Coronary Angiography;  Surgeon: Leonie Man, MD;  Location: [place] [facility];  Service: Cardiovascular;  Laterality: N/A;  . CARDIOVERSION N/A 09/11/2017   Procedure: CARDIOVERSION;  Surgeon: Larey Dresser, MD;  Location: [facility] ENDOSCOPY;  Service: Cardiovascular;  Laterality: N/A;  . CARDIOVERSION N/A 09/14/2017   Procedure: CARDIOVERSION;  Surgeon: Larey Dresser, MD;  Location: [place];  Service: Cardiovascular;  Laterality: N/A;  . CARDIOVERSION N/A 09/18/2017   Procedure: CARDIOVERSION FOR ATRIAL  FIBRILLATION;  Surgeon: Larey Dresser, MD;  Location: [facility] ENDOSCOPY;  Service: Cardiovascular;  Laterality: N/A;  . REPLACEMENT TOTAL HIP W/  RESURFACING IMPLANTS    . REPLACEMENT TOTAL KNEE    . Surgical Repair of a Catheteriztion associated femoral artery injury      Allergies:  Allergies  Allergen Reactions  . Bactrim [Sulfamethoxazole-Trimethoprim] Other (See Comments)    Flu like symptoms  . Cephalexin Rash    REACTION: Rash to arms/legs    Medications:   Prior to Admission medications   Medication Sig Start Date End Date Taking? Authorizing Provider  acetaminophen (TYLENOL) 325 MG tablet Take 2 tablets (650 mg total) by mouth every 4 (four) hours as needed for headache or mild pain. Patient taking differently: Take 650 mg by mouth at bedtime as needed (sleep).  09/23/17  Yes Shirley Friar, PA-C  amiodarone (PACERONE) 200 MG tablet TAKE 1 TABLET(200 MG) BY MOUTH DAILY Patient taking differently: Take 200 mg by mouth daily.  12/28/18  Yes Larey Dresser, MD  carvedilol (COREG) 12.5 MG tablet Take 1 tablet (12.5 mg total) by mouth 2 (two) times daily. 12/21/18 12/21/19 Yes McLean, Elby Showers, MD  ELIQUIS 5 MG TABS tablet TAKE 1 TABLET(5 MG) BY MOUTH TWICE DAILY Patient taking differently: Take 5 mg by mouth 2 (two) times daily.  11/10/18  Yes Larey Dresser,  MD  ferrous sulfate 325 (65 FE) MG tablet Take 325 mg by mouth daily with breakfast.   Yes [provider]  levothyroxine (SYNTHROID) 75 MCG tablet Take 75 mcg by mouth daily before breakfast.   Yes [provider]  metolazone (ZAROXOLYN) 2.5 MG tablet TAKE 1 TABLET BY MOUTH ONCE WEEKLY ON WEDNESDAY MORNINGS Patient taking differently: Take 2.5 mg by mouth once a week. Wednesdays 12/21/18  Yes Larey Dresser, MD  Multiple Vitamin (MULTIVITAMIN) tablet Take 1 tablet by mouth daily.   Yes [provider]  Multiple Vitamins-Minerals (PRESERVISION AREDS 2+MULTI VIT PO) Take 1 capsule by mouth daily.   Yes [provider]  potassium chloride SA (K-DUR,KLOR-CON) 20 MEQ tablet Take 1 tablet (20 mEq total) by mouth daily. 05/22/18  Yes Larey Dresser, MD  torsemide (DEMADEX) 20 MG tablet Take 2 tablets (40 mg total) by mouth daily. 09/03/18  Yes Larey Dresser, MD    Inpatient medications: . [START ON 01/28/2019] amiodarone  200 mg Oral Daily  . apixaban  5 mg Oral BID  . carvedilol  12.5 mg Oral BID  . [START ON 01/28/2019] ferrous sulfate  325 mg Oral Q breakfast  . [START ON 01/28/2019] levothyroxine  75 mcg Oral QAC breakfast  . sodium chloride flush  3 mL Intravenous Once    Discontinued Meds:   Medications Discontinued During This Encounter  Medication Reason  . heparin injection 5,000 Units   . gabapentin (NEURONTIN) 100 MG capsule No longer needed (for PRN medications)  . furosemide (LASIX) injection 40 mg     Social History:  reports that she quit smoking about 33 years ago. Her smoking use included cigarettes. She started smoking about 60 years ago. She has a 24.00 pack-year smoking history. She has never used smokeless tobacco. She reports that she does not drink alcohol or use drugs.  Family History:   Family History  Problem Relation Age of Onset  . Rheumatic fever Mother        in her early 47's  . Alzheimer's disease Mother   . Stomach  cancer Father 67  died 53  . Bradycardia Brother        has pacemaker  . Heart failure Paternal Grandmother   . Heart failure Paternal Grandfather     Pertinent items are noted in HPI. Weight change:  No intake or output data in the 24 hours ending 01/27/19 1814 BP (!) 117/91 (BP Location: Right Arm)   Pulse 77   Temp 99.3 F (37.4 C) (Oral)   Resp (!) 21   Ht 5\' 4"  (1.626 m)   Wt 104.9 kg   SpO2 100%   BMI 39.70 kg/m  Vitals:   01/27/19 1430 01/27/19 1500 01/27/19 1607 01/27/19 1700  BP: (!) 134/91 120/65 (!) 117/91   Pulse: 70 77 77   Resp: (!) 22 20 (!) 21   Temp:   99.3 F (37.4 C)   TempSrc:   Oral   SpO2: 100% 99% 100%   Weight:    104.9 kg  Height:    5\' 4"  (1.626 m)     General appearance: alert, cooperative, fatigued and no distress Head: Normocephalic, without obvious abnormality, atraumatic Resp: clear to auscultation bilaterally Cardio: no rub GI: obese/distended, +BS, soft, NT Extremities: edema trace pretibial edema Neuro: no asterixis  Labs: Basic Metabolic Panel: Recent Labs  Lab 01/27/19 1233 01/27/19 1652  NA 138  --   K 3.7  --   CL 94*  --   CO2 28  --   GLUCOSE 122*  --   BUN 126*  --   CREATININE 4.50* 4.36*  ALBUMIN  --  3.5  CALCIUM 9.4  --   PHOS  --  5.0*   Liver Function Tests: Recent Labs  Lab 01/27/19 1652  AST 47*  ALT 103*  ALKPHOS 69  BILITOT 1.7*  PROT 6.4*  ALBUMIN 3.5   No results for input(s): LIPASE, AMYLASE in the last 168 hours. No results for input(s): AMMONIA in the last 168 hours. CBC: Recent Labs  Lab 01/27/19 1233 01/27/19 1652  WBC 6.3 5.5  HGB 10.5* 10.1*  HCT 33.9* 32.5*  MCV 108.0* 107.3*  PLT 253 234   PT/INR: @LABRCNTIP (inr:5) Cardiac Enzymes: )No results for input(s): CKTOTAL, CKMB, CKMBINDEX, TROPONINI in the last 168 hours. CBG: No results for input(s): GLUCAP in the last 168 hours.  Iron Studies: No results for input(s): IRON, TIBC, TRANSFERRIN, FERRITIN in the last  168 hours.  Xrays/Other Studies: Dg Chest 2 View  Result Date: 01/27/2019 CLINICAL DATA:  Shortness of breath, history of CHF. EXAM: CHEST - 2 VIEW COMPARISON:  09/20/2017 FINDINGS: Heart size is markedly enlarged as before. Pulmonary vascular congestion is noted without frank edema or signs of pleural effusion. No signs of consolidation. Left-sided dual lead pacer device in situ as before. No acute bone finding. Signs of calcified atherosclerosis. IMPRESSION: Cardiomegaly and pulmonary vascular congestion. No frank edema or pleural effusions. Electronically Signed   By: Zetta Bills M.D.   On: 01/27/2019 12:06     Assessment/Plan: 1.  AKI/CKD stage 5 vs progressive CKD due to cardiorenal syndrome-  She has some mild uremic symptoms and failed oral diuretics.  No urgent indication for dialysis, however we did discuss the potential need for dialysis if she does not respond to IV lasix.  She is amenable to proceed with HD if needed but was concerned about starting dialysis before she had an upgrade to her pacemaker (had a failed attempt to convert to biventricular pacer on 12/29/18 due to an occluded left subclavian vein).  2. Acute on  chronic CHF- agree with admission for IV diuresis.  She states that she responded to the first dose of IV lasix in the ED.   1. Given 40 mg once then another dose of 120 mg and ordered for bid IV lasix. 2. Continue to follow strict I's/O's and daily weights.  3. Anemia of CKD stage 5- will check iron stores and follow. 4. A fib with AV nodal ablation- consult cardiology and likely EP 5. Hypothyroidism- cont with replacement therapy 6. OSA- on CPAP 7. PVC's- per Cardiology   Broadus John A Mirian Casco 01/27/2019, 6:14 PM

## 2019-01-27 NOTE — ED Notes (Signed)
Placed pt on 2lpm. Pt uses oxygen at night. Pt increased from 93% to 100%

## 2019-01-27 NOTE — Progress Notes (Signed)
Pt went to ED for evaluation

## 2019-01-27 NOTE — ED Triage Notes (Signed)
Pt presents here from heart failure clinic with increased shortness of breath and left hand swelling after increasing her diuretic. She denies chest pain, v/d. Reports mild nausea.

## 2019-01-27 NOTE — ED Notes (Signed)
The patient now states she is in renal failure and will have to begin Dialysis. She went to [facility] in [place] where they discovered a blood clot in left arm. She is on Eloquis.

## 2019-01-27 NOTE — ED Notes (Signed)
Patient transported to X-ray 

## 2019-01-27 NOTE — Consult Note (Signed)
Pt went to ED for evaluation. Clinic appt canceled. See hospital consult note

## 2019-01-27 NOTE — H&P (Addendum)
History and Physical    Gina [name] [id number] DOB: 11/25/1940 DOA: 01/27/2019  PCP: Glenda Chroman, MD  Patient coming from: Heart failure clinic I have personally briefly reviewed patient's old medical records in [place]  Chief Complaint: Worsening exertional shortness of breath since 3 weeks  HPI: Gina [name] is a 78 y.o. female with medical history significant of chronic systolic congestive heart failure due to nonischemic cardiomyopathy with ejection fraction of 20%, left bundle branch block, stage V CKD, A. fib status post AV nodal ablation with [facility] PPM in 2019-on Eliquis, anemia of chronic disease, hypertension, renal artery stenosis, obstructive sleep apnea on CPAP presents to emergency department due to worsening shortness of breath since 3 weeks.  Patient tells me that she went to heart failure clinic this morning-due to her symptoms she was brought to the emergency department for further evaluation and management.  Patient reports exertional shortness of breath even with few steps, abdominal distention and weight gain.  She has been compliant with her diuretics and low-sodium diet.  No history of smoking, alcohol, illicit drug use.  She reports 20 pound weight gain, water retention mostly in the abdomen.  She has stage V kidney disease and she is nearing hemodialysis.  She is awaiting consultation with vascular surgeon for placement of HD access.  She reports that she still makes urine.  Recently diagnosed with hypothyroidism and takes levothyroxine as prescribed.  She has obstructive sleep apnea and she uses CPAP and 2 L of oxygen at bedtime only.  She denies chest pain, palpitation, leg swelling, orthopnea, PND, nausea, vomiting, abdominal pain, diarrhea, headache, blurry vision, lightheadedness or dizziness.  ED Course: Upon arrival: Patient respiratory rate in 20s, on 2 L of oxygen via nasal cannula, chest x-ray shows cardiomegaly with pulmonary vascular  congestion, proBNP elevated more than 4500, troponin elevated at 61, COVID-19 pending.  EDP consulted cardiology for further evaluation and management.  Review of Systems: As per HPI otherwise negative.    Past Medical History:  Diagnosis Date  . Acute renal failure [facility])     hospitalized... December 08, 2009... improved with hydration in the hospital  . Anemia    further workup needed... October, 2011.  [name] Aortic insufficiency    mild...echo... June 2 010  . Arthritis    Hips/Knees, severe, requiring a walker  . Asymmetric septal hypertrophy (HCC)    echo....04/2009....patient encouraged the family to be screened elsewhere  . Atrial fibrillation ([place])     ??? atrial fibrillation during hospitalization October, 2011 ???..  . Bradycardia   . CAD (coronary artery disease)    Mild coronary disease, catheterization, 2009  . Cardiomyopathy, nonischemic ([place])    EF improved October, 2011  . Ejection fraction    EF 25%...nondiagnostic MRI December 2009...  /  echo June, 2010  /   echo... December 08, 2009.... ejection fraction improved... EF 60% /  EF 30%... echo.... [place]... December 28, 2009 with CHFPLanned ICD / CRT... patient has seen Dr.Klein..EF improved October, 2011   . Groin hematoma    Right-hematoma/abscess..repaired.. December, 2009  . Hypertension   . LBBB (left bundle branch block)   . LVH (left ventricular hypertrophy)    moderately severe... echo.. June, 2010  /  EF improved October, 2011.... cancel plans for ICD  . Mitral regurgitation    mild/ moderate...echo June, 2010  /   echo.. October, 2011.... mitral regurgitation  improved  . OSA (obstructive sleep apnea)   . Renal  artery stenosis (HCC)    80% upper left   . Systolic heart failure    chronic    Past Surgical History:  Procedure Laterality Date  . ABDOMINAL HYSTERECTOMY    . AV NODE ABLATION N/A 09/19/2017   Procedure: AV NODE ABLATION;  Surgeon: Deboraha Sprang, MD;  Location: [place] [facility];   Service: Cardiovascular;  Laterality: N/A;  . BIV PACEMAKER INSERTION CRT-P N/A 09/19/2017   Procedure: BIV PACEMAKER INSERTION CRT-P;  Surgeon: Deboraha Sprang, MD;  Location: [place] [facility];  Service: Cardiovascular;  Laterality: N/A;  . BIV PACEMAKER INSERTION CRT-P N/A 12/28/2018   Procedure: Upgrade to BIV PACEMAKER INSERTION CRT-P;  Surgeon: Evans Lance, MD;  Location: [place] [facility];  Service: Cardiovascular;  Laterality: N/A;  . CARDIAC CATHETERIZATION N/A 08/14/2015   Procedure: Right/Left Heart Cath and Coronary Angiography;  Surgeon: Leonie Man, MD;  Location: [place] [facility];  Service: Cardiovascular;  Laterality: N/A;  . CARDIOVERSION N/A 09/11/2017   Procedure: CARDIOVERSION;  Surgeon: Larey Dresser, MD;  Location: [facility] ENDOSCOPY;  Service: Cardiovascular;  Laterality: N/A;  . CARDIOVERSION N/A 09/14/2017   Procedure: CARDIOVERSION;  Surgeon: Larey Dresser, MD;  Location: [place];  Service: Cardiovascular;  Laterality: N/A;  . CARDIOVERSION N/A 09/18/2017   Procedure: CARDIOVERSION FOR ATRIAL FIBRILLATION;  Surgeon: Larey Dresser, MD;  Location: [facility] ENDOSCOPY;  Service: Cardiovascular;  Laterality: N/A;  . REPLACEMENT TOTAL HIP W/  RESURFACING IMPLANTS    . REPLACEMENT TOTAL KNEE    . Surgical Repair of a Catheteriztion associated femoral artery injury       reports that she quit smoking about 33 years ago. Her smoking use included cigarettes. She started smoking about 60 years ago. She has a 24.00 pack-year smoking history. She has never used smokeless tobacco. She reports that she does not drink alcohol. No history on file for drug.  Allergies  Allergen Reactions  . Bactrim [Sulfamethoxazole-Trimethoprim] Other (See Comments)    Flu like symptoms  . Cephalexin Rash    REACTION: Rash to arms/legs    Family History  Problem Relation Age of Onset  . Rheumatic fever Mother        in her early 58's  . Alzheimer's disease Mother   . Stomach cancer Father  25       died 6  . Bradycardia Brother        has pacemaker  . Heart failure Paternal Grandmother   . Heart failure Paternal Grandfather     Prior to Admission medications   Medication Sig Start Date End Date Taking? Authorizing Provider  acetaminophen (TYLENOL) 325 MG tablet Take 2 tablets (650 mg total) by mouth every 4 (four) hours as needed for headache or mild pain. 09/23/17   Shirley Friar, PA-C  amiodarone (PACERONE) 200 MG tablet TAKE 1 TABLET(200 MG) BY MOUTH DAILY 12/28/18   Larey Dresser, MD  carvedilol (COREG) 12.5 MG tablet Take 1 tablet (12.5 mg total) by mouth 2 (two) times daily. 12/21/18 12/21/19  Larey Dresser, MD  ELIQUIS 5 MG TABS tablet TAKE 1 TABLET(5 MG) BY MOUTH TWICE DAILY Patient taking differently: Take 5 mg by mouth 2 (two) times daily.  11/10/18   Larey Dresser, MD  ferrous sulfate 325 (65 FE) MG tablet Take 325 mg by mouth daily with breakfast.    [provider]  gabapentin (NEURONTIN) 100 MG capsule Take 100 mg by mouth 2 (two) times daily as  needed (nerve pain).    [provider]  levothyroxine (SYNTHROID) 75 MCG tablet Take 75 mcg by mouth daily before breakfast.    [provider]  metolazone (ZAROXOLYN) 2.5 MG tablet TAKE 1 TABLET BY MOUTH ONCE WEEKLY ON WEDNESDAY MORNINGS Patient taking differently: Take 2.5 mg by mouth once a week. Wednesdays 12/21/18   Larey Dresser, MD  Multiple Vitamin (MULTIVITAMIN) tablet Take 1 tablet by mouth daily.    [provider]  Multiple Vitamins-Minerals (PRESERVISION AREDS 2+MULTI VIT PO) Take 1 capsule by mouth daily.    [provider]  potassium chloride SA (K-DUR,KLOR-CON) 20 MEQ tablet Take 1 tablet (20 mEq total) by mouth daily. 05/22/18   Larey Dresser, MD  torsemide (DEMADEX) 20 MG tablet Take 2 tablets (40 mg total) by mouth daily. 09/03/18   Larey Dresser, MD    Physical Exam: Vitals:   01/27/19 1215 01/27/19 1229 01/27/19 1230 01/27/19  1330  BP: 112/77  122/76 117/71  Pulse: 72  76 69  Resp: (!) 23  (!) 24 (!) 21  Temp:      TempSrc:      SpO2: 96%  100% 100%  Weight:  96.6 kg    Height:  5\' 4"  (1.626 m)      Constitutional: In mild respiratory distress, on 2 L of oxygen via nasal cannula Eyes: PERRL, lids and conjunctivae normal ENMT: Mucous membranes are moist. Posterior pharynx clear of any exudate or lesions.Normal dentition.  Neck: normal, supple, no masses, no thyromegaly Respiratory: clear to auscultation bilaterally, no wheezing, no crackles. Normal respiratory effort. No accessory muscle use.  Cardiovascular: Regular rate and rhythm, no murmurs / rubs / gallops. No extremity edema. 2+ pedal pulses. No carotid bruits.  Abdomen: Distended, no tenderness, no masses palpated. No hepatosplenomegaly. Bowel sounds positive.  Musculoskeletal: no clubbing / cyanosis. No joint deformity upper and lower extremities. Good ROM, no contractures. Normal muscle tone.  Skin: no rashes, lesions, ulcers. No induration Neurologic: CN 2-12 grossly intact. Sensation intact, DTR normal. Strength 5/5 in all 4.  Psychiatric: Normal judgment and insight. Alert and oriented x 3. Normal mood.    Labs on Admission: I have personally reviewed following labs and imaging studies  CBC: Recent Labs  Lab 01/27/19 1233  WBC 6.3  HGB 10.5*  HCT 33.9*  MCV 108.0*  PLT 123456   Basic Metabolic Panel: Recent Labs  Lab 01/27/19 1233  NA 138  K 3.7  CL 94*  CO2 28  GLUCOSE 122*  BUN 126*  CREATININE 4.50*  CALCIUM 9.4   GFR: Estimated Creatinine Clearance: 11.6 mL/min (A) (by C-G formula based on SCr of 4.5 mg/dL (H)). Liver Function Tests: No results for input(s): AST, ALT, ALKPHOS, BILITOT, PROT, ALBUMIN in the last 168 hours. No results for input(s): LIPASE, AMYLASE in the last 168 hours. No results for input(s): AMMONIA in the last 168 hours. Coagulation Profile: Recent Labs  Lab 01/27/19 1233  INR 2.5*   Cardiac  Enzymes: No results for input(s): CKTOTAL, CKMB, CKMBINDEX, TROPONINI in the last 168 hours. BNP (last 3 results) No results for input(s): PROBNP in the last 8760 hours. HbA1C: No results for input(s): HGBA1C in the last 72 hours. CBG: No results for input(s): GLUCAP in the last 168 hours. Lipid Profile: No results for input(s): CHOL, HDL, LDLCALC, TRIG, CHOLHDL, LDLDIRECT in the last 72 hours. Thyroid Function Tests: No results for input(s): TSH, T4TOTAL, FREET4, T3FREE, THYROIDAB in the last 72 hours. Anemia Panel:  No results for input(s): VITAMINB12, FOLATE, FERRITIN, TIBC, IRON, RETICCTPCT in the last 72 hours. Urine analysis:    Component Value Date/Time   COLORURINE AMBER (A) 09/10/2017 1602   APPEARANCEUR CLOUDY (A) 09/10/2017 1602   LABSPEC 1.014 09/10/2017 1602   PHURINE 6.0 09/10/2017 1602   GLUCOSEU NEGATIVE 09/10/2017 1602   HGBUR LARGE (A) 09/10/2017 1602   BILIRUBINUR NEGATIVE 09/10/2017 [place] 09/10/2017 1602   PROTEINUR 30 (A) 09/10/2017 1602   NITRITE NEGATIVE 09/10/2017 1602   LEUKOCYTESUR LARGE (A) 09/10/2017 1602    Radiological Exams on Admission: Dg Chest 2 View  Result Date: 01/27/2019 CLINICAL DATA:  Shortness of breath, history of CHF. EXAM: CHEST - 2 VIEW COMPARISON:  09/20/2017 FINDINGS: Heart size is markedly enlarged as before. Pulmonary vascular congestion is noted without frank edema or signs of pleural effusion. No signs of consolidation. Left-sided dual lead pacer device in situ as before. No acute bone finding. Signs of calcified atherosclerosis. IMPRESSION: Cardiomegaly and pulmonary vascular congestion. No frank edema or pleural effusions. Electronically Signed   By: Zetta Bills M.D.   On: 01/27/2019 12:06    EKG: Paced rhythm with premature ventricular contractions.  Assessment/Plan Principal Problem:   Acute on chronic congestive heart failure (HCC) Active Problems:   Hypertension   Mitral regurgitation   Anemia  of chronic disease   OSA (obstructive sleep apnea)   Atrial fibrillation with RVR (HCC)   Pacemaker   Hypothyroidism   CKD (chronic kidney disease) stage 5, GFR less than 15 ml/min (HCC)   PVC (premature ventricular contraction)   Acute on chronic congestive heart failure: - Chest x-ray shows cardiomegaly with pulmonary vascular congestion, BNP elevated more than 4500, initial troponin elevated at 61-patient denies ACS symptoms.  EKG: No acute changes.   -We will admit patient at stepdown unit for close monitoring.  On telemetry.  Will trend troponin. -Strict INO's and daily weight, started on Lasix IV 40 mg IV twice daily -Check magnesium level.  Monitor electrolytes closely. -EDP consulted cardiology-await recommendations.  Stage V chronic kidney disease stage end-stage renal disease: -Potassium: WNL.  BUN: 126, creatinine: 4.50 elevated from 3.60 in 1 month, GFR 9 trended down from eleven in 1 month.  Patient was recently evaluated by vascular surgery for HD access. -Consulted nephrology Dr. Merrilee Seashore CKD stage V-ESRD  A. fib with [id number] -She is status post AV nodal ablation with [facility] Jude PPM. -We will continue Eliquis 5 mg twice a day.  On telemetry.  Monitor heart rate closely.  Anemia of chronic disease: -Likely from chronic kidney disease. -H&H is stable, no signs of bleeding.  Monitor H&H. -Continue ferrous sulfate  Hypothyroidism: Check TSH -Continue levothyroxine  PVCs: On amiodarone at home -We will continue same.  Obstructive sleep apnea: -On CPAP at home, will continue same.   DVT prophylaxis: TED/SCD/eliquis Code Status: Full code Family Communication: Patient's niece-who is also a caregiver present at bedside.  Plan of care discussed with patient and her niece in length and they verbalized understanding and agreed with it. Disposition Plan: TBD Consults called: Cardiology by EDP and nephrology Dr. Royce Macadamia  admission status: Inpatient at stepdown unit    Mckinley Jewel MD [facility] Pager [phone]  If 7PM-7AM, please contact night-coverage [email or web address] Password [facility]  01/27/2019, 3:38 PM

## 2019-01-27 NOTE — ED Provider Notes (Addendum)
[facility] EMERGENCY DEPARTMENT Provider Note   CSN: [id number] Arrival date & time: 01/27/19  1116     History   Chief Complaint Chief Complaint  Patient presents with   Congestive Heart Failure   Shortness of Breath    HPI Gina [name] is a 78 y.o. female.     HPI Patient presents to the emergency room for evaluation of shortness of breath.  Patient has a history of chronic congestive heart failure, atrial fibrillation, chronic kidney disease as well as other medical problems.  She has noticed over the last couple of weeks she has had progressive shortness of breath.  Patient was hoping the symptoms would get better so she had not seen anyone for evaluation.  Today she had a scheduled CHF clinic appointment.  Patient states that she was running a little late and they evaluated the patient.  They noted that she was having significant shortness of breath so she was instructed to come to the ED.  Patient denies any fevers or chills.  No leg swelling.  No chest pain. Past Medical History:  Diagnosis Date   Acute renal failure ([place])     hospitalized... December 08, 2009... improved with hydration in the hospital   Anemia    further workup needed... October, 2011.   Aortic insufficiency    mild...echo... June 2 010   Arthritis    Hips/Knees, severe, requiring a walker   Asymmetric septal hypertrophy (HCC)    echo....04/2009....patient encouraged the family to be screened elsewhere   Atrial fibrillation [facility])     ??? atrial fibrillation during hospitalization October, 2011 ???..   Bradycardia    CAD (coronary artery disease)    Mild coronary disease, catheterization, 2009   Cardiomyopathy, nonischemic ([place])    EF improved October, 2011   Ejection fraction    EF 25%...nondiagnostic MRI December 2009...  /  echo June, 2010  /   echo... December 08, 2009.... ejection fraction improved... EF 60% /  EF 30%... echo.... [place]... December 28, 2009 with  CHFPLanned ICD / CRT... patient has seen Dr.Klein..EF improved October, 2011    Groin hematoma    Right-hematoma/abscess..repaired.. December, 2009   Hypertension    LBBB (left bundle branch block)    LVH (left ventricular hypertrophy)    moderately severe... echo.. June, 2010  /  EF improved October, 2011.... cancel plans for ICD   Mitral regurgitation    mild/ moderate...echo June, 2010  /   echo.. October, 2011.... mitral regurgitation  improved   OSA (obstructive sleep apnea)    Renal artery stenosis (HCC)    80% upper left    Systolic heart failure    chronic    Patient Active Problem List   Diagnosis Date Noted   Pacemaker    SOB (shortness of breath)    UTI due to extended-spectrum beta lactamase (ESBL) producing Escherichia coli    Acute blood loss anemia    Acute respiratory failure with hypoxia (HCC)    Atrial fibrillation with RVR (HCC)    Pleural effusion, right    Acute on chronic systolic heart failure (HCC)    Severe left ventricular systolic dysfunction    Chronic kidney disease (CKD), stage III (moderate)    CHF exacerbation ([place]) 09/02/2017   AKI (acute kidney injury) ([place])    Overweight(278.02) 07/29/2013   Chronic combined systolic and diastolic CHF (congestive heart failure) ([place]) 12/18/2012   Coronary artery disease, non-occlusive    Ejection fraction  Cardiomyopathy, nonischemic (HCC)    Asymmetric septal hypertrophy (HCC)    LVH (left ventricular hypertrophy)    Hypertension    Mitral regurgitation    Renal artery stenosis (HCC)    Anemia    LBBB (left bundle branch block)    OSA (obstructive sleep apnea)    Bradycardia    Atrial fibrillation ([place])    HYPOKALEMIA 02/14/2010   Aortic regurgitation 11/21/2008    Past Surgical History:  Procedure Laterality Date   ABDOMINAL HYSTERECTOMY     AV NODE ABLATION N/A 09/19/2017   Procedure: AV NODE ABLATION;  Surgeon: Deboraha Sprang, MD;  Location: [place] [facility];  Service: Cardiovascular;  Laterality: N/A;   BIV PACEMAKER INSERTION CRT-P N/A 09/19/2017   Procedure: BIV PACEMAKER INSERTION CRT-P;  Surgeon: Deboraha Sprang, MD;  Location: [place] [facility];  Service: Cardiovascular;  Laterality: N/A;   BIV PACEMAKER INSERTION CRT-P N/A 12/28/2018   Procedure: Upgrade to [place] CRT-P;  Surgeon: Evans Lance, MD;  Location: [place] [facility];  Service: Cardiovascular;  Laterality: N/A;   CARDIAC CATHETERIZATION N/A 08/14/2015   Procedure: Right/Left Heart Cath and Coronary Angiography;  Surgeon: Leonie Man, MD;  Location: [place] [facility];  Service: Cardiovascular;  Laterality: N/A;   CARDIOVERSION N/A 09/11/2017   Procedure: CARDIOVERSION;  Surgeon: Larey Dresser, MD;  Location: [facility] ENDOSCOPY;  Service: Cardiovascular;  Laterality: N/A;   CARDIOVERSION N/A 09/14/2017   Procedure: CARDIOVERSION;  Surgeon: Larey Dresser, MD;  Location: [place];  Service: Cardiovascular;  Laterality: N/A;   CARDIOVERSION N/A 09/18/2017   Procedure: CARDIOVERSION FOR ATRIAL FIBRILLATION;  Surgeon: Larey Dresser, MD;  Location: [facility] ENDOSCOPY;  Service: Cardiovascular;  Laterality: N/A;   REPLACEMENT TOTAL HIP W/  RESURFACING IMPLANTS     REPLACEMENT TOTAL KNEE     Surgical Repair of a Catheteriztion associated femoral artery injury       OB History   No obstetric history on file.      Home Medications    Prior to Admission medications   Medication Sig Start Date End Date Taking? Authorizing Provider  acetaminophen (TYLENOL) 325 MG tablet Take 2 tablets (650 mg total) by mouth every 4 (four) hours as needed for headache or mild pain. 09/23/17   Shirley Friar, PA-C  amiodarone (PACERONE) 200 MG tablet TAKE 1 TABLET(200 MG) BY MOUTH DAILY 12/28/18   Larey Dresser, MD  carvedilol (COREG) 12.5 MG tablet Take 1 tablet (12.5 mg total) by mouth 2 (two) times daily. 12/21/18 12/21/19  Larey Dresser, MD    ELIQUIS 5 MG TABS tablet TAKE 1 TABLET(5 MG) BY MOUTH TWICE DAILY Patient taking differently: Take 5 mg by mouth 2 (two) times daily.  11/10/18   Larey Dresser, MD  ferrous sulfate 325 (65 FE) MG tablet Take 325 mg by mouth daily with breakfast.    [provider]  gabapentin (NEURONTIN) 100 MG capsule Take 100 mg by mouth 2 (two) times daily as needed (nerve pain).    [provider]  levothyroxine (SYNTHROID) 75 MCG tablet Take 75 mcg by mouth daily before breakfast.    [provider]  metolazone (ZAROXOLYN) 2.5 MG tablet TAKE 1 TABLET BY MOUTH ONCE WEEKLY ON WEDNESDAY MORNINGS Patient taking differently: Take 2.5 mg by mouth once a week. Wednesdays 12/21/18   Larey Dresser, MD  Multiple Vitamin (MULTIVITAMIN) tablet Take 1 tablet by mouth daily.    [provider]  Multiple Vitamins-Minerals (PRESERVISION AREDS 2+MULTI VIT PO) Take 1 capsule by mouth daily.    [provider]  potassium chloride SA (K-DUR,KLOR-CON) 20 MEQ tablet Take 1 tablet (20 mEq total) by mouth daily. 05/22/18   Larey Dresser, MD  torsemide (DEMADEX) 20 MG tablet Take 2 tablets (40 mg total) by mouth daily. 09/03/18   Larey Dresser, MD    Family History Family History  Problem Relation Age of Onset   Rheumatic fever Mother        in her early 59's   Alzheimer's disease Mother    Stomach cancer Father 43       died 06/04/1998   Bradycardia Brother        has pacemaker   Heart failure Paternal Grandmother    Heart failure Paternal Grandfather     Social History Social History   Tobacco Use   Smoking status: Former Smoker    Packs/day: 1.00    Years: 24.00    Pack years: 24.00    Types: Cigarettes    Start date: 10/29/1958    Quit date: 12/02/1985    Years since quitting: 33.1   Smokeless tobacco: Never Used  Substance Use Topics   Alcohol use: No    Alcohol/week: 0.0 standard drinks   Drug use: Not on file     Allergies   Bactrim  [sulfamethoxazole-trimethoprim] and Cephalexin   Review of Systems Review of Systems  All other systems reviewed and are negative.    Physical Exam Updated Vital Signs BP 117/71    Pulse 69    Temp 97.8 F (36.6 C) (Oral)    Resp (!) 21    Ht 1.626 m (5\' 4" )    Wt 96.6 kg    SpO2 100%    BMI 36.56 kg/m   Physical Exam Vitals signs and nursing note reviewed.  Constitutional:      General: She is not in acute distress.    Appearance: She is well-developed.  HENT:     Head: Normocephalic and atraumatic.     Right Ear: External ear normal.     Left Ear: External ear normal.  Eyes:     General: No scleral icterus.       Right eye: No discharge.        Left eye: No discharge.     Conjunctiva/sclera: Conjunctivae normal.  Neck:     Musculoskeletal: Neck supple.     Trachea: No tracheal deviation.  Cardiovascular:     Rate and Rhythm: Normal rate and regular rhythm.  Pulmonary:     Effort: Pulmonary effort is normal. Tachypnea present. No respiratory distress.     Breath sounds: Normal breath sounds. No stridor. No decreased breath sounds, wheezing or rales.  Abdominal:     General: Bowel sounds are normal. There is no distension.     Palpations: Abdomen is soft.     Tenderness: There is no abdominal tenderness. There is no guarding or rebound.  Musculoskeletal:        General: No tenderness.  Skin:    General: Skin is warm and dry.     Coloration: Skin is pale.     Findings: No rash.  Neurological:     Mental Status: She is alert.     Cranial Nerves: No cranial nerve deficit (no facial droop, extraocular movements intact, no slurred speech).     Sensory: No sensory deficit.     Motor: No abnormal muscle tone or seizure activity.  Coordination: Coordination normal.      ED Treatments / Results  Labs (all labs ordered are listed, but only abnormal results are displayed) Labs Reviewed  BASIC METABOLIC PANEL - Abnormal; Notable for the following components:       Result Value   Chloride 94 (*)    Glucose, Bld 122 (*)    BUN 126 (*)    Creatinine, Ser 4.50 (*)    GFR calc non Af Amer 9 (*)    GFR calc Af Amer 10 (*)    Anion gap 16 (*)    All other components within normal limits  CBC - Abnormal; Notable for the following components:   RBC 3.14 (*)    Hemoglobin 10.5 (*)    HCT 33.9 (*)    MCV 108.0 (*)    RDW 15.6 (*)    nRBC 1.7 (*)    All other components within normal limits  PROTIME-INR - Abnormal; Notable for the following components:   Prothrombin Time 26.8 (*)    INR 2.5 (*)    All other components within normal limits  BRAIN NATRIURETIC PEPTIDE - Abnormal; Notable for the following components:   B Natriuretic Peptide >4,500.0 (*)    All other components within normal limits  TROPONIN I (HIGH SENSITIVITY) - Abnormal; Notable for the following components:   Troponin I (High Sensitivity) 61 (*)    All other components within normal limits  SARS CORONAVIRUS 2 (TAT 6-24 HRS)    EKG EKG Interpretation  Date/Time:  Wednesday January 27 2019 11:25:24 EST Ventricular Rate:  78 PR Interval:    QRS Duration: 166 QT Interval:  578 QTC Calculation: 658 R Axis:   -88 Text Interpretation: Ventricular-paced rhythm with occasional Premature ventricular complexes Abnormal ECG No significant change since last tracing Confirmed by Dorie Rank [phone]) on 01/27/2019 12:07:19 PM   Radiology Dg Chest 2 View  Result Date: 01/27/2019 CLINICAL DATA:  Shortness of breath, history of CHF. EXAM: CHEST - 2 VIEW COMPARISON:  09/20/2017 FINDINGS: Heart size is markedly enlarged as before. Pulmonary vascular congestion is noted without frank edema or signs of pleural effusion. No signs of consolidation. Left-sided dual lead pacer device in situ as before. No acute bone finding. Signs of calcified atherosclerosis. IMPRESSION: Cardiomegaly and pulmonary vascular congestion. No frank edema or pleural effusions. Electronically Signed   By: Zetta Bills  M.D.   On: 01/27/2019 12:06    Procedures Procedures (including critical care time)  Medications Ordered in ED Medications  sodium chloride flush (NS) 0.9 % injection 3 mL (has no administration in time range)     Initial Impression / Assessment and Plan / ED Course  I have reviewed the triage vital signs and the nursing notes.  Pertinent labs & imaging results that were available during my care of the patient were reviewed by me and considered in my medical decision making (see chart for details).  Clinical Course as of Jan 26 1433  Wed Jan 27, 2019  1433 Case discussed with cardiology.  Will consult on patient    [id number] Discussed with medical service for admission   [JK]    Clinical Course User Index [JK] Dorie Rank, MD  Pt presents with worsening dyspnea.  Labs notable for persistent renal insufficiency.  Chronic anemia is also stable.  Significantly elevated BNP and slight increase in troponin.  Chest x-ray notable for cardiomegaly and vascular congestion.  Symptoms likely related to her chronic CHF.  Patient is not hypoxic  at rest but she has increased work of breathing and has a rather complex medical history.  I will consult with cardiology.  Anticipate need for mission for diuresis and monitoring.  Final Clinical Impressions(s) / ED Diagnoses   Final diagnoses:  Acute on chronic congestive heart failure, unspecified heart failure type [facility])      Dorie Rank, MD 01/27/19 1410    Dorie Rank, MD 01/27/19 1434

## 2019-01-27 NOTE — Consult Note (Addendum)
Advanced Heart Failure Team Consult Note   Primary Physician: Glenda Chroman, MD PCP-Cardiologist:  Carlyle Dolly, MD  [facility]: Dr. Aundra Dubin EP: Dr. Caryl Comes Dr. Lovena Le  Reason for Consultation: acute on chronic systolic heart failure   78 y/o female w/ chronic systolic CHF 2/2 NICM, EF 20%, mild RV dysfunction, normal LHC in 2017, LBBB, Stage 5 CKD nearing HD, Atrial Fibrillation s/p AV nodal ablation with St Jude PPM in 2019 w/ recent failed attempt at upgrade to CRT-P due to left subclavian vein occlusion blocking access, chronic anticoagulation w/ Eliquis, chronic anemia, HTN, renal artery stenosis and OSA on CPAP, admitted for acute on chronic systolic CHF w/ NYHA Class IIIb symptoms, at the request of Dr. Tomi Bamberger, Emergency Medicine.   HPI:    Gina [name] [id number] y.o.femalewith h/o chronic systolic CHF, anemia, HTN, LBBB, renal artery stenosis, and OSA.   Admitted 09/02/2017 with peripheral edema and worsening SOB. Attempts at diuresis were difficult due to AKI. Transferred to [facility] 09/10/17 in setting of cardiogenic shock 2/2 Afib RVR. She required milrinone.  She underwent DCCV 7/11 and 7/14. Discussed with EP after she went back into Afib a third time. Underwent DCCV 7/18 and stayed in for NSR most of the day, but then went back into Afib. Underwent AV nodal ablation and St Jude PPM implantation 09/19/17 with Dr. Caryl Comes. Unable to place an LV lead. She was discharged to SNF. Echo in 7/19 with EF 20%.    Zio patch in 4/20 showed atrial fibrillation with average rate 73, rare PVCs and PACs.   Echo in 6/20 showed EF 20% with diffuse hypokinesis and mild dilation, moderate MR, mildly decreased RV systolic function.   On 12/28/18, she presented for elective device upgrade to BiV pacemaker, CRT-P, by Dr. Lovena Le, however unsuccessful due to left subclavian vein occlusion blocking acess. Procedure was aborted.   She has also recently developed worsening renal failure and nearing HD. She is  awaiting consultation to meet w/ a vascular surgeon for permanent HD access. She reports that she is still making urine.  Also recently diagnosed w/ hypothyroidism in October and recently started levothyroxine   Over the last 2 weeks, pt developed progressive wt gain and worsening exertional dyspnea. Notes 20 wt gain. Water retention mostly in abdomen. Minimal LEE. She was advised to take a 1x dose of metolazone 2.5 mg and to increase her torsemide to 40 mg bid along w/ increase in supp K to 40 mEq x 2 days. Clinic appt was made for today to assess, however given worsening symptoms w/ progression to dyspnea at rest, pt went to the ED.   ED Course: CXR w/ cardiomegaly and pulmonary vascular congestion. BNP >4500 Hs Trop 61. EKG w/ V paced rhythm, w/ occasional PVCs 78 bpm. WBC ct normal 6.3. Hgb 10.5 c/w baseline. SCr 4.5 (baseline 3.6-4.3). BUN 126. K 3.7   Echo 08/31/18: EF 20%, mod LVH, diffuse hypokinesis, normal RV size and function, severe LAE, mod MR. Moderately elevated PASP, 47 mm Hg.      Review of Systems: [y] = yes, [ ]  = no    General: Weight gain [ Y]; Weight loss [ ] ; Anorexia [ ] ; Fatigue [ ] ; Fever [ ] ; Chills [ ] ; Weakness [ ]    Cardiac: Chest pain/pressure [ ] ; Resting SOB [Y ]; Exertional SOB [ Y]; Orthopnea [ ] ; Pedal Edema [ ] ; Palpitations [ ] ; Syncope [ ] ; Presyncope [ ] ; Paroxysmal nocturnal dyspnea[ ]    Pulmonary: Cough [ ] ;  Wheezing[ ] ; Hemoptysis[ ] ; Sputum [ ] ; Snoring [ ]    GI: Vomiting[ ] ; Dysphagia[ ] ; Melena[ ] ; Hematochezia [ ] ; Heartburn[ ] ; Abdominal pain [ ] ; Constipation [ ] ; Diarrhea [ ] ; BRBPR [ ]    GU: Hematuria[ ] ; Dysuria [ ] ; Nocturia[ ]    Vascular: Pain in legs with walking [ ] ; Pain in feet with lying flat [ ] ; Non-healing sores [ ] ; Stroke [ ] ; TIA [ ] ; Slurred speech [ ] ;   Neuro: Headaches[ ] ; Vertigo[ ] ; Seizures[ ] ; Paresthesias[ ] ;Blurred vision [ ] ; Diplopia [ ] ; Vision changes [ ]    Ortho/Skin: Arthritis [ ] ; Joint pain [ ] ;  Muscle pain [ ] ; Joint swelling [ ] ; Back Pain [ ] ; Rash [ ]    Psych: Depression[ ] ; Anxiety[ ]    Heme: Bleeding problems [ ] ; Clotting disorders [ ] ; Anemia [ ]    Endocrine: Diabetes [ ] ; Thyroid dysfunction[ ]   Home Medications Prior to Admission medications   Medication Sig Start Date End Date Taking? Authorizing Provider  acetaminophen (TYLENOL) 325 MG tablet Take 2 tablets (650 mg total) by mouth every 4 (four) hours as needed for headache or mild pain. 09/23/17   Shirley Friar, PA-C  amiodarone (PACERONE) 200 MG tablet TAKE 1 TABLET(200 MG) BY MOUTH DAILY 12/28/18   Larey Dresser, MD  carvedilol (COREG) 12.5 MG tablet Take 1 tablet (12.5 mg total) by mouth 2 (two) times daily. 12/21/18 12/21/19  Larey Dresser, MD  ELIQUIS 5 MG TABS tablet TAKE 1 TABLET(5 MG) BY MOUTH TWICE DAILY Patient taking differently: Take 5 mg by mouth 2 (two) times daily.  11/10/18   Larey Dresser, MD  ferrous sulfate 325 (65 FE) MG tablet Take 325 mg by mouth daily with breakfast.    [provider]  gabapentin (NEURONTIN) 100 MG capsule Take 100 mg by mouth 2 (two) times daily as needed (nerve pain).    [provider]  levothyroxine (SYNTHROID) 75 MCG tablet Take 75 mcg by mouth daily before breakfast.    [provider]  metolazone (ZAROXOLYN) 2.5 MG tablet TAKE 1 TABLET BY MOUTH ONCE WEEKLY ON WEDNESDAY MORNINGS Patient taking differently: Take 2.5 mg by mouth once a week. Wednesdays 12/21/18   Larey Dresser, MD  Multiple Vitamin (MULTIVITAMIN) tablet Take 1 tablet by mouth daily.    [provider]  Multiple Vitamins-Minerals (PRESERVISION AREDS 2+MULTI VIT PO) Take 1 capsule by mouth daily.    [provider]  potassium chloride SA (K-DUR,KLOR-CON) 20 MEQ tablet Take 1 tablet (20 mEq total) by mouth daily. 05/22/18   Larey Dresser, MD  torsemide (DEMADEX) 20 MG tablet Take 2 tablets (40 mg total) by mouth daily. 09/03/18   Larey Dresser, MD     Past Medical History: Past Medical History:  Diagnosis Date   Acute renal failure [facility])     hospitalized... December 08, 2009... improved with hydration in the hospital   Anemia    further workup needed... October, 2011.   Aortic insufficiency    mild...echo... June 2 010   Arthritis    Hips/Knees, severe, requiring a walker   Asymmetric septal hypertrophy (HCC)    echo....04/2009....patient encouraged the family to be screened elsewhere   Atrial fibrillation [facility])     ??? atrial fibrillation during hospitalization October, 2011 ???..   Bradycardia    CAD (coronary artery disease)    Mild coronary disease, catheterization, 2009   Cardiomyopathy, nonischemic ([place])    EF improved October, 2011  Ejection fraction    EF 25%...nondiagnostic MRI December 2009...  /  echo June, 2010  /   echo... December 08, 2009.... ejection fraction improved... EF 60% /  EF 30%... echo.... [place]... December 28, 2009 with CHFPLanned ICD / CRT... patient has seen Dr.Klein..EF improved October, 2011    Groin hematoma    Right-hematoma/abscess..repaired.. December, 2009   Hypertension    LBBB (left bundle branch block)    LVH (left ventricular hypertrophy)    moderately severe... echo.. June, 2010  /  EF improved October, 2011.... cancel plans for ICD   Mitral regurgitation    mild/ moderate...echo June, 2010  /   echo.. October, 2011.... mitral regurgitation  improved   OSA (obstructive sleep apnea)    Renal artery stenosis (HCC)    80% upper left    Systolic heart failure    chronic    Past Surgical History: Past Surgical History:  Procedure Laterality Date   ABDOMINAL HYSTERECTOMY     AV NODE ABLATION N/A 09/19/2017   Procedure: AV NODE ABLATION;  Surgeon: Deboraha Sprang, MD;  Location: [place] CV LAB;  Service: Cardiovascular;  Laterality: N/A;   BIV PACEMAKER INSERTION CRT-P N/A 09/19/2017   Procedure: BIV PACEMAKER INSERTION CRT-P;  Surgeon: Deboraha Sprang,  MD;  Location: [place] CV LAB;  Service: Cardiovascular;  Laterality: N/A;   BIV PACEMAKER INSERTION CRT-P N/A 12/28/2018   Procedure: Upgrade to [place] CRT-P;  Surgeon: Evans Lance, MD;  Location: [place] CV LAB;  Service: Cardiovascular;  Laterality: N/A;   CARDIAC CATHETERIZATION N/A 08/14/2015   Procedure: Right/Left Heart Cath and Coronary Angiography;  Surgeon: Leonie Man, MD;  Location: [place] CV LAB;  Service: Cardiovascular;  Laterality: N/A;   CARDIOVERSION N/A 09/11/2017   Procedure: CARDIOVERSION;  Surgeon: Larey Dresser, MD;  Location: [facility] ENDOSCOPY;  Service: Cardiovascular;  Laterality: N/A;   CARDIOVERSION N/A 09/14/2017   Procedure: CARDIOVERSION;  Surgeon: Larey Dresser, MD;  Location: [facility] OR;  Service: Cardiovascular;  Laterality: N/A;   CARDIOVERSION N/A 09/18/2017   Procedure: CARDIOVERSION FOR ATRIAL FIBRILLATION;  Surgeon: Larey Dresser, MD;  Location: [facility] ENDOSCOPY;  Service: Cardiovascular;  Laterality: N/A;   REPLACEMENT TOTAL HIP W/  RESURFACING IMPLANTS     REPLACEMENT TOTAL KNEE     Surgical Repair of a Catheteriztion associated femoral artery injury      Family History: Family History  Problem Relation Age of Onset   Rheumatic fever Mother        in her early 43's   Alzheimer's disease Mother    Stomach cancer Father 5       died 06/17/98   Bradycardia Brother        has pacemaker   Heart failure Paternal Grandmother    Heart failure Paternal Grandfather     Social History: Social History   Socioeconomic History   Marital status: Married    Spouse name: Not on file   Number of children: 0   Years of education: Not on file   Highest education level: Not on file  Occupational History   Not on file  Social Needs   Financial resource strain: Not on file   Food insecurity    Worry: Not on file    Inability: Not on file   Transportation needs    Medical: Not on file    Non-medical: Not  on file  Tobacco Use   Smoking status: Former Smoker  Packs/day: 1.00    Years: 24.00    Pack years: 24.00    Types: Cigarettes    Start date: 10/29/1958    Quit date: 12/02/1985    Years since quitting: 33.1   Smokeless tobacco: Never Used  Substance and Sexual Activity   Alcohol use: No    Alcohol/week: 0.0 standard drinks   Drug use: Not on file   Sexual activity: Not on file  Lifestyle   Physical activity    Days per week: Not on file    Minutes per session: Not on file   Stress: Not on file  Relationships   Social connections    Talks on phone: Not on file    Gets together: Not on file    Attends religious service: Not on file    Active member of club or organization: Not on file    Attends meetings of clubs or organizations: Not on file    Relationship status: Not on file  Other Topics Concern   Not on file  Social History Narrative   Not on file    Allergies:  Allergies  Allergen Reactions   Bactrim [Sulfamethoxazole-Trimethoprim] Other (See Comments)    Flu like symptoms   Cephalexin Rash    REACTION: Rash to arms/legs    Objective:    Vital Signs:   Temp:  [97.8 F (36.6 C)] 97.8 F (36.6 C) (11/25 1128) Pulse Rate:  [69-77] 69 (11/25 1330) Resp:  [20-24] 21 (11/25 1330) BP: (112-122)/(68-77) 117/71 (11/25 1330) SpO2:  [93 %-100 %] 100 % (11/25 1330) Weight:  [96.6 kg] 96.6 kg (11/25 1229)    Weight change: Filed Weights   01/27/19 1229  Weight: 96.6 kg    Intake/Output:  No intake or output data in the 24 hours ending 01/27/19 1436    Physical Exam    General:  Elderly WF, moderately obese, Well appearing. No resp difficulty at rest HEENT: normal Neck: supple. elevated JVP to ear . Carotids 2+ bilat; no bruits. No lymphadenopathy or thyromegaly appreciated. Cor: PMI nondisplaced. Regular rate & rhythm. 2/6 MR murmur at apex Lungs: clear Abdomen: obese, soft, nontender, nondistended. No hepatosplenomegaly. No bruits or  masses. Good bowel sounds. Extremities: no cyanosis, clubbing, rash, trace bilateral LE edema, chronic venous stasis dermatitis bilaterally  Neuro: alert & orientedx3, cranial nerves grossly intact. moves all 4 extremities w/o difficulty. Affect pleasant  Telemetry   Vpaced 80s  EKG    EKG w/ V paced rhythm, w/ occasional PVCs, 78 bpm   Labs   Basic Metabolic Panel: Recent Labs  Lab 01/27/19 1233  NA 138  K 3.7  CL 94*  CO2 28  GLUCOSE 122*  BUN 126*  CREATININE 4.50*  CALCIUM 9.4    Liver Function Tests: No results for input(s): AST, ALT, ALKPHOS, BILITOT, PROT, ALBUMIN in the last 168 hours. No results for input(s): LIPASE, AMYLASE in the last 168 hours. No results for input(s): AMMONIA in the last 168 hours.  CBC: Recent Labs  Lab 01/27/19 1233  WBC 6.3  HGB 10.5*  HCT 33.9*  MCV 108.0*  PLT 253    Cardiac Enzymes: No results for input(s): CKTOTAL, CKMB, CKMBINDEX, TROPONINI in the last 168 hours.  BNP: BNP (last 3 results) Recent Labs    01/27/19 1235  BNP >4,500.0*    ProBNP (last 3 results) No results for input(s): PROBNP in the last 8760 hours.   CBG: No results for input(s): GLUCAP in the last 168 hours.  Coagulation Studies:  Recent Labs    01/27/19 1233  LABPROT 26.8*  INR 2.5*     Imaging   Dg Chest 2 View  Result Date: 01/27/2019 CLINICAL DATA:  Shortness of breath, history of CHF. EXAM: CHEST - 2 VIEW COMPARISON:  09/20/2017 FINDINGS: Heart size is markedly enlarged as before. Pulmonary vascular congestion is noted without frank edema or signs of pleural effusion. No signs of consolidation. Left-sided dual lead pacer device in situ as before. No acute bone finding. Signs of calcified atherosclerosis. IMPRESSION: Cardiomegaly and pulmonary vascular congestion. No frank edema or pleural effusions. Electronically Signed   By: Zetta Bills M.D.   On: 01/27/2019 12:06      Medications:     Current Medications:  sodium  chloride flush  3 mL Intravenous Once     Infusions:     Patient Profile   78 y/o female w/ chronic systolic CHF 2/2 NICM, EF 20%, mild RV dysfunction, normal LHC in 2017, LBBB, Stage 5 CKD nearing HD, Atrial Fibrillation s/p AV nodal ablation with St Jude PPM in 2019 w/ recent failed attempt at upgrade to CRT-P due to left subclavian vein occlusion blocking access, chronic anticoagulation w/ Eliquis, chronic anemia, HTN, renal artery stenosis and OSA on CPAP, admitted for acute on chronic systolic CHF w/ NYHA Class IIIb symptoms.  Assessment/Plan   1. Acute on Chronic Systolic CHF/ Biventricular Failure:Long-standing cardiomyopathy, nonischemic based on cath in 2017. 7/19 hospitalization withlow output HF likely triggered by atrial fibrillation/RVR. She was started on milrinone. DCCVs failed and she ultimately had AV nodal ablation with St Jude PPM placed (unable to place LV lead and recent failed attempt at CRT-P upgrade due to Lt subclavian vein occlusion blocking acess). Echo in 6/20 showed EF 20%. RV systolic function mildly decreased.  - now w/ NYHA IIIb symptoms and volume overload w/ 20+ lb wt gain -Will need admission and treatment w/ IV diuretics. If poor response to diuretics, may need addition of inotropic therapy to help w/ diuresis. - On Coreg 12.5 mg as outpatient, but may need to discontinue given potential low output - No ACEI/ARNI/ARB/ spironolactone or digoxinwith CKD. May need hydralazine + nitrate for afterload reduction.   2. CKD stage 5: She sees nephrology (Dr. Lowanda Foster). Now nearing HD. GFR 9. Still making urine. Awaiting vascular surgery for permanent HD access. Recent SCr baseline 3.6-4.3.  - SCr 4.5 today. BUN 126.  - Will attempt to diurese w/ IV Lasix 120 mg bid  -Daily BMPs while diuresing - concern for cardiorenal syndrome. May benefit from inotropic therapy w/ dobutamine. If poor response to medical therapy, may need nephrology consultation and  HD initiation this admit   3. Atrial fibrillation: Chronic. She is now s/p AV nodal ablation with St Jude PPM. Unable to place LV lead in the past and recent failed attempt at CRT-P upgrade due to Lt subclavian vein occlusion blocking acess) -Continue Eliquis 5 mg twice a day.  4. OSA - Continue CPAP qHS.  5. PVCs: She is on amiodarone for frequent PVCs (?contributing to cardiomyopathy). Zio patch in 4/20 on amiodarone showed only rare PVCs.  - Continue amiodarone 200 mg daily. - Check TFTs and HFTs   6. Hypothyroidism: recent diagnosis and now on levothyroxine - check TFTs  7. Mitral Regurgitation: moderate MR on TTE 6/20. Suspect functional MR 2/2 dilated LA/LV   Length of Stay: 0  Lyda Jester, PA-C  01/27/2019, 2:36 PM  Advanced Heart Failure Team Pager [phone] (M-F; 7a - 4p)  Please  contact [facility] for night-coverage after hours (4p -7a ) and weekends on [email or web address]  Patient seen and examined with the above-signed Advanced Practice Provider and/or Housestaff. I personally reviewed laboratory data, imaging studies and relevant notes. I independently examined the patient and formulated the important aspects of the plan. I have edited the note to reflect any of my changes or salient points. I have personally discussed the plan with the patient and/or family.  78 y/o woman with h/o severe systolic HF due to NICM EF 20%, CKD IV-V, chronic AF with AVN ablation and RV pacing (unable to place LV lead) and moderate MR.   Presents to ED today with recurrent HF and 20 pound weight gain with R>>L symptoms. Failed uptitration of po diuretics.   In ED, creatinine 4.5 and BUN 126 which is not far from baseline. No evidence of uremia or acidosis. Is followed by Renal in [place]. Has not had access placed yet.   Has known subclavian vein stenosis and prominent varicosities in R neck suspicious for deep vein occlusion as well   On exam  General:  Elderly  No resp  difficulty HEENT: normal Neck: supple. JVP to jaw with prominent superficial varicsoities. Carotids 2+ bilat; no bruits. No lymphadenopathy or thryomegaly appreciated. Cor: PMI nondisplaced. Regular rate & rhythm. 2/6 MR and TR Lungs: clear Abdomen: soft, nontender, + distended. No hepatosplenomegaly. No bruits or masses. Good bowel sounds. Extremities: no cyanosis, clubbing, rash, trace -1+edema Neuro: alert & orientedx3, cranial nerves grossly intact. moves all 4 extremities w/o difficulty. Affect pleasant  Very difficult situation. She has advanced biventricular failure due to NICM (? RV pacing contributing) now with progressive/ESRD. Presents with R>>L HF and 20 pounds of volume overload. Will start high-dose IV lasix but will likely need HD for volume management and clearance. Unfortunately with low EF doubt she will tolerate iHD for long. May be nearing a palliative situation. Access seem like it may be an issue as well.   Await Renal consultation.   Glori Bickers, MD  6:30 PM

## 2019-01-27 NOTE — Procedures (Signed)
Patient declined the use of our CPAP for tonight.  RT will continue to monitor.

## 2019-01-28 DIAGNOSIS — N179 Acute kidney failure, unspecified: Secondary | ICD-10-CM

## 2019-01-28 DIAGNOSIS — L8990 Pressure ulcer of unspecified site, unspecified stage: Secondary | ICD-10-CM | POA: Insufficient documentation

## 2019-01-28 DIAGNOSIS — I5043 Acute on chronic combined systolic (congestive) and diastolic (congestive) heart failure: Secondary | ICD-10-CM

## 2019-01-28 LAB — COMPREHENSIVE METABOLIC PANEL
ALT: 99 U/L — ABNORMAL HIGH (ref 0–44)
AST: 41 U/L (ref 15–41)
Albumin: 3.3 g/dL — ABNORMAL LOW (ref 3.5–5.0)
Alkaline Phosphatase: 66 U/L (ref 38–126)
Anion gap: 17 — ABNORMAL HIGH (ref 5–15)
BUN: 126 mg/dL — ABNORMAL HIGH (ref 8–23)
CO2: 27 mmol/L (ref 22–32)
Calcium: 9.3 mg/dL (ref 8.9–10.3)
Chloride: 96 mmol/L — ABNORMAL LOW (ref 98–111)
Creatinine, Ser: 4.55 mg/dL — ABNORMAL HIGH (ref 0.44–1.00)
GFR calc Af Amer: 10 mL/min — ABNORMAL LOW (ref >60)
GFR calc non Af Amer: 9 mL/min — ABNORMAL LOW (ref >60)
Glucose, Bld: 103 mg/dL — ABNORMAL HIGH (ref 70–99)
Potassium: 3.4 mmol/L — ABNORMAL LOW (ref 3.5–5.1)
Sodium: 140 mmol/L (ref 135–145)
Total Bilirubin: 1.6 mg/dL — ABNORMAL HIGH (ref 0.3–1.2)
Total Protein: 5.7 g/dL — ABNORMAL LOW (ref 6.5–8.1)

## 2019-01-28 LAB — CBC
HCT: 30.4 % — ABNORMAL LOW (ref 36.0–46.0)
Hemoglobin: 9.5 g/dL — ABNORMAL LOW (ref 12.0–15.0)
MCH: 33.8 pg (ref 26.0–34.0)
MCHC: 31.3 g/dL (ref 30.0–36.0)
MCV: 108.2 fL — ABNORMAL HIGH (ref 80.0–100.0)
Platelets: 221 10*3/uL (ref 150–400)
RBC: 2.81 MIL/uL — ABNORMAL LOW (ref 3.87–5.11)
RDW: 15.9 % — ABNORMAL HIGH (ref 11.5–15.5)
WBC: 5.2 10*3/uL (ref 4.0–10.5)
nRBC: 1.2 % — ABNORMAL HIGH (ref 0.0–0.2)

## 2019-01-28 LAB — IRON AND TIBC
Iron: 80 ug/dL (ref 28–170)
Saturation Ratios: 25 % (ref 10.4–31.8)
TIBC: 323 ug/dL (ref 250–450)
UIBC: 243 ug/dL

## 2019-01-28 LAB — FERRITIN: Ferritin: 214 ng/mL (ref 11–307)

## 2019-01-28 MED ORDER — FUROSEMIDE 10 MG/ML IJ SOLN
120.0000 mg | Freq: Two times a day (BID) | INTRAVENOUS | Status: DC
  Administered 2019-01-28: 120 mg via INTRAVENOUS
  Filled 2019-01-28: qty 12
  Filled 2019-01-28: qty 2
  Filled 2019-01-28: qty 12

## 2019-01-28 MED ORDER — POTASSIUM CHLORIDE CRYS ER 20 MEQ PO TBCR
20.0000 meq | EXTENDED_RELEASE_TABLET | Freq: Once | ORAL | Status: AC
  Administered 2019-01-28: 20 meq via ORAL
  Filled 2019-01-28: qty 1

## 2019-01-28 MED ORDER — DARBEPOETIN ALFA 40 MCG/0.4ML IJ SOSY
40.0000 ug | PREFILLED_SYRINGE | Freq: Once | INTRAMUSCULAR | Status: AC
  Administered 2019-01-28: 40 ug via SUBCUTANEOUS
  Filled 2019-01-28: qty 0.4

## 2019-01-28 MED ORDER — CHLORHEXIDINE GLUCONATE CLOTH 2 % EX PADS
6.0000 | MEDICATED_PAD | Freq: Every day | CUTANEOUS | Status: DC
  Administered 2019-01-29 – 2019-01-31 (×2): 6 via TOPICAL

## 2019-01-28 NOTE — Progress Notes (Signed)
Patient ID: Gina [name], female   DOB: 22-Oct-1940, 78 y.o.   MRN: [id number]     Advanced Heart Failure Rounding Note  PCP-Cardiologist: Carlyle Dolly, MD   Subjective:    UOP about 1050 cc yesterday with high dose IV Lasix.  BUN/creatinine 126/4.5 => 126/4.55.  She is alert/oriented.  No dyspnea at rest.   Objective:   Weight Range: 99.5 kg Body mass index is 37.65 kg/m.   Vital Signs:   Temp:  [97.6 F (36.4 C)-99.3 F (37.4 C)] 99.3 F (37.4 C) (11/26 0743) Pulse Rate:  [68-78] 75 (11/26 0743) Resp:  [18-24] 18 (11/26 0743) BP: (96-134)/(59-91) 120/71 (11/26 0743) SpO2:  [90 %-100 %] 90 % (11/26 0743) Weight:  [96.6 kg-104.9 kg] 99.5 kg (11/26 0300) Last BM Date: 01/27/19  Weight change: Filed Weights   01/27/19 1229 01/27/19 1700 01/28/19 0300  Weight: 96.6 kg 104.9 kg 99.5 kg    Intake/Output:   Intake/Output Summary (Last 24 hours) at 01/28/2019 0819 Last data filed at 01/28/2019 0547 Gross per 24 hour  Intake 240 ml  Output 1050 ml  Net -810 ml      Physical Exam    General:  Well appearing. No resp difficulty HEENT: Normal Neck: Supple. JVP 14 cm. Carotids 2+ bilat; no bruits. No lymphadenopathy or thyromegaly appreciated. Cor: PMI lateral. Regular rate & rhythm. No rubs, gallops or murmurs. Lungs: Decreased at bases.  Abdomen: Soft, nontender, nondistended. No hepatosplenomegaly. No bruits or masses. Good bowel sounds. Extremities: No cyanosis, clubbing, rash. 1+ ankle edema.  Neuro: Alert & orientedx3, cranial nerves grossly intact. moves all 4 extremities w/o difficulty. Affect pleasant   Telemetry   Atrial fibrillation with V-pacing rate 70s. Personally reviewed  Labs    CBC Recent Labs    01/27/19 1652 01/28/19 0237  WBC 5.5 5.2  HGB 10.1* 9.5*  HCT 32.5* 30.4*  MCV 107.3* 108.2*  PLT 234 [id number]   Basic Metabolic Panel Recent Labs    01/27/19 1233 01/27/19 1652 01/28/19 0237  NA 138  --  140  K 3.7  --  3.4*  CL 94*  --   96*  CO2 28  --  27  GLUCOSE 122*  --  103*  BUN 126*  --  126*  CREATININE 4.50* 4.36* 4.55*  CALCIUM 9.4  --  9.3  MG  --  2.6*  --   PHOS  --  5.0*  --    Liver Function Tests Recent Labs    01/27/19 1652 01/28/19 0237  AST 47* 41  ALT 103* 99*  ALKPHOS 69 66  BILITOT 1.7* 1.6*  PROT 6.4* 5.7*  ALBUMIN 3.5 3.3*   No results for input(s): LIPASE, AMYLASE in the last 72 hours. Cardiac Enzymes No results for input(s): CKTOTAL, CKMB, CKMBINDEX, TROPONINI in the last 72 hours.  BNP: BNP (last 3 results) Recent Labs    01/27/19 1235  BNP >4,500.0*    ProBNP (last 3 results) No results for input(s): PROBNP in the last 8760 hours.   D-Dimer No results for input(s): DDIMER in the last 72 hours. Hemoglobin A1C No results for input(s): HGBA1C in the last 72 hours. Fasting Lipid Panel No results for input(s): CHOL, HDL, LDLCALC, TRIG, CHOLHDL, LDLDIRECT in the last 72 hours. Thyroid Function Tests Recent Labs    01/27/19 1652  TSH 5.138*    Other results:   Imaging    Dg Chest 2 View  Result Date: 01/27/2019 CLINICAL DATA:  Shortness of breath, history  of CHF. EXAM: CHEST - 2 VIEW COMPARISON:  09/20/2017 FINDINGS: Heart size is markedly enlarged as before. Pulmonary vascular congestion is noted without frank edema or signs of pleural effusion. No signs of consolidation. Left-sided dual lead pacer device in situ as before. No acute bone finding. Signs of calcified atherosclerosis. IMPRESSION: Cardiomegaly and pulmonary vascular congestion. No frank edema or pleural effusions. Electronically Signed   By: Zetta Bills M.D.   On: 01/27/2019 12:06      Medications:     Scheduled Medications: . amiodarone  200 mg Oral Daily  . apixaban  5 mg Oral BID  . carvedilol  12.5 mg Oral BID  . ferrous sulfate  325 mg Oral Q breakfast  . levothyroxine  75 mcg Oral QAC breakfast  . sodium chloride flush  3 mL Intravenous Once     Infusions: . furosemide        PRN Medications:  acetaminophen **OR** acetaminophen, ondansetron **OR** ondansetron (ZOFRAN) IV   Assessment/Plan   1. Acute on chronic Systolic CHF: Echo (A999333) with EF 20%. Long-standing cardiomyopathy, nonischemic based on cath in 2017. 7/19 hospitalization withlow output HF likely triggered by atrial fibrillation/RVR.  She was started on milrinone.  DCCVs failed and she ultimately had AV nodal ablation with St Jude PPM placed (unable to place LV lead).  Echo in 6/20 showed EF 20%.  Re-attempted LV lead in 10/20, unable to place due to occluded left subclavian vein.  Recently, she has had worsening renal function and has gained 20 lbs, admitted with CHF exacerbation and AKI on CKD stage 5, BUN very high.  She is volume overloaded today, some diuresis with high dose IV Lasix.  - Continue Lasix 160 mg IV bid.    - Continue Coreg 12.5 mg bid.    - No ACEI/ARNI/ARB/spironolactone with elevated creatinine.  - She has a high percentage of RV pacing, have failed LV lead placement so far.  Discussed with Dr. Lovena Le => looks like she is likely to end up needing HD (not sure that BiV pacing would prevent HD at this point), and re-instrumenting her in the setting of a dialysis catheter would have high risk of infection.   2. AKI on CKD stage 5: BUN markedly high but she seems clear mentally.  Labs stable today compared to yesterday.  No urgent need for HD but suspect that she is getting close.  For now, continue high dose IV Lasix.  Nephrology following.  3. Atrial fibrillation: Chronic.   She is now s/p AV nodal ablation with [employer] Jude PPM.  -Continue Eliquis 5 mg twice a day.  4. OSA - Continue CPAP qHS. 5. PVCs: She is on amiodarone for frequent PVCs (?contributing to cardiomyopathy).  Zio patch in 4/20 on amiodarone showed only rare PVCs.  - Continue amiodarone 200 mg daily.    6. Mitral regurgitation: moderate on 6/20 echo.   Length of Stay: 1  [name], MD  01/28/2019, 8:19 AM   Advanced Heart Failure Team Pager [phone] (M-F; 7a - 4p)  Please contact [facility] for night-coverage after hours (4p -7a ) and weekends on [email or web address]

## 2019-01-28 NOTE — Progress Notes (Signed)
Progress Note  Patient Name: Gina [name] Date of Encounter: 01/28/2019  Primary Cardiologist: Carlyle Dolly, MD   Subjective   "I am breathing better."  Inpatient Medications    Scheduled Meds: . amiodarone  200 mg Oral Daily  . apixaban  5 mg Oral BID  . carvedilol  12.5 mg Oral BID  . ferrous sulfate  325 mg Oral Q breakfast  . levothyroxine  75 mcg Oral QAC breakfast  . sodium chloride flush  3 mL Intravenous Once   Continuous Infusions: . furosemide 160 mg (01/28/19 0820)   PRN Meds: acetaminophen **OR** acetaminophen, ondansetron **OR** ondansetron (ZOFRAN) IV   Vital Signs    Vitals:   01/28/19 0300 01/28/19 0341 01/28/19 0743 01/28/19 0856  BP:  106/72 120/71   Pulse:  71 75   Resp:  20 18   Temp:  97.6 F (36.4 C) 99.3 F (37.4 C)   TempSrc:  Oral Oral   SpO2:  100% 90%   Weight: 99.5 kg   55.1 kg  Height:        Intake/Output Summary (Last 24 hours) at 01/28/2019 0940 Last data filed at 01/28/2019 0900 Gross per 24 hour  Intake 780 ml  Output 1050 ml  Net -270 ml   Filed Weights   01/27/19 1700 01/28/19 0300 01/28/19 0856  Weight: 104.9 kg 99.5 kg 55.1 kg    Telemetry    Atrial fib with ventricular pacing - Personally Reviewed  ECG    Atrial fib with ventricular pacing - Personally Reviewed  Physical Exam   GEN: No acute distress.   Neck: No JVD Cardiac: RRR, no murmurs, rubs, or gallops.  Respiratory: Clear to auscultation bilaterally. GI: Soft, nontender, non-distended  MS: No edema; No deformity. Neuro:  Nonfocal  Psych: Normal affect   Labs    Chemistry Recent Labs  Lab 01/27/19 1233 01/27/19 1652 01/28/19 0237  NA 138  --  140  K 3.7  --  3.4*  CL 94*  --  96*  CO2 28  --  27  GLUCOSE 122*  --  103*  BUN 126*  --  126*  CREATININE 4.50* 4.36* 4.55*  CALCIUM 9.4  --  9.3  PROT  --  6.4* 5.7*  ALBUMIN  --  3.5 3.3*  AST  --  47* 41  ALT  --  103* 99*  ALKPHOS  --  69 66  BILITOT  --  1.7* 1.6*   GFRNONAA 9* 9* 9*  GFRAA 10* 11* 10*  ANIONGAP 16*  --  17*     Hematology Recent Labs  Lab 01/27/19 1233 01/27/19 1652 01/28/19 0237  WBC 6.3 5.5 5.2  RBC 3.14* 3.03* 2.81*  HGB 10.5* 10.1* 9.5*  HCT 33.9* 32.5* 30.4*  MCV 108.0* 107.3* 108.2*  MCH 33.4 33.3 33.8  MCHC 31.0 31.1 31.3  RDW 15.6* 16.0* 15.9*  PLT 253 234 221    Cardiac EnzymesNo results for input(s): TROPONINI in the last 168 hours. No results for input(s): TROPIPOC in the last 168 hours.   BNP Recent Labs  Lab 01/27/19 1235  BNP >4,500.0*     DDimer No results for input(s): DDIMER in the last 168 hours.   Radiology    Dg Chest 2 View  Result Date: 01/27/2019 CLINICAL DATA:  Shortness of breath, history of CHF. EXAM: CHEST - 2 VIEW COMPARISON:  09/20/2017 FINDINGS: Heart size is markedly enlarged as before. Pulmonary vascular congestion is noted without frank edema or signs  of pleural effusion. No signs of consolidation. Left-sided dual lead pacer device in situ as before. No acute bone finding. Signs of calcified atherosclerosis. IMPRESSION: Cardiomegaly and pulmonary vascular congestion. No frank edema or pleural effusions. Electronically Signed   By: Zetta Bills M.D.   On: 01/27/2019 12:06    Cardiac Studies   none  Patient Profile     78 y.o. female admitted with worsening volume overload and renal insufficiency  Assessment & Plan    1. Pacing induced LBBB - her QRS is about 200 ms. We discussed the possibility of upgrade. She has an occluded left subclavian vein. However her device has only been in place about 16 months. She has atrial fib which is chronic. She could have her atrial lead removed and used for access. I have discussed the with her and we will plan as our schedule allows and her CHF and renal function can be optimized.  2. Chronic systolic heart failure - her symptoms are class 3. Her volume status and dyspnea are improved.   Teresa Lemmerman,M.D.     For questions or  updates, please contact [facility] Please consult [email or web address] for contact info under Cardiology/STEMI.      Signed, Cristopher Peru, MD  01/28/2019, 9:40 AM  Patient ID: Gina [name], female   DOB: 1940/06/22, 78 y.o.   MRN: [id number]

## 2019-01-28 NOTE — Progress Notes (Signed)
PROGRESS NOTE    Gina [name]  [id number] DOB: 1940/09/06 DOA: 01/27/2019 PCP: Glenda Chroman, MD    Brief Narrative:  78 y.o. female with medical history significant of chronic systolic congestive heart failure due to nonischemic cardiomyopathy with ejection fraction of 20%, left bundle branch block, stage V CKD, A. fib status post AV nodal ablation with [employer] PPM in 2019-on Eliquis, anemia of chronic disease, hypertension, renal artery stenosis, obstructive sleep apnea on CPAP presents to emergency department due to worsening shortness of breath since 3 weeks.  Patient tells me that she went to heart failure clinic this morning-due to her symptoms she was brought to the emergency department for further evaluation and management.  Patient reports exertional shortness of breath even with few steps, abdominal distention and weight gain.  She has been compliant with her diuretics and low-sodium diet.  No history of smoking, alcohol, illicit drug use.  She reports 20 pound weight gain, water retention mostly in the abdomen.  She has stage V kidney disease and she is nearing hemodialysis.  She is awaiting consultation with vascular surgeon for placement of HD access.  She reports that she still makes urine.  Recently diagnosed with hypothyroidism and takes levothyroxine as prescribed.  She has obstructive sleep apnea and she uses CPAP and 2 L of oxygen at bedtime only.  She denies chest pain, palpitation, leg swelling, orthopnea, PND, nausea, vomiting, abdominal pain, diarrhea, headache, blurry vision, lightheadedness or dizziness.  ED Course: Upon arrival: Patient respiratory rate in 20s, on 2 L of oxygen via nasal cannula, chest x-ray shows cardiomegaly with pulmonary vascular congestion, proBNP elevated more than 4500, troponin elevated at 61, COVID-19 pending.  EDP consulted cardiology for further evaluation and management.  Assessment & Plan:   Principal Problem:   Acute on  chronic congestive heart failure (HCC) Active Problems:   Hypertension   Mitral regurgitation   Anemia of chronic disease   OSA (obstructive sleep apnea)   Atrial fibrillation with RVR (HCC)   Pacemaker   Hypothyroidism   CKD (chronic kidney disease) stage 5, GFR less than 15 ml/min (HCC)   PVC (premature ventricular contraction)   Pressure injury of skin   Acute on chronic congestive heart failure: - CXR findings suggesting volume overload with BNP in excess of 4500 at presentation -Cardiology following -Pt is continued on 120mg  BID IV lasix -Continue Coreg 12.5mg  BID -nephrology recommends initiation of dialysis per below -Continue to diurese as per Cardiology -Repeat bmet in AM  Stage V chronic kidney disease stage end-stage renal disease: -Potassium: WNL.  BUN: 126, creatinine: 4.50 elevated from 3.60 in 1 month, GFR 9 trended down from eleven in 1 month.   -Nephrology was consulted. Recommendation to initiate HD noted  A. fib with [id number] -She is status post AV nodal ablation with [facility] Jude PPM. -Currently rate controlled -Eliquis currently on hold for HD line placement  Anemia of chronic disease: -Likely from chronic kidney disease. -H&H is stable, no signs of bleeding.  Monitor H&H. -Continued on ferrous sulfate  Hypothyroidism: Check TSH -Continue levothyroxine as tolerated  PVCs: On amiodarone at home -We will continue same.  Obstructive sleep apnea: -On CPAP at home, will continue same. -Stable at this time  DVT prophylaxis: SCD's Code Status: Full Family Communication: Pt in room, family not at bedside Disposition Plan: Uncertain at this time  Consultants:   Cardiology  Nephrology  Procedures:     Antimicrobials: Anti-infectives (From admission, onward)   None  Subjective: Reports feeling somewhat better today  Objective: Vitals:   01/28/19 0341 01/28/19 0743 01/28/19 0856 01/28/19 1221  BP: 106/72 120/71  101/67   Pulse: 71 75  75  Resp: 20 18  18   Temp: 97.6 F (36.4 C) 99.3 F (37.4 C)  98.4 F (36.9 C)  TempSrc: Oral Oral  Oral  SpO2: 100% 90%  98%  Weight:   55.1 kg   Height:        Intake/Output Summary (Last 24 hours) at 01/28/2019 1416 Last data filed at 01/28/2019 1257 Gross per 24 hour  Intake 1020 ml  Output 1950 ml  Net -930 ml   Filed Weights   01/27/19 1700 01/28/19 0300 01/28/19 0856  Weight: 104.9 kg 99.5 kg 55.1 kg    Examination:  General exam: Appears calm and comfortable  Respiratory system: Clear to auscultation. Respiratory effort normal. Cardiovascular system: S1 & S2 heard, Rregular Gastrointestinal system: Abdomen is nondistended, soft and nontender. No organomegaly or masses felt. Normal bowel sounds heard. Central nervous system: Alert and oriented. No focal neurological deficits. Extremities: Symmetric 5 x 5 power. Skin: No rashes, lesions  Psychiatry: Judgement and insight appear normal. Mood & affect appropriate.   Data Reviewed: I have personally reviewed following labs and imaging studies  CBC: Recent Labs  Lab 01/27/19 1233 01/27/19 1652 01/28/19 0237  WBC 6.3 5.5 5.2  HGB 10.5* 10.1* 9.5*  HCT 33.9* 32.5* 30.4*  MCV 108.0* 107.3* 108.2*  PLT 253 234 [id number]   Basic Metabolic Panel: Recent Labs  Lab 01/27/19 1233 01/27/19 1652 01/28/19 0237  NA 138  --  140  K 3.7  --  3.4*  CL 94*  --  96*  CO2 28  --  27  GLUCOSE 122*  --  103*  BUN 126*  --  126*  CREATININE 4.50* 4.36* 4.55*  CALCIUM 9.4  --  9.3  MG  --  2.6*  --   PHOS  --  5.0*  --    GFR: Estimated Creatinine Clearance: 8.8 mL/min (A) (by C-G formula based on SCr of 4.55 mg/dL (H)). Liver Function Tests: Recent Labs  Lab 01/27/19 1652 01/28/19 0237  AST 47* 41  ALT 103* 99*  ALKPHOS 69 66  BILITOT 1.7* 1.6*  PROT 6.4* 5.7*  ALBUMIN 3.5 3.3*   No results for input(s): LIPASE, AMYLASE in the last 168 hours. No results for input(s): AMMONIA in the last 168 hours.  Coagulation Profile: Recent Labs  Lab 01/27/19 1233  INR 2.5*   Cardiac Enzymes: No results for input(s): CKTOTAL, CKMB, CKMBINDEX, TROPONINI in the last 168 hours. BNP (last 3 results) No results for input(s): PROBNP in the last 8760 hours. HbA1C: No results for input(s): HGBA1C in the last 72 hours. CBG: No results for input(s): GLUCAP in the last 168 hours. Lipid Profile: No results for input(s): CHOL, HDL, LDLCALC, TRIG, CHOLHDL, LDLDIRECT in the last 72 hours. Thyroid Function Tests: Recent Labs    01/27/19 1652  TSH 5.138*   Anemia Panel: Recent Labs    01/28/19 0237  FERRITIN 214  TIBC 323  IRON 80   Sepsis Labs: No results for input(s): PROCALCITON, LATICACIDVEN in the last 168 hours.  Recent Results (from the past 240 hour(s))  SARS CORONAVIRUS 2 (TAT 6-24 HRS) Nasopharyngeal Nasopharyngeal Swab     Status: None   Collection Time: 01/27/19  2:53 PM   Specimen: Nasopharyngeal Swab  Result Value Ref Range Status   SARS Coronavirus 2 NEGATIVE NEGATIVE  Final    Comment: (NOTE) SARS-CoV-2 target nucleic acids are NOT DETECTED. The SARS-CoV-2 RNA is generally detectable in upper and lower respiratory specimens during the acute phase of infection. Negative results do not preclude SARS-CoV-2 infection, do not rule out co-infections with other pathogens, and should not be used as the sole basis for treatment or other patient management decisions. Negative results must be combined with clinical observations, patient history, and epidemiological information. The expected result is Negative. Fact Sheet for Patients: SugarRoll.be Fact Sheet for Healthcare Providers: https://www.woods-mathews.com/ This test is not yet approved or cleared by the [place] FDA and  has been authorized for detection and/or diagnosis of SARS-CoV-2 by FDA under an Emergency Use Authorization (EUA). This EUA will remain  in effect (meaning this  test can be used) for the duration of the COVID-19 declaration under Section 56 4(b)(1) of the Act, 21 U.S.C. section 360bbb-3(b)(1), unless the authorization is terminated or revoked sooner. Performed at [facility], [place] [address] [place], [place] [address]      Radiology Studies: Dg Chest 2 View  Result Date: 01/27/2019 CLINICAL DATA:  Shortness of breath, history of CHF. EXAM: CHEST - 2 VIEW COMPARISON:  09/20/2017 FINDINGS: Heart size is markedly enlarged as before. Pulmonary vascular congestion is noted without frank edema or signs of pleural effusion. No signs of consolidation. Left-sided dual lead pacer device in situ as before. No acute bone finding. Signs of calcified atherosclerosis. IMPRESSION: Cardiomegaly and pulmonary vascular congestion. No frank edema or pleural effusions. Electronically Signed   By: Zetta Bills M.D.   On: 01/27/2019 12:06    Scheduled Meds: . amiodarone  200 mg Oral Daily  . carvedilol  12.5 mg Oral BID  . ferrous sulfate  325 mg Oral Q breakfast  . levothyroxine  75 mcg Oral QAC breakfast  . sodium chloride flush  3 mL Intravenous Once   Continuous Infusions: . furosemide       LOS: 1 day   Marylu Lund, MD [facility] Pager On Amion  If 7PM-7AM, please contact night-coverage 01/28/2019, 2:16 PM

## 2019-01-28 NOTE — Progress Notes (Addendum)
[facility] Progress Note  Name: Gina [name] MRN: [id number] DOB: 31-Oct-1940  Chief Complaint:  Shortness of breath   Subjective:  She had 1.1 liters UOP over 11/25 with IV lasix.  She does want HD when needed but would like [place] to discuss timing of initiation with cardiology and CHF.  States normally carries fluid in lungs/abdomen - never much LE edema.  She believes procedure on her pacemaker would need to happen prior to HD access placement/initiation.  We discussed aranesp and she consents to administration.  Review of systems:  Reports shortness of breath is better  Denies n/v Appetite ok; taste intact No chest pain  ---------------- Background on consult:  Gina [name] is an 78 y.o. female.  HPI: Ms. Styczynski has an extensive PMH significant for atrial fibrillation s/p AV nodal ablation with [facility] Jude PPM on eliquis, HTN, OSA on CPAP, chronic CHF due to nonischemic CMP (EF 20%), LBBB, and CKD stage 4-5 who presented with 3 week history of worsening SOB, DOE, 20 lbs weight gain, and orthopnea.  She also has a recent diagnosis of hypothyroidism with recent increase in replacement therapy.  She has had increasing doses of torsemide per heart failure clinic prior to admission, however given her progressive symptoms she was sent to the ED.  We were consulted to help further evaluate and manage her AKI/CKD vs progressive CKD.  She is followed by Nephrology in [place], [place] and was referred for access placement due to her progressive CKD.  She has not seen the vascular surgeons yet but was aware of her worsening renal function.   She does have some nausea, anorexia, and fatigue but denies dysgeusia, increased somnolence, or myoclonic jerking.       Intake/Output Summary (Last 24 hours) at 01/28/2019 0949 Last data filed at 01/28/2019 0900 Gross per 24 hour  Intake 780 ml  Output 1050 ml  Net -270 ml    Vitals:  Vitals:   01/28/19 0300 01/28/19 0341 01/28/19 0743  01/28/19 0856  BP:  106/72 120/71   Pulse:  71 75   Resp:  20 18   Temp:  97.6 F (36.4 C) 99.3 F (37.4 C)   TempSrc:  Oral Oral   SpO2:  100% 90%   Weight: 99.5 kg   55.1 kg  Height:         Physical Exam:  General adult female in bed in no acute distress HEENT normocephalic atraumatic extraocular movements intact sclera anicteric Neck supple trachea midline Lungs basilar crackles; normal work of breathing at rest inc with exertion Heart regular rate and rhythm no rubs or gallops appreciated Abdomen soft nontender distended obese habitus Extremities trace edema dependent thighs Psych normal mood and affect Neuro - alert and oriented x 3   Medications reviewed   Labs:  BMP Latest Ref Rng & Units 01/28/2019 01/27/2019 01/27/2019  Glucose 70 - 99 mg/dL 103(H) - 122(H)  BUN 8 - 23 mg/dL 126(H) - 126(H)  Creatinine 0.44 - 1.00 mg/dL 4.55(H) 4.36(H) 4.50(H)  BUN/Creat Ratio 12 - 28 - - -  Sodium 135 - 145 mmol/L 140 - 138  Potassium 3.5 - 5.1 mmol/L 3.4(L) - 3.7  Chloride 98 - 111 mmol/L 96(L) - 94(L)  CO2 22 - 32 mmol/L 27 - 28  Calcium 8.9 - 10.3 mg/dL 9.3 - 9.4     Assessment/Plan:   1.  AKI/CKD stage 5 vs progressive CKD due to cardiorenal syndrome-  She has some mild uremic symptoms and  failed oral diuretics.  No urgent indication for dialysis, however we did discuss the potential need for dialysis if she does not respond to IV lasix.  She is amenable to proceed with HD if needed but was concerned about starting dialysis before she had an upgrade to her pacemaker (had a failed attempt to convert to biventricular pacer on 12/29/18 due to an occluded left subclavian vein).  - Recommend initiation of dialysis  - Will discuss timing with cardiology - Dr. Lovena Le and CHF  - Note for access procedures pt on eliquis - will need tunneled catheter to start  2. Acute on chronic CHF  1. Continue lasix - reduce to 120 mg IV BID from 160 IV BID for now   2. Continue to follow  strict I's/O's and daily weights.  3. Anemia of CKD stage 5- iron is replete.  Will initiate aranesp - 40 mcg on 11/26 4. A fib with AV nodal ablation- per cardiology 5. Hypothyroidism- cont with replacement therapy 6. OSA- on CPAP 7. PVC's- per Cardiology    Claudia Desanctis, MD 01/28/2019 9:49 AM   Spoke with Dr. Lovena Le and given that dialysis is imminent he would like to wait until she has a mature fistula in place without a tunneled catheter still in use prior to any procedure on her pacer.  Infection risk would be higher with a catheter in place.  He is ok with holding eliquis for access placement.   [place] with Ms. [name].  She and I discussed risks, benefits, and indications for dialysis and she does want to go ahead and start dialysis this hospitalization.  She understands we are consulting IR for access placement and making her NPO after midnight tonight as she may be able to start HD tomorrow after tunneled catheter.  She is concerned about her BUN - reports had been in the 80's before.  She is seeing vascular in [place] next week (may need to reschedule appt) and would recommend following up there to est care and place AVF soon.    She has been on eliquis, last dose 11/26 8 am.  If delay in tunneled catheter is necessary due to eliquis would defer nontunneled catheter and instead start HD when she is able to get the tunneled line.   NPO after midnight tonight  Claudia Desanctis

## 2019-01-29 DIAGNOSIS — L8940 Pressure ulcer of contiguous site of back, buttock and hip, unspecified stage: Secondary | ICD-10-CM

## 2019-01-29 LAB — RENAL FUNCTION PANEL
Albumin: 3.2 g/dL — ABNORMAL LOW (ref 3.5–5.0)
Anion gap: 17 — ABNORMAL HIGH (ref 5–15)
BUN: 124 mg/dL — ABNORMAL HIGH (ref 8–23)
CO2: 30 mmol/L (ref 22–32)
Calcium: 9 mg/dL (ref 8.9–10.3)
Chloride: 95 mmol/L — ABNORMAL LOW (ref 98–111)
Creatinine, Ser: 4.25 mg/dL — ABNORMAL HIGH (ref 0.44–1.00)
GFR calc Af Amer: 11 mL/min — ABNORMAL LOW (ref >60)
GFR calc non Af Amer: 9 mL/min — ABNORMAL LOW (ref >60)
Glucose, Bld: 105 mg/dL — ABNORMAL HIGH (ref 70–99)
Phosphorus: 4.8 mg/dL — ABNORMAL HIGH (ref 2.5–4.6)
Potassium: 2.9 mmol/L — ABNORMAL LOW (ref 3.5–5.1)
Sodium: 142 mmol/L (ref 135–145)

## 2019-01-29 LAB — POTASSIUM: Potassium: 3 mmol/L — ABNORMAL LOW (ref 3.5–5.1)

## 2019-01-29 MED ORDER — POTASSIUM CHLORIDE CRYS ER 20 MEQ PO TBCR
40.0000 meq | EXTENDED_RELEASE_TABLET | Freq: Once | ORAL | Status: AC
  Administered 2019-01-29: 40 meq via ORAL
  Filled 2019-01-29: qty 2

## 2019-01-29 MED ORDER — POTASSIUM CHLORIDE CRYS ER 20 MEQ PO TBCR
40.0000 meq | EXTENDED_RELEASE_TABLET | Freq: Once | ORAL | Status: AC
  Administered 2019-01-29: 09:00:00 40 meq via ORAL
  Filled 2019-01-29: qty 4

## 2019-01-29 MED ORDER — FUROSEMIDE 10 MG/ML IJ SOLN: 80.0000 mg | Freq: Two times a day (BID) | INTRAMUSCULAR | Status: DC

## 2019-01-29 MED ORDER — POTASSIUM CHLORIDE CRYS ER 20 MEQ PO TBCR
40.0000 meq | EXTENDED_RELEASE_TABLET | Freq: Once | ORAL | Status: AC
  Administered 2019-01-29: 16:00:00 40 meq via ORAL
  Filled 2019-01-29: qty 2

## 2019-01-29 MED ORDER — FUROSEMIDE 10 MG/ML IJ SOLN
80.0000 mg | Freq: Every day | INTRAMUSCULAR | Status: DC
  Administered 2019-01-29: 80 mg via INTRAVENOUS
  Filled 2019-01-29 (×2): qty 8

## 2019-01-29 NOTE — Progress Notes (Signed)
Patient ID: Gina [name], female   DOB: 1940-11-03, 78 y.o.   MRN: [id number]     Advanced Heart Failure Rounding Note  PCP-Cardiologist: Carlyle Dolly, MD   Subjective:    Good UOP yesterday (net -2040) with high dose IV Lasix.  BUN/creatinine 126/4.5 => 126/4.55 => 124/4.25.  She is alert/oriented.  No dyspnea at rest.   Objective:   Weight Range: 97.7 kg Body mass index is 36.97 kg/m.   Vital Signs:   Temp:  [97.5 F (36.4 C)-98.4 F (36.9 C)] 97.7 F (36.5 C) (11/27 0833) Pulse Rate:  [60-88] 88 (11/27 0833) Resp:  [18-20] 18 (11/27 0359) BP: (98-126)/(67-80) 126/75 (11/27 0833) SpO2:  [90 %-100 %] 90 % (11/27 0833) Weight:  [97.7 kg] 97.7 kg (11/27 0525) Last BM Date: 01/27/19  Weight change: Filed Weights   01/28/19 0300 01/28/19 0856 01/29/19 0525  Weight: 99.5 kg 55.1 kg 97.7 kg    Intake/Output:   Intake/Output Summary (Last 24 hours) at 01/29/2019 0933 Last data filed at 01/29/2019 0824 Gross per 24 hour  Intake 720 ml  Output 3300 ml  Net -2580 ml      Physical Exam    General: NAD Neck: JVP 12 cm, no thyromegaly or thyroid nodule.  Lungs: Clear to auscultation bilaterally with normal respiratory effort. CV: Nondisplaced PMI.  Heart regular S1/S2, no S3/S4, 1/6 SEM RUSB.  No peripheral edema.   Abdomen: Soft, nontender, no hepatosplenomegaly, no distention.  Skin: Intact without lesions or rashes.  Neurologic: Alert and oriented x 3.  Psych: Normal affect. Extremities: No clubbing or cyanosis.  HEENT: Normal.    Telemetry   Atrial fibrillation with V-pacing rate 70s. Personally reviewed  Labs    CBC Recent Labs    01/27/19 1652 01/28/19 0237  WBC 5.5 5.2  HGB 10.1* 9.5*  HCT 32.5* 30.4*  MCV 107.3* 108.2*  PLT 234 [id number]   Basic Metabolic Panel Recent Labs    01/27/19 1652 01/28/19 0237 01/29/19 0406  NA  --  140 142  K  --  3.4* 2.9*  CL  --  96* 95*  CO2  --  27 30  GLUCOSE  --  103* 105*  BUN  --  126* 124*    CREATININE 4.36* 4.55* 4.25*  CALCIUM  --  9.3 9.0  MG 2.6*  --   --   PHOS 5.0*  --  4.8*   Liver Function Tests Recent Labs    01/27/19 1652 01/28/19 0237 01/29/19 0406  AST 47* 41  --   ALT 103* 99*  --   ALKPHOS 69 66  --   BILITOT 1.7* 1.6*  --   PROT 6.4* 5.7*  --   ALBUMIN 3.5 3.3* 3.2*   No results for input(s): LIPASE, AMYLASE in the last 72 hours. Cardiac Enzymes No results for input(s): CKTOTAL, CKMB, CKMBINDEX, TROPONINI in the last 72 hours.  BNP: BNP (last 3 results) Recent Labs    01/27/19 1235  BNP >4,500.0*    ProBNP (last 3 results) No results for input(s): PROBNP in the last 8760 hours.   D-Dimer No results for input(s): DDIMER in the last 72 hours. Hemoglobin A1C No results for input(s): HGBA1C in the last 72 hours. Fasting Lipid Panel No results for input(s): CHOL, HDL, LDLCALC, TRIG, CHOLHDL, LDLDIRECT in the last 72 hours. Thyroid Function Tests Recent Labs    01/27/19 1652  TSH 5.138*    Other results:   Imaging    No results found.  Medications:     Scheduled Medications:  amiodarone  200 mg Oral Daily   carvedilol  12.5 mg Oral BID   Chlorhexidine Gluconate Cloth  6 each Topical Q0600   ferrous sulfate  325 mg Oral Q breakfast   furosemide  80 mg Intravenous Daily   levothyroxine  75 mcg Oral QAC breakfast   sodium chloride flush  3 mL Intravenous Once    Infusions:   PRN Medications: acetaminophen **OR** acetaminophen, ondansetron **OR** ondansetron (ZOFRAN) IV   Assessment/Plan   1. Acute on chronic Systolic CHF: Echo ([id number]) with EF 20%. Long-standing cardiomyopathy, nonischemic based on cath in 2017. 7/19 hospitalization withlow output HF likely triggered by atrial fibrillation/RVR.  She was started on milrinone.  DCCVs failed and she ultimately had AV nodal ablation with [facility] Jude PPM placed (unable to place LV lead).  Echo in 6/20 showed EF 20%.  Re-attempted LV lead in 10/20, unable to place due  to occluded left subclavian vein.  Recently, she has had worsening renal function and has gained 20 lbs, admitted with CHF exacerbation and AKI on CKD stage 5, BUN very high.  She has been able to diurese reasonably well with Lasix 160 mg IV bid, but with very high BUN, nephrology has decided on initiation of dialysis this admission.    - Continue Coreg 12.5 mg bid.    - No ACEI/ARNI/ARB/spironolactone with elevated creatinine.  - She has a high percentage of RV pacing, have failed LV lead placement so far.  Discussed with Dr. Lovena Le => looks like she is likely to end up needing HD (do not think that BiV pacing would prevent HD at this point), and re-instrumenting her in the setting of a dialysis catheter would have high risk of infection.  We will plan on her starting HD, then down the road when she has a functional AV fistula, will bring her back for removal of atrial lead and placement of LV lead.  - Lasix written for 80 mg IV x 1 today.   2. AKI on CKD stage 5: BUN markedly high but she seems clear mentally.  Labs stable today compared to yesterday.  With very high BUN, nephrology has decided on HD initiation at this point.  3. Atrial fibrillation: Chronic.   She is now s/p AV nodal ablation with [facility] Jude PPM.  -Eliquis on hold for catheter placement, restart afterwards.  4. OSA - Continue CPAP qHS. 5. PVCs: She is on amiodarone for frequent PVCs (?contributing to cardiomyopathy).  Zio patch in 4/20 on amiodarone showed only rare PVCs.  - Continue amiodarone 200 mg daily.    6. Mitral regurgitation: moderate on 6/20 echo.   Length of Stay: 2  [name], MD  01/29/2019, 9:33 AM  Advanced Heart Failure Team Pager [phone] (M-F; 7a - 4p)  Please contact [facility] for night-coverage after hours (4p -7a ) and weekends on [email or web address]

## 2019-01-29 NOTE — Progress Notes (Signed)
PROGRESS NOTE  Gina [name] [id number] DOB: 22-Apr-1940 DOA: 01/27/2019 PCP: Glenda Chroman, MD  Brief History   78 y.o.femalewith medical history significant ofchronic systolic congestive heart failure due to nonischemic cardiomyopathy with ejection fraction of 20%, left bundle branch block, stage V CKD, A. fib status post AV nodal ablation with [facility] PPM in 2019-on Eliquis, anemia of chronic disease, hypertension, renal artery stenosis, obstructive sleep apnea on CPAP presents to emergency department due to worsening shortness of breath since 3 weeks.  Patient tells me that she went to heart failure clinic this morning-due to her symptoms she was brought to the emergency department for further evaluation and management. Patient reports exertional shortness of breath even with few steps, abdominal distention and weight gain. She has been compliant with her diuretics and low-sodium diet. No history of smoking, alcohol, illicit drug use. She reports 20 pound weight gain, water retention mostly in the abdomen.  She has stage V kidney disease and she is nearing hemodialysis. She is awaiting consultation with vascular surgeon for placement of HD access. She reports that she still makes urine.  Recently diagnosed with hypothyroidism and takes levothyroxine as prescribed. She has obstructive sleep apnea and she uses CPAP and 2 L of oxygen at bedtime only.  She denies chest pain, palpitation, leg swelling, orthopnea, PND, nausea, vomiting, abdominal pain, diarrhea, headache, blurry vision, lightheadedness or dizziness.  ED Course:Upon arrival: Patient respiratory rate in 20s, on 2 L of oxygen via nasal cannula, chest x-ray shows cardiomegaly with pulmonary vascular congestion, proBNP elevated more than 4500, troponin elevated at 61, COVID-19 pending. EDP consulted cardiology for further evaluation and management.  The patient is being diuresed with IV Lasix 120 mg bid. Nephrology  has also been consulted as the patient is nearing initiation of dialysis. Her creatinine is elevated from 3.6 last month to 4.5 this admission.  They plan to initiate HD.  Consultants  . Heart failure . Nephrology  Procedures  . None  Antibiotics   Anti-infectives (From admission, onward)   None    .  Subjective  The patient is resting comfortably. No new complaints.   Objective   Vitals:  Vitals:   01/29/19 0359 01/29/19 0833  BP: 118/80 126/75  Pulse: 69 88  Resp: 18   Temp: (!) 97.5 F (36.4 C) 97.7 F (36.5 C)  SpO2: 96% 90%   Exam:  Constitutional:  . The patient is awake, alert, and oriented x 3. No acute distress. Respiratory:  . No increased work of breathing. . No wheezes or rhonchi . Posiitive for scattered rales at bases. . No tactile fremitus Cardiovascular:  . Regular rate and rhythm . No murmurs, ectopy, or gallups. . No lateral PMI. No thrills. Abdomen:  . Abdomen is soft, non-tender, non-distended . No hernias, masses, or organomegaly . Normoactive bowel sounds.  Musculoskeletal:  . No cyanosis, clubbing, or edema Skin:  . No rashes, lesions, ulcers . palpation of skin: no induration or nodules Neurologic:  . CN 2-12 intact . Sensation all 4 extremities intact Psychiatric:  . Mental status o Mood, affect appropriate o Orientation to person, place, time  . judgment and insight appear intact  I have personally reviewed the following:   Today's Data  . BMP, Vitals. Iron studies  Scheduled Meds: . amiodarone  200 mg Oral Daily  . carvedilol  12.5 mg Oral BID  . Chlorhexidine Gluconate Cloth  6 each Topical Q0600  . ferrous sulfate  325 mg Oral Q breakfast  .  furosemide  80 mg Intravenous Daily  . levothyroxine  75 mcg Oral QAC breakfast  . potassium chloride  40 mEq Oral Once  . sodium chloride flush  3 mL Intravenous Once   Continuous Infusions:  Principal Problem:   Acute on chronic congestive heart failure (HCC) Active  Problems:   Hypertension   Mitral regurgitation   Anemia of chronic disease   OSA (obstructive sleep apnea)   Atrial fibrillation with RVR (HCC)   Pacemaker   Hypothyroidism   CKD (chronic kidney disease) stage 5, GFR less than 15 ml/min (HCC)   PVC (premature ventricular contraction)   Pressure injury of skin   LOS: 2 days   A & P  Acute on chronic congestive heart failure: Cardiology consulted. Patient is being diuresed with lasix 80 mg IV daily. Most recent echocardiogram was in 08/2018 and demonstrated EF of 20% mildly dilated left ventricle with diffuse hypokinesis. There is mildly reduced right ventricular function. Fluid balance is currently negative 3.8 liters. Volume status is being monitored closely as is renal function and electrolytes.   Stage V chronic kidney disease stage end-stage renal disease: Creatinine 4.25 today. Monitor electrolytes, creatinine, and volume status carefully. Avoid nephrotoxins and hypotension. Nephrology has been consulted and is planning initiation of HD. She has recently been evaluated by vascular surgery for HD access.  Hypokalemia: Supplement and monitor.  A. fib with [id number]. Heart rate is controlled. She is on Eliquis 5 mg bid. She is s/p AV nodal ablation with [facility] PPM. She is being monitored on telemetry and cardiology has been consulted.  Anemia of chronic disease with mild iron deficiency: Noted and stable. Likely from chronic kidney disease. Monitor H&H and continue ferrous sulfate.  Hypothyroidism: Continue levothyroxine.  PVCs: Continue amiodarone as at home. Monitor electrolytes.  Obstructive sleep apnea: On CPAP at home, will continue same.  I have seen and examined this patient myself. I have spent 34 minutes in her evaluation and care.  DVT prophylaxis: TED/SCD/eliquis Code Status: Full code Family Communication: None available. Disposition Plan: TBD   Teale Goodgame, DO [facility] Direct contact: see  [email or web address]  7PM-7AM contact night coverage as above 01/29/2019, 2:24 PM  LOS: 2 days

## 2019-01-29 NOTE — Progress Notes (Signed)
[facility] Progress Note  Name: Gina [name] MRN: [id number] DOB: 10/14/1940  Chief Complaint:  Shortness of breath   Subjective:  She had 3.3 liters UOP over 11/26 with IV lasix.  Has been NPO in anticipation of access today with IR.  She still does want to proceed with HD.  Review of systems:   Reports shortness of breath is much better  Denies n/v Appetite ok; taste intact No chest pain  ---------------- Background on consult:  Gina [name] is an 78 y.o. female.  HPI: Gina [name] has an extensive PMH significant for atrial fibrillation s/p AV nodal ablation with [facility] Jude PPM on eliquis, HTN, OSA on CPAP, chronic CHF due to nonischemic CMP (EF 20%), LBBB, and CKD stage 4-5 who presented with 3 week history of worsening SOB, DOE, 20 lbs weight gain, and orthopnea.  She also has a recent diagnosis of hypothyroidism with recent increase in replacement therapy.  She has had increasing doses of torsemide per heart failure clinic prior to admission, however given her progressive symptoms she was sent to the ED.  We were consulted to help further evaluate and manage her AKI/CKD vs progressive CKD.  She is followed by Nephrology in [place], [place] and was referred for access placement due to her progressive CKD.  She has not seen the vascular surgeons yet but was aware of her worsening renal function.   She does have some nausea, anorexia, and fatigue but denies dysgeusia, increased somnolence, or myoclonic jerking.       Intake/Output Summary (Last 24 hours) at 01/29/2019 0734 Last data filed at 01/29/2019 0600 Gross per 24 hour  Intake 1260 ml  Output 3300 ml  Net -2040 ml    Vitals:  Vitals:   01/28/19 2019 01/29/19 0010 01/29/19 0359 01/29/19 0525  BP: 99/74 126/76 118/80   Pulse: 75 69 69   Resp: 20 18 18    Temp: 98.3 F (36.8 C) 97.8 F (36.6 C) (!) 97.5 F (36.4 C)   TempSrc: Oral Oral Oral   SpO2: 100% 98% 96%   Weight:    97.7 kg  Height:          Physical Exam:   General adult female in bed in no acute distress HEENT normocephalic atraumatic extraocular movements intact sclera anicteric Neck supple trachea midline Lungs clear to auscultation; normal work of breathing at rest  Heart regular rate and rhythm no rubs Abdomen soft nontender distended obese habitus Extremities no edema dependent thighs Psych normal mood and affect Neuro - alert and oriented x 3   Medications reviewed   Labs:  BMP Latest Ref Rng & Units 01/29/2019 01/28/2019 01/27/2019  Glucose 70 - 99 mg/dL 105(H) 103(H) -  BUN 8 - 23 mg/dL 124(H) 126(H) -  Creatinine 0.44 - 1.00 mg/dL 4.25(H) 4.55(H) 4.36(H)  BUN/Creat Ratio 12 - 28 - - -  Sodium 135 - 145 mmol/L 142 140 -  Potassium 3.5 - 5.1 mmol/L 2.9(L) 3.4(L) -  Chloride 98 - 111 mmol/L 95(L) 96(L) -  CO2 22 - 32 mmol/L 30 27 -  Calcium 8.9 - 10.3 mg/dL 9.0 9.3 -     Assessment/Plan:   1.  AKI/CKD stage 5 vs progressive CKD due to cardiorenal syndrome-  She has some mild uremic symptoms and failed oral diuretics.  No urgent indication for dialysis, however we did discuss the potential need for dialysis if she does not respond to IV lasix.  She is amenable to proceed with HD if  needed but was concerned about starting dialysis before she had an upgrade to her pacemaker (had a failed attempt to convert to biventricular pacer on 12/29/18 due to an occluded left subclavian vein).  Spoke with Dr. Lovena Le 11/26 and given that dialysis is imminent he would like to wait until she has a mature fistula in place without a tunneled catheter still in use prior to any procedure on her pacer.  Infection risk would be higher with a catheter in place.  He is ok with holding eliquis for access placement.  - Recommend initiation of dialysis  - hold eliquis for now  - Will need to CLIP - NPO for now and have consulted IR for tunneled catheter today if possible (defer nontunneled catheter - wait for tunneled line) - HD after  access placement  - decrease lasix to 80 mg IV daily for now  - will need to reschedule appt with vascular in [place] (was for next week)   2. Acute on chronic CHF  1. Continue lasix until HD initiated then follow for needs 2. Continue to follow strict I's/O's and daily weights.  3. Anemia of CKD stage 5- iron is replete.  Will initiate aranesp - 40 mcg on 11/26 4. A fib with AV nodal ablation- per cardiology 5. Hypothyroidism- cont with replacement therapy 6. OSA- on CPAP 7. PVC's- per Cardiology 8. Hypokalemia - replete K; 3K bath ordered; repeat K to monitor after repletion    Claudia Desanctis, MD 01/29/2019 7:34 AM

## 2019-01-29 NOTE — H&P (Signed)
Chief Complaint: Patient was seen in consultation today for tunneled HD catheter placement.  Referring Physician(s): Dr. Royce [name]  Supervising Physician: Gina [name]  Patient Status: Gina [name] - In-pt  History of Present Illness: Gina [name] is a 78 y.o. female with a past medical history significant for hypothyroidism, OSA on CPAP, HTN, CHF, LBBB, a.fib s/p AV nodal ablation with [facility] Jude PPM currently on Eliquis and CKD IV-V who presented to [name] on 01/27/19 with complaints of progressive dyspnea - she was seen earlier that day at the CHF clinic and they recommended she come to the ED due to her shortness of breath. She was admitted for further evaluation and management. Nephrology was consulted and they have recommended patient begin HD during this hospitalization, to which she is agreeable. IR has been asked to place a tunneled HD catheter.  Gina [name] reports that she knew prior to coming to the hospital that she would need dialysis and was planning to see vascular surgery in [place]. She tells me that she had imaging done recently on her left arm and was told that there "might be a clot or problem" but she has not heard back from the provider who ordered it. She is unable to tell me what the procedure was for or where she had the procedure done, she denied it being during this hospitalization. She tells me that she has a history of DVT and has a pacemaker. She states she takes Eliquis to "thin my blood" but does not know which medical issue she takes it for. She states understanding of tunneled HD catheter placement and agrees to proceed.   Past Medical History:  Diagnosis Date  . Acute renal failure [facility])     hospitalized... December 08, 2009... improved with hydration in the hospital  . Anemia    further workup needed... October, 2011.  [name] Aortic insufficiency    mild...echo... June 2 010  . Arthritis    Hips/Knees, severe, requiring a walker  . Asymmetric septal hypertrophy  (HCC)    echo....04/2009....patient encouraged the family to be screened elsewhere  . Atrial fibrillation ([place])     ??? atrial fibrillation during hospitalization October, 2011 ???..  . Bradycardia   . CAD (coronary artery disease)    Mild coronary disease, catheterization, 2009  . Cardiomyopathy, nonischemic ([place])    EF improved October, 2011  . CHF (congestive heart failure) ([place])   . Ejection fraction    EF 25%...nondiagnostic MRI December 2009...  /  echo June, 2010  /   echo... December 08, 2009.... ejection fraction improved... EF 60% /  EF 30%... echo.... [place]... December 28, 2009 with CHFPLanned ICD / CRT... patient has seen Dr.Klein..EF improved October, 2011   . Groin hematoma    Right-hematoma/abscess..repaired.. December, 2009  . Hypertension   . LBBB (left bundle branch block)   . LVH (left ventricular hypertrophy)    moderately severe... echo.. June, 2010  /  EF improved October, 2011.... cancel plans for ICD  . Mitral regurgitation    mild/ moderate...echo June, 2010  /   echo.. October, 2011.... mitral regurgitation  improved  . OSA (obstructive sleep apnea)   . Presence of permanent cardiac pacemaker   . Renal artery stenosis (HCC)    80% upper left   . Systolic heart failure    chronic    Past Surgical History:  Procedure Laterality Date  . ABDOMINAL HYSTERECTOMY    . AV NODE ABLATION N/A 09/19/2017   Procedure: AV NODE  ABLATION;  Surgeon: Deboraha Sprang, MD;  Location: [place] [facility];  Service: Cardiovascular;  Laterality: N/A;  . BIV PACEMAKER INSERTION CRT-P N/A 09/19/2017   Procedure: BIV PACEMAKER INSERTION CRT-P;  Surgeon: Deboraha Sprang, MD;  Location: [place] [facility];  Service: Cardiovascular;  Laterality: N/A;  . BIV PACEMAKER INSERTION CRT-P N/A 12/28/2018   Procedure: Upgrade to BIV PACEMAKER INSERTION CRT-P;  Surgeon: Evans Lance, MD;  Location: [place] [facility];  Service: Cardiovascular;  Laterality: N/A;  . CARDIAC CATHETERIZATION  N/A 08/14/2015   Procedure: Right/Left Heart Cath and Coronary Angiography;  Surgeon: Leonie Man, MD;  Location: [place] [facility];  Service: Cardiovascular;  Laterality: N/A;  . CARDIOVERSION N/A 09/11/2017   Procedure: CARDIOVERSION;  Surgeon: Larey Dresser, MD;  Location: [facility] ENDOSCOPY;  Service: Cardiovascular;  Laterality: N/A;  . CARDIOVERSION N/A 09/14/2017   Procedure: CARDIOVERSION;  Surgeon: Larey Dresser, MD;  Location: [place];  Service: Cardiovascular;  Laterality: N/A;  . CARDIOVERSION N/A 09/18/2017   Procedure: CARDIOVERSION FOR ATRIAL FIBRILLATION;  Surgeon: Larey Dresser, MD;  Location: [facility] ENDOSCOPY;  Service: Cardiovascular;  Laterality: N/A;  . REPLACEMENT TOTAL HIP W/  RESURFACING IMPLANTS    . REPLACEMENT TOTAL KNEE    . Surgical Repair of a Catheteriztion associated femoral artery injury      Allergies: Bactrim [sulfamethoxazole-trimethoprim] and Cephalexin  Medications: Prior to Admission medications   Medication Sig Start Date End Date Taking? Authorizing Provider  acetaminophen (TYLENOL) 325 MG tablet Take 2 tablets (650 mg total) by mouth every 4 (four) hours as needed for headache or mild pain. Patient taking differently: Take 650 mg by mouth at bedtime as needed (sleep).  09/23/17  Yes Shirley Friar, PA-C  amiodarone (PACERONE) 200 MG tablet TAKE 1 TABLET(200 MG) BY MOUTH DAILY Patient taking differently: Take 200 mg by mouth daily.  12/28/18  Yes Larey Dresser, MD  carvedilol (COREG) 12.5 MG tablet Take 1 tablet (12.5 mg total) by mouth 2 (two) times daily. 12/21/18 12/21/19 Yes McLean, Elby Showers, MD  ELIQUIS 5 MG TABS tablet TAKE 1 TABLET(5 MG) BY MOUTH TWICE DAILY Patient taking differently: Take 5 mg by mouth 2 (two) times daily.  11/10/18  Yes Larey Dresser, MD  ferrous sulfate 325 (65 FE) MG tablet Take 325 mg by mouth daily with breakfast.   Yes [provider]  levothyroxine (SYNTHROID) 75 MCG tablet Take 75 mcg by mouth  daily before breakfast.   Yes [provider]  metolazone (ZAROXOLYN) 2.5 MG tablet TAKE 1 TABLET BY MOUTH ONCE WEEKLY ON WEDNESDAY MORNINGS Patient taking differently: Take 2.5 mg by mouth once a week. Wednesdays 12/21/18  Yes Larey Dresser, MD  Multiple Vitamin (MULTIVITAMIN) tablet Take 1 tablet by mouth daily.   Yes [provider]  Multiple Vitamins-Minerals (PRESERVISION AREDS 2+MULTI VIT PO) Take 1 capsule by mouth daily.   Yes [provider]  potassium chloride SA (K-DUR,KLOR-CON) 20 MEQ tablet Take 1 tablet (20 mEq total) by mouth daily. 05/22/18  Yes Larey Dresser, MD  torsemide (DEMADEX) 20 MG tablet Take 2 tablets (40 mg total) by mouth daily. 09/03/18  Yes Larey Dresser, MD     Family History  Problem Relation Age of Onset  . Rheumatic fever Mother        in her early 8's  . Alzheimer's disease Mother   . Stomach cancer Father 82       died 73  .  Bradycardia Brother        has pacemaker  . Heart failure Paternal Grandmother   . Heart failure Paternal Grandfather     Social History   Socioeconomic History  . Marital status: Married    Spouse name: Not on file  . Number of children: 0  . Years of education: Not on file  . Highest education level: Not on file  Occupational History  . Not on file  Social Needs  . Financial resource strain: Not on file  . Food insecurity    Worry: Not on file    Inability: Not on file  . Transportation needs    Medical: Not on file    Non-medical: Not on file  Tobacco Use  . Smoking status: Former Smoker    Packs/day: 1.00    Years: 24.00    Pack years: 24.00    Types: Cigarettes    Start date: 10/29/1958    Quit date: 12/02/1985    Years since quitting: 33.1  . Smokeless tobacco: Never Used  Substance and Sexual Activity  . Alcohol use: No    Alcohol/week: 0.0 standard drinks  . Drug use: Never  . Sexual activity: Not on file  Lifestyle  . Physical activity    Days per week: Not on  file    Minutes per session: Not on file  . Stress: Not on file  Relationships  . Social Herbalist on phone: Not on file    Gets together: Not on file    Attends religious service: Not on file    Active member of club or organization: Not on file    Attends meetings of clubs or organizations: Not on file    Relationship status: Not on file  Other Topics Concern  . Not on file  Social History Narrative  . Not on file     Review of Systems: A 12 point ROS discussed and pertinent positives are indicated in the HPI above.  All other systems are negative.  Review of Systems  Constitutional: Negative for chills and fever.  HENT: Negative for nosebleeds.   Respiratory: Positive for shortness of breath.   Cardiovascular: Positive for leg swelling. Negative for chest pain.  Gastrointestinal: Positive for nausea (sts due to being hungry). Negative for abdominal pain, blood in stool, diarrhea and vomiting.  Musculoskeletal: Negative for back pain.  Skin: Negative for rash and wound.  Neurological: Negative for dizziness and headaches.    Vital Signs: BP 126/75 (BP Location: Right Arm)   Pulse 88   Temp 97.7 F (36.5 C) (Oral)   Resp 18   Ht 5\' 4"  (1.626 m)   Wt 215 lb 6.2 oz (97.7 kg)   SpO2 90%   BMI 36.97 kg/m   Physical Exam Vitals signs and nursing note reviewed.  Constitutional:      General: She is not in acute distress. HENT:     Mouth/Throat:     Mouth: Mucous membranes are moist.     Pharynx: Oropharynx is clear. No oropharyngeal exudate or posterior oropharyngeal erythema.  Cardiovascular:     Rate and Rhythm: Normal rate and regular rhythm.  Pulmonary:     Effort: Pulmonary effort is normal.     Comments: Diminished breath sounds bilaterally Abdominal:     General: There is no distension.     Palpations: Abdomen is soft.     Tenderness: There is no abdominal tenderness.  Skin:    General: Skin is dry.  Neurological:     Mental Status: She is  alert and oriented to person, place, and time.  Psychiatric:        Mood and Affect: Mood normal.        Behavior: Behavior normal.        Thought Content: Thought content normal.        Judgment: Judgment normal.      MD Evaluation Airway: WNL Heart: WNL Abdomen: WNL Chest/ Lungs: WNL ASA  Classification: 2 Mallampati/Airway Score: Two   Imaging: Dg Chest 2 View  Result Date: 01/27/2019 CLINICAL DATA:  Shortness of breath, history of CHF. EXAM: CHEST - 2 VIEW COMPARISON:  09/20/2017 FINDINGS: Heart size is markedly enlarged as before. Pulmonary vascular congestion is noted without frank edema or signs of pleural effusion. No signs of consolidation. Left-sided dual lead pacer device in situ as before. No acute bone finding. Signs of calcified atherosclerosis. IMPRESSION: Cardiomegaly and pulmonary vascular congestion. No frank edema or pleural effusions. Electronically Signed   By: Zetta Bills M.D.   On: 01/27/2019 12:06    Labs:  CBC: Recent Labs    12/25/18 1134 01/27/19 1233 01/27/19 1652 01/28/19 0237  WBC 4.7 6.3 5.5 5.2  HGB 9.9* 10.5* 10.1* 9.5*  HCT 29.6* 33.9* 32.5* 30.4*  PLT 151 253 234 221    COAGS: Recent Labs    01/27/19 1233  INR 2.5*    BMP: Recent Labs    12/25/18 1134 01/27/19 1233 01/27/19 1652 01/28/19 0237 01/29/19 0406  NA 138 138  --  140 142  K 3.9 3.7  --  3.4* 2.9*  CL 95* 94*  --  96* 95*  CO2 23 28  --  27 30  GLUCOSE 119* 122*  --  103* 105*  BUN 99* 126*  --  126* 124*  CALCIUM 8.7 9.4  --  9.3 9.0  CREATININE 3.60* 4.50* 4.36* 4.55* 4.25*  GFRNONAA 11* 9* 9* 9* 9*  GFRAA 13* 10* 11* 10* 11*    LIVER FUNCTION TESTS: Recent Labs    08/31/18 1450 11/06/18 1502 01/27/19 1652 01/28/19 0237 01/29/19 0406  BILITOT 0.9 1.1 1.7* 1.6*  --   AST 16 16 47* 41  --   ALT 16 18 103* 99*  --   ALKPHOS 45 46 69 66  --   PROT 6.7 6.8 6.4* 5.7*  --   ALBUMIN 3.8 4.0 3.5 3.3* 3.2*    TUMOR MARKERS: No results for  input(s): AFPTM, CEA, CA199, CHROMGRNA in the last 8760 hours.  Assessment and Plan:  78 y/o F with history of CHF s/p [facility] PPM, a.fib on Eliquis and CKD V who presented to [name] 11/25 due to worsening dyspnea. Nephrology was consulted and they have recommended that patient begin HD during this admission. IR has been asked to place a tunneled HD catheter.  Due to patient's current use of Eliquis and IR schedule will tentatively plan for [facility] placement Monday 11/30 with moderate sedation. Discussed with Dr. Royce [name] who is agreeable to wait for [facility] placement vs temp HD catheter placement today. Patient to be NPO after midnight 11/30, AM labs ordered, continue to hold Eliquis until post procedure, heparin/lovenox will need to be held beginning Sunday (if applicable).  Risks and benefits discussed with the patient including, but not limited to bleeding, infection, vascular injury, pneumothorax which may require chest tube placement, air embolism or even death.  All of the patient's questions were answered, patient is agreeable to proceed.  Consent signed and in IR control room.   Thank you for this interesting consult.  I greatly enjoyed meeting Gina [name] and look forward to participating in their care.  A copy of this report was sent to the requesting provider on this date.  Electronically Signed: Joaquim Nam, PA-C 01/29/2019, 11:40 AM   I spent a total of 20 Minutes  in face to face in clinical consultation, greater than 50% of which was counseling/coordinating care for tunneled HD catheter placement.

## 2019-01-30 ENCOUNTER — Inpatient Hospital Stay (HOSPITAL_COMMUNITY): Payer: Medicare Other

## 2019-01-30 LAB — RENAL FUNCTION PANEL
Albumin: 3.1 g/dL — ABNORMAL LOW (ref 3.5–5.0)
Anion gap: 16 — ABNORMAL HIGH (ref 5–15)
BUN: 120 mg/dL — ABNORMAL HIGH (ref 8–23)
CO2: 29 mmol/L (ref 22–32)
Calcium: 8.7 mg/dL — ABNORMAL LOW (ref 8.9–10.3)
Chloride: 97 mmol/L — ABNORMAL LOW (ref 98–111)
Creatinine, Ser: 4.08 mg/dL — ABNORMAL HIGH (ref 0.44–1.00)
GFR calc Af Amer: 11 mL/min — ABNORMAL LOW (ref >60)
GFR calc non Af Amer: 10 mL/min — ABNORMAL LOW (ref >60)
Glucose, Bld: 102 mg/dL — ABNORMAL HIGH (ref 70–99)
Phosphorus: 4.1 mg/dL (ref 2.5–4.6)
Potassium: 3.9 mmol/L (ref 3.5–5.1)
Sodium: 142 mmol/L (ref 135–145)

## 2019-01-30 LAB — BASIC METABOLIC PANEL
Anion gap: 14 (ref 5–15)
BUN: 115 mg/dL — ABNORMAL HIGH (ref 8–23)
CO2: 30 mmol/L (ref 22–32)
Calcium: 8.3 mg/dL — ABNORMAL LOW (ref 8.9–10.3)
Chloride: 95 mmol/L — ABNORMAL LOW (ref 98–111)
Creatinine, Ser: 3.74 mg/dL — ABNORMAL HIGH (ref 0.44–1.00)
GFR calc Af Amer: 13 mL/min — ABNORMAL LOW (ref >60)
GFR calc non Af Amer: 11 mL/min — ABNORMAL LOW (ref >60)
Glucose, Bld: 146 mg/dL — ABNORMAL HIGH (ref 70–99)
Potassium: 3.5 mmol/L (ref 3.5–5.1)
Sodium: 139 mmol/L (ref 135–145)

## 2019-01-30 LAB — MAGNESIUM: Magnesium: 2.3 mg/dL (ref 1.7–2.4)

## 2019-01-30 MED ORDER — DOCUSATE SODIUM 100 MG PO CAPS
200.0000 mg | ORAL_CAPSULE | Freq: Every day | ORAL | Status: DC
  Administered 2019-01-30 – 2019-02-02 (×4): 200 mg via ORAL
  Filled 2019-01-30 (×5): qty 2

## 2019-01-30 MED ORDER — FUROSEMIDE 10 MG/ML IJ SOLN
100.0000 mg | Freq: Two times a day (BID) | INTRAVENOUS | Status: DC
  Administered 2019-01-30 (×2): 100 mg via INTRAVENOUS
  Filled 2019-01-30 (×4): qty 10

## 2019-01-30 NOTE — Progress Notes (Signed)
  Stable from cardiac standpoint. Volume management per Nephrology.  We will continue to follow along.   Plan for [facility] on 11/30. Agree that placing LV lead prior to initiation of HD likely not to impact disease course.   Glori Bickers, MD  9:57 AM

## 2019-01-30 NOTE — Progress Notes (Signed)
Placed pt on CPAP and tolerating well

## 2019-01-30 NOTE — Progress Notes (Signed)
PROGRESS NOTE  Gina [name] [id number] DOB: Nov 27, 1940 DOA: 01/27/2019 PCP: Glenda Chroman, MD  Brief History   78 y.o.femalewith medical history significant ofchronic systolic congestive heart failure due to nonischemic cardiomyopathy with ejection fraction of 20%, left bundle branch block, stage V CKD, A. fib status post AV nodal ablation with [facility] PPM in 2019-on Eliquis, anemia of chronic disease, hypertension, renal artery stenosis, obstructive sleep apnea on CPAP presents to emergency department due to worsening shortness of breath since 3 weeks.  Patient tells me that she went to heart failure clinic this morning-due to her symptoms she was brought to the emergency department for further evaluation and management. Patient reports exertional shortness of breath even with few steps, abdominal distention and weight gain. She has been compliant with her diuretics and low-sodium diet. No history of smoking, alcohol, illicit drug use. She reports 20 pound weight gain, water retention mostly in the abdomen.  She has stage V kidney disease and she is nearing hemodialysis. She is awaiting consultation with vascular surgeon for placement of HD access. She reports that she still makes urine.  Recently diagnosed with hypothyroidism and takes levothyroxine as prescribed. She has obstructive sleep apnea and she uses CPAP and 2 L of oxygen at bedtime only.  She denies chest pain, palpitation, leg swelling, orthopnea, PND, nausea, vomiting, abdominal pain, diarrhea, headache, blurry vision, lightheadedness or dizziness.  ED Course:Upon arrival: Patient respiratory rate in 20s, on 2 L of oxygen via nasal cannula, chest x-ray shows cardiomegaly with pulmonary vascular congestion, proBNP elevated more than 4500, troponin elevated at 61, COVID-19 pending. EDP consulted cardiology for further evaluation and management.  The patient is being diuresed with IV Lasix 120 mg bid. Nephrology  has also been consulted as the patient is nearing initiation of dialysis. Her creatinine is elevated from 3.6 last month to 4.5 this admission.  They plan to initiate HD.  Plan is for tunnelled catheter on 02/01/2019.  Consultants  . Heart failure . Nephrology  Procedures  . None  Antibiotics   Anti-infectives (From admission, onward)   None     Subjective  The patient is resting comfortably. No new complaints.   Objective   Vitals:  Vitals:   01/30/19 0722 01/30/19 1301  BP: 113/64 113/62  Pulse: 73 75  Resp: 18   Temp: 97.8 F (36.6 C) (!) 97.3 F (36.3 C)  SpO2: 92% 95%   Exam:  Constitutional:  . The patient is awake, alert, and oriented x 3. No acute distress. Respiratory:  . No increased work of breathing. . No wheezes, rales, or rhonchi . No tactile fremitus Cardiovascular:  . Regular rate and rhythm . No murmurs, ectopy, or gallups. . No lateral PMI. No thrills. Abdomen:  . Abdomen is soft, non-tender, non-distended . No hernias, masses, or organomegaly . Normoactive bowel sounds.  Musculoskeletal:  . No cyanosis, clubbing, or edema Skin:  . No rashes, lesions, ulcers . palpation of skin: no induration or nodules Neurologic:  . CN 2-12 intact . Sensation all 4 extremities intact Psychiatric:  . Mental status o Mood, affect appropriate o Orientation to person, place, time  . judgment and insight appear intact  I have personally reviewed the following:   Today's Data  . BMP, Vitals  Scheduled Meds: . amiodarone  200 mg Oral Daily  . carvedilol  12.5 mg Oral BID  . Chlorhexidine Gluconate Cloth  6 each Topical Q0600  . ferrous sulfate  325 mg Oral Q breakfast  .  levothyroxine  75 mcg Oral QAC breakfast  . sodium chloride flush  3 mL Intravenous Once   Continuous Infusions: . furosemide 100 mg (01/30/19 1130)    Principal Problem:   Acute on chronic congestive heart failure (HCC) Active Problems:   Hypertension   Mitral  regurgitation   Anemia of chronic disease   OSA (obstructive sleep apnea)   Atrial fibrillation with RVR (HCC)   Pacemaker   Hypothyroidism   CKD (chronic kidney disease) stage 5, GFR less than 15 ml/min (HCC)   PVC (premature ventricular contraction)   Pressure injury of skin   LOS: 3 days   A & P  Acute on chronic congestive heart failure: Cardiology consulted. Patient is being diuresed with lasix 80 mg IV daily. Most recent echocardiogram was in 08/2018 and demonstrated EF of 20% mildly dilated left ventricle with diffuse hypokinesis. There is mildly reduced right ventricular function. Fluid balance is currently negative 4.5 liters. Volume status is being monitored closely as is renal function and electrolytes.   Stage V chronic kidney disease stage end-stage renal disease: Creatinine 4.25 today. Monitor electrolytes, creatinine, and volume status carefully. Avoid nephrotoxins and hypotension. Nephrology has been consulted and is planning initiation of HD. She has recently been evaluated by vascular surgery for HD access. She will have a tunnelled catheter placed on 02/01/2019.  Hypokalemia: Resolved. Monitor.  A. fib with [id number]. Heart rate is controlled. She is on Eliquis 5 mg bid. She is s/p AV nodal ablation with [place] PPM. She is being monitored on telemetry and cardiology has been consulted.  Anemia of chronic disease with mild iron deficiency: Noted and stable. Likely from chronic kidney disease. Monitor H&H and continue ferrous sulfate.  Hypothyroidism: Continue levothyroxine as outpatient.  PVCs: Continue amiodarone as at home. Monitor electrolytes.  Obstructive sleep apnea: On CPAP at home, will continue same.  I have seen and examined this patient myself. I have spent 32 minutes in her evaluation and care.  DVT prophylaxis: TED/SCD/eliquis Code Status: Full code Family Communication: None available. Disposition Plan: TBD   Ibraheem Voris, DO [facility] Direct contact: see [email or web address]  7PM-7AM contact night coverage as above 01/29/2019, 2:24 PM  LOS: 2 days

## 2019-01-30 NOTE — Progress Notes (Signed)
[facility] Progress Note  Name: Gina [name] MRN: [id number] DOB: Jun 08, 1940  Chief Complaint:  Shortness of breath   Subjective:  She had 2.6 liters UOP over 11/27 with IV lasix.  Not able to get tunneled catheter given eliquis - nontunneled was deferred in favor of tunneled on 11/30 with IR.  She still does want to proceed with HD.  Lives near [place] in [place].   Review of systems: She has had some shortness of breath this morning - better now with oxygen  Denies n/v Appetite ok; taste intact No chest pain  ---------------- Background on consult:  Gina [name] is an 78 y.o. female.  HPI: Gina [name] has an extensive PMH significant for atrial fibrillation s/p AV nodal ablation with [facility] Jude PPM on eliquis, HTN, OSA on CPAP, chronic CHF due to nonischemic CMP (EF 20%), LBBB, and CKD stage 4-5 who presented with 3 week history of worsening SOB, DOE, 20 lbs weight gain, and orthopnea.  She also has a recent diagnosis of hypothyroidism with recent increase in replacement therapy.  She has had increasing doses of torsemide per heart failure clinic prior to admission, however given her progressive symptoms she was sent to the ED.  We were consulted to help further evaluate and manage her AKI/CKD vs progressive CKD.  She is followed by Nephrology in [place], [place] and was referred for access placement due to her progressive CKD.  She has not seen the vascular surgeons yet but was aware of her worsening renal function.   She does have some nausea, anorexia, and fatigue but denies dysgeusia, increased somnolence, or myoclonic jerking.       Intake/Output Summary (Last 24 hours) at 01/30/2019 0758 Last data filed at 01/30/2019 0500 Gross per 24 hour  Intake 600 ml  Output 2600 ml  Net -2000 ml    Vitals:  Vitals:   01/29/19 0833 01/29/19 2120 01/30/19 0451 01/30/19 0722  BP: 126/75 95/80 102/66 113/64  Pulse: 88 73 72 73  Resp:  20 19 18   Temp: 97.7 F (36.5  C) (!) 97.1 F (36.2 C) 97.8 F (36.6 C) 97.8 F (36.6 C)  TempSrc: Oral Oral Oral Oral  SpO2: 90% 100% 98% 92%  Weight:   98.8 kg   Height:         Physical Exam:  General adult female in bed in no acute distress HEENT normocephalic atraumatic extraocular movements intact sclera anicteric Neck supple trachea midline Lungs clear to auscultation; increased work of breathing with exertion  Heart regular rate and rhythm no rubs Abdomen soft nontender distended obese habitus Extremities no edema dependent thighs Psych normal mood and affect Neuro - alert and oriented x 3   Medications reviewed   Labs:  BMP Latest Ref Rng & Units 01/30/2019 01/29/2019 01/29/2019  Glucose 70 - 99 mg/dL 102(H) - 105(H)  BUN 8 - 23 mg/dL 120(H) - 124(H)  Creatinine 0.44 - 1.00 mg/dL 4.08(H) - 4.25(H)  BUN/Creat Ratio 12 - 28 - - -  Sodium 135 - 145 mmol/L 142 - 142  Potassium 3.5 - 5.1 mmol/L 3.9 3.0(L) 2.9(L)  Chloride 98 - 111 mmol/L 97(L) - 95(L)  CO2 22 - 32 mmol/L 29 - 30  Calcium 8.9 - 10.3 mg/dL 8.7(L) - 9.0     Assessment/Plan:   1.  AKI/CKD stage 5 vs progressive CKD due to cardiorenal syndrome-  She has some mild uremic symptoms and failed oral diuretics.  No urgent indication for dialysis, however we  did discuss the potential need for dialysis if she does not respond to IV lasix.  In past had a failed attempt to convert to biventricular pacer on 12/29/18 due to an occluded left subclavian vein - she had hoped to have procedure on pacer but with overload refractory to oral diuretics and BUN over 126 on presentation appears has progressed to ESRD.  Spoke with Dr. Lovena Le 11/26 and given that dialysis is imminent he would like to wait until she has a mature fistula in place without a tunneled catheter still in use prior to any procedure on her pacer.  Infection risk would be higher with a catheter in place.  He is ok with holding eliquis for access placement.  - Recommend initiation of  dialysis after tunneled catheter placement on 11/30 - hold eliquis for now for access placement  - Will need to [place] - lives near [place] in [place].  - NPO after 12:01 am on 11/30 for access - Lasix IV x 2 today  - She will need to reschedule appt with vascular locally in [place] (was for next week) if she misses due to hospitalization   2. Acute on chronic CHF  1. Continue lasix until HD initiated then follow for needs 2. Continue to follow strict I's/O's and daily weights.  3. Anemia of CKD stage 5- iron is replete.  aranesp - 40 mcg on 11/26   4. A fib with AV nodal ablation- per cardiology 5. Hypothyroidism- cont with replacement therapy 6. OSA- on CPAP 7. PVC's- per Cardiology 8. Hypokalemia - replete K; 3K bath for HD likely     Claudia Desanctis, MD 01/30/2019 7:58 AM

## 2019-01-31 DIAGNOSIS — M25562 Pain in left knee: Secondary | ICD-10-CM

## 2019-01-31 DIAGNOSIS — I509 Heart failure, unspecified: Secondary | ICD-10-CM

## 2019-01-31 LAB — RENAL FUNCTION PANEL
Albumin: 3 g/dL — ABNORMAL LOW (ref 3.5–5.0)
Anion gap: 14 (ref 5–15)
BUN: 113 mg/dL — ABNORMAL HIGH (ref 8–23)
CO2: 32 mmol/L (ref 22–32)
Calcium: 8.4 mg/dL — ABNORMAL LOW (ref 8.9–10.3)
Chloride: 95 mmol/L — ABNORMAL LOW (ref 98–111)
Creatinine, Ser: 3.56 mg/dL — ABNORMAL HIGH (ref 0.44–1.00)
GFR calc Af Amer: 13 mL/min — ABNORMAL LOW (ref >60)
GFR calc non Af Amer: 12 mL/min — ABNORMAL LOW (ref >60)
Glucose, Bld: 91 mg/dL (ref 70–99)
Phosphorus: 4.2 mg/dL (ref 2.5–4.6)
Potassium: 3.2 mmol/L — ABNORMAL LOW (ref 3.5–5.1)
Sodium: 141 mmol/L (ref 135–145)

## 2019-01-31 LAB — CBC WITH DIFFERENTIAL/PLATELET
Abs Immature Granulocytes: 0.03 10*3/uL (ref 0.00–0.07)
Basophils Absolute: 0 10*3/uL (ref 0.0–0.1)
Basophils Relative: 0 %
Eosinophils Absolute: 0.2 10*3/uL (ref 0.0–0.5)
Eosinophils Relative: 3 %
HCT: 32.7 % — ABNORMAL LOW (ref 36.0–46.0)
Hemoglobin: 10.2 g/dL — ABNORMAL LOW (ref 12.0–15.0)
Immature Granulocytes: 1 %
Lymphocytes Relative: 8 %
Lymphs Abs: 0.4 10*3/uL — ABNORMAL LOW (ref 0.7–4.0)
MCH: 33.8 pg (ref 26.0–34.0)
MCHC: 31.2 g/dL (ref 30.0–36.0)
MCV: 108.3 fL — ABNORMAL HIGH (ref 80.0–100.0)
Monocytes Absolute: 0.7 10*3/uL (ref 0.1–1.0)
Monocytes Relative: 12 %
Neutro Abs: 4.5 10*3/uL (ref 1.7–7.7)
Neutrophils Relative %: 76 %
Platelets: 184 10*3/uL (ref 150–400)
RBC: 3.02 MIL/uL — ABNORMAL LOW (ref 3.87–5.11)
RDW: 16.9 % — ABNORMAL HIGH (ref 11.5–15.5)
WBC: 5.8 10*3/uL (ref 4.0–10.5)
nRBC: 0 % (ref 0.0–0.2)

## 2019-01-31 LAB — POTASSIUM: Potassium: 4.2 mmol/L (ref 3.5–5.1)

## 2019-01-31 LAB — MAGNESIUM: Magnesium: 2.2 mg/dL (ref 1.7–2.4)

## 2019-01-31 MED ORDER — POTASSIUM CHLORIDE CRYS ER 20 MEQ PO TBCR
40.0000 meq | EXTENDED_RELEASE_TABLET | Freq: Once | ORAL | Status: AC
  Administered 2019-01-31: 40 meq via ORAL
  Filled 2019-01-31: qty 2

## 2019-01-31 MED ORDER — CHLORHEXIDINE GLUCONATE CLOTH 2 % EX PADS
6.0000 | MEDICATED_PAD | Freq: Every day | CUTANEOUS | Status: DC
  Administered 2019-02-01 – 2019-02-06 (×5): 6 via TOPICAL

## 2019-01-31 MED ORDER — FUROSEMIDE 10 MG/ML IJ SOLN
100.0000 mg | Freq: Two times a day (BID) | INTRAVENOUS | Status: AC
  Administered 2019-01-31 – 2019-02-01 (×2): 100 mg via INTRAVENOUS
  Filled 2019-01-31 (×2): qty 10

## 2019-01-31 MED ORDER — BISACODYL 10 MG RE SUPP
10.0000 mg | Freq: Once | RECTAL | Status: AC
  Administered 2019-01-31: 10 mg via RECTAL
  Filled 2019-01-31: qty 1

## 2019-01-31 NOTE — Progress Notes (Signed)
[facility] notified that patient last BM was 11/25 and patient is requesting suppository. Arthor Captain LPN

## 2019-01-31 NOTE — Consult Note (Signed)
[facility] Consultation  Reason for Consult: left knee pain Referring Physician: Dr. Karie Kirks  Gina [name] is an 78 y.o. female.  HPI: Renal failure and CHF in for dialysis catheter placement and management of shortness of breath. Has noticed some incremental increase in medial left knee pain without prior history of trauma or recent infection. Denies fever or chills. S/p constrained left TKA by Dr. Kizzie Furnish in [place], [place] in 2013 or 2014. Has not seen him recently.  Past Medical History:  Diagnosis Date  . Acute renal failure [facility])     hospitalized... December 08, 2009... improved with hydration in the hospital  . Anemia    further workup needed... October, 2011.  [name] Aortic insufficiency    mild...echo... June 2 010  . Arthritis    Hips/Knees, severe, requiring a walker  . Asymmetric septal hypertrophy (HCC)    echo....04/2009....patient encouraged the family to be screened elsewhere  . Atrial fibrillation ([place])     ??? atrial fibrillation during hospitalization October, 2011 ???..  . Bradycardia   . CAD (coronary artery disease)    Mild coronary disease, catheterization, 2009  . Cardiomyopathy, nonischemic ([place])    EF improved October, 2011  . CHF (congestive heart failure) ([place])   . Ejection fraction    EF 25%...nondiagnostic MRI December 2009...  /  echo June, 2010  /   echo... December 08, 2009.... ejection fraction improved... EF 60% /  EF 30%... echo.... [place]... December 28, 2009 with CHFPLanned ICD / CRT... patient has seen Dr.Klein..EF improved October, 2011   . Groin hematoma    Right-hematoma/abscess..repaired.. December, 2009  . Hypertension   . LBBB (left bundle branch block)   . LVH (left ventricular hypertrophy)    moderately severe... echo.. June, 2010  /  EF improved October, 2011.... cancel plans for ICD  . Mitral regurgitation    mild/ moderate...echo June, 2010  /   echo.. October, 2011.... mitral regurgitation  improved  . OSA  (obstructive sleep apnea)   . Presence of permanent cardiac pacemaker   . Renal artery stenosis (HCC)    80% upper left   . Systolic heart failure    chronic    Past Surgical History:  Procedure Laterality Date  . ABDOMINAL HYSTERECTOMY    . AV NODE ABLATION N/A 09/19/2017   Procedure: AV NODE ABLATION;  Surgeon: Deboraha Sprang, MD;  Location: [facility];  Service: Cardiovascular;  Laterality: N/A;  . BIV PACEMAKER INSERTION CRT-P N/A 09/19/2017   Procedure: BIV PACEMAKER INSERTION CRT-P;  Surgeon: Deboraha Sprang, MD;  Location: [facility];  Service: Cardiovascular;  Laterality: N/A;  . BIV PACEMAKER INSERTION CRT-P N/A 12/28/2018   Procedure: Upgrade to BIV PACEMAKER INSERTION CRT-P;  Surgeon: Evans Lance, MD;  Location: [facility];  Service: Cardiovascular;  Laterality: N/A;  . CARDIAC CATHETERIZATION N/A 08/14/2015   Procedure: Right/Left Heart Cath and Coronary Angiography;  Surgeon: Leonie Man, MD;  Location: [facility];  Service: Cardiovascular;  Laterality: N/A;  . CARDIOVERSION N/A 09/11/2017   Procedure: CARDIOVERSION;  Surgeon: Larey Dresser, MD;  Location: [facility] ENDOSCOPY;  Service: Cardiovascular;  Laterality: N/A;  . CARDIOVERSION N/A 09/14/2017   Procedure: CARDIOVERSION;  Surgeon: Larey Dresser, MD;  Location: [facility];  Service: Cardiovascular;  Laterality: N/A;  . CARDIOVERSION N/A 09/18/2017   Procedure: CARDIOVERSION FOR ATRIAL FIBRILLATION;  Surgeon: Larey Dresser, MD;  Location: [facility];  Service: Cardiovascular;  Laterality: N/A;  . REPLACEMENT TOTAL HIP W/  RESURFACING IMPLANTS    . REPLACEMENT TOTAL KNEE    . Surgical Repair of a Catheteriztion associated femoral artery injury      Family History  Problem Relation Age of Onset  . Rheumatic fever Mother        in her early 65's  . Alzheimer's disease Mother   . Stomach cancer Father 23       died 86  . Bradycardia Brother        has pacemaker  . Heart failure  Paternal Grandmother   . Heart failure Paternal Grandfather     Social History:  reports that she quit smoking about 33 years ago. Her smoking use included cigarettes. She started smoking about 60 years ago. She has a 24.00 pack-year smoking history. She has never used smokeless tobacco. She reports that she does not drink alcohol or use drugs.  Allergies:  Allergies  Allergen Reactions  . Bactrim [Sulfamethoxazole-Trimethoprim] Other (See Comments)    Flu like symptoms  . Cephalexin Rash    REACTION: Rash to arms/legs    Medications:  Prior to Admission:  Medications Prior to Admission  Medication Sig Dispense Refill Last Dose  . acetaminophen (TYLENOL) 325 MG tablet Take 2 tablets (650 mg total) by mouth every 4 (four) hours as needed for headache or mild pain. (Patient taking differently: Take 650 mg by mouth at bedtime as needed (sleep). )   01/26/2019 at Unknown time  . amiodarone (PACERONE) 200 MG tablet TAKE 1 TABLET(200 MG) BY MOUTH DAILY (Patient taking differently: Take 200 mg by mouth daily. ) 60 tablet 3 01/27/2019 at Unknown time  . carvedilol (COREG) 12.5 MG tablet Take 1 tablet (12.5 mg total) by mouth 2 (two) times daily. 90 tablet 3 01/27/2019 at 1130  . ELIQUIS 5 MG TABS tablet TAKE 1 TABLET(5 MG) BY MOUTH TWICE DAILY (Patient taking differently: Take 5 mg by mouth 2 (two) times daily. ) 60 tablet 3 01/27/2019 at 1130  . ferrous sulfate 325 (65 FE) MG tablet Take 325 mg by mouth daily with breakfast.   01/27/2019 at Unknown time  . levothyroxine (SYNTHROID) 75 MCG tablet Take 75 mcg by mouth daily before breakfast.   01/27/2019 at Unknown time  . metolazone (ZAROXOLYN) 2.5 MG tablet TAKE 1 TABLET BY MOUTH ONCE WEEKLY ON WEDNESDAY MORNINGS (Patient taking differently: Take 2.5 mg by mouth once a week. Wednesdays) 12 tablet 1 01/25/2019  . Multiple Vitamin (MULTIVITAMIN) tablet Take 1 tablet by mouth daily.   01/27/2019 at Unknown time  . Multiple Vitamins-Minerals  (PRESERVISION AREDS 2+MULTI VIT PO) Take 1 capsule by mouth daily.   01/27/2019 at Unknown time  . potassium chloride SA (K-DUR,KLOR-CON) 20 MEQ tablet Take 1 tablet (20 mEq total) by mouth daily. 90 tablet 3 01/27/2019 at Unknown time  . torsemide (DEMADEX) 20 MG tablet Take 2 tablets (40 mg total) by mouth daily. 180 tablet 2 01/27/2019 at Unknown time    Results for orders placed or performed during the hospital encounter of 01/27/19 (from the past 48 hour(s))  Renal function panel     Status: Abnormal   Collection Time: 01/30/19  4:48 AM  Result Value Ref Range   Sodium 142 135 - 145 mmol/L   Potassium 3.9 3.5 - 5.1 mmol/L   Chloride 97 (L) 98 - 111 mmol/L   CO2 29 22 - 32 mmol/L   Glucose, Bld 102 (H) 70 - 99 mg/dL   BUN  120 (H) 8 - 23 mg/dL   Creatinine, Ser 4.08 (H) 0.44 - 1.00 mg/dL   Calcium 8.7 (L) 8.9 - 10.3 mg/dL   Phosphorus 4.1 2.5 - 4.6 mg/dL   Albumin 3.1 (L) 3.5 - 5.0 g/dL   GFR calc non Af Amer 10 (L) >60 mL/min   GFR calc Af Amer 11 (L) >60 mL/min   Anion gap 16 (H) 5 - 15    Comment: Performed at [facility] [address], [place], [place] [address]  Magnesium     Status: None   Collection Time: 01/30/19  4:48 AM  Result Value Ref Range   Magnesium 2.3 1.7 - 2.4 mg/dL    Comment: Performed at [facility] [address], [place], [place] [id number]  Basic metabolic panel     Status: Abnormal   Collection Time: 01/30/19  6:28 PM  Result Value Ref Range   Sodium 139 135 - 145 mmol/L   Potassium 3.5 3.5 - 5.1 mmol/L   Chloride 95 (L) 98 - 111 mmol/L   CO2 30 22 - 32 mmol/L   Glucose, Bld 146 (H) 70 - 99 mg/dL   BUN 115 (H) 8 - 23 mg/dL   Creatinine, Ser 3.74 (H) 0.44 - 1.00 mg/dL   Calcium 8.3 (L) 8.9 - 10.3 mg/dL   GFR calc non Af Amer 11 (L) >60 mL/min   GFR calc Af Amer 13 (L) >60 mL/min   Anion gap 14 5 - 15    Comment: Performed at [facility] [address], [place], [place] [address]  Magnesium     Status: None   Collection  Time: 01/31/19  4:41 AM  Result Value Ref Range   Magnesium 2.2 1.7 - 2.4 mg/dL    Comment: Performed at [facility] [address], [place], [place] [address]  CBC with Differential/Platelet     Status: Abnormal   Collection Time: 01/31/19  4:41 AM  Result Value Ref Range   WBC 5.8 4.0 - 10.5 K/uL   RBC 3.02 (L) 3.87 - 5.11 MIL/uL   Hemoglobin 10.2 (L) 12.0 - 15.0 g/dL   HCT 32.7 (L) 36.0 - 46.0 %   MCV 108.3 (H) 80.0 - 100.0 fL   MCH 33.8 26.0 - 34.0 pg   MCHC 31.2 30.0 - 36.0 g/dL   RDW 16.9 (H) 11.5 - 15.5 %   Platelets 184 150 - 400 K/uL   nRBC 0.0 0.0 - 0.2 %   Neutrophils Relative % 76 %   Neutro Abs 4.5 1.7 - 7.7 K/uL   Lymphocytes Relative 8 %   Lymphs Abs 0.4 (L) 0.7 - 4.0 K/uL   Monocytes Relative 12 %   Monocytes Absolute 0.7 0.1 - 1.0 K/uL   Eosinophils Relative 3 %   Eosinophils Absolute 0.2 0.0 - 0.5 K/uL   Basophils Relative 0 %   Basophils Absolute 0.0 0.0 - 0.1 K/uL   Immature Granulocytes 1 %   Abs Immature Granulocytes 0.03 0.00 - 0.07 K/uL    Comment: Performed at [facility], [place] [address] [place], [place] [address]  Renal function panel     Status: Abnormal   Collection Time: 01/31/19  4:41 AM  Result Value Ref Range   Sodium 141 135 - 145 mmol/L   Potassium 3.2 (L) 3.5 - 5.1 mmol/L   Chloride 95 (L) 98 - 111 mmol/L   CO2 32 22 - 32 mmol/L   Glucose,  Bld 91 70 - 99 mg/dL   BUN 113 (H) 8 - 23 mg/dL   Creatinine, Ser 3.56 (H) 0.44 - 1.00 mg/dL   Calcium 8.4 (L) 8.9 - 10.3 mg/dL   Phosphorus 4.2 2.5 - 4.6 mg/dL   Albumin 3.0 (L) 3.5 - 5.0 g/dL   GFR calc non Af Amer 12 (L) >60 mL/min   GFR calc Af Amer 13 (L) >60 mL/min   Anion gap 14 5 - 15    Comment: Performed at [facility] [address] [place], [place] [address]  Potassium     Status: None   Collection Time: 01/31/19  1:40 PM  Result Value Ref Range   Potassium 4.2 3.5 - 5.1 mmol/L    Comment: SLIGHT HEMOLYSIS Performed at [facility] [address], [place], [place] [address]     Dg Knee 1-2 Views Left  Result Date: 01/30/2019 CLINICAL DATA:  Left knee pain, swelling.  Remote replacement EXAM: LEFT KNEE - 1-2 VIEW COMPARISON:  None. FINDINGS: Changes of left knee replacement. No hardware complicating feature. No acute bony abnormality. Specifically, no fracture, subluxation, or dislocation. No joint effusion. IMPRESSION: Left knee replacement. No complicating feature. No acute bony abnormality. Electronically Signed   By: Rolm Baptise M.D.   On: 01/30/2019 19:25    ROS multiple as above but no increasing redness, fever, or other infection Blood pressure 100/64, pulse 77, temperature 98.1 F (36.7 C), temperature source Oral, resp. rate 18, height 5\' 4"  (1.626 m), weight 96.6 kg, SpO2 100 %. Physical Exam Alert and very pleasant LLE No traumatic wounds, ecchymosis, erythema, or rash; well healed midline incision  Tender medial joint line  No knee or ankle effusion  Knee stable to varus/ valgus and anterior/posterior stress and no pain with ROM but some mild discomfort with axial loading  Sens DPN, SPN, TN intact  Motor EHL, ext, flex, evers intact grossly, foot deformity  No significant edema, warm with brisk CR  Assessment/Plan: Suspect soft tissue inflammation or catching between components medially No evidence of infection  After history, examination, review of labs and x-rays, I have recommended the following: 1. Apply ice to left knee regularly 2. Topical Aspercreme or other NDSAID would not be harmful and patient wishes to try; she is starting this tomorrow 3. Ok to mobilize with PT WBAT; f/u with Dr. Benjaman Kindler within 2-4 weeks of discharge if symptoms persist.  I spent 50 minutes in direct face to face time with the patient.   [place], MD [facility], [facility] [phone]  01/31/2019  5:30 PM

## 2019-01-31 NOTE — Progress Notes (Signed)
PROGRESS NOTE  Gina [name] [id number] DOB: 29-Apr-1940 DOA: 01/27/2019 PCP: Glenda Chroman, MD  Brief History   78 y.o.femalewith medical history significant ofchronic systolic congestive heart failure due to nonischemic cardiomyopathy with ejection fraction of 20%, left bundle branch block, stage V CKD, A. fib status post AV nodal ablation with [facility] PPM in 2019-on Eliquis, anemia of chronic disease, hypertension, renal artery stenosis, obstructive sleep apnea on CPAP presents to emergency department due to worsening shortness of breath since 3 weeks.  Patient tells me that she went to heart failure clinic this morning-due to her symptoms she was brought to the emergency department for further evaluation and management. Patient reports exertional shortness of breath even with few steps, abdominal distention and weight gain. She has been compliant with her diuretics and low-sodium diet. No history of smoking, alcohol, illicit drug use. She reports 20 pound weight gain, water retention mostly in the abdomen.  She has stage V kidney disease and she is nearing hemodialysis. She is awaiting consultation with vascular surgeon for placement of HD access. She reports that she still makes urine.  Recently diagnosed with hypothyroidism and takes levothyroxine as prescribed. She has obstructive sleep apnea and she uses CPAP and 2 L of oxygen at bedtime only.  She denies chest pain, palpitation, leg swelling, orthopnea, PND, nausea, vomiting, abdominal pain, diarrhea, headache, blurry vision, lightheadedness or dizziness.  ED Course:Upon arrival: Patient respiratory rate in 20s, on 2 L of oxygen via nasal cannula, chest x-ray shows cardiomegaly with pulmonary vascular congestion, proBNP elevated more than 4500, troponin elevated at 61, COVID-19 pending. EDP consulted cardiology for further evaluation and management.  The patient is being diuresed with IV Lasix 160 mg bid. Nephrology  has also been consulted as the patient is nearing initiation of dialysis. Her creatinine is elevated from 3.6 last month to 4.5 this admission.  They plan to initiate HDas her BUN is very high and her volume overload appears refractory to diuresis.  Plan is for tunnelled catheter on 02/01/2019. Then initiation of HD. Plan is for removal by cardiology of atrial lead for PPM with placement of LV lead after fistula is mature and in stable use.  Consultants  . Heart failure . Nephrology  Procedures  . None  Antibiotics   Anti-infectives (From admission, onward)   None     Subjective  The patient is resting comfortably. No new complaints.   Objective   Vitals:  Vitals:   01/31/19 0806 01/31/19 1146  BP: 111/68 118/67  Pulse: 75 74  Resp: 20 20  Temp: 98.5 F (36.9 C) 98 F (36.7 C)  SpO2: 94% 100%   Exam:  Constitutional:  . The patient is awake, alert, and oriented x 3. No acute distress. Respiratory:  . No increased work of breathing. . No wheezes, rales, or rhonchi . No tactile fremitus Cardiovascular:  . Regular rate and rhythm . No murmurs, ectopy, or gallups. . No lateral PMI. No thrills. Abdomen:  . Abdomen is soft, non-tender, non-distended . No hernias, masses, or organomegaly . Normoactive bowel sounds.  Musculoskeletal:  . Slight warmth and tenderness of left knee.  [name] No cyanosis or clubbing. . No edema Skin:  . No rashes, lesions, ulcers . palpation of skin: no induration or nodules . Warmth of left knee Neurologic:  . CN 2-12 intact . Sensation all 4 extremities intact Psychiatric:  . Mental status o Mood, affect appropriate o Orientation to person, place, time  . judgment and insight  appear intact  I have personally reviewed the following:   Today's Data  . BMP, Vitals  Scheduled Meds: . amiodarone  200 mg Oral Daily  . carvedilol  12.5 mg Oral BID  . Chlorhexidine Gluconate Cloth  6 each Topical Q0600  . docusate sodium  200 mg Oral  QHS  . ferrous sulfate  325 mg Oral Q breakfast  . levothyroxine  75 mcg Oral QAC breakfast  . sodium chloride flush  3 mL Intravenous Once   Continuous Infusions: . furosemide      Principal Problem:   Acute on chronic congestive heart failure (HCC) Active Problems:   Hypertension   Mitral regurgitation   Anemia of chronic disease   OSA (obstructive sleep apnea)   Atrial fibrillation with RVR (HCC)   Pacemaker   Hypothyroidism   CKD (chronic kidney disease) stage 5, GFR less than 15 ml/min (HCC)   PVC (premature ventricular contraction)   Pressure injury of skin   LOS: 4 days   A & P  Acute on chronic congestive heart failure: Cardiology consulted. Patient is being diuresed with lasix 80 mg IV daily. Most recent echocardiogram was in 08/2018 and demonstrated EF of 20% mildly dilated left ventricle with diffuse hypokinesis. There is mildly reduced right ventricular function. Fluid balance is currently negative 4.5 liters. Volume status is being monitored closely as is renal function and electrolytes. Pt has a pacemaker in place. An attempt was made by cardiology in 12/2018 to place an LV lead in the patient with removal of atrial lead to improve her output. This was unsuccessful due to upper extremity thrombosis. The plan now is for the patient to return for this procedure after her AV fistula is mature and is stable in use. They do not think that placement of LV lead would in any way obviate the need for initiation of HD.  Stage V chronic kidney disease stage end-stage renal disease: Creatinine 4.25 today. Monitor electrolytes, creatinine, and volume status carefully. Avoid nephrotoxins and hypotension. Nephrology has been consulted and is planning initiation of HD. She has recently been evaluated by vascular surgery for HD access. She will have a tunnelled catheter placed on 02/01/2019.  Hypokalemia: Resolved. Monitor.  Left knee pain: Knee is warm, but not erythematous or swollen.  Patient denies pain prior to hospitalization. X-ray of left knee is unremarkable. Consult orthopedic surgery.  A. fib with [id number]. Heart rate is controlled. She is on Eliquis 5 mg bid. She is s/p AV nodal ablation with [place] PPM. She is being monitored on telemetry and cardiology has been consulted. Eliquis will be held for tunnelled catheter placement.  Anemia of chronic disease with mild iron deficiency: Noted and stable. Likely from chronic kidney disease. Monitor H&H and continue ferrous sulfate.  Hypothyroidism: Continue levothyroxine as outpatient.  PVCs: Continue amiodarone as at home. Monitor electrolytes.  Obstructive sleep apnea: On CPAP at home, will continue same.  I have seen and examined this patient myself. I have spent 30 minutes in her evaluation and care.  DVT prophylaxis: TED/SCD/eliquis Code Status: Full code Family Communication: None available. Disposition Plan: TBD   Dulce Martian, DO [facility] Direct contact: see [email or web address]  7PM-7AM contact night coverage as above 01/31/2019, 2:24 PM  LOS: 2 days

## 2019-01-31 NOTE — Progress Notes (Signed)
[facility] Progress Note  Name: Gina [name] MRN: [id number] DOB: 01/01/1941  Chief Complaint:  Shortness of breath   Subjective:    She had 2.4 liters UOP over 11/28 with IV lasix x 2 doses.  Feels less swollen.  Does want to proceed dialysis - "my nephrologist said I was getting close and if anything happened we would have to do something in a hurry".     Review of systems: Denies shortness of breath  Has been on oxygen  No n/v Appetite ok  No chest pain    ---------------- Background on consult:  Gina [name] is an 78 y.o. female.  HPI: Gina [name] has an extensive PMH significant for atrial fibrillation s/p AV nodal ablation with [facility] Jude PPM on eliquis, HTN, OSA on CPAP, chronic CHF due to nonischemic CMP (EF 20%), LBBB, and CKD stage 4-5 who presented with 3 week history of worsening SOB, DOE, 20 lbs weight gain, and orthopnea.  She also has a recent diagnosis of hypothyroidism with recent increase in replacement therapy.  She has had increasing doses of torsemide per heart failure clinic prior to admission, however given her progressive symptoms she was sent to the ED.  We were consulted to help further evaluate and manage her AKI/CKD vs progressive CKD.  She is followed by Nephrology in [place], [place] and was referred for access placement due to her progressive CKD.  She has not seen the vascular surgeons yet but was aware of her worsening renal function.   She does have some nausea, anorexia, and fatigue but denies dysgeusia, increased somnolence, or myoclonic jerking.       Intake/Output Summary (Last 24 hours) at 01/31/2019 0837 Last data filed at 01/31/2019 0640 Gross per 24 hour  Intake 1350 ml  Output 2350 ml  Net -1000 ml    Vitals:  Vitals:   01/30/19 2207 01/31/19 0015 01/31/19 0517 01/31/19 0806  BP: 105/62 (!) 96/59 121/73 111/68  Pulse: 71 71 75 75  Resp:  20 20 20   Temp:  98 F (36.7 C) 97.6 F (36.4 C) 98.5 F (36.9 C)  TempSrc:   Oral Oral Oral  SpO2:  100% 100% 94%  Weight:   96.6 kg   Height:         Physical Exam:  General adult female in bed in no acute distress  HEENT normocephalic atraumatic extraocular movements intact sclera anicteric Neck supple trachea midline Lungs clear to auscultation; normal work of breathing at rest; on 4 liters oxygen  Heart regular rate and rhythm no rubs Abdomen soft nontender distended obese habitus Extremities no edema lower extremities  Psych normal mood and affect Neuro - alert and oriented x 3   Medications reviewed   Labs:  BMP Latest Ref Rng & Units 01/31/2019 01/30/2019 01/30/2019  Glucose 70 - 99 mg/dL 91 146(H) 102(H)  BUN 8 - 23 mg/dL 113(H) 115(H) 120(H)  Creatinine 0.44 - 1.00 mg/dL 3.56(H) 3.74(H) 4.08(H)  BUN/Creat Ratio 12 - 28 - - -  Sodium 135 - 145 mmol/L 141 139 142  Potassium 3.5 - 5.1 mmol/L 3.2(L) 3.5 3.9  Chloride 98 - 111 mmol/L 95(L) 95(L) 97(L)  CO2 22 - 32 mmol/L 32 30 29  Calcium 8.9 - 10.3 mg/dL 8.4(L) 8.3(L) 8.7(L)     Assessment/Plan:   1. CKD stage V with progression to ESRD due to cardiorenal syndrome-  she has failed oral diuretics.  In past had a failed attempt to convert to biventricular pacer  on 12/29/18 due to an occluded left subclavian vein - she had hoped to have procedure on pacer before HD but with overload refractory to oral diuretics and BUN over 126 on presentation appears has progressed to ESRD.  Spoke with Dr. Lovena Le 11/26 and given that dialysis is imminent he would like to wait until she has a mature fistula in place without a tunneled catheter still in use prior to any procedure on her pacer.  Infection risk would be higher with a catheter in place.  He is ok with holding eliquis for access placement.  BUN/cr improved slightly with more effective diuresis with IV diuretics.  Still feel that she would benefit from HD - Recommend initiation of dialysis after tunneled catheter placement on 11/30 - hold eliquis for now for  access placement  - Will need to CLIP - lives near [place] in [place].  - NPO after 12:01 am on 11/30 for access  - Continue Lasix IV today  - She will need to reschedule appt with vascular locally in [place] (was for next week) if she misses due to hospitalization   2. Acute on chronic CHF  1. Continue lasix until HD initiated then follow for needs 2. Continue to follow strict I's/O's and daily weights.  3. Anemia of CKD stage 5- iron is replete.  aranesp - 40 mcg on 11/26   4. A fib with AV nodal ablation- per cardiology 5. Hypothyroidism- cont with replacement therapy 6. OSA- on CPAP 7. PVC's- per Cardiology 8. Hypokalemia - replete K; plan for 3K bath for HD for now.  Potassium at 1300 today     Claudia Desanctis, MD 01/31/2019 8:37 AM

## 2019-02-01 ENCOUNTER — Encounter (HOSPITAL_COMMUNITY): Payer: Self-pay | Admitting: Interventional Radiology

## 2019-02-01 ENCOUNTER — Inpatient Hospital Stay (HOSPITAL_COMMUNITY): Payer: Medicare Other

## 2019-02-01 HISTORY — PX: IR US GUIDE VASC ACCESS RIGHT: IMG2390

## 2019-02-01 HISTORY — PX: IR FLUORO GUIDE CV LINE RIGHT: IMG2283

## 2019-02-01 LAB — RENAL FUNCTION PANEL
Albumin: 2.9 g/dL — ABNORMAL LOW (ref 3.5–5.0)
Anion gap: 15 (ref 5–15)
BUN: 112 mg/dL — ABNORMAL HIGH (ref 8–23)
CO2: 27 mmol/L (ref 22–32)
Calcium: 8.1 mg/dL — ABNORMAL LOW (ref 8.9–10.3)
Chloride: 96 mmol/L — ABNORMAL LOW (ref 98–111)
Creatinine, Ser: 3.48 mg/dL — ABNORMAL HIGH (ref 0.44–1.00)
GFR calc Af Amer: 14 mL/min — ABNORMAL LOW (ref >60)
GFR calc non Af Amer: 12 mL/min — ABNORMAL LOW (ref >60)
Glucose, Bld: 95 mg/dL (ref 70–99)
Phosphorus: 4.5 mg/dL (ref 2.5–4.6)
Potassium: 4 mmol/L (ref 3.5–5.1)
Sodium: 138 mmol/L (ref 135–145)

## 2019-02-01 LAB — CBC
HCT: 32.9 % — ABNORMAL LOW (ref 36.0–46.0)
Hemoglobin: 10.4 g/dL — ABNORMAL LOW (ref 12.0–15.0)
MCH: 33.7 pg (ref 26.0–34.0)
MCHC: 31.6 g/dL (ref 30.0–36.0)
MCV: 106.5 fL — ABNORMAL HIGH (ref 80.0–100.0)
Platelets: 193 10*3/uL (ref 150–400)
RBC: 3.09 MIL/uL — ABNORMAL LOW (ref 3.87–5.11)
RDW: 16.5 % — ABNORMAL HIGH (ref 11.5–15.5)
WBC: 7 10*3/uL (ref 4.0–10.5)
nRBC: 0 % (ref 0.0–0.2)

## 2019-02-01 LAB — MAGNESIUM: Magnesium: 2.2 mg/dL (ref 1.7–2.4)

## 2019-02-01 LAB — PROTIME-INR
INR: 1.4 — ABNORMAL HIGH (ref 0.8–1.2)
Prothrombin Time: 16.6 seconds — ABNORMAL HIGH (ref 11.4–15.2)

## 2019-02-01 MED ORDER — APIXABAN 5 MG PO TABS
5.0000 mg | ORAL_TABLET | Freq: Two times a day (BID) | ORAL | Status: DC
  Administered 2019-02-01 – 2019-02-02 (×2): 5 mg via ORAL
  Filled 2019-02-01 (×2): qty 1

## 2019-02-01 MED ORDER — PENTAFLUOROPROP-TETRAFLUOROETH EX AERO: 1.0000 "application " | INHALATION_SPRAY | CUTANEOUS | Status: DC | PRN

## 2019-02-01 MED ORDER — SODIUM CHLORIDE 0.9 % IV SOLN: 100.0000 mL | INTRAVENOUS | Status: DC | PRN

## 2019-02-01 MED ORDER — FENTANYL CITRATE (PF) 100 MCG/2ML IJ SOLN
INTRAMUSCULAR | Status: AC | PRN
  Administered 2019-02-01: 50 ug via INTRAVENOUS

## 2019-02-01 MED ORDER — HEPARIN SODIUM (PORCINE) 1000 UNIT/ML IJ SOLN
INTRAMUSCULAR | Status: AC
  Filled 2019-02-01: qty 3

## 2019-02-01 MED ORDER — SODIUM CHLORIDE 0.9 % IV SOLN
INTRAVENOUS | Status: AC | PRN
  Administered 2019-02-01: 10 mL/h via INTRAVENOUS

## 2019-02-01 MED ORDER — ALTEPLASE 2 MG IJ SOLR: 2.0000 mg | Freq: Once | INTRAMUSCULAR | Status: DC | PRN

## 2019-02-01 MED ORDER — TROLAMINE SALICYLATE 10 % EX CREA
TOPICAL_CREAM | Freq: Two times a day (BID) | CUTANEOUS | Status: DC | PRN
  Filled 2019-02-01: qty 85

## 2019-02-01 MED ORDER — VANCOMYCIN HCL IN DEXTROSE 1-5 GM/200ML-% IV SOLN
INTRAVENOUS | Status: AC
  Filled 2019-02-01: qty 200

## 2019-02-01 MED ORDER — LIDOCAINE-PRILOCAINE 2.5-2.5 % EX CREA: 1.0000 "application " | TOPICAL_CREAM | CUTANEOUS | Status: DC | PRN

## 2019-02-01 MED ORDER — LIDOCAINE HCL (PF) 1 % IJ SOLN: 5.0000 mL | INTRAMUSCULAR | Status: DC | PRN

## 2019-02-01 MED ORDER — LIDOCAINE HCL 1 % IJ SOLN
INTRAMUSCULAR | Status: AC
  Filled 2019-02-01: qty 20

## 2019-02-01 MED ORDER — LIDOCAINE HCL (PF) 1 % IJ SOLN
INTRAMUSCULAR | Status: AC | PRN
  Administered 2019-02-01: 10 mL

## 2019-02-01 MED ORDER — MIDAZOLAM HCL 2 MG/2ML IJ SOLN
INTRAMUSCULAR | Status: AC | PRN
  Administered 2019-02-01: 1 mg via INTRAVENOUS

## 2019-02-01 MED ORDER — HEPARIN SODIUM (PORCINE) 1000 UNIT/ML IJ SOLN
INTRAMUSCULAR | Status: AC
  Administered 2019-02-01: 3.2 mL
  Filled 2019-02-01: qty 1

## 2019-02-01 MED ORDER — FENTANYL CITRATE (PF) 100 MCG/2ML IJ SOLN
INTRAMUSCULAR | Status: AC
  Filled 2019-02-01: qty 2

## 2019-02-01 MED ORDER — VANCOMYCIN HCL IN DEXTROSE 1-5 GM/200ML-% IV SOLN
1000.0000 mg | Freq: Once | INTRAVENOUS | Status: DC
  Filled 2019-02-01: qty 200

## 2019-02-01 MED ORDER — MIDAZOLAM HCL 2 MG/2ML IJ SOLN
INTRAMUSCULAR | Status: AC
  Filled 2019-02-01: qty 2

## 2019-02-01 MED ORDER — HEPARIN SODIUM (PORCINE) 1000 UNIT/ML DIALYSIS: 1000.0000 [IU] | INTRAMUSCULAR | Status: DC | PRN

## 2019-02-01 MED ORDER — MUSCLE RUB 10-15 % EX CREA
TOPICAL_CREAM | Freq: Two times a day (BID) | CUTANEOUS | Status: DC | PRN
  Filled 2019-02-01: qty 85

## 2019-02-01 MED ORDER — VANCOMYCIN HCL IN DEXTROSE 750-5 MG/150ML-% IV SOLN
INTRAVENOUS | Status: AC | PRN
  Administered 2019-02-01: 1000 mg via INTRAVENOUS

## 2019-02-01 NOTE — Procedures (Signed)
  Procedure: R IJ tunneled HD cath Palindrome 19 EBL:   minimal Complications:  none immediate  See full dictation in [facility].  Dillard Cannon MD Main # [phone] Pager  [phone]

## 2019-02-01 NOTE — Progress Notes (Signed)
Visit made to patients room to place patient on CPAP.  Patient had a family member bring her home CPAP nasal mask leaving out the nose elbow swivel piece that is needed to use it.  I advised patient we do not have a piece to add to her mask and placed her on our nasal mask that patient wore the night before.  Patient tolerating at this time.

## 2019-02-01 NOTE — Care Management Important Message (Signed)
Important Message  Patient Details  Name: Gina [name] MRN: [id number] Date of Birth: 11/12/1940   Medicare Important Message Given:  Yes     Shelda Altes 02/01/2019, 11:59 AM

## 2019-02-01 NOTE — Progress Notes (Signed)
Ref CPAP for the night

## 2019-02-01 NOTE — Progress Notes (Signed)
Patient ID: Gina [name], female   DOB: 17-Mar-1940, 78 y.o.   MRN: [id number]     Advanced Heart Failure Rounding Note  PCP-Cardiologist: Carlyle Dolly, MD   Subjective:    BUN/creatinine trending down.  She is going for tunneled catheter placement today. Getting Lasix 100 mg IV bid.   Objective:   Weight Range: 99.1 kg Body mass index is 37.5 kg/m.   Vital Signs:   Temp:  [97.8 F (36.6 C)-98.8 F (37.1 C)] 98.8 F (37.1 C) (11/30 0715) Pulse Rate:  [69-77] 69 (11/30 0715) Resp:  [18-20] 20 (11/30 0425) BP: (99-118)/(61-67) 107/61 (11/30 0715) SpO2:  [95 %-100 %] 100 % (11/30 0715) Weight:  [99.1 kg] 99.1 kg (11/30 0425) Last BM Date: 01/27/19  Weight change: Filed Weights   01/30/19 0451 01/31/19 0517 02/01/19 0425  Weight: 98.8 kg 96.6 kg 99.1 kg    Intake/Output:   Intake/Output Summary (Last 24 hours) at 02/01/2019 0811 Last data filed at 02/01/2019 0500 Gross per 24 hour  Intake 480 ml  Output 1300 ml  Net -820 ml      Physical Exam    General: NAD Neck: JVP 9-10 cm, no thyromegaly or thyroid nodule.  Lungs: Clear to auscultation bilaterally with normal respiratory effort. CV: Nondisplaced PMI.  Heart regular S1/S2, no S3/S4, 1/6 SEM RUSB.  No peripheral edema.   Abdomen: Soft, nontender, no hepatosplenomegaly, no distention.  Skin: Intact without lesions or rashes.  Neurologic: Alert and oriented x 3.  Psych: Normal affect. Extremities: No clubbing or cyanosis.  HEENT: Normal.   Telemetry   Atrial fibrillation with V-pacing rate 70s. Personally reviewed  Labs    CBC Recent Labs    01/31/19 0441 02/01/19 0505  WBC 5.8 7.0  NEUTROABS 4.5  --   HGB 10.2* 10.4*  HCT 32.7* 32.9*  MCV 108.3* 106.5*  PLT 184 0000000   Basic Metabolic Panel Recent Labs    01/31/19 0441 01/31/19 1340 02/01/19 0505  NA 141  --  138  K 3.2* 4.2 4.0  CL 95*  --  96*  CO2 32  --  27  GLUCOSE 91  --  95  BUN 113*  --  112*  CREATININE 3.56*  --  3.48*    CALCIUM 8.4*  --  8.1*  MG 2.2  --  2.2  PHOS 4.2  --  4.5   Liver Function Tests Recent Labs    01/31/19 0441 02/01/19 0505  ALBUMIN 3.0* 2.9*   No results for input(s): LIPASE, AMYLASE in the last 72 hours. Cardiac Enzymes No results for input(s): CKTOTAL, CKMB, CKMBINDEX, TROPONINI in the last 72 hours.  BNP: BNP (last 3 results) Recent Labs    01/27/19 1235  BNP >4,500.0*    ProBNP (last 3 results) No results for input(s): PROBNP in the last 8760 hours.   D-Dimer No results for input(s): DDIMER in the last 72 hours. Hemoglobin A1C No results for input(s): HGBA1C in the last 72 hours. Fasting Lipid Panel No results for input(s): CHOL, HDL, LDLCALC, TRIG, CHOLHDL, LDLDIRECT in the last 72 hours. Thyroid Function Tests No results for input(s): TSH, T4TOTAL, T3FREE, THYROIDAB in the last 72 hours.  Invalid input(s): FREET3  Other results:   Imaging    No results found.   Medications:     Scheduled Medications:  amiodarone  200 mg Oral Daily   carvedilol  12.5 mg Oral BID   Chlorhexidine Gluconate Cloth  6 each Topical Q0600   docusate sodium  200 mg Oral QHS   ferrous sulfate  325 mg Oral Q breakfast   levothyroxine  75 mcg Oral QAC breakfast   sodium chloride flush  3 mL Intravenous Once    Infusions:  furosemide 100 mg (01/31/19 1630)    PRN Medications: acetaminophen **OR** acetaminophen, ondansetron **OR** ondansetron (ZOFRAN) IV   Assessment/Plan   1. Acute on chronic Systolic CHF: Echo (A999333) with EF 20%. Long-standing cardiomyopathy, nonischemic based on cath in 2017. 7/19 hospitalization withlow output HF likely triggered by atrial fibrillation/RVR.  She was started on milrinone.  DCCVs failed and she ultimately had AV nodal ablation with [facility] Jude PPM placed (unable to place LV lead).  Echo in 6/20 showed EF 20%.  Re-attempted LV lead in 10/20, unable to place due to occluded left subclavian vein.  Recently, she has had  worsening renal function and has gained 20 lbs, admitted with CHF exacerbation and AKI on CKD stage 5, BUN very high.  She has been able to diurese reasonably well with high dose IV Lasix, but with very high BUN and peri-dialysis state prior to admission, nephrology has decided on initiation of dialysis this admission.    - Continue Coreg 12.5 mg bid.    - No ACEI/ARNI/ARB/spironolactone with elevated creatinine.  - She has a high percentage of RV pacing, have failed LV lead placement so far.  Discussed with Dr. Lovena Le => looks like she is likely to end up needing HD (do not think that BiV pacing would prevent HD at this point), and re-instrumenting her in the setting of a dialysis catheter would have high risk of infection.  We will plan on her starting HD, then down the road when she has a functional AV fistula, will bring her back for removal of atrial lead and placement of LV lead.  - Plan for 1st HD session after catheter placed today.    2. AKI on CKD stage 5: BUN has been markedly high but she seems clear mentally.  BUN and creatinine have actually trended down some with diuresis here.  With very high BUN and prior peri-dialysis state, nephrology has decided on HD initiation at this point.   3. Atrial fibrillation: Chronic.   She is now s/p AV nodal ablation with St Jude PPM.  -Eliquis on hold for catheter placement, restart afterwards.  4. OSA - Continue CPAP qHS. 5. PVCs: She is on amiodarone for frequent PVCs (?contributing to cardiomyopathy).  Zio patch in 4/20 on amiodarone showed only rare PVCs.  - Continue amiodarone 200 mg daily.    6. Mitral regurgitation: moderate on 6/20 echo.   Length of Stay: 5  [name], MD  02/01/2019, 8:11 AM  Advanced Heart Failure Team Pager [phone] (M-F; 7a - 4p)  Please contact [facility] for night-coverage after hours (4p -7a ) and weekends on [email or web address]

## 2019-02-01 NOTE — Progress Notes (Signed)
Patient ID: Gina [name], female   DOB: 20-Apr-1940, 78 y.o.   MRN: [id number] S: Feeling better and tolerated TDC placement O:BP 129/73   Pulse (!) 110   Temp 97.7 F (36.5 C) (Oral)   Resp (!) 32   Ht 5\' 4"  (1.626 m)   Wt 99.1 kg   SpO2 94%   BMI 37.50 kg/m   Intake/Output Summary (Last 24 hours) at 02/01/2019 1640 Last data filed at 02/01/2019 1639 Gross per 24 hour  Intake 1220 ml  Output 1500 ml  Net -280 ml   Intake/Output: I/O last 3 completed shifts: In: 1080 [P.O.:1080] Out: 2450 [Urine:2450]  Intake/Output this shift:  Total I/O In: 740 [P.O.:480; IV Piggyback:260] Out: 600 [Urine:600] Weight change: 2.5 kg Gen: NAD CVS: no rub, tachy Resp: cta Abd: obese, +BS, soft, NT Ext: trace pretibial edema.  Recent Labs  Lab 01/27/19 1233 01/27/19 1652 01/28/19 0237 01/29/19 0406 01/29/19 1122 01/30/19 0448 01/30/19 1828 01/31/19 0441 01/31/19 1340 02/01/19 0505  NA 138  --  140 142  --  142 139 141  --  138  K 3.7  --  3.4* 2.9* 3.0* 3.9 3.5 3.2* 4.2 4.0  CL 94*  --  96* 95*  --  97* 95* 95*  --  96*  CO2 28  --  27 30  --  29 30 32  --  27  GLUCOSE 122*  --  103* 105*  --  102* 146* 91  --  95  BUN 126*  --  126* 124*  --  120* 115* 113*  --  112*  CREATININE 4.50* 4.36* 4.55* 4.25*  --  4.08* 3.74* 3.56*  --  3.48*  ALBUMIN  --  3.5 3.3* 3.2*  --  3.1*  --  3.0*  --  2.9*  CALCIUM 9.4  --  9.3 9.0  --  8.7* 8.3* 8.4*  --  8.1*  PHOS  --  5.0*  --  4.8*  --  4.1  --  4.2  --  4.5  AST  --  47* 41  --   --   --   --   --   --   --   ALT  --  103* 99*  --   --   --   --   --   --   --    Liver Function Tests: Recent Labs  Lab 01/27/19 1652 01/28/19 0237  01/30/19 0448 01/31/19 0441 02/01/19 0505  AST 47* 41  --   --   --   --   ALT 103* 99*  --   --   --   --   ALKPHOS 69 66  --   --   --   --   BILITOT 1.7* 1.6*  --   --   --   --   PROT 6.4* 5.7*  --   --   --   --   ALBUMIN 3.5 3.3*   < > 3.1* 3.0* 2.9*   < > = values in this interval not  displayed.   No results for input(s): LIPASE, AMYLASE in the last 168 hours. No results for input(s): AMMONIA in the last 168 hours. CBC: Recent Labs  Lab 01/27/19 1233 01/27/19 1652 01/28/19 0237 01/31/19 0441 02/01/19 0505  WBC 6.3 5.5 5.2 5.8 7.0  NEUTROABS  --   --   --  4.5  --   HGB 10.5* 10.1* 9.5* 10.2* 10.4*  HCT 33.9*  32.5* 30.4* 32.7* 32.9*  MCV 108.0* 107.3* 108.2* 108.3* 106.5*  PLT 253 234 221 184 193   Cardiac Enzymes: No results for input(s): CKTOTAL, CKMB, CKMBINDEX, TROPONINI in the last 168 hours. CBG: No results for input(s): GLUCAP in the last 168 hours.  Iron Studies: No results for input(s): IRON, TIBC, TRANSFERRIN, FERRITIN in the last 72 hours. Studies/Results: Dg Knee 1-2 Views Left  Result Date: 01/30/2019 CLINICAL DATA:  Left knee pain, swelling.  Remote replacement EXAM: LEFT KNEE - 1-2 VIEW COMPARISON:  None. FINDINGS: Changes of left knee replacement. No hardware complicating feature. No acute bony abnormality. Specifically, no fracture, subluxation, or dislocation. No joint effusion. IMPRESSION: Left knee replacement. No complicating feature. No acute bony abnormality. Electronically Signed   By: Rolm Baptise M.D.   On: 01/30/2019 19:25   Ir Fluoro Guide Cv Line Right  Result Date: 02/01/2019 CLINICAL DATA:  Renal failure, needs durable venous access for hemodialysis EXAM: TUNNELED HEMODIALYSIS CATHETER PLACEMENT WITH ULTRASOUND AND FLUOROSCOPIC GUIDANCE TECHNIQUE: The procedure, risks, benefits, and alternatives were explained to the patient. Questions regarding the procedure were encouraged and answered. The patient understands and consents to the procedure. As antibiotic prophylaxis, vancomycin 1 g was ordered pre-procedure and administered intravenously within one hour of incision. Patency of the right IJ vein was confirmed with ultrasound with image documentation. An appropriate skin site was determined. Region was prepped using maximum barrier  technique including cap and mask, sterile gown, sterile gloves, large sterile sheet, and Chlorhexidine as cutaneous antisepsis. The region was infiltrated locally with 1% lidocaine. Intravenous Fentanyl 74mcg and Versed 1mg  were administered as conscious sedation during continuous monitoring of the patient's level of consciousness and physiological / cardiorespiratory status by the radiology RN, with a total moderate sedation time of 13 minutes. Under real-time ultrasound guidance, the right IJ vein was accessed with a 21 gauge micropuncture needle; the needle tip within the vein was confirmed with ultrasound image documentation. Needle exchanged over the 018 guidewire for transitional dilator, which allowed advancement of a Benson wire into the IVC. Over this, an MPA catheter was advanced. A Palindrome 19 hemodialysis catheter was tunneled from the right anterior chest wall approach to the right IJ dermatotomy site. The MPA catheter was exchanged over an Amplatz wire for serial vascular dilators which allow placement of a peel-away sheath, through which the catheter was advanced under intermittent fluoroscopy, positioned with its tips in the proximal and midright atrium. Spot chest radiograph confirms good catheter position. No pneumothorax. Catheter was flushed and primed per protocol. Catheter secured externally with O Prolene sutures. The right IJ dermatotomy site was closed with Dermabond. COMPLICATIONS: COMPLICATIONS None immediate FLUOROSCOPY TIME:  0.2 minute; 39  uGym2 DAP COMPARISON:  None IMPRESSION: 1. Technically successful placement of tunneled right IJ hemodialysis catheter with ultrasound and fluoroscopic guidance. Ready for routine use. ACCESS: Remains approachable for percutaneous intervention as needed. Electronically Signed   By: Lucrezia Europe M.D.   On: 02/01/2019 11:02   Ir US Guide Vasc Access Right  Result Date: 02/01/2019 CLINICAL DATA:  Renal failure, needs durable venous access for  hemodialysis EXAM: TUNNELED HEMODIALYSIS CATHETER PLACEMENT WITH ULTRASOUND AND FLUOROSCOPIC GUIDANCE TECHNIQUE: The procedure, risks, benefits, and alternatives were explained to the patient. Questions regarding the procedure were encouraged and answered. The patient understands and consents to the procedure. As antibiotic prophylaxis, vancomycin 1 g was ordered pre-procedure and administered intravenously within one hour of incision. Patency of the right IJ vein was confirmed with ultrasound with image  documentation. An appropriate skin site was determined. Region was prepped using maximum barrier technique including cap and mask, sterile gown, sterile gloves, large sterile sheet, and Chlorhexidine as cutaneous antisepsis. The region was infiltrated locally with 1% lidocaine. Intravenous Fentanyl 80mcg and Versed 1mg  were administered as conscious sedation during continuous monitoring of the patient's level of consciousness and physiological / cardiorespiratory status by the radiology RN, with a total moderate sedation time of 13 minutes. Under real-time ultrasound guidance, the right IJ vein was accessed with a 21 gauge micropuncture needle; the needle tip within the vein was confirmed with ultrasound image documentation. Needle exchanged over the 018 guidewire for transitional dilator, which allowed advancement of a Benson wire into the IVC. Over this, an MPA catheter was advanced. A Palindrome 19 hemodialysis catheter was tunneled from the right anterior chest wall approach to the right IJ dermatotomy site. The MPA catheter was exchanged over an Amplatz wire for serial vascular dilators which allow placement of a peel-away sheath, through which the catheter was advanced under intermittent fluoroscopy, positioned with its tips in the proximal and midright atrium. Spot chest radiograph confirms good catheter position. No pneumothorax. Catheter was flushed and primed per protocol. Catheter secured externally with  O Prolene sutures. The right IJ dermatotomy site was closed with Dermabond. COMPLICATIONS: COMPLICATIONS None immediate FLUOROSCOPY TIME:  0.2 minute; 39  uGym2 DAP COMPARISON:  None IMPRESSION: 1. Technically successful placement of tunneled right IJ hemodialysis catheter with ultrasound and fluoroscopic guidance. Ready for routine use. ACCESS: Remains approachable for percutaneous intervention as needed. Electronically Signed   By: Lucrezia Europe M.D.   On: 02/01/2019 11:02   . amiodarone  200 mg Oral Daily  . apixaban  5 mg Oral BID  . carvedilol  12.5 mg Oral BID  . Chlorhexidine Gluconate Cloth  6 each Topical Q0600  . docusate sodium  200 mg Oral QHS  . ferrous sulfate  325 mg Oral Q breakfast  . levothyroxine  75 mcg Oral QAC breakfast  . sodium chloride flush  3 mL Intravenous Once    BMET    Component Value Date/Time   NA 138 02/01/2019 0505   NA 138 12/25/2018 1134   K 4.0 02/01/2019 0505   CL 96 (L) 02/01/2019 0505   CO2 27 02/01/2019 0505   GLUCOSE 95 02/01/2019 0505   BUN 112 (H) 02/01/2019 0505   BUN 99 (HH) 12/25/2018 1134   CREATININE 3.48 (H) 02/01/2019 0505   CALCIUM 8.1 (L) 02/01/2019 0505   GFRNONAA 12 (L) 02/01/2019 0505   GFRAA 14 (L) 02/01/2019 0505   CBC    Component Value Date/Time   WBC 7.0 02/01/2019 0505   RBC 3.09 (L) 02/01/2019 0505   HGB 10.4 (L) 02/01/2019 0505   HGB 9.9 (L) 12/25/2018 1134   HCT 32.9 (L) 02/01/2019 0505   HCT 29.6 (L) 12/25/2018 1134   PLT 193 02/01/2019 0505   PLT 151 12/25/2018 1134   MCV 106.5 (H) 02/01/2019 0505   MCV 103 (H) 12/25/2018 1134   MCH 33.7 02/01/2019 0505   MCHC 31.6 02/01/2019 0505   RDW 16.5 (H) 02/01/2019 0505   RDW 15.2 12/25/2018 1134   LYMPHSABS 0.4 (L) 01/31/2019 0441   LYMPHSABS 0.3 (L) 12/25/2018 1134   MONOABS 0.7 01/31/2019 0441   EOSABS 0.2 01/31/2019 0441   EOSABS 0.0 12/25/2018 1134   BASOSABS 0.0 01/31/2019 0441   BASOSABS 0.0 12/25/2018 1134    Assessment/Plan: 1.  AKI/CKD stage 5 vs  progressive CKD  due to cardiorenal syndrome-  She has now progressed to ESRD and is tolerating HD.  failed attempt to convert to biventricular pacer on 12/29/18 due to an occluded left subclavian vein).  For first HD session today  2. Acute on chronic CHF- Heart failure team following.  On coreg. 1. UF with HD as tolerated 2. Continue to follow strict I's/O's and daily weights.  3. Anemia of CKD stage 5- will check iron stores and follow. 4. A fib with AV nodal ablation- consult cardiology and likely EP.  She had failed attempt to convert to biventricular pacer on 12/29/18 due to an occluded left subclavian vein.  Will need functional AVF/AVG before attempting LV lead placement per Cards. 5. Hypothyroidism- cont with replacement therapy 6. OSA- on CPAP 7. PVC's- per Cardiology 8. Vascular access- s/p RIJ TDC placement today by IR.  Will order vein mapping and see if VVS can evaluate for AVF/AVG placement this hospitalization given her need for pacemaker replacement.  Donetta Potts, MD [employer] [phone]

## 2019-02-01 NOTE — Progress Notes (Signed)
Renal Navigator met with patient and her niece/Alice Debby Bud (with patient's permission) who was in the room visiting to discuss OP HD treatment referral. Both ladies were pleasant and welcoming. Patient states she has a Water quality scientist in [place]/Reuben Strum, whom she states is affiliated with both HD clinic[facility] and [facility]. Renal Navigator asked which clinic she would like to be referred to and she chose [facility]. She has no preference of day of the week, but states she would like second shift if possible. Renal Navigator will submitted referral to [facility] Admissions to request treatment at the [facility] for OP HD treatment. Renal Navigator will follow.  Alphonzo Cruise, [place] Renal Navigator [phone]

## 2019-02-01 NOTE — Progress Notes (Signed)
PROGRESS NOTE    Gina [name]  [id number] DOB: [birth year] DOA: 01/27/2019 PCP: Glenda Chroman, MD   Brief Narrative: 78 year old with past medical history significant for chronic systolic heart failure due to nonischemic cardiomyopathy with ejection fraction of 20%, left bundle branch block, a stage V chronic kidney disease, A. fib status post AV nodal ablation s/p  [facility] PPM in 2019 on Eliquis, anemia of chronic disease, hypertension, renal artery stenosis, obstructive sleep apnea on CPAP who presents to the emergency department complaining of worsening shortness of breath for 3 weeks prior to admission.  Patient was evaluated at the heart failure clinic the morning of admission and was referred to the ED for admission.  Patient reported weight gain, shortness of breath on exertion.  She has been compliant with her diuretics.    Patient was admitted for acute on chronic systolic heart failure exacerbation.  She was a started on IV Lexis.  Nephrology was consulted as the patient is nearing initiation of dialysis.   Patient underwent tunnel catheter placement on 02/01/2019, with the plan of initiation hemodialysis during this admission.  Plan is for removal by cardiology of atrial -lead for PPM with placement of LV lead after fistula is mature and in a stable use.   Assessment & Plan:   Principal Problem:   Acute on chronic congestive heart failure (HCC) Active Problems:   Hypertension   Mitral regurgitation   Anemia of chronic disease   OSA (obstructive sleep apnea)   Atrial fibrillation with RVR (HCC)   Pacemaker   Hypothyroidism   CKD (chronic kidney disease) stage 5, GFR less than 15 ml/min (HCC)   PVC (premature ventricular contraction)   Pressure injury of skin   1-Acute on chronic systolic congestive heart failure exacerbation: -Cardiology and nephrology has been helping with patient care during this admission. -Patient was treated with IV lasix 100 mg IV BID. Lasix  has been discontinue, now that patient will start HD>  -An attempt was made by cardiology on 12/2018 to place an LV lead in the patient with removal of lateral leads to improve her output.  This was unsuccessful due to upper extremity thrombosis.  The plan now is for the patient to return for this procedure after her AV fistula is mature and  stable in years. -She is -5 L -Nephrology is recommending initiation of hemodialysis.  2-Stage V chronic kidney disease, progression of end-stage renal disease: -Nephrology was consulted, plan is to initiate hemodialysis during this admission. -Underwent tunnel catheter placement on 11/30.  3-Hyporkalemia: Resolved 4-left knee pain: Knee erythematosus swollen.  Patient was evaluated by orthopedic who is recommending local Aspercreme.  Weightbearing as tolerated.  Follow-up with Dr. Benjaman Kindler in 2 to 4 weeks. 5-A. fib with RVR: Eliquis was held for tunneled catheter placement.  Eliquis resumed today.  Continue with carvedilol. 6-Anemia of chronic disease: With mild iron deficiency anemia: Monitor hemoglobin. 7-Hypothyroidism: Continue with levothyroxine 8-PVCs: Continue with amiodarone 9-Obstructive  sleep apnea: Continue with CPAP   Pressure Injury 01/27/19 Buttocks Right;Left Stage I -  Intact skin with non-blanchable redness of a localized area usually over a bony prominence. (Active)  01/27/19 1700  Location: Buttocks  Location Orientation: Right;Left  Staging: Stage I -  Intact skin with non-blanchable redness of a localized area usually over a bony prominence.  Wound Description (Comments):   Present on Admission: Yes                  Estimated body mass  index is 37.5 kg/m as calculated from the following:   Height as of this encounter: 5\' 4"  (1.626 m).   Weight as of this encounter: 99.1 kg.   DVT prophylaxis: Eliquis Code Status: Full code Family Communication: Niece at bedside Disposition Plan: Home when stable Consultants:    Cardiology  Nephrology  Procedures:   Hemodialysis tunnel catheter placement  Antimicrobials:  Received a dose of vancomycin preprocedure  Subjective: Patient reports she is feeling better, breathing better but not at baseline for her breathing.  She just underwent hemodialysis tunnel catheter placement. Niece is asking if we receive records from [facility] in regards presume  blood clot that patient has left arm. Will ask Nurse to request records.   Objective: Vitals:   01/31/19 2036 02/01/19 0134 02/01/19 0425 02/01/19 0715  BP: 101/67 99/63 102/63 107/61  Pulse: 72 70 70 69  Resp: 20 20 20    Temp: 98.2 F (36.8 C) 97.9 F (36.6 C) 97.8 F (36.6 C) 98.8 F (37.1 C)  TempSrc: Oral Oral Oral Oral  SpO2: 99% 98% 95% 100%  Weight:   99.1 kg   Height:        Intake/Output Summary (Last 24 hours) at 02/01/2019 0735 Last data filed at 02/01/2019 0500 Gross per 24 hour  Intake 720 ml  Output 1300 ml  Net -580 ml   Filed Weights   01/30/19 0451 01/31/19 0517 02/01/19 0425  Weight: 98.8 kg 96.6 kg 99.1 kg    Examination:  General exam: Appears calm and comfortable  Respiratory system: Bilateral crackles Cardiovascular system: S1 & S2 heard, RRR. No JVD, murmurs, rubs, gallops or clicks. No pedal edema. Gastrointestinal system: Abdomen is nondistended, soft and nontender. No organomegaly or masses felt. Normal bowel sounds heard. Central nervous system: Alert and oriented. No focal neurological deficits. Extremities: Symmetric 5 x 5 power. Skin: No rashes, lesions or ulcers   Data Reviewed: I have personally reviewed following labs and imaging studies  CBC: Recent Labs  Lab 01/27/19 1233 01/27/19 1652 01/28/19 0237 01/31/19 0441 02/01/19 0505  WBC 6.3 5.5 5.2 5.8 7.0  NEUTROABS  --   --   --  4.5  --   HGB 10.5* 10.1* 9.5* 10.2* 10.4*  HCT 33.9* 32.5* 30.4* 32.7* 32.9*  MCV 108.0* 107.3* 108.2* 108.3* 106.5*  PLT 253 234 221 184 0000000   Basic  Metabolic Panel: Recent Labs  Lab 01/27/19 1652  01/29/19 0406  01/30/19 0448 01/30/19 1828 01/31/19 0441 01/31/19 1340 02/01/19 0505  NA  --    < > 142  --  142 139 141  --  138  K  --    < > 2.9*   < > 3.9 3.5 3.2* 4.2 4.0  CL  --    < > 95*  --  97* 95* 95*  --  96*  CO2  --    < > 30  --  29 30 32  --  27  GLUCOSE  --    < > 105*  --  102* 146* 91  --  95  BUN  --    < > 124*  --  120* 115* 113*  --  112*  CREATININE 4.36*   < > 4.25*  --  4.08* 3.74* 3.56*  --  3.48*  CALCIUM  --    < > 9.0  --  8.7* 8.3* 8.4*  --  8.1*  MG 2.6*  --   --   --  2.3  --  2.2  --  2.2  PHOS 5.0*  --  4.8*  --  4.1  --  4.2  --  4.5   < > = values in this interval not displayed.   GFR: Estimated Creatinine Clearance: 15.2 mL/min (A) (by C-G formula based on SCr of 3.48 mg/dL (H)). Liver Function Tests: Recent Labs  Lab 01/27/19 1652 01/28/19 0237 01/29/19 0406 01/30/19 0448 01/31/19 0441 02/01/19 0505  AST 47* 41  --   --   --   --   ALT 103* 99*  --   --   --   --   ALKPHOS 69 66  --   --   --   --   BILITOT 1.7* 1.6*  --   --   --   --   PROT 6.4* 5.7*  --   --   --   --   ALBUMIN 3.5 3.3* 3.2* 3.1* 3.0* 2.9*   No results for input(s): LIPASE, AMYLASE in the last 168 hours. No results for input(s): AMMONIA in the last 168 hours. Coagulation Profile: Recent Labs  Lab 01/27/19 1233 02/01/19 0505  INR 2.5* 1.4*   Cardiac Enzymes: No results for input(s): CKTOTAL, CKMB, CKMBINDEX, TROPONINI in the last 168 hours. BNP (last 3 results) No results for input(s): PROBNP in the last 8760 hours. HbA1C: No results for input(s): HGBA1C in the last 72 hours. CBG: No results for input(s): GLUCAP in the last 168 hours. Lipid Profile: No results for input(s): CHOL, HDL, LDLCALC, TRIG, CHOLHDL, LDLDIRECT in the last 72 hours. Thyroid Function Tests: No results for input(s): TSH, T4TOTAL, FREET4, T3FREE, THYROIDAB in the last 72 hours. Anemia Panel: No results for input(s): VITAMINB12,  FOLATE, FERRITIN, TIBC, IRON, RETICCTPCT in the last 72 hours. Sepsis Labs: No results for input(s): PROCALCITON, LATICACIDVEN in the last 168 hours.  Recent Results (from the past 240 hour(s))  SARS CORONAVIRUS 2 (TAT 6-24 HRS) Nasopharyngeal Nasopharyngeal Swab     Status: None   Collection Time: 01/27/19  2:53 PM   Specimen: Nasopharyngeal Swab  Result Value Ref Range Status   SARS Coronavirus 2 NEGATIVE NEGATIVE Final    Comment: (NOTE) SARS-CoV-2 target nucleic acids are NOT DETECTED. The SARS-CoV-2 RNA is generally detectable in upper and lower respiratory specimens during the acute phase of infection. Negative results do not preclude SARS-CoV-2 infection, do not rule out co-infections with other pathogens, and should not be used as the sole basis for treatment or other patient management decisions. Negative results must be combined with clinical observations, patient history, and epidemiological information. The expected result is Negative. Fact Sheet for Patients: SugarRoll.be Fact Sheet for Healthcare Providers: https://www.woods-mathews.com/ This test is not yet approved or cleared by the [place] FDA and  has been authorized for detection and/or diagnosis of SARS-CoV-2 by FDA under an Emergency Use Authorization (EUA). This EUA will remain  in effect (meaning this test can be used) for the duration of the COVID-19 declaration under Section 56 4(b)(1) of the Act, 21 U.S.C. section 360bbb-3(b)(1), unless the authorization is terminated or revoked sooner. Performed at [facility], [place] [address], [place], [place] [address]          Radiology Studies: Dg Knee 1-2 Views Left  Result Date: 01/30/2019 CLINICAL DATA:  Left knee pain, swelling.  Remote replacement EXAM: LEFT KNEE - 1-2 VIEW COMPARISON:  None. FINDINGS: Changes of left knee replacement. No hardware complicating feature. No acute bony abnormality.  Specifically, no fracture, subluxation, or  dislocation. No joint effusion. IMPRESSION: Left knee replacement. No complicating feature. No acute bony abnormality. Electronically Signed   By: Rolm Baptise M.D.   On: 01/30/2019 19:25        Scheduled Meds: . amiodarone  200 mg Oral Daily  . carvedilol  12.5 mg Oral BID  . Chlorhexidine Gluconate Cloth  6 each Topical Q0600  . docusate sodium  200 mg Oral QHS  . ferrous sulfate  325 mg Oral Q breakfast  . levothyroxine  75 mcg Oral QAC breakfast  . sodium chloride flush  3 mL Intravenous Once   Continuous Infusions: . furosemide 100 mg (01/31/19 1630)     LOS: 5 days    Time spent: 35 minutes.     Elmarie Shiley, MD [facility]   If 7PM-7AM, please contact night-coverage [email or web address] Password [facility] 02/01/2019, 7:35 AM

## 2019-02-01 NOTE — Plan of Care (Signed)
  Problem: Activity: Goal: Capacity to carry out activities will improve Outcome: Progressing   Problem: Cardiac: Goal: Ability to achieve and maintain adequate cardiopulmonary perfusion will improve Outcome: Progressing   

## 2019-02-02 ENCOUNTER — Inpatient Hospital Stay (HOSPITAL_COMMUNITY): Payer: Medicare Other

## 2019-02-02 DIAGNOSIS — I5022 Chronic systolic (congestive) heart failure: Secondary | ICD-10-CM

## 2019-02-02 DIAGNOSIS — Z992 Dependence on renal dialysis: Secondary | ICD-10-CM

## 2019-02-02 DIAGNOSIS — N186 End stage renal disease: Secondary | ICD-10-CM

## 2019-02-02 DIAGNOSIS — Z01810 Encounter for preprocedural cardiovascular examination: Secondary | ICD-10-CM

## 2019-02-02 DIAGNOSIS — R5381 Other malaise: Secondary | ICD-10-CM

## 2019-02-02 LAB — RENAL FUNCTION PANEL
Albumin: 2.9 g/dL — ABNORMAL LOW (ref 3.5–5.0)
Anion gap: 11 (ref 5–15)
BUN: 75 mg/dL — ABNORMAL HIGH (ref 8–23)
CO2: 30 mmol/L (ref 22–32)
Calcium: 8.4 mg/dL — ABNORMAL LOW (ref 8.9–10.3)
Chloride: 97 mmol/L — ABNORMAL LOW (ref 98–111)
Creatinine, Ser: 2.82 mg/dL — ABNORMAL HIGH (ref 0.44–1.00)
GFR calc Af Amer: 18 mL/min — ABNORMAL LOW (ref >60)
GFR calc non Af Amer: 15 mL/min — ABNORMAL LOW (ref >60)
Glucose, Bld: 102 mg/dL — ABNORMAL HIGH (ref 70–99)
Phosphorus: 4.3 mg/dL (ref 2.5–4.6)
Potassium: 3.9 mmol/L (ref 3.5–5.1)
Sodium: 138 mmol/L (ref 135–145)

## 2019-02-02 LAB — HEPATITIS B SURFACE ANTIGEN: Hepatitis B Surface Ag: NONREACTIVE

## 2019-02-02 LAB — MAGNESIUM: Magnesium: 2.2 mg/dL (ref 1.7–2.4)

## 2019-02-02 LAB — HEPATITIS B SURFACE ANTIBODY,QUALITATIVE: Hep B S Ab: NONREACTIVE

## 2019-02-02 LAB — HEPATITIS B CORE ANTIBODY, IGM: Hep B C IgM: NONREACTIVE

## 2019-02-02 MED ORDER — HEPARIN (PORCINE) 25000 UT/250ML-% IV SOLN
1250.0000 [IU]/h | INTRAVENOUS | Status: DC
  Administered 2019-02-02: 1100 [IU]/h via INTRAVENOUS
  Administered 2019-02-03: 1250 [IU]/h via INTRAVENOUS
  Filled 2019-02-02 (×2): qty 250

## 2019-02-02 NOTE — Progress Notes (Signed)
ANTICOAGULATION CONSULT NOTE - Initial Consult  Pharmacy Consult for Eliquis to heparin Indication: atrial fibrillation  Allergies  Allergen Reactions  . Bactrim [Sulfamethoxazole-Trimethoprim] Other (See Comments)    Flu like symptoms  . Cephalexin Rash    REACTION: Rash to arms/legs    Patient Measurements: Height: 5\' 4"  (162.6 cm) Weight: 217 lb 9.5 oz (98.7 kg) IBW/kg (Calculated) : 54.7 Heparin Dosing Weight: 79kg  Vital Signs: Temp: 98.8 F (37.1 C) (12/01 1203) Temp Source: Oral (12/01 1203) BP: 118/70 (12/01 1203) Pulse Rate: 71 (12/01 1203)  Labs: Recent Labs    01/31/19 0441 02/01/19 0505 02/02/19 0548  HGB 10.2* 10.4*  --   HCT 32.7* 32.9*  --   PLT 184 193  --   LABPROT  --  16.6*  --   INR  --  1.4*  --   CREATININE 3.56* 3.48* 2.82*    Estimated Creatinine Clearance: 18.8 mL/min (A) (by C-G formula based on SCr of 2.82 mg/dL (H)).   Medical History: Past Medical History:  Diagnosis Date  . Acute renal failure [facility])     hospitalized... December 08, 2009... improved with hydration in the hospital  . Anemia    further workup needed... October, 2011.  [name] Aortic insufficiency    mild...echo... June 2 010  . Arthritis    Hips/Knees, severe, requiring a walker  . Asymmetric septal hypertrophy (HCC)    echo....04/2009....patient encouraged the family to be screened elsewhere  . Atrial fibrillation ([place])     ??? atrial fibrillation during hospitalization October, 2011 ???..  . Bradycardia   . CAD (coronary artery disease)    Mild coronary disease, catheterization, 2009  . Cardiomyopathy, nonischemic ([place])    EF improved October, 2011  . CHF (congestive heart failure) ([place])   . Ejection fraction    EF 25%...nondiagnostic MRI December 2009...  /  echo June, 2010  /   echo... December 08, 2009.... ejection fraction improved... EF 60% /  EF 30%... echo.... [place]... December 28, 2009 with CHFPLanned ICD / CRT... patient has seen Dr.Klein..EF improved  October, 2011   . Groin hematoma    Right-hematoma/abscess..repaired.. December, 2009  . Hypertension   . LBBB (left bundle branch block)   . LVH (left ventricular hypertrophy)    moderately severe... echo.. June, 2010  /  EF improved October, 2011.... cancel plans for ICD  . Mitral regurgitation    mild/ moderate...echo June, 2010  /   echo.. October, 2011.... mitral regurgitation  improved  . OSA (obstructive sleep apnea)   . Presence of permanent cardiac pacemaker   . Renal artery stenosis (HCC)    80% upper left   . Systolic heart failure    chronic     Assessment: 22 yoF admitted with progressive CHF and CKD. Pt is on Eliquis PTA for hx PAF which was resumed 11/30 after holding for temporary HD access. VVS now consulted for permanent access, pharmacy asked to start heparin bridge. Last dose of Eliquis was tonight ~0900.  Goal of Therapy:  Heparin level 0.3-0.7 units/ml aPTT 66-102 seconds Monitor platelets by anticoagulation protocol: Yes   Plan:  -Stop Eliquis -Begin heparin 1100 units/h no bolus tonight at 2100 -Check 8hr aPTT and heparin level (will likely need to dose via aPTT)  Arrie Senate, PharmD, BCPS Clinical Pharmacist [phone] Please check AMION for all [place] numbers 02/02/2019

## 2019-02-02 NOTE — Progress Notes (Addendum)
Patient ID: Gina [name], female   DOB: 1940-08-19, 78 y.o.   MRN: [id number]     Advanced Heart Failure Rounding Note  PCP-Cardiologist: Carlyle Dolly, MD   Subjective:   Started HD 11/30  Denies SOB. Denies nausea.   Objective:   Weight Range: 96.1 kg Body mass index is 36.37 kg/m.   Vital Signs:   Temp:  [97.7 F (36.5 C)-98.4 F (36.9 C)] 98.4 F (36.9 C) (12/01 0441) Pulse Rate:  [70-110] 76 (12/01 0441) Resp:  [17-32] 20 (12/01 0441) BP: (91-129)/(5-73) 98/65 (12/01 0441) SpO2:  [88 %-100 %] 93 % (12/01 0441) Weight:  [96.1 kg-96.8 kg] 96.1 kg (11/30 1817) Last BM Date: 02/01/19  Weight change: Filed Weights   02/01/19 0425 02/01/19 1610 02/01/19 1817  Weight: 99.1 kg 96.8 kg 96.1 kg    Intake/Output:   Intake/Output Summary (Last 24 hours) at 02/02/2019 0724 Last data filed at 02/02/2019 0600 Gross per 24 hour  Intake 740 ml  Output 1400 ml  Net -660 ml      Physical Exam    General:  No resp difficulty HEENT: normal Neck: supple. 8-9. Carotids 2+ bilat; no bruits. No lymphadenopathy or thryomegaly appreciated. Cor: PMI nondisplaced. Regular rate & rhythm. No rubs, gallops or murmurs. R u pper chest tunneled catheter  Lungs: clear Abdomen: soft, nontender, nondistended. No hepatosplenomegaly. No bruits or masses. Good bowel sounds. Extremities: no cyanosis, clubbing, rash, edema Neuro: alert & orientedx3, cranial nerves grossly intact. moves all 4 extremities w/o difficulty. Affect pleasant   Telemetry   A fib V pacing 70s   Labs    CBC Recent Labs    01/31/19 0441 02/01/19 0505  WBC 5.8 7.0  NEUTROABS 4.5  --   HGB 10.2* 10.4*  HCT 32.7* 32.9*  MCV 108.3* 106.5*  PLT 184 0000000   Basic Metabolic Panel Recent Labs    01/31/19 0441 01/31/19 1340 02/01/19 0505  NA 141  --  138  K 3.2* 4.2 4.0  CL 95*  --  96*  CO2 32  --  27  GLUCOSE 91  --  95  BUN 113*  --  112*  CREATININE 3.56*  --  3.48*  CALCIUM 8.4*  --  8.1*  MG 2.2   --  2.2  PHOS 4.2  --  4.5   Liver Function Tests Recent Labs    01/31/19 0441 02/01/19 0505  ALBUMIN 3.0* 2.9*   No results for input(s): LIPASE, AMYLASE in the last 72 hours. Cardiac Enzymes No results for input(s): CKTOTAL, CKMB, CKMBINDEX, TROPONINI in the last 72 hours.  BNP: BNP (last 3 results) Recent Labs    01/27/19 1235  BNP >4,500.0*    ProBNP (last 3 results) No results for input(s): PROBNP in the last 8760 hours.   D-Dimer No results for input(s): DDIMER in the last 72 hours. Hemoglobin A1C No results for input(s): HGBA1C in the last 72 hours. Fasting Lipid Panel No results for input(s): CHOL, HDL, LDLCALC, TRIG, CHOLHDL, LDLDIRECT in the last 72 hours. Thyroid Function Tests No results for input(s): TSH, T4TOTAL, T3FREE, THYROIDAB in the last 72 hours.  Invalid input(s): FREET3  Other results:   Imaging    Ir Fluoro Guide Cv Line Right  Result Date: 02/01/2019 CLINICAL DATA:  Renal failure, needs durable venous access for hemodialysis EXAM: TUNNELED HEMODIALYSIS CATHETER PLACEMENT WITH ULTRASOUND AND FLUOROSCOPIC GUIDANCE TECHNIQUE: The procedure, risks, benefits, and alternatives were explained to the patient. Questions regarding the procedure were encouraged and  answered. The patient understands and consents to the procedure. As antibiotic prophylaxis, vancomycin 1 g was ordered pre-procedure and administered intravenously within one hour of incision. Patency of the right IJ vein was confirmed with ultrasound with image documentation. An appropriate skin site was determined. Region was prepped using maximum barrier technique including cap and mask, sterile gown, sterile gloves, large sterile sheet, and Chlorhexidine as cutaneous antisepsis. The region was infiltrated locally with 1% lidocaine. Intravenous Fentanyl 43mcg and Versed 1mg  were administered as conscious sedation during continuous monitoring of the patient's level of consciousness and  physiological / cardiorespiratory status by the radiology RN, with a total moderate sedation time of 13 minutes. Under real-time ultrasound guidance, the right IJ vein was accessed with a 21 gauge micropuncture needle; the needle tip within the vein was confirmed with ultrasound image documentation. Needle exchanged over the 018 guidewire for transitional dilator, which allowed advancement of a Benson wire into the IVC. Over this, an MPA catheter was advanced. A Palindrome 19 hemodialysis catheter was tunneled from the right anterior chest wall approach to the right IJ dermatotomy site. The MPA catheter was exchanged over an Amplatz wire for serial vascular dilators which allow placement of a peel-away sheath, through which the catheter was advanced under intermittent fluoroscopy, positioned with its tips in the proximal and midright atrium. Spot chest radiograph confirms good catheter position. No pneumothorax. Catheter was flushed and primed per protocol. Catheter secured externally with O Prolene sutures. The right IJ dermatotomy site was closed with Dermabond. COMPLICATIONS: COMPLICATIONS None immediate FLUOROSCOPY TIME:  0.2 minute; 39  uGym2 DAP COMPARISON:  None IMPRESSION: 1. Technically successful placement of tunneled right IJ hemodialysis catheter with ultrasound and fluoroscopic guidance. Ready for routine use. ACCESS: Remains approachable for percutaneous intervention as needed. Electronically Signed   By: Lucrezia Europe M.D.   On: 02/01/2019 11:02   Ir US Guide Vasc Access Right  Result Date: 02/01/2019 CLINICAL DATA:  Renal failure, needs durable venous access for hemodialysis EXAM: TUNNELED HEMODIALYSIS CATHETER PLACEMENT WITH ULTRASOUND AND FLUOROSCOPIC GUIDANCE TECHNIQUE: The procedure, risks, benefits, and alternatives were explained to the patient. Questions regarding the procedure were encouraged and answered. The patient understands and consents to the procedure. As antibiotic prophylaxis,  vancomycin 1 g was ordered pre-procedure and administered intravenously within one hour of incision. Patency of the right IJ vein was confirmed with ultrasound with image documentation. An appropriate skin site was determined. Region was prepped using maximum barrier technique including cap and mask, sterile gown, sterile gloves, large sterile sheet, and Chlorhexidine as cutaneous antisepsis. The region was infiltrated locally with 1% lidocaine. Intravenous Fentanyl 87mcg and Versed 1mg  were administered as conscious sedation during continuous monitoring of the patient's level of consciousness and physiological / cardiorespiratory status by the radiology RN, with a total moderate sedation time of 13 minutes. Under real-time ultrasound guidance, the right IJ vein was accessed with a 21 gauge micropuncture needle; the needle tip within the vein was confirmed with ultrasound image documentation. Needle exchanged over the 018 guidewire for transitional dilator, which allowed advancement of a Benson wire into the IVC. Over this, an MPA catheter was advanced. A Palindrome 19 hemodialysis catheter was tunneled from the right anterior chest wall approach to the right IJ dermatotomy site. The MPA catheter was exchanged over an Amplatz wire for serial vascular dilators which allow placement of a peel-away sheath, through which the catheter was advanced under intermittent fluoroscopy, positioned with its tips in the proximal and midright atrium. Spot chest  radiograph confirms good catheter position. No pneumothorax. Catheter was flushed and primed per protocol. Catheter secured externally with O Prolene sutures. The right IJ dermatotomy site was closed with Dermabond. COMPLICATIONS: COMPLICATIONS None immediate FLUOROSCOPY TIME:  0.2 minute; 39  uGym2 DAP COMPARISON:  None IMPRESSION: 1. Technically successful placement of tunneled right IJ hemodialysis catheter with ultrasound and fluoroscopic guidance. Ready for routine use.  ACCESS: Remains approachable for percutaneous intervention as needed. Electronically Signed   By: Lucrezia Europe M.D.   On: 02/01/2019 11:02     Medications:     Scheduled Medications:  amiodarone  200 mg Oral Daily   apixaban  5 mg Oral BID   carvedilol  12.5 mg Oral BID   Chlorhexidine Gluconate Cloth  6 each Topical Q0600   docusate sodium  200 mg Oral QHS   ferrous sulfate  325 mg Oral Q breakfast   levothyroxine  75 mcg Oral QAC breakfast   sodium chloride flush  3 mL Intravenous Once    Infusions:  vancomycin      PRN Medications: acetaminophen **OR** acetaminophen, Muscle Rub, ondansetron **OR** ondansetron (ZOFRAN) IV   Assessment/Plan   1. Acute on chronic Systolic CHF: Echo (A999333) with EF 20%. Long-standing cardiomyopathy, nonischemic based on cath in 2017. 7/19 hospitalization withlow output HF likely triggered by atrial fibrillation/RVR.  She was started on milrinone.  DCCVs failed and she ultimately had AV nodal ablation with St Jude PPM placed (unable to place LV lead).  Echo in 6/20 showed EF 20%.  Re-attempted LV lead in 10/20, unable to place due to occluded left subclavian vein.  Recently, she has had worsening renal function and has gained 20 lbs, admitted with CHF exacerbation and AKI on CKD stage 5, BUN very high. Started HD 02/01/19    - Continue coreg 12.5 mg twice a day for now. May need to cut back with soft BP.  - No ACEI/ARNI/ARB/spironolactone with elevated creatinine.  - She has a high percentage of RV pacing, have failed LV lead placement so far.  Discussed with Dr. Lovena Le => looks like she is likely to end up needing HD (do not think that BiV pacing would prevent HD at this point), and re-instrumenting her in the setting of a dialysis catheter would have high risk of infection.   Started HD , then down the road when she has a functional AV fistula, will bring her back for removal of atrial lead and placement of LV lead.  2. AKI on CKD stage  5 Tunneled cath placed 11/30.  - Nephrology started HD 02/01/19    3. Atrial fibrillation: Chronic.   She is now s/p AV nodal ablation with St Jude PPM.  -Continue Eliquis 5 mg twice a day 4. OSA - Continue CPAP qHS. 5. PVCs: She is on amiodarone for frequent PVCs (?contributing to cardiomyopathy).  Zio patch in 4/20 on amiodarone showed only rare PVCs.  - Continue amiodarone 200 mg daily.    6. Mitral regurgitation: moderate on 6/20 echo.   Length of Stay: [place], NP  02/02/2019, 7:24 AM  Advanced Heart Failure Team Pager [phone] (M-F; 7a - 4p)  Please contact [facility] for night-coverage after hours (4p -7a ) and weekends on [email or web address]  Patient seen with NP, agree with the above note. She tolerated 1st HD session yesterday without problems.  As above, will need re-attempt at LV lead placement but need to be dialyzing via fistula prior to lower risk of infection, would be  good if AVG/AVF placement could be expedited.  We will follow as outpatient and sign off at this time, will arrange followup.  Call with questions.   [name] 02/02/2019 8:36 AM

## 2019-02-02 NOTE — Progress Notes (Signed)
PROGRESS NOTE    Gina [name]  [id number] DOB: 1940-06-12 DOA: 01/27/2019 PCP: Glenda Chroman, MD   Brief Narrative: 78 year old with past medical history significant for chronic systolic heart failure due to nonischemic cardiomyopathy with ejection fraction of 20%, left bundle branch block, a stage V chronic kidney disease, A. fib status post AV nodal ablation s/p  [facility] PPM in 2019 on Eliquis, anemia of chronic disease, hypertension, renal artery stenosis, obstructive sleep apnea on CPAP who presents to the emergency department complaining of worsening shortness of breath for 3 weeks prior to admission.  Patient was evaluated at the heart failure clinic the morning of admission and was referred to the ED for admission.  Patient reported weight gain, shortness of breath on exertion.  She has been compliant with her diuretics.   Patient was admitted for acute on chronic systolic heart failure exacerbation.  She was a started on IV Lexis.  Nephrology was consulted as the patient is nearing initiation of dialysis.  Patient underwent tunnel catheter placement on 02/01/2019, with the plan of initiation hemodialysis.  Plan is for removal by cardiology of atrial -lead for PPM with placement of LV lead after fistula is mature and in a stable use. Patient has not been out of bed entire hospitalization so will need PT eval.   Assessment & Plan:   Principal Problem:   Acute on chronic congestive heart failure (HCC) Active Problems:   Hypertension   Mitral regurgitation   Anemia of chronic disease   OSA (obstructive sleep apnea)   Atrial fibrillation with RVR (HCC)   Pacemaker   Hypothyroidism   CKD (chronic kidney disease) stage 5, GFR less than 15 ml/min (HCC)   PVC (premature ventricular contraction)   Pressure injury of skin   Acute on chronic systolic congestive heart failure exacerbation: -Cardiology and nephrology has been helping with patient care during this  admission. -Patient was treated with IV lasix 100 mg IV BID. Lasix has been discontinue, now that patient will start HD -An attempt was made by cardiology on 12/2018 to place an LV lead in the patient with removal of lateral leads to improve her output.  This was unsuccessful due to upper extremity thrombosis.  The plan now is for the patient to return for this procedure after her AV fistula is mature and  stable -overall negative  -started on HD 11/30  Stage V chronic kidney disease, progression of end-stage renal disease: -Nephrology was consulted -Underwent tunnel catheter placement on 11/30. -HD started 11/30 Hyporkalemia: Resolved  left knee pain: Knee erythematosus swollen.  Patient was evaluated by orthopedic who is recommending local Aspercreme.  Weightbearing as tolerated.  Follow-up with Dr. Benjaman Kindler in 2 to 4 weeks.  A. fib with RVR: Eliquis was held for tunneled catheter placement.  Eliquis resumed today.  Continue with carvedilol. -amiodarone  Anemia of chronic disease: With mild iron deficiency anemia: Monitor hemoglobin.  Hypothyroidism: Continue with levothyroxine  Obstructive  sleep apnea:  -Continue with CPAP   Pressure Injury 01/27/19 Buttocks Right;Left Stage I -  Intact skin with non-blanchable redness of a localized area usually over a bony prominence. (Active)  01/27/19 1700  Location: Buttocks  Location Orientation: Right;Left  Staging: Stage I -  Intact skin with non-blanchable redness of a localized area usually over a bony prominence.  Wound Description (Comments):   Present on Admission: Yes   Deconditioning -PT eval  obesity Estimated body mass index is 37.35 kg/m as calculated from the following:  Height as of this encounter: 5\' 4"  (1.626 m).   Weight as of this encounter: 98.7 kg.   DVT prophylaxis: Eliquis Code Status: Full code Family Communication: patient Disposition Plan: needs PT and out of bed  Consultants:    Cardiology  Nephrology  Procedures:   Hemodialysis tunnel catheter placement    Subjective: Has not been out of bed Very weak  Objective: Vitals:   02/01/19 1931 02/02/19 0043 02/02/19 0441 02/02/19 0834  BP: 101/62 112/68 98/65 103/61  Pulse: 73 72 76 73  Resp: 20 20 20 18   Temp: 98.2 F (36.8 C) 98.2 F (36.8 C) 98.4 F (36.9 C) 98.1 F (36.7 C)  TempSrc: Oral Oral Oral Oral  SpO2: 99%  93% 100%  Weight:   98.7 kg   Height:        Intake/Output Summary (Last 24 hours) at 02/02/2019 1107 Last data filed at 02/02/2019 0600 Gross per 24 hour  Intake 480 ml  Output 1400 ml  Net -920 ml   Filed Weights   02/01/19 1610 02/01/19 1817 02/02/19 0441  Weight: 96.8 kg 96.1 kg 98.7 kg    Examination:  General exam: in bed, NAD Respiratory system: no increased work of breathing, no wheezing Cardiovascular system: rrr Gastrointestinal system: +BS, soft, NT Central nervous system: alert and oriented Extremities: moves all 4 ext   Data Reviewed: I have personally reviewed following labs and imaging studies  CBC: Recent Labs  Lab 01/27/19 1233 01/27/19 1652 01/28/19 0237 01/31/19 0441 02/01/19 0505  WBC 6.3 5.5 5.2 5.8 7.0  NEUTROABS  --   --   --  4.5  --   HGB 10.5* 10.1* 9.5* 10.2* 10.4*  HCT 33.9* 32.5* 30.4* 32.7* 32.9*  MCV 108.0* 107.3* 108.2* 108.3* 106.5*  PLT 253 234 221 184 0000000   Basic Metabolic Panel: Recent Labs  Lab 01/27/19 1652  01/29/19 0406  01/30/19 0448 01/30/19 1828 01/31/19 0441 01/31/19 1340 02/01/19 0505 02/02/19 0548  NA  --    < > 142  --  142 139 141  --  138 138  K  --    < > 2.9*   < > 3.9 3.5 3.2* 4.2 4.0 3.9  CL  --    < > 95*  --  97* 95* 95*  --  96* 97*  CO2  --    < > 30  --  29 30 32  --  27 30  GLUCOSE  --    < > 105*  --  102* 146* 91  --  95 102*  BUN  --    < > 124*  --  120* 115* 113*  --  112* 75*  CREATININE 4.36*   < > 4.25*  --  4.08* 3.74* 3.56*  --  3.48* 2.82*  CALCIUM  --    < > 9.0  --  8.7*  8.3* 8.4*  --  8.1* 8.4*  MG 2.6*  --   --   --  2.3  --  2.2  --  2.2 2.2  PHOS 5.0*  --  4.8*  --  4.1  --  4.2  --  4.5 4.3   < > = values in this interval not displayed.   GFR: Estimated Creatinine Clearance: 18.8 mL/min (A) (by C-G formula based on SCr of 2.82 mg/dL (H)). Liver Function Tests: Recent Labs  Lab 01/27/19 1652 01/28/19 0237 01/29/19 0406 01/30/19 0448 01/31/19 0441 02/01/19 0505 02/02/19 0548  AST 47* 41  --   --   --   --   --  ALT 103* 99*  --   --   --   --   --   ALKPHOS 69 66  --   --   --   --   --   BILITOT 1.7* 1.6*  --   --   --   --   --   PROT 6.4* 5.7*  --   --   --   --   --   ALBUMIN 3.5 3.3* 3.2* 3.1* 3.0* 2.9* 2.9*   No results for input(s): LIPASE, AMYLASE in the last 168 hours. No results for input(s): AMMONIA in the last 168 hours. Coagulation Profile: Recent Labs  Lab 01/27/19 1233 02/01/19 0505  INR 2.5* 1.4*   Cardiac Enzymes: No results for input(s): CKTOTAL, CKMB, CKMBINDEX, TROPONINI in the last 168 hours. BNP (last 3 results) No results for input(s): PROBNP in the last 8760 hours. HbA1C: No results for input(s): HGBA1C in the last 72 hours. CBG: No results for input(s): GLUCAP in the last 168 hours. Lipid Profile: No results for input(s): CHOL, HDL, LDLCALC, TRIG, CHOLHDL, LDLDIRECT in the last 72 hours. Thyroid Function Tests: No results for input(s): TSH, T4TOTAL, FREET4, T3FREE, THYROIDAB in the last 72 hours. Anemia Panel: No results for input(s): VITAMINB12, FOLATE, FERRITIN, TIBC, IRON, RETICCTPCT in the last 72 hours. Sepsis Labs: No results for input(s): PROCALCITON, LATICACIDVEN in the last 168 hours.  Recent Results (from the past 240 hour(s))  SARS CORONAVIRUS 2 (TAT 6-24 HRS) Nasopharyngeal Nasopharyngeal Swab     Status: None   Collection Time: 01/27/19  2:53 PM   Specimen: Nasopharyngeal Swab  Result Value Ref Range Status   SARS Coronavirus 2 NEGATIVE NEGATIVE Final    Comment: (NOTE) SARS-CoV-2  target nucleic acids are NOT DETECTED. The SARS-CoV-2 RNA is generally detectable in upper and lower respiratory specimens during the acute phase of infection. Negative results do not preclude SARS-CoV-2 infection, do not rule out co-infections with other pathogens, and should not be used as the sole basis for treatment or other patient management decisions. Negative results must be combined with clinical observations, patient history, and epidemiological information. The expected result is Negative. Fact Sheet for Patients: SugarRoll.be Fact Sheet for Healthcare Providers: https://www.woods-mathews.com/ This test is not yet approved or cleared by the [place] FDA and  has been authorized for detection and/or diagnosis of SARS-CoV-2 by FDA under an Emergency Use Authorization (EUA). This EUA will remain  in effect (meaning this test can be used) for the duration of the COVID-19 declaration under Section 56 4(b)(1) of the Act, 21 U.S.C. section 360bbb-3(b)(1), unless the authorization is terminated or revoked sooner. Performed at [facility], [place] [address] [place], [place] [address]          Radiology Studies: Ir Fluoro Guide Cv Line Right  Result Date: 02/01/2019 CLINICAL DATA:  Renal failure, needs durable venous access for hemodialysis EXAM: TUNNELED HEMODIALYSIS CATHETER PLACEMENT WITH ULTRASOUND AND FLUOROSCOPIC GUIDANCE TECHNIQUE: The procedure, risks, benefits, and alternatives were explained to the patient. Questions regarding the procedure were encouraged and answered. The patient understands and consents to the procedure. As antibiotic prophylaxis, vancomycin 1 g was ordered pre-procedure and administered intravenously within one hour of incision. Patency of the right IJ vein was confirmed with ultrasound with image documentation. An appropriate skin site was determined. Region was prepped using maximum barrier technique  including cap and mask, sterile gown, sterile gloves, large sterile sheet, and Chlorhexidine as cutaneous antisepsis. The region was infiltrated locally with  1% lidocaine. Intravenous Fentanyl 39mcg and Versed 1mg  were administered as conscious sedation during continuous monitoring of the patient's level of consciousness and physiological / cardiorespiratory status by the radiology RN, with a total moderate sedation time of 13 minutes. Under real-time ultrasound guidance, the right IJ vein was accessed with a 21 gauge micropuncture needle; the needle tip within the vein was confirmed with ultrasound image documentation. Needle exchanged over the 018 guidewire for transitional dilator, which allowed advancement of a Benson wire into the IVC. Over this, an MPA catheter was advanced. A Palindrome 19 hemodialysis catheter was tunneled from the right anterior chest wall approach to the right IJ dermatotomy site. The MPA catheter was exchanged over an Amplatz wire for serial vascular dilators which allow placement of a peel-away sheath, through which the catheter was advanced under intermittent fluoroscopy, positioned with its tips in the proximal and midright atrium. Spot chest radiograph confirms good catheter position. No pneumothorax. Catheter was flushed and primed per protocol. Catheter secured externally with O Prolene sutures. The right IJ dermatotomy site was closed with Dermabond. COMPLICATIONS: COMPLICATIONS None immediate FLUOROSCOPY TIME:  0.2 minute; 39  uGym2 DAP COMPARISON:  None IMPRESSION: 1. Technically successful placement of tunneled right IJ hemodialysis catheter with ultrasound and fluoroscopic guidance. Ready for routine use. ACCESS: Remains approachable for percutaneous intervention as needed. Electronically Signed   By: Lucrezia Europe M.D.   On: 02/01/2019 11:02   Ir US Guide Vasc Access Right  Result Date: 02/01/2019 CLINICAL DATA:  Renal failure, needs durable venous access for hemodialysis  EXAM: TUNNELED HEMODIALYSIS CATHETER PLACEMENT WITH ULTRASOUND AND FLUOROSCOPIC GUIDANCE TECHNIQUE: The procedure, risks, benefits, and alternatives were explained to the patient. Questions regarding the procedure were encouraged and answered. The patient understands and consents to the procedure. As antibiotic prophylaxis, vancomycin 1 g was ordered pre-procedure and administered intravenously within one hour of incision. Patency of the right IJ vein was confirmed with ultrasound with image documentation. An appropriate skin site was determined. Region was prepped using maximum barrier technique including cap and mask, sterile gown, sterile gloves, large sterile sheet, and Chlorhexidine as cutaneous antisepsis. The region was infiltrated locally with 1% lidocaine. Intravenous Fentanyl 68mcg and Versed 1mg  were administered as conscious sedation during continuous monitoring of the patient's level of consciousness and physiological / cardiorespiratory status by the radiology RN, with a total moderate sedation time of 13 minutes. Under real-time ultrasound guidance, the right IJ vein was accessed with a 21 gauge micropuncture needle; the needle tip within the vein was confirmed with ultrasound image documentation. Needle exchanged over the 018 guidewire for transitional dilator, which allowed advancement of a Benson wire into the IVC. Over this, an MPA catheter was advanced. A Palindrome 19 hemodialysis catheter was tunneled from the right anterior chest wall approach to the right IJ dermatotomy site. The MPA catheter was exchanged over an Amplatz wire for serial vascular dilators which allow placement of a peel-away sheath, through which the catheter was advanced under intermittent fluoroscopy, positioned with its tips in the proximal and midright atrium. Spot chest radiograph confirms good catheter position. No pneumothorax. Catheter was flushed and primed per protocol. Catheter secured externally with O Prolene  sutures. The right IJ dermatotomy site was closed with Dermabond. COMPLICATIONS: COMPLICATIONS None immediate FLUOROSCOPY TIME:  0.2 minute; 39  uGym2 DAP COMPARISON:  None IMPRESSION: 1. Technically successful placement of tunneled right IJ hemodialysis catheter with ultrasound and fluoroscopic guidance. Ready for routine use. ACCESS: Remains approachable for percutaneous intervention as needed.  Electronically Signed   By: Lucrezia Europe M.D.   On: 02/01/2019 11:02        Scheduled Meds:  amiodarone  200 mg Oral Daily   apixaban  5 mg Oral BID   carvedilol  12.5 mg Oral BID   Chlorhexidine Gluconate Cloth  6 each Topical Q0600   docusate sodium  200 mg Oral QHS   ferrous sulfate  325 mg Oral Q breakfast   levothyroxine  75 mcg Oral QAC breakfast   sodium chloride flush  3 mL Intravenous Once   Continuous Infusions:  vancomycin       LOS: 6 days    Time spent: 35 minutes.     Geradine Girt, DO [facility]   If 7PM-7AM, please contact night-coverage [email or web address] Password [email or web address] 02/02/2019, 11:07 AM

## 2019-02-02 NOTE — Consult Note (Signed)
REASON FOR CONSULT:    To evaluate dialysis access.  The consult is requested by Dr. Janace Litten.   ASSESSMENT & PLAN:   END-STAGE RENAL DISEASE: This patient is consulted for hemodialysis access.  She is on Eliquis and I have written to hold this for now.  I have written to start her on heparin pending her surgery on Thursday.  We can potentially do her surgery on Thursday.  She will need access in the right arm given that she has a pacemaker on the left.  Her vein map is pending.  She has a tunneled dialysis catheter.  I have discussed the indications for the procedure and the potential complications with the patient and she is agreeable to proceed on Thursday.  It looks like she is scheduled for dialysis tomorrow.  Please do not do dialysis on Thursday as she is scheduled for surgery that day.  Thank you.  Deitra Mayo, MD Office: [phone]   HPI:   Gina [name] is a pleasant 78 y.o. female, who was admitted on 01/27/2019 with worsening exertional shortness of breath which has been going on for 3 weeks.  Patient has a history of nonischemic cardiomyopathy with chronic systolic congestive heart failure.  He has a known ejection fraction of 20%.  She has a pacemaker on the left side.  She also has known chronic kidney disease.  She had a tunneled dialysis catheter placed by interventional radiology and we have been asked to proceed with placement of permanent access.  Patient is status post AV node ablation with [facility] Jude PPM.  She is on Eliquis for this reason.  She is right-handed.  She denies any recent uremic symptoms except for significant shortness of breath.   This has improved since her admission.  Past Medical History:  Diagnosis Date  . Acute renal failure [facility])     hospitalized... December 08, 2009... improved with hydration in the hospital  . Anemia    further workup needed... October, 2011.  [name] Aortic insufficiency    mild...echo... June 2 010  . Arthritis    Hips/Knees, severe, requiring a walker  . Asymmetric septal hypertrophy (HCC)    echo....04/2009....patient encouraged the family to be screened elsewhere  . Atrial fibrillation ([place])     ??? atrial fibrillation during hospitalization October, 2011 ???..  . Bradycardia   . CAD (coronary artery disease)    Mild coronary disease, catheterization, 2009  . Cardiomyopathy, nonischemic ([place])    EF improved October, 2011  . CHF (congestive heart failure) ([place])   . Ejection fraction    EF 25%...nondiagnostic MRI December 2009...  /  echo June, 2010  /   echo... December 08, 2009.... ejection fraction improved... EF 60% /  EF 30%... echo.... [place]... December 28, 2009 with CHFPLanned ICD / CRT... patient has seen Dr.Klein..EF improved October, 2011   . Groin hematoma    Right-hematoma/abscess..repaired.. December, 2009  . Hypertension   . LBBB (left bundle branch block)   . LVH (left ventricular hypertrophy)    moderately severe... echo.. June, 2010  /  EF improved October, 2011.... cancel plans for ICD  . Mitral regurgitation    mild/ moderate...echo June, 2010  /   echo.. October, 2011.... mitral regurgitation  improved  . OSA (obstructive sleep apnea)   . Presence of permanent cardiac pacemaker   . Renal artery stenosis (HCC)    80% upper left   . Systolic heart failure    chronic    Family History  Problem Relation Age of Onset  . Rheumatic fever Mother        in her early 34's  . Alzheimer's disease Mother   . Stomach cancer Father 50       died 75  . Bradycardia Brother        has pacemaker  . Heart failure Paternal Grandmother   . Heart failure Paternal Grandfather     SOCIAL HISTORY: Social History   Socioeconomic History  . Marital status: Married    Spouse name: Not on file  . Number of children: 0  . Years of education: Not on file  . Highest education level: Not on file  Occupational History  . Not on file  Social Needs  . Financial resource strain: Not  on file  . Food insecurity    Worry: Not on file    Inability: Not on file  . Transportation needs    Medical: Not on file    Non-medical: Not on file  Tobacco Use  . Smoking status: Former Smoker    Packs/day: 1.00    Years: 24.00    Pack years: 24.00    Types: Cigarettes    Start date: 10/29/1958    Quit date: 12/02/1985    Years since quitting: 33.1  . Smokeless tobacco: Never Used  Substance and Sexual Activity  . Alcohol use: No    Alcohol/week: 0.0 standard drinks  . Drug use: Never  . Sexual activity: Not on file  Lifestyle  . Physical activity    Days per week: Not on file    Minutes per session: Not on file  . Stress: Not on file  Relationships  . Social Herbalist on phone: Not on file    Gets together: Not on file    Attends religious service: Not on file    Active member of club or organization: Not on file    Attends meetings of clubs or organizations: Not on file    Relationship status: Not on file  . Intimate partner violence    Fear of current or ex partner: Not on file    Emotionally abused: Not on file    Physically abused: Not on file    Forced sexual activity: Not on file  Other Topics Concern  . Not on file  Social History Narrative  . Not on file    Allergies  Allergen Reactions  . Bactrim [Sulfamethoxazole-Trimethoprim] Other (See Comments)    Flu like symptoms  . Cephalexin Rash    REACTION: Rash to arms/legs    Current Facility-Administered Medications  Medication Dose Route Frequency Provider Last Rate Last Dose  . acetaminophen (TYLENOL) tablet 650 mg  650 mg Oral Q6H PRN Pahwani, Rinka R, MD   650 mg at 02/01/19 2130   Or  . acetaminophen (TYLENOL) suppository 650 mg  650 mg Rectal Q6H PRN Pahwani, Rinka R, MD      . amiodarone (PACERONE) tablet 200 mg  200 mg Oral Daily Pahwani, Rinka R, MD   200 mg at 02/02/19 0855  . apixaban (ELIQUIS) tablet 5 mg  5 mg Oral BID Larey Dresser, MD   5 mg at 02/02/19 0857  .  carvedilol (COREG) tablet 12.5 mg  12.5 mg Oral BID Pahwani, Rinka R, MD   12.5 mg at 02/02/19 0855  . Chlorhexidine Gluconate Cloth 2 % PADS 6 each  6 each Topical Q0600 Claudia Desanctis, MD   6 each at 02/01/19 0700  .  docusate sodium (COLACE) capsule 200 mg  200 mg Oral QHS Swayze, Ava, DO   200 mg at 02/01/19 2130  . ferrous sulfate tablet 325 mg  325 mg Oral Q breakfast Pahwani, Rinka R, MD   325 mg at 02/02/19 0855  . levothyroxine (SYNTHROID) tablet 75 mcg  75 mcg Oral QAC breakfast Pahwani, Rinka R, MD   75 mcg at 02/02/19 0629  . Muscle Rub CREA   Topical BID PRN Regalado, Belkys A, MD      . ondansetron (ZOFRAN) tablet 4 mg  4 mg Oral Q6H PRN Pahwani, Rinka R, MD       Or  . ondansetron (ZOFRAN) injection 4 mg  4 mg Intravenous Q6H PRN Pahwani, Rinka R, MD      . sodium chloride flush (NS) 0.9 % injection 3 mL  3 mL Intravenous Once Dorie Rank, MD      . vancomycin (VANCOCIN) IVPB 1000 mg/200 mL premix  1,000 mg Intravenous Once Arne Cleveland, MD        REVIEW OF SYSTEMS:  [X]  denotes positive finding, [ ]  denotes negative finding Cardiac  Comments:  Chest pain or chest pressure:    Shortness of breath upon exertion: x   Short of breath when lying flat: x   Irregular heart rhythm:        Vascular    Pain in calf, thigh, or hip brought on by ambulation:    Pain in feet at night that wakes you up from your sleep:     Blood clot in your veins:    Leg swelling:         Pulmonary    Oxygen at home:    Productive cough:     Wheezing:         Neurologic    Sudden weakness in arms or legs:     Sudden numbness in arms or legs:     Sudden onset of difficulty speaking or slurred speech:    Temporary loss of vision in one eye:     Problems with dizziness:         Gastrointestinal    Blood in stool:     Vomited blood:         Genitourinary    Burning when urinating:     Blood in urine:        Psychiatric    Major depression:         Hematologic    Bleeding problems:     Problems with blood clotting too easily:        Skin    Rashes or ulcers:        Constitutional    Fever or chills:     PHYSICAL EXAM:   Vitals:   02/02/19 0043 02/02/19 0441 02/02/19 0834 02/02/19 1203  BP: 112/68 98/65 103/61 118/70  Pulse: 72 76 73 71  Resp: 20 20 18 18   Temp: 98.2 F (36.8 C) 98.4 F (36.9 C) 98.1 F (36.7 C) 98.8 F (37.1 C)  TempSrc: Oral Oral Oral Oral  SpO2:  93% 100% 100%  Weight:  98.7 kg    Height:        GENERAL: The patient is a well-nourished female, in no acute distress. The vital signs are documented above. CARDIAC: There is a regular rate and rhythm.  VASCULAR: I do not detect carotid bruits. She has palpable radial pulses. PULMONARY: There is good air exchange bilaterally without wheezing or rales. ABDOMEN: Soft and non-tender  with normal pitched bowel sounds.  MUSCULOSKELETAL: There are no major deformities or cyanosis. NEUROLOGIC: No focal weakness or paresthesias are detected. SKIN: She has a small skin tear on her right forearm.  She has an IV in her left arm. PSYCHIATRIC: The patient has a normal affect.  DATA:    VEIN MAP: The vein map is pending.  LABS: Her potassium today is 3.9.  GFR is 15.

## 2019-02-02 NOTE — TOC Initial Note (Signed)
Transition of Care [facility]) - Initial/Assessment Note    Patient Details  Name: Gina [name] MRN: [id number] Date of Birth: [birth year]  Transition of Care [facility]) CM/SW Contact:    Alberteen Sam, [place] Phone Number: [phone] 02/02/2019, 3:45 PM  Clinical Narrative:                  CSW met with patient and niece Debby Bud at bedside, patient gave CSW permission to speak freely with niece in room. Readmission risk assessment completed. Patient and family informed of SNF recommendation, however would like to work with PT daily with hopes of going home with home health vs SNF. If it is recommended patient go to SNF closer to time of discharge, preference is for [facility] in [place]. Otherwise, patient has preference for [facility], she reports she is no longer active but has used them in the past.   CSW has reached out to PT to inform of patient's wishes to continue to work with them during hospital stay with hopes of going home. Patient has large family support system at home.   Expected Discharge Plan: [place] Barriers to Discharge: Continued Medical Work up   Patient Goals and CMS Choice Patient states their goals for this hospitalization and ongoing recovery are:: to go home CMS Medicare.gov Compare Post Acute Care list provided to:: Patient    Expected Discharge Plan and Services Expected Discharge Plan: [place]       Living arrangements for the past 2 months: Single Family Home                                      Prior Living Arrangements/Services Living arrangements for the past 2 months: Single Family Home Lives with:: Relatives Patient language and need for interpreter reviewed:: Yes Do you feel safe going back to the place where you live?: Yes      Need for Family Participation in Patient Care: Yes (Comment) Care giver support system in place?: Yes (comment)   Criminal Activity/Legal Involvement  Pertinent to Current Situation/Hospitalization: No - Comment as needed  Activities of Daily Living Home Assistive Devices/Equipment: Cane (specify quad or straight), Walker (specify type), Wheelchair ADL Screening (condition at time of admission) Patient's cognitive ability adequate to safely complete daily activities?: Yes Is the patient deaf or have difficulty hearing?: No Does the patient have difficulty seeing, even when wearing glasses/contacts?: No Does the patient have difficulty concentrating, remembering, or making decisions?: No Patient able to express need for assistance with ADLs?: Yes Does the patient have difficulty dressing or bathing?: No Independently performs ADLs?: Yes (appropriate for developmental age) Does the patient have difficulty walking or climbing stairs?: Yes Weakness of Legs: Both Weakness of Arms/Hands: None  Permission Sought/Granted Permission sought to share information with : Case Manager, Customer service manager, Family Supports Permission granted to share information with : Yes, Verbal Permission Granted  Share Information with NAME: Truman Hayward  Permission granted to share info w AGENCY: [place] granted to share info w Relationship: niece  Permission granted to share info w Contact Information: [phone]  Emotional Assessment Appearance:: Appears stated age Attitude/Demeanor/Rapport: Gracious Affect (typically observed): Calm Orientation: : Oriented to Self, Oriented to Place, Oriented to  Time, Oriented to Situation Alcohol / Substance Use: Not Applicable Psych Involvement: No (comment)  Admission diagnosis:  Acute on chronic congestive  heart failure, unspecified heart failure type [facility]) [I50.9] Patient Active Problem List   Diagnosis Date Noted  . Pressure injury of skin 01/28/2019  . Acute on chronic congestive heart failure ([place]) 01/27/2019  . ESRD (end stage renal disease) ([place]) 01/27/2019  . Hypothyroidism 01/27/2019   . CKD (chronic kidney disease) stage 5, GFR less than 15 ml/min (HCC) 01/27/2019  . PVC (premature ventricular contraction) 01/27/2019  . Pacemaker   . SOB (shortness of breath)   . UTI due to extended-spectrum beta lactamase (ESBL) producing Escherichia coli   . Acute blood loss anemia   . Acute respiratory failure with hypoxia ([place])   . Atrial fibrillation with RVR ([place])   . Pleural effusion, right   . Acute on chronic systolic heart failure ([place])   . Severe left ventricular systolic dysfunction   . Chronic kidney disease (CKD), stage III (moderate)   . CHF exacerbation ([place]) 09/02/2017  . AKI (acute kidney injury) ([place])   . Overweight(278.02) 07/29/2013  . Chronic combined systolic and diastolic CHF (congestive heart failure) ([place]) 12/18/2012  . Coronary artery disease, non-occlusive   . Ejection fraction   . Cardiomyopathy, nonischemic ([place])   . Asymmetric septal hypertrophy (HCC)   . LVH (left ventricular hypertrophy)   . Hypertension   . Mitral regurgitation   . Renal artery stenosis ([place])   . Anemia of chronic disease   . LBBB (left bundle branch block)   . OSA (obstructive sleep apnea)   . Bradycardia   . Atrial fibrillation ([place])   . HYPOKALEMIA 02/14/2010  . Aortic regurgitation 11/21/2008   PCP:  Glenda Chroman, MD Pharmacy:   [facility] [place], [place] [address] [place] [address] Phone: [phone] Fax: [phone]     Social Determinants of Health (SDOH) Interventions    Readmission Risk Interventions Readmission Risk Prevention Plan 02/02/2019  Transportation Screening Complete  PCP or Specialist Appt within 3-5 Days Complete  HRI or [place] Complete  Social Work Consult for [place] Planning/Counseling Complete  Palliative Care Screening Not Applicable  Medication Review Press photographer) Complete  Some recent data might be hidden

## 2019-02-02 NOTE — Progress Notes (Signed)
Bilateral upper extremity vein mapping complete. Please see CV Proc tab for preliminary results. [place], RVT 5:22 PM  02/02/2019

## 2019-02-02 NOTE — Progress Notes (Signed)
30 Day Unplanned Readmission Risk Score      ED to Hosp-Admission (Current) from 01/27/2019 in [place] CHF PCU  30 Day Unplanned Readmission Risk Score (%)  24 Filed at 02/02/2019 1200       This score is the patient's risk of an unplanned readmission within 30 days of being discharged (0 -100%). The score is based on dignosis, age, lab data, medications, orders, and past utilization.   Low:  0-14.9   Medium: 15-21.9   High: 22-29.9   Extreme: 30 and above         Readmission Risk Prevention Plan 02/02/2019  Transportation Screening Complete  PCP or Specialist Appt within 3-5 Days Complete  HRI or [place] Complete  Social Work Consult for [place] Planning/Counseling Complete  Palliative Care Screening Not Applicable  Medication Review Press photographer) Complete  Some recent data might be hidden   [place], [place] [phone]

## 2019-02-02 NOTE — Progress Notes (Signed)
Patient ID: Gina [name], female   DOB: August 07, 1940, 78 y.o.   MRN: [id number] S: Reports that HD was "amazing" but had some difficulties with PT this am and required max assist and unable to stand on her own. O:BP 103/61 (BP Location: Right Arm)   Pulse 73   Temp 98.1 F (36.7 C) (Oral)   Resp 18   Ht 5\' 4"  (1.626 m)   Wt 98.7 kg   SpO2 100%   BMI 37.35 kg/m   Intake/Output Summary (Last 24 hours) at 02/02/2019 0954 Last data filed at 02/02/2019 0600 Gross per 24 hour  Intake 480 ml  Output 1400 ml  Net -920 ml   Intake/Output: I/O last 3 completed shifts: In: 980 [P.O.:720; IV Piggyback:260] Out: 2300 [Urine:1800; Other:500]  Intake/Output this shift:  No intake/output data recorded. Weight change: -2.3 kg Gen: NAD CVS: no rub Resp: occ rhonchi Abd: +BS,soft, NT/ND Ext: trace pretibial edema  Recent Labs  Lab 01/27/19 1652 01/28/19 0237 01/29/19 0406 01/29/19 1122 01/30/19 0448 01/30/19 1828 01/31/19 0441 01/31/19 1340 02/01/19 0505 02/02/19 0548  NA  --  140 142  --  142 139 141  --  138 138  K  --  3.4* 2.9* 3.0* 3.9 3.5 3.2* 4.2 4.0 3.9  CL  --  96* 95*  --  97* 95* 95*  --  96* 97*  CO2  --  27 30  --  29 30 32  --  27 30  GLUCOSE  --  103* 105*  --  102* 146* 91  --  95 102*  BUN  --  126* 124*  --  120* 115* 113*  --  112* 75*  CREATININE 4.36* 4.55* 4.25*  --  4.08* 3.74* 3.56*  --  3.48* 2.82*  ALBUMIN 3.5 3.3* 3.2*  --  3.1*  --  3.0*  --  2.9* 2.9*  CALCIUM  --  9.3 9.0  --  8.7* 8.3* 8.4*  --  8.1* 8.4*  PHOS 5.0*  --  4.8*  --  4.1  --  4.2  --  4.5 4.3  AST 47* 41  --   --   --   --   --   --   --   --   ALT 103* 99*  --   --   --   --   --   --   --   --    Liver Function Tests: Recent Labs  Lab 01/27/19 1652 01/28/19 0237  01/31/19 0441 02/01/19 0505 02/02/19 0548  AST 47* 41  --   --   --   --   ALT 103* 99*  --   --   --   --   ALKPHOS 69 66  --   --   --   --   BILITOT 1.7* 1.6*  --   --   --   --   PROT 6.4* 5.7*  --   --   --    --   ALBUMIN 3.5 3.3*   < > 3.0* 2.9* 2.9*   < > = values in this interval not displayed.   No results for input(s): LIPASE, AMYLASE in the last 168 hours. No results for input(s): AMMONIA in the last 168 hours. CBC: Recent Labs  Lab 01/27/19 1233 01/27/19 1652 01/28/19 0237 01/31/19 0441 02/01/19 0505  WBC 6.3 5.5 5.2 5.8 7.0  NEUTROABS  --   --   --  4.5  --  HGB 10.5* 10.1* 9.5* 10.2* 10.4*  HCT 33.9* 32.5* 30.4* 32.7* 32.9*  MCV 108.0* 107.3* 108.2* 108.3* 106.5*  PLT 253 234 221 184 193   Cardiac Enzymes: No results for input(s): CKTOTAL, CKMB, CKMBINDEX, TROPONINI in the last 168 hours. CBG: No results for input(s): GLUCAP in the last 168 hours.  Iron Studies: No results for input(s): IRON, TIBC, TRANSFERRIN, FERRITIN in the last 72 hours. Studies/Results: Ir Fluoro Guide Cv Line Right  Result Date: 02/01/2019 CLINICAL DATA:  Renal failure, needs durable venous access for hemodialysis EXAM: TUNNELED HEMODIALYSIS CATHETER PLACEMENT WITH ULTRASOUND AND FLUOROSCOPIC GUIDANCE TECHNIQUE: The procedure, risks, benefits, and alternatives were explained to the patient. Questions regarding the procedure were encouraged and answered. The patient understands and consents to the procedure. As antibiotic prophylaxis, vancomycin 1 g was ordered pre-procedure and administered intravenously within one hour of incision. Patency of the right IJ vein was confirmed with ultrasound with image documentation. An appropriate skin site was determined. Region was prepped using maximum barrier technique including cap and mask, sterile gown, sterile gloves, large sterile sheet, and Chlorhexidine as cutaneous antisepsis. The region was infiltrated locally with 1% lidocaine. Intravenous Fentanyl 79mcg and Versed 1mg  were administered as conscious sedation during continuous monitoring of the patient's level of consciousness and physiological / cardiorespiratory status by the radiology RN, with a total  moderate sedation time of 13 minutes. Under real-time ultrasound guidance, the right IJ vein was accessed with a 21 gauge micropuncture needle; the needle tip within the vein was confirmed with ultrasound image documentation. Needle exchanged over the 018 guidewire for transitional dilator, which allowed advancement of a Benson wire into the IVC. Over this, an MPA catheter was advanced. A Palindrome 19 hemodialysis catheter was tunneled from the right anterior chest wall approach to the right IJ dermatotomy site. The MPA catheter was exchanged over an Amplatz wire for serial vascular dilators which allow placement of a peel-away sheath, through which the catheter was advanced under intermittent fluoroscopy, positioned with its tips in the proximal and midright atrium. Spot chest radiograph confirms good catheter position. No pneumothorax. Catheter was flushed and primed per protocol. Catheter secured externally with O Prolene sutures. The right IJ dermatotomy site was closed with Dermabond. COMPLICATIONS: COMPLICATIONS None immediate FLUOROSCOPY TIME:  0.2 minute; 39  uGym2 DAP COMPARISON:  None IMPRESSION: 1. Technically successful placement of tunneled right IJ hemodialysis catheter with ultrasound and fluoroscopic guidance. Ready for routine use. ACCESS: Remains approachable for percutaneous intervention as needed. Electronically Signed   By: Lucrezia Europe M.D.   On: 02/01/2019 11:02   Ir US Guide Vasc Access Right  Result Date: 02/01/2019 CLINICAL DATA:  Renal failure, needs durable venous access for hemodialysis EXAM: TUNNELED HEMODIALYSIS CATHETER PLACEMENT WITH ULTRASOUND AND FLUOROSCOPIC GUIDANCE TECHNIQUE: The procedure, risks, benefits, and alternatives were explained to the patient. Questions regarding the procedure were encouraged and answered. The patient understands and consents to the procedure. As antibiotic prophylaxis, vancomycin 1 g was ordered pre-procedure and administered intravenously within  one hour of incision. Patency of the right IJ vein was confirmed with ultrasound with image documentation. An appropriate skin site was determined. Region was prepped using maximum barrier technique including cap and mask, sterile gown, sterile gloves, large sterile sheet, and Chlorhexidine as cutaneous antisepsis. The region was infiltrated locally with 1% lidocaine. Intravenous Fentanyl 73mcg and Versed 1mg  were administered as conscious sedation during continuous monitoring of the patient's level of consciousness and physiological / cardiorespiratory status by the radiology RN, with a  total moderate sedation time of 13 minutes. Under real-time ultrasound guidance, the right IJ vein was accessed with a 21 gauge micropuncture needle; the needle tip within the vein was confirmed with ultrasound image documentation. Needle exchanged over the 018 guidewire for transitional dilator, which allowed advancement of a [place] wire into the IVC. Over this, an MPA catheter was advanced. A Palindrome 19 hemodialysis catheter was tunneled from the right anterior chest wall approach to the right IJ dermatotomy site. The MPA catheter was exchanged over an Amplatz wire for serial vascular dilators which allow placement of a peel-away sheath, through which the catheter was advanced under intermittent fluoroscopy, positioned with its tips in the proximal and midright atrium. Spot chest radiograph confirms good catheter position. No pneumothorax. Catheter was flushed and primed per protocol. Catheter secured externally with O Prolene sutures. The right IJ dermatotomy site was closed with Dermabond. COMPLICATIONS: COMPLICATIONS None immediate FLUOROSCOPY TIME:  0.2 minute; 39  uGym2 DAP COMPARISON:  None IMPRESSION: 1. Technically successful placement of tunneled right IJ hemodialysis catheter with ultrasound and fluoroscopic guidance. Ready for routine use. ACCESS: Remains approachable for percutaneous intervention as needed.  Electronically Signed   By: Lucrezia Europe M.D.   On: 02/01/2019 11:02   . amiodarone  200 mg Oral Daily  . apixaban  5 mg Oral BID  . carvedilol  12.5 mg Oral BID  . Chlorhexidine Gluconate Cloth  6 each Topical Q0600  . docusate sodium  200 mg Oral QHS  . ferrous sulfate  325 mg Oral Q breakfast  . levothyroxine  75 mcg Oral QAC breakfast  . sodium chloride flush  3 mL Intravenous Once    BMET    Component Value Date/Time   NA 138 02/02/2019 0548   NA 138 12/25/2018 1134   K 3.9 02/02/2019 0548   CL 97 (L) 02/02/2019 0548   CO2 30 02/02/2019 0548   GLUCOSE 102 (H) 02/02/2019 0548   BUN 75 (H) 02/02/2019 0548   BUN 99 (HH) 12/25/2018 1134   CREATININE 2.82 (H) 02/02/2019 0548   CALCIUM 8.4 (L) 02/02/2019 0548   GFRNONAA 15 (L) 02/02/2019 0548   GFRAA 18 (L) 02/02/2019 0548   CBC    Component Value Date/Time   WBC 7.0 02/01/2019 0505   RBC 3.09 (L) 02/01/2019 0505   HGB 10.4 (L) 02/01/2019 0505   HGB 9.9 (L) 12/25/2018 1134   HCT 32.9 (L) 02/01/2019 0505   HCT 29.6 (L) 12/25/2018 1134   PLT 193 02/01/2019 0505   PLT 151 12/25/2018 1134   MCV 106.5 (H) 02/01/2019 0505   MCV 103 (H) 12/25/2018 1134   MCH 33.7 02/01/2019 0505   MCHC 31.6 02/01/2019 0505   RDW 16.5 (H) 02/01/2019 0505   RDW 15.2 12/25/2018 1134   LYMPHSABS 0.4 (L) 01/31/2019 0441   LYMPHSABS 0.3 (L) 12/25/2018 1134   MONOABS 0.7 01/31/2019 0441   EOSABS 0.2 01/31/2019 0441   EOSABS 0.0 12/25/2018 1134   BASOSABS 0.0 01/31/2019 0441   BASOSABS 0.0 12/25/2018 1134     Assessment/Plan: 1. AKI/CKD stage 5 vs progressive CKD due to cardiorenal syndrome- She has now progressed to ESRD and is tolerating HD.  failed attempt to convert to biventricular pacer on 12/29/18 due to an occluded left subclavian vein).  tolerated first HD 02/01/19.  For second HD session tomorrow.  Awaiting outpatient HD facility and schedule.  2. Acute on chronic CHF- Heart failure team following.  On coreg. 1. UF with HD as  tolerated  2. Continue to follow strict I's/O's and daily weights.  3. Anemia of CKD stage 5- will check iron stores and follow. 4. A fib with AV nodal ablation- consult cardiology and likely EP.  She had failed attempt to convert to biventricular pacer on 12/29/18 due to an occluded left subclavian vein.   1. Will need functional AVF/AVG before attempting LV lead placement per Cards. 5. Hypothyroidism- cont with replacement therapy 6. OSA- on CPAP 7. PVC's- per Cardiology 8. Vascular access- s/p RIJ TDC placement today by IR.  Will order vein mapping and see if VVS can evaluate for AVF/AVG placement this hospitalization given her need for pacemaker replacement. 9. Disposition- pt is severely debilitated and requires max assistance to stand.  Not appropriate for discharge to home, however she does not want to go to rehab given past experiences.   Donetta Potts, MD [employer] [phone]

## 2019-02-02 NOTE — Evaluation (Signed)
Occupational Therapy Evaluation Patient Details Name: Gina [name] MRN: [id number] DOB: 04-Mar-1941 Today's Date: 02/02/2019    History of Present Illness Pt is a 78 y.o. female admitted 01/27/19 with worsening SOB; worked up for acute on chronic HF exacerbation. S/p RIJ TDC placement 11/30 and HD initiated. Per vascular, potential for sx 12/3 for permanent access. PMH includes HF, CKD V, afib s/p Saint Jude PPM, HTN, OSA on CPAP.   Clinical Impression   PTA patient independent with ADls, moblity and limited iADLs.  She was admitted for above and is limited by problem list below, including impaired balance, generalized weakness, decreased activity tolerance.  She requires min assist for UB ADLs, max assist for LB ADLs, and setup assist for grooming; declined transfer attempts today due to fatigue, but requires mod-max assist for bed mobility. She reports she assists her spouse (who has dementia) with IADLs, but he is able to provide some physical support at dc.  Based on performance today, patient will benefit from continued OT services while admitted and after dc at SNF level in order to optimize independence and safety with ADLs, mobility prior to dc home.  (Patient aware she cannot dc home at this level, but would prefer home vs SNF). Will follow acutely.    Follow Up Recommendations  SNF;Supervision/Assistance - 24 hour    Equipment Recommendations  3 in 1 bedside commode    Recommendations for Other Services       Precautions / Restrictions Precautions Precautions: Fall Restrictions Weight Bearing Restrictions: No      Mobility Bed Mobility Overal bed mobility: Needs Assistance Bed Mobility: Supine to Sit;Sit to Supine     Supine to sit: Mod assist;HOB elevated Sit to supine: Max assist   General bed mobility comments: cueing for technique, increased time; requires mod assist for trunk support and scooting to EOB, as well as intation of BLEs; returned to supine with support  of B LEs and trunk   Transfers Overall transfer level: Needs assistance Equipment used: Rolling walker (2 wheeled);1 person hand held assist Transfers: Sit to/from Stand Sit to Stand: Max assist         General transfer comment: declined     Balance Overall balance assessment: Needs assistance Sitting-balance support: Single extremity supported;Feet supported Sitting balance-Leahy Scale: Poor Sitting balance - Comments: relaint on at least 1 UE support seated EOB min guard to close supervision     Standing balance-Leahy Scale: Poor Standing balance comment: Reliant on UE support                           ADL either performed or assessed with clinical judgement   ADL Overall ADL's : Needs assistance/impaired     Grooming: Minimal assistance;Sitting   Upper Body Bathing: Minimal assistance;Sitting   Lower Body Bathing: Maximal assistance;Sitting/lateral leans   Upper Body Dressing : Minimal assistance;Sitting   Lower Body Dressing: Maximal assistance;Sitting/lateral leans Lower Body Dressing Details (indicate cue type and reason): decreased reach to B LEs, limited balance EOB without 1 UE support   Toilet Transfer Details (indicate cue type and reason): deferred, pt declined          Functional mobility during ADLs: Moderate assistance;Maximal assistance(limited to EOB) General ADL Comments: pt limited by weakness, impaired balance and decreased activity tolerance     Vision   Vision Assessment?: No apparent visual deficits     Perception     Praxis  Pertinent Vitals/Pain Pain Assessment: 0-10 Pain Score: 8  Faces Pain Scale: Hurts a little bit Pain Location: L knee  Pain Descriptors / Indicators: Guarding;Sore Pain Intervention(s): Monitored during session;Repositioned     Hand Dominance Right   Extremity/Trunk Assessment Upper Extremity Assessment Upper Extremity Assessment: Generalized weakness;LUE deficits/detail LUE Deficits /  Details: chronic L frozen shoulder, very limited FF    Lower Extremity Assessment Lower Extremity Assessment: Defer to PT evaluation   Cervical / Trunk Assessment Cervical / Trunk Assessment: Kyphotic   Communication Communication Communication: No difficulties   Cognition Arousal/Alertness: Awake/alert Behavior During Therapy: WFL for tasks assessed/performed Overall Cognitive Status: No family/caregiver present to determine baseline cognitive functioning Area of Impairment: Attention;Safety/judgement;Awareness;Problem solving;Following commands                   Current Attention Level: Selective   Following Commands: Follows one step commands consistently;Follows one step commands with increased time Safety/Judgement: Decreased awareness of deficits Awareness: Emergent Problem Solving: Requires verbal cues General Comments: pt with improving insight to deficits, agreeable that she cannot go home like this   General Comments  pt on RA SpO2 90%    Exercises     Shoulder Instructions      Home Living Family/patient expects to be discharged to:: Private residence Living Arrangements: Spouse/significant other Available Help at Discharge: Family;Available 24 hours/day Type of Home: House Home Access: Stairs to enter [employer] of Steps: 1   Home Layout: One level     Bathroom Shower/Tub: Occupational psychologist: Handicapped height     Home Equipment: Environmental consultant - 4 wheels;Bedside commode;Cane - single point;Shower seat;Grab bars - toilet;Wheelchair - manual   Additional Comments: Lives with husband who has dementia, reports he moves well, she assists with his IADLs (i.e. medication management)      Prior Functioning/Environment Level of Independence: Independent with assistive device(s)        Comments: Mod indep with rollator for household and community ambulation. Spouse assists with household tasks; pt manages medications/IADLs for  spouse        OT Problem List: Decreased strength;Decreased activity tolerance;Impaired balance (sitting and/or standing);Decreased cognition;Decreased safety awareness;Decreased knowledge of use of DME or AE;Decreased knowledge of precautions;Impaired UE functional use;Pain      OT Treatment/Interventions: Self-care/ADL training;DME and/or AE instruction;Therapeutic activities;Balance training;Patient/family education;Therapeutic exercise;Cognitive remediation/compensation    OT Goals(Current goals can be found in the care plan section) Acute Rehab OT Goals Patient Stated Goal: Hopeful for return home instead of needing SNF OT Goal Formulation: With patient Time For Goal Achievement: 02/16/19 Potential to Achieve Goals: Good  OT Frequency: Min 2X/week   Barriers to D/C:            Co-evaluation              AM-PAC OT "6 Clicks" Daily Activity     Outcome Measure Help from another person eating meals?: A Little Help from another person taking care of personal grooming?: A Little Help from another person toileting, which includes using toliet, bedpan, or urinal?: Total Help from another person bathing (including washing, rinsing, drying)?: A Lot Help from another person to put on and taking off regular upper body clothing?: A Little Help from another person to put on and taking off regular lower body clothing?: A Lot 6 Click Score: 14   End of Session Nurse Communication: Mobility status  Activity Tolerance: Patient tolerated treatment well Patient left: in bed;with call bell/phone within reach;with bed alarm set  OT Visit Diagnosis: Other abnormalities of gait and mobility (R26.89);Pain;Muscle weakness (generalized) (M62.81) Pain - Right/Left: Left Pain - part of body: Knee                Time: 1546-1610 OT Time Calculation (min): 24 min Charges:  OT General Charges $OT Visit: 1 Visit OT Evaluation $OT Eval Moderate Complexity: 1 Mod OT Treatments $Self  Care/Home Management : 8-22 mins  Delight Stare, OT Acute Rehabilitation Services Pager [phone] Office [phone]   Delight Stare 02/02/2019, 5:23 PM

## 2019-02-02 NOTE — Evaluation (Signed)
Physical Therapy Evaluation Patient Details Name: Gina [name] MRN: [id number] DOB: 02/23/1941 Today's Date: 02/02/2019   History of Present Illness  Pt is a 78 y.o. female admitted 01/27/19 with worsening SOB; worked up for acute on chronic HF exacerbation. S/p RIJ TDC placement 11/30 and HD initiated. Per vascular, potential for sx 12/3 for permanent access. PMH includes HF, CKD V, afib s/p Saint Jude PPM, HTN, OSA on CPAP.    Clinical Impression  Pt presents with an overall decrease in functional mobility secondary to above. PTA, pt reports mod indep with rollator, lives with husband who has dementia. Today, pt requiring mod-maxA to stand and take steps with RW, limited by generalized weakness, decreased activity tolerance and fearful of falling; pt with poor insight into current physical condition. SpO2 95-96% on RA. Pt would benefit from continued acute PT services to maximize functional mobility and independence prior to d/c with SNF-level therapies. Pt unsure about SNF due to previous bad experience at one last year.    Follow Up Recommendations SNF;Supervision for mobility/OOB    Equipment Recommendations  (TBD)    Recommendations for Other Services       Precautions / Restrictions Precautions Precautions: Fall Restrictions Weight Bearing Restrictions: No      Mobility  Bed Mobility Overal bed mobility: Needs Assistance Bed Mobility: Supine to Sit;Sit to Supine     Supine to sit: Mod assist;HOB elevated Sit to supine: Mod assist   General bed mobility comments: Significant increased time and effort initiating movement towards EOB even though pt stating, "I know I need to start by walking over with my legs..." modA to assist hips and trunk elevation; modA for BLE management return to supine  Transfers Overall transfer level: Needs assistance Equipment used: Rolling walker (2 wheeled);1 person hand held assist Transfers: Sit to/from Stand Sit to Stand: Max assist          General transfer comment: Increased time and effort preparing to stand, pt cued for hand placement, use of momentum and counting to "3" but once getting to "3" pt stops moving; able to perform 2x standing with RUE support and maxA to assist trunk elevation  Ambulation/Gait Ambulation/Gait assistance: Min assist Gait Distance (Feet): 2 Feet Assistive device: Rolling walker (2 wheeled) Gait Pattern/deviations: Step-to pattern;Leaning posteriorly;Trunk flexed Gait velocity: Decreased   General Gait Details: Marching in place then side steps towards [facility] with RW and minA, frequent cues to maintain standing as pt wanting to sit and rest; DOE 2/4, pt cued for pursed lip breathing and standing rest break; SpO2 95% on RA  Stairs            Wheelchair Mobility    Modified Rankin (Stroke Patients Only)       Balance Overall balance assessment: Needs assistance   Sitting balance-Leahy Scale: Fair       Standing balance-Leahy Scale: Poor Standing balance comment: Reliant on UE support                             Pertinent Vitals/Pain Pain Assessment: Faces Faces Pain Scale: Hurts a little bit Pain Location: L knee, L shoulder, back Pain Descriptors / Indicators: Guarding;Sore Pain Intervention(s): Monitored during session    Home Living Family/patient expects to be discharged to:: Private residence Living Arrangements: Spouse/significant other Available Help at Discharge: Family;Available 24 hours/day Type of Home: House Home Access: Stairs to enter   [employer] of Steps: 1 Home Layout: One  level Home Equipment: Walker - 4 wheels;Bedside commode;Grab bars - tub/shower;Cane - single point Additional Comments: Lives with husband who has dementia, reports he moves well, she assists with his IADLs (i.e. medication management)    Prior Function Level of Independence: Independent with assistive device(s)         Comments: Mod indep with  rollator for household and community ambulation. Spouse assists with household tasks; pt manages medications/IADLs for spouse     Hand Dominance        Extremity/Trunk Assessment   Upper Extremity Assessment Upper Extremity Assessment: Generalized weakness(reports chronic L shoulder pain)    Lower Extremity Assessment Lower Extremity Assessment: Generalized weakness(reports chronic L knee pain)    Cervical / Trunk Assessment Cervical / Trunk Assessment: Kyphotic  Communication   Communication: No difficulties  Cognition Arousal/Alertness: Awake/alert Behavior During Therapy: WFL for tasks assessed/performed Overall Cognitive Status: No family/caregiver present to determine baseline cognitive functioning Area of Impairment: Attention;Safety/judgement;Awareness;Problem solving;Following commands                   Current Attention Level: Selective   Following Commands: Follows one step commands consistently Safety/Judgement: Decreased awareness of deficits Awareness: Emergent Problem Solving: Requires verbal cues General Comments: Poor insight into current condition and importance of mobility; fearful of falling and seems internally distracted by this, frequent cues and encouragement for task      General Comments General comments (skin integrity, edema, etc.): Pt received on 3L O2 [place] with SpO2 100%. Maintaining 95-96% on RA with mobility throughout session, therefore left on RA (baseline only wears O2 at night with CPAP)    Exercises     Assessment/Plan    PT Assessment Patient needs continued PT services  PT Problem List Decreased strength;Decreased activity tolerance;Decreased balance;Decreased mobility;Cardiopulmonary status limiting activity       PT Treatment Interventions DME instruction;Gait training;Stair training;Functional mobility training;Therapeutic activities;Therapeutic exercise;Balance training;Patient/family education    PT Goals (Current  goals can be found in the Care Plan section)  Acute Rehab PT Goals Patient Stated Goal: Hopeful for return home instead of needing SNF PT Goal Formulation: With patient Time For Goal Achievement: 02/16/19 Potential to Achieve Goals: Fair    Frequency Min 3X/week   Barriers to discharge        Co-evaluation               AM-PAC PT "6 Clicks" Mobility  Outcome Measure Help needed turning from your back to your side while in a flat bed without using bedrails?: A Lot Help needed moving from lying on your back to sitting on the side of a flat bed without using bedrails?: A Lot Help needed moving to and from a bed to a chair (including a wheelchair)?: A Lot Help needed standing up from a chair using your arms (e.g., wheelchair or bedside chair)?: A Lot Help needed to walk in hospital room?: A Lot Help needed climbing 3-5 steps with a railing? : A Lot 6 Click Score: 12    End of Session Equipment Utilized During Treatment: Gait belt Activity Tolerance: Patient tolerated treatment well Patient left: in bed;with call bell/phone within reach;with bed alarm set Nurse Communication: Mobility status PT Visit Diagnosis: Other abnormalities of gait and mobility (R26.89);Muscle weakness (generalized) (M62.81)    Time: 1330-1403 PT Time Calculation (min) (ACUTE ONLY): 33 min   Charges:   PT Evaluation $PT Eval Moderate Complexity: 1 Mod PT Treatments $Therapeutic Activity: 8-22 mins   Mabeline Caras, PT, DPT Acute Rehabilitation  Services  Pager [phone] Office [phone]  Derry Lory 02/02/2019, 2:25 PM

## 2019-02-03 DIAGNOSIS — N186 End stage renal disease: Secondary | ICD-10-CM

## 2019-02-03 LAB — RENAL FUNCTION PANEL
Albumin: 2.7 g/dL — ABNORMAL LOW (ref 3.5–5.0)
Albumin: 3 g/dL — ABNORMAL LOW (ref 3.5–5.0)
Anion gap: 15 (ref 5–15)
Anion gap: 12 (ref 5–15)
BUN: 84 mg/dL — ABNORMAL HIGH (ref 8–23)
BUN: 42 mg/dL — ABNORMAL HIGH (ref 8–23)
CO2: 27 mmol/L (ref 22–32)
CO2: 29 mmol/L (ref 22–32)
Calcium: 8.4 mg/dL — ABNORMAL LOW (ref 8.9–10.3)
Calcium: 8.2 mg/dL — ABNORMAL LOW (ref 8.9–10.3)
Chloride: 95 mmol/L — ABNORMAL LOW (ref 98–111)
Chloride: 99 mmol/L (ref 98–111)
Creatinine, Ser: 3.04 mg/dL — ABNORMAL HIGH (ref 0.44–1.00)
Creatinine, Ser: 1.93 mg/dL — ABNORMAL HIGH (ref 0.44–1.00)
GFR calc Af Amer: 16 mL/min — ABNORMAL LOW (ref >60)
GFR calc Af Amer: 28 mL/min — ABNORMAL LOW (ref >60)
GFR calc non Af Amer: 14 mL/min — ABNORMAL LOW (ref >60)
GFR calc non Af Amer: 24 mL/min — ABNORMAL LOW (ref >60)
Glucose, Bld: 94 mg/dL (ref 70–99)
Glucose, Bld: 119 mg/dL — ABNORMAL HIGH (ref 70–99)
Phosphorus: 4.8 mg/dL — ABNORMAL HIGH (ref 2.5–4.6)
Phosphorus: 2.9 mg/dL (ref 2.5–4.6)
Potassium: 3.5 mmol/L (ref 3.5–5.1)
Potassium: 3.8 mmol/L (ref 3.5–5.1)
Sodium: 137 mmol/L (ref 135–145)
Sodium: 140 mmol/L (ref 135–145)

## 2019-02-03 LAB — CBC
HCT: 33.6 % — ABNORMAL LOW (ref 36.0–46.0)
HCT: 33.4 % — ABNORMAL LOW (ref 36.0–46.0)
Hemoglobin: 10.4 g/dL — ABNORMAL LOW (ref 12.0–15.0)
Hemoglobin: 10.4 g/dL — ABNORMAL LOW (ref 12.0–15.0)
MCH: 33.2 pg (ref 26.0–34.0)
MCH: 33.1 pg (ref 26.0–34.0)
MCHC: 31 g/dL (ref 30.0–36.0)
MCHC: 31.1 g/dL (ref 30.0–36.0)
MCV: 107.3 fL — ABNORMAL HIGH (ref 80.0–100.0)
MCV: 106.4 fL — ABNORMAL HIGH (ref 80.0–100.0)
Platelets: 187 10*3/uL (ref 150–400)
Platelets: 189 10*3/uL (ref 150–400)
RBC: 3.13 MIL/uL — ABNORMAL LOW (ref 3.87–5.11)
RBC: 3.14 MIL/uL — ABNORMAL LOW (ref 3.87–5.11)
RDW: 16.2 % — ABNORMAL HIGH (ref 11.5–15.5)
RDW: 15.9 % — ABNORMAL HIGH (ref 11.5–15.5)
WBC: 4.6 10*3/uL (ref 4.0–10.5)
WBC: 5.9 10*3/uL (ref 4.0–10.5)
nRBC: 0 % (ref 0.0–0.2)
nRBC: 0 % (ref 0.0–0.2)

## 2019-02-03 LAB — MAGNESIUM: Magnesium: 2.2 mg/dL (ref 1.7–2.4)

## 2019-02-03 LAB — APTT
aPTT: 57 seconds — ABNORMAL HIGH (ref 24–36)
aPTT: 73 seconds — ABNORMAL HIGH (ref 24–36)

## 2019-02-03 LAB — HEPARIN LEVEL (UNFRACTIONATED): Heparin Unfractionated: >2.2 IU/mL — ABNORMAL HIGH (ref 0.30–0.70)

## 2019-02-03 MED ORDER — VANCOMYCIN HCL IN DEXTROSE 1-5 GM/200ML-% IV SOLN: 1000.0000 mg | INTRAVENOUS | Status: AC

## 2019-02-03 MED ORDER — ACETAMINOPHEN 500 MG PO TABS
1000.0000 mg | ORAL_TABLET | Freq: Once | ORAL | Status: AC
  Administered 2019-02-04: 1000 mg via ORAL
  Filled 2019-02-03: qty 2

## 2019-02-03 MED ORDER — HEPARIN SODIUM (PORCINE) 1000 UNIT/ML DIALYSIS
20.0000 [IU]/kg | INTRAMUSCULAR | Status: DC | PRN
  Filled 2019-02-03: qty 2

## 2019-02-03 MED ORDER — HEPARIN SODIUM (PORCINE) 1000 UNIT/ML IJ SOLN
INTRAMUSCULAR | Status: AC
  Administered 2019-02-03: 3200 [IU]
  Filled 2019-02-03: qty 3

## 2019-02-03 MED ORDER — MUPIROCIN 2 % EX OINT
1.0000 "application " | TOPICAL_OINTMENT | Freq: Once | CUTANEOUS | Status: AC
  Administered 2019-02-04: 1 via TOPICAL
  Filled 2019-02-03: qty 22

## 2019-02-03 NOTE — Anesthesia Preprocedure Evaluation (Addendum)
Anesthesia Evaluation  Patient identified by MRN, date of birth, ID band Patient awake    Reviewed: Allergy & Precautions, NPO status , Patient's Chart, lab work & pertinent test results  History of Anesthesia Complications Negative for: history of anesthetic complications  Airway Mallampati: I  TM Distance: >3 FB Neck ROM: Full    Dental  (+) Edentulous Upper, Dental Advisory Given   Pulmonary sleep apnea and Oxygen sleep apnea , former smoker,    Pulmonary exam normal        Cardiovascular hypertension, Pt. on home beta blockers + CAD and +CHF  Normal cardiovascular exam+ dysrhythmias Atrial Fibrillation + pacemaker + Valvular Problems/Murmurs MR and AI   ECG: A-fib, LBBB, LAD  ECHO: LV EF: 20%   Neuro/Psych negative neurological ROS  negative psych ROS   GI/Hepatic negative GI ROS, Neg liver ROS,   Endo/Other  Morbid obesity  Renal/GU CRFRenal disease     Musculoskeletal negative musculoskeletal ROS (+)   Abdominal (+) - obese,   Peds  Hematology  (+) anemia ,   Anesthesia Other Findings   Reproductive/Obstetrics                            Anesthesia Physical  Anesthesia Plan  ASA: III  Anesthesia Plan: MAC   Post-op Pain Management:    Induction: Intravenous  PONV Risk Score and Plan: 3 and Treatment may vary due to age or medical condition, Ondansetron and Dexamethasone  Airway Management Planned: Natural Airway  Additional Equipment:   Intra-op Plan:   Post-operative Plan:   Informed Consent: I have reviewed the patients History and Physical, chart, labs and discussed the procedure including the risks, benefits and alternatives for the proposed anesthesia with the patient or authorized representative who has indicated his/her understanding and acceptance.     Dental advisory given  Plan Discussed with: CRNA and Anesthesiologist  Anesthesia Plan Comments:         Anesthesia Quick Evaluation

## 2019-02-03 NOTE — Progress Notes (Signed)
[place] for heparin Indication: atrial fibrillation  Allergies  Allergen Reactions  . Bactrim [Sulfamethoxazole-Trimethoprim] Other (See Comments)    Flu like symptoms  . Cephalexin Rash    REACTION: Rash to arms/legs    Patient Measurements: Height: 5\' 4"  (162.6 cm) Weight: 217 lb 9.5 oz (98.7 kg) IBW/kg (Calculated) : 54.7 Heparin Dosing Weight: 79kg  Vital Signs: Temp: 97.3 F (36.3 C) (12/02 0631) Temp Source: Oral (12/02 0631) BP: 100/66 (12/02 0631) Pulse Rate: 73 (12/02 0631)  Labs: Recent Labs    02/01/19 0505 02/02/19 0548 02/03/19 0419  HGB 10.4*  --  10.4*  HCT 32.9*  --  33.6*  PLT 193  --  187  APTT  --   --  57*  LABPROT 16.6*  --   --   INR 1.4*  --   --   CREATININE 3.48* 2.82* 3.04*    Estimated Creatinine Clearance: 17.4 mL/min (A) (by C-G formula based on SCr of 3.04 mg/dL (H)).  Assessment: 52 yoF admitted with progressive CHF and CKD. Pt is on Eliquis PTA for hx PAF which was resumed 11/30 after holding for temporary HD access. VVS now consulted for permanent access, pharmacy asked to start heparin bridge. Last dose of Eliquis was 12/1 ~0900.  PTT 57 sec (subtherapeutic). Heparin level expected to be high due to apixaban. No issues with line or bleeding reported per RN.  Goal of Therapy:  Heparin level 0.3-0.7 units/ml aPTT 66-102 seconds Monitor platelets by anticoagulation protocol: Yes   Plan:  Increase heparin to 1250 units/h  Will f/u 8hr PTT  Sherlon Handing, PharmD, BCPS Please see amion for complete clinical pharmacist phone list 02/03/2019 6:43 AM

## 2019-02-03 NOTE — Progress Notes (Signed)
Patient placed self on CPAP machine. RT assistance not needed at this time.

## 2019-02-03 NOTE — Procedures (Signed)
I was present at this dialysis session. I have reviewed the session itself and made appropriate changes.   Vital signs in last 24 hours:  Temp:  [97.3 F (36.3 C)-98.8 F (37.1 C)] 97.3 F (36.3 C) (12/02 0631) Pulse Rate:  [71-76] 73 (12/02 0631) Resp:  [18-20] 20 (12/02 0631) BP: (93-118)/(61-70) 100/66 (12/02 0631) SpO2:  [94 %-100 %] 98 % (12/02 0631) Weight:  [99.7 kg] 99.7 kg (12/02 0631) Weight change: 2.9 kg Filed Weights   02/01/19 1817 02/02/19 0441 02/03/19 0631  Weight: 96.1 kg 98.7 kg 99.7 kg    Recent Labs  Lab 02/03/19 0419  NA 137  K 3.5  CL 95*  CO2 27  GLUCOSE 94  BUN 84*  CREATININE 3.04*  CALCIUM 8.4*  PHOS 4.8*    Recent Labs  Lab 01/31/19 0441 02/01/19 0505 02/03/19 0419  WBC 5.8 7.0 4.6  NEUTROABS 4.5  --   --   HGB 10.2* 10.4* 10.4*  HCT 32.7* 32.9* 33.6*  MCV 108.3* 106.5* 107.3*  PLT 184 193 187    Scheduled Meds: . amiodarone  200 mg Oral Daily  . carvedilol  12.5 mg Oral BID  . Chlorhexidine Gluconate Cloth  6 each Topical Q0600  . docusate sodium  200 mg Oral QHS  . ferrous sulfate  325 mg Oral Q breakfast  . levothyroxine  75 mcg Oral QAC breakfast  . sodium chloride flush  3 mL Intravenous Once   Continuous Infusions: . heparin 1,250 Units/hr (02/03/19 0703)  . [START ON 02/04/2019] vancomycin     PRN Meds:.acetaminophen **OR** acetaminophen, Muscle Rub, ondansetron **OR** ondansetron (ZOFRAN) IV    Assessment/Plan: 1. AKI/CKD stage 5 vs progressive CKD due to cardiorenal syndrome- She hasnow progressed to ESRD and is tolerating HD.failed attempt to convert to biventricular pacer on 12/29/18 due to an occluded left subclavian vein).tolerated first HD 02/01/19.  Awaiting outpatient HD facility and schedule.HD today and again on Friday.   2. Acute on chronic CHF-Heart failure team following. On coreg. 1. UF with HD as tolerated 2. Continue to follow strict I's/O's and daily weights.  3. Anemia of CKD stage 5- will  check iron stores and follow. 4. A fib with AV nodal ablation- consult cardiology and likely EP. She had failed attempt to convert to biventricular pacer on 12/29/18 due to an occluded left subclavian vein.  1. Will need functional AVF/AVG before attempting LV lead placement per Cards. 5. Hypothyroidism- cont with replacement therapy 6. OSA- on CPAP 7. PVC's- per Cardiology 8. Vascular access- s/p RIJ TDC placement 02/02/19 by IR. For AVF creation 02/04/19 by Dr. Scot Dock.   9. Disposition- pt is severely debilitated and requires max assistance to stand.  Not appropriate for discharge to home, however she does not want to go to rehab given past experiences and issues with her husband who has dementia.  Consider CIR for 1-2 weeks until she is strong enough to move on her own.  Donetta Potts,  MD 02/03/2019, 8:25 AM

## 2019-02-03 NOTE — Progress Notes (Signed)
   VASCULAR SURGERY ASSESSMENT & PLAN:   END-STAGE RENAL DISEASE: She is scheduled for placement of new access in the right arm tomorrow.  Based on her vein map she may be a candidate for a right brachiocephalic fistula or a basilic vein transposition.  Her Eliquis was held yesterday.  She is on IV heparin which I will stop prior to surgery.  I have previously discussed the indications for the procedure and the potential complications and she is agreeable to proceed.  She is scheduled for dialysis today.  Please do not do dialysis on Thursday as she is scheduled for surgery that day.   I have written preop orders.   SUBJECTIVE:   Resting comfortably so I did not wake her this morning.  PHYSICAL EXAM:   Vitals:   02/02/19 1618 02/02/19 2016 02/03/19 0125 02/03/19 0631  BP: 114/67 108/68 93/61 100/66  Pulse: 72 71 76 73  Resp: 19 20 20 20   Temp: 97.7 F (36.5 C) 98.8 F (37.1 C)  (!) 97.3 F (36.3 C)  TempSrc: Oral Oral  Oral  SpO2: 96% 94% 100% 98%  Weight:      Height:        LABS:   Lab Results  Component Value Date   WBC 4.6 02/03/2019   HGB 10.4 (L) 02/03/2019   HCT 33.6 (L) 02/03/2019   MCV 107.3 (H) 02/03/2019   PLT 187 02/03/2019   Lab Results  Component Value Date   CREATININE 3.04 (H) 02/03/2019   Lab Results  Component Value Date   INR 1.4 (H) 02/01/2019    PROBLEM LIST:    Principal Problem:   Acute on chronic congestive heart failure (HCC) Active Problems:   Hypertension   Mitral regurgitation   Anemia of chronic disease   OSA (obstructive sleep apnea)   Atrial fibrillation with RVR (HCC)   Pacemaker   Hypothyroidism   CKD (chronic kidney disease) stage 5, GFR less than 15 ml/min (HCC)   PVC (premature ventricular contraction)   Pressure injury of skin   CURRENT MEDS:   . amiodarone  200 mg Oral Daily  . carvedilol  12.5 mg Oral BID  . Chlorhexidine Gluconate Cloth  6 each Topical Q0600  . docusate sodium  200 mg Oral QHS  . ferrous  sulfate  325 mg Oral Q breakfast  . levothyroxine  75 mcg Oral QAC breakfast  . sodium chloride flush  3 mL Intravenous Once    Deitra Mayo Office: [phone] 02/03/2019

## 2019-02-03 NOTE — Progress Notes (Signed)
ANTICOAGULATION CONSULT NOTE - Follow Up Consult  Pharmacy Consult for Heparin (Eliquis on hold) Indication: atrial fibrillation  Allergies  Allergen Reactions  . Bactrim [Sulfamethoxazole-Trimethoprim] Other (See Comments)    Flu like symptoms  . Cephalexin Rash    REACTION: Rash to arms/legs    Patient Measurements: Height: 5\' 4"  (162.6 cm) Weight: 211 lb 3.2 oz (95.8 kg) IBW/kg (Calculated) : 54.7 Heparin Dosing Weight: 79 kg  Vital Signs: Temp: 98.3 F (36.8 C) (12/02 1645) Temp Source: Oral (12/02 1645) BP: 100/66 (12/02 1645) Pulse Rate: 71 (12/02 1645)  Labs: Recent Labs    02/01/19 0505 02/02/19 0548 02/03/19 0419 02/03/19 1120 02/03/19 1610  HGB 10.4*  --  10.4*  --  10.4*  HCT 32.9*  --  33.6*  --  33.4*  PLT 193  --  187  --  189  APTT  --   --  57*  --  73*  LABPROT 16.6*  --   --   --   --   INR 1.4*  --   --   --   --   HEPARINUNFRC  --   --  >2.20*  --   --   CREATININE 3.48* 2.82* 3.04* 1.93*  --    Assessment:  16 yoF admitted with progressive CHF and CKD. Pt is on Eliquis PTA for hx PAF which was resumed 11/30 after holding for temporary HD access. VVS consulted for permanent access, planned for 12/3 am. Pharmacy asked to start heparin bridge on 12/1. Last dose of Eliquis was 12/1 ~0900.    Initial aPTT was subtherapeutic on Heparin at 1100 units/hr > increased to 1250 units/hr early this am.  APTT is now therapeutic (73 seconds). Heparin level falsely elevated this am due to recent Eliquis doses.  Goal of Therapy:  Heparin level 0.3-0.7 units/ml aPTT 66-102 seconds Monitor platelets by anticoagulation protocol: Yes   Plan:   Continue heparin drip at 1250 units/hr  Daily aPTT, heparin level, and CBC.  Expect heparin to stop on call to OR in am.  Will follow up post-op for anticoagulation plan.  Eliquis on hold.  Arty Baumgartner, [place] Pager: [phone] or phone: [phone] 02/03/2019,5:19 PM

## 2019-02-03 NOTE — Progress Notes (Signed)
PROGRESS NOTE    Gina [name]  [id number] DOB: [birth year] DOA: 01/27/2019 PCP: Glenda Chroman, MD   Brief Narrative: 78 year old with past medical history significant for chronic systolic heart failure due to nonischemic cardiomyopathy with ejection fraction of 20%, left bundle branch block, a stage V chronic kidney disease, A. fib status post AV nodal ablation s/p  Saint Jude PPM in 2019 on Eliquis, anemia of chronic disease, hypertension, renal artery stenosis, obstructive sleep apnea on CPAP who presents to the emergency department complaining of worsening shortness of breath for 3 weeks prior to admission.  Patient was evaluated at the heart failure clinic the morning of admission and was referred to the ED for admission.  Patient reported weight gain, shortness of breath on exertion.  She has been compliant with her diuretics.   Patient was admitted for acute on chronic systolic heart failure exacerbation.  She was a started on IV Lexis.  Nephrology was consulted as the patient is nearing initiation of dialysis.  Patient underwent tunnel catheter placement on 02/01/2019, with the plan of initiation hemodialysis.  Plan is for removal by cardiology of atrial -lead for PPM with placement of LV lead after fistula is mature and in a stable use. Patient has not been out of bed entire hospitalization so will need PT eval.   Assessment & Plan:   Principal Problem:   Acute on chronic congestive heart failure (HCC) Active Problems:   Hypertension   Mitral regurgitation   Anemia of chronic disease   OSA (obstructive sleep apnea)   Atrial fibrillation with RVR (HCC)   Pacemaker   Hypothyroidism   CKD (chronic kidney disease) stage 5, GFR less than 15 ml/min (HCC)   PVC (premature ventricular contraction)   Pressure injury of skin   Acute on chronic systolic congestive heart failure exacerbation: -Cardiology and nephrology has been helping with patient care during this  admission. -Patient was treated with IV lasix 100 mg IV BID. Lasix has been discontinue, now that patient will start HD -An attempt was made by cardiology on 12/2018 to place an LV lead in the patient with removal of lateral leads to improve her output.  This was unsuccessful due to upper extremity thrombosis.  The plan now is for the patient to return for this procedure after her AV fistula is mature and  stable -overall negative  -started on HD 11/30  Stage V chronic kidney disease, progression of end-stage renal disease: -Nephrology was consulted -Underwent tunnel catheter placement on 11/30. -HD started 11/30 -for AV fistula in AM  Hyperkalemia: Resolved  left knee pain: Knee erythematosus swollen.  Patient was evaluated by orthopedic who is recommending local Aspercreme.  Weightbearing as tolerated.  Follow-up with Dr. Benjaman Kindler in 2 to 4 weeks.  A. fib with RVR: Eliquis was held for tunneled catheter placement.    Continue with carvedilol. -amiodarone -heparin-- resume eliquis when ok with vascular  Anemia of chronic disease: With mild iron deficiency anemia: Monitor hemoglobin.  Hypothyroidism: Continue with levothyroxine  Obstructive  sleep apnea:  -Continue with CPAP   Pressure Injury 01/27/19 Buttocks Right;Left Stage I -  Intact skin with non-blanchable redness of a localized area usually over a bony prominence. (Active)  01/27/19 1700  Location: Buttocks  Location Orientation: Right;Left  Staging: Stage I -  Intact skin with non-blanchable redness of a localized area usually over a bony prominence.  Wound Description (Comments):   Present on Admission: Yes   Deconditioning -PT eval- ? Need for SNF  obesity Estimated body mass index is 36.25 kg/m as calculated from the following:   Height as of this encounter: 5\' 4"  (1.626 m).   Weight as of this encounter: 95.8 kg.   DVT prophylaxis: heparin Code Status: Full code Family Communication: patient Disposition  Plan: OOB, needs vascular access- planned for Thursday  Consultants:   Cardiology  Nephrology  Procedures:   Hemodialysis tunnel catheter placement    Subjective: No issues with HD Feels weak overall  Objective: Vitals:   02/03/19 0915 02/03/19 0945 02/03/19 0948 02/03/19 1037  BP: 112/67 (!) 110/58 116/68 115/75  Pulse: 75 67 71 70  Resp:   20   Temp:   98.1 F (36.7 C) 97.6 F (36.4 C)  TempSrc:   Oral Oral  SpO2:   100% 100%  Weight:   95.8 kg   Height:        Intake/Output Summary (Last 24 hours) at 02/03/2019 1150 Last data filed at 02/03/2019 0948 Gross per 24 hour  Intake 500 ml  Output 1801 ml  Net -1301 ml   Filed Weights   02/03/19 0631 02/03/19 0700 02/03/19 0948  Weight: 99.7 kg 97.4 kg 95.8 kg    Examination:  General exam: in bed, just back from HD Respiratory system: no increased work of breathing Cardiovascular system: rrr Gastrointestinal system: +BS, soft, NT Central nervous system: A+Ox3   Data Reviewed: I have personally reviewed following labs and imaging studies  CBC: Recent Labs  Lab 01/27/19 1652 01/28/19 0237 01/31/19 0441 02/01/19 0505 02/03/19 0419  WBC 5.5 5.2 5.8 7.0 4.6  NEUTROABS  --   --  4.5  --   --   HGB 10.1* 9.5* 10.2* 10.4* 10.4*  HCT 32.5* 30.4* 32.7* 32.9* 33.6*  MCV 107.3* 108.2* 108.3* 106.5* 107.3*  PLT 234 221 184 193 [id number]   Basic Metabolic Panel: Recent Labs  Lab 01/30/19 0448 01/30/19 1828 01/31/19 0441 01/31/19 1340 02/01/19 0505 02/02/19 0548 02/03/19 0419  NA 142 139 141  --  138 138 137  K 3.9 3.5 3.2* 4.2 4.0 3.9 3.5  CL 97* 95* 95*  --  96* 97* 95*  CO2 29 30 32  --  27 30 27   GLUCOSE 102* 146* 91  --  95 102* 94  BUN 120* 115* 113*  --  112* 75* 84*  CREATININE 4.08* 3.74* 3.56*  --  3.48* 2.82* 3.04*  CALCIUM 8.7* 8.3* 8.4*  --  8.1* 8.4* 8.4*  MG 2.3  --  2.2  --  2.2 2.2 2.2  PHOS 4.1  --  4.2  --  4.5 4.3 4.8*   GFR: Estimated Creatinine Clearance: 17.1 mL/min (A) (by C-G  formula based on SCr of 3.04 mg/dL (H)). Liver Function Tests: Recent Labs  Lab 01/27/19 1652 01/28/19 0237  01/30/19 0448 01/31/19 0441 02/01/19 0505 02/02/19 0548 02/03/19 0419  AST 47* 41  --   --   --   --   --   --   ALT 103* 99*  --   --   --   --   --   --   ALKPHOS 69 66  --   --   --   --   --   --   BILITOT 1.7* 1.6*  --   --   --   --   --   --   PROT 6.4* 5.7*  --   --   --   --   --   --  ALBUMIN 3.5 3.3*   < > 3.1* 3.0* 2.9* 2.9* 2.7*   < > = values in this interval not displayed.   No results for input(s): LIPASE, AMYLASE in the last 168 hours. No results for input(s): AMMONIA in the last 168 hours. Coagulation Profile: Recent Labs  Lab 01/27/19 1233 02/01/19 0505  INR 2.5* 1.4*   Cardiac Enzymes: No results for input(s): CKTOTAL, CKMB, CKMBINDEX, TROPONINI in the last 168 hours. BNP (last 3 results) No results for input(s): PROBNP in the last 8760 hours. HbA1C: No results for input(s): HGBA1C in the last 72 hours. CBG: No results for input(s): GLUCAP in the last 168 hours. Lipid Profile: No results for input(s): CHOL, HDL, LDLCALC, TRIG, CHOLHDL, LDLDIRECT in the last 72 hours. Thyroid Function Tests: No results for input(s): TSH, T4TOTAL, FREET4, T3FREE, THYROIDAB in the last 72 hours. Anemia Panel: No results for input(s): VITAMINB12, FOLATE, FERRITIN, TIBC, IRON, RETICCTPCT in the last 72 hours. Sepsis Labs: No results for input(s): PROCALCITON, LATICACIDVEN in the last 168 hours.  Recent Results (from the past 240 hour(s))  SARS CORONAVIRUS 2 (TAT 6-24 HRS) Nasopharyngeal Nasopharyngeal Swab     Status: None   Collection Time: 01/27/19  2:53 PM   Specimen: Nasopharyngeal Swab  Result Value Ref Range Status   SARS Coronavirus 2 NEGATIVE NEGATIVE Final    Comment: (NOTE) SARS-CoV-2 target nucleic acids are NOT DETECTED. The SARS-CoV-2 RNA is generally detectable in upper and lower respiratory specimens during the acute phase of infection.  Negative results do not preclude SARS-CoV-2 infection, do not rule out co-infections with other pathogens, and should not be used as the sole basis for treatment or other patient management decisions. Negative results must be combined with clinical observations, patient history, and epidemiological information. The expected result is Negative. Fact Sheet for Patients: SugarRoll.be Fact Sheet for Healthcare Providers: https://www.woods-mathews.com/ This test is not yet approved or cleared by the [place] FDA and  has been authorized for detection and/or diagnosis of SARS-CoV-2 by FDA under an Emergency Use Authorization (EUA). This EUA will remain  in effect (meaning this test can be used) for the duration of the COVID-19 declaration under Section 56 4(b)(1) of the Act, 21 U.S.C. section 360bbb-3(b)(1), unless the authorization is terminated or revoked sooner. Performed at [facility], [place] [address] [place], [place] [address]          Radiology Studies: Vas [place] Upper Ext Vein Mapping (pre-op Avf)  Result Date: 02/02/2019 UPPER EXTREMITY VEIN MAPPING Performing Technologist: Antonieta Pert RDMS, RVT  Examination Guidelines: A complete evaluation includes B-mode imaging, spectral Doppler, color Doppler, and power Doppler as needed of all accessible portions of each vessel. Bilateral testing is considered an integral part of a complete examination. Limited examinations for reoccurring indications may be performed as noted. +-----------------+-------------+----------+--------+  Right Cephalic    Diameter (cm) Depth (cm) Findings  +-----------------+-------------+----------+--------+  Shoulder              0.24         1.25              +-----------------+-------------+----------+--------+  Prox upper arm        0.27         0.71              +-----------------+-------------+----------+--------+  Mid upper arm         0.30         0.70               +-----------------+-------------+----------+--------+  Dist upper arm        0.35         0.36              +-----------------+-------------+----------+--------+  Antecubital fossa     0.65         0.30              +-----------------+-------------+----------+--------+  Prox forearm          0.30         0.34              +-----------------+-------------+----------+--------+  Mid forearm           0.27         0.52              +-----------------+-------------+----------+--------+  Dist forearm          0.16         0.40              +-----------------+-------------+----------+--------+  Wrist                 0.13         0.53              +-----------------+-------------+----------+--------+ +-----------------+-------------+----------+--------------+  Right Basilic     Diameter (cm) Depth (cm)    Findings     +-----------------+-------------+----------+--------------+  Prox upper arm        0.43         1.06                    +-----------------+-------------+----------+--------------+  Mid upper arm         0.42         1.20                    +-----------------+-------------+----------+--------------+  Dist upper arm        0.46         0.85                    +-----------------+-------------+----------+--------------+  Antecubital fossa     0.44         0.60                    +-----------------+-------------+----------+--------------+  Prox forearm          0.21         0.43                    +-----------------+-------------+----------+--------------+  Mid forearm                                not visualized  +-----------------+-------------+----------+--------------+  Distal forearm                             not visualized  +-----------------+-------------+----------+--------------+  Elbow                                      not visualized  +-----------------+-------------+----------+--------------+  Wrist                                      not visualized   +-----------------+-------------+----------+--------------+ +-----------------+-------------+----------+-----------------------------+  Left Cephalic     Diameter (cm) Depth (cm)           Findings             +-----------------+-------------+----------+-----------------------------+  Shoulder              0.10         0.42                                   +-----------------+-------------+----------+-----------------------------+  Prox upper arm        0.13         0.89                                   +-----------------+-------------+----------+-----------------------------+  Mid upper arm         0.12         0.51                                   +-----------------+-------------+----------+-----------------------------+  Dist upper arm        0.14         0.36                                   +-----------------+-------------+----------+-----------------------------+  Antecubital fossa                          not visualized and IV bandage  +-----------------+-------------+----------+-----------------------------+  Prox forearm                                      not visualized          +-----------------+-------------+----------+-----------------------------+  Mid forearm                                       not visualized          +-----------------+-------------+----------+-----------------------------+  Dist forearm                                      not visualized          +-----------------+-------------+----------+-----------------------------+  Wrist                                             not visualized          +-----------------+-------------+----------+-----------------------------+ +-----------------+-------------+----------+--------------+  Left Basilic      Diameter (cm) Depth (cm)    Findings     +-----------------+-------------+----------+--------------+  Shoulder              0.66         1.17                    +-----------------+-------------+----------+--------------+  Prox upper  arm        0.51  1.27                    +-----------------+-------------+----------+--------------+  Mid upper arm         0.42         1.10                    +-----------------+-------------+----------+--------------+  Dist upper arm        0.37         1.02      branching     +-----------------+-------------+----------+--------------+  Antecubital fossa     0.11         0.36                    +-----------------+-------------+----------+--------------+  Prox forearm                               not visualized  +-----------------+-------------+----------+--------------+  Mid forearm                                not visualized  +-----------------+-------------+----------+--------------+  Distal forearm                             not visualized  +-----------------+-------------+----------+--------------+  Elbow                                      not visualized  +-----------------+-------------+----------+--------------+  Wrist                                      not visualized  +-----------------+-------------+----------+--------------+ *See table(s) above for measurements and observations.  Diagnosing physician: Deitra Mayo MD Electronically signed by Deitra Mayo MD on 02/02/2019 at 5:39:49 PM.    Final         Scheduled Meds:  amiodarone  200 mg Oral Daily   carvedilol  12.5 mg Oral BID   Chlorhexidine Gluconate Cloth  6 each Topical Q0600   docusate sodium  200 mg Oral QHS   ferrous sulfate  325 mg Oral Q breakfast   levothyroxine  75 mcg Oral QAC breakfast   [START ON 02/04/2019] mupirocin ointment  1 application Topical Once   Continuous Infusions:  heparin 1,250 Units/hr (02/03/19 0703)   [START ON 02/04/2019] vancomycin       LOS: 7 days    Time spent: 35 minutes.     Geradine Girt, DO [facility]   If 7PM-7AM, please contact night-coverage [email or web address] Password [email or web address] 02/03/2019, 11:50 AM

## 2019-02-03 NOTE — Progress Notes (Signed)
Physical Therapy Treatment Patient Details Name: Gina [name] MRN: [id number] DOB: 1940/10/26 Today's Date: 02/03/2019    History of Present Illness Pt is a 78 y.o. female admitted 01/27/19 with worsening SOB; worked up for acute on chronic HF exacerbation. S/p RIJ TDC placement 11/30 and HD initiated. Per vascular, potential for sx 12/3 for permanent access. PMH includes HF, CKD V, afib s/p Saint Jude PPM, HTN, OSA on CPAP.    PT Comments    Patient seen for mobility progression. Pt in recliner upon arrival and agreeable to participate in therapy. Attempted to stand X 4 trials but unable despite max A. Stedy standing frame and +2 assist to power up into standing from recliner and min A required to stand from standing frame seat. Continue to progress as tolerated with anticipated d/c to SNF for further skilled PT services.     Follow Up Recommendations  SNF;Supervision for mobility/OOB     Equipment Recommendations  Other (comment)(TBD next venue)    Recommendations for Other Services       Precautions / Restrictions Precautions Precautions: Fall Restrictions Weight Bearing Restrictions: No    Mobility  Bed Mobility Overal bed mobility: Needs Assistance Bed Mobility: Sit to Sidelying;Rolling Rolling: Min guard       Sit to sidelying: Mod assist General bed mobility comments: cues for sequencing; assist to bring bilat LE into bed and then cues for positioning once in bed  Transfers Overall transfer level: Needs assistance   Transfers: Sit to/from Stand Sit to Stand: Mod assist;+2 physical assistance;Min assist;+2 safety/equipment         General transfer comment: attempted X 4 trials to stand from recliner but unable despite max A and cues for sequencing/hand placement; when attempting to use momentm to power up pt counts to 3 and then does not attempt to stand/just stops; Stedy standing frame utilized for transfers and mod A +2 required to power up from recliner and  then min A from standing frame seat (elevated surface)  Ambulation/Gait                 Stairs             Wheelchair Mobility    Modified Rankin (Stroke Patients Only)       Balance Overall balance assessment: Needs assistance Sitting-balance support: Single extremity supported;Feet supported Sitting balance-Leahy Scale: Poor     Standing balance support: Bilateral upper extremity supported;During functional activity Standing balance-Leahy Scale: Poor Standing balance comment: worked on standing with use of standing frame; pt requesting to sit down throughout and with DOE; SpO2 97% or > on RA                            Cognition Arousal/Alertness: Awake/alert Behavior During Therapy: WFL for tasks assessed/performed Overall Cognitive Status: No family/caregiver present to determine baseline cognitive functioning Area of Impairment: Attention;Safety/judgement;Awareness;Problem solving;Following commands                   Current Attention Level: Selective   Following Commands: Follows one step commands consistently;Follows one step commands with increased time Safety/Judgement: Decreased awareness of deficits Awareness: Emergent Problem Solving: Requires verbal cues;Decreased initiation        Exercises      General Comments        Pertinent Vitals/Pain Pain Assessment: Faces Faces Pain Scale: Hurts little more Pain Location: neck Pain Descriptors / Indicators: Sore;Guarding Pain Intervention(s): Limited activity within patient's  tolerance;Monitored during session;Repositioned;Patient requesting pain meds-RN notified;Heat applied    Home Living                      Prior Function            PT Goals (current goals can now be found in the care plan section) Acute Rehab PT Goals Patient Stated Goal: Hopeful for return home instead of needing SNF Progress towards PT goals: Progressing toward goals     Frequency    Min 3X/week      PT Plan Current plan remains appropriate    Co-evaluation              AM-PAC PT "6 Clicks" Mobility   Outcome Measure  Help needed turning from your back to your side while in a flat bed without using bedrails?: A Lot Help needed moving from lying on your back to sitting on the side of a flat bed without using bedrails?: A Lot Help needed moving to and from a bed to a chair (including a wheelchair)?: A Lot Help needed standing up from a chair using your arms (e.g., wheelchair or bedside chair)?: A Lot Help needed to walk in hospital room?: A Lot Help needed climbing 3-5 steps with a railing? : Total 6 Click Score: 11    End of Session Equipment Utilized During Treatment: Gait belt Activity Tolerance: Patient limited by fatigue Patient left: in bed;with call bell/phone within reach;with SCD's reapplied(bed in chair position) Nurse Communication: Mobility status PT Visit Diagnosis: Other abnormalities of gait and mobility (R26.89);Muscle weakness (generalized) (M62.81)     Time: [id number] PT Time Calculation (min) (ACUTE ONLY): 39 min  Charges:  $Therapeutic Activity: 38-52 mins                     Earney Navy, PTA Acute Rehabilitation Services Pager: [phone] Office: [phone]     Darliss Cheney 02/03/2019, 4:03 PM

## 2019-02-04 ENCOUNTER — Encounter (HOSPITAL_COMMUNITY)
Admission: EM | Disposition: A | Discharge status: Home Health | Payer: Self-pay | Source: Home / Self Care | Attending: Internal Medicine

## 2019-02-04 ENCOUNTER — Encounter (HOSPITAL_COMMUNITY): Payer: Self-pay | Admitting: Anesthesiology

## 2019-02-04 ENCOUNTER — Inpatient Hospital Stay (HOSPITAL_COMMUNITY): Payer: Medicare Other | Admitting: Anesthesiology

## 2019-02-04 DIAGNOSIS — Z6836 Body mass index (BMI) 36.0-36.9, adult: Secondary | ICD-10-CM

## 2019-02-04 HISTORY — PX: AV FISTULA PLACEMENT: SHX1204

## 2019-02-04 LAB — CBC
HCT: 35.9 % — ABNORMAL LOW (ref 36.0–46.0)
Hemoglobin: 10.9 g/dL — ABNORMAL LOW (ref 12.0–15.0)
MCH: 33.3 pg (ref 26.0–34.0)
MCHC: 30.4 g/dL (ref 30.0–36.0)
MCV: 109.8 fL — ABNORMAL HIGH (ref 80.0–100.0)
Platelets: 177 10*3/uL (ref 150–400)
RBC: 3.27 MIL/uL — ABNORMAL LOW (ref 3.87–5.11)
RDW: 16 % — ABNORMAL HIGH (ref 11.5–15.5)
WBC: 4.9 10*3/uL (ref 4.0–10.5)
nRBC: 0 % (ref 0.0–0.2)

## 2019-02-04 LAB — RENAL FUNCTION PANEL
Albumin: 2.7 g/dL — ABNORMAL LOW (ref 3.5–5.0)
Anion gap: 15 (ref 5–15)
BUN: 64 mg/dL — ABNORMAL HIGH (ref 8–23)
CO2: 23 mmol/L (ref 22–32)
Calcium: 8.1 mg/dL — ABNORMAL LOW (ref 8.9–10.3)
Chloride: 99 mmol/L (ref 98–111)
Creatinine, Ser: 2.91 mg/dL — ABNORMAL HIGH (ref 0.44–1.00)
GFR calc Af Amer: 17 mL/min — ABNORMAL LOW (ref >60)
GFR calc non Af Amer: 15 mL/min — ABNORMAL LOW (ref >60)
Glucose, Bld: 101 mg/dL — ABNORMAL HIGH (ref 70–99)
Phosphorus: 3.8 mg/dL (ref 2.5–4.6)
Potassium: 4.2 mmol/L (ref 3.5–5.1)
Sodium: 137 mmol/L (ref 135–145)

## 2019-02-04 LAB — MAGNESIUM: Magnesium: 2.1 mg/dL (ref 1.7–2.4)

## 2019-02-04 LAB — APTT
aPTT: 75 seconds — ABNORMAL HIGH (ref 24–36)
aPTT: 53 seconds — ABNORMAL HIGH (ref 24–36)

## 2019-02-04 LAB — HEPARIN LEVEL (UNFRACTIONATED): Heparin Unfractionated: 1.74 IU/mL — ABNORMAL HIGH (ref 0.30–0.70)

## 2019-02-04 SURGERY — ARTERIOVENOUS (AV) FISTULA CREATION
Anesthesia: Choice | Laterality: Right

## 2019-02-04 MED ORDER — PAPAVERINE HCL 30 MG/ML IJ SOLN
INTRAMUSCULAR | Status: AC
  Filled 2019-02-04: qty 2

## 2019-02-04 MED ORDER — PROPOFOL 10 MG/ML IV BOLUS
INTRAVENOUS | Status: DC | PRN
  Administered 2019-02-04 (×4): 10 mg via INTRAVENOUS

## 2019-02-04 MED ORDER — PROMETHAZINE HCL 25 MG/ML IJ SOLN: 6.2500 mg | INTRAMUSCULAR | Status: DC | PRN

## 2019-02-04 MED ORDER — FENTANYL CITRATE (PF) 100 MCG/2ML IJ SOLN: 25.0000 ug | INTRAMUSCULAR | Status: DC | PRN

## 2019-02-04 MED ORDER — LIDOCAINE 2% (20 MG/ML) 5 ML SYRINGE
INTRAMUSCULAR | Status: AC
  Filled 2019-02-04: qty 5

## 2019-02-04 MED ORDER — 0.9 % SODIUM CHLORIDE (POUR BTL) OPTIME
TOPICAL | Status: DC | PRN
  Administered 2019-02-04: 1000 mL

## 2019-02-04 MED ORDER — FENTANYL CITRATE (PF) 250 MCG/5ML IJ SOLN
INTRAMUSCULAR | Status: AC
  Filled 2019-02-04: qty 5

## 2019-02-04 MED ORDER — HEPARIN (PORCINE) 25000 UT/250ML-% IV SOLN
1400.0000 [IU]/h | INTRAVENOUS | Status: DC
  Administered 2019-02-04 (×2): 1250 [IU]/h via INTRAVENOUS
  Filled 2019-02-04: qty 250

## 2019-02-04 MED ORDER — ONDANSETRON HCL 4 MG/2ML IJ SOLN
INTRAMUSCULAR | Status: DC | PRN
  Administered 2019-02-04: 4 mg via INTRAVENOUS

## 2019-02-04 MED ORDER — LIDOCAINE HCL 1 % IJ SOLN
INTRAMUSCULAR | Status: DC | PRN
  Administered 2019-02-04: 30 mL

## 2019-02-04 MED ORDER — LIDOCAINE-EPINEPHRINE 0.5 %-1:200000 IJ SOLN
INTRAMUSCULAR | Status: AC
  Filled 2019-02-04: qty 1

## 2019-02-04 MED ORDER — SODIUM CHLORIDE 0.9 % IV SOLN
INTRAVENOUS | Status: DC | PRN
  Administered 2019-02-04: 07:00:00 via INTRAVENOUS

## 2019-02-04 MED ORDER — ONDANSETRON HCL 4 MG/2ML IJ SOLN
INTRAMUSCULAR | Status: AC
  Filled 2019-02-04: qty 2

## 2019-02-04 MED ORDER — CEFAZOLIN SODIUM-DEXTROSE 2-3 GM-%(50ML) IV SOLR
INTRAVENOUS | Status: DC | PRN
  Administered 2019-02-04: 2 g via INTRAVENOUS

## 2019-02-04 MED ORDER — FENTANYL CITRATE (PF) 100 MCG/2ML IJ SOLN
INTRAMUSCULAR | Status: DC | PRN
  Administered 2019-02-04 (×6): 25 ug via INTRAVENOUS

## 2019-02-04 MED ORDER — LIDOCAINE HCL (PF) 1 % IJ SOLN
INTRAMUSCULAR | Status: AC
  Filled 2019-02-04: qty 30

## 2019-02-04 MED ORDER — OXYCODONE-ACETAMINOPHEN 5-325 MG PO TABS
1.0000 | ORAL_TABLET | Freq: Four times a day (QID) | ORAL | Status: DC | PRN
  Filled 2019-02-04: qty 1

## 2019-02-04 MED ORDER — SODIUM CHLORIDE 0.9 % IV SOLN
INTRAVENOUS | Status: AC
  Filled 2019-02-04: qty 1.2

## 2019-02-04 MED ORDER — SODIUM CHLORIDE 0.9 % IV SOLN
INTRAVENOUS | Status: DC | PRN
  Administered 2019-02-04: 500 mL

## 2019-02-04 MED ORDER — PROPOFOL 10 MG/ML IV BOLUS
INTRAVENOUS | Status: AC
  Filled 2019-02-04: qty 40

## 2019-02-04 MED ORDER — LIDOCAINE 2% (20 MG/ML) 5 ML SYRINGE
INTRAMUSCULAR | Status: DC | PRN
  Administered 2019-02-04: 20 mg via INTRAVENOUS
  Administered 2019-02-04: 40 mg via INTRAVENOUS

## 2019-02-04 SURGICAL SUPPLY — 34 items
ARMBAND PINK RESTRICT EXTREMIT (MISCELLANEOUS) ×2 IMPLANT
CANISTER SUCT 3000ML PPV (MISCELLANEOUS) ×2 IMPLANT
CLIP VESOCCLUDE MED 6/CT (CLIP) ×2 IMPLANT
CLIP VESOCCLUDE SM WIDE 6/CT (CLIP) ×2 IMPLANT
COVER PROBE W GEL 5X96 (DRAPES) ×2 IMPLANT
COVER WAND RF STERILE (DRAPES) ×2 IMPLANT
DERMABOND ADVANCED (GAUZE/BANDAGES/DRESSINGS) ×1
DERMABOND ADVANCED .7 DNX12 (GAUZE/BANDAGES/DRESSINGS) ×1 IMPLANT
ELECT REM PT RETURN 9FT ADLT (ELECTROSURGICAL) ×2
ELECTRODE REM PT RTRN 9FT ADLT (ELECTROSURGICAL) ×1 IMPLANT
GLOVE BIO SURGEON STRL SZ7.5 (GLOVE) ×2 IMPLANT
GLOVE BIOGEL M 6.5 STRL (GLOVE) ×4 IMPLANT
GLOVE BIOGEL M STRL SZ7.5 (GLOVE) ×2 IMPLANT
GLOVE BIOGEL PI IND STRL 7.0 (GLOVE) ×1 IMPLANT
GLOVE BIOGEL PI IND STRL 7.5 (GLOVE) ×1 IMPLANT
GLOVE BIOGEL PI INDICATOR 7.0 (GLOVE) ×1
GLOVE BIOGEL PI INDICATOR 7.5 (GLOVE) ×1
GOWN STRL REUS W/ TWL LRG LVL3 (GOWN DISPOSABLE) ×2 IMPLANT
GOWN STRL REUS W/ TWL XL LVL3 (GOWN DISPOSABLE) ×1 IMPLANT
GOWN STRL REUS W/TWL LRG LVL3 (GOWN DISPOSABLE) ×2
GOWN STRL REUS W/TWL XL LVL3 (GOWN DISPOSABLE) ×1
INSERT FOGARTY SM (MISCELLANEOUS) IMPLANT
KIT BASIN OR (CUSTOM PROCEDURE TRAY) ×2 IMPLANT
KIT TURNOVER KIT B (KITS) ×2 IMPLANT
NS IRRIG 1000ML POUR BTL (IV SOLUTION) ×2 IMPLANT
PACK CV ACCESS (CUSTOM PROCEDURE TRAY) ×2 IMPLANT
PAD ARMBOARD 7.5X6 YLW CONV (MISCELLANEOUS) ×4 IMPLANT
SUT MNCRL AB 4-0 PS2 18 (SUTURE) ×2 IMPLANT
SUT PROLENE 6 0 BV (SUTURE) ×2 IMPLANT
SUT VIC AB 3-0 SH 27 (SUTURE) ×1
SUT VIC AB 3-0 SH 27X BRD (SUTURE) ×1 IMPLANT
TOWEL GREEN STERILE (TOWEL DISPOSABLE) ×2 IMPLANT
UNDERPAD 30X30 (UNDERPADS AND DIAPERS) ×2 IMPLANT
WATER STERILE IRR 1000ML POUR (IV SOLUTION) ×2 IMPLANT

## 2019-02-04 NOTE — Progress Notes (Signed)
  Progress Note    02/04/2019 7:23 AM Day of Surgery  Subjective:  No overnight issues  Vitals:   02/03/19 2100 02/04/19 0320  BP: 103/61 100/63  Pulse:  70  Resp:  20  Temp:  97.9 F (36.6 C)  SpO2:  94%    Physical Exam: aaox3 Bruising right antecubitum 2+ right radial pulse  CBC    Component Value Date/Time   WBC 4.9 02/04/2019 0453   RBC 3.27 (L) 02/04/2019 0453   HGB 10.9 (L) 02/04/2019 0453   HGB 9.9 (L) 12/25/2018 1134   HCT 35.9 (L) 02/04/2019 0453   HCT 29.6 (L) 12/25/2018 1134   PLT 177 02/04/2019 0453   PLT 151 12/25/2018 1134   MCV 109.8 (H) 02/04/2019 0453   MCV 103 (H) 12/25/2018 1134   MCH 33.3 02/04/2019 0453   MCHC 30.4 02/04/2019 0453   RDW 16.0 (H) 02/04/2019 0453   RDW 15.2 12/25/2018 1134   LYMPHSABS 0.4 (L) 01/31/2019 0441   LYMPHSABS 0.3 (L) 12/25/2018 1134   MONOABS 0.7 01/31/2019 0441   EOSABS 0.2 01/31/2019 0441   EOSABS 0.0 12/25/2018 1134   BASOSABS 0.0 01/31/2019 0441   BASOSABS 0.0 12/25/2018 1134    BMET    Component Value Date/Time   NA 137 02/04/2019 0453   NA 138 12/25/2018 1134   K 4.2 02/04/2019 0453   CL 99 02/04/2019 0453   CO2 23 02/04/2019 0453   GLUCOSE 101 (H) 02/04/2019 0453   BUN 64 (H) 02/04/2019 0453   BUN 99 (HH) 12/25/2018 1134   CREATININE 2.91 (H) 02/04/2019 0453   CALCIUM 8.1 (L) 02/04/2019 0453   GFRNONAA 15 (L) 02/04/2019 0453   GFRAA 17 (L) 02/04/2019 0453    INR    Component Value Date/Time   INR 1.4 (H) 02/01/2019 0505     Intake/Output Summary (Last 24 hours) at 02/04/2019 0723 Last data filed at 02/04/2019 0326 Gross per 24 hour  Intake 440.9 ml  Output 1776 ml  Net -1335.1 ml     Assessment/plan:  78 y.o. female is here with esrd. Will plan right arm avf vs graft today in OR. I have reiterated r/b/i and she agrees to proceed.    Eustacio Ellen C. Donzetta Matters, MD [facility] Office: [phone] Pager: [phone]  02/04/2019 7:23 AM

## 2019-02-04 NOTE — Transfer of Care (Signed)
Immediate Anesthesia Transfer of Care Note  Patient: KEMARIA DEDIC  Procedure(s) Performed: ARTERIOVENOUS (AV) FISTULA CREATION (Right )  Patient Location: PACU  Anesthesia Type:MAC  Level of Consciousness: awake and alert   Airway & Oxygen Therapy: Patient Spontanous Breathing and Patient connected to face mask oxygen  Post-op Assessment: Report given to RN and Post -op Vital signs reviewed and stable  Post vital signs: Reviewed and stable  Last Vitals:  Vitals Value Taken Time  BP    Temp    Pulse 72 02/04/19 0839  Resp 21 02/04/19 0839  SpO2 98 % 02/04/19 0839  Vitals shown include unvalidated device data.  Last Pain:  Vitals:   02/04/19 0320  TempSrc: Oral  PainSc:       Patients Stated Pain Goal: 0 (93/23/55 7322)  Complications: No apparent anesthesia complications

## 2019-02-04 NOTE — Op Note (Signed)
    Patient name: Gina [name] MRN: [id number] DOB: 1940/12/25 Sex: female  02/04/2019 Pre-operative Diagnosis: Chronic kidney disease stage V Post-operative diagnosis:  Same Surgeon:  Eda Paschal. Donzetta Matters, MD Assistant: Leontine Locket, PA Procedure Performed:  Right arm first stage basilic vein AV fistula creation  Indications: 78 year old female now with chronic kidney disease stage V secondary to cardiorenal syndrome.  She has been dialyzing via right sided catheter.  She is now indicated for permanent access.  She has a known left-sided occluded subclavian vein is indicated for right arm access.  She is right-hand dominant appears to have suitable basilic or cephalic veins on the right.  Findings: Cephalic vein on the right had recently placed IV and had acute thrombus of the antecubitum.  The cephalic vein was much smaller more cephalad in the arm possibly secondary to the acute thrombus distally.  The basilic vein was quite large this was easily dilated to 4 mm.  There was an aberrant takeoff of the radial artery.  We sewed it to the primary brachial artery into side.  At completion there is a strong thrill and there is a palpable radial pulse the wrist both confirmed with Doppler.   Procedure:  The patient was identified in the holding area and taken to the operating room where she is placed upon operative MAC anesthesia induced.  She was sterilely prepped and draped in the right upper extremity usual fashion antibiotics were minister and timeout was called.  Ultrasound was used to identify the cephalic vein which appeared to have some acute thrombus of the antecubitum the basilic vein was large throughout its course in the upper arm.  We made a transverse incision after anesthetizing the area with 1% lidocaine.  We dissected down and identified the vein marked of orientation.  We then identified a much smaller artery divided through the deep fascia identified the much larger brachial artery placed a  vessel loop around this.  The vein was then transected distally and tied off.  It was spatulated dilated to 4 mm flushed with heparinized saline and clamped.  The arteries clamped distally proximally opened longitudinally flushed with heparinized saline both directions.  The vein was then sewn inside with 6-0 Prolene suture.  Prior to completion without flushing in all directions.  Upon completion there was good thrill in the vein.  There is a palpable radial pulse at the wrist.  Both these were confirmed with Doppler.  We irrigated the wound thoroughly.  We obtain hemostasis closed in layers with Vicryl and Monocryl.  Dermabond was placed at the level of the skin.  She was awake from anesthesia having tolerated procedure well A complication.  All counts were correct at completion.  EBL: 20 cc   Jacobo Moncrief C. Donzetta Matters, MD [facility] Office: [phone] Pager: [phone]

## 2019-02-04 NOTE — Anesthesia Procedure Notes (Signed)
Procedure Name: MAC Date/Time: 02/04/2019 7:55 AM Performed by: Lieutenant Diego, CRNA Pre-anesthesia Checklist: Patient identified, Emergency Drugs available, Suction available, Patient being monitored and Timeout performed Patient Re-evaluated:Patient Re-evaluated prior to induction Oxygen Delivery Method: Simple face mask Preoxygenation: Pre-oxygenation with 100% oxygen Induction Type: IV induction

## 2019-02-04 NOTE — Progress Notes (Signed)
Renal Navigator requested update on OP HD clinic acceptance, which is still pending at this time.  Alphonzo Cruise, [place] [facility]al Navigator [phone]

## 2019-02-04 NOTE — Progress Notes (Signed)
Occupational Therapy Treatment Patient Details Name: Gina [name] MRN: [id number] DOB: October 01, 1940 Today's Date: 02/04/2019    History of present illness Pt is a 78 y.o. female admitted 01/27/19 with worsening SOB; worked up for acute on chronic HF exacerbation. S/p RIJ TDC placement 11/30 and HD initiated. Per vascular, potential for sx 12/3 for permanent access. PMH includes HF, CKD V, afib s/p Saint Jude PPM, HTN, OSA on CPAP.   OT comments  Pt making slow but steady progress towards OT goals this session. Continue to work on functional transfer training as pt continues to want to DC home. Pt unable to sit>stand from EOB without MOD A +2 for heavily elevated surface. Pt complete stand pivot to [facility] with MIN A with RW. Pt required total A for posterior pericare after BM. Pt requires total A for LB ADL from EOB. DC plan remains appropriate, but will continue to follow acutely and update DC Plan as pt progresses.    Follow Up Recommendations  SNF;Supervision/Assistance - 24 hour    Equipment Recommendations  3 in 1 bedside commode    Recommendations for Other Services      Precautions / Restrictions Precautions Precautions: Fall Restrictions Weight Bearing Restrictions: No       Mobility Bed Mobility Overal bed mobility: Needs Assistance Bed Mobility: Supine to Sit     Supine to sit: Mod assist;HOB elevated;+2 for physical assistance     General bed mobility comments: Pt noted to know what she needs to do but requires cues and assist to initiate task. MOD A +2 to maneuver BLEs to EOB and scoot hips to EOB  Transfers Overall transfer level: Needs assistance Equipment used: Rolling walker (2 wheeled) Transfers: Sit to/from [facility] Sit to Stand: Mod assist;+2 physical assistance;+2 safety/equipment;Supervision;From elevated surface Stand pivot transfers: Min assist;+2 safety/equipment       General transfer comment: pt attempted sit>stand from EOB with  RW x3 with no success. pt reports needing to go to [facility], pt able to achieve full sit>stand with heavily elevated HOB and MOD A +2 for sit>stand. Pt completed stand pivot to [facility] with RW and MIN A for balance and technique. Pt able to stand from [facility] with supervision with cues for hand placement    Balance Overall balance assessment: Needs assistance Sitting-balance support: Single extremity supported;Feet supported Sitting balance-Leahy Scale: Poor Sitting balance - Comments: relaint on at least 1 UE support seated EOB min guard to close supervision   Standing balance support: Bilateral upper extremity supported;During functional activity Standing balance-Leahy Scale: Poor                             ADL either performed or assessed with clinical judgement   ADL Overall ADL's : Needs assistance/impaired                     Lower Body Dressing: Total assistance;Sitting/lateral leans Lower Body Dressing Details (indicate cue type and reason): total A to don shoes at EOB Toilet Transfer: Minimal assistance;+2 for safety/equipment;BSC;RW;Stand-pivot Toilet Transfer Details (indicate cue type and reason): MIN A for balance and safety Toileting- Clothing Manipulation and Hygiene: Total assistance;Sit to/from stand       Functional mobility during ADLs: Minimal assistance;+2 for safety/equipment;Rolling walker General ADL Comments: pt limited by weakness, impaired balance and decreased activity tolerance. Session focus on functional transfer training, and toilet transfer/ tasks     Vision   Vision Assessment?:  No apparent visual deficits   Perception     Praxis      Cognition Arousal/Alertness: Awake/alert Behavior During Therapy: WFL for tasks assessed/performed Overall Cognitive Status: No family/caregiver present to determine baseline cognitive functioning Area of Impairment: Attention;Safety/judgement;Awareness;Problem solving;Following commands                    Current Attention Level: Selective   Following Commands: Follows one step commands consistently;Follows one step commands with increased time Safety/Judgement: Decreased awareness of deficits Awareness: Emergent Problem Solving: Requires verbal cues;Decreased initiation          Exercises     Shoulder Instructions       General Comments pt on RA throughout session with SpO2 > 90%     Pertinent Vitals/ Pain       Pain Assessment: No/denies pain  Home Living                                          Prior Functioning/Environment              Frequency  Min 2X/week        Progress Toward Goals  OT Goals(current goals can now be found in the care plan section)  Progress towards OT goals: Progressing toward goals  Acute Rehab OT Goals Patient Stated Goal: Hopeful for return home instead of needing SNF OT Goal Formulation: With patient Time For Goal Achievement: 02/16/19 Potential to Achieve Goals: Good  Plan Discharge plan remains appropriate    Co-evaluation    PT/OT/SLP Co-Evaluation/Treatment: Yes Reason for Co-Treatment: Necessary to address cognition/behavior during functional activity;Complexity of the patient's impairments (multi-system involvement);To address functional/ADL transfers;For patient/therapist safety   OT goals addressed during session: ADL's and self-care;Proper use of Adaptive equipment and DME      AM-PAC OT "6 Clicks" Daily Activity     Outcome Measure   Help from another person eating meals?: A Little Help from another person taking care of personal grooming?: A Little Help from another person toileting, which includes using toliet, bedpan, or urinal?: Total Help from another person bathing (including washing, rinsing, drying)?: A Lot Help from another person to put on and taking off regular upper body clothing?: A Little Help from another person to put on and taking off regular lower body clothing?: A  Lot 6 Click Score: 14    End of Session Equipment Utilized During Treatment: Gait belt;Rolling walker;Other (comment)(BSC)  OT Visit Diagnosis: Other abnormalities of gait and mobility (R26.89);Pain;Muscle weakness (generalized) (M62.81) Pain - Right/Left: Left Pain - part of body: Knee   Activity Tolerance Patient tolerated treatment well   Patient Left in chair;with call bell/phone within reach;with chair alarm set   Nurse Communication Mobility status        Time: [id number] OT Time Calculation (min): 37 min  Charges: OT General Charges $OT Visit: 1 Visit OT Treatments $Self Care/Home Management : 8-22 mins  Lanier Clam., COTA/L Acute Rehabilitation Services [phone] [place] 02/04/2019, 4:27 PM

## 2019-02-04 NOTE — Progress Notes (Signed)
ANTICOAGULATION CONSULT NOTE - Follow Up Consult  Pharmacy Consult for Heparin (Eliquis on hold) Indication: atrial fibrillation  Allergies  Allergen Reactions  . Bactrim [Sulfamethoxazole-Trimethoprim] Other (See Comments)    Flu like symptoms  . Cephalexin Rash    REACTION: Rash to arms/legs    Patient Measurements: Height: 5\' 4"  (162.6 cm) Weight: 211 lb 6.7 oz (95.9 kg) IBW/kg (Calculated) : 54.7 Heparin Dosing Weight: 79 kg  Vital Signs: Temp: 98.1 F (36.7 C) (12/03 1934) Temp Source: Oral (12/03 1934) BP: 102/57 (12/03 1934) Pulse Rate: 70 (12/03 1934)  Labs: Recent Labs    02/03/19 0419 02/03/19 1120 02/03/19 1610 02/04/19 0453 02/04/19 1952  HGB 10.4*  --  10.4* 10.9*  --   HCT 33.6*  --  33.4* 35.9*  --   PLT 187  --  189 177  --   APTT 57*  --  73* 75* 53*  HEPARINUNFRC >2.20*  --   --  1.74*  --   CREATININE 3.04* 1.93*  --  2.91*  --    Assessment:  89 yoF admitted with progressive CHF and CKD. Pt is on Eliquis PTA for hx PAF which was resumed 11/30 after holding for temporary HD access. VVS consulted for permanent access, planned for 12/3 am. Pharmacy asked to start heparin bridge on 12/1. Last dose of Eliquis was 12/1 ~0900.  Heparin level remains falsely elevated this am due to recent Eliquis doses, so following aPTT.  lvl this pm low at 53  Goal of Therapy:  Heparin level 0.3-0.7 units/ml aPTT 66-102 seconds Monitor platelets by anticoagulation protocol: Yes   Plan:  Increase heparin to 1400 units/hr Check hep lvl aptt cbc in am  Barth Kirks, PharmD, BCPS, BCCCP Clinical Pharmacist [phone]  Please check AMION for all [place] numbers  02/04/2019 8:32 PM

## 2019-02-04 NOTE — Anesthesia Postprocedure Evaluation (Signed)
Anesthesia Post Note  Patient: Gina [name]  Procedure(s) Performed: ARTERIOVENOUS (AV) FISTULA CREATION (Right )     Patient location during evaluation: PACU Anesthesia Type: General Level of consciousness: sedated Pain management: pain level controlled Vital Signs Assessment: post-procedure vital signs reviewed and stable Respiratory status: spontaneous breathing and respiratory function stable Cardiovascular status: stable Postop Assessment: no apparent nausea or vomiting Anesthetic complications: no    Last Vitals:  Vitals:   02/04/19 0840 02/04/19 0910  BP:  98/79  Pulse:  70  Resp:  18  Temp: 36.4 C 36.5 C  SpO2:  100%    Last Pain:  Vitals:   02/04/19 0910  TempSrc: Oral  PainSc:                  [name] DANIEL

## 2019-02-04 NOTE — Progress Notes (Signed)
Patient placed self on CPAP.  RT assistance not needed at this time. 

## 2019-02-04 NOTE — Progress Notes (Signed)
Received pt. Post  R arm AV Fistula placement.  Site CDI positive bruit and thrill. Patient alert and oriented no complaints of any pain or discomfort. Will monitor.

## 2019-02-04 NOTE — Progress Notes (Signed)
Physical Therapy Treatment Patient Details Name: Gina [name] MRN: [id number] DOB: 06-12-1940 Today's Date: 02/04/2019    History of Present Illness Pt is a 78 y.o. female admitted 01/27/19 with worsening SOB; worked up for acute on chronic HF exacerbation. S/p RIJ TDC placement 11/30 and HD initiated. Per vascular, potential for sx 12/3 for permanent access. PMH includes HF, CKD V, afib s/p Saint Jude PPM, HTN, OSA on CPAP.    PT Comments    Patient seen for mobility progression. Pt is making gradual progress toward PT goals. Pt requires mod A +2 to stand from significantly elevated bed height and min A for stand pivot and short distance gait of ~5 ft to recliner. VSS on RA however pt with SOB while mobilizing. Continue to progress as tolerated.     Follow Up Recommendations  SNF;Supervision for mobility/OOB     Equipment Recommendations  Other (comment)(TBD next venue)    Recommendations for Other Services       Precautions / Restrictions Precautions Precautions: Fall Restrictions Weight Bearing Restrictions: No    Mobility  Bed Mobility Overal bed mobility: Needs Assistance Bed Mobility: Supine to Sit     Supine to sit: Mod assist;HOB elevated;+2 for physical assistance     General bed mobility comments: Pt noted to know what she needs to do but requires cues and assist to initiate task. MOD A +2 to maneuver BLEs to EOB and scoot hips to EOB  Transfers Overall transfer level: Needs assistance Equipment used: Rolling walker (2 wheeled) Transfers: Sit to/from [facility] Sit to Stand: Mod assist;+2 physical assistance;+2 safety/equipment;Supervision;From elevated surface Stand pivot transfers: Min assist;+2 safety/equipment       General transfer comment: pt attempted sit>stand from EOB with RW x3 with no success. pt reports needing to go to [facility], pt able to achieve full sit>stand with heavily elevated bed height and MOD A +2 for sit>stand. Pt  completed stand pivot to [facility] with RW and MIN A for balance and technique. Pt able to stand from [facility] with supervision with cues for hand placement  Ambulation/Gait Ambulation/Gait assistance: Min assist Gait Distance (Feet): 5 Feet Assistive device: Rolling walker (2 wheeled) Gait Pattern/deviations: Step-to pattern;Trunk flexed;Decreased stride length;Shuffle Gait velocity: Decreased   General Gait Details: cues for upright posture and asssist to steady and manage RW    Stairs             Wheelchair Mobility    Modified Rankin (Stroke Patients Only)       Balance Overall balance assessment: Needs assistance Sitting-balance support: Single extremity supported;Feet supported Sitting balance-Leahy Scale: Poor Sitting balance - Comments: relaint on at least 1 UE support seated EOB min guard to close supervision   Standing balance support: Bilateral upper extremity supported;During functional activity Standing balance-Leahy Scale: Poor                              Cognition Arousal/Alertness: Awake/alert Behavior During Therapy: WFL for tasks assessed/performed Overall Cognitive Status: No family/caregiver present to determine baseline cognitive functioning Area of Impairment: Attention;Safety/judgement;Awareness;Problem solving;Following commands                   Current Attention Level: Selective   Following Commands: Follows one step commands consistently;Follows one step commands with increased time Safety/Judgement: Decreased awareness of deficits Awareness: Emergent Problem Solving: Requires verbal cues;Decreased initiation;Slow processing;Difficulty sequencing        Exercises  General Comments General comments (skin integrity, edema, etc.): SpO2 >94% on RA throughout session      Pertinent Vitals/Pain Pain Assessment: No/denies pain    Home Living                      Prior Function            PT Goals (current  goals can now be found in the care plan section) Acute Rehab PT Goals Patient Stated Goal: Hopeful for return home instead of needing SNF Progress towards PT goals: Progressing toward goals    Frequency    Min 3X/week      PT Plan Current plan remains appropriate    Co-evaluation PT/OT/SLP Co-Evaluation/Treatment: Yes Reason for Co-Treatment: For patient/therapist safety;To address functional/ADL transfers PT goals addressed during session: Mobility/safety with mobility OT goals addressed during session: ADL's and self-care;Proper use of Adaptive equipment and DME      AM-PAC PT "6 Clicks" Mobility   Outcome Measure  Help needed turning from your back to your side while in a flat bed without using bedrails?: A Lot Help needed moving from lying on your back to sitting on the side of a flat bed without using bedrails?: A Lot Help needed moving to and from a bed to a chair (including a wheelchair)?: A Lot Help needed standing up from a chair using your arms (e.g., wheelchair or bedside chair)?: A Lot Help needed to walk in hospital room?: A Little Help needed climbing 3-5 steps with a railing? : Total 6 Click Score: 12    End of Session Equipment Utilized During Treatment: Gait belt Activity Tolerance: Patient tolerated treatment well Patient left: with call bell/phone within reach;in chair;with chair alarm set Nurse Communication: Mobility status PT Visit Diagnosis: Other abnormalities of gait and mobility (R26.89);Muscle weakness (generalized) (M62.81)     Time: [id number] PT Time Calculation (min) (ACUTE ONLY): 36 min  Charges:  $Gait Training: 8-22 mins                     Earney Navy, PTA Acute Rehabilitation Services Pager: [phone] Office: [phone]     Darliss Cheney 02/04/2019, 5:23 PM

## 2019-02-04 NOTE — Progress Notes (Signed)
Spoke to patient's Niece- Debby Bud @ [phone]. She stated to call her for any updates or concerns since patient's spouse forgets as per Truman Hayward.. Will pass on.

## 2019-02-04 NOTE — Progress Notes (Signed)
PROGRESS NOTE    Gina [name]  [id number] DOB: 07-Nov-1940 DOA: 01/27/2019 PCP: Glenda Chroman, MD   Brief Narrative: 78 year old with past medical history significant for chronic systolic heart failure due to nonischemic cardiomyopathy with ejection fraction of 20%, left bundle branch block, a stage V chronic kidney disease, A. fib status post AV nodal ablation s/p  [facility] PPM in 2019 on Eliquis, anemia of chronic disease, hypertension, renal artery stenosis, obstructive sleep apnea on CPAP who presents to the emergency department complaining of worsening shortness of breath for 3 weeks prior to admission.  Patient was evaluated at the heart failure clinic the morning of admission and was referred to the ED for admission.  Patient reported weight gain, shortness of breath on exertion.  She has been compliant with her diuretics.   Patient was admitted for acute on chronic systolic heart failure exacerbation.  She was a started on IV Lexis.  Nephrology was consulted as the patient is nearing initiation of dialysis.  Patient underwent tunnel catheter placement on 02/01/2019, with the plan of initiation hemodialysis.  Plan is for removal by cardiology of atrial -lead for PPM with placement of LV lead after fistula is mature and in a stable use. Patient has not been out of bed entire hospitalization so will need PT eval.   Assessment & Plan:   Principal Problem:   Acute on chronic congestive heart failure (HCC) Active Problems:   Hypertension   Mitral regurgitation   Anemia of chronic disease   OSA (obstructive sleep apnea)   Atrial fibrillation with RVR (HCC)   Pacemaker   Hypothyroidism   CKD (chronic kidney disease) stage 5, GFR less than 15 ml/min (HCC)   PVC (premature ventricular contraction)   Pressure injury of skin   Acute on chronic systolic congestive heart failure exacerbation: -Cardiology and nephrology has been helping with patient care during this  admission. -Patient was treated with IV lasix 100 mg IV BID. Lasix has been discontinue, now that patient will start HD -An attempt was made by cardiology on 12/2018 to place an LV lead in the patient with removal of lateral leads to improve her output.  This was unsuccessful due to upper extremity thrombosis.  The plan now is for the patient to return for this procedure after her AV fistula is mature and  stable -started on HD 11/30  Stage V chronic kidney disease, progression of end-stage renal disease: -Nephrology was consulted -Underwent tunnel catheter placement on 11/30. -HD started 11/30 -s/p vascular placement on 12/3  Hyperkalemia: Resolved  left knee pain: Knee erythematosus swollen.  Patient was evaluated by orthopedic who is recommending local Aspercreme.  Weightbearing as tolerated.  Follow-up with Dr. Benjaman Kindler in 2 to 4 weeks.  A. fib with RVR: Eliquis was held for tunneled catheter placement.    Continue with carvedilol. -amiodarone -heparin-- resume eliquis when ok with vascular  Anemia of chronic disease: With mild iron deficiency anemia: Monitor hemoglobin.  Hypothyroidism: Continue with levothyroxine  Obstructive  sleep apnea:  -Continue with CPAP   Pressure Injury 01/27/19 Buttocks Right;Left Stage I -  Intact skin with non-blanchable redness of a localized area usually over a bony prominence. (Active)  01/27/19 1700  Location: Buttocks  Location Orientation: Right;Left  Staging: Stage I -  Intact skin with non-blanchable redness of a localized area usually over a bony prominence.  Wound Description (Comments):   Present on Admission: Yes   Deconditioning -PT eval- ? Need for SNF vs CIR  obesity Estimated body mass index is 36.29 kg/m as calculated from the following:   Height as of this encounter: 5\' 4"  (1.626 m).   Weight as of this encounter: 95.9 kg.   DVT prophylaxis: heparin Code Status: Full code Family Communication: patient and family at  bedside Disposition Plan: asked for PT reeval and CIR? Potentially?  Consultants:   Cardiology  Nephrology  vascular  Procedures:   Hemodialysis tunnel catheter placement    Subjective: Just back from having AVF placed Asking about going to CIR  Objective: Vitals:   02/04/19 0325 02/04/19 0840 02/04/19 0910 02/04/19 1159  BP:   98/79 106/69  Pulse:   70 72  Resp:   18 20  Temp:  97.6 F (36.4 C) 97.7 F (36.5 C) 97.8 F (36.6 C)  TempSrc:   Oral   SpO2:   100% 100%  Weight: 95.9 kg     Height:        Intake/Output Summary (Last 24 hours) at 02/04/2019 1416 Last data filed at 02/04/2019 0802 Gross per 24 hour  Intake 310.9 ml  Output 275 ml  Net 35.9 ml   Filed Weights   02/03/19 0700 02/03/19 0948 02/04/19 0325  Weight: 97.4 kg 95.8 kg 95.9 kg    Examination:  In bed, NAD Pleasant and cooperative Not on O2 No increased work of breathing obese   Data Reviewed: I have personally reviewed following labs and imaging studies  CBC: Recent Labs  Lab 01/31/19 0441 02/01/19 0505 02/03/19 0419 02/03/19 1610 02/04/19 0453  WBC 5.8 7.0 4.6 5.9 4.9  NEUTROABS 4.5  --   --   --   --   HGB 10.2* 10.4* 10.4* 10.4* 10.9*  HCT 32.7* 32.9* 33.6* 33.4* 35.9*  MCV 108.3* 106.5* 107.3* 106.4* 109.8*  PLT 184 193 187 189 [id number]   Basic Metabolic Panel: Recent Labs  Lab 01/31/19 0441  02/01/19 0505 02/02/19 0548 02/03/19 0419 02/03/19 1120 02/04/19 0453  NA 141  --  138 138 137 140 137  K 3.2*   < > 4.0 3.9 3.5 3.8 4.2  CL 95*  --  96* 97* 95* 99 99  CO2 32  --  27 30 27 29 23   GLUCOSE 91  --  95 102* 94 119* 101*  BUN 113*  --  112* 75* 84* 42* 64*  CREATININE 3.56*  --  3.48* 2.82* 3.04* 1.93* 2.91*  CALCIUM 8.4*  --  8.1* 8.4* 8.4* 8.2* 8.1*  MG 2.2  --  2.2 2.2 2.2  --  2.1  PHOS 4.2  --  4.5 4.3 4.8* 2.9 3.8   < > = values in this interval not displayed.   GFR: Estimated Creatinine Clearance: 17.9 mL/min (A) (by C-G formula based on SCr of 2.91  mg/dL (H)). Liver Function Tests: Recent Labs  Lab 02/01/19 0505 02/02/19 0548 02/03/19 0419 02/03/19 1120 02/04/19 0453  ALBUMIN 2.9* 2.9* 2.7* 3.0* 2.7*   No results for input(s): LIPASE, AMYLASE in the last 168 hours. No results for input(s): AMMONIA in the last 168 hours. Coagulation Profile: Recent Labs  Lab 02/01/19 0505  INR 1.4*   Cardiac Enzymes: No results for input(s): CKTOTAL, CKMB, CKMBINDEX, TROPONINI in the last 168 hours. BNP (last 3 results) No results for input(s): PROBNP in the last 8760 hours. HbA1C: No results for input(s): HGBA1C in the last 72 hours. CBG: No results for input(s): GLUCAP in the last 168 hours. Lipid Profile: No results for input(s): CHOL, HDL, LDLCALC,  TRIG, CHOLHDL, LDLDIRECT in the last 72 hours. Thyroid Function Tests: No results for input(s): TSH, T4TOTAL, FREET4, T3FREE, THYROIDAB in the last 72 hours. Anemia Panel: No results for input(s): VITAMINB12, FOLATE, FERRITIN, TIBC, IRON, RETICCTPCT in the last 72 hours. Sepsis Labs: No results for input(s): PROCALCITON, LATICACIDVEN in the last 168 hours.  Recent Results (from the past 240 hour(s))  SARS CORONAVIRUS 2 (TAT 6-24 HRS) Nasopharyngeal Nasopharyngeal Swab     Status: None   Collection Time: 01/27/19  2:53 PM   Specimen: Nasopharyngeal Swab  Result Value Ref Range Status   SARS Coronavirus 2 NEGATIVE NEGATIVE Final    Comment: (NOTE) SARS-CoV-2 target nucleic acids are NOT DETECTED. The SARS-CoV-2 RNA is generally detectable in upper and lower respiratory specimens during the acute phase of infection. Negative results do not preclude SARS-CoV-2 infection, do not rule out co-infections with other pathogens, and should not be used as the sole basis for treatment or other patient management decisions. Negative results must be combined with clinical observations, patient history, and epidemiological information. The expected result is Negative. Fact Sheet for  Patients: SugarRoll.be Fact Sheet for Healthcare Providers: https://www.woods-mathews.com/ This test is not yet approved or cleared by the [place] FDA and  has been authorized for detection and/or diagnosis of SARS-CoV-2 by FDA under an Emergency Use Authorization (EUA). This EUA will remain  in effect (meaning this test can be used) for the duration of the COVID-19 declaration under Section 56 4(b)(1) of the Act, 21 U.S.C. section 360bbb-3(b)(1), unless the authorization is terminated or revoked sooner. Performed at [facility], [place] [address] [place], [place] [address]          Radiology Studies: Vas [place] Upper Ext Vein Mapping (pre-op Avf)  Result Date: 02/02/2019 UPPER EXTREMITY VEIN MAPPING Performing Technologist: Antonieta Pert RDMS, RVT  Examination Guidelines: A complete evaluation includes B-mode imaging, spectral Doppler, color Doppler, and power Doppler as needed of all accessible portions of each vessel. Bilateral testing is considered an integral part of a complete examination. Limited examinations for reoccurring indications may be performed as noted. +-----------------+-------------+----------+--------+  Right Cephalic    Diameter (cm) Depth (cm) Findings  +-----------------+-------------+----------+--------+  Shoulder              0.24         1.25              +-----------------+-------------+----------+--------+  Prox upper arm        0.27         0.71              +-----------------+-------------+----------+--------+  Mid upper arm         0.30         0.70              +-----------------+-------------+----------+--------+  Dist upper arm        0.35         0.36              +-----------------+-------------+----------+--------+  Antecubital fossa     0.65         0.30              +-----------------+-------------+----------+--------+  Prox forearm          0.30         0.34               +-----------------+-------------+----------+--------+  Mid forearm  0.27         0.52              +-----------------+-------------+----------+--------+  Dist forearm          0.16         0.40              +-----------------+-------------+----------+--------+  Wrist                 0.13         0.53              +-----------------+-------------+----------+--------+ +-----------------+-------------+----------+--------------+  Right Basilic     Diameter (cm) Depth (cm)    Findings     +-----------------+-------------+----------+--------------+  Prox upper arm        0.43         1.06                    +-----------------+-------------+----------+--------------+  Mid upper arm         0.42         1.20                    +-----------------+-------------+----------+--------------+  Dist upper arm        0.46         0.85                    +-----------------+-------------+----------+--------------+  Antecubital fossa     0.44         0.60                    +-----------------+-------------+----------+--------------+  Prox forearm          0.21         0.43                    +-----------------+-------------+----------+--------------+  Mid forearm                                not visualized  +-----------------+-------------+----------+--------------+  Distal forearm                             not visualized  +-----------------+-------------+----------+--------------+  Elbow                                      not visualized  +-----------------+-------------+----------+--------------+  Wrist                                      not visualized  +-----------------+-------------+----------+--------------+ +-----------------+-------------+----------+-----------------------------+  Left Cephalic     Diameter (cm) Depth (cm)           Findings             +-----------------+-------------+----------+-----------------------------+  Shoulder              0.10         0.42                                    +-----------------+-------------+----------+-----------------------------+  Prox upper arm        0.13  0.89                                   +-----------------+-------------+----------+-----------------------------+  Mid upper arm         0.12         0.51                                   +-----------------+-------------+----------+-----------------------------+  Dist upper arm        0.14         0.36                                   +-----------------+-------------+----------+-----------------------------+  Antecubital fossa                          not visualized and IV bandage  +-----------------+-------------+----------+-----------------------------+  Prox forearm                                      not visualized          +-----------------+-------------+----------+-----------------------------+  Mid forearm                                       not visualized          +-----------------+-------------+----------+-----------------------------+  Dist forearm                                      not visualized          +-----------------+-------------+----------+-----------------------------+  Wrist                                             not visualized          +-----------------+-------------+----------+-----------------------------+ +-----------------+-------------+----------+--------------+  Left Basilic      Diameter (cm) Depth (cm)    Findings     +-----------------+-------------+----------+--------------+  Shoulder              0.66         1.17                    +-----------------+-------------+----------+--------------+  Prox upper arm        0.51         1.27                    +-----------------+-------------+----------+--------------+  Mid upper arm         0.42         1.10                    +-----------------+-------------+----------+--------------+  Dist upper arm        0.37         1.02      branching     +-----------------+-------------+----------+--------------+  Antecubital  fossa     0.11         0.36                    +-----------------+-------------+----------+--------------+  Prox forearm                               not visualized  +-----------------+-------------+----------+--------------+  Mid forearm                                not visualized  +-----------------+-------------+----------+--------------+  Distal forearm                             not visualized  +-----------------+-------------+----------+--------------+  Elbow                                      not visualized  +-----------------+-------------+----------+--------------+  Wrist                                      not visualized  +-----------------+-------------+----------+--------------+ *See table(s) above for measurements and observations.  Diagnosing physician: Deitra Mayo MD Electronically signed by Deitra Mayo MD on 02/02/2019 at 5:39:49 PM.    Final         Scheduled Meds:  amiodarone  200 mg Oral Daily   carvedilol  12.5 mg Oral BID   Chlorhexidine Gluconate Cloth  6 each Topical Q0600   ferrous sulfate  325 mg Oral Q breakfast   levothyroxine  75 mcg Oral QAC breakfast   Continuous Infusions:  heparin 1,250 Units/hr (02/04/19 1209)   vancomycin       LOS: 8 days    Time spent: 25 minutes.     Geradine Girt, DO [facility]   If 7PM-7AM, please contact night-coverage [email or web address] Password [email or web address] 02/04/2019, 2:16 PM

## 2019-02-04 NOTE — Progress Notes (Signed)
ANTICOAGULATION CONSULT NOTE - Follow Up Consult  Pharmacy Consult for Heparin (Eliquis on hold) Indication: atrial fibrillation  Allergies  Allergen Reactions  . Bactrim [Sulfamethoxazole-Trimethoprim] Other (See Comments)    Flu like symptoms  . Cephalexin Rash    REACTION: Rash to arms/legs    Patient Measurements: Height: 5\' 4"  (162.6 cm) Weight: 211 lb 6.7 oz (95.9 kg) IBW/kg (Calculated) : 54.7 Heparin Dosing Weight: 79 kg  Vital Signs: Temp: 97.7 F (36.5 C) (12/03 0910) Temp Source: Oral (12/03 0910) BP: 98/79 (12/03 0910) Pulse Rate: 70 (12/03 0910)  Labs: Recent Labs    02/03/19 0419 02/03/19 1120 02/03/19 1610 02/04/19 0453  HGB 10.4*  --  10.4* 10.9*  HCT 33.6*  --  33.4* 35.9*  PLT 187  --  189 177  APTT 57*  --  73* 75*  HEPARINUNFRC >2.20*  --   --  1.74*  CREATININE 3.04* 1.93*  --  2.91*   Assessment:  89 yoF admitted with progressive CHF and CKD. Pt is on Eliquis PTA for hx PAF which was resumed 11/30 after holding for temporary HD access. VVS consulted for permanent access, planned for 12/3 am. Pharmacy asked to start heparin bridge on 12/1. Last dose of Eliquis was 12/1 ~0900.  Heparin level remains falsely elevated this am due to recent Eliquis doses, but aPTT in therapeutic range.  S/p AVF in OR this AM.  Pharmacy asked to resume IV heparin this afternoon at noon.  Goal of Therapy:  Heparin level 0.3-0.7 units/ml aPTT 66-102 seconds Monitor platelets by anticoagulation protocol: Yes   Plan:  Resume heparin drip at 1250 units/hr at noon today. Recheck aPTT 8 hrs after gtt starts. Daily aPTT, heparin level, and CBC. Resume Eliquis soon?  Marguerite Olea, [facility] Clinical Pharmacist Phone [phone]  02/04/2019 9:18 AM

## 2019-02-04 NOTE — Progress Notes (Addendum)
Patient ID: Gina [name], female   DOB: Oct 18, 1940, 78 y.o.   MRN: [id number] S: tolerated surgery well, no new complaints O:BP 106/69 (BP Location: Left Arm)   Pulse 72   Temp 97.8 F (36.6 C)   Resp 20   Ht 5\' 4"  (1.626 m)   Wt 95.9 kg   SpO2 100%   BMI 36.29 kg/m   Intake/Output Summary (Last 24 hours) at 02/04/2019 1624 Last data filed at 02/04/2019 0802 Gross per 24 hour  Intake 310.9 ml  Output 200 ml  Net 110.9 ml   Intake/Output: I/O last 3 completed shifts: In: 440.9 [P.O.:180; I.V.:260.9] Out: 2076 [Urine:575; Other:1501]  Intake/Output this shift:  Total I/O In: 50 [I.V.:50] Out: -  Weight change: -3.9 kg Gen: NAD CVS: no rub Resp: cta Abd: +BS, soft, Nt/nd Ext: RUE AVF +T/B, no edema  Recent Labs  Lab 01/30/19 0448 01/30/19 1828 01/31/19 0441 01/31/19 1340 02/01/19 0505 02/02/19 0548 02/03/19 0419 02/03/19 1120 02/04/19 0453  NA 142 139 141  --  138 138 137 140 137  K 3.9 3.5 3.2* 4.2 4.0 3.9 3.5 3.8 4.2  CL 97* 95* 95*  --  96* 97* 95* 99 99  CO2 29 30 32  --  27 30 27 29 23   GLUCOSE 102* 146* 91  --  95 102* 94 119* 101*  BUN 120* 115* 113*  --  112* 75* 84* 42* 64*  CREATININE 4.08* 3.74* 3.56*  --  3.48* 2.82* 3.04* 1.93* 2.91*  ALBUMIN 3.1*  --  3.0*  --  2.9* 2.9* 2.7* 3.0* 2.7*  CALCIUM 8.7* 8.3* 8.4*  --  8.1* 8.4* 8.4* 8.2* 8.1*  PHOS 4.1  --  4.2  --  4.5 4.3 4.8* 2.9 3.8   Liver Function Tests: Recent Labs  Lab 02/03/19 0419 02/03/19 1120 02/04/19 0453  ALBUMIN 2.7* 3.0* 2.7*   No results for input(s): LIPASE, AMYLASE in the last 168 hours. No results for input(s): AMMONIA in the last 168 hours. CBC: Recent Labs  Lab 01/31/19 0441 02/01/19 0505 02/03/19 0419 02/03/19 1610 02/04/19 0453  WBC 5.8 7.0 4.6 5.9 4.9  NEUTROABS 4.5  --   --   --   --   HGB 10.2* 10.4* 10.4* 10.4* 10.9*  HCT 32.7* 32.9* 33.6* 33.4* 35.9*  MCV 108.3* 106.5* 107.3* 106.4* 109.8*  PLT 184 193 187 189 177   Cardiac Enzymes: No results for  input(s): CKTOTAL, CKMB, CKMBINDEX, TROPONINI in the last 168 hours. CBG: No results for input(s): GLUCAP in the last 168 hours.  Iron Studies: No results for input(s): IRON, TIBC, TRANSFERRIN, FERRITIN in the last 72 hours. Studies/Results: Vas [place] Upper Ext Vein Mapping (pre-op Avf)  Result Date: 02/02/2019 UPPER EXTREMITY VEIN MAPPING Performing Technologist: Antonieta Pert RDMS, RVT  Examination Guidelines: A complete evaluation includes B-mode imaging, spectral Doppler, color Doppler, and power Doppler as needed of all accessible portions of each vessel. Bilateral testing is considered an integral part of a complete examination. Limited examinations for reoccurring indications may be performed as noted. +-----------------+-------------+----------+--------+ Right Cephalic   Diameter (cm)Depth (cm)Findings +-----------------+-------------+----------+--------+ Shoulder             0.24        1.25            +-----------------+-------------+----------+--------+ Prox upper arm       0.27        0.71            +-----------------+-------------+----------+--------+ Mid upper arm  0.30        0.70            +-----------------+-------------+----------+--------+ Dist upper arm       0.35        0.36            +-----------------+-------------+----------+--------+ Antecubital fossa    0.65        0.30            +-----------------+-------------+----------+--------+ Prox forearm         0.30        0.34            +-----------------+-------------+----------+--------+ Mid forearm          0.27        0.52            +-----------------+-------------+----------+--------+ Dist forearm         0.16        0.40            +-----------------+-------------+----------+--------+ Wrist                0.13        0.53            +-----------------+-------------+----------+--------+ +-----------------+-------------+----------+--------------+ Right Basilic    Diameter  (cm)Depth (cm)   Findings    +-----------------+-------------+----------+--------------+ Prox upper arm       0.43        1.06                  +-----------------+-------------+----------+--------------+ Mid upper arm        0.42        1.20                  +-----------------+-------------+----------+--------------+ Dist upper arm       0.46        0.85                  +-----------------+-------------+----------+--------------+ Antecubital fossa    0.44        0.60                  +-----------------+-------------+----------+--------------+ Prox forearm         0.21        0.43                  +-----------------+-------------+----------+--------------+ Mid forearm                             not visualized +-----------------+-------------+----------+--------------+ Distal forearm                          not visualized +-----------------+-------------+----------+--------------+ Elbow                                   not visualized +-----------------+-------------+----------+--------------+ Wrist                                   not visualized +-----------------+-------------+----------+--------------+ +-----------------+-------------+----------+-----------------------------+ Left Cephalic    Diameter (cm)Depth (cm)          Findings            +-----------------+-------------+----------+-----------------------------+ Shoulder             0.10        0.42                                 +-----------------+-------------+----------+-----------------------------+  Prox upper arm       0.13        0.89                                 +-----------------+-------------+----------+-----------------------------+ Mid upper arm        0.12        0.51                                 +-----------------+-------------+----------+-----------------------------+ Dist upper arm       0.14        0.36                                  +-----------------+-------------+----------+-----------------------------+ Antecubital fossa                       not visualized and IV bandage +-----------------+-------------+----------+-----------------------------+ Prox forearm                                   not visualized         +-----------------+-------------+----------+-----------------------------+ Mid forearm                                    not visualized         +-----------------+-------------+----------+-----------------------------+ Dist forearm                                   not visualized         +-----------------+-------------+----------+-----------------------------+ Wrist                                          not visualized         +-----------------+-------------+----------+-----------------------------+ +-----------------+-------------+----------+--------------+ Left Basilic     Diameter (cm)Depth (cm)   Findings    +-----------------+-------------+----------+--------------+ Shoulder             0.66        1.17                  +-----------------+-------------+----------+--------------+ Prox upper arm       0.51        1.27                  +-----------------+-------------+----------+--------------+ Mid upper arm        0.42        1.10                  +-----------------+-------------+----------+--------------+ Dist upper arm       0.37        1.02     branching    +-----------------+-------------+----------+--------------+ Antecubital fossa    0.11        0.36                  +-----------------+-------------+----------+--------------+ Prox forearm                            not visualized +-----------------+-------------+----------+--------------+  Mid forearm                             not visualized +-----------------+-------------+----------+--------------+ Distal forearm                          not visualized  +-----------------+-------------+----------+--------------+ Elbow                                   not visualized +-----------------+-------------+----------+--------------+ Wrist                                   not visualized +-----------------+-------------+----------+--------------+ *See table(s) above for measurements and observations.  Diagnosing physician: Deitra Mayo MD Electronically signed by Deitra Mayo MD on 02/02/2019 at 5:39:49 PM.    Final    . amiodarone  200 mg Oral Daily  . carvedilol  12.5 mg Oral BID  . Chlorhexidine Gluconate Cloth  6 each Topical Q0600  . ferrous sulfate  325 mg Oral Q breakfast  . levothyroxine  75 mcg Oral QAC breakfast    BMET    Component Value Date/Time   NA 137 02/04/2019 0453   NA 138 12/25/2018 1134   K 4.2 02/04/2019 0453   CL 99 02/04/2019 0453   CO2 23 02/04/2019 0453   GLUCOSE 101 (H) 02/04/2019 0453   BUN 64 (H) 02/04/2019 0453   BUN 99 (HH) 12/25/2018 1134   CREATININE 2.91 (H) 02/04/2019 0453   CALCIUM 8.1 (L) 02/04/2019 0453   GFRNONAA 15 (L) 02/04/2019 0453   GFRAA 17 (L) 02/04/2019 0453   CBC    Component Value Date/Time   WBC 4.9 02/04/2019 0453   RBC 3.27 (L) 02/04/2019 0453   HGB 10.9 (L) 02/04/2019 0453   HGB 9.9 (L) 12/25/2018 1134   HCT 35.9 (L) 02/04/2019 0453   HCT 29.6 (L) 12/25/2018 1134   PLT 177 02/04/2019 0453   PLT 151 12/25/2018 1134   MCV 109.8 (H) 02/04/2019 0453   MCV 103 (H) 12/25/2018 1134   MCH 33.3 02/04/2019 0453   MCHC 30.4 02/04/2019 0453   RDW 16.0 (H) 02/04/2019 0453   RDW 15.2 12/25/2018 1134   LYMPHSABS 0.4 (L) 01/31/2019 0441   LYMPHSABS 0.3 (L) 12/25/2018 1134   MONOABS 0.7 01/31/2019 0441   EOSABS 0.2 01/31/2019 0441   EOSABS 0.0 12/25/2018 1134   BASOSABS 0.0 01/31/2019 0441   BASOSABS 0.0 12/25/2018 1134    Assessment/Plan: 1. AKI/CKD stage 5 vs progressive CKD due to cardiorenal syndrome- She hasnow progressed to ESRD and is tolerating  HD.failed attempt to convert to biventricular pacer on 12/29/18 due to an occluded left subclavian vein).tolerated first HD 02/01/19 and had second 02/03/19. Awaiting outpatient HD facility and schedule.HD tomorrow.   2. Acute on chronic CHF-Heart failure team following. On coreg. 1. UF with HD as tolerated 2. Continue to follow strict I's/O's and daily weights.  3. Anemia of CKD stage 5- will check iron stores and follow. 4. A fib with AV nodal ablation- consult cardiology and likely EP. She had failed attempt to convert to biventricular pacer on 12/29/18 due to an occluded left subclavian vein.  1. Will need functional AVF/AVG before attempting LV lead placement per Cards. 5. Hypothyroidism- cont with replacement therapy 6. OSA- on CPAP 7. PVC's- per Cardiology  8. Vascular access- s/p RIJ TDC placement 02/02/19 by IR. s/p first stage, right BVT creation 02/04/19 by Dr. Donzetta Matters.   9. Disposition- pt is severely debilitated and requires max assistance to stand. Not appropriate for discharge to home, however she does not want to go to rehab given past experiences and issues with her husband who has dementia.  Consider CIR for 1-2 weeks until she is strong enough to move on her own.   Donetta Potts, MD [employer] [phone]

## 2019-02-05 ENCOUNTER — Encounter (HOSPITAL_COMMUNITY): Payer: Self-pay | Admitting: Vascular Surgery

## 2019-02-05 LAB — CBC
HCT: 32.3 % — ABNORMAL LOW (ref 36.0–46.0)
Hemoglobin: 10.2 g/dL — ABNORMAL LOW (ref 12.0–15.0)
MCH: 33.3 pg (ref 26.0–34.0)
MCHC: 31.6 g/dL (ref 30.0–36.0)
MCV: 105.6 fL — ABNORMAL HIGH (ref 80.0–100.0)
Platelets: 177 10*3/uL (ref 150–400)
RBC: 3.06 MIL/uL — ABNORMAL LOW (ref 3.87–5.11)
RDW: 15.9 % — ABNORMAL HIGH (ref 11.5–15.5)
WBC: 4.6 10*3/uL (ref 4.0–10.5)
nRBC: 0 % (ref 0.0–0.2)

## 2019-02-05 LAB — HEPARIN LEVEL (UNFRACTIONATED): Heparin Unfractionated: 1.05 IU/mL — ABNORMAL HIGH (ref 0.30–0.70)

## 2019-02-05 LAB — SARS CORONAVIRUS 2 (TAT 6-24 HRS): SARS Coronavirus 2: NEGATIVE

## 2019-02-05 LAB — RENAL FUNCTION PANEL
Albumin: 2.8 g/dL — ABNORMAL LOW (ref 3.5–5.0)
Anion gap: 14 (ref 5–15)
BUN: 78 mg/dL — ABNORMAL HIGH (ref 8–23)
CO2: 22 mmol/L (ref 22–32)
Calcium: 8.4 mg/dL — ABNORMAL LOW (ref 8.9–10.3)
Chloride: 97 mmol/L — ABNORMAL LOW (ref 98–111)
Creatinine, Ser: 3.29 mg/dL — ABNORMAL HIGH (ref 0.44–1.00)
GFR calc Af Amer: 15 mL/min — ABNORMAL LOW (ref >60)
GFR calc non Af Amer: 13 mL/min — ABNORMAL LOW (ref >60)
Glucose, Bld: 88 mg/dL (ref 70–99)
Phosphorus: 4.9 mg/dL — ABNORMAL HIGH (ref 2.5–4.6)
Potassium: 4.6 mmol/L (ref 3.5–5.1)
Sodium: 133 mmol/L — ABNORMAL LOW (ref 135–145)

## 2019-02-05 LAB — APTT: aPTT: 52 seconds — ABNORMAL HIGH (ref 24–36)

## 2019-02-05 LAB — MAGNESIUM: Magnesium: 2.2 mg/dL (ref 1.7–2.4)

## 2019-02-05 MED ORDER — APIXABAN 5 MG PO TABS
5.0000 mg | ORAL_TABLET | Freq: Two times a day (BID) | ORAL | Status: DC
  Administered 2019-02-05 – 2019-02-06 (×3): 5 mg via ORAL
  Filled 2019-02-05 (×3): qty 1

## 2019-02-05 MED ORDER — RENA-VITE PO TABS
1.0000 | ORAL_TABLET | Freq: Every day | ORAL | Status: DC
  Administered 2019-02-05: 1 via ORAL
  Filled 2019-02-05: qty 1

## 2019-02-05 MED ORDER — ORAL CARE MOUTH RINSE
15.0000 mL | Freq: Two times a day (BID) | OROMUCOSAL | Status: DC
  Administered 2019-02-06: 15 mL via OROMUCOSAL

## 2019-02-05 MED ORDER — CHLORHEXIDINE GLUCONATE 0.12 % MT SOLN
15.0000 mL | Freq: Two times a day (BID) | OROMUCOSAL | Status: DC
  Administered 2019-02-05 – 2019-02-06 (×3): 15 mL via OROMUCOSAL
  Filled 2019-02-05 (×4): qty 15

## 2019-02-05 MED ORDER — HEPARIN SODIUM (PORCINE) 1000 UNIT/ML IJ SOLN
INTRAMUSCULAR | Status: AC
  Filled 2019-02-05: qty 4

## 2019-02-05 MED ORDER — HEPARIN SODIUM (PORCINE) 1000 UNIT/ML DIALYSIS: 20.0000 [IU]/kg | INTRAMUSCULAR | Status: DC | PRN

## 2019-02-05 NOTE — Plan of Care (Signed)

## 2019-02-05 NOTE — Progress Notes (Signed)
ANTICOAGULATION CONSULT NOTE - Follow Up Consult  Pharmacy Consult for Heparin (Eliquis on hold) Indication: atrial fibrillation  Allergies  Allergen Reactions  . Bactrim [Sulfamethoxazole-Trimethoprim] Other (See Comments)    Flu like symptoms  . Cephalexin Rash    REACTION: Rash to arms/legs    Patient Measurements: Height: 5\' 4"  (162.6 cm) Weight: 212 lb 1.3 oz (96.2 kg) IBW/kg (Calculated) : 54.7 Heparin Dosing Weight: 79 kg  Vital Signs: Temp: 98.2 F (36.8 C) (12/04 0453) Temp Source: Oral (12/04 0453) BP: 96/58 (12/04 0453) Pulse Rate: 78 (12/04 0453)  Labs: Recent Labs    02/03/19 0419 02/03/19 1120 02/03/19 1610 02/04/19 0453 02/04/19 1952 02/05/19 0628 02/05/19 0734 02/05/19 0736  HGB 10.4*  --  10.4* 10.9*  --  10.2*  --   --   HCT 33.6*  --  33.4* 35.9*  --  32.3*  --   --   PLT 187  --  189 177  --  177  --   --   APTT 57*  --  73* 75* 53*  --  52*  --   HEPARINUNFRC >2.20*  --   --  1.74*  --   --   --  1.05*  CREATININE 3.04* 1.93*  --  2.91*  --  3.29*  --   --    Assessment:  53 yoF admitted with progressive CHF and CKD. Pt is on Eliquis PTA for hx PAF which was resumed 11/30 after holding for temporary HD access. VVS consulted for permanent access, planned for 12/3 am. Pharmacy asked to start heparin bridge on 12/1. Last dose of Eliquis was 12/1 ~0900.  Heparin level remains falsely elevated this am due to recent Eliquis doses, so following aPTT.  Aptt this AM low at 52, however IV infiltrated, and heparin gtt currently off.  Goal of Therapy:  Heparin level 0.3-0.7 units/ml aPTT 66-102 seconds Monitor platelets by anticoagulation protocol: Yes   Plan:  Discussed with Dr. Eliseo Squires, no further procedures planned this admission, difficulty with IV access, will stop heparin and resume Eliquis. Restart Eliquis 5 mg BID.  Marguerite Olea, [facility] Clinical Pharmacist Phone [phone]  02/05/2019 8:57 AM

## 2019-02-05 NOTE — Progress Notes (Addendum)
PROGRESS NOTE    Gina [name]  [id number] DOB: [birth year] DOA: 01/27/2019 PCP: Glenda Chroman, MD   Brief Narrative: 78 year old with past medical history significant for chronic systolic heart failure due to nonischemic cardiomyopathy with ejection fraction of 20%, left bundle branch block, a stage V chronic kidney disease, A. fib status post AV nodal ablation s/p  [facility] PPM in 2019 on Eliquis, anemia of chronic disease, hypertension, renal artery stenosis, obstructive sleep apnea on CPAP who presents to the emergency department complaining of worsening shortness of breath for 3 weeks prior to admission.  Patient was evaluated at the heart failure clinic the morning of admission and was referred to the ED for admission.  Patient reported weight gain, shortness of breath on exertion.  She has been compliant with her diuretics.   Patient was admitted for acute on chronic systolic heart failure exacerbation.  She was a started on IV Lexis.  Nephrology was consulted as the patient is nearing initiation of dialysis.  Patient underwent tunnel catheter placement on 02/01/2019, with the plan of initiation hemodialysis.  Plan is for removal by cardiology of atrial -lead for PPM with placement of LV lead after fistula is mature and in a stable use. Patient has not been out of bed entire hospitalization.  Once clipped, will d/c home with family and home health.  Declined SNF.  Assessment & Plan:   Principal Problem:   Acute on chronic congestive heart failure (HCC) Active Problems:   Hypertension   Mitral regurgitation   Anemia of chronic disease   OSA (obstructive sleep apnea)   Atrial fibrillation with RVR (HCC)   Pacemaker   Hypothyroidism   CKD (chronic kidney disease) stage 5, GFR less than 15 ml/min (HCC)   PVC (premature ventricular contraction)   Pressure injury of skin   Acute on chronic systolic congestive heart failure exacerbation: -Cardiology and nephrology has been  helping with patient care during this admission. -Patient was treated with IV lasix 100 mg IV BID. Lasix has been discontinue, now that patient will start HD -An attempt was made by cardiology on 12/2018 to place an LV lead in the patient with removal of lateral leads to improve her output.  This was unsuccessful due to upper extremity thrombosis.  The plan now is for the patient to return for this procedure after her AV fistula is mature and  stable -started on HD 11/30  Stage V chronic kidney disease, progression of end-stage renal disease: -Nephrology was consulted -Underwent tunnel catheter placement on 11/30. -HD started 11/30 -s/p vascular placement on 12/3  Hyperkalemia: Resolved  left knee pain: Knee erythematosus swollen.  Patient was evaluated by orthopedic who is recommending local Aspercreme.  Weightbearing as tolerated.  Follow-up with Dr. Benjaman Kindler in 2 to 4 weeks.  A. fib with RVR:     Continue with carvedilol. -amiodarone -resume eliquis  Anemia of chronic disease: With mild iron deficiency anemia: Monitor hemoglobin.  Hypothyroidism: Continue with levothyroxine  Obstructive  sleep apnea:  -Continue with CPAP   Pressure Injury 01/27/19 Buttocks Right;Left Stage I -  Intact skin with non-blanchable redness of a localized area usually over a bony prominence. (Active)  01/27/19 1700  Location: Buttocks  Location Orientation: Right;Left  Staging: Stage I -  Intact skin with non-blanchable redness of a localized area usually over a bony prominence.  Wound Description (Comments):   Present on Admission: Yes   Deconditioning -PT eval- ? Need for SNF vs CIR  obesity Estimated body  mass index is 36.4 kg/m as calculated from the following:   Height as of this encounter: 5\' 4"  (1.626 m).   Weight as of this encounter: 96.2 kg.   DVT prophylaxis: heparin Code Status: Full code Family Communication: patient and family at bedside 12/3 Disposition Plan: patient does not  want to go to SNF- would prefer to go home with family support and amedysis  -await CLIPPING -repeat COVID test per nursing  Consultants:   Cardiology  Nephrology  vascular  Procedures:   Hemodialysis tunnel catheter placement  basilic vein fistula    Subjective: Feels like she is progressing with PT and does not want to go to SNF  Objective: Vitals:   02/04/19 1159 02/04/19 1934 02/05/19 0453 02/05/19 0453  BP: 106/69 (!) 102/57  (!) 96/58  Pulse: 72 70  78  Resp: 20 18  20   Temp: 97.8 F (36.6 C) 98.1 F (36.7 C)  98.2 F (36.8 C)  TempSrc:  Oral  Oral  SpO2: 100% 97%  100%  Weight:   96.2 kg   Height:        Intake/Output Summary (Last 24 hours) at 02/05/2019 1057 Last data filed at 02/05/2019 0844 Gross per 24 hour  Intake 881.22 ml  Output 200 ml  Net 681.22 ml   Filed Weights   02/03/19 0948 02/04/19 0325 02/05/19 0453  Weight: 95.8 kg 95.9 kg 96.2 kg    Examination:  In bed, NAD No increased work of breathing Obese +BS, soft Skin with areas of bruising,    Data Reviewed: I have personally reviewed following labs and imaging studies  CBC: Recent Labs  Lab 01/31/19 0441 02/01/19 0505 02/03/19 0419 02/03/19 1610 02/04/19 0453 02/05/19 0628  WBC 5.8 7.0 4.6 5.9 4.9 4.6  NEUTROABS 4.5  --   --   --   --   --   HGB 10.2* 10.4* 10.4* 10.4* 10.9* 10.2*  HCT 32.7* 32.9* 33.6* 33.4* 35.9* 32.3*  MCV 108.3* 106.5* 107.3* 106.4* 109.8* 105.6*  PLT 184 193 187 189 177 [id number]   Basic Metabolic Panel: Recent Labs  Lab 02/01/19 0505 02/02/19 0548 02/03/19 0419 02/03/19 1120 02/04/19 0453 02/05/19 0628  NA 138 138 137 140 137 133*  K 4.0 3.9 3.5 3.8 4.2 4.6  CL 96* 97* 95* 99 99 97*  CO2 27 30 27 29 23 22   GLUCOSE 95 102* 94 119* 101* 88  BUN 112* 75* 84* 42* 64* 78*  CREATININE 3.48* 2.82* 3.04* 1.93* 2.91* 3.29*  CALCIUM 8.1* 8.4* 8.4* 8.2* 8.1* 8.4*  MG 2.2 2.2 2.2  --  2.1 2.2  PHOS 4.5 4.3 4.8* 2.9 3.8 4.9*   GFR: Estimated  Creatinine Clearance: 15.9 mL/min (A) (by C-G formula based on SCr of 3.29 mg/dL (H)). Liver Function Tests: Recent Labs  Lab 02/02/19 0548 02/03/19 0419 02/03/19 1120 02/04/19 0453 02/05/19 0628  ALBUMIN 2.9* 2.7* 3.0* 2.7* 2.8*   No results for input(s): LIPASE, AMYLASE in the last 168 hours. No results for input(s): AMMONIA in the last 168 hours. Coagulation Profile: Recent Labs  Lab 02/01/19 0505  INR 1.4*   Cardiac Enzymes: No results for input(s): CKTOTAL, CKMB, CKMBINDEX, TROPONINI in the last 168 hours. BNP (last 3 results) No results for input(s): PROBNP in the last 8760 hours. HbA1C: No results for input(s): HGBA1C in the last 72 hours. CBG: No results for input(s): GLUCAP in the last 168 hours. Lipid Profile: No results for input(s): CHOL, HDL, LDLCALC, TRIG, CHOLHDL, LDLDIRECT in  the last 72 hours. Thyroid Function Tests: No results for input(s): TSH, T4TOTAL, FREET4, T3FREE, THYROIDAB in the last 72 hours. Anemia Panel: No results for input(s): VITAMINB12, FOLATE, FERRITIN, TIBC, IRON, RETICCTPCT in the last 72 hours. Sepsis Labs: No results for input(s): PROCALCITON, LATICACIDVEN in the last 168 hours.  Recent Results (from the past 240 hour(s))  SARS CORONAVIRUS 2 (TAT 6-24 HRS) Nasopharyngeal Nasopharyngeal Swab     Status: None   Collection Time: 01/27/19  2:53 PM   Specimen: Nasopharyngeal Swab  Result Value Ref Range Status   SARS Coronavirus 2 NEGATIVE NEGATIVE Final    Comment: (NOTE) SARS-CoV-2 target nucleic acids are NOT DETECTED. The SARS-CoV-2 RNA is generally detectable in upper and lower respiratory specimens during the acute phase of infection. Negative results do not preclude SARS-CoV-2 infection, do not rule out co-infections with other pathogens, and should not be used as the sole basis for treatment or other patient management decisions. Negative results must be combined with clinical observations, patient history, and epidemiological  information. The expected result is Negative. Fact Sheet for Patients: SugarRoll.be Fact Sheet for Healthcare Providers: https://www.woods-mathews.com/ This test is not yet approved or cleared by the [place] FDA and  has been authorized for detection and/or diagnosis of SARS-CoV-2 by FDA under an Emergency Use Authorization (EUA). This EUA will remain  in effect (meaning this test can be used) for the duration of the COVID-19 declaration under Section 56 4(b)(1) of the Act, 21 U.S.C. section 360bbb-3(b)(1), unless the authorization is terminated or revoked sooner. Performed at [facility], [place] [address] [place], [place] [address]          Radiology Studies: No results found.      Scheduled Meds: . amiodarone  200 mg Oral Daily  . apixaban  5 mg Oral BID  . carvedilol  12.5 mg Oral BID  . chlorhexidine  15 mL Mouth Rinse BID  . Chlorhexidine Gluconate Cloth  6 each Topical Q0600  . ferrous sulfate  325 mg Oral Q breakfast  . levothyroxine  75 mcg Oral QAC breakfast  . mouth rinse  15 mL Mouth Rinse q12n4p  . multivitamin  1 tablet Oral QHS   Continuous Infusions:    LOS: 9 days    Time spent: 25 minutes.     Geradine Girt, DO [facility]   If 7PM-7AM, please contact night-coverage [email or web address] Password [email or web address] 02/05/2019, 10:57 AM

## 2019-02-05 NOTE — Progress Notes (Signed)
Patient ID: Gina [name], female   DOB: [birth year], 78 y.o.   MRN: [id number]  [facility] Progress Note   Assessment/ Plan:   1. Acute exacerbation of CHF: Volume status improving with hemodialysis/ultrafiltration.  She remains deconditioned and is ongoing management of this position needs challenging. 2. ESRD: Continue hemodialysis on Monday/Wednesday/Friday schedule via right IJ TDC.  She has now undergone surgery for first stage right brachiobasilic fistula.  Process underway for outpatient dialysis unit placement. 3. Anemia: Hemoglobin acceptable, iron stores replete.  No overt loss. 4. CKD-MBD: Calcium and phosphorus level within acceptable range, not on binders. 5. Nutrition: Continue renal diet with nutritional supplementation.  Begin renal multivitamin. 6. Hypertension: Blood pressure within acceptable range, will continue to monitor with hemodialysis/ultrafiltration.  Subjective:   Reports to be feeling fair this morning, denies any chest pain or shortness of breath.  No pain over fistula site.   Objective:   BP (!) 96/58 (BP Location: Left Arm)   Pulse 78   Temp 98.2 F (36.8 C) (Oral)   Resp 20   Ht 5\' 4"  (1.626 m)   Wt 96.2 kg   SpO2 100%   BMI 36.40 kg/m   Physical Exam: Gen: Comfortably sitting up in bed, nurse at bedside CVS: Pulse regular rhythm, normal rate, S1 and S2 Resp: Diminished breath sounds over bases, no distinct rales or rhonchi Abd: Soft, obese, nontender Ext: Trace ankle edema, right 1st stage BBF with audible bruit  Labs: BMET Recent Labs  Lab 01/31/19 0441 01/31/19 1340 02/01/19 0505 02/02/19 0548 02/03/19 0419 02/03/19 1120 02/04/19 0453 02/05/19 0628  NA 141  --  138 138 137 140 137 133*  K 3.2* 4.2 4.0 3.9 3.5 3.8 4.2 4.6  CL 95*  --  96* 97* 95* 99 99 97*  CO2 32  --  27 30 27 29 23 22   GLUCOSE 91  --  95 102* 94 119* 101* 88  BUN 113*  --  112* 75* 84* 42* 64* 78*  CREATININE 3.56*  --  3.48* 2.82* 3.04* 1.93*  2.91* 3.29*  CALCIUM 8.4*  --  8.1* 8.4* 8.4* 8.2* 8.1* 8.4*  PHOS 4.2  --  4.5 4.3 4.8* 2.9 3.8 4.9*   CBC Recent Labs  Lab 01/31/19 0441  02/03/19 0419 02/03/19 1610 02/04/19 0453 02/05/19 0628  WBC 5.8   < > 4.6 5.9 4.9 4.6  NEUTROABS 4.5  --   --   --   --   --   HGB 10.2*   < > 10.4* 10.4* 10.9* 10.2*  HCT 32.7*   < > 33.6* 33.4* 35.9* 32.3*  MCV 108.3*   < > 107.3* 106.4* 109.8* 105.6*  PLT 184   < > 187 189 177 177   < > = values in this interval not displayed.   Medications:    . amiodarone  200 mg Oral Daily  . apixaban  5 mg Oral BID  . carvedilol  12.5 mg Oral BID  . chlorhexidine  15 mL Mouth Rinse BID  . Chlorhexidine Gluconate Cloth  6 each Topical Q0600  . ferrous sulfate  325 mg Oral Q breakfast  . levothyroxine  75 mcg Oral QAC breakfast  . mouth rinse  15 mL Mouth Rinse q12n4p   Elmarie Shiley, MD 02/05/2019, 9:05 AM

## 2019-02-05 NOTE — Progress Notes (Signed)
[facility] Admissions coordinator has reached out to OP HD clinic/[place] again today regarding acceptance for patient--still pending. Renal Navigator to continue to follow.  Alphonzo Cruise, [place] Renal Navigator [phone]

## 2019-02-05 NOTE — Plan of Care (Signed)
  Problem: Education: Goal: Knowledge of General Education information will improve Description: Including pain rating scale, medication(s)/side effects and non-pharmacologic comfort measures 02/05/2019 1824 by Rolm Baptise, RN Outcome: Progressing 02/05/2019 1823 by Rolm Baptise, RN Outcome: Progressing   Problem: Education: Goal: Ability to demonstrate management of disease process will improve 02/05/2019 1824 by Rolm Baptise, RN Outcome: Progressing 02/05/2019 1823 by Rolm Baptise, RN Outcome: Progressing Goal: Ability to verbalize understanding of medication therapies will improve 02/05/2019 1824 by Rolm Baptise, RN Outcome: Progressing 02/05/2019 1823 by Rolm Baptise, RN Outcome: Progressing Goal: Individualized Educational Video(s) 02/05/2019 1824 by Rolm Baptise, RN Outcome: Progressing 02/05/2019 1823 by Rolm Baptise, RN Outcome: Progressing   Problem: Activity: Goal: Capacity to carry out activities will improve 02/05/2019 1824 by Rolm Baptise, RN Outcome: Progressing 02/05/2019 1823 by Rolm Baptise, RN Outcome: Progressing Refused to get out of be today   Problem: Cardiac: Goal: Ability to achieve and maintain adequate cardiopulmonary perfusion will improve 02/05/2019 1824 by Rolm Baptise, RN Outcome: Progressing 02/05/2019 1823 by Rolm Baptise, RN Outcome: Progressing  A-Paced Continuous monitoring

## 2019-02-05 NOTE — Progress Notes (Signed)
VAST consulted to start PIV for heparin infusion. Spoke with Melva, RN (pt's nurse) regarding need for heparin vs switching pt to PO medication d/t her difficult vasculature (right arm restricted, left hand extremely swollen).  While Melva contacted physician, assessed left lower arm with [place]. Melva returned and stated heparin will be stopped today and if she needed it in the meantime she could get it with her dialysis tx today. To allow for vein preservation and decrease risk of infection, PIV access will not be obtained at this time. Melva will put in another consult if IV access needed.

## 2019-02-05 NOTE — Progress Notes (Signed)
Covid test complete and sent lab waiting on Dialysis Treatment Plan to discharge tomorrow per nephrologist discharge disposition orders

## 2019-02-05 NOTE — Progress Notes (Signed)
Patient stated that she walked yesterday

## 2019-02-05 NOTE — Progress Notes (Signed)
Report given to HD Waiting on transport

## 2019-02-05 NOTE — Progress Notes (Signed)
  Progress Note    02/05/2019 8:28 AM 1 Day Post-Op  Subjective: No overnight issues  Vitals:   02/04/19 1934 02/05/19 0453  BP: (!) 102/57 (!) 96/58  Pulse: 70 78  Resp: 18 20  Temp: 98.1 F (36.7 C) 98.2 F (36.8 C)  SpO2: 97% 100%    Physical Exam: Awake alert oriented Palpable right basilic vein fistula with thrill Right hand is warm and well-perfused  CBC    Component Value Date/Time   WBC 4.6 02/05/2019 0628   RBC 3.06 (L) 02/05/2019 0628   HGB 10.2 (L) 02/05/2019 0628   HGB 9.9 (L) 12/25/2018 1134   HCT 32.3 (L) 02/05/2019 0628   HCT 29.6 (L) 12/25/2018 1134   PLT 177 02/05/2019 0628   PLT 151 12/25/2018 1134   MCV 105.6 (H) 02/05/2019 0628   MCV 103 (H) 12/25/2018 1134   MCH 33.3 02/05/2019 0628   MCHC 31.6 02/05/2019 0628   RDW 15.9 (H) 02/05/2019 0628   RDW 15.2 12/25/2018 1134   LYMPHSABS 0.4 (L) 01/31/2019 0441   LYMPHSABS 0.3 (L) 12/25/2018 1134   MONOABS 0.7 01/31/2019 0441   EOSABS 0.2 01/31/2019 0441   EOSABS 0.0 12/25/2018 1134   BASOSABS 0.0 01/31/2019 0441   BASOSABS 0.0 12/25/2018 1134    BMET    Component Value Date/Time   NA 133 (L) 02/05/2019 0628   NA 138 12/25/2018 1134   K 4.6 02/05/2019 0628   CL 97 (L) 02/05/2019 0628   CO2 22 02/05/2019 0628   GLUCOSE 88 02/05/2019 0628   BUN 78 (H) 02/05/2019 0628   BUN 99 (HH) 12/25/2018 1134   CREATININE 3.29 (H) 02/05/2019 0628   CALCIUM 8.4 (L) 02/05/2019 0628   GFRNONAA 13 (L) 02/05/2019 0628   GFRAA 15 (L) 02/05/2019 0628    INR    Component Value Date/Time   INR 1.4 (H) 02/01/2019 0505     Intake/Output Summary (Last 24 hours) at 02/05/2019 0828 Last data filed at 02/05/2019 0454 Gross per 24 hour  Intake 641.22 ml  Output 200 ml  Net 441.22 ml     Assessment/plan:  78 y.o. female is s/p creation right arm for stage basilic vein fistula.  She will need a second stage in the future.  We will have her follow-up in 4 to 6 weeks with dialysis duplex.  She will continue  to use her tunneled catheter until then.    Firmin Belisle C. Donzetta Matters, MD [facility] Office: [phone] Pager: [phone]  02/05/2019 8:28 AM

## 2019-02-05 NOTE — Progress Notes (Signed)
Per renal navigator: OP HD seat for her at an HD clinic in [place] and she can start on Tuesday.

## 2019-02-06 DIAGNOSIS — E6609 Other obesity due to excess calories: Secondary | ICD-10-CM

## 2019-02-06 DIAGNOSIS — Z6835 Body mass index (BMI) 35.0-35.9, adult: Secondary | ICD-10-CM

## 2019-02-06 LAB — RENAL FUNCTION PANEL
Albumin: 2.7 g/dL — ABNORMAL LOW (ref 3.5–5.0)
Anion gap: 11 (ref 5–15)
BUN: 44 mg/dL — ABNORMAL HIGH (ref 8–23)
CO2: 27 mmol/L (ref 22–32)
Calcium: 8.6 mg/dL — ABNORMAL LOW (ref 8.9–10.3)
Chloride: 99 mmol/L (ref 98–111)
Creatinine, Ser: 2.26 mg/dL — ABNORMAL HIGH (ref 0.44–1.00)
GFR calc Af Amer: 23 mL/min — ABNORMAL LOW (ref >60)
GFR calc non Af Amer: 20 mL/min — ABNORMAL LOW (ref >60)
Glucose, Bld: 92 mg/dL (ref 70–99)
Phosphorus: 3.7 mg/dL (ref 2.5–4.6)
Potassium: 4.2 mmol/L (ref 3.5–5.1)
Sodium: 137 mmol/L (ref 135–145)

## 2019-02-06 LAB — CBC
HCT: 32.5 % — ABNORMAL LOW (ref 36.0–46.0)
Hemoglobin: 10.3 g/dL — ABNORMAL LOW (ref 12.0–15.0)
MCH: 33.6 pg (ref 26.0–34.0)
MCHC: 31.7 g/dL (ref 30.0–36.0)
MCV: 105.9 fL — ABNORMAL HIGH (ref 80.0–100.0)
Platelets: 195 10*3/uL (ref 150–400)
RBC: 3.07 MIL/uL — ABNORMAL LOW (ref 3.87–5.11)
RDW: 15.4 % (ref 11.5–15.5)
WBC: 5.3 10*3/uL (ref 4.0–10.5)
nRBC: 0 % (ref 0.0–0.2)

## 2019-02-06 LAB — MAGNESIUM: Magnesium: 2.1 mg/dL (ref 1.7–2.4)

## 2019-02-06 MED ORDER — RENA-VITE PO TABS: 1.0000 | ORAL_TABLET | Freq: Every day | ORAL | 0 refills | Status: DC

## 2019-02-06 NOTE — Progress Notes (Signed)
Case management is working on patient's home health arrangements now.

## 2019-02-06 NOTE — Progress Notes (Signed)
Patient ID: Gina [name], female   DOB: 1940/06/12, 78 y.o.   MRN: [id number]  [facility] Progress Note   Assessment/ Plan:   1. Acute exacerbation of CHF: Volume status improving with hemodialysis/ultrafiltration.  Will need to continue challenging dry weight as an outpatient at hemodialysis. 2. ESRD: She was on Monday/Wednesday/Friday schedule via right IJ [facility] while in the hospital and can tolerate her next hemodialysis treatment on Tuesday, 02/09/2019 as an outpatient in [place], [place].  She has now undergone surgery for first stage right brachiobasilic fistula.  Discussed fluid restriction. 3. Anemia: Hemoglobin acceptable, iron stores replete.  No overt loss. 4. CKD-MBD: Calcium and phosphorus level within acceptable range, not on binders. 5. Nutrition: Continue renal diet with nutritional supplementation.  Begin renal multivitamin. 6. Hypertension: Blood pressure within acceptable range, will continue to monitor with hemodialysis/ultrafiltration.  Okay from renal standpoint to discharge home today with outpatient hemodialysis on Tuesday, 02/09/2019.  Subjective:   She reports that she continues to feel better and is pleased at the prospect of being able to go home.   Objective:   BP (!) 109/57 (BP Location: Left Arm)   Pulse 73   Temp 99 F (37.2 C) (Oral) Comment: pt had just brushed her teeth  Resp 18   Ht 5\' 4"  (1.626 m)   Wt 94.1 kg   SpO2 96%   BMI 35.61 kg/m   Physical Exam: Gen: Comfortably sitting up in bed watching television CVS: Pulse regular rhythm, normal rate, S1 and S2 Resp: Clear to auscultation bilaterally without distinct rales or rhonchi Abd: Soft, obese, nontender Ext: Trace ankle edema, right 1st stage BBF with audible bruit  Labs: BMET Recent Labs  Lab 02/01/19 0505 02/02/19 0548 02/03/19 0419 02/03/19 1120 02/04/19 0453 02/05/19 0628 02/06/19 0351  NA 138 138 137 140 137 133* 137  K 4.0 3.9 3.5 3.8 4.2 4.6 4.2  CL 96* 97*  95* 99 99 97* 99  CO2 27 30 27 29 23 22 27   GLUCOSE 95 102* 94 119* 101* 88 92  BUN 112* 75* 84* 42* 64* 78* 44*  CREATININE 3.48* 2.82* 3.04* 1.93* 2.91* 3.29* 2.26*  CALCIUM 8.1* 8.4* 8.4* 8.2* 8.1* 8.4* 8.6*  PHOS 4.5 4.3 4.8* 2.9 3.8 4.9* 3.7   CBC Recent Labs  Lab 01/31/19 0441  02/03/19 1610 02/04/19 0453 02/05/19 0628 02/06/19 0351  WBC 5.8   < > 5.9 4.9 4.6 5.3  NEUTROABS 4.5  --   --   --   --   --   HGB 10.2*   < > 10.4* 10.9* 10.2* 10.3*  HCT 32.7*   < > 33.4* 35.9* 32.3* 32.5*  MCV 108.3*   < > 106.4* 109.8* 105.6* 105.9*  PLT 184   < > 189 177 177 195   < > = values in this interval not displayed.   Medications:    . amiodarone  200 mg Oral Daily  . apixaban  5 mg Oral BID  . carvedilol  12.5 mg Oral BID  . chlorhexidine  15 mL Mouth Rinse BID  . Chlorhexidine Gluconate Cloth  6 each Topical Q0600  . ferrous sulfate  325 mg Oral Q breakfast  . levothyroxine  75 mcg Oral QAC breakfast  . mouth rinse  15 mL Mouth Rinse q12n4p  . multivitamin  1 tablet Oral QHS   Elmarie Shiley, MD 02/06/2019, 9:12 AM

## 2019-02-06 NOTE — Progress Notes (Signed)
Patient resting comfortably during shift report. Denies complaints.  

## 2019-02-06 NOTE — TOC Transition Note (Addendum)
Transition of Care [facility]) - CM/SW Discharge Note   Patient Details  Name: Gina [name] MRN: [id number] Date of Birth: May 25, 1940  Transition of Care [facility]) CM/SW Contact:  Carles Collet, RN Phone Number: 02/06/2019, 12:16 PM   Clinical Narrative:    12:15 Spoke w patient at bedside. She will return to address on facesheet.  She is active w [facility], referral accepted by [facility]. No DME needs. Patient states she has home O2 only through her CPAP at night and she declined my offer for transport tank through [place], she states she is only wearing O2 now because she was wanting it for comfort.  She has a ride on the way No other CM needs.  14:25 Notified [place] nurse that patient continues to decline SNF and will need PTAR. Per Google address is 43 miles away from hospital. PTAR called for transport.  14:45 Spoke w patient and family at bedside. Patient has WC RW 3/1 at home. Provided nurse with PTAR forms.     Final next level of care: [place] Barriers to Discharge: No Barriers Identified   Patient Goals and CMS Choice Patient states their goals for this hospitalization and ongoing recovery are:: to go home CMS Medicare.gov Compare Post Acute Care list provided to:: Patient    Discharge Placement                       Discharge Plan and Services                          HH Arranged: PT, OT [facility] Agency: [place] Date [facility] Agency Contacted: 02/06/19 Time [place]: 1214 Representative spoke with at [place]: [place] ([place]) Interventions     Readmission Risk Interventions Readmission Risk Prevention Plan 02/02/2019  Transportation Screening Complete  PCP or Specialist Appt within 3-5 Days Complete  HRI or [place] Complete  Social Work Consult for [place] Planning/Counseling Complete  Palliative Care Screening Not Applicable  Medication Review Press photographer) Complete   Some recent data might be hidden

## 2019-02-06 NOTE — Progress Notes (Signed)
Call placed to CCMD to notify of telemetry monitoring d/c.   

## 2019-02-06 NOTE — Discharge Summary (Addendum)
Physician Discharge Summary  Gina [name] [id number] DOB: December 19, 1940 DOA: 01/27/2019  PCP: Glenda Chroman, MD  Admit date: 01/27/2019 Discharge date: 02/06/2019  Admitted From: home Discharge disposition: home   Recommendations for Outpatient Follow-Up:   1. Patient refused SNF so have arranged home health and patient has arranged care givers 2. HD to begin Tuesday in [place], [place].  3. plan now is for the patient to return to cardiology for placement of a LV lead  with removal of lateral leads to improve her output after her AV fistula is mature and  stable  Follow-up with Dr. Benjaman Kindler in 2 to 4 weeks if knee pain persists  Discharge Diagnosis:   Principal Problem:   Acute on chronic congestive heart failure (HCC) Active Problems:   Hypertension   Mitral regurgitation   Anemia of chronic disease   OSA (obstructive sleep apnea)   Atrial fibrillation with RVR (HCC)   Pacemaker   Hypothyroidism   CKD (chronic kidney disease) stage 5, GFR less than 15 ml/min (HCC)   PVC (premature ventricular contraction)   Pressure injury of skin    Discharge Condition: Improved.  Diet recommendation: renal  Wound care: None.  Code status: Full.   History of Present Illness:   Gina [name] is a 78 y.o. female with medical history significant of chronic systolic congestive heart failure due to nonischemic cardiomyopathy with ejection fraction of 20%, left bundle branch block, stage V CKD, A. fib status post AV nodal ablation with [employer] Jude PPM in 2019-on Eliquis, anemia of chronic disease, hypertension, renal artery stenosis, obstructive sleep apnea on CPAP presents to emergency department due to worsening shortness of breath since 3 weeks.  Patient tells me that she went to heart failure clinic this morning-due to her symptoms she was brought to the emergency department for further evaluation and management.  Patient reports exertional shortness of breath even with few  steps, abdominal distention and weight gain.  She has been compliant with her diuretics and low-sodium diet.  No history of smoking, alcohol, illicit drug use.  She reports 20 pound weight gain, water retention mostly in the abdomen.  She has stage V kidney disease and she is nearing hemodialysis.  She is awaiting consultation with vascular surgeon for placement of HD access.  She reports that she still makes urine.  Recently diagnosed with hypothyroidism and takes levothyroxine as prescribed.  She has obstructive sleep apnea and she uses CPAP and 2 L of oxygen at bedtime only.  She denies chest pain, palpitation, leg swelling, orthopnea, PND, nausea, vomiting, abdominal pain, diarrhea, headache, blurry vision, lightheadedness or dizziness.    Hospital Course by Problem:    Acute on chronic systolic congestive heart failure exacerbation: -Cardiology and nephrology has been helping with patient care during this admission. -Patient was treated with IV lasix 100 mg IV BID. Lasix has been discontinue, now that patient will start HD -An attempt was made by cardiology on 12/2018 to place an LV lead in the patient with removal of lateral leads to improve her output.  This was unsuccessful due to upper extremity thrombosis.  The plan now is for the patient to return for this procedure after her AV fistula is mature and  stable -started on HD 11/30  Stage V chronic kidney disease, progression of end-stage renal disease: -Nephrology was consulted -Underwent tunnel catheter placement on 11/30. -HD started 11/30 -s/p vascular access on 12/3  Hyperkalemia: Resolved  left knee pain: Knee  erythematosus swollen.  Patient was evaluated by orthopedic who is recommending local [place].  Weightbearing as tolerated.  Follow-up with Dr. Benjaman Kindler in 2 to 4 weeks.  A. fib chronic    Continue with carvedilol. -amiodarone -resume eliquis  Anemia of chronic disease: With mild iron deficiency  anemia: Monitor hemoglobin.  Hypothyroidism: Continue with levothyroxine  Obstructive  sleep apnea:  -Continue with CPAP  pressure ulcer- present on admission Pressure Injury 01/27/19 Buttocks Right;Left Stage I -  Intact skin with non-blanchable redness of a localized area usually over a bony prominence. (Active)  01/27/19 1700  Location: Buttocks  Location Orientation: Right;Left  Staging: Stage I -  Intact skin with non-blanchable redness of a localized area usually over a bony prominence.  Wound Description (Comments):   Present on Admission: Yes   Deconditioning -patient refused SNF and was not a candidate for CIR  obesity Estimated body mass index is 36.4 kg/m as calculated from the following:   Height as of this encounter: 5\' 4"  (1.626 m).   Weight as of this encounter: 96.2 kg.      Medical Consultants:   Renal Cardiology vascular   Discharge Exam:   Vitals:   02/06/19 0617 02/06/19 0839  BP: 106/63 (!) 109/57  Pulse: 73 73  Resp: 19 18  Temp: 98.1 F (36.7 C) 99 F (37.2 C)  SpO2: 100% 96%   Vitals:   02/05/19 2140 02/06/19 0617 02/06/19 0621 02/06/19 0839  BP: 120/64 106/63  (!) 109/57  Pulse: 71 73  73  Resp: 18 19  18   Temp: 97.8 F (36.6 C) 98.1 F (36.7 C)  99 F (37.2 C)  TempSrc: Oral Oral  Oral  SpO2: 100% 100%  96%  Weight: 95 kg  94.1 kg   Height:        General exam: Appears calm and comfortable.   The results of significant diagnostics from this hospitalization (including imaging, microbiology, ancillary and laboratory) are listed below for reference.     Procedures and Diagnostic Studies:   Dg Chest 2 View  Result Date: 01/27/2019 CLINICAL DATA:  Shortness of breath, history of CHF. EXAM: CHEST - 2 VIEW COMPARISON:  09/20/2017 FINDINGS: Heart size is markedly enlarged as before. Pulmonary vascular congestion is noted without frank edema or signs of pleural effusion. No signs of consolidation. Left-sided dual lead  pacer device in situ as before. No acute bone finding. Signs of calcified atherosclerosis. IMPRESSION: Cardiomegaly and pulmonary vascular congestion. No frank edema or pleural effusions. Electronically Signed   By: Zetta Bills M.D.   On: 01/27/2019 12:06     Labs:   Basic Metabolic Panel: Recent Labs  Lab 02/02/19 0548 02/03/19 0419 02/03/19 1120 02/04/19 0453 02/05/19 0628 02/06/19 0351  NA 138 137 140 137 133* 137  K 3.9 3.5 3.8 4.2 4.6 4.2  CL 97* 95* 99 99 97* 99  CO2 30 27 29 23 22 27   GLUCOSE 102* 94 119* 101* 88 92  BUN 75* 84* 42* 64* 78* 44*  CREATININE 2.82* 3.04* 1.93* 2.91* 3.29* 2.26*  CALCIUM 8.4* 8.4* 8.2* 8.1* 8.4* 8.6*  MG 2.2 2.2  --  2.1 2.2 2.1  PHOS 4.3 4.8* 2.9 3.8 4.9* 3.7   GFR Estimated Creatinine Clearance: 22.8 mL/min (A) (by C-G formula based on SCr of 2.26 mg/dL (H)). Liver Function Tests: Recent Labs  Lab 02/03/19 0419 02/03/19 1120 02/04/19 0453 02/05/19 0628 02/06/19 0351  ALBUMIN 2.7* 3.0* 2.7* 2.8* 2.7*   No results for input(s):  LIPASE, AMYLASE in the last 168 hours. No results for input(s): AMMONIA in the last 168 hours. Coagulation profile Recent Labs  Lab 02/01/19 0505  INR 1.4*    CBC: Recent Labs  Lab 01/31/19 0441  02/03/19 0419 02/03/19 1610 02/04/19 0453 02/05/19 0628 02/06/19 0351  WBC 5.8   < > 4.6 5.9 4.9 4.6 5.3  NEUTROABS 4.5  --   --   --   --   --   --   HGB 10.2*   < > 10.4* 10.4* 10.9* 10.2* 10.3*  HCT 32.7*   < > 33.6* 33.4* 35.9* 32.3* 32.5*  MCV 108.3*   < > 107.3* 106.4* 109.8* 105.6* 105.9*  PLT 184   < > 187 189 177 177 195   < > = values in this interval not displayed.   Cardiac Enzymes: No results for input(s): CKTOTAL, CKMB, CKMBINDEX, TROPONINI in the last 168 hours. BNP: Invalid input(s): POCBNP CBG: No results for input(s): GLUCAP in the last 168 hours. D-Dimer No results for input(s): DDIMER in the last 72 hours. Hgb A1c No results for input(s): HGBA1C in the last 72  hours. Lipid Profile No results for input(s): CHOL, HDL, LDLCALC, TRIG, CHOLHDL, LDLDIRECT in the last 72 hours. Thyroid function studies No results for input(s): TSH, T4TOTAL, T3FREE, THYROIDAB in the last 72 hours.  Invalid input(s): FREET3 Anemia work up No results for input(s): VITAMINB12, FOLATE, FERRITIN, TIBC, IRON, RETICCTPCT in the last 72 hours. Microbiology Recent Results (from the past 240 hour(s))  SARS CORONAVIRUS 2 (TAT 6-24 HRS) Nasopharyngeal Nasopharyngeal Swab     Status: None   Collection Time: 01/27/19  2:53 PM   Specimen: Nasopharyngeal Swab  Result Value Ref Range Status   SARS Coronavirus 2 NEGATIVE NEGATIVE Final    Comment: (NOTE) SARS-CoV-2 target nucleic acids are NOT DETECTED. The SARS-CoV-2 RNA is generally detectable in upper and lower respiratory specimens during the acute phase of infection. Negative results do not preclude SARS-CoV-2 infection, do not rule out co-infections with other pathogens, and should not be used as the sole basis for treatment or other patient management decisions. Negative results must be combined with clinical observations, patient history, and epidemiological information. The expected result is Negative. Fact Sheet for Patients: SugarRoll.be Fact Sheet for Healthcare Providers: https://www.woods-mathews.com/ This test is not yet approved or cleared by the [place] FDA and  has been authorized for detection and/or diagnosis of SARS-CoV-2 by FDA under an Emergency Use Authorization (EUA). This EUA will remain  in effect (meaning this test can be used) for the duration of the COVID-19 declaration under Section 56 4(b)(1) of the Act, 21 U.S.C. section 360bbb-3(b)(1), unless the authorization is terminated or revoked sooner. Performed at [facility], [place] [address] [place], [place] [address]   SARS CORONAVIRUS 2 (TAT 6-24 HRS) Nasopharyngeal Nasopharyngeal Swab      Status: None   Collection Time: 02/05/19 11:08 AM   Specimen: Nasopharyngeal Swab  Result Value Ref Range Status   SARS Coronavirus 2 NEGATIVE NEGATIVE Final    Comment: (NOTE) SARS-CoV-2 target nucleic acids are NOT DETECTED. The SARS-CoV-2 RNA is generally detectable in upper and lower respiratory specimens during the acute phase of infection. Negative results do not preclude SARS-CoV-2 infection, do not rule out co-infections with other pathogens, and should not be used as the sole basis for treatment or other patient management decisions. Negative results must be combined with clinical observations, patient history, and epidemiological information. The expected result is Negative.  Fact Sheet for Patients: SugarRoll.be Fact Sheet for Healthcare Providers: https://www.woods-mathews.com/ This test is not yet approved or cleared by the [place] FDA and  has been authorized for detection and/or diagnosis of SARS-CoV-2 by FDA under an Emergency Use Authorization (EUA). This EUA will remain  in effect (meaning this test can be used) for the duration of the COVID-19 declaration under Section 56 4(b)(1) of the Act, 21 U.S.C. section 360bbb-3(b)(1), unless the authorization is terminated or revoked sooner. Performed at [facility], [place] [address] [place], [place] [address]      Discharge Instructions:   Discharge Instructions    Discharge instructions   Complete by: As directed    next hemodialysis treatment on Tuesday, 02/09/2019 as an outpatient in [place], [place] Renal diet with fluid restriction to 1278ml Resume home O2 PRN Home health   Increase activity slowly   Complete by: As directed      Allergies as of 02/06/2019      Reactions   Bactrim [sulfamethoxazole-trimethoprim] Other (See Comments)   Flu like symptoms   Cephalexin Rash   REACTION: Rash to arms/legs      Medication List    STOP taking these  medications   metolazone 2.5 MG tablet Commonly known as: ZAROXOLYN   multivitamin tablet   potassium chloride SA 20 MEQ tablet Commonly known as: KLOR-CON   PRESERVISION AREDS 2+MULTI VIT PO   torsemide 20 MG tablet Commonly known as: DEMADEX     TAKE these medications   acetaminophen 325 MG tablet Commonly known as: TYLENOL Take 2 tablets (650 mg total) by mouth every 4 (four) hours as needed for headache or mild pain. What changed:   when to take this  reasons to take this   amiodarone 200 MG tablet Commonly known as: PACERONE TAKE 1 TABLET(200 MG) BY MOUTH DAILY What changed: See the new instructions.   carvedilol 12.5 MG tablet Commonly known as: COREG Take 1 tablet (12.5 mg total) by mouth 2 (two) times daily.   Eliquis 5 MG Tabs tablet Generic drug: apixaban TAKE 1 TABLET(5 MG) BY MOUTH TWICE DAILY What changed: See the new instructions.   ferrous sulfate 325 (65 FE) MG tablet Take 325 mg by mouth daily with breakfast.   levothyroxine 75 MCG tablet Commonly known as: SYNTHROID Take 75 mcg by mouth daily before breakfast.   multivitamin Tabs tablet Take 1 tablet by mouth at bedtime.      Follow-up Information    [facility] In 6 weeks.   Specialty: Vascular Surgery Why: Office will call you to arrange your appt (sent) Contact information: [place], Dhruv B, MD Follow up in 1 week(s).   Specialty: Internal Medicine Contact information: [place] [address] [phone]        Arnoldo Lenis, MD .   Specialty: Cardiology Contact information: [place] [address] [phone]            Time coordinating discharge: 45 min  Signed:  Geradine Girt DO  [facility] 02/06/2019, 10:05 AM

## 2019-02-06 NOTE — Progress Notes (Addendum)
Page to MD to discuss DC plan.   Pt appears to be requiring an increased level of assistance from baseline, per PT eval two days ago pt was one assist. (possible with gait-belt, maybe) but pt is unable to transfer to wheelchair, therefore unable to and from car once she arrives home.  Pt's family member is at bedside, is agreeable with staffs concerns.    Call received from Dr Eliseo Squires who is on call with this RN, patient and her niece. During the call the patient reiterates her adamant refusal of SNF for rehab. Patient seems to think she, plus her husband, plus her niece, plus hired CNA and separately organized existing [place] will be adequate supplies/resources for recovery.  Pt's niece seems to be supportive of patient's plan/goal with returning home to home health.   Pt is however, unable to transfer from bed to wheelchair for discharge, she will be receiving transport by [facility] to her home in [place].   [facility] set up by D.Swist, RN, Case Manager.

## 2019-02-06 NOTE — Discharge Instructions (Signed)
° ° ° ° °  [facility]  Discharge Instructions  AV Fistula or Graft Surgery for Dialysis Access  Please refer to the following instructions for your post-procedure care. Your surgeon or physician assistant will discuss any changes with you.  Activity  You may drive the day following your surgery, if you are comfortable and no longer taking prescription pain medication. Resume full activity as the soreness in your incision resolves.  Bathing/Showering  You may shower after you go home. Keep your incision dry for 48 hours. Do not soak in a bathtub, hot tub, or swim until the incision heals completely. You may not shower if you have a hemodialysis catheter.  Incision Care  Clean your incision with mild soap and water after 48 hours. Pat the area dry with a clean towel. You do not need a bandage unless otherwise instructed. Do not apply any ointments or creams to your incision. You may have skin glue on your incision. Do not peel it off. It will come off on its own in about one week. Your arm may swell a bit after surgery. To reduce swelling use pillows to elevate your arm so it is above your heart. Your doctor will tell you if you need to lightly wrap your arm with an ACE bandage.  Diet  Resume your normal diet. There are not special food restrictions following this procedure. In order to heal from your surgery, it is CRITICAL to get adequate nutrition. Your body requires vitamins, minerals, and protein. Vegetables are the best source of vitamins and minerals. Vegetables also provide the perfect balance of protein. Processed food has little nutritional value, so try to avoid this.  Medications  Resume taking all of your medications. If your incision is causing pain, you may take over-the counter pain relievers such as acetaminophen (Tylenol). If you were prescribed a stronger pain medication, please be aware these medications can cause nausea and constipation.  Prevent nausea by taking the medication with a snack or meal. Avoid constipation by drinking plenty of fluids and eating foods with high amount of fiber, such as fruits, vegetables, and grains.  Do not take Tylenol if you are taking prescription pain medications.  Follow up Your surgeon may want to see you in the office following your access surgery. If so, this will be arranged at the time of your surgery.  Please call us immediately for any of the following conditions:  Increased pain, redness, drainage (pus) from your incision site Fever of 101 degrees or higher Severe or worsening pain at your incision site Hand pain or numbness.  Reduce your risk of vascular disease:  Stop smoking. If you would like help, call [facility] at 1-800-QUIT-NOW (684) 031-4808) or [place] at [place] your cholesterol Maintain a desired weight Control your diabetes Keep your blood pressure down  Dialysis  It will take several weeks to several months for your new dialysis access to be ready for use. Your surgeon will determine when it is okay to use it. Your nephrologist will continue to direct your dialysis. You can continue to use your Permcath until your new access is ready for use.   02/04/2019 ODELL BROCCOLI [id number] 1941/01/22  Surgeon(s): Waynetta Sandy, MD  Procedure(s): Creation right 1st stage basilic vein transposition   x Do not stick fistula for 312 weeks    If you have any questions, please call the office at [phone].

## 2019-02-08 ENCOUNTER — Encounter: Payer: Medicare Other | Admitting: Internal Medicine

## 2019-02-09 DIAGNOSIS — L89312 Pressure ulcer of right buttock, stage 2: Secondary | ICD-10-CM | POA: Diagnosis not present

## 2019-02-09 DIAGNOSIS — S51811D Laceration without foreign body of right forearm, subsequent encounter: Secondary | ICD-10-CM | POA: Diagnosis not present

## 2019-02-09 DIAGNOSIS — E46 Unspecified protein-calorie malnutrition: Secondary | ICD-10-CM | POA: Diagnosis not present

## 2019-02-09 DIAGNOSIS — I482 Chronic atrial fibrillation, unspecified: Secondary | ICD-10-CM | POA: Diagnosis not present

## 2019-02-09 DIAGNOSIS — Z9981 Dependence on supplemental oxygen: Secondary | ICD-10-CM | POA: Diagnosis not present

## 2019-02-09 DIAGNOSIS — I2510 Atherosclerotic heart disease of native coronary artery without angina pectoris: Secondary | ICD-10-CM | POA: Diagnosis not present

## 2019-02-09 DIAGNOSIS — Z23 Encounter for immunization: Secondary | ICD-10-CM | POA: Diagnosis not present

## 2019-02-09 DIAGNOSIS — Z7901 Long term (current) use of anticoagulants: Secondary | ICD-10-CM | POA: Diagnosis not present

## 2019-02-09 DIAGNOSIS — D509 Iron deficiency anemia, unspecified: Secondary | ICD-10-CM | POA: Diagnosis not present

## 2019-02-09 DIAGNOSIS — D638 Anemia in other chronic diseases classified elsewhere: Secondary | ICD-10-CM | POA: Diagnosis not present

## 2019-02-09 DIAGNOSIS — E669 Obesity, unspecified: Secondary | ICD-10-CM | POA: Diagnosis not present

## 2019-02-09 DIAGNOSIS — M150 Primary generalized (osteo)arthritis: Secondary | ICD-10-CM | POA: Diagnosis not present

## 2019-02-09 DIAGNOSIS — E039 Hypothyroidism, unspecified: Secondary | ICD-10-CM | POA: Diagnosis not present

## 2019-02-09 DIAGNOSIS — N186 End stage renal disease: Secondary | ICD-10-CM | POA: Diagnosis not present

## 2019-02-09 DIAGNOSIS — D696 Thrombocytopenia, unspecified: Secondary | ICD-10-CM | POA: Diagnosis not present

## 2019-02-09 DIAGNOSIS — I051 Rheumatic mitral insufficiency: Secondary | ICD-10-CM | POA: Diagnosis not present

## 2019-02-09 DIAGNOSIS — E878 Other disorders of electrolyte and fluid balance, not elsewhere classified: Secondary | ICD-10-CM | POA: Diagnosis not present

## 2019-02-09 DIAGNOSIS — Z87891 Personal history of nicotine dependence: Secondary | ICD-10-CM | POA: Diagnosis not present

## 2019-02-09 DIAGNOSIS — I0981 Rheumatic heart failure: Secondary | ICD-10-CM | POA: Diagnosis not present

## 2019-02-09 DIAGNOSIS — Z6836 Body mass index (BMI) 36.0-36.9, adult: Secondary | ICD-10-CM | POA: Diagnosis not present

## 2019-02-09 DIAGNOSIS — Z992 Dependence on renal dialysis: Secondary | ICD-10-CM | POA: Diagnosis not present

## 2019-02-09 DIAGNOSIS — I447 Left bundle-branch block, unspecified: Secondary | ICD-10-CM | POA: Diagnosis not present

## 2019-02-09 DIAGNOSIS — I701 Atherosclerosis of renal artery: Secondary | ICD-10-CM | POA: Diagnosis not present

## 2019-02-09 DIAGNOSIS — N185 Chronic kidney disease, stage 5: Secondary | ICD-10-CM | POA: Diagnosis not present

## 2019-02-09 DIAGNOSIS — G4733 Obstructive sleep apnea (adult) (pediatric): Secondary | ICD-10-CM | POA: Diagnosis not present

## 2019-02-09 DIAGNOSIS — I5023 Acute on chronic systolic (congestive) heart failure: Secondary | ICD-10-CM | POA: Diagnosis not present

## 2019-02-09 DIAGNOSIS — D631 Anemia in chronic kidney disease: Secondary | ICD-10-CM | POA: Diagnosis not present

## 2019-02-09 DIAGNOSIS — N2581 Secondary hyperparathyroidism of renal origin: Secondary | ICD-10-CM | POA: Diagnosis not present

## 2019-02-09 DIAGNOSIS — Z950 Presence of cardiac pacemaker: Secondary | ICD-10-CM | POA: Diagnosis not present

## 2019-02-09 DIAGNOSIS — I132 Hypertensive heart and chronic kidney disease with heart failure and with stage 5 chronic kidney disease, or end stage renal disease: Secondary | ICD-10-CM | POA: Diagnosis not present

## 2019-02-09 DIAGNOSIS — D500 Iron deficiency anemia secondary to blood loss (chronic): Secondary | ICD-10-CM | POA: Diagnosis not present

## 2019-02-11 DIAGNOSIS — D509 Iron deficiency anemia, unspecified: Secondary | ICD-10-CM | POA: Diagnosis not present

## 2019-02-11 DIAGNOSIS — N186 End stage renal disease: Secondary | ICD-10-CM | POA: Diagnosis not present

## 2019-02-11 DIAGNOSIS — I132 Hypertensive heart and chronic kidney disease with heart failure and with stage 5 chronic kidney disease, or end stage renal disease: Secondary | ICD-10-CM | POA: Diagnosis not present

## 2019-02-11 DIAGNOSIS — E46 Unspecified protein-calorie malnutrition: Secondary | ICD-10-CM | POA: Diagnosis not present

## 2019-02-11 DIAGNOSIS — N185 Chronic kidney disease, stage 5: Secondary | ICD-10-CM | POA: Diagnosis not present

## 2019-02-11 DIAGNOSIS — D631 Anemia in chronic kidney disease: Secondary | ICD-10-CM | POA: Diagnosis not present

## 2019-02-11 DIAGNOSIS — D500 Iron deficiency anemia secondary to blood loss (chronic): Secondary | ICD-10-CM | POA: Diagnosis not present

## 2019-02-11 DIAGNOSIS — I2510 Atherosclerotic heart disease of native coronary artery without angina pectoris: Secondary | ICD-10-CM | POA: Diagnosis not present

## 2019-02-11 DIAGNOSIS — I0981 Rheumatic heart failure: Secondary | ICD-10-CM | POA: Diagnosis not present

## 2019-02-11 DIAGNOSIS — I5023 Acute on chronic systolic (congestive) heart failure: Secondary | ICD-10-CM | POA: Diagnosis not present

## 2019-02-11 DIAGNOSIS — Z992 Dependence on renal dialysis: Secondary | ICD-10-CM | POA: Diagnosis not present

## 2019-02-11 DIAGNOSIS — I051 Rheumatic mitral insufficiency: Secondary | ICD-10-CM | POA: Diagnosis not present

## 2019-02-12 DIAGNOSIS — I5023 Acute on chronic systolic (congestive) heart failure: Secondary | ICD-10-CM | POA: Diagnosis not present

## 2019-02-12 DIAGNOSIS — I2510 Atherosclerotic heart disease of native coronary artery without angina pectoris: Secondary | ICD-10-CM | POA: Diagnosis not present

## 2019-02-12 DIAGNOSIS — N185 Chronic kidney disease, stage 5: Secondary | ICD-10-CM | POA: Diagnosis not present

## 2019-02-12 DIAGNOSIS — I0981 Rheumatic heart failure: Secondary | ICD-10-CM | POA: Diagnosis not present

## 2019-02-12 DIAGNOSIS — I132 Hypertensive heart and chronic kidney disease with heart failure and with stage 5 chronic kidney disease, or end stage renal disease: Secondary | ICD-10-CM | POA: Diagnosis not present

## 2019-02-12 DIAGNOSIS — I051 Rheumatic mitral insufficiency: Secondary | ICD-10-CM | POA: Diagnosis not present

## 2019-02-13 DIAGNOSIS — E46 Unspecified protein-calorie malnutrition: Secondary | ICD-10-CM | POA: Diagnosis not present

## 2019-02-13 DIAGNOSIS — Z992 Dependence on renal dialysis: Secondary | ICD-10-CM | POA: Diagnosis not present

## 2019-02-13 DIAGNOSIS — N186 End stage renal disease: Secondary | ICD-10-CM | POA: Diagnosis not present

## 2019-02-13 DIAGNOSIS — D500 Iron deficiency anemia secondary to blood loss (chronic): Secondary | ICD-10-CM | POA: Diagnosis not present

## 2019-02-13 DIAGNOSIS — D631 Anemia in chronic kidney disease: Secondary | ICD-10-CM | POA: Diagnosis not present

## 2019-02-13 DIAGNOSIS — D509 Iron deficiency anemia, unspecified: Secondary | ICD-10-CM | POA: Diagnosis not present

## 2019-02-15 ENCOUNTER — Other Ambulatory Visit (HOSPITAL_COMMUNITY): Payer: Self-pay

## 2019-02-15 ENCOUNTER — Encounter (HOSPITAL_COMMUNITY): Payer: Self-pay | Admitting: Cardiology

## 2019-02-15 ENCOUNTER — Other Ambulatory Visit: Payer: Self-pay

## 2019-02-15 ENCOUNTER — Ambulatory Visit (HOSPITAL_COMMUNITY)
Admission: RE | Admit: 2019-02-15 | Discharge: 2019-02-15 | Disposition: A | Discharge status: Home / Self Care | Payer: Medicare Other | Source: Ambulatory Visit | Attending: Cardiology | Admitting: Cardiology

## 2019-02-15 VITALS — BP 95/55 | HR 73 | Wt 206.8 lb

## 2019-02-15 DIAGNOSIS — I5042 Chronic combined systolic (congestive) and diastolic (congestive) heart failure: Secondary | ICD-10-CM | POA: Diagnosis not present

## 2019-02-15 DIAGNOSIS — I701 Atherosclerosis of renal artery: Secondary | ICD-10-CM | POA: Diagnosis not present

## 2019-02-15 DIAGNOSIS — Z87891 Personal history of nicotine dependence: Secondary | ICD-10-CM | POA: Diagnosis not present

## 2019-02-15 DIAGNOSIS — Z992 Dependence on renal dialysis: Secondary | ICD-10-CM | POA: Insufficient documentation

## 2019-02-15 DIAGNOSIS — G4733 Obstructive sleep apnea (adult) (pediatric): Secondary | ICD-10-CM | POA: Insufficient documentation

## 2019-02-15 DIAGNOSIS — N186 End stage renal disease: Secondary | ICD-10-CM | POA: Diagnosis not present

## 2019-02-15 DIAGNOSIS — I5022 Chronic systolic (congestive) heart failure: Secondary | ICD-10-CM | POA: Insufficient documentation

## 2019-02-15 DIAGNOSIS — I428 Other cardiomyopathies: Secondary | ICD-10-CM | POA: Insufficient documentation

## 2019-02-15 DIAGNOSIS — I493 Ventricular premature depolarization: Secondary | ICD-10-CM | POA: Diagnosis not present

## 2019-02-15 DIAGNOSIS — I4819 Other persistent atrial fibrillation: Secondary | ICD-10-CM | POA: Diagnosis not present

## 2019-02-15 DIAGNOSIS — I120 Hypertensive chronic kidney disease with stage 5 chronic kidney disease or end stage renal disease: Secondary | ICD-10-CM | POA: Insufficient documentation

## 2019-02-15 DIAGNOSIS — D649 Anemia, unspecified: Secondary | ICD-10-CM | POA: Diagnosis not present

## 2019-02-15 DIAGNOSIS — R5383 Other fatigue: Secondary | ICD-10-CM | POA: Insufficient documentation

## 2019-02-15 DIAGNOSIS — I482 Chronic atrial fibrillation, unspecified: Secondary | ICD-10-CM | POA: Insufficient documentation

## 2019-02-15 DIAGNOSIS — Z7901 Long term (current) use of anticoagulants: Secondary | ICD-10-CM | POA: Insufficient documentation

## 2019-02-15 DIAGNOSIS — R531 Weakness: Secondary | ICD-10-CM | POA: Diagnosis present

## 2019-02-15 DIAGNOSIS — Z79899 Other long term (current) drug therapy: Secondary | ICD-10-CM | POA: Diagnosis not present

## 2019-02-15 MED ORDER — TORSEMIDE 20 MG PO TABS: 20.0000 mg | ORAL_TABLET | ORAL | 5 refills | Status: DC

## 2019-02-15 MED ORDER — TORSEMIDE 20 MG PO TABS: 20.0000 mg | ORAL_TABLET | Freq: Three times a day (TID) | ORAL | 5 refills | Status: DC

## 2019-02-15 NOTE — Progress Notes (Signed)
PCP: Dr Woody Seller  HF Cardiology: Dr Aundra Dubin  Neprology: Dr Hinda Lenis EP: Dr Caryl Comes   HPI: Gina [name] is a 78 y.o.female with h/o chronic systolic CHF, anemia, HTN, LBBB, renal artery stenosis, and OSA.   Admitted 09/02/2017 with peripheral edema and worsening SOB. Attempts at diuresis were difficult due to AKI. Transferred to [facility] 09/10/17 in setting of cardiogenic shock 2/2 Afib RVR. She required milrinone.  She underwent DCCV 7/11 and 7/14. Discussed with EP after she went back into Afib a third time. Underwent DCCV 7/18 and stayed in for NSR most of the day, but then went back into Afib. Underwent AV nodal ablation and St Jude PPM implantation 09/19/17 with Dr. Caryl Comes. Unable to place an LV lead. She was discharged to SNF. Echo in 7/19 with EF 20%.    Zio patch in 4/20 showed atrial fibrillation with average rate 73, rare PVCs and PACs.   Echo in 6/20 showed EF 20% with diffuse hypokinesis and mild dilation, moderate MR, mildly decreased RV systolic function.   Has been rising gradually over time. In 11/20, she was admitted with intractable volume overload and markedly high BUN/creatinine. Decision was made to start HD.  She is not getting regular HD in [place].   She returns today for followup of CHF. She is still very weak, getting PT at home.  Uses a wheelchair when she goes out, walks around the house with a walker.  Not short of breath with walker but fatigues easily.  No orthopnea/PND.  No lightheadedness.  Seems to be tolerating HD ok.  Fistula construction has been started, needs to see Dr. Donzetta Matters for 2nd part.  No chest pain.   St Jude device reviewed: Stable thoracic impedance, >99% RV pacing.   Labs (1/20): K 3.3, creatinine 2.45 Labs (3/20): K 3.2, creatinine 2.64, LFTs normal Labs (6/20): LFTs, normal, TSH elevated, creatinine 3.87 Labs (12/20): hgb 10.4  PMH: 1. OSA: Uses CPAP.  2. Atrial fibrillation: permanent.  Now s/p AV nodal ablation with St Jude PPM.  3. Chronic  systolic CHF: Since [id number], nonischemic cardiomyopathy.  - LHC (2017): Normal coronaries.  - Echo (7/19): EF 20% with mild LV dilation and diffuse hypokinesis, mild RV dilation with mild to moderate systolic dysfunction.  - Low output HF in 7/19 requiring milrinone.  - Unable to place LV lead at time of AV nodal ablation and pacing.  - Echo (6/20): EF 20%, mild diffuse hypokinesis, mild dilation, mildly decreased RV systolic function with moderate MR, PASP 47.  4. ESRD 5. H/o LBBB 6. Renal artery stenosis: 80% on left.  7. Frequent PVCs - Zio patch in 4/20: Atrial fibrillation with average rate 73, rare PVCs and PACs.   Review of systems complete and found to be negative unless listed in HPI.   Social History   Socioeconomic History  . Marital status: Married    Spouse name: Not on file  . Number of children: 0  . Years of education: Not on file  . Highest education level: Not on file  Occupational History  . Not on file  Tobacco Use  . Smoking status: Former Smoker    Packs/day: 1.00    Years: 24.00    Pack years: 24.00    Types: Cigarettes    Start date: 10/29/1958    Quit date: 12/02/1985    Years since quitting: 33.2  . Smokeless tobacco: Never Used  Substance and Sexual Activity  . Alcohol use: No    Alcohol/week: 0.0 standard  drinks  . Drug use: Never  . Sexual activity: Not on file  Other Topics Concern  . Not on file  Social History Narrative  . Not on file   Social Determinants of Health   Financial Resource Strain:   . Difficulty of Paying Living Expenses: Not on file  Food Insecurity:   . Worried About Charity fundraiser in the Last Year: Not on file  . Ran Out of Food in the Last Year: Not on file  Transportation Needs:   . Lack of Transportation (Medical): Not on file  . Lack of Transportation (Non-Medical): Not on file  Physical Activity:   . Days of Exercise per Week: Not on file  . Minutes of Exercise per Session: Not on file  Stress:   . Feeling  of Stress : Not on file  Social Connections:   . Frequency of Communication with Friends and Family: Not on file  . Frequency of Social Gatherings with Friends and Family: Not on file  . Attends Religious Services: Not on file  . Active Member of Clubs or Organizations: Not on file  . Attends Archivist Meetings: Not on file  . Marital Status: Not on file  Intimate Partner Violence:   . Fear of Current or Ex-Partner: Not on file  . Emotionally Abused: Not on file  . Physically Abused: Not on file  . Sexually Abused: Not on file    Family History  Problem Relation Age of Onset  . Rheumatic fever Mother        in her early 83's  . Alzheimer's disease Mother   . Stomach cancer Father 61       died 65  . Bradycardia Brother        has pacemaker  . Heart failure Paternal Grandmother   . Heart failure Paternal Grandfather     Current Outpatient Medications  Medication Sig Dispense Refill  . acetaminophen (TYLENOL) 325 MG tablet Take 650 mg by mouth at bedtime as needed.    [name] amiodarone (PACERONE) 200 MG tablet Take 200 mg by mouth daily.    [name] apixaban (ELIQUIS) 5 MG TABS tablet Take 5 mg by mouth 2 (two) times daily.    . carvedilol (COREG) 12.5 MG tablet Take 1 tablet (12.5 mg total) by mouth 2 (two) times daily. 90 tablet 3  . ferrous sulfate 325 (65 FE) MG tablet Take 325 mg by mouth daily with breakfast.    . levothyroxine (SYNTHROID) 75 MCG tablet Take 75 mcg by mouth daily before breakfast.    . multivitamin (RENA-VIT) TABS tablet Take 1 tablet by mouth at bedtime. 30 tablet 0  . torsemide (DEMADEX) 20 MG tablet Take 1 tablet (20 mg total) by mouth 3 (three) times a week. Monday, Wednesday and Friday 15 tablet 5   No current facility-administered medications for this encounter.    Vitals:   02/15/19 1037  BP: (!) 95/55  Pulse: 73  SpO2: 100%  Weight: 93.8 kg (206 lb 12.8 oz)   Wt Readings from Last 3 Encounters:  02/15/19 93.8 kg (206 lb 12.8 oz)    02/06/19 94.1 kg (207 lb 7.3 oz)  12/28/18 97.5 kg (215 lb)   PHYSICAL EXAM: General: NAD Neck: JVP 8-9 cm, no thyromegaly or thyroid nodule.  Lungs: Clear to auscultation bilaterally with normal respiratory effort. CV: Nondisplaced PMI.  Heart regular S1/S2, no S3/S4, no murmur.  Trace ankle edema.  No carotid bruit.  Normal pedal pulses.  Abdomen:  Soft, nontender, no hepatosplenomegaly, no distention.  Skin: Intact without lesions or rashes.  Neurologic: Alert and oriented x 3.  Psych: Normal affect. Extremities: No clubbing or cyanosis.  HEENT: Normal.   ASSESSMENT & PLAN: 1. Chronic Systolic CHF: Echo ([id number]) with EF 20%. Long-standing cardiomyopathy, nonischemic based on cath in 2017. 7/19 hospitalization withlow output HF likely triggered by atrial fibrillation/RVR.  She was started on milrinone.  DCCVs failed and she ultimately had AV nodal ablation with St Jude PPM placed (unable to place LV lead).  Echo in 6/20 showed EF 20%.  Mild volume overload on exam though Corvue shows stable thoracic impedance.  NYHA class III symptoms.   - Think it may be helpful to remove a bit more fluid with HD.  - She is on torsemide 20 mg daily and still makes some urine.  I recommended that she just take torsemide on non-HD days.   - Continue Coreg 12.5 mg bid.    - >99% RV pacing on device interrogation.  Plan at this point is for removal of RA lead and placement of LV lead once she is using AV fistula and has the dialysis catheter out (less infection risk).  2. ESRD: Dialysis in [place].  3. Atrial fibrillation: Chronic.  She is now s/p AV nodal ablation with St Jude PPM.  - Continue Eliquis 5 mg twice a day.  5. OSA - Continue CPAP qHS. 5. PVCs: She is on amiodarone for frequent PVCs (?contributing to cardiomyopathy).  Zio patch in 4/20 on amiodarone showed only rare PVCs.  - Continue amiodarone 200 mg daily.  She has had normal LFTs and TSH fairly recently.  She was told that she needs a  regular eye exam.   Followup 4 months.   [name]   02/15/2019

## 2019-02-15 NOTE — Patient Instructions (Addendum)
CHANGE Torsemide to non-Hemodialysis days. (Monday, Wednesday and Friday)  Your physician recommends that you schedule a follow-up appointment in: 4 month with Dr Aundra Dubin. Monday, April 12th, 2021 at [place] code [phone]  At the [facility], you and your health needs are our priority. As part of our continuing mission to provide you with exceptional heart care, we have created designated Provider Care Teams. These Care Teams include your primary Cardiologist (physician) and Advanced Practice Providers (APPs- Physician Assistants and Nurse Practitioners) who all work together to provide you with the care you need, when you need it.   You may see any of the following providers on your designated Care Team at your next follow up: [name] Dr Glori Bickers . Dr [name] . Darrick Grinder, NP . Lyda Jester, PA . Audry Riles, PharmD   Please be sure to bring in all your medications bottles to every appointment.

## 2019-02-16 DIAGNOSIS — Z992 Dependence on renal dialysis: Secondary | ICD-10-CM | POA: Diagnosis not present

## 2019-02-16 DIAGNOSIS — D509 Iron deficiency anemia, unspecified: Secondary | ICD-10-CM | POA: Diagnosis not present

## 2019-02-16 DIAGNOSIS — N186 End stage renal disease: Secondary | ICD-10-CM | POA: Diagnosis not present

## 2019-02-16 DIAGNOSIS — D631 Anemia in chronic kidney disease: Secondary | ICD-10-CM | POA: Diagnosis not present

## 2019-02-16 DIAGNOSIS — E46 Unspecified protein-calorie malnutrition: Secondary | ICD-10-CM | POA: Diagnosis not present

## 2019-02-16 DIAGNOSIS — D500 Iron deficiency anemia secondary to blood loss (chronic): Secondary | ICD-10-CM | POA: Diagnosis not present

## 2019-02-17 DIAGNOSIS — I2510 Atherosclerotic heart disease of native coronary artery without angina pectoris: Secondary | ICD-10-CM | POA: Diagnosis not present

## 2019-02-17 DIAGNOSIS — I0981 Rheumatic heart failure: Secondary | ICD-10-CM | POA: Diagnosis not present

## 2019-02-17 DIAGNOSIS — I051 Rheumatic mitral insufficiency: Secondary | ICD-10-CM | POA: Diagnosis not present

## 2019-02-17 DIAGNOSIS — I132 Hypertensive heart and chronic kidney disease with heart failure and with stage 5 chronic kidney disease, or end stage renal disease: Secondary | ICD-10-CM | POA: Diagnosis not present

## 2019-02-17 DIAGNOSIS — N185 Chronic kidney disease, stage 5: Secondary | ICD-10-CM | POA: Diagnosis not present

## 2019-02-17 DIAGNOSIS — I5023 Acute on chronic systolic (congestive) heart failure: Secondary | ICD-10-CM | POA: Diagnosis not present

## 2019-02-18 ENCOUNTER — Encounter (HOSPITAL_COMMUNITY): Payer: Medicare Other | Admitting: Cardiology

## 2019-02-18 DIAGNOSIS — N186 End stage renal disease: Secondary | ICD-10-CM | POA: Diagnosis not present

## 2019-02-18 DIAGNOSIS — D631 Anemia in chronic kidney disease: Secondary | ICD-10-CM | POA: Diagnosis not present

## 2019-02-18 DIAGNOSIS — E46 Unspecified protein-calorie malnutrition: Secondary | ICD-10-CM | POA: Diagnosis not present

## 2019-02-18 DIAGNOSIS — D500 Iron deficiency anemia secondary to blood loss (chronic): Secondary | ICD-10-CM | POA: Diagnosis not present

## 2019-02-18 DIAGNOSIS — Z992 Dependence on renal dialysis: Secondary | ICD-10-CM | POA: Diagnosis not present

## 2019-02-18 DIAGNOSIS — D509 Iron deficiency anemia, unspecified: Secondary | ICD-10-CM | POA: Diagnosis not present

## 2019-02-19 DIAGNOSIS — I2510 Atherosclerotic heart disease of native coronary artery without angina pectoris: Secondary | ICD-10-CM | POA: Diagnosis not present

## 2019-02-19 DIAGNOSIS — I5023 Acute on chronic systolic (congestive) heart failure: Secondary | ICD-10-CM | POA: Diagnosis not present

## 2019-02-19 DIAGNOSIS — N185 Chronic kidney disease, stage 5: Secondary | ICD-10-CM | POA: Diagnosis not present

## 2019-02-19 DIAGNOSIS — I132 Hypertensive heart and chronic kidney disease with heart failure and with stage 5 chronic kidney disease, or end stage renal disease: Secondary | ICD-10-CM | POA: Diagnosis not present

## 2019-02-19 DIAGNOSIS — I051 Rheumatic mitral insufficiency: Secondary | ICD-10-CM | POA: Diagnosis not present

## 2019-02-19 DIAGNOSIS — I0981 Rheumatic heart failure: Secondary | ICD-10-CM | POA: Diagnosis not present

## 2019-02-20 DIAGNOSIS — D631 Anemia in chronic kidney disease: Secondary | ICD-10-CM | POA: Diagnosis not present

## 2019-02-20 DIAGNOSIS — Z992 Dependence on renal dialysis: Secondary | ICD-10-CM | POA: Diagnosis not present

## 2019-02-20 DIAGNOSIS — D500 Iron deficiency anemia secondary to blood loss (chronic): Secondary | ICD-10-CM | POA: Diagnosis not present

## 2019-02-20 DIAGNOSIS — E46 Unspecified protein-calorie malnutrition: Secondary | ICD-10-CM | POA: Diagnosis not present

## 2019-02-20 DIAGNOSIS — N186 End stage renal disease: Secondary | ICD-10-CM | POA: Diagnosis not present

## 2019-02-20 DIAGNOSIS — D509 Iron deficiency anemia, unspecified: Secondary | ICD-10-CM | POA: Diagnosis not present

## 2019-02-22 DIAGNOSIS — I051 Rheumatic mitral insufficiency: Secondary | ICD-10-CM | POA: Diagnosis not present

## 2019-02-22 DIAGNOSIS — I2510 Atherosclerotic heart disease of native coronary artery without angina pectoris: Secondary | ICD-10-CM | POA: Diagnosis not present

## 2019-02-22 DIAGNOSIS — I0981 Rheumatic heart failure: Secondary | ICD-10-CM | POA: Diagnosis not present

## 2019-02-22 DIAGNOSIS — N185 Chronic kidney disease, stage 5: Secondary | ICD-10-CM | POA: Diagnosis not present

## 2019-02-22 DIAGNOSIS — I132 Hypertensive heart and chronic kidney disease with heart failure and with stage 5 chronic kidney disease, or end stage renal disease: Secondary | ICD-10-CM | POA: Diagnosis not present

## 2019-02-22 DIAGNOSIS — I5023 Acute on chronic systolic (congestive) heart failure: Secondary | ICD-10-CM | POA: Diagnosis not present

## 2019-02-23 ENCOUNTER — Encounter: Payer: Medicare Other | Admitting: Internal Medicine

## 2019-02-24 DIAGNOSIS — I2510 Atherosclerotic heart disease of native coronary artery without angina pectoris: Secondary | ICD-10-CM | POA: Diagnosis not present

## 2019-02-24 DIAGNOSIS — I5023 Acute on chronic systolic (congestive) heart failure: Secondary | ICD-10-CM | POA: Diagnosis not present

## 2019-02-24 DIAGNOSIS — D509 Iron deficiency anemia, unspecified: Secondary | ICD-10-CM | POA: Diagnosis not present

## 2019-02-24 DIAGNOSIS — E46 Unspecified protein-calorie malnutrition: Secondary | ICD-10-CM | POA: Diagnosis not present

## 2019-02-24 DIAGNOSIS — Z992 Dependence on renal dialysis: Secondary | ICD-10-CM | POA: Diagnosis not present

## 2019-02-24 DIAGNOSIS — I132 Hypertensive heart and chronic kidney disease with heart failure and with stage 5 chronic kidney disease, or end stage renal disease: Secondary | ICD-10-CM | POA: Diagnosis not present

## 2019-02-24 DIAGNOSIS — I0981 Rheumatic heart failure: Secondary | ICD-10-CM | POA: Diagnosis not present

## 2019-02-24 DIAGNOSIS — D500 Iron deficiency anemia secondary to blood loss (chronic): Secondary | ICD-10-CM | POA: Diagnosis not present

## 2019-02-24 DIAGNOSIS — N185 Chronic kidney disease, stage 5: Secondary | ICD-10-CM | POA: Diagnosis not present

## 2019-02-24 DIAGNOSIS — I051 Rheumatic mitral insufficiency: Secondary | ICD-10-CM | POA: Diagnosis not present

## 2019-02-24 DIAGNOSIS — N186 End stage renal disease: Secondary | ICD-10-CM | POA: Diagnosis not present

## 2019-02-24 DIAGNOSIS — D631 Anemia in chronic kidney disease: Secondary | ICD-10-CM | POA: Diagnosis not present

## 2019-02-27 DIAGNOSIS — D631 Anemia in chronic kidney disease: Secondary | ICD-10-CM | POA: Diagnosis not present

## 2019-02-27 DIAGNOSIS — D500 Iron deficiency anemia secondary to blood loss (chronic): Secondary | ICD-10-CM | POA: Diagnosis not present

## 2019-02-27 DIAGNOSIS — N186 End stage renal disease: Secondary | ICD-10-CM | POA: Diagnosis not present

## 2019-02-27 DIAGNOSIS — D509 Iron deficiency anemia, unspecified: Secondary | ICD-10-CM | POA: Diagnosis not present

## 2019-02-27 DIAGNOSIS — Z992 Dependence on renal dialysis: Secondary | ICD-10-CM | POA: Diagnosis not present

## 2019-02-27 DIAGNOSIS — E46 Unspecified protein-calorie malnutrition: Secondary | ICD-10-CM | POA: Diagnosis not present

## 2019-03-01 DIAGNOSIS — R5381 Other malaise: Secondary | ICD-10-CM | POA: Diagnosis not present

## 2019-03-01 DIAGNOSIS — U071 COVID-19: Secondary | ICD-10-CM | POA: Diagnosis not present

## 2019-03-01 DIAGNOSIS — I0981 Rheumatic heart failure: Secondary | ICD-10-CM | POA: Diagnosis not present

## 2019-03-01 DIAGNOSIS — N185 Chronic kidney disease, stage 5: Secondary | ICD-10-CM | POA: Diagnosis not present

## 2019-03-01 DIAGNOSIS — I2510 Atherosclerotic heart disease of native coronary artery without angina pectoris: Secondary | ICD-10-CM | POA: Diagnosis not present

## 2019-03-01 DIAGNOSIS — I5023 Acute on chronic systolic (congestive) heart failure: Secondary | ICD-10-CM | POA: Diagnosis not present

## 2019-03-01 DIAGNOSIS — I132 Hypertensive heart and chronic kidney disease with heart failure and with stage 5 chronic kidney disease, or end stage renal disease: Secondary | ICD-10-CM | POA: Diagnosis not present

## 2019-03-01 DIAGNOSIS — I051 Rheumatic mitral insufficiency: Secondary | ICD-10-CM | POA: Diagnosis not present

## 2019-03-02 DIAGNOSIS — I2510 Atherosclerotic heart disease of native coronary artery without angina pectoris: Secondary | ICD-10-CM | POA: Diagnosis not present

## 2019-03-02 DIAGNOSIS — I051 Rheumatic mitral insufficiency: Secondary | ICD-10-CM | POA: Diagnosis not present

## 2019-03-02 DIAGNOSIS — I132 Hypertensive heart and chronic kidney disease with heart failure and with stage 5 chronic kidney disease, or end stage renal disease: Secondary | ICD-10-CM | POA: Diagnosis not present

## 2019-03-02 DIAGNOSIS — I5023 Acute on chronic systolic (congestive) heart failure: Secondary | ICD-10-CM | POA: Diagnosis not present

## 2019-03-02 DIAGNOSIS — N185 Chronic kidney disease, stage 5: Secondary | ICD-10-CM | POA: Diagnosis not present

## 2019-03-02 DIAGNOSIS — I0981 Rheumatic heart failure: Secondary | ICD-10-CM | POA: Diagnosis not present

## 2019-03-03 DIAGNOSIS — D631 Anemia in chronic kidney disease: Secondary | ICD-10-CM | POA: Diagnosis not present

## 2019-03-03 DIAGNOSIS — D509 Iron deficiency anemia, unspecified: Secondary | ICD-10-CM | POA: Diagnosis not present

## 2019-03-03 DIAGNOSIS — N186 End stage renal disease: Secondary | ICD-10-CM | POA: Diagnosis not present

## 2019-03-03 DIAGNOSIS — Z992 Dependence on renal dialysis: Secondary | ICD-10-CM | POA: Diagnosis not present

## 2019-03-03 DIAGNOSIS — E46 Unspecified protein-calorie malnutrition: Secondary | ICD-10-CM | POA: Diagnosis not present

## 2019-03-03 DIAGNOSIS — D500 Iron deficiency anemia secondary to blood loss (chronic): Secondary | ICD-10-CM | POA: Diagnosis not present

## 2019-03-04 DIAGNOSIS — I0981 Rheumatic heart failure: Secondary | ICD-10-CM | POA: Diagnosis not present

## 2019-03-04 DIAGNOSIS — I2510 Atherosclerotic heart disease of native coronary artery without angina pectoris: Secondary | ICD-10-CM | POA: Diagnosis not present

## 2019-03-04 DIAGNOSIS — I5023 Acute on chronic systolic (congestive) heart failure: Secondary | ICD-10-CM | POA: Diagnosis not present

## 2019-03-04 DIAGNOSIS — I132 Hypertensive heart and chronic kidney disease with heart failure and with stage 5 chronic kidney disease, or end stage renal disease: Secondary | ICD-10-CM | POA: Diagnosis not present

## 2019-03-04 DIAGNOSIS — I051 Rheumatic mitral insufficiency: Secondary | ICD-10-CM | POA: Diagnosis not present

## 2019-03-04 DIAGNOSIS — N185 Chronic kidney disease, stage 5: Secondary | ICD-10-CM | POA: Diagnosis not present

## 2019-03-06 DIAGNOSIS — Z23 Encounter for immunization: Secondary | ICD-10-CM | POA: Diagnosis not present

## 2019-03-06 DIAGNOSIS — D696 Thrombocytopenia, unspecified: Secondary | ICD-10-CM | POA: Diagnosis not present

## 2019-03-06 DIAGNOSIS — E46 Unspecified protein-calorie malnutrition: Secondary | ICD-10-CM | POA: Diagnosis not present

## 2019-03-06 DIAGNOSIS — N2581 Secondary hyperparathyroidism of renal origin: Secondary | ICD-10-CM | POA: Diagnosis not present

## 2019-03-06 DIAGNOSIS — E878 Other disorders of electrolyte and fluid balance, not elsewhere classified: Secondary | ICD-10-CM | POA: Diagnosis not present

## 2019-03-06 DIAGNOSIS — N186 End stage renal disease: Secondary | ICD-10-CM | POA: Diagnosis not present

## 2019-03-06 DIAGNOSIS — Z992 Dependence on renal dialysis: Secondary | ICD-10-CM | POA: Diagnosis not present

## 2019-03-06 DIAGNOSIS — D500 Iron deficiency anemia secondary to blood loss (chronic): Secondary | ICD-10-CM | POA: Diagnosis not present

## 2019-03-06 DIAGNOSIS — D509 Iron deficiency anemia, unspecified: Secondary | ICD-10-CM | POA: Diagnosis not present

## 2019-03-06 DIAGNOSIS — D631 Anemia in chronic kidney disease: Secondary | ICD-10-CM | POA: Diagnosis not present

## 2019-03-08 DIAGNOSIS — D509 Iron deficiency anemia, unspecified: Secondary | ICD-10-CM | POA: Diagnosis not present

## 2019-03-08 DIAGNOSIS — D631 Anemia in chronic kidney disease: Secondary | ICD-10-CM | POA: Diagnosis not present

## 2019-03-08 DIAGNOSIS — D500 Iron deficiency anemia secondary to blood loss (chronic): Secondary | ICD-10-CM | POA: Diagnosis not present

## 2019-03-08 DIAGNOSIS — Z992 Dependence on renal dialysis: Secondary | ICD-10-CM | POA: Diagnosis not present

## 2019-03-08 DIAGNOSIS — N186 End stage renal disease: Secondary | ICD-10-CM | POA: Diagnosis not present

## 2019-03-08 DIAGNOSIS — N2581 Secondary hyperparathyroidism of renal origin: Secondary | ICD-10-CM | POA: Diagnosis not present

## 2019-03-09 DIAGNOSIS — I132 Hypertensive heart and chronic kidney disease with heart failure and with stage 5 chronic kidney disease, or end stage renal disease: Secondary | ICD-10-CM | POA: Diagnosis not present

## 2019-03-09 DIAGNOSIS — N185 Chronic kidney disease, stage 5: Secondary | ICD-10-CM | POA: Diagnosis not present

## 2019-03-09 DIAGNOSIS — I0981 Rheumatic heart failure: Secondary | ICD-10-CM | POA: Diagnosis not present

## 2019-03-09 DIAGNOSIS — I2510 Atherosclerotic heart disease of native coronary artery without angina pectoris: Secondary | ICD-10-CM | POA: Diagnosis not present

## 2019-03-09 DIAGNOSIS — I051 Rheumatic mitral insufficiency: Secondary | ICD-10-CM | POA: Diagnosis not present

## 2019-03-09 DIAGNOSIS — I5023 Acute on chronic systolic (congestive) heart failure: Secondary | ICD-10-CM | POA: Diagnosis not present

## 2019-03-10 DIAGNOSIS — D631 Anemia in chronic kidney disease: Secondary | ICD-10-CM | POA: Diagnosis not present

## 2019-03-10 DIAGNOSIS — Z992 Dependence on renal dialysis: Secondary | ICD-10-CM | POA: Diagnosis not present

## 2019-03-10 DIAGNOSIS — D500 Iron deficiency anemia secondary to blood loss (chronic): Secondary | ICD-10-CM | POA: Diagnosis not present

## 2019-03-10 DIAGNOSIS — D509 Iron deficiency anemia, unspecified: Secondary | ICD-10-CM | POA: Diagnosis not present

## 2019-03-10 DIAGNOSIS — N2581 Secondary hyperparathyroidism of renal origin: Secondary | ICD-10-CM | POA: Diagnosis not present

## 2019-03-10 DIAGNOSIS — N186 End stage renal disease: Secondary | ICD-10-CM | POA: Diagnosis not present

## 2019-03-11 DIAGNOSIS — Z87891 Personal history of nicotine dependence: Secondary | ICD-10-CM | POA: Diagnosis not present

## 2019-03-11 DIAGNOSIS — I447 Left bundle-branch block, unspecified: Secondary | ICD-10-CM | POA: Diagnosis not present

## 2019-03-11 DIAGNOSIS — Z950 Presence of cardiac pacemaker: Secondary | ICD-10-CM | POA: Diagnosis not present

## 2019-03-11 DIAGNOSIS — I2510 Atherosclerotic heart disease of native coronary artery without angina pectoris: Secondary | ICD-10-CM | POA: Diagnosis not present

## 2019-03-11 DIAGNOSIS — E039 Hypothyroidism, unspecified: Secondary | ICD-10-CM | POA: Diagnosis not present

## 2019-03-11 DIAGNOSIS — S51811D Laceration without foreign body of right forearm, subsequent encounter: Secondary | ICD-10-CM | POA: Diagnosis not present

## 2019-03-11 DIAGNOSIS — Z7901 Long term (current) use of anticoagulants: Secondary | ICD-10-CM | POA: Diagnosis not present

## 2019-03-11 DIAGNOSIS — M150 Primary generalized (osteo)arthritis: Secondary | ICD-10-CM | POA: Diagnosis not present

## 2019-03-11 DIAGNOSIS — Z9981 Dependence on supplemental oxygen: Secondary | ICD-10-CM | POA: Diagnosis not present

## 2019-03-11 DIAGNOSIS — I0981 Rheumatic heart failure: Secondary | ICD-10-CM | POA: Diagnosis not present

## 2019-03-11 DIAGNOSIS — I051 Rheumatic mitral insufficiency: Secondary | ICD-10-CM | POA: Diagnosis not present

## 2019-03-11 DIAGNOSIS — I482 Chronic atrial fibrillation, unspecified: Secondary | ICD-10-CM | POA: Diagnosis not present

## 2019-03-11 DIAGNOSIS — D509 Iron deficiency anemia, unspecified: Secondary | ICD-10-CM | POA: Diagnosis not present

## 2019-03-11 DIAGNOSIS — L89312 Pressure ulcer of right buttock, stage 2: Secondary | ICD-10-CM | POA: Diagnosis not present

## 2019-03-11 DIAGNOSIS — I132 Hypertensive heart and chronic kidney disease with heart failure and with stage 5 chronic kidney disease, or end stage renal disease: Secondary | ICD-10-CM | POA: Diagnosis not present

## 2019-03-11 DIAGNOSIS — E669 Obesity, unspecified: Secondary | ICD-10-CM | POA: Diagnosis not present

## 2019-03-11 DIAGNOSIS — N185 Chronic kidney disease, stage 5: Secondary | ICD-10-CM | POA: Diagnosis not present

## 2019-03-11 DIAGNOSIS — I701 Atherosclerosis of renal artery: Secondary | ICD-10-CM | POA: Diagnosis not present

## 2019-03-11 DIAGNOSIS — D638 Anemia in other chronic diseases classified elsewhere: Secondary | ICD-10-CM | POA: Diagnosis not present

## 2019-03-11 DIAGNOSIS — G4733 Obstructive sleep apnea (adult) (pediatric): Secondary | ICD-10-CM | POA: Diagnosis not present

## 2019-03-11 DIAGNOSIS — Z6836 Body mass index (BMI) 36.0-36.9, adult: Secondary | ICD-10-CM | POA: Diagnosis not present

## 2019-03-11 DIAGNOSIS — I5023 Acute on chronic systolic (congestive) heart failure: Secondary | ICD-10-CM | POA: Diagnosis not present

## 2019-03-11 DIAGNOSIS — Z992 Dependence on renal dialysis: Secondary | ICD-10-CM | POA: Diagnosis not present

## 2019-03-12 ENCOUNTER — Encounter: Payer: Medicare Other | Admitting: Family

## 2019-03-12 ENCOUNTER — Encounter (HOSPITAL_COMMUNITY): Payer: Medicare Other

## 2019-03-15 DIAGNOSIS — N186 End stage renal disease: Secondary | ICD-10-CM | POA: Diagnosis not present

## 2019-03-15 DIAGNOSIS — N2581 Secondary hyperparathyroidism of renal origin: Secondary | ICD-10-CM | POA: Diagnosis not present

## 2019-03-15 DIAGNOSIS — Z992 Dependence on renal dialysis: Secondary | ICD-10-CM | POA: Diagnosis not present

## 2019-03-15 DIAGNOSIS — D509 Iron deficiency anemia, unspecified: Secondary | ICD-10-CM | POA: Diagnosis not present

## 2019-03-15 DIAGNOSIS — D631 Anemia in chronic kidney disease: Secondary | ICD-10-CM | POA: Diagnosis not present

## 2019-03-15 DIAGNOSIS — D500 Iron deficiency anemia secondary to blood loss (chronic): Secondary | ICD-10-CM | POA: Diagnosis not present

## 2019-03-16 DIAGNOSIS — I0981 Rheumatic heart failure: Secondary | ICD-10-CM | POA: Diagnosis not present

## 2019-03-16 DIAGNOSIS — N185 Chronic kidney disease, stage 5: Secondary | ICD-10-CM | POA: Diagnosis not present

## 2019-03-16 DIAGNOSIS — I132 Hypertensive heart and chronic kidney disease with heart failure and with stage 5 chronic kidney disease, or end stage renal disease: Secondary | ICD-10-CM | POA: Diagnosis not present

## 2019-03-16 DIAGNOSIS — I2510 Atherosclerotic heart disease of native coronary artery without angina pectoris: Secondary | ICD-10-CM | POA: Diagnosis not present

## 2019-03-16 DIAGNOSIS — I051 Rheumatic mitral insufficiency: Secondary | ICD-10-CM | POA: Diagnosis not present

## 2019-03-16 DIAGNOSIS — I5023 Acute on chronic systolic (congestive) heart failure: Secondary | ICD-10-CM | POA: Diagnosis not present

## 2019-03-17 DIAGNOSIS — N185 Chronic kidney disease, stage 5: Secondary | ICD-10-CM | POA: Diagnosis not present

## 2019-03-17 DIAGNOSIS — I0981 Rheumatic heart failure: Secondary | ICD-10-CM | POA: Diagnosis not present

## 2019-03-17 DIAGNOSIS — I5023 Acute on chronic systolic (congestive) heart failure: Secondary | ICD-10-CM | POA: Diagnosis not present

## 2019-03-17 DIAGNOSIS — I132 Hypertensive heart and chronic kidney disease with heart failure and with stage 5 chronic kidney disease, or end stage renal disease: Secondary | ICD-10-CM | POA: Diagnosis not present

## 2019-03-17 DIAGNOSIS — I051 Rheumatic mitral insufficiency: Secondary | ICD-10-CM | POA: Diagnosis not present

## 2019-03-17 DIAGNOSIS — I2510 Atherosclerotic heart disease of native coronary artery without angina pectoris: Secondary | ICD-10-CM | POA: Diagnosis not present

## 2019-03-18 DIAGNOSIS — N186 End stage renal disease: Secondary | ICD-10-CM | POA: Diagnosis not present

## 2019-03-18 DIAGNOSIS — Z992 Dependence on renal dialysis: Secondary | ICD-10-CM | POA: Diagnosis not present

## 2019-03-18 DIAGNOSIS — D500 Iron deficiency anemia secondary to blood loss (chronic): Secondary | ICD-10-CM | POA: Diagnosis not present

## 2019-03-18 DIAGNOSIS — N2581 Secondary hyperparathyroidism of renal origin: Secondary | ICD-10-CM | POA: Diagnosis not present

## 2019-03-18 DIAGNOSIS — D631 Anemia in chronic kidney disease: Secondary | ICD-10-CM | POA: Diagnosis not present

## 2019-03-18 DIAGNOSIS — D509 Iron deficiency anemia, unspecified: Secondary | ICD-10-CM | POA: Diagnosis not present

## 2019-03-19 ENCOUNTER — Ambulatory Visit (INDEPENDENT_AMBULATORY_CARE_PROVIDER_SITE_OTHER): Payer: Medicare Other | Admitting: *Deleted

## 2019-03-19 ENCOUNTER — Encounter (HOSPITAL_COMMUNITY): Payer: Medicare Other

## 2019-03-19 DIAGNOSIS — I2510 Atherosclerotic heart disease of native coronary artery without angina pectoris: Secondary | ICD-10-CM | POA: Diagnosis not present

## 2019-03-19 DIAGNOSIS — I428 Other cardiomyopathies: Secondary | ICD-10-CM

## 2019-03-19 DIAGNOSIS — I051 Rheumatic mitral insufficiency: Secondary | ICD-10-CM | POA: Diagnosis not present

## 2019-03-19 DIAGNOSIS — I5023 Acute on chronic systolic (congestive) heart failure: Secondary | ICD-10-CM | POA: Diagnosis not present

## 2019-03-19 DIAGNOSIS — I132 Hypertensive heart and chronic kidney disease with heart failure and with stage 5 chronic kidney disease, or end stage renal disease: Secondary | ICD-10-CM | POA: Diagnosis not present

## 2019-03-19 DIAGNOSIS — N185 Chronic kidney disease, stage 5: Secondary | ICD-10-CM | POA: Diagnosis not present

## 2019-03-19 DIAGNOSIS — I0981 Rheumatic heart failure: Secondary | ICD-10-CM | POA: Diagnosis not present

## 2019-03-19 LAB — CUP PACEART REMOTE DEVICE CHECK
Battery Remaining Longevity: 96 mo
Battery Remaining Percentage: 95.5 %
Battery Voltage: 2.99 V
Brady Statistic AP VP Percent: 99 %
Brady Statistic AP VS Percent: 1 %
Brady Statistic AS VP Percent: 1 %
Brady Statistic AS VS Percent: 0 %
Brady Statistic RA Percent Paced: 23 %
Brady Statistic RV Percent Paced: 99 %
Date Time Interrogation Session: 20210115020012
Implantable Lead Implant Date: 20190719
Implantable Lead Implant Date: 20190719
Implantable Lead Location: 753859
Implantable Lead Location: 753860
Implantable Lead Model: 5076
Implantable Lead Model: 5076
Implantable Pulse Generator Implant Date: 20190719
Lead Channel Impedance Value: 410 Ohm
Lead Channel Impedance Value: 490 Ohm
Lead Channel Pacing Threshold Amplitude: 0.5 V
Lead Channel Pacing Threshold Amplitude: 1.5 V
Lead Channel Pacing Threshold Pulse Width: 0.4 ms
Lead Channel Pacing Threshold Pulse Width: 0.5 ms
Lead Channel Sensing Intrinsic Amplitude: 1.1 mV
Lead Channel Sensing Intrinsic Amplitude: 12 mV
Lead Channel Setting Pacing Amplitude: 2.5 V
Lead Channel Setting Pacing Amplitude: 2.5 V
Lead Channel Setting Pacing Pulse Width: 0.5 ms
Lead Channel Setting Sensing Sensitivity: 4 mV
Pulse Gen Model: 3222
Pulse Gen Serial Number: 9022392

## 2019-03-19 NOTE — Progress Notes (Signed)
PPM Remote  

## 2019-03-20 DIAGNOSIS — N186 End stage renal disease: Secondary | ICD-10-CM | POA: Diagnosis not present

## 2019-03-20 DIAGNOSIS — D500 Iron deficiency anemia secondary to blood loss (chronic): Secondary | ICD-10-CM | POA: Diagnosis not present

## 2019-03-20 DIAGNOSIS — Z992 Dependence on renal dialysis: Secondary | ICD-10-CM | POA: Diagnosis not present

## 2019-03-20 DIAGNOSIS — D509 Iron deficiency anemia, unspecified: Secondary | ICD-10-CM | POA: Diagnosis not present

## 2019-03-20 DIAGNOSIS — N2581 Secondary hyperparathyroidism of renal origin: Secondary | ICD-10-CM | POA: Diagnosis not present

## 2019-03-20 DIAGNOSIS — D631 Anemia in chronic kidney disease: Secondary | ICD-10-CM | POA: Diagnosis not present

## 2019-03-22 DIAGNOSIS — N185 Chronic kidney disease, stage 5: Secondary | ICD-10-CM | POA: Diagnosis not present

## 2019-03-22 DIAGNOSIS — I2510 Atherosclerotic heart disease of native coronary artery without angina pectoris: Secondary | ICD-10-CM | POA: Diagnosis not present

## 2019-03-22 DIAGNOSIS — I051 Rheumatic mitral insufficiency: Secondary | ICD-10-CM | POA: Diagnosis not present

## 2019-03-22 DIAGNOSIS — I132 Hypertensive heart and chronic kidney disease with heart failure and with stage 5 chronic kidney disease, or end stage renal disease: Secondary | ICD-10-CM | POA: Diagnosis not present

## 2019-03-22 DIAGNOSIS — I0981 Rheumatic heart failure: Secondary | ICD-10-CM | POA: Diagnosis not present

## 2019-03-22 DIAGNOSIS — I5023 Acute on chronic systolic (congestive) heart failure: Secondary | ICD-10-CM | POA: Diagnosis not present

## 2019-03-23 ENCOUNTER — Other Ambulatory Visit (HOSPITAL_COMMUNITY): Payer: Self-pay

## 2019-03-23 DIAGNOSIS — I5023 Acute on chronic systolic (congestive) heart failure: Secondary | ICD-10-CM | POA: Diagnosis not present

## 2019-03-23 DIAGNOSIS — D631 Anemia in chronic kidney disease: Secondary | ICD-10-CM | POA: Diagnosis not present

## 2019-03-23 DIAGNOSIS — N185 Chronic kidney disease, stage 5: Secondary | ICD-10-CM | POA: Diagnosis not present

## 2019-03-23 DIAGNOSIS — I0981 Rheumatic heart failure: Secondary | ICD-10-CM | POA: Diagnosis not present

## 2019-03-23 DIAGNOSIS — D500 Iron deficiency anemia secondary to blood loss (chronic): Secondary | ICD-10-CM | POA: Diagnosis not present

## 2019-03-23 DIAGNOSIS — N2581 Secondary hyperparathyroidism of renal origin: Secondary | ICD-10-CM | POA: Diagnosis not present

## 2019-03-23 DIAGNOSIS — N186 End stage renal disease: Secondary | ICD-10-CM | POA: Diagnosis not present

## 2019-03-23 DIAGNOSIS — I132 Hypertensive heart and chronic kidney disease with heart failure and with stage 5 chronic kidney disease, or end stage renal disease: Secondary | ICD-10-CM | POA: Diagnosis not present

## 2019-03-23 DIAGNOSIS — D509 Iron deficiency anemia, unspecified: Secondary | ICD-10-CM | POA: Diagnosis not present

## 2019-03-23 DIAGNOSIS — I051 Rheumatic mitral insufficiency: Secondary | ICD-10-CM | POA: Diagnosis not present

## 2019-03-23 DIAGNOSIS — I2510 Atherosclerotic heart disease of native coronary artery without angina pectoris: Secondary | ICD-10-CM | POA: Diagnosis not present

## 2019-03-23 DIAGNOSIS — Z992 Dependence on renal dialysis: Secondary | ICD-10-CM | POA: Diagnosis not present

## 2019-03-23 MED ORDER — APIXABAN 5 MG PO TABS: 5.0000 mg | ORAL_TABLET | Freq: Two times a day (BID) | ORAL | 3 refills | Status: DC

## 2019-03-24 DIAGNOSIS — I0981 Rheumatic heart failure: Secondary | ICD-10-CM | POA: Diagnosis not present

## 2019-03-24 DIAGNOSIS — I5023 Acute on chronic systolic (congestive) heart failure: Secondary | ICD-10-CM | POA: Diagnosis not present

## 2019-03-24 DIAGNOSIS — I051 Rheumatic mitral insufficiency: Secondary | ICD-10-CM | POA: Diagnosis not present

## 2019-03-24 DIAGNOSIS — I132 Hypertensive heart and chronic kidney disease with heart failure and with stage 5 chronic kidney disease, or end stage renal disease: Secondary | ICD-10-CM | POA: Diagnosis not present

## 2019-03-24 DIAGNOSIS — N185 Chronic kidney disease, stage 5: Secondary | ICD-10-CM | POA: Diagnosis not present

## 2019-03-24 DIAGNOSIS — I2510 Atherosclerotic heart disease of native coronary artery without angina pectoris: Secondary | ICD-10-CM | POA: Diagnosis not present

## 2019-03-25 DIAGNOSIS — I132 Hypertensive heart and chronic kidney disease with heart failure and with stage 5 chronic kidney disease, or end stage renal disease: Secondary | ICD-10-CM | POA: Diagnosis not present

## 2019-03-25 DIAGNOSIS — N185 Chronic kidney disease, stage 5: Secondary | ICD-10-CM | POA: Diagnosis not present

## 2019-03-25 DIAGNOSIS — N186 End stage renal disease: Secondary | ICD-10-CM | POA: Diagnosis not present

## 2019-03-25 DIAGNOSIS — D500 Iron deficiency anemia secondary to blood loss (chronic): Secondary | ICD-10-CM | POA: Diagnosis not present

## 2019-03-25 DIAGNOSIS — I5023 Acute on chronic systolic (congestive) heart failure: Secondary | ICD-10-CM | POA: Diagnosis not present

## 2019-03-25 DIAGNOSIS — I051 Rheumatic mitral insufficiency: Secondary | ICD-10-CM | POA: Diagnosis not present

## 2019-03-25 DIAGNOSIS — I2510 Atherosclerotic heart disease of native coronary artery without angina pectoris: Secondary | ICD-10-CM | POA: Diagnosis not present

## 2019-03-25 DIAGNOSIS — I0981 Rheumatic heart failure: Secondary | ICD-10-CM | POA: Diagnosis not present

## 2019-03-25 DIAGNOSIS — D631 Anemia in chronic kidney disease: Secondary | ICD-10-CM | POA: Diagnosis not present

## 2019-03-25 DIAGNOSIS — D509 Iron deficiency anemia, unspecified: Secondary | ICD-10-CM | POA: Diagnosis not present

## 2019-03-25 DIAGNOSIS — N2581 Secondary hyperparathyroidism of renal origin: Secondary | ICD-10-CM | POA: Diagnosis not present

## 2019-03-25 DIAGNOSIS — Z992 Dependence on renal dialysis: Secondary | ICD-10-CM | POA: Diagnosis not present

## 2019-03-26 DIAGNOSIS — I0981 Rheumatic heart failure: Secondary | ICD-10-CM | POA: Diagnosis not present

## 2019-03-26 DIAGNOSIS — I132 Hypertensive heart and chronic kidney disease with heart failure and with stage 5 chronic kidney disease, or end stage renal disease: Secondary | ICD-10-CM | POA: Diagnosis not present

## 2019-03-26 DIAGNOSIS — I051 Rheumatic mitral insufficiency: Secondary | ICD-10-CM | POA: Diagnosis not present

## 2019-03-26 DIAGNOSIS — I2510 Atherosclerotic heart disease of native coronary artery without angina pectoris: Secondary | ICD-10-CM | POA: Diagnosis not present

## 2019-03-26 DIAGNOSIS — N185 Chronic kidney disease, stage 5: Secondary | ICD-10-CM | POA: Diagnosis not present

## 2019-03-26 DIAGNOSIS — I5023 Acute on chronic systolic (congestive) heart failure: Secondary | ICD-10-CM | POA: Diagnosis not present

## 2019-03-27 DIAGNOSIS — Z992 Dependence on renal dialysis: Secondary | ICD-10-CM | POA: Diagnosis not present

## 2019-03-27 DIAGNOSIS — D631 Anemia in chronic kidney disease: Secondary | ICD-10-CM | POA: Diagnosis not present

## 2019-03-27 DIAGNOSIS — D509 Iron deficiency anemia, unspecified: Secondary | ICD-10-CM | POA: Diagnosis not present

## 2019-03-27 DIAGNOSIS — N2581 Secondary hyperparathyroidism of renal origin: Secondary | ICD-10-CM | POA: Diagnosis not present

## 2019-03-27 DIAGNOSIS — N186 End stage renal disease: Secondary | ICD-10-CM | POA: Diagnosis not present

## 2019-03-27 DIAGNOSIS — D500 Iron deficiency anemia secondary to blood loss (chronic): Secondary | ICD-10-CM | POA: Diagnosis not present

## 2019-03-29 DIAGNOSIS — I2510 Atherosclerotic heart disease of native coronary artery without angina pectoris: Secondary | ICD-10-CM | POA: Diagnosis not present

## 2019-03-29 DIAGNOSIS — I5023 Acute on chronic systolic (congestive) heart failure: Secondary | ICD-10-CM | POA: Diagnosis not present

## 2019-03-29 DIAGNOSIS — I0981 Rheumatic heart failure: Secondary | ICD-10-CM | POA: Diagnosis not present

## 2019-03-29 DIAGNOSIS — I051 Rheumatic mitral insufficiency: Secondary | ICD-10-CM | POA: Diagnosis not present

## 2019-03-29 DIAGNOSIS — I132 Hypertensive heart and chronic kidney disease with heart failure and with stage 5 chronic kidney disease, or end stage renal disease: Secondary | ICD-10-CM | POA: Diagnosis not present

## 2019-03-29 DIAGNOSIS — N185 Chronic kidney disease, stage 5: Secondary | ICD-10-CM | POA: Diagnosis not present

## 2019-03-30 ENCOUNTER — Encounter: Payer: Medicare Other | Admitting: Internal Medicine

## 2019-03-30 DIAGNOSIS — I051 Rheumatic mitral insufficiency: Secondary | ICD-10-CM | POA: Diagnosis not present

## 2019-03-30 DIAGNOSIS — Z992 Dependence on renal dialysis: Secondary | ICD-10-CM | POA: Diagnosis not present

## 2019-03-30 DIAGNOSIS — I2510 Atherosclerotic heart disease of native coronary artery without angina pectoris: Secondary | ICD-10-CM | POA: Diagnosis not present

## 2019-03-30 DIAGNOSIS — N186 End stage renal disease: Secondary | ICD-10-CM | POA: Diagnosis not present

## 2019-03-30 DIAGNOSIS — N2581 Secondary hyperparathyroidism of renal origin: Secondary | ICD-10-CM | POA: Diagnosis not present

## 2019-03-30 DIAGNOSIS — I0981 Rheumatic heart failure: Secondary | ICD-10-CM | POA: Diagnosis not present

## 2019-03-30 DIAGNOSIS — D509 Iron deficiency anemia, unspecified: Secondary | ICD-10-CM | POA: Diagnosis not present

## 2019-03-30 DIAGNOSIS — I5023 Acute on chronic systolic (congestive) heart failure: Secondary | ICD-10-CM | POA: Diagnosis not present

## 2019-03-30 DIAGNOSIS — D631 Anemia in chronic kidney disease: Secondary | ICD-10-CM | POA: Diagnosis not present

## 2019-03-30 DIAGNOSIS — I132 Hypertensive heart and chronic kidney disease with heart failure and with stage 5 chronic kidney disease, or end stage renal disease: Secondary | ICD-10-CM | POA: Diagnosis not present

## 2019-03-30 DIAGNOSIS — D500 Iron deficiency anemia secondary to blood loss (chronic): Secondary | ICD-10-CM | POA: Diagnosis not present

## 2019-03-30 DIAGNOSIS — N185 Chronic kidney disease, stage 5: Secondary | ICD-10-CM | POA: Diagnosis not present

## 2019-03-31 DIAGNOSIS — I132 Hypertensive heart and chronic kidney disease with heart failure and with stage 5 chronic kidney disease, or end stage renal disease: Secondary | ICD-10-CM | POA: Diagnosis not present

## 2019-03-31 DIAGNOSIS — I5023 Acute on chronic systolic (congestive) heart failure: Secondary | ICD-10-CM | POA: Diagnosis not present

## 2019-03-31 DIAGNOSIS — N185 Chronic kidney disease, stage 5: Secondary | ICD-10-CM | POA: Diagnosis not present

## 2019-03-31 DIAGNOSIS — I0981 Rheumatic heart failure: Secondary | ICD-10-CM | POA: Diagnosis not present

## 2019-03-31 DIAGNOSIS — I2510 Atherosclerotic heart disease of native coronary artery without angina pectoris: Secondary | ICD-10-CM | POA: Diagnosis not present

## 2019-03-31 DIAGNOSIS — I051 Rheumatic mitral insufficiency: Secondary | ICD-10-CM | POA: Diagnosis not present

## 2019-04-01 ENCOUNTER — Encounter: Payer: Medicare Other | Admitting: Internal Medicine

## 2019-04-01 DIAGNOSIS — I0981 Rheumatic heart failure: Secondary | ICD-10-CM | POA: Diagnosis not present

## 2019-04-01 DIAGNOSIS — N2581 Secondary hyperparathyroidism of renal origin: Secondary | ICD-10-CM | POA: Diagnosis not present

## 2019-04-01 DIAGNOSIS — I132 Hypertensive heart and chronic kidney disease with heart failure and with stage 5 chronic kidney disease, or end stage renal disease: Secondary | ICD-10-CM | POA: Diagnosis not present

## 2019-04-01 DIAGNOSIS — N186 End stage renal disease: Secondary | ICD-10-CM | POA: Diagnosis not present

## 2019-04-01 DIAGNOSIS — D500 Iron deficiency anemia secondary to blood loss (chronic): Secondary | ICD-10-CM | POA: Diagnosis not present

## 2019-04-01 DIAGNOSIS — I5023 Acute on chronic systolic (congestive) heart failure: Secondary | ICD-10-CM | POA: Diagnosis not present

## 2019-04-01 DIAGNOSIS — D509 Iron deficiency anemia, unspecified: Secondary | ICD-10-CM | POA: Diagnosis not present

## 2019-04-01 DIAGNOSIS — Z992 Dependence on renal dialysis: Secondary | ICD-10-CM | POA: Diagnosis not present

## 2019-04-01 DIAGNOSIS — D631 Anemia in chronic kidney disease: Secondary | ICD-10-CM | POA: Diagnosis not present

## 2019-04-01 DIAGNOSIS — N185 Chronic kidney disease, stage 5: Secondary | ICD-10-CM | POA: Diagnosis not present

## 2019-04-01 DIAGNOSIS — I2510 Atherosclerotic heart disease of native coronary artery without angina pectoris: Secondary | ICD-10-CM | POA: Diagnosis not present

## 2019-04-01 DIAGNOSIS — I051 Rheumatic mitral insufficiency: Secondary | ICD-10-CM | POA: Diagnosis not present

## 2019-04-02 DIAGNOSIS — R609 Edema, unspecified: Secondary | ICD-10-CM | POA: Diagnosis not present

## 2019-04-02 DIAGNOSIS — I132 Hypertensive heart and chronic kidney disease with heart failure and with stage 5 chronic kidney disease, or end stage renal disease: Secondary | ICD-10-CM | POA: Diagnosis not present

## 2019-04-02 DIAGNOSIS — I429 Cardiomyopathy, unspecified: Secondary | ICD-10-CM | POA: Diagnosis not present

## 2019-04-02 DIAGNOSIS — I509 Heart failure, unspecified: Secondary | ICD-10-CM | POA: Diagnosis not present

## 2019-04-02 DIAGNOSIS — Z6836 Body mass index (BMI) 36.0-36.9, adult: Secondary | ICD-10-CM | POA: Diagnosis not present

## 2019-04-02 DIAGNOSIS — F419 Anxiety disorder, unspecified: Secondary | ICD-10-CM | POA: Diagnosis not present

## 2019-04-02 DIAGNOSIS — I2510 Atherosclerotic heart disease of native coronary artery without angina pectoris: Secondary | ICD-10-CM | POA: Diagnosis not present

## 2019-04-02 DIAGNOSIS — I5023 Acute on chronic systolic (congestive) heart failure: Secondary | ICD-10-CM | POA: Diagnosis not present

## 2019-04-02 DIAGNOSIS — I051 Rheumatic mitral insufficiency: Secondary | ICD-10-CM | POA: Diagnosis not present

## 2019-04-02 DIAGNOSIS — I0981 Rheumatic heart failure: Secondary | ICD-10-CM | POA: Diagnosis not present

## 2019-04-02 DIAGNOSIS — N185 Chronic kidney disease, stage 5: Secondary | ICD-10-CM | POA: Diagnosis not present

## 2019-04-02 DIAGNOSIS — Z299 Encounter for prophylactic measures, unspecified: Secondary | ICD-10-CM | POA: Diagnosis not present

## 2019-04-02 IMAGING — DX DG CHEST 1V PORT
1 series · 1 of 1 positions shown · non-contrast
Comparison: 09/09/2017

CLINICAL DATA: Shortness of breath

EXAM:
PORTABLE CHEST 1 VIEW

[chest]
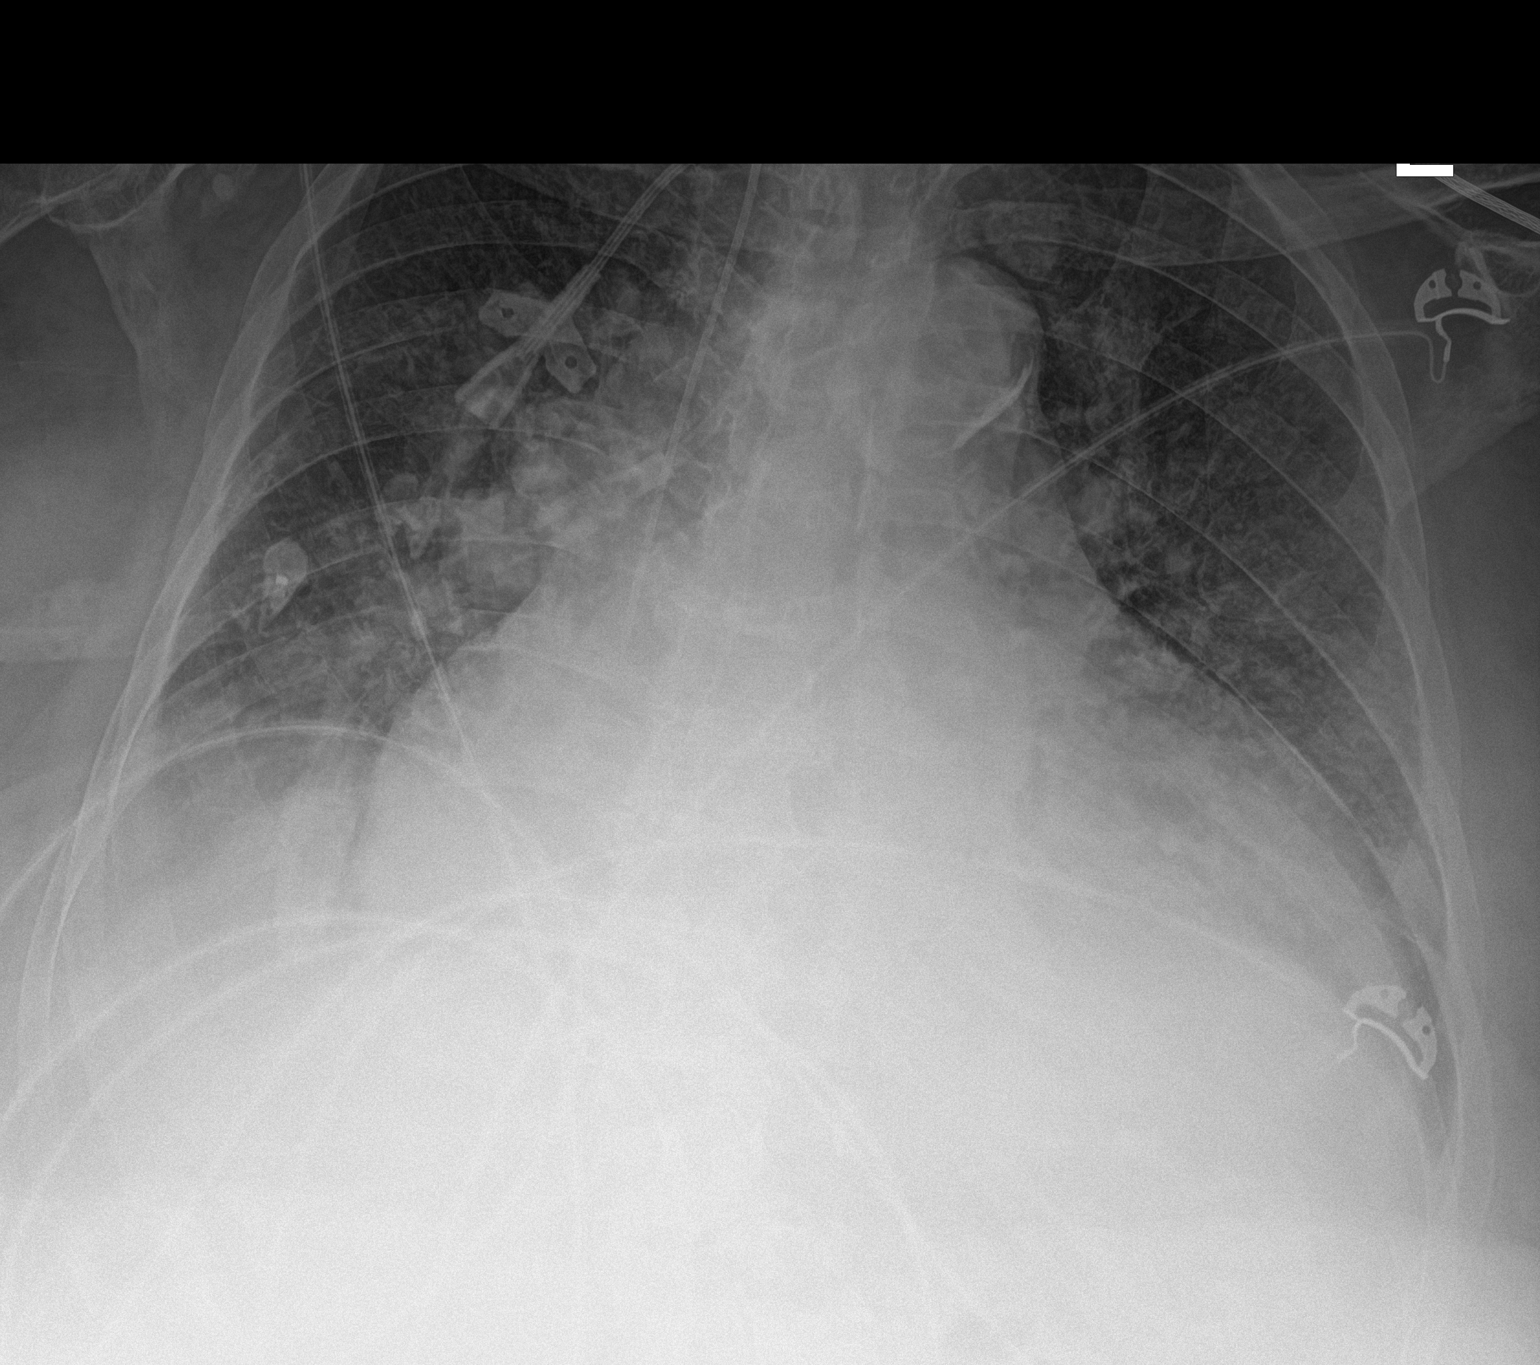

[1 of 1 positions shown; findings below may reference images not displayed]

FINDINGS: Prominent enlargement of the cardiopericardial silhouette with
bilateral indistinct interstitial accentuation and bibasilar
airspace opacities. The airspace opacities are increased from prior.

Atherosclerotic calcification of the aortic arch. Right IJ line tip:
SVC. No appreciable pneumothorax.
IMPRESSION: 1. Prominent enlargement of the cardiopericardial silhouette with
interstitial pulmonary edema.
2. Bibasilar airspace opacities possibly from layering effusions,
atelectasis, or bibasilar pneumonia.
3.  Aortic Atherosclerosis ([id number]).

## 2019-04-03 DIAGNOSIS — D631 Anemia in chronic kidney disease: Secondary | ICD-10-CM | POA: Diagnosis not present

## 2019-04-03 DIAGNOSIS — N186 End stage renal disease: Secondary | ICD-10-CM | POA: Diagnosis not present

## 2019-04-03 DIAGNOSIS — N2581 Secondary hyperparathyroidism of renal origin: Secondary | ICD-10-CM | POA: Diagnosis not present

## 2019-04-03 DIAGNOSIS — Z992 Dependence on renal dialysis: Secondary | ICD-10-CM | POA: Diagnosis not present

## 2019-04-03 DIAGNOSIS — D500 Iron deficiency anemia secondary to blood loss (chronic): Secondary | ICD-10-CM | POA: Diagnosis not present

## 2019-04-03 DIAGNOSIS — D509 Iron deficiency anemia, unspecified: Secondary | ICD-10-CM | POA: Diagnosis not present

## 2019-04-03 IMAGING — DX DG CHEST 1V PORT
1 series · 1 of 1 positions shown · non-contrast
Comparison: One-view chest x-ray 09/16/2017

CLINICAL DATA: Shortness of breath.  Respiratory failure.

EXAM:
PORTABLE CHEST 1 VIEW

[chest]
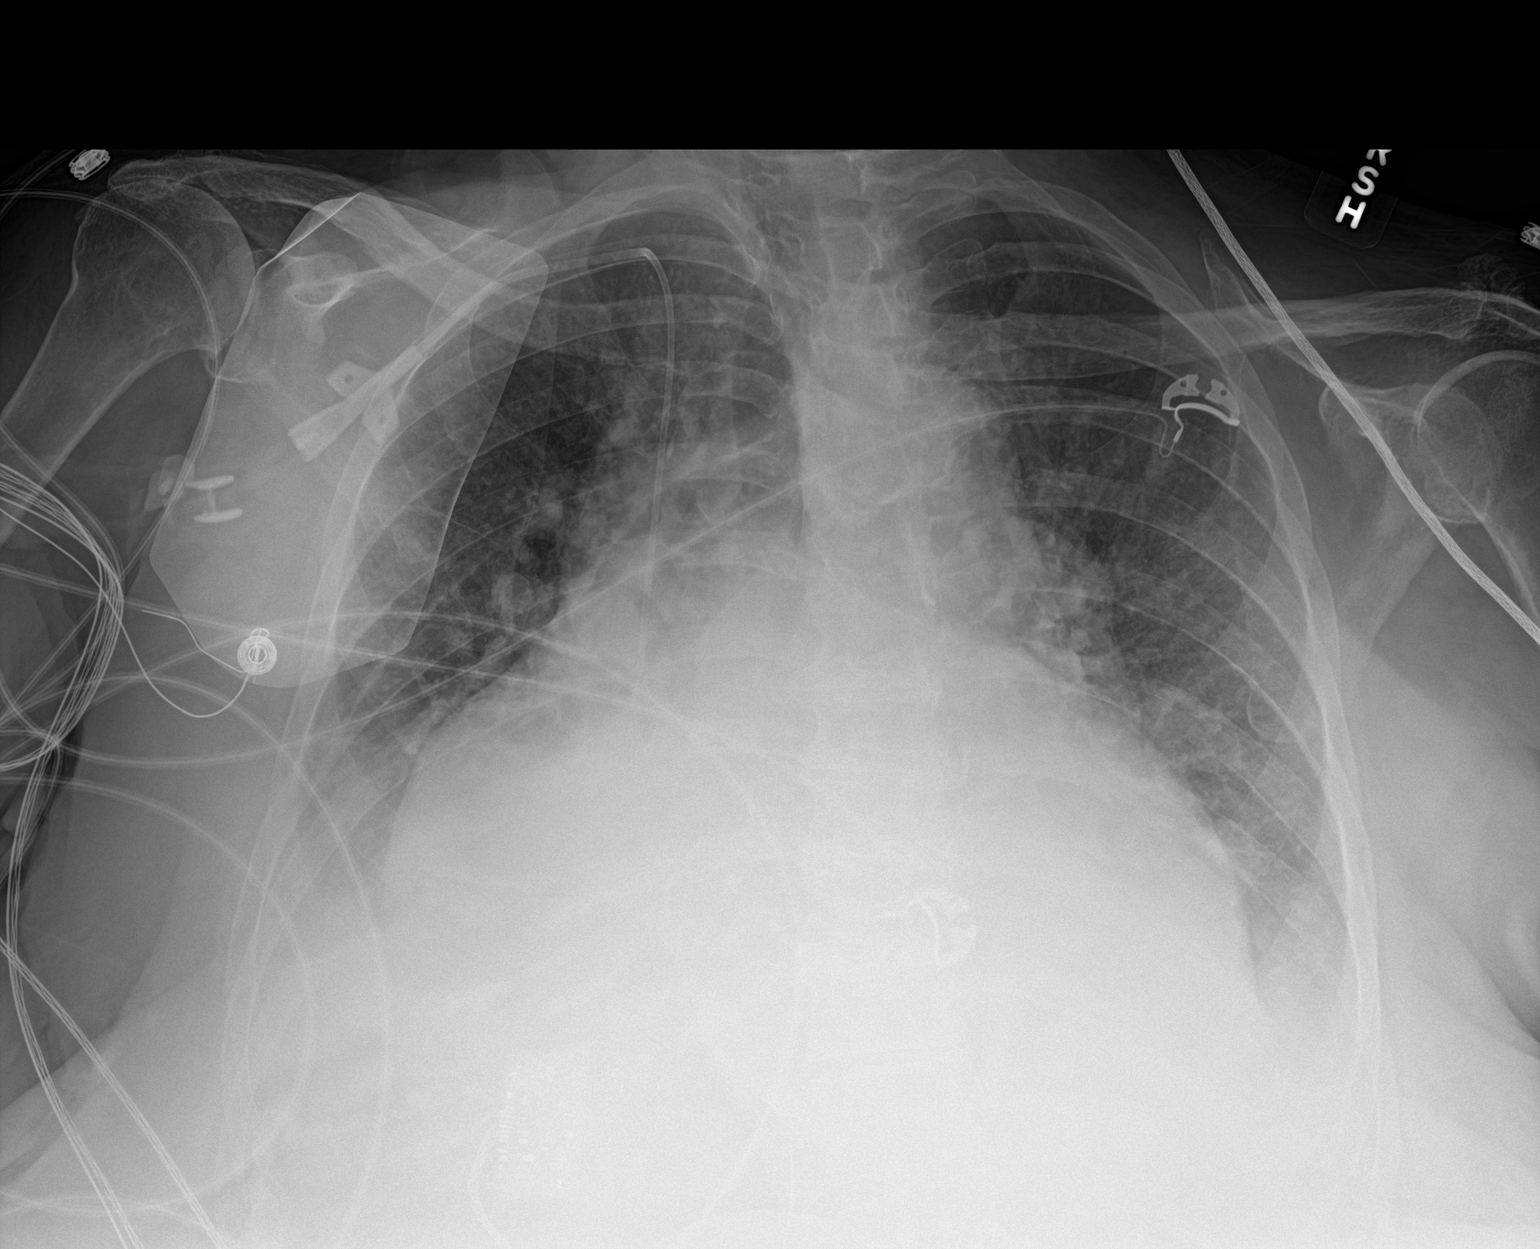

[1 of 1 positions shown; findings below may reference images not displayed]

FINDINGS: The heart is enlarged. Mild edema is stable. A right IJ line is
stable. Bilateral pleural effusions and airspace disease are
unchanged. Defibrillator pad is in place over the right chest wall.
IMPRESSION: 1. Stable cardiomegaly with interstitial edema and effusions
compatible with congestive heart failure.
2. Bibasilar airspace disease likely reflects atelectasis. Infection
is not excluded.
3. Right IJ line is stable.

## 2019-04-04 DIAGNOSIS — N186 End stage renal disease: Secondary | ICD-10-CM | POA: Diagnosis not present

## 2019-04-05 DIAGNOSIS — I051 Rheumatic mitral insufficiency: Secondary | ICD-10-CM | POA: Diagnosis not present

## 2019-04-05 DIAGNOSIS — I2510 Atherosclerotic heart disease of native coronary artery without angina pectoris: Secondary | ICD-10-CM | POA: Diagnosis not present

## 2019-04-05 DIAGNOSIS — I132 Hypertensive heart and chronic kidney disease with heart failure and with stage 5 chronic kidney disease, or end stage renal disease: Secondary | ICD-10-CM | POA: Diagnosis not present

## 2019-04-05 DIAGNOSIS — I5023 Acute on chronic systolic (congestive) heart failure: Secondary | ICD-10-CM | POA: Diagnosis not present

## 2019-04-05 DIAGNOSIS — N185 Chronic kidney disease, stage 5: Secondary | ICD-10-CM | POA: Diagnosis not present

## 2019-04-05 DIAGNOSIS — I0981 Rheumatic heart failure: Secondary | ICD-10-CM | POA: Diagnosis not present

## 2019-04-06 DIAGNOSIS — D696 Thrombocytopenia, unspecified: Secondary | ICD-10-CM | POA: Diagnosis not present

## 2019-04-06 DIAGNOSIS — D631 Anemia in chronic kidney disease: Secondary | ICD-10-CM | POA: Diagnosis not present

## 2019-04-06 DIAGNOSIS — E878 Other disorders of electrolyte and fluid balance, not elsewhere classified: Secondary | ICD-10-CM | POA: Diagnosis not present

## 2019-04-06 DIAGNOSIS — N186 End stage renal disease: Secondary | ICD-10-CM | POA: Diagnosis not present

## 2019-04-06 DIAGNOSIS — D509 Iron deficiency anemia, unspecified: Secondary | ICD-10-CM | POA: Diagnosis not present

## 2019-04-06 DIAGNOSIS — N2581 Secondary hyperparathyroidism of renal origin: Secondary | ICD-10-CM | POA: Diagnosis not present

## 2019-04-06 DIAGNOSIS — Z992 Dependence on renal dialysis: Secondary | ICD-10-CM | POA: Diagnosis not present

## 2019-04-06 DIAGNOSIS — E46 Unspecified protein-calorie malnutrition: Secondary | ICD-10-CM | POA: Diagnosis not present

## 2019-04-06 DIAGNOSIS — Z23 Encounter for immunization: Secondary | ICD-10-CM | POA: Diagnosis not present

## 2019-04-06 IMAGING — DX DG CHEST 1V PORT
1 series · 1 of 1 positions shown · non-contrast
Comparison: Chest x-ray dated September 17, 2017.

CLINICAL DATA: Pacemaker placement.

EXAM:
PORTABLE CHEST 1 VIEW

[chest]
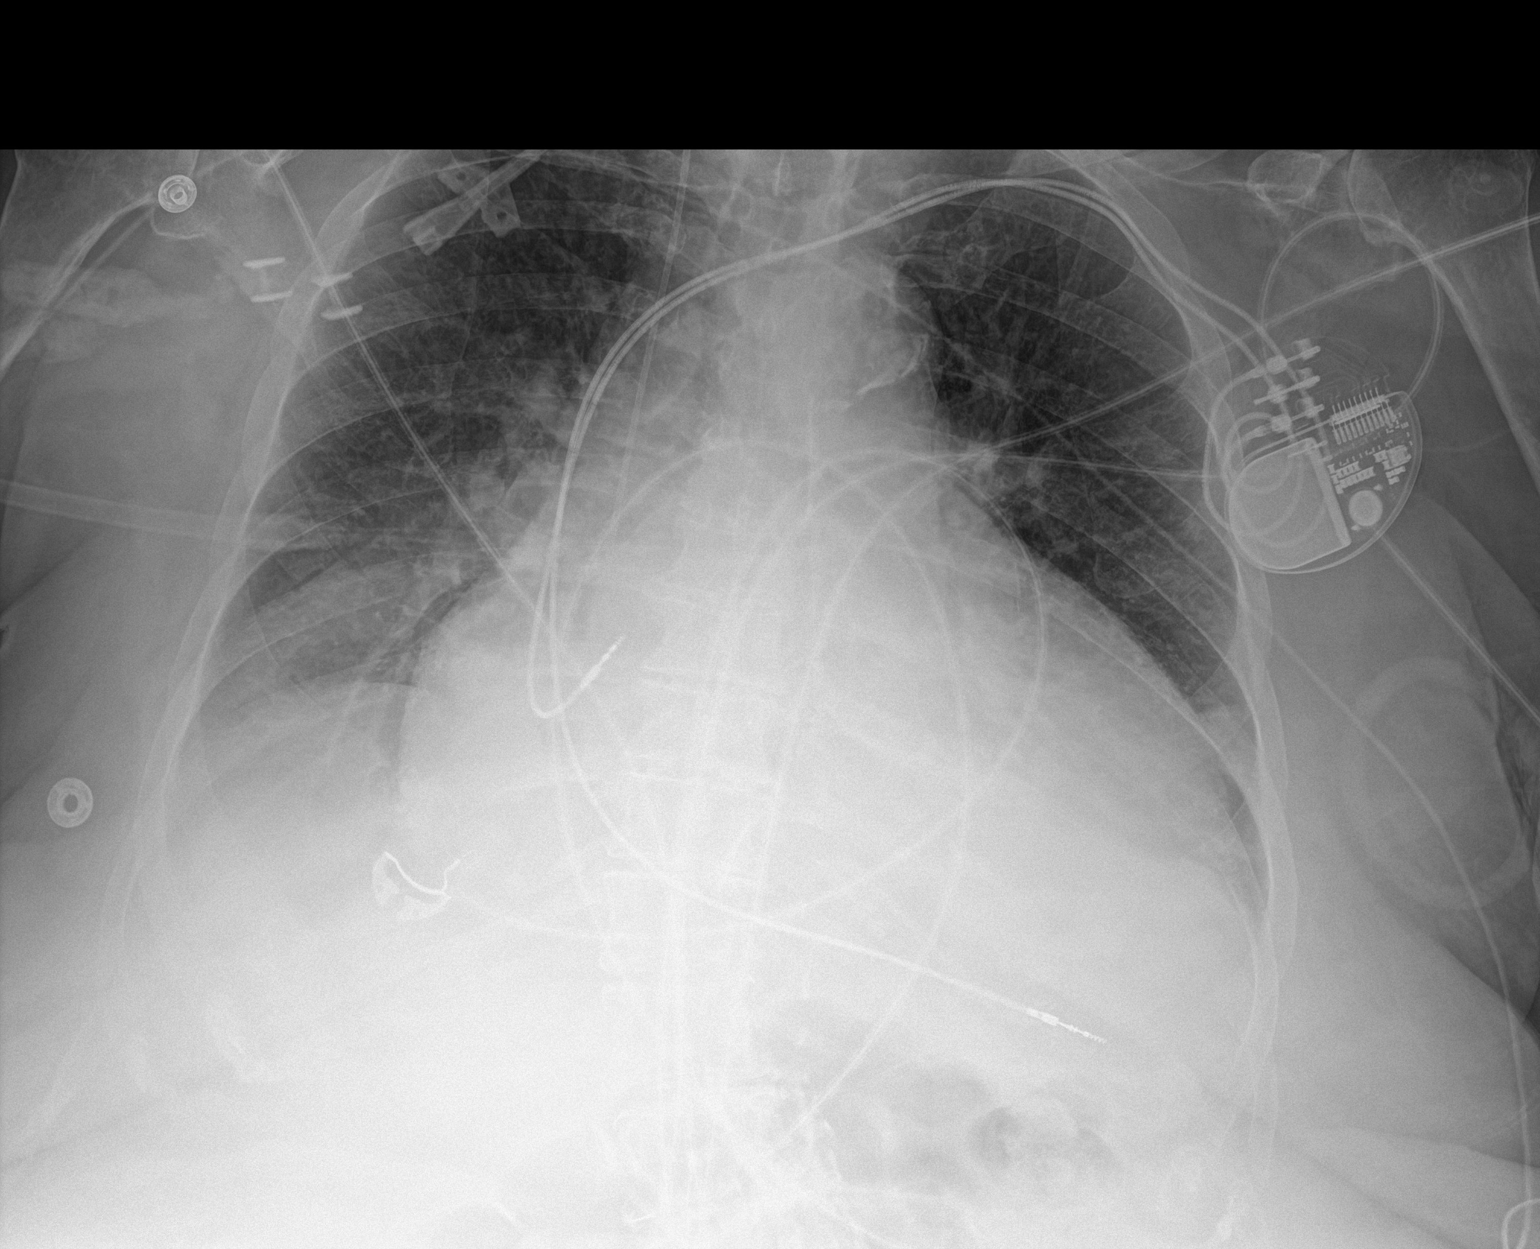

[1 of 1 positions shown; findings below may reference images not displayed]

FINDINGS: Interval placement of a left chest wall pacemaker with leads
terminating in the right atrium and right ventricle. Unchanged
tunneled right internal jugular central venous catheter with tip at
the cavoatrial junction. Stable moderate cardiomegaly with pulmonary
vascular congestion and unchanged bilateral hazy lower lung
opacities likely reflecting a combination of pleural fluid and
atelectasis. No pneumothorax. No acute osseous abnormality.
IMPRESSION: 1. Interval placement of a left chest wall pacemaker. No
pneumothorax.
2. Unchanged pulmonary vascular congestion and small bilateral
pleural effusions.

## 2019-04-07 DIAGNOSIS — I051 Rheumatic mitral insufficiency: Secondary | ICD-10-CM | POA: Diagnosis not present

## 2019-04-07 DIAGNOSIS — I132 Hypertensive heart and chronic kidney disease with heart failure and with stage 5 chronic kidney disease, or end stage renal disease: Secondary | ICD-10-CM | POA: Diagnosis not present

## 2019-04-07 DIAGNOSIS — I0981 Rheumatic heart failure: Secondary | ICD-10-CM | POA: Diagnosis not present

## 2019-04-07 DIAGNOSIS — I5023 Acute on chronic systolic (congestive) heart failure: Secondary | ICD-10-CM | POA: Diagnosis not present

## 2019-04-07 DIAGNOSIS — I2510 Atherosclerotic heart disease of native coronary artery without angina pectoris: Secondary | ICD-10-CM | POA: Diagnosis not present

## 2019-04-07 DIAGNOSIS — N185 Chronic kidney disease, stage 5: Secondary | ICD-10-CM | POA: Diagnosis not present

## 2019-04-08 ENCOUNTER — Other Ambulatory Visit: Payer: Self-pay

## 2019-04-08 ENCOUNTER — Telehealth (HOSPITAL_COMMUNITY): Payer: Self-pay

## 2019-04-08 DIAGNOSIS — N186 End stage renal disease: Secondary | ICD-10-CM | POA: Diagnosis not present

## 2019-04-08 DIAGNOSIS — D631 Anemia in chronic kidney disease: Secondary | ICD-10-CM | POA: Diagnosis not present

## 2019-04-08 DIAGNOSIS — Z992 Dependence on renal dialysis: Secondary | ICD-10-CM | POA: Diagnosis not present

## 2019-04-08 DIAGNOSIS — N2581 Secondary hyperparathyroidism of renal origin: Secondary | ICD-10-CM | POA: Diagnosis not present

## 2019-04-08 DIAGNOSIS — D509 Iron deficiency anemia, unspecified: Secondary | ICD-10-CM | POA: Diagnosis not present

## 2019-04-08 NOTE — Telephone Encounter (Signed)

## 2019-04-09 ENCOUNTER — Ambulatory Visit (HOSPITAL_COMMUNITY)
Admission: RE | Admit: 2019-04-09 | Discharge: 2019-04-09 | Disposition: A | Discharge status: Home / Self Care | Payer: Medicare Other | Source: Ambulatory Visit | Attending: Surgery | Admitting: Surgery

## 2019-04-09 ENCOUNTER — Other Ambulatory Visit: Payer: Self-pay

## 2019-04-09 ENCOUNTER — Ambulatory Visit (INDEPENDENT_AMBULATORY_CARE_PROVIDER_SITE_OTHER): Payer: Self-pay | Admitting: Physician Assistant

## 2019-04-09 ENCOUNTER — Encounter: Payer: Self-pay | Admitting: Physician Assistant

## 2019-04-09 VITALS — BP 116/76 | HR 72 | Temp 97.5°F | Resp 18 | Ht 64.0 in | Wt 206.0 lb

## 2019-04-09 DIAGNOSIS — N186 End stage renal disease: Secondary | ICD-10-CM

## 2019-04-09 NOTE — Progress Notes (Signed)
POST OPERATIVE OFFICE NOTE    CC:  F/u for surgery/ 9 weeks post-op  HPI:  This is a 79 y.o. female who is s/p right arm first stage basilic vein AV fistula creation on 02/04/2019 by Dr. Donzetta Matters.  She is accompanied by her niece on the cordless phone at the time of interview and examination.  She denies right hand pain, tingling or numbness.  She is dialyzing via right IJ tunneled dialysis catheter without complications  The patient was hospitalized in November 2020 with acute on chronic systolic congestive heart failure exacerbation.  History of stage V chronic kidney disease and required placement of tunneled dialysis catheter and initiation of hemodialysis on November 30.  Previous St Jude PPM implantation maintained on Eliquis.  HD clinic/treatment days: [facility]/ [place], [place]/Tuesdays Thursdays Saturdays   Allergies  Allergen Reactions  . Bactrim [Sulfamethoxazole-Trimethoprim] Other (See Comments)    Flu like symptoms  . Cephalexin Rash    REACTION: Rash to arms/legs    Current Outpatient Medications  Medication Sig Dispense Refill  . acetaminophen (TYLENOL) 325 MG tablet Take 650 mg by mouth at bedtime as needed.    [name] amiodarone (PACERONE) 200 MG tablet Take 200 mg by mouth daily.    [name] apixaban (ELIQUIS) 5 MG TABS tablet Take 1 tablet (5 mg total) by mouth 2 (two) times daily. 60 tablet 3  . carvedilol (COREG) 12.5 MG tablet Take 1 tablet (12.5 mg total) by mouth 2 (two) times daily. 90 tablet 3  . ferrous sulfate 325 (65 FE) MG tablet Take 325 mg by mouth daily with breakfast.    . levothyroxine (SYNTHROID) 75 MCG tablet Take 75 mcg by mouth daily before breakfast.    . multivitamin (RENA-VIT) TABS tablet Take 1 tablet by mouth at bedtime. 30 tablet 0  . torsemide (DEMADEX) 20 MG tablet Take 1 tablet (20 mg total) by mouth 3 (three) times a week. Monday, Wednesday and Friday 15 tablet 5   No current facility-administered medications for this visit.     ROS:  See  HPI  Physical Exam: Vitals:   04/09/19 1528  Weight: 206 lb (93.4 kg)  Height: 5\' 4"  (1.626 m)     Incision: Well-healed Extremities: Extremity incision is well-healed.  Good bruit and thrill in fistula.  5/5 right grip strength.  2+ radial pulse Neuro: Alert and oriented x4  Findings:  +--------------------+----------+-----------------+--------+  AVF         PSV (cm/s)Flow Vol (mL/min)Comments  +--------------------+----------+-----------------+--------+  Native artery inflow  87      290          +--------------------+----------+-----------------+--------+  AVF Anastomosis     68                 +--------------------+----------+-----------------+--------+     +------------+----------+-------------+----------+------------------------+   OUTFLOW VEINPSV (cm/s)Diameter (cm)Depth (cm)    Describe        +------------+----------+-------------+----------+------------------------+   Prox UA     27    0.80     1.61                  +------------+----------+-------------+----------+------------------------+   MID UA     50    0.61     1.57                  +------------+----------+-------------+----------+------------------------+   Distal UA    47    0.64     0.91                  +------------+----------+-------------+----------+------------------------+  AC Fossa    914    0.43     0.49  narrowing approx 0.75  cm  +------------+----------+-------------+----------+------------------------+   AC Fossa    17    0.63     0.33                  +------------+----------+-------------+----------+------------------------+      Summary:  Patent right Basilic vein transposition with narrowing and increased  velocity in the antecubital fossa segment with decreased flow  proximally..    *See table(s) above for measurements and observations.    Diagnosing physician: Servando Snare MD  Electronically signed by Servando Snare MD on 04/09/2019 at 3:31:43 PM.     Assessment/Plan:  This is a 79 y.o. female who is s/p: right arm first stage basilic vein AV fistula creation on 02/04/2019  Her fistula is maturing.  We will plan on second stage procedure with Dr. Donzetta Matters.  The patient is maintained on Eliquis and this will be held prior to her procedure.  -Barbie Banner, PA-C [facility] [phone]  Clinic MD: Donzetta Matters

## 2019-04-10 DIAGNOSIS — I447 Left bundle-branch block, unspecified: Secondary | ICD-10-CM | POA: Diagnosis not present

## 2019-04-10 DIAGNOSIS — Z87891 Personal history of nicotine dependence: Secondary | ICD-10-CM | POA: Diagnosis not present

## 2019-04-10 DIAGNOSIS — S51821D Laceration with foreign body of right forearm, subsequent encounter: Secondary | ICD-10-CM | POA: Diagnosis not present

## 2019-04-10 DIAGNOSIS — M150 Primary generalized (osteo)arthritis: Secondary | ICD-10-CM | POA: Diagnosis not present

## 2019-04-10 DIAGNOSIS — Z992 Dependence on renal dialysis: Secondary | ICD-10-CM | POA: Diagnosis not present

## 2019-04-10 DIAGNOSIS — Z950 Presence of cardiac pacemaker: Secondary | ICD-10-CM | POA: Diagnosis not present

## 2019-04-10 DIAGNOSIS — I132 Hypertensive heart and chronic kidney disease with heart failure and with stage 5 chronic kidney disease, or end stage renal disease: Secondary | ICD-10-CM | POA: Diagnosis not present

## 2019-04-10 DIAGNOSIS — I2510 Atherosclerotic heart disease of native coronary artery without angina pectoris: Secondary | ICD-10-CM | POA: Diagnosis not present

## 2019-04-10 DIAGNOSIS — E039 Hypothyroidism, unspecified: Secondary | ICD-10-CM | POA: Diagnosis not present

## 2019-04-10 DIAGNOSIS — E669 Obesity, unspecified: Secondary | ICD-10-CM | POA: Diagnosis not present

## 2019-04-10 DIAGNOSIS — Z6836 Body mass index (BMI) 36.0-36.9, adult: Secondary | ICD-10-CM | POA: Diagnosis not present

## 2019-04-10 DIAGNOSIS — I701 Atherosclerosis of renal artery: Secondary | ICD-10-CM | POA: Diagnosis not present

## 2019-04-10 DIAGNOSIS — N186 End stage renal disease: Secondary | ICD-10-CM | POA: Diagnosis not present

## 2019-04-10 DIAGNOSIS — I482 Chronic atrial fibrillation, unspecified: Secondary | ICD-10-CM | POA: Diagnosis not present

## 2019-04-10 DIAGNOSIS — I051 Rheumatic mitral insufficiency: Secondary | ICD-10-CM | POA: Diagnosis not present

## 2019-04-10 DIAGNOSIS — N185 Chronic kidney disease, stage 5: Secondary | ICD-10-CM | POA: Diagnosis not present

## 2019-04-10 DIAGNOSIS — I0981 Rheumatic heart failure: Secondary | ICD-10-CM | POA: Diagnosis not present

## 2019-04-10 DIAGNOSIS — D509 Iron deficiency anemia, unspecified: Secondary | ICD-10-CM | POA: Diagnosis not present

## 2019-04-10 DIAGNOSIS — I5023 Acute on chronic systolic (congestive) heart failure: Secondary | ICD-10-CM | POA: Diagnosis not present

## 2019-04-10 DIAGNOSIS — S81802D Unspecified open wound, left lower leg, subsequent encounter: Secondary | ICD-10-CM | POA: Diagnosis not present

## 2019-04-10 DIAGNOSIS — Z9981 Dependence on supplemental oxygen: Secondary | ICD-10-CM | POA: Diagnosis not present

## 2019-04-10 DIAGNOSIS — D631 Anemia in chronic kidney disease: Secondary | ICD-10-CM | POA: Diagnosis not present

## 2019-04-10 DIAGNOSIS — D638 Anemia in other chronic diseases classified elsewhere: Secondary | ICD-10-CM | POA: Diagnosis not present

## 2019-04-10 DIAGNOSIS — Z7901 Long term (current) use of anticoagulants: Secondary | ICD-10-CM | POA: Diagnosis not present

## 2019-04-10 DIAGNOSIS — G4733 Obstructive sleep apnea (adult) (pediatric): Secondary | ICD-10-CM | POA: Diagnosis not present

## 2019-04-10 DIAGNOSIS — N2581 Secondary hyperparathyroidism of renal origin: Secondary | ICD-10-CM | POA: Diagnosis not present

## 2019-04-12 DIAGNOSIS — I051 Rheumatic mitral insufficiency: Secondary | ICD-10-CM | POA: Diagnosis not present

## 2019-04-12 DIAGNOSIS — I0981 Rheumatic heart failure: Secondary | ICD-10-CM | POA: Diagnosis not present

## 2019-04-12 DIAGNOSIS — I2510 Atherosclerotic heart disease of native coronary artery without angina pectoris: Secondary | ICD-10-CM | POA: Diagnosis not present

## 2019-04-12 DIAGNOSIS — I5023 Acute on chronic systolic (congestive) heart failure: Secondary | ICD-10-CM | POA: Diagnosis not present

## 2019-04-12 DIAGNOSIS — N185 Chronic kidney disease, stage 5: Secondary | ICD-10-CM | POA: Diagnosis not present

## 2019-04-12 DIAGNOSIS — I132 Hypertensive heart and chronic kidney disease with heart failure and with stage 5 chronic kidney disease, or end stage renal disease: Secondary | ICD-10-CM | POA: Diagnosis not present

## 2019-04-13 DIAGNOSIS — D631 Anemia in chronic kidney disease: Secondary | ICD-10-CM | POA: Diagnosis not present

## 2019-04-13 DIAGNOSIS — N2581 Secondary hyperparathyroidism of renal origin: Secondary | ICD-10-CM | POA: Diagnosis not present

## 2019-04-13 DIAGNOSIS — N186 End stage renal disease: Secondary | ICD-10-CM | POA: Diagnosis not present

## 2019-04-13 DIAGNOSIS — Z992 Dependence on renal dialysis: Secondary | ICD-10-CM | POA: Diagnosis not present

## 2019-04-13 DIAGNOSIS — D509 Iron deficiency anemia, unspecified: Secondary | ICD-10-CM | POA: Diagnosis not present

## 2019-04-14 DIAGNOSIS — I5023 Acute on chronic systolic (congestive) heart failure: Secondary | ICD-10-CM | POA: Diagnosis not present

## 2019-04-14 DIAGNOSIS — I0981 Rheumatic heart failure: Secondary | ICD-10-CM | POA: Diagnosis not present

## 2019-04-14 DIAGNOSIS — I2510 Atherosclerotic heart disease of native coronary artery without angina pectoris: Secondary | ICD-10-CM | POA: Diagnosis not present

## 2019-04-14 DIAGNOSIS — I051 Rheumatic mitral insufficiency: Secondary | ICD-10-CM | POA: Diagnosis not present

## 2019-04-14 DIAGNOSIS — I132 Hypertensive heart and chronic kidney disease with heart failure and with stage 5 chronic kidney disease, or end stage renal disease: Secondary | ICD-10-CM | POA: Diagnosis not present

## 2019-04-14 DIAGNOSIS — N185 Chronic kidney disease, stage 5: Secondary | ICD-10-CM | POA: Diagnosis not present

## 2019-04-15 DIAGNOSIS — N186 End stage renal disease: Secondary | ICD-10-CM | POA: Diagnosis not present

## 2019-04-15 DIAGNOSIS — D631 Anemia in chronic kidney disease: Secondary | ICD-10-CM | POA: Diagnosis not present

## 2019-04-15 DIAGNOSIS — N2581 Secondary hyperparathyroidism of renal origin: Secondary | ICD-10-CM | POA: Diagnosis not present

## 2019-04-15 DIAGNOSIS — D509 Iron deficiency anemia, unspecified: Secondary | ICD-10-CM | POA: Diagnosis not present

## 2019-04-15 DIAGNOSIS — Z992 Dependence on renal dialysis: Secondary | ICD-10-CM | POA: Diagnosis not present

## 2019-04-16 DIAGNOSIS — I0981 Rheumatic heart failure: Secondary | ICD-10-CM | POA: Diagnosis not present

## 2019-04-16 DIAGNOSIS — I5023 Acute on chronic systolic (congestive) heart failure: Secondary | ICD-10-CM | POA: Diagnosis not present

## 2019-04-16 DIAGNOSIS — I2510 Atherosclerotic heart disease of native coronary artery without angina pectoris: Secondary | ICD-10-CM | POA: Diagnosis not present

## 2019-04-16 DIAGNOSIS — I132 Hypertensive heart and chronic kidney disease with heart failure and with stage 5 chronic kidney disease, or end stage renal disease: Secondary | ICD-10-CM | POA: Diagnosis not present

## 2019-04-16 NOTE — Progress Notes (Signed)
Pt scheduled for surgery 04/21/18 at [facility] with Dr. Donzetta Matters. Scheduled to come 04/19/19 for covid test. Pt arrived 04/16/19 at 1200 for covid test. Informed that today would be too early to have her covid test, and that she is scheduled to come on Monday 2/15. Patient lives in [place] and states she is unable to come back next week for covid test. Advised we would be able to do her covid test on her DOS.

## 2019-04-17 DIAGNOSIS — N2581 Secondary hyperparathyroidism of renal origin: Secondary | ICD-10-CM | POA: Diagnosis not present

## 2019-04-17 DIAGNOSIS — D631 Anemia in chronic kidney disease: Secondary | ICD-10-CM | POA: Diagnosis not present

## 2019-04-17 DIAGNOSIS — Z992 Dependence on renal dialysis: Secondary | ICD-10-CM | POA: Diagnosis not present

## 2019-04-17 DIAGNOSIS — D509 Iron deficiency anemia, unspecified: Secondary | ICD-10-CM | POA: Diagnosis not present

## 2019-04-17 DIAGNOSIS — N186 End stage renal disease: Secondary | ICD-10-CM | POA: Diagnosis not present

## 2019-04-19 ENCOUNTER — Inpatient Hospital Stay (HOSPITAL_COMMUNITY)
Admission: RE | Admit: 2019-04-19 | Discharge: 2019-04-19 | Disposition: A | Discharge status: Home / Self Care | Payer: Medicare Other | Source: Ambulatory Visit

## 2019-04-19 DIAGNOSIS — I132 Hypertensive heart and chronic kidney disease with heart failure and with stage 5 chronic kidney disease, or end stage renal disease: Secondary | ICD-10-CM | POA: Diagnosis not present

## 2019-04-19 DIAGNOSIS — N185 Chronic kidney disease, stage 5: Secondary | ICD-10-CM | POA: Diagnosis not present

## 2019-04-19 DIAGNOSIS — I0981 Rheumatic heart failure: Secondary | ICD-10-CM | POA: Diagnosis not present

## 2019-04-19 DIAGNOSIS — I2510 Atherosclerotic heart disease of native coronary artery without angina pectoris: Secondary | ICD-10-CM | POA: Diagnosis not present

## 2019-04-19 DIAGNOSIS — I051 Rheumatic mitral insufficiency: Secondary | ICD-10-CM | POA: Diagnosis not present

## 2019-04-19 DIAGNOSIS — I5023 Acute on chronic systolic (congestive) heart failure: Secondary | ICD-10-CM | POA: Diagnosis not present

## 2019-04-20 ENCOUNTER — Encounter (HOSPITAL_COMMUNITY): Payer: Self-pay | Admitting: Vascular Surgery

## 2019-04-20 ENCOUNTER — Other Ambulatory Visit: Payer: Self-pay

## 2019-04-20 DIAGNOSIS — D631 Anemia in chronic kidney disease: Secondary | ICD-10-CM | POA: Diagnosis not present

## 2019-04-20 DIAGNOSIS — D509 Iron deficiency anemia, unspecified: Secondary | ICD-10-CM | POA: Diagnosis not present

## 2019-04-20 DIAGNOSIS — Z992 Dependence on renal dialysis: Secondary | ICD-10-CM | POA: Diagnosis not present

## 2019-04-20 DIAGNOSIS — N2581 Secondary hyperparathyroidism of renal origin: Secondary | ICD-10-CM | POA: Diagnosis not present

## 2019-04-20 DIAGNOSIS — N186 End stage renal disease: Secondary | ICD-10-CM | POA: Diagnosis not present

## 2019-04-20 NOTE — Progress Notes (Signed)
Spoke with pt for pre-op call. Pt has hx of mild CAD, CHF and A-fib. Pt is on Eliquis, states she stopped it 04/16/19. Pt states she is not diabetic.  Pt states she tested positive for Covid in December, 2020. She will not need to get a Covid test done prior to surgery.  Periop Rx for Pacers faxed to the [facility] this AM.

## 2019-04-21 DIAGNOSIS — D509 Iron deficiency anemia, unspecified: Secondary | ICD-10-CM | POA: Diagnosis not present

## 2019-04-21 DIAGNOSIS — N186 End stage renal disease: Secondary | ICD-10-CM | POA: Diagnosis not present

## 2019-04-21 DIAGNOSIS — D631 Anemia in chronic kidney disease: Secondary | ICD-10-CM | POA: Diagnosis not present

## 2019-04-21 DIAGNOSIS — N2581 Secondary hyperparathyroidism of renal origin: Secondary | ICD-10-CM | POA: Diagnosis not present

## 2019-04-21 DIAGNOSIS — Z992 Dependence on renal dialysis: Secondary | ICD-10-CM | POA: Diagnosis not present

## 2019-04-22 ENCOUNTER — Ambulatory Visit (HOSPITAL_COMMUNITY): Admission: RE | Admit: 2019-04-22 | Payer: Medicare Other | Source: Home / Self Care | Admitting: Vascular Surgery

## 2019-04-22 HISTORY — DX: Glomerular disease in systemic lupus erythematosus: N18.6

## 2019-04-22 HISTORY — DX: Anxiety disorder, unspecified: F41.9

## 2019-04-22 HISTORY — DX: Hypothyroidism, unspecified: E03.9

## 2019-04-22 HISTORY — DX: End stage renal disease: M32.14

## 2019-04-22 SURGERY — ARTERIOVENOUS (AV) FISTULA CREATION
Anesthesia: Choice | Site: Arm Upper | Laterality: Right

## 2019-04-23 DIAGNOSIS — I051 Rheumatic mitral insufficiency: Secondary | ICD-10-CM | POA: Diagnosis not present

## 2019-04-23 DIAGNOSIS — I132 Hypertensive heart and chronic kidney disease with heart failure and with stage 5 chronic kidney disease, or end stage renal disease: Secondary | ICD-10-CM | POA: Diagnosis not present

## 2019-04-23 DIAGNOSIS — I2510 Atherosclerotic heart disease of native coronary artery without angina pectoris: Secondary | ICD-10-CM | POA: Diagnosis not present

## 2019-04-23 DIAGNOSIS — I0981 Rheumatic heart failure: Secondary | ICD-10-CM | POA: Diagnosis not present

## 2019-04-23 DIAGNOSIS — N185 Chronic kidney disease, stage 5: Secondary | ICD-10-CM | POA: Diagnosis not present

## 2019-04-23 DIAGNOSIS — I5023 Acute on chronic systolic (congestive) heart failure: Secondary | ICD-10-CM | POA: Diagnosis not present

## 2019-04-24 DIAGNOSIS — N186 End stage renal disease: Secondary | ICD-10-CM | POA: Diagnosis not present

## 2019-04-24 DIAGNOSIS — Z992 Dependence on renal dialysis: Secondary | ICD-10-CM | POA: Diagnosis not present

## 2019-04-24 DIAGNOSIS — D509 Iron deficiency anemia, unspecified: Secondary | ICD-10-CM | POA: Diagnosis not present

## 2019-04-24 DIAGNOSIS — N2581 Secondary hyperparathyroidism of renal origin: Secondary | ICD-10-CM | POA: Diagnosis not present

## 2019-04-24 DIAGNOSIS — D631 Anemia in chronic kidney disease: Secondary | ICD-10-CM | POA: Diagnosis not present

## 2019-04-26 DIAGNOSIS — N185 Chronic kidney disease, stage 5: Secondary | ICD-10-CM | POA: Diagnosis not present

## 2019-04-26 DIAGNOSIS — I0981 Rheumatic heart failure: Secondary | ICD-10-CM | POA: Diagnosis not present

## 2019-04-26 DIAGNOSIS — I051 Rheumatic mitral insufficiency: Secondary | ICD-10-CM | POA: Diagnosis not present

## 2019-04-26 DIAGNOSIS — I2510 Atherosclerotic heart disease of native coronary artery without angina pectoris: Secondary | ICD-10-CM | POA: Diagnosis not present

## 2019-04-26 DIAGNOSIS — I132 Hypertensive heart and chronic kidney disease with heart failure and with stage 5 chronic kidney disease, or end stage renal disease: Secondary | ICD-10-CM | POA: Diagnosis not present

## 2019-04-26 DIAGNOSIS — I5023 Acute on chronic systolic (congestive) heart failure: Secondary | ICD-10-CM | POA: Diagnosis not present

## 2019-04-27 DIAGNOSIS — N2581 Secondary hyperparathyroidism of renal origin: Secondary | ICD-10-CM | POA: Diagnosis not present

## 2019-04-27 DIAGNOSIS — D631 Anemia in chronic kidney disease: Secondary | ICD-10-CM | POA: Diagnosis not present

## 2019-04-27 DIAGNOSIS — Z992 Dependence on renal dialysis: Secondary | ICD-10-CM | POA: Diagnosis not present

## 2019-04-27 DIAGNOSIS — D509 Iron deficiency anemia, unspecified: Secondary | ICD-10-CM | POA: Diagnosis not present

## 2019-04-27 DIAGNOSIS — N186 End stage renal disease: Secondary | ICD-10-CM | POA: Diagnosis not present

## 2019-04-28 DIAGNOSIS — I2510 Atherosclerotic heart disease of native coronary artery without angina pectoris: Secondary | ICD-10-CM | POA: Diagnosis not present

## 2019-04-28 DIAGNOSIS — I5023 Acute on chronic systolic (congestive) heart failure: Secondary | ICD-10-CM | POA: Diagnosis not present

## 2019-04-28 DIAGNOSIS — I132 Hypertensive heart and chronic kidney disease with heart failure and with stage 5 chronic kidney disease, or end stage renal disease: Secondary | ICD-10-CM | POA: Diagnosis not present

## 2019-04-28 DIAGNOSIS — I0981 Rheumatic heart failure: Secondary | ICD-10-CM | POA: Diagnosis not present

## 2019-04-28 DIAGNOSIS — N185 Chronic kidney disease, stage 5: Secondary | ICD-10-CM | POA: Diagnosis not present

## 2019-04-28 DIAGNOSIS — I051 Rheumatic mitral insufficiency: Secondary | ICD-10-CM | POA: Diagnosis not present

## 2019-04-29 DIAGNOSIS — D631 Anemia in chronic kidney disease: Secondary | ICD-10-CM | POA: Diagnosis not present

## 2019-04-29 DIAGNOSIS — D509 Iron deficiency anemia, unspecified: Secondary | ICD-10-CM | POA: Diagnosis not present

## 2019-04-29 DIAGNOSIS — I509 Heart failure, unspecified: Secondary | ICD-10-CM | POA: Diagnosis not present

## 2019-04-29 DIAGNOSIS — Z6836 Body mass index (BMI) 36.0-36.9, adult: Secondary | ICD-10-CM | POA: Diagnosis not present

## 2019-04-29 DIAGNOSIS — Z992 Dependence on renal dialysis: Secondary | ICD-10-CM | POA: Diagnosis not present

## 2019-04-29 DIAGNOSIS — N185 Chronic kidney disease, stage 5: Secondary | ICD-10-CM | POA: Diagnosis not present

## 2019-04-29 DIAGNOSIS — Z299 Encounter for prophylactic measures, unspecified: Secondary | ICD-10-CM | POA: Diagnosis not present

## 2019-04-29 DIAGNOSIS — I051 Rheumatic mitral insufficiency: Secondary | ICD-10-CM | POA: Diagnosis not present

## 2019-04-29 DIAGNOSIS — I4891 Unspecified atrial fibrillation: Secondary | ICD-10-CM | POA: Diagnosis not present

## 2019-04-29 DIAGNOSIS — I132 Hypertensive heart and chronic kidney disease with heart failure and with stage 5 chronic kidney disease, or end stage renal disease: Secondary | ICD-10-CM | POA: Diagnosis not present

## 2019-04-29 DIAGNOSIS — I429 Cardiomyopathy, unspecified: Secondary | ICD-10-CM | POA: Diagnosis not present

## 2019-04-29 DIAGNOSIS — I2510 Atherosclerotic heart disease of native coronary artery without angina pectoris: Secondary | ICD-10-CM | POA: Diagnosis not present

## 2019-04-29 DIAGNOSIS — K649 Unspecified hemorrhoids: Secondary | ICD-10-CM | POA: Diagnosis not present

## 2019-04-29 DIAGNOSIS — N186 End stage renal disease: Secondary | ICD-10-CM | POA: Diagnosis not present

## 2019-04-29 DIAGNOSIS — I0981 Rheumatic heart failure: Secondary | ICD-10-CM | POA: Diagnosis not present

## 2019-04-29 DIAGNOSIS — N2581 Secondary hyperparathyroidism of renal origin: Secondary | ICD-10-CM | POA: Diagnosis not present

## 2019-04-29 DIAGNOSIS — I5023 Acute on chronic systolic (congestive) heart failure: Secondary | ICD-10-CM | POA: Diagnosis not present

## 2019-05-01 DIAGNOSIS — D631 Anemia in chronic kidney disease: Secondary | ICD-10-CM | POA: Diagnosis not present

## 2019-05-01 DIAGNOSIS — Z992 Dependence on renal dialysis: Secondary | ICD-10-CM | POA: Diagnosis not present

## 2019-05-01 DIAGNOSIS — D509 Iron deficiency anemia, unspecified: Secondary | ICD-10-CM | POA: Diagnosis not present

## 2019-05-01 DIAGNOSIS — N2581 Secondary hyperparathyroidism of renal origin: Secondary | ICD-10-CM | POA: Diagnosis not present

## 2019-05-01 DIAGNOSIS — N186 End stage renal disease: Secondary | ICD-10-CM | POA: Diagnosis not present

## 2019-05-02 DIAGNOSIS — N186 End stage renal disease: Secondary | ICD-10-CM | POA: Diagnosis not present

## 2019-05-03 DIAGNOSIS — I0981 Rheumatic heart failure: Secondary | ICD-10-CM | POA: Diagnosis not present

## 2019-05-03 DIAGNOSIS — I2510 Atherosclerotic heart disease of native coronary artery without angina pectoris: Secondary | ICD-10-CM | POA: Diagnosis not present

## 2019-05-03 DIAGNOSIS — N185 Chronic kidney disease, stage 5: Secondary | ICD-10-CM | POA: Diagnosis not present

## 2019-05-03 DIAGNOSIS — I5023 Acute on chronic systolic (congestive) heart failure: Secondary | ICD-10-CM | POA: Diagnosis not present

## 2019-05-03 DIAGNOSIS — I051 Rheumatic mitral insufficiency: Secondary | ICD-10-CM | POA: Diagnosis not present

## 2019-05-03 DIAGNOSIS — I132 Hypertensive heart and chronic kidney disease with heart failure and with stage 5 chronic kidney disease, or end stage renal disease: Secondary | ICD-10-CM | POA: Diagnosis not present

## 2019-05-04 DIAGNOSIS — E46 Unspecified protein-calorie malnutrition: Secondary | ICD-10-CM | POA: Diagnosis not present

## 2019-05-04 DIAGNOSIS — Z992 Dependence on renal dialysis: Secondary | ICD-10-CM | POA: Diagnosis not present

## 2019-05-04 DIAGNOSIS — E878 Other disorders of electrolyte and fluid balance, not elsewhere classified: Secondary | ICD-10-CM | POA: Diagnosis not present

## 2019-05-04 DIAGNOSIS — N186 End stage renal disease: Secondary | ICD-10-CM | POA: Diagnosis not present

## 2019-05-04 DIAGNOSIS — N2581 Secondary hyperparathyroidism of renal origin: Secondary | ICD-10-CM | POA: Diagnosis not present

## 2019-05-04 DIAGNOSIS — D631 Anemia in chronic kidney disease: Secondary | ICD-10-CM | POA: Diagnosis not present

## 2019-05-04 DIAGNOSIS — D509 Iron deficiency anemia, unspecified: Secondary | ICD-10-CM | POA: Diagnosis not present

## 2019-05-04 DIAGNOSIS — D696 Thrombocytopenia, unspecified: Secondary | ICD-10-CM | POA: Diagnosis not present

## 2019-05-06 DIAGNOSIS — I5023 Acute on chronic systolic (congestive) heart failure: Secondary | ICD-10-CM | POA: Diagnosis not present

## 2019-05-06 DIAGNOSIS — N2581 Secondary hyperparathyroidism of renal origin: Secondary | ICD-10-CM | POA: Diagnosis not present

## 2019-05-06 DIAGNOSIS — I0981 Rheumatic heart failure: Secondary | ICD-10-CM | POA: Diagnosis not present

## 2019-05-06 DIAGNOSIS — N186 End stage renal disease: Secondary | ICD-10-CM | POA: Diagnosis not present

## 2019-05-06 DIAGNOSIS — D631 Anemia in chronic kidney disease: Secondary | ICD-10-CM | POA: Diagnosis not present

## 2019-05-06 DIAGNOSIS — Z992 Dependence on renal dialysis: Secondary | ICD-10-CM | POA: Diagnosis not present

## 2019-05-06 DIAGNOSIS — N185 Chronic kidney disease, stage 5: Secondary | ICD-10-CM | POA: Diagnosis not present

## 2019-05-06 DIAGNOSIS — I132 Hypertensive heart and chronic kidney disease with heart failure and with stage 5 chronic kidney disease, or end stage renal disease: Secondary | ICD-10-CM | POA: Diagnosis not present

## 2019-05-06 DIAGNOSIS — I051 Rheumatic mitral insufficiency: Secondary | ICD-10-CM | POA: Diagnosis not present

## 2019-05-06 DIAGNOSIS — D509 Iron deficiency anemia, unspecified: Secondary | ICD-10-CM | POA: Diagnosis not present

## 2019-05-06 DIAGNOSIS — I2510 Atherosclerotic heart disease of native coronary artery without angina pectoris: Secondary | ICD-10-CM | POA: Diagnosis not present

## 2019-05-07 DIAGNOSIS — I132 Hypertensive heart and chronic kidney disease with heart failure and with stage 5 chronic kidney disease, or end stage renal disease: Secondary | ICD-10-CM | POA: Diagnosis not present

## 2019-05-07 DIAGNOSIS — I5023 Acute on chronic systolic (congestive) heart failure: Secondary | ICD-10-CM | POA: Diagnosis not present

## 2019-05-07 DIAGNOSIS — I2510 Atherosclerotic heart disease of native coronary artery without angina pectoris: Secondary | ICD-10-CM | POA: Diagnosis not present

## 2019-05-07 DIAGNOSIS — N185 Chronic kidney disease, stage 5: Secondary | ICD-10-CM | POA: Diagnosis not present

## 2019-05-07 DIAGNOSIS — I0981 Rheumatic heart failure: Secondary | ICD-10-CM | POA: Diagnosis not present

## 2019-05-07 DIAGNOSIS — I051 Rheumatic mitral insufficiency: Secondary | ICD-10-CM | POA: Diagnosis not present

## 2019-05-08 DIAGNOSIS — D509 Iron deficiency anemia, unspecified: Secondary | ICD-10-CM | POA: Diagnosis not present

## 2019-05-08 DIAGNOSIS — N2581 Secondary hyperparathyroidism of renal origin: Secondary | ICD-10-CM | POA: Diagnosis not present

## 2019-05-08 DIAGNOSIS — Z992 Dependence on renal dialysis: Secondary | ICD-10-CM | POA: Diagnosis not present

## 2019-05-08 DIAGNOSIS — D631 Anemia in chronic kidney disease: Secondary | ICD-10-CM | POA: Diagnosis not present

## 2019-05-08 DIAGNOSIS — N186 End stage renal disease: Secondary | ICD-10-CM | POA: Diagnosis not present

## 2019-05-10 ENCOUNTER — Other Ambulatory Visit: Payer: Self-pay

## 2019-05-10 ENCOUNTER — Encounter (HOSPITAL_COMMUNITY): Payer: Self-pay | Admitting: Vascular Surgery

## 2019-05-10 DIAGNOSIS — M150 Primary generalized (osteo)arthritis: Secondary | ICD-10-CM | POA: Diagnosis not present

## 2019-05-10 DIAGNOSIS — N185 Chronic kidney disease, stage 5: Secondary | ICD-10-CM | POA: Diagnosis not present

## 2019-05-10 DIAGNOSIS — D509 Iron deficiency anemia, unspecified: Secondary | ICD-10-CM | POA: Diagnosis not present

## 2019-05-10 DIAGNOSIS — S81802D Unspecified open wound, left lower leg, subsequent encounter: Secondary | ICD-10-CM | POA: Diagnosis not present

## 2019-05-10 DIAGNOSIS — I701 Atherosclerosis of renal artery: Secondary | ICD-10-CM | POA: Diagnosis not present

## 2019-05-10 DIAGNOSIS — Z950 Presence of cardiac pacemaker: Secondary | ICD-10-CM | POA: Diagnosis not present

## 2019-05-10 DIAGNOSIS — G4733 Obstructive sleep apnea (adult) (pediatric): Secondary | ICD-10-CM | POA: Diagnosis not present

## 2019-05-10 DIAGNOSIS — E669 Obesity, unspecified: Secondary | ICD-10-CM | POA: Diagnosis not present

## 2019-05-10 DIAGNOSIS — I132 Hypertensive heart and chronic kidney disease with heart failure and with stage 5 chronic kidney disease, or end stage renal disease: Secondary | ICD-10-CM | POA: Diagnosis not present

## 2019-05-10 DIAGNOSIS — Z992 Dependence on renal dialysis: Secondary | ICD-10-CM | POA: Diagnosis not present

## 2019-05-10 DIAGNOSIS — Z6836 Body mass index (BMI) 36.0-36.9, adult: Secondary | ICD-10-CM | POA: Diagnosis not present

## 2019-05-10 DIAGNOSIS — I447 Left bundle-branch block, unspecified: Secondary | ICD-10-CM | POA: Diagnosis not present

## 2019-05-10 DIAGNOSIS — I482 Chronic atrial fibrillation, unspecified: Secondary | ICD-10-CM | POA: Diagnosis not present

## 2019-05-10 DIAGNOSIS — S51821D Laceration with foreign body of right forearm, subsequent encounter: Secondary | ICD-10-CM | POA: Diagnosis not present

## 2019-05-10 DIAGNOSIS — Z87891 Personal history of nicotine dependence: Secondary | ICD-10-CM | POA: Diagnosis not present

## 2019-05-10 DIAGNOSIS — I5023 Acute on chronic systolic (congestive) heart failure: Secondary | ICD-10-CM | POA: Diagnosis not present

## 2019-05-10 DIAGNOSIS — D638 Anemia in other chronic diseases classified elsewhere: Secondary | ICD-10-CM | POA: Diagnosis not present

## 2019-05-10 DIAGNOSIS — Z7901 Long term (current) use of anticoagulants: Secondary | ICD-10-CM | POA: Diagnosis not present

## 2019-05-10 DIAGNOSIS — I051 Rheumatic mitral insufficiency: Secondary | ICD-10-CM | POA: Diagnosis not present

## 2019-05-10 DIAGNOSIS — I0981 Rheumatic heart failure: Secondary | ICD-10-CM | POA: Diagnosis not present

## 2019-05-10 DIAGNOSIS — Z9981 Dependence on supplemental oxygen: Secondary | ICD-10-CM | POA: Diagnosis not present

## 2019-05-10 DIAGNOSIS — E039 Hypothyroidism, unspecified: Secondary | ICD-10-CM | POA: Diagnosis not present

## 2019-05-10 DIAGNOSIS — I2510 Atherosclerotic heart disease of native coronary artery without angina pectoris: Secondary | ICD-10-CM | POA: Diagnosis not present

## 2019-05-10 NOTE — Progress Notes (Addendum)
Patient denies chest pain. Reports that shortness of breath is at baseline. Patient speaking in complete sentences during preop interview. States she was dx with COVID 02/22/2019 (result scanned under media tab). States she has ongoing lost of taste and smell d/t same. Cardiologist is with [facility]. Educated on Environmental manager. Pacemaker form faxed to device clinic. Last dose of Eliquis 05/07/19. Will ask anesthesia to review cardiac history.

## 2019-05-11 ENCOUNTER — Encounter: Payer: Medicare Other | Admitting: Internal Medicine

## 2019-05-11 ENCOUNTER — Other Ambulatory Visit: Payer: Self-pay

## 2019-05-11 DIAGNOSIS — D631 Anemia in chronic kidney disease: Secondary | ICD-10-CM | POA: Diagnosis not present

## 2019-05-11 DIAGNOSIS — N2581 Secondary hyperparathyroidism of renal origin: Secondary | ICD-10-CM | POA: Diagnosis not present

## 2019-05-11 DIAGNOSIS — N186 End stage renal disease: Secondary | ICD-10-CM | POA: Diagnosis not present

## 2019-05-11 DIAGNOSIS — Z992 Dependence on renal dialysis: Secondary | ICD-10-CM | POA: Diagnosis not present

## 2019-05-11 DIAGNOSIS — D509 Iron deficiency anemia, unspecified: Secondary | ICD-10-CM | POA: Diagnosis not present

## 2019-05-11 NOTE — Anesthesia Preprocedure Evaluation (Addendum)
Anesthesia Evaluation  Patient identified by MRN, date of birth, ID band Patient awake    Reviewed: Allergy & Precautions, NPO status , Patient's Chart, lab work & pertinent test results, reviewed documented beta blocker date and time   Airway Mallampati: III  TM Distance: >3 FB Neck ROM: Full    Dental  (+) Teeth Intact, Dental Advisory Given   Pulmonary sleep apnea and Continuous Positive Airway Pressure Ventilation , former smoker,  COVID positive 02/2019 Former smoker, quit 1987, 24 pack year history   Pulmonary exam normal breath sounds clear to auscultation       Cardiovascular hypertension, Pt. on medications and Pt. on home beta blockers + CAD, + Peripheral Vascular Disease and +CHF  Normal cardiovascular exam+ dysrhythmias Atrial Fibrillation + pacemaker + Valvular Problems/Murmurs MR and AI  Rhythm:Regular Rate:Normal  Last echo 08/2018:  1. The left ventricle has a visually estimated ejection fraction of 20%.  The cavity size was mildly dilated. There is moderately increased left ventricular wall thickness. Left ventricular diastolic Doppler parameters are indeterminate. Left ventricular diffuse hypokinesis.  2. Normal RV size with mildly decreased systolic function.  3. Left atrial size was severely dilated.  4. The mitral valve is degenerative. There is moderate mitral annular calcification present. Mitral valve regurgitation is moderate by color  flow Doppler. No evidence of mitral valve stenosis.  5. The aortic valve is tricuspid. Mild calcification of the aortic valve.  Aortic valve regurgitation is trivial by color flow Doppler. No stenosis  of the aortic valve.  6. The aortic root is normal in size and structure.  7. There is mild dilatation of the ascending aorta measuring 41 mm.  8. The inferior vena cava was dilated in size with >50% respiratory  variability. PA systolic pressure 47 mmHg.  9. Trivial  pericardial effusion is present.   PAF- on apixaban, amio, carvedilol   Follows with cardiology for NICM and HFrEF (EF 20% by echo 6/20), afib s/p failed DCCVs and she ultimately had AV nodal ablation with St Jude PPM placed (unable to place LV lead).  >99% RV pacing on device interrogation.  Plan is for removal of RA lead and placement of LV lead once she is using AV fistula and has the dialysis catheter out. She is on amiodarone for frequent PVCs.  Last seen by Dr. Aundra Dubin 02/15/19 following admission earlier that month for acute on chronic CHF exacerbation. Per note, mild volume overload on exam though Corvue shows stable thoracic impedance.  NYHA class III symptoms.   R/L Cath 08/14/15: Angiographically normal coronary arteries Nonischemic cardiomyopathy - conflicting cardiac outputs between Fick and thermodilution. Suspect that there is some valvular disease that is throwing off the thermodilution. Essentially normal right heart pressures. Mildly elevated LVEDP and PCWP, but otherwise normal pressures.   Neuro/Psych PSYCHIATRIC DISORDERS Anxiety negative neurological ROS     GI/Hepatic negative GI ROS, Neg liver ROS,   Endo/Other  Hypothyroidism Obesity BMI 34  Renal/GU ESRFRenal diseasek 3.4 HD T/Th/Sat  negative genitourinary   Musculoskeletal  (+) Arthritis , Osteoarthritis,    Abdominal (+) + obese,   Peds negative pediatric ROS (+)  Hematology  (+) Blood dyscrasia, anemia , hct 32   Anesthesia Other Findings Overall very frail and deconditioned, increased WOB at baseline  Reproductive/Obstetrics negative OB ROS                            Anesthesia Physical Anesthesia Plan  ASA:  IV  Anesthesia Plan: MAC and Regional   Post-op Pain Management:  Regional for Post-op pain   Induction:   PONV Risk Score and Plan: 2 and Propofol infusion and TIVA  Airway Management Planned: Natural Airway and Simple Face Mask  Additional Equipment:  None  Intra-op Plan:   Post-operative Plan:   Informed Consent: I have reviewed the patients History and Physical, chart, labs and discussed the procedure including the risks, benefits and alternatives for the proposed anesthesia with the patient or authorized representative who has indicated his/her understanding and acceptance.       Plan Discussed with: CRNA  Anesthesia Plan Comments: (Regional block with minimal sedation)       Anesthesia Quick Evaluation

## 2019-05-11 NOTE — Progress Notes (Addendum)
Anesthesia Chart Review:  Follows with cardiology for NICM and HFrEF (EF 20% by echo 6/20), afib s/p failed DCCVs and she ultimately had AV nodal ablation with St Jude PPM placed (unable to place LV lead).  >99% RV pacing on device interrogation.  Plan is for removal of RA lead and placement of LV lead once she is using AV fistula and has the dialysis catheter out. She is on amiodarone for frequent PVCs.  Last seen by Dr. Aundra Dubin 02/15/19 following admission earlier that month for acute on chronic CHF exacerbation. Per note, mild volume overload on exam though Corvue shows stable thoracic impedance.  NYHA class III symptoms. Recommended removing a bit more fluid with HD. She was still making some urine and advised to take torsemide on non HD days. Advised to followup in 4 months.  Pacemaker form faxed to device clinic.   ESRD currently dialyzing via right IJ tunneled dialysis catheter.   No COVID test. Positive test on 02/22/2019 inside 90 day window and does not qualify for retest.  Will need DOS labs and eval.  EKG 01/27/19: Ventricular-paced rhythm with occasional Premature ventricular complexes. Rate 78.  TTE 08/31/18: 1. The left ventricle has a visually estimated ejection fraction of 20%.  The cavity size was mildly dilated. There is moderately increased left  ventricular wall thickness. Left ventricular diastolic Doppler parameters  are indeterminate. Left ventricular  diffuse hypokinesis.  2. Normal RV size with mildly decreased systolic function.  3. Left atrial size was severely dilated.  4. The mitral valve is degenerative. There is moderate mitral annular  calcification present. Mitral valve regurgitation is moderate by color  flow Doppler. No evidence of mitral valve stenosis.  5. The aortic valve is tricuspid. Mild calcification of the aortic valve.  Aortic valve regurgitation is trivial by color flow Doppler. No stenosis  of the aortic valve.  6. The aortic root is  normal in size and structure.  7. There is mild dilatation of the ascending aorta measuring 41 mm.  8. The inferior vena cava was dilated in size with >50% respiratory  variability. PA systolic pressure 47 mmHg.  9. Trivial pericardial effusion is present.   R/L Cath 08/14/15: 1. Angiographically normal coronary arteries 2. Nonischemic cardiomyopathy - conflicting cardiac outputs between Fick and thermodilution. Suspect that there is some valvular disease that is throwing off the thermodilution. 3. Essentially normal right heart pressures. Mildly elevated LVEDP and PCWP, but otherwise normal pressures.   Clearly nonischemic cardiomyopathy. The patient's itching anatomy with a tandem LAD system and codominant.  Wynonia Musty [facility] Stay Center/Anesthesiology Phone [phone] 05/11/2019 1:25 PM

## 2019-05-12 ENCOUNTER — Ambulatory Visit (HOSPITAL_COMMUNITY): Payer: Medicare Other | Admitting: Physician Assistant

## 2019-05-12 ENCOUNTER — Encounter (HOSPITAL_COMMUNITY): Payer: Self-pay | Admitting: Vascular Surgery

## 2019-05-12 ENCOUNTER — Encounter (HOSPITAL_COMMUNITY)
Admission: RE | Disposition: A | Discharge status: Home / Self Care | Payer: Self-pay | Source: Home / Self Care | Attending: Vascular Surgery

## 2019-05-12 ENCOUNTER — Other Ambulatory Visit: Payer: Self-pay

## 2019-05-12 ENCOUNTER — Observation Stay (HOSPITAL_COMMUNITY)
Admission: RE | Admit: 2019-05-12 | Discharge: 2019-05-13 | Disposition: A | Discharge status: Home / Self Care | Payer: Medicare Other | Attending: Vascular Surgery | Admitting: Vascular Surgery

## 2019-05-12 DIAGNOSIS — I5043 Acute on chronic combined systolic (congestive) and diastolic (congestive) heart failure: Secondary | ICD-10-CM | POA: Diagnosis not present

## 2019-05-12 DIAGNOSIS — I493 Ventricular premature depolarization: Secondary | ICD-10-CM | POA: Diagnosis not present

## 2019-05-12 DIAGNOSIS — N179 Acute kidney failure, unspecified: Secondary | ICD-10-CM | POA: Diagnosis not present

## 2019-05-12 DIAGNOSIS — Z8616 Personal history of COVID-19: Secondary | ICD-10-CM | POA: Insufficient documentation

## 2019-05-12 DIAGNOSIS — I4891 Unspecified atrial fibrillation: Secondary | ICD-10-CM | POA: Insufficient documentation

## 2019-05-12 DIAGNOSIS — I5022 Chronic systolic (congestive) heart failure: Secondary | ICD-10-CM | POA: Diagnosis not present

## 2019-05-12 DIAGNOSIS — Z950 Presence of cardiac pacemaker: Secondary | ICD-10-CM | POA: Diagnosis not present

## 2019-05-12 DIAGNOSIS — R29898 Other symptoms and signs involving the musculoskeletal system: Secondary | ICD-10-CM

## 2019-05-12 DIAGNOSIS — I428 Other cardiomyopathies: Secondary | ICD-10-CM | POA: Diagnosis not present

## 2019-05-12 DIAGNOSIS — N186 End stage renal disease: Secondary | ICD-10-CM

## 2019-05-12 DIAGNOSIS — Z79899 Other long term (current) drug therapy: Secondary | ICD-10-CM | POA: Insufficient documentation

## 2019-05-12 DIAGNOSIS — I132 Hypertensive heart and chronic kidney disease with heart failure and with stage 5 chronic kidney disease, or end stage renal disease: Secondary | ICD-10-CM | POA: Diagnosis not present

## 2019-05-12 DIAGNOSIS — Z992 Dependence on renal dialysis: Secondary | ICD-10-CM | POA: Diagnosis not present

## 2019-05-12 DIAGNOSIS — R531 Weakness: Secondary | ICD-10-CM | POA: Insufficient documentation

## 2019-05-12 DIAGNOSIS — N185 Chronic kidney disease, stage 5: Secondary | ICD-10-CM | POA: Diagnosis not present

## 2019-05-12 HISTORY — DX: Personal history of COVID-19: Z86.16

## 2019-05-12 HISTORY — PX: BASCILIC VEIN TRANSPOSITION: SHX5742

## 2019-05-12 LAB — POCT I-STAT, CHEM 8
BUN: 33 mg/dL — ABNORMAL HIGH (ref 8–23)
Calcium, Ion: 1.07 mmol/L — ABNORMAL LOW (ref 1.15–1.40)
Chloride: 99 mmol/L (ref 98–111)
Creatinine, Ser: 2.9 mg/dL — ABNORMAL HIGH (ref 0.44–1.00)
Glucose, Bld: 94 mg/dL (ref 70–99)
HCT: 32 % — ABNORMAL LOW (ref 36.0–46.0)
Hemoglobin: 10.9 g/dL — ABNORMAL LOW (ref 12.0–15.0)
Potassium: 3.4 mmol/L — ABNORMAL LOW (ref 3.5–5.1)
Sodium: 138 mmol/L (ref 135–145)
TCO2: 31 mmol/L (ref 22–32)

## 2019-05-12 SURGERY — TRANSPOSITION, VEIN, BASILIC
Anesthesia: Monitor Anesthesia Care | Site: Arm Upper | Laterality: Right

## 2019-05-12 MED ORDER — CARVEDILOL 12.5 MG PO TABS
12.5000 mg | ORAL_TABLET | Freq: Two times a day (BID) | ORAL | Status: DC
  Administered 2019-05-12 – 2019-05-13 (×2): 12.5 mg via ORAL
  Filled 2019-05-12 (×2): qty 1

## 2019-05-12 MED ORDER — ALPRAZOLAM 0.25 MG PO TABS: 0.2500 mg | ORAL_TABLET | Freq: Every evening | ORAL | Status: DC | PRN

## 2019-05-12 MED ORDER — FENTANYL CITRATE (PF) 250 MCG/5ML IJ SOLN
INTRAMUSCULAR | Status: AC
  Filled 2019-05-12: qty 5

## 2019-05-12 MED ORDER — CIPROFLOXACIN IN D5W 400 MG/200ML IV SOLN
400.0000 mg | INTRAVENOUS | Status: AC
  Administered 2019-05-12: 400 mg via INTRAVENOUS

## 2019-05-12 MED ORDER — RENA-VITE PO TABS
1.0000 | ORAL_TABLET | Freq: Every day | ORAL | Status: DC
  Administered 2019-05-12: 1 via ORAL
  Filled 2019-05-12: qty 1

## 2019-05-12 MED ORDER — ROPIVACAINE HCL 5 MG/ML IJ SOLN
INTRAMUSCULAR | Status: DC | PRN
  Administered 2019-05-12: 30 mL via PERINEURAL

## 2019-05-12 MED ORDER — BISACODYL 10 MG RE SUPP: 10.0000 mg | Freq: Every day | RECTAL | Status: DC | PRN

## 2019-05-12 MED ORDER — COLCHICINE 0.3 MG HALF TABLET
0.3000 mg | ORAL_TABLET | Freq: Every day | ORAL | Status: DC
  Administered 2019-05-12: 0.3 mg via ORAL
  Filled 2019-05-12: qty 0.5
  Filled 2019-05-12: qty 1

## 2019-05-12 MED ORDER — ONDANSETRON HCL 4 MG/2ML IJ SOLN: 4.0000 mg | Freq: Four times a day (QID) | INTRAMUSCULAR | Status: DC | PRN

## 2019-05-12 MED ORDER — SODIUM CHLORIDE 0.9 % IV SOLN: INTRAVENOUS | Status: DC

## 2019-05-12 MED ORDER — ONDANSETRON HCL 4 MG/2ML IJ SOLN
INTRAMUSCULAR | Status: DC | PRN
  Administered 2019-05-12: 4 mg via INTRAVENOUS

## 2019-05-12 MED ORDER — OXYCODONE-ACETAMINOPHEN 5-325 MG PO TABS: 1.0000 | ORAL_TABLET | ORAL | Status: DC | PRN

## 2019-05-12 MED ORDER — FENTANYL CITRATE (PF) 100 MCG/2ML IJ SOLN: 50.0000 ug | Freq: Once | INTRAMUSCULAR | Status: AC

## 2019-05-12 MED ORDER — SODIUM CHLORIDE 0.9 % IV SOLN
INTRAVENOUS | Status: DC | PRN
  Administered 2019-05-12: 500 mL

## 2019-05-12 MED ORDER — MORPHINE SULFATE (PF) 2 MG/ML IV SOLN: 2.0000 mg | INTRAVENOUS | Status: DC | PRN

## 2019-05-12 MED ORDER — 0.9 % SODIUM CHLORIDE (POUR BTL) OPTIME
TOPICAL | Status: DC | PRN
  Administered 2019-05-12: 1000 mL

## 2019-05-12 MED ORDER — PAPAVERINE HCL 30 MG/ML IJ SOLN
INTRAMUSCULAR | Status: AC
  Filled 2019-05-12: qty 2

## 2019-05-12 MED ORDER — FENTANYL CITRATE (PF) 100 MCG/2ML IJ SOLN: 25.0000 ug | INTRAMUSCULAR | Status: DC | PRN

## 2019-05-12 MED ORDER — SODIUM CHLORIDE 0.9 % IV SOLN: INTRAVENOUS | Status: DC | PRN

## 2019-05-12 MED ORDER — ONDANSETRON HCL 4 MG/2ML IJ SOLN: 4.0000 mg | Freq: Once | INTRAMUSCULAR | Status: DC | PRN

## 2019-05-12 MED ORDER — PHENYLEPHRINE HCL-NACL 10-0.9 MG/250ML-% IV SOLN
INTRAVENOUS | Status: DC | PRN
  Administered 2019-05-12: 25 ug/min via INTRAVENOUS

## 2019-05-12 MED ORDER — LOPERAMIDE HCL 1 MG/7.5ML PO SUSP
1.0000 mg | ORAL | Status: DC | PRN
  Filled 2019-05-12: qty 7.5

## 2019-05-12 MED ORDER — PHENOL 1.4 % MT LIQD: 1.0000 | OROMUCOSAL | Status: DC | PRN

## 2019-05-12 MED ORDER — FENTANYL CITRATE (PF) 100 MCG/2ML IJ SOLN
INTRAMUSCULAR | Status: AC
  Administered 2019-05-12: 50 ug via INTRAVENOUS
  Filled 2019-05-12: qty 2

## 2019-05-12 MED ORDER — AMIODARONE HCL 200 MG PO TABS
200.0000 mg | ORAL_TABLET | Freq: Every day | ORAL | Status: DC
  Administered 2019-05-13: 200 mg via ORAL
  Filled 2019-05-12: qty 1

## 2019-05-12 MED ORDER — SEVELAMER CARBONATE 800 MG PO TABS
800.0000 mg | ORAL_TABLET | Freq: Three times a day (TID) | ORAL | Status: DC
  Administered 2019-05-12 – 2019-05-13 (×2): 800 mg via ORAL
  Filled 2019-05-12 (×2): qty 1

## 2019-05-12 MED ORDER — LIDOCAINE HCL (PF) 1 % IJ SOLN
INTRAMUSCULAR | Status: DC | PRN
  Administered 2019-05-12: 10 mL

## 2019-05-12 MED ORDER — MIDAZOLAM HCL 2 MG/2ML IJ SOLN
INTRAMUSCULAR | Status: AC
  Filled 2019-05-12: qty 2

## 2019-05-12 MED ORDER — PROPOFOL 10 MG/ML IV BOLUS
INTRAVENOUS | Status: AC
  Filled 2019-05-12: qty 40

## 2019-05-12 MED ORDER — METOPROLOL TARTRATE 5 MG/5ML IV SOLN: 2.0000 mg | INTRAVENOUS | Status: DC | PRN

## 2019-05-12 MED ORDER — CIPROFLOXACIN IN D5W 400 MG/200ML IV SOLN
INTRAVENOUS | Status: AC
  Filled 2019-05-12: qty 200

## 2019-05-12 MED ORDER — SENNOSIDES-DOCUSATE SODIUM 8.6-50 MG PO TABS: 1.0000 | ORAL_TABLET | Freq: Every evening | ORAL | Status: DC | PRN

## 2019-05-12 MED ORDER — LABETALOL HCL 5 MG/ML IV SOLN: 10.0000 mg | INTRAVENOUS | Status: DC | PRN

## 2019-05-12 MED ORDER — HYDRALAZINE HCL 20 MG/ML IJ SOLN: 5.0000 mg | INTRAMUSCULAR | Status: DC | PRN

## 2019-05-12 MED ORDER — PANTOPRAZOLE SODIUM 40 MG PO TBEC
40.0000 mg | DELAYED_RELEASE_TABLET | Freq: Every day | ORAL | Status: DC
  Administered 2019-05-13: 40 mg via ORAL
  Filled 2019-05-12: qty 1

## 2019-05-12 MED ORDER — SODIUM CHLORIDE 0.9 % IV SOLN
INTRAVENOUS | Status: AC
  Filled 2019-05-12: qty 1.2

## 2019-05-12 MED ORDER — DEXAMETHASONE SODIUM PHOSPHATE 10 MG/ML IJ SOLN
INTRAMUSCULAR | Status: DC | PRN
  Administered 2019-05-12: 10 mg

## 2019-05-12 MED ORDER — LIDOCAINE-EPINEPHRINE (PF) 1 %-1:200000 IJ SOLN
INTRAMUSCULAR | Status: DC | PRN
  Administered 2019-05-12: 17 mL

## 2019-05-12 MED ORDER — ACETAMINOPHEN 325 MG PO TABS: 325.0000 mg | ORAL_TABLET | ORAL | Status: DC | PRN

## 2019-05-12 MED ORDER — PROPOFOL 500 MG/50ML IV EMUL
INTRAVENOUS | Status: DC | PRN
  Administered 2019-05-12: 25 ug/kg/min via INTRAVENOUS

## 2019-05-12 MED ORDER — ACETAMINOPHEN 325 MG RE SUPP: 325.0000 mg | RECTAL | Status: DC | PRN

## 2019-05-12 MED ORDER — GUAIFENESIN-DM 100-10 MG/5ML PO SYRP: 15.0000 mL | ORAL_SOLUTION | ORAL | Status: DC | PRN

## 2019-05-12 MED ORDER — TORSEMIDE 20 MG PO TABS
40.0000 mg | ORAL_TABLET | ORAL | Status: DC
  Filled 2019-05-12: qty 2

## 2019-05-12 MED ORDER — LEVOTHYROXINE SODIUM 75 MCG PO TABS
75.0000 ug | ORAL_TABLET | Freq: Every day | ORAL | Status: DC
  Administered 2019-05-13: 75 ug via ORAL
  Filled 2019-05-12: qty 1

## 2019-05-12 MED ORDER — LIDOCAINE-EPINEPHRINE 1 %-1:100000 IJ SOLN
INTRAMUSCULAR | Status: AC
  Filled 2019-05-12: qty 1

## 2019-05-12 SURGICAL SUPPLY — 38 items
ARMBAND PINK RESTRICT EXTREMIT (MISCELLANEOUS) ×3 IMPLANT
CANISTER SUCT 3000ML PPV (MISCELLANEOUS) ×3 IMPLANT
CLIP VESOCCLUDE MED 24/CT (CLIP) ×2 IMPLANT
CLIP VESOCCLUDE MED 6/CT (CLIP) IMPLANT
CLIP VESOCCLUDE SM WIDE 24/CT (CLIP) ×2 IMPLANT
CLIP VESOCCLUDE SM WIDE 6/CT (CLIP) IMPLANT
COVER PROBE W GEL 5X96 (DRAPES) ×3 IMPLANT
COVER WAND RF STERILE (DRAPES) ×3 IMPLANT
DECANTER SPIKE VIAL GLASS SM (MISCELLANEOUS) ×2 IMPLANT
DERMABOND ADVANCED (GAUZE/BANDAGES/DRESSINGS) ×2
DERMABOND ADVANCED .7 DNX12 (GAUZE/BANDAGES/DRESSINGS) ×1 IMPLANT
ELECT REM PT RETURN 9FT ADLT (ELECTROSURGICAL) ×3
ELECTRODE REM PT RTRN 9FT ADLT (ELECTROSURGICAL) ×1 IMPLANT
GLOVE BIO SURGEON STRL SZ 6.5 (GLOVE) ×2 IMPLANT
GLOVE BIO SURGEON STRL SZ7.5 (GLOVE) ×3 IMPLANT
GLOVE BIO SURGEONS STRL SZ 6.5 (GLOVE) ×2
GLOVE BIOGEL PI IND STRL 7.0 (GLOVE) IMPLANT
GLOVE BIOGEL PI INDICATOR 7.0 (GLOVE) ×6
GOWN STRL REUS W/ TWL LRG LVL3 (GOWN DISPOSABLE) ×2 IMPLANT
GOWN STRL REUS W/ TWL XL LVL3 (GOWN DISPOSABLE) ×1 IMPLANT
GOWN STRL REUS W/TWL LRG LVL3 (GOWN DISPOSABLE) ×4
GOWN STRL REUS W/TWL XL LVL3 (GOWN DISPOSABLE) ×2
KIT BASIN OR (CUSTOM PROCEDURE TRAY) ×3 IMPLANT
KIT TURNOVER KIT B (KITS) ×3 IMPLANT
NS IRRIG 1000ML POUR BTL (IV SOLUTION) ×3 IMPLANT
PACK CV ACCESS (CUSTOM PROCEDURE TRAY) ×3 IMPLANT
PAD ARMBOARD 7.5X6 YLW CONV (MISCELLANEOUS) ×6 IMPLANT
SUT MNCRL AB 4-0 PS2 18 (SUTURE) ×5 IMPLANT
SUT PROLENE 6 0 BV (SUTURE) ×5 IMPLANT
SUT SILK 2 0 PERMA HAND 18 BK (SUTURE) ×2 IMPLANT
SUT SILK 2 0 SH (SUTURE) IMPLANT
SUT SILK 3 0 (SUTURE) ×2
SUT SILK 3-0 18XBRD TIE 12 (SUTURE) IMPLANT
SUT VIC AB 3-0 SH 27 (SUTURE) ×4
SUT VIC AB 3-0 SH 27X BRD (SUTURE) ×1 IMPLANT
TOWEL GREEN STERILE (TOWEL DISPOSABLE) ×3 IMPLANT
UNDERPAD 30X30 (UNDERPADS AND DIAPERS) ×3 IMPLANT
WATER STERILE IRR 1000ML POUR (IV SOLUTION) ×3 IMPLANT

## 2019-05-12 NOTE — H&P (Signed)
   History and Physical Update  The patient was interviewed and re-examined.  The patient's previous History and Physical has been reviewed and is unchanged from recent office visit. Plan for right arm second stage avf.   Clarene Curran C. Donzetta Matters, MD [facility] [place] Office: [phone] Pager: [phone]  05/12/2019, 8:38 AM

## 2019-05-12 NOTE — Progress Notes (Signed)
Dr. Doroteo Glassman aware of pacemaker. No new orders at this time.   Jacqlyn Larsen, RN

## 2019-05-12 NOTE — Transfer of Care (Signed)
Immediate Anesthesia Transfer of Care Note  Patient: Gina [name]  Procedure(s) Performed: 2ND STAGE RIGHT UPPER ARM BASCILIC VEIN TRANSPOSITION (Right Arm Upper)  Patient Location: PACU  Anesthesia Type:MAC and Regional  Level of Consciousness: awake, alert , oriented and sedated  Airway & Oxygen Therapy: Patient Spontanous Breathing and Patient connected to face mask oxygen  Post-op Assessment: Report given to RN, Post -op Vital signs reviewed and stable and Patient moving all extremities  Post vital signs: Reviewed and stable  Last Vitals:  Vitals Value Taken Time  BP 97/65 05/12/19 1116  Temp    Pulse 70 05/12/19 1125  Resp 18 05/12/19 1125  SpO2 96 % 05/12/19 1125  Vitals shown include unvalidated device data.  Last Pain:  Vitals:   05/12/19 0915  TempSrc:   PainSc: Asleep      Patients Stated Pain Goal: 3 (76/81/15 7262)  Complications: No apparent anesthesia complications

## 2019-05-12 NOTE — Op Note (Signed)
    Patient name: Gina [name] MRN: [id number] DOB: 09-13-1940 Sex: female  05/12/2019 Pre-operative Diagnosis: esrd Post-operative diagnosis:  Same Surgeon:  Erlene Quan C. Donzetta Matters, MD Assistant: Risa Grill, PA Procedure Performed:   Revision of right arm basilic vein fistula with transposition  Indications: 79 year old female with end-stage renal disease.  She has a first stage basilic vein fistula she is now indicated for second stage.  Findings: Vein was large measuring greater than a centimeter throughout its course.  At completion there was a very strong thrill she also had a palpable radial artery pulse at the wrist both confirmed with Doppler.   Procedure:  The patient was identified in the holding area and taken to the operating where she is placed supine operative MAC anesthesia was induced.  She was gently prepped and draped in the right upper extremity usual fashion antibiotics were minister and timeout was called.  We first anesthetized overlying the palpable fistula as well as the expected tunneling tract.  I opened the previous incision.  We dissected out the fistula back to the anastomosis.  We dissected out for several centimeters dividing branches tween clips and ties.  A second incision was made in the axilla.  We dissected the fistula out through there.  When all branches were divided we then clamped the fistula near the anastomosis.  We marked it for orientation.  We transected it.  We then flushed with heparinized saline.  We tunneled it laterally.  We removed one area that was narrowed where we transected it.  We then spatulated both ends and sewed end-to-end with 6-0 Prolene suture.  Prior completion allowed flushing all direction.  Upon completion was a very strong thrill.  There is palpable radial artery pulse the wrist.  Satisfied with this we obtain hemostasis closed in layers with Vicryl and Monocryl.  Dermabond placed to the level of skin.  She was awakened anesthesia having  tolerated procedure well immediate complication but all counts were correct at completion.  EBL: 50 cc   Korban Shearer C. Donzetta Matters, MD [facility] [place] Office: [phone] Pager: [phone]

## 2019-05-12 NOTE — Anesthesia Procedure Notes (Signed)
Date/Time: 05/12/2019 9:30 AM Performed by: Scheryl Darter, CRNA Pre-anesthesia Checklist: Patient identified, Suction available, Emergency Drugs available, Patient being monitored and Timeout performed Patient Re-evaluated:Patient Re-evaluated prior to induction Oxygen Delivery Method: Simple face mask Placement Confirmation: positive ETCO2

## 2019-05-12 NOTE — Anesthesia Procedure Notes (Signed)
Anesthesia Regional Block: Supraclavicular block   Pre-Anesthetic Checklist: ,, timeout performed, Correct Patient, Correct Site, Correct Laterality, Correct Procedure, Correct Position, site marked, Risks and benefits discussed,  Surgical consent,  Pre-op evaluation,  At surgeon's request and post-op pain management  Laterality: Right  Prep: Maximum Sterile Barrier Precautions used, chloraprep       Needles:  Injection technique: Single-shot  Needle Type: Echogenic Stimulator Needle     Needle Length: 9cm  Needle Gauge: 22     Additional Needles:   Procedures:,,,, ultrasound used (permanent image in chart),,,,  Narrative:  Start time: 05/12/2019 9:00 AM End time: 05/12/2019 9:10 AM Injection made incrementally with aspirations every 5 mL.  Performed by: Personally  Anesthesiologist: Pervis Hocking, DO  Additional Notes: Monitors applied. No increased pain on injection. No increased resistance to injection. Injection made in 5cc increments. Good needle visualization. Patient tolerated procedure well.  Also intercosctobrachial nerve block via infiltration in axilla- 16mL 1% lidocaine

## 2019-05-12 NOTE — Anesthesia Postprocedure Evaluation (Signed)
Anesthesia Post Note  Patient: Gina [name]  Procedure(s) Performed: 2ND STAGE RIGHT UPPER ARM [place] (Right Arm Upper)     Patient location during evaluation: PACU Anesthesia Type: Regional and MAC Level of consciousness: awake and alert Pain management: pain level controlled Vital Signs Assessment: post-procedure vital signs reviewed and stable Respiratory status: spontaneous breathing, nonlabored ventilation and respiratory function stable Cardiovascular status: blood pressure returned to baseline and stable Postop Assessment: no apparent nausea or vomiting Anesthetic complications: no    Last Vitals:  Vitals:   05/12/19 1215 05/12/19 1315  BP: (!) 99/54 (!) 96/54  Pulse: 70 76  Resp: 17 20  Temp:  36.8 C  SpO2: 95% 98%    Last Pain:  Vitals:   05/12/19 1315  TempSrc:   PainSc: 0-No pain                 Pervis Hocking

## 2019-05-12 NOTE — Progress Notes (Signed)
Pt received from PACU. VSS. CHG complete. Pt and family oriented to room and unit. Call light in reach. Will continue to monitor.  Clyde Canterbury, RN

## 2019-05-12 NOTE — Progress Notes (Signed)
Dr. Donzetta Matters aware that patient has skin tear to right posterior forearm.

## 2019-05-13 ENCOUNTER — Observation Stay (HOSPITAL_COMMUNITY): Payer: Medicare Other

## 2019-05-13 DIAGNOSIS — N2581 Secondary hyperparathyroidism of renal origin: Secondary | ICD-10-CM | POA: Diagnosis not present

## 2019-05-13 DIAGNOSIS — D631 Anemia in chronic kidney disease: Secondary | ICD-10-CM | POA: Diagnosis not present

## 2019-05-13 DIAGNOSIS — D509 Iron deficiency anemia, unspecified: Secondary | ICD-10-CM | POA: Diagnosis not present

## 2019-05-13 DIAGNOSIS — Z992 Dependence on renal dialysis: Secondary | ICD-10-CM | POA: Diagnosis not present

## 2019-05-13 DIAGNOSIS — I5022 Chronic systolic (congestive) heart failure: Secondary | ICD-10-CM | POA: Diagnosis not present

## 2019-05-13 DIAGNOSIS — R531 Weakness: Secondary | ICD-10-CM | POA: Diagnosis not present

## 2019-05-13 DIAGNOSIS — Z8616 Personal history of COVID-19: Secondary | ICD-10-CM | POA: Diagnosis not present

## 2019-05-13 DIAGNOSIS — I132 Hypertensive heart and chronic kidney disease with heart failure and with stage 5 chronic kidney disease, or end stage renal disease: Secondary | ICD-10-CM | POA: Diagnosis not present

## 2019-05-13 DIAGNOSIS — N186 End stage renal disease: Secondary | ICD-10-CM | POA: Diagnosis not present

## 2019-05-13 DIAGNOSIS — I428 Other cardiomyopathies: Secondary | ICD-10-CM | POA: Diagnosis not present

## 2019-05-13 MED ORDER — CHLORHEXIDINE GLUCONATE CLOTH 2 % EX PADS
6.0000 | MEDICATED_PAD | Freq: Every day | CUTANEOUS | Status: DC
  Administered 2019-05-13: 6 via TOPICAL

## 2019-05-13 MED ORDER — HEPARIN SODIUM (PORCINE) 1000 UNIT/ML IJ SOLN
1.6000 mL | Freq: Once | INTRAMUSCULAR | Status: AC
  Administered 2019-05-13: 1600 [IU]

## 2019-05-13 MED ORDER — HYDROCODONE-ACETAMINOPHEN 5-325 MG PO TABS: 1.0000 | ORAL_TABLET | ORAL | 0 refills | Status: DC | PRN

## 2019-05-13 NOTE — Progress Notes (Addendum)
Progress Note    05/13/2019 7:14 AM 1 Day Post-Op  Subjective: Complaining of right upper extremity weakness.  Otherwise feels well without hand pain or shortness of breath.  Tolerating diet  Status post right upper extremity second stage BSC  Vitals:   05/13/19 0500 05/13/19 0616  BP: 103/61 110/66  Pulse: 73 78  Resp: 13 15  Temp:  98 F (36.7 C)  SpO2: 94% 92%    Physical Exam: Neuro: Alert and oriented x4.  Tongue is midline. Cardiac: Rate rhythm regular Lungs: Nonlabored Incisions: Well approximated.  No bleeding Extremities: Right upper extremity weakness with 3 out of 5 grip strength.  She cannot touch her nose with her index finger.  Her hand is warm.  She has a palpable radial pulse and dopplerable ulnar and palmar arch signals.   CBC    Component Value Date/Time   WBC 5.3 02/06/2019 0351   RBC 3.07 (L) 02/06/2019 0351   HGB 10.9 (L) 05/12/2019 0855   HGB 9.9 (L) 12/25/2018 1134   HCT 32.0 (L) 05/12/2019 0855   HCT 29.6 (L) 12/25/2018 1134   PLT 195 02/06/2019 0351   PLT 151 12/25/2018 1134   MCV 105.9 (H) 02/06/2019 0351   MCV 103 (H) 12/25/2018 1134   MCH 33.6 02/06/2019 0351   MCHC 31.7 02/06/2019 0351   RDW 15.4 02/06/2019 0351   RDW 15.2 12/25/2018 1134   LYMPHSABS 0.4 (L) 01/31/2019 0441   LYMPHSABS 0.3 (L) 12/25/2018 1134   MONOABS 0.7 01/31/2019 0441   EOSABS 0.2 01/31/2019 0441   EOSABS 0.0 12/25/2018 1134   BASOSABS 0.0 01/31/2019 0441   BASOSABS 0.0 12/25/2018 1134    BMET    Component Value Date/Time   NA 138 05/12/2019 0855   NA 138 12/25/2018 1134   K 3.4 (L) 05/12/2019 0855   CL 99 05/12/2019 0855   CO2 27 02/06/2019 0351   GLUCOSE 94 05/12/2019 0855   BUN 33 (H) 05/12/2019 0855   BUN 99 (HH) 12/25/2018 1134   CREATININE 2.90 (H) 05/12/2019 0855   CALCIUM 8.6 (L) 02/06/2019 0351   GFRNONAA 20 (L) 02/06/2019 0351   GFRAA 23 (L) 02/06/2019 0351     Intake/Output Summary (Last 24 hours) at 05/13/2019 0714 Last data filed  at 05/13/2019 0705 Gross per 24 hour  Intake 690 ml  Output 125 ml  Net 565 ml    HOSPITAL MEDICATIONS Scheduled Meds: . amiodarone  200 mg Oral Daily  . carvedilol  12.5 mg Oral BID  . colchicine  0.3 mg Oral QHS  . levothyroxine  75 mcg Oral Q0600  . multivitamin  1 tablet Oral QHS  . pantoprazole  40 mg Oral Daily  . sevelamer carbonate  800 mg Oral TID WC  . torsemide  40 mg Oral Once per day on Sun Tue Thu Sat   Continuous Infusions: . sodium chloride     PRN Meds:.sodium chloride, acetaminophen **OR** acetaminophen, ALPRAZolam, bisacodyl, guaiFENesin-dextromethorphan, hydrALAZINE, labetalol, loperamide HCl, metoprolol tartrate, morphine injection, ondansetron, oxyCODONE-acetaminophen, phenol, senna-docusate  Assessment:  79 y.o. female is s/p: Revision of right arm basilic vein fistula transposition.  Right upper extremity weakness.  She is otherwise neurologically intact with stable vital signs.  1 Day Post-Op  Plan: -Will discuss with Dr. Donzetta Matters.  Likely home today.  She dialyzes in [place] and has a chair time at 1 PM today.   -DVT prophylaxis: CDs   Risa Grill, PA-C [facility] [phone] 05/13/2019  7:14 AM  I have independently interviewed and patient and agree with PA assessment and plan above.  CT negative for any new issues.  Likely resolving right upper extremity block.  Okay for discharge today for dialysis.  Jacarri Gesner C. Donzetta Matters, MD [facility] [place] Office: [phone] Pager: [phone]

## 2019-05-13 NOTE — Discharge Summary (Addendum)
Discharge Summary  Patient ID: Gina [name] [id number] 79 y.o. [birth year]  Admit date: 05/12/2019  Discharge date and time: 05/13/2019 10:33 AM   Admitting Physician: Waynetta Sandy, MD   Discharge Physician: Waynetta Sandy, MD  Admission Diagnoses: ESRD on dialysis [facility]) [N18.6, Z99.2]  Discharge Diagnoses: ESRD on dialysis ([place]) [N18.6, Z99.2] Admission Condition: good  Discharged Condition: good  Indication for Admission: second stage right upper extremity basilic vein transposition  Hospital Course: Admission the patient was taken the operating room where she underwent a second stage right upper extremity basilic vein transposition.  She tolerated procedure well.   Consults: None  Treatments: surgery: Second stage right upper extremity basilic vein, analgesics, observation  Discharge Exam: Patient awake and alert no apparent distress.  She has some residual right upper extremity anesthesia from her regional block.  Her vital signs are stable and she is afebrile.  Due to her right hand and forearm weakness, noncontrasted CT scan of brain was performed to ensure there is no signs of CVA.  She was otherwise neurologically intact, alert and oriented x4, tongue midline.  She has no hand pain, 2+ palpable radial pulse.  Incision is without bleeding and she has minimal edema.  She is ready for discharge home Vitals:   05/13/19 0616 05/13/19 0752  BP: 110/66 114/70  Pulse: 78 72  Resp: 15 16  Temp: 98 F (36.7 C) 98.4 F (36.9 C)  SpO2: 92% 91%   Cardiac: Rate and rhythm are regular Lungs: non labored Incisions: Right upper extremity incisions x 2 are well approximated Extremities: Right upper extremity motor weakness.  2+ radial pulse. Good bruit in fistula Neurologic: Oriented x4  CT head: IMPRESSION: No focal acute intracranial abnormality identified. Chronic diffuse atrophy. Chronic bilateral periventricular white matter small vessel ischemic  change.  Disposition: Discharge disposition: 01-Home or Self Care       Patient Instructions:  Allergies as of 05/13/2019      Reactions   Bactrim [sulfamethoxazole-trimethoprim] Other (See Comments)   Flu like symptoms   Cephalexin Rash   Rash to arms/legs      Medication List    TAKE these medications   acetaminophen 500 MG tablet Commonly known as: TYLENOL Take 1,000 mg by mouth at bedtime.   ALPRAZolam 0.5 MG tablet Commonly known as: XANAX Take 0.25 mg by mouth at bedtime as needed for anxiety or sleep.   amiodarone 200 MG tablet Commonly known as: PACERONE Take 200 mg by mouth daily.   apixaban 5 MG Tabs tablet Commonly known as: Eliquis Take 1 tablet (5 mg total) by mouth 2 (two) times daily.   carvedilol 12.5 MG tablet Commonly known as: COREG Take 1 tablet (12.5 mg total) by mouth 2 (two) times daily.   colchicine 0.6 MG tablet Take 0.3 mg by mouth at bedtime.   HYDROcodone-acetaminophen 5-325 MG tablet Commonly known as: NORCO/VICODIN Take 1 tablet by mouth every 4 (four) hours as needed for moderate pain.   levothyroxine 75 MCG tablet Commonly known as: SYNTHROID Take 75 mcg by mouth daily before breakfast.   loperamide 1 MG/5ML solution Commonly known as: IMODIUM Take 1 mg by mouth as needed for diarrhea or loose stools.   multivitamin Tabs tablet Take 1 tablet by mouth at bedtime.   sevelamer carbonate 800 MG tablet Commonly known as: RENVELA Take 800 mg by mouth 3 (three) times daily with meals.   sodium chloride 0.65 % Soln nasal spray Commonly known as: OCEAN Place 1 spray  into both nostrils 4 (four) times daily as needed for congestion.   torsemide 20 MG tablet Commonly known as: DEMADEX Take 1 tablet (20 mg total) by mouth 3 (three) times a week. Monday, Wednesday and Friday What changed:   how much to take  when to take this  additional instructions      Activity: activity as tolerated Diet: renal diet Wound Care:  none needed  Follow-up with Dr. Donzetta Matters in 2 weeks.  Signed: Barbie Banner, PA-C 05/13/2019 10:39 AM [facility] Office: [phone]

## 2019-05-13 NOTE — Progress Notes (Signed)
Discharge instruction gave to pt and the pt's niece. Pt and the niece verbalized understanding and all questions were answered. Medication reviewed. HD catheter was heparinized and deaccessed by IV team.   Lavenia Atlas, RN

## 2019-05-13 NOTE — Discharge Instructions (Signed)
° °  [facility] [place] ° °Discharge Instructions ° °AV Fistula or Graft Surgery for Dialysis Access ° °Please refer to the following instructions for your post-procedure care. Your surgeon or physician assistant will discuss any changes with you. ° °Activity ° °You may drive the day following your surgery, if you are comfortable and no longer taking prescription pain medication. Resume full activity as the soreness in your incision resolves. ° °Bathing/Showering ° °You may shower after you go home. Keep your incision dry for 48 hours. Do not soak in a bathtub, hot tub, or swim until the incision heals completely. You may not shower if you have a hemodialysis catheter. ° °Incision Care ° °Clean your incision with mild soap and water after 48 hours. Pat the area dry with a clean towel. You do not need a bandage unless otherwise instructed. Do not apply any ointments or creams to your incision. You may have skin glue on your incision. Do not peel it off. It will come off on its own in about one week. Your arm may swell a bit after surgery. To reduce swelling use pillows to elevate your arm so it is above your heart. Your doctor will tell you if you need to lightly wrap your arm with an ACE bandage. ° °Diet ° °Resume your normal diet. There are not special food restrictions following this procedure. In order to heal from your surgery, it is CRITICAL to get adequate nutrition. Your body requires vitamins, minerals, and protein. Vegetables are the best source of vitamins and minerals. Vegetables also provide the perfect balance of protein. Processed food has little nutritional value, so try to avoid this. ° °Medications ° °Resume taking all of your medications. If your incision is causing pain, you may take over-the counter pain relievers such as acetaminophen (Tylenol). If you were prescribed a stronger pain medication, please be aware these medications can cause nausea and constipation. Prevent  nausea by taking the medication with a snack or meal. Avoid constipation by drinking plenty of fluids and eating foods with high amount of fiber, such as fruits, vegetables, and grains. Do not take Tylenol if you are taking prescription pain medications. ° ° ° ° °Follow up °Your surgeon may want to see you in the office following your access surgery. If so, this will be arranged at the time of your surgery. ° °Please call us immediately for any of the following conditions: ° °Increased pain, redness, drainage (pus) from your incision site °Fever of 101 degrees or higher °Severe or worsening pain at your incision site °Hand pain or numbness. ° °Reduce your risk of vascular disease: ° °Stop smoking. If you would like help, call [facility] at 1-800-QUIT-NOW (1-800-784-8669) or [place] at 336-586-4000 ° °Manage your cholesterol °Maintain a desired weight °Control your diabetes °Keep your blood pressure down ° °Dialysis ° °It will take several weeks to several months for your new dialysis access to be ready for use. Your surgeon will determine when it is OK to use it. Your nephrologist will continue to direct your dialysis. You can continue to use your Permcath until your new access is ready for use. ° °If you have any questions, please call the office at [phone]. ° °

## 2019-05-14 DIAGNOSIS — I0981 Rheumatic heart failure: Secondary | ICD-10-CM | POA: Diagnosis not present

## 2019-05-14 DIAGNOSIS — I5023 Acute on chronic systolic (congestive) heart failure: Secondary | ICD-10-CM | POA: Diagnosis not present

## 2019-05-14 DIAGNOSIS — I2510 Atherosclerotic heart disease of native coronary artery without angina pectoris: Secondary | ICD-10-CM | POA: Diagnosis not present

## 2019-05-14 DIAGNOSIS — I132 Hypertensive heart and chronic kidney disease with heart failure and with stage 5 chronic kidney disease, or end stage renal disease: Secondary | ICD-10-CM | POA: Diagnosis not present

## 2019-05-14 DIAGNOSIS — N185 Chronic kidney disease, stage 5: Secondary | ICD-10-CM | POA: Diagnosis not present

## 2019-05-14 DIAGNOSIS — I051 Rheumatic mitral insufficiency: Secondary | ICD-10-CM | POA: Diagnosis not present

## 2019-05-15 DIAGNOSIS — N2581 Secondary hyperparathyroidism of renal origin: Secondary | ICD-10-CM | POA: Diagnosis not present

## 2019-05-15 DIAGNOSIS — D509 Iron deficiency anemia, unspecified: Secondary | ICD-10-CM | POA: Diagnosis not present

## 2019-05-15 DIAGNOSIS — N186 End stage renal disease: Secondary | ICD-10-CM | POA: Diagnosis not present

## 2019-05-15 DIAGNOSIS — Z992 Dependence on renal dialysis: Secondary | ICD-10-CM | POA: Diagnosis not present

## 2019-05-15 DIAGNOSIS — D631 Anemia in chronic kidney disease: Secondary | ICD-10-CM | POA: Diagnosis not present

## 2019-05-17 DIAGNOSIS — I2510 Atherosclerotic heart disease of native coronary artery without angina pectoris: Secondary | ICD-10-CM | POA: Diagnosis not present

## 2019-05-17 DIAGNOSIS — I132 Hypertensive heart and chronic kidney disease with heart failure and with stage 5 chronic kidney disease, or end stage renal disease: Secondary | ICD-10-CM | POA: Diagnosis not present

## 2019-05-17 DIAGNOSIS — I0981 Rheumatic heart failure: Secondary | ICD-10-CM | POA: Diagnosis not present

## 2019-05-17 DIAGNOSIS — I5023 Acute on chronic systolic (congestive) heart failure: Secondary | ICD-10-CM | POA: Diagnosis not present

## 2019-05-17 DIAGNOSIS — I051 Rheumatic mitral insufficiency: Secondary | ICD-10-CM | POA: Diagnosis not present

## 2019-05-17 DIAGNOSIS — N185 Chronic kidney disease, stage 5: Secondary | ICD-10-CM | POA: Diagnosis not present

## 2019-05-18 DIAGNOSIS — N2581 Secondary hyperparathyroidism of renal origin: Secondary | ICD-10-CM | POA: Diagnosis not present

## 2019-05-18 DIAGNOSIS — D631 Anemia in chronic kidney disease: Secondary | ICD-10-CM | POA: Diagnosis not present

## 2019-05-18 DIAGNOSIS — N186 End stage renal disease: Secondary | ICD-10-CM | POA: Diagnosis not present

## 2019-05-18 DIAGNOSIS — D509 Iron deficiency anemia, unspecified: Secondary | ICD-10-CM | POA: Diagnosis not present

## 2019-05-18 DIAGNOSIS — Z992 Dependence on renal dialysis: Secondary | ICD-10-CM | POA: Diagnosis not present

## 2019-05-19 DIAGNOSIS — Z6836 Body mass index (BMI) 36.0-36.9, adult: Secondary | ICD-10-CM | POA: Diagnosis not present

## 2019-05-19 DIAGNOSIS — Z09 Encounter for follow-up examination after completed treatment for conditions other than malignant neoplasm: Secondary | ICD-10-CM | POA: Diagnosis not present

## 2019-05-19 DIAGNOSIS — Z992 Dependence on renal dialysis: Secondary | ICD-10-CM | POA: Diagnosis not present

## 2019-05-19 DIAGNOSIS — I429 Cardiomyopathy, unspecified: Secondary | ICD-10-CM | POA: Diagnosis not present

## 2019-05-19 DIAGNOSIS — Z299 Encounter for prophylactic measures, unspecified: Secondary | ICD-10-CM | POA: Diagnosis not present

## 2019-05-19 DIAGNOSIS — I509 Heart failure, unspecified: Secondary | ICD-10-CM | POA: Diagnosis not present

## 2019-05-20 DIAGNOSIS — N186 End stage renal disease: Secondary | ICD-10-CM | POA: Diagnosis not present

## 2019-05-20 DIAGNOSIS — D631 Anemia in chronic kidney disease: Secondary | ICD-10-CM | POA: Diagnosis not present

## 2019-05-20 DIAGNOSIS — I0981 Rheumatic heart failure: Secondary | ICD-10-CM | POA: Diagnosis not present

## 2019-05-20 DIAGNOSIS — D509 Iron deficiency anemia, unspecified: Secondary | ICD-10-CM | POA: Diagnosis not present

## 2019-05-20 DIAGNOSIS — I2510 Atherosclerotic heart disease of native coronary artery without angina pectoris: Secondary | ICD-10-CM | POA: Diagnosis not present

## 2019-05-20 DIAGNOSIS — N185 Chronic kidney disease, stage 5: Secondary | ICD-10-CM | POA: Diagnosis not present

## 2019-05-20 DIAGNOSIS — I132 Hypertensive heart and chronic kidney disease with heart failure and with stage 5 chronic kidney disease, or end stage renal disease: Secondary | ICD-10-CM | POA: Diagnosis not present

## 2019-05-20 DIAGNOSIS — I051 Rheumatic mitral insufficiency: Secondary | ICD-10-CM | POA: Diagnosis not present

## 2019-05-20 DIAGNOSIS — N2581 Secondary hyperparathyroidism of renal origin: Secondary | ICD-10-CM | POA: Diagnosis not present

## 2019-05-20 DIAGNOSIS — I5023 Acute on chronic systolic (congestive) heart failure: Secondary | ICD-10-CM | POA: Diagnosis not present

## 2019-05-20 DIAGNOSIS — Z992 Dependence on renal dialysis: Secondary | ICD-10-CM | POA: Diagnosis not present

## 2019-05-22 DIAGNOSIS — N186 End stage renal disease: Secondary | ICD-10-CM | POA: Diagnosis not present

## 2019-05-22 DIAGNOSIS — D631 Anemia in chronic kidney disease: Secondary | ICD-10-CM | POA: Diagnosis not present

## 2019-05-22 DIAGNOSIS — N2581 Secondary hyperparathyroidism of renal origin: Secondary | ICD-10-CM | POA: Diagnosis not present

## 2019-05-22 DIAGNOSIS — D509 Iron deficiency anemia, unspecified: Secondary | ICD-10-CM | POA: Diagnosis not present

## 2019-05-22 DIAGNOSIS — Z992 Dependence on renal dialysis: Secondary | ICD-10-CM | POA: Diagnosis not present

## 2019-05-24 DIAGNOSIS — I0981 Rheumatic heart failure: Secondary | ICD-10-CM | POA: Diagnosis not present

## 2019-05-24 DIAGNOSIS — I2510 Atherosclerotic heart disease of native coronary artery without angina pectoris: Secondary | ICD-10-CM | POA: Diagnosis not present

## 2019-05-24 DIAGNOSIS — I5023 Acute on chronic systolic (congestive) heart failure: Secondary | ICD-10-CM | POA: Diagnosis not present

## 2019-05-24 DIAGNOSIS — N185 Chronic kidney disease, stage 5: Secondary | ICD-10-CM | POA: Diagnosis not present

## 2019-05-24 DIAGNOSIS — I132 Hypertensive heart and chronic kidney disease with heart failure and with stage 5 chronic kidney disease, or end stage renal disease: Secondary | ICD-10-CM | POA: Diagnosis not present

## 2019-05-24 DIAGNOSIS — I051 Rheumatic mitral insufficiency: Secondary | ICD-10-CM | POA: Diagnosis not present

## 2019-05-25 DIAGNOSIS — N2581 Secondary hyperparathyroidism of renal origin: Secondary | ICD-10-CM | POA: Diagnosis not present

## 2019-05-25 DIAGNOSIS — N186 End stage renal disease: Secondary | ICD-10-CM | POA: Diagnosis not present

## 2019-05-25 DIAGNOSIS — D631 Anemia in chronic kidney disease: Secondary | ICD-10-CM | POA: Diagnosis not present

## 2019-05-25 DIAGNOSIS — D509 Iron deficiency anemia, unspecified: Secondary | ICD-10-CM | POA: Diagnosis not present

## 2019-05-25 DIAGNOSIS — Z992 Dependence on renal dialysis: Secondary | ICD-10-CM | POA: Diagnosis not present

## 2019-05-26 DIAGNOSIS — I132 Hypertensive heart and chronic kidney disease with heart failure and with stage 5 chronic kidney disease, or end stage renal disease: Secondary | ICD-10-CM | POA: Diagnosis not present

## 2019-05-26 DIAGNOSIS — I0981 Rheumatic heart failure: Secondary | ICD-10-CM | POA: Diagnosis not present

## 2019-05-26 DIAGNOSIS — I5023 Acute on chronic systolic (congestive) heart failure: Secondary | ICD-10-CM | POA: Diagnosis not present

## 2019-05-26 DIAGNOSIS — N185 Chronic kidney disease, stage 5: Secondary | ICD-10-CM | POA: Diagnosis not present

## 2019-05-26 DIAGNOSIS — I2510 Atherosclerotic heart disease of native coronary artery without angina pectoris: Secondary | ICD-10-CM | POA: Diagnosis not present

## 2019-05-26 DIAGNOSIS — I051 Rheumatic mitral insufficiency: Secondary | ICD-10-CM | POA: Diagnosis not present

## 2019-05-27 DIAGNOSIS — I051 Rheumatic mitral insufficiency: Secondary | ICD-10-CM | POA: Diagnosis not present

## 2019-05-27 DIAGNOSIS — N186 End stage renal disease: Secondary | ICD-10-CM | POA: Diagnosis not present

## 2019-05-27 DIAGNOSIS — D631 Anemia in chronic kidney disease: Secondary | ICD-10-CM | POA: Diagnosis not present

## 2019-05-27 DIAGNOSIS — N185 Chronic kidney disease, stage 5: Secondary | ICD-10-CM | POA: Diagnosis not present

## 2019-05-27 DIAGNOSIS — I5023 Acute on chronic systolic (congestive) heart failure: Secondary | ICD-10-CM | POA: Diagnosis not present

## 2019-05-27 DIAGNOSIS — D509 Iron deficiency anemia, unspecified: Secondary | ICD-10-CM | POA: Diagnosis not present

## 2019-05-27 DIAGNOSIS — I2510 Atherosclerotic heart disease of native coronary artery without angina pectoris: Secondary | ICD-10-CM | POA: Diagnosis not present

## 2019-05-27 DIAGNOSIS — I0981 Rheumatic heart failure: Secondary | ICD-10-CM | POA: Diagnosis not present

## 2019-05-27 DIAGNOSIS — I132 Hypertensive heart and chronic kidney disease with heart failure and with stage 5 chronic kidney disease, or end stage renal disease: Secondary | ICD-10-CM | POA: Diagnosis not present

## 2019-05-27 DIAGNOSIS — Z992 Dependence on renal dialysis: Secondary | ICD-10-CM | POA: Diagnosis not present

## 2019-05-27 DIAGNOSIS — N2581 Secondary hyperparathyroidism of renal origin: Secondary | ICD-10-CM | POA: Diagnosis not present

## 2019-05-29 DIAGNOSIS — D631 Anemia in chronic kidney disease: Secondary | ICD-10-CM | POA: Diagnosis not present

## 2019-05-29 DIAGNOSIS — N186 End stage renal disease: Secondary | ICD-10-CM | POA: Diagnosis not present

## 2019-05-29 DIAGNOSIS — Z992 Dependence on renal dialysis: Secondary | ICD-10-CM | POA: Diagnosis not present

## 2019-05-29 DIAGNOSIS — D509 Iron deficiency anemia, unspecified: Secondary | ICD-10-CM | POA: Diagnosis not present

## 2019-05-29 DIAGNOSIS — N2581 Secondary hyperparathyroidism of renal origin: Secondary | ICD-10-CM | POA: Diagnosis not present

## 2019-05-31 DIAGNOSIS — N185 Chronic kidney disease, stage 5: Secondary | ICD-10-CM | POA: Diagnosis not present

## 2019-05-31 DIAGNOSIS — I5023 Acute on chronic systolic (congestive) heart failure: Secondary | ICD-10-CM | POA: Diagnosis not present

## 2019-05-31 DIAGNOSIS — I2510 Atherosclerotic heart disease of native coronary artery without angina pectoris: Secondary | ICD-10-CM | POA: Diagnosis not present

## 2019-05-31 DIAGNOSIS — I051 Rheumatic mitral insufficiency: Secondary | ICD-10-CM | POA: Diagnosis not present

## 2019-05-31 DIAGNOSIS — I132 Hypertensive heart and chronic kidney disease with heart failure and with stage 5 chronic kidney disease, or end stage renal disease: Secondary | ICD-10-CM | POA: Diagnosis not present

## 2019-05-31 DIAGNOSIS — I0981 Rheumatic heart failure: Secondary | ICD-10-CM | POA: Diagnosis not present

## 2019-06-01 DIAGNOSIS — N2581 Secondary hyperparathyroidism of renal origin: Secondary | ICD-10-CM | POA: Diagnosis not present

## 2019-06-01 DIAGNOSIS — Z992 Dependence on renal dialysis: Secondary | ICD-10-CM | POA: Diagnosis not present

## 2019-06-01 DIAGNOSIS — D631 Anemia in chronic kidney disease: Secondary | ICD-10-CM | POA: Diagnosis not present

## 2019-06-01 DIAGNOSIS — D509 Iron deficiency anemia, unspecified: Secondary | ICD-10-CM | POA: Diagnosis not present

## 2019-06-01 DIAGNOSIS — N186 End stage renal disease: Secondary | ICD-10-CM | POA: Diagnosis not present

## 2019-06-02 DIAGNOSIS — N186 End stage renal disease: Secondary | ICD-10-CM | POA: Diagnosis not present

## 2019-06-03 ENCOUNTER — Telehealth (HOSPITAL_COMMUNITY): Payer: Self-pay

## 2019-06-03 DIAGNOSIS — I2510 Atherosclerotic heart disease of native coronary artery without angina pectoris: Secondary | ICD-10-CM | POA: Diagnosis not present

## 2019-06-03 DIAGNOSIS — I051 Rheumatic mitral insufficiency: Secondary | ICD-10-CM | POA: Diagnosis not present

## 2019-06-03 DIAGNOSIS — I132 Hypertensive heart and chronic kidney disease with heart failure and with stage 5 chronic kidney disease, or end stage renal disease: Secondary | ICD-10-CM | POA: Diagnosis not present

## 2019-06-03 DIAGNOSIS — D696 Thrombocytopenia, unspecified: Secondary | ICD-10-CM | POA: Diagnosis not present

## 2019-06-03 DIAGNOSIS — N185 Chronic kidney disease, stage 5: Secondary | ICD-10-CM | POA: Diagnosis not present

## 2019-06-03 DIAGNOSIS — I0981 Rheumatic heart failure: Secondary | ICD-10-CM | POA: Diagnosis not present

## 2019-06-03 DIAGNOSIS — I5023 Acute on chronic systolic (congestive) heart failure: Secondary | ICD-10-CM | POA: Diagnosis not present

## 2019-06-03 DIAGNOSIS — N186 End stage renal disease: Secondary | ICD-10-CM | POA: Diagnosis not present

## 2019-06-03 DIAGNOSIS — N2581 Secondary hyperparathyroidism of renal origin: Secondary | ICD-10-CM | POA: Diagnosis not present

## 2019-06-03 DIAGNOSIS — E878 Other disorders of electrolyte and fluid balance, not elsewhere classified: Secondary | ICD-10-CM | POA: Diagnosis not present

## 2019-06-03 DIAGNOSIS — D509 Iron deficiency anemia, unspecified: Secondary | ICD-10-CM | POA: Diagnosis not present

## 2019-06-03 DIAGNOSIS — D631 Anemia in chronic kidney disease: Secondary | ICD-10-CM | POA: Diagnosis not present

## 2019-06-03 DIAGNOSIS — E46 Unspecified protein-calorie malnutrition: Secondary | ICD-10-CM | POA: Diagnosis not present

## 2019-06-03 DIAGNOSIS — Z992 Dependence on renal dialysis: Secondary | ICD-10-CM | POA: Diagnosis not present

## 2019-06-03 NOTE — Telephone Encounter (Signed)

## 2019-06-05 DIAGNOSIS — D631 Anemia in chronic kidney disease: Secondary | ICD-10-CM | POA: Diagnosis not present

## 2019-06-05 DIAGNOSIS — N186 End stage renal disease: Secondary | ICD-10-CM | POA: Diagnosis not present

## 2019-06-05 DIAGNOSIS — Z992 Dependence on renal dialysis: Secondary | ICD-10-CM | POA: Diagnosis not present

## 2019-06-05 DIAGNOSIS — D509 Iron deficiency anemia, unspecified: Secondary | ICD-10-CM | POA: Diagnosis not present

## 2019-06-05 DIAGNOSIS — N2581 Secondary hyperparathyroidism of renal origin: Secondary | ICD-10-CM | POA: Diagnosis not present

## 2019-06-07 ENCOUNTER — Other Ambulatory Visit: Payer: Self-pay

## 2019-06-07 ENCOUNTER — Ambulatory Visit (INDEPENDENT_AMBULATORY_CARE_PROVIDER_SITE_OTHER): Payer: Self-pay | Admitting: Physician Assistant

## 2019-06-07 VITALS — BP 112/72 | HR 64 | Temp 97.3°F | Resp 20 | Ht 64.0 in | Wt 200.0 lb

## 2019-06-07 DIAGNOSIS — Z992 Dependence on renal dialysis: Secondary | ICD-10-CM

## 2019-06-07 DIAGNOSIS — N186 End stage renal disease: Secondary | ICD-10-CM

## 2019-06-07 NOTE — Progress Notes (Signed)
POST OPERATIVE OFFICE NOTE    CC:  F/u for surgery  HPI:  This is a 79 y.o. female who is s/p revision of right arm BVT with transposition on 05/12/2019 by Dr. Donzetta Matters.  At the completion of the procedure, pt had a palpable radial pulse.  She presents today for follow up.  She denies signs or symptoms of steal syndrome.  She states her incisions are well healed.  She is dialyzing via R IJ TDC without complication.   The pt is on dialysis via right IJ TDC placed by IR on 02/01/2019.  Allergies  Allergen Reactions  . Bactrim [Sulfamethoxazole-Trimethoprim] Other (See Comments)    Flu like symptoms  . Cephalexin Rash    Rash to arms/legs    Current Outpatient Medications  Medication Sig Dispense Refill  . acetaminophen (TYLENOL) 500 MG tablet Take 1,000 mg by mouth at bedtime.    . ALPRAZolam (XANAX) 0.5 MG tablet Take 0.25 mg by mouth at bedtime as needed for anxiety or sleep.     [name] amiodarone (PACERONE) 200 MG tablet Take 200 mg by mouth daily.    [name] apixaban (ELIQUIS) 5 MG TABS tablet Take 1 tablet (5 mg total) by mouth 2 (two) times daily. 60 tablet 3  . carvedilol (COREG) 12.5 MG tablet Take 1 tablet (12.5 mg total) by mouth 2 (two) times daily. 90 tablet 3  . colchicine 0.6 MG tablet Take 0.3 mg by mouth at bedtime.    [name] HYDROcodone-acetaminophen (NORCO/VICODIN) 5-325 MG tablet Take 1 tablet by mouth every 4 (four) hours as needed for moderate pain. 10 tablet 0  . levothyroxine (SYNTHROID) 75 MCG tablet Take 75 mcg by mouth daily before breakfast.    . loperamide (IMODIUM) 1 MG/5ML solution Take 1 mg by mouth as needed for diarrhea or loose stools.    . multivitamin (RENA-VIT) TABS tablet Take 1 tablet by mouth at bedtime. (Patient not taking: Reported on 04/19/2019) 30 tablet 0  . sevelamer carbonate (RENVELA) 800 MG tablet Take 800 mg by mouth 3 (three) times daily with meals.    . sodium chloride (OCEAN) 0.65 % SOLN nasal spray Place 1 spray into both nostrils 4 (four) times daily  as needed for congestion.    . torsemide (DEMADEX) 20 MG tablet Take 1 tablet (20 mg total) by mouth 3 (three) times a week. Monday, Wednesday and Friday (Patient taking differently: Take 40 mg by mouth 4 (four) times a week. Monday, Wednesday, Friday, and Sunday) 15 tablet 5   No current facility-administered medications for this visit.     ROS:  See HPI  Physical Exam:  Vitals:   06/07/19 1107  BP: 112/72  Pulse: 64  Resp: 20  Temp: (!) 97.3 F (36.3 C)  SpO2: 97%     Incision:  Well healed incisions of R arm Extremities:  There is a palpable radial pulse.  Motor and sensory in tact.  There is a thrill/bruit present. Some edema around antecubital incision    Assessment/Plan:  This is a 79 y.o. female who is s/p: revision of right arm BVT with transposition on 05/12/2019 by Dr. Donzetta Matters.  -the pt does not have evidence of steal. -the fistula/graft can be used 06/22/19. -If pt has tunneled dialysis catheter and the access has been used successfully to the satisfaction of the dialysis center, the tunneled catheter can be removed at their discretion.   -the pt will follow up prn   Dagoberto Ligas, PA-C [facility] [phone]  Clinic MD:  Trula Slade

## 2019-06-08 DIAGNOSIS — N186 End stage renal disease: Secondary | ICD-10-CM | POA: Diagnosis not present

## 2019-06-08 DIAGNOSIS — Z992 Dependence on renal dialysis: Secondary | ICD-10-CM | POA: Diagnosis not present

## 2019-06-08 DIAGNOSIS — I5023 Acute on chronic systolic (congestive) heart failure: Secondary | ICD-10-CM | POA: Diagnosis not present

## 2019-06-08 DIAGNOSIS — I051 Rheumatic mitral insufficiency: Secondary | ICD-10-CM | POA: Diagnosis not present

## 2019-06-08 DIAGNOSIS — I2510 Atherosclerotic heart disease of native coronary artery without angina pectoris: Secondary | ICD-10-CM | POA: Diagnosis not present

## 2019-06-08 DIAGNOSIS — I0981 Rheumatic heart failure: Secondary | ICD-10-CM | POA: Diagnosis not present

## 2019-06-08 DIAGNOSIS — I132 Hypertensive heart and chronic kidney disease with heart failure and with stage 5 chronic kidney disease, or end stage renal disease: Secondary | ICD-10-CM | POA: Diagnosis not present

## 2019-06-08 DIAGNOSIS — D631 Anemia in chronic kidney disease: Secondary | ICD-10-CM | POA: Diagnosis not present

## 2019-06-08 DIAGNOSIS — N2581 Secondary hyperparathyroidism of renal origin: Secondary | ICD-10-CM | POA: Diagnosis not present

## 2019-06-08 DIAGNOSIS — D509 Iron deficiency anemia, unspecified: Secondary | ICD-10-CM | POA: Diagnosis not present

## 2019-06-08 DIAGNOSIS — N185 Chronic kidney disease, stage 5: Secondary | ICD-10-CM | POA: Diagnosis not present

## 2019-06-09 DIAGNOSIS — Z950 Presence of cardiac pacemaker: Secondary | ICD-10-CM | POA: Diagnosis not present

## 2019-06-09 DIAGNOSIS — I0981 Rheumatic heart failure: Secondary | ICD-10-CM | POA: Diagnosis not present

## 2019-06-09 DIAGNOSIS — M150 Primary generalized (osteo)arthritis: Secondary | ICD-10-CM | POA: Diagnosis not present

## 2019-06-09 DIAGNOSIS — M1030 Gout due to renal impairment, unspecified site: Secondary | ICD-10-CM | POA: Diagnosis not present

## 2019-06-09 DIAGNOSIS — I482 Chronic atrial fibrillation, unspecified: Secondary | ICD-10-CM | POA: Diagnosis not present

## 2019-06-09 DIAGNOSIS — S81802D Unspecified open wound, left lower leg, subsequent encounter: Secondary | ICD-10-CM | POA: Diagnosis not present

## 2019-06-09 DIAGNOSIS — Z992 Dependence on renal dialysis: Secondary | ICD-10-CM | POA: Diagnosis not present

## 2019-06-09 DIAGNOSIS — Z87891 Personal history of nicotine dependence: Secondary | ICD-10-CM | POA: Diagnosis not present

## 2019-06-09 DIAGNOSIS — I132 Hypertensive heart and chronic kidney disease with heart failure and with stage 5 chronic kidney disease, or end stage renal disease: Secondary | ICD-10-CM | POA: Diagnosis not present

## 2019-06-09 DIAGNOSIS — I701 Atherosclerosis of renal artery: Secondary | ICD-10-CM | POA: Diagnosis not present

## 2019-06-09 DIAGNOSIS — Z7901 Long term (current) use of anticoagulants: Secondary | ICD-10-CM | POA: Diagnosis not present

## 2019-06-09 DIAGNOSIS — N185 Chronic kidney disease, stage 5: Secondary | ICD-10-CM | POA: Diagnosis not present

## 2019-06-09 DIAGNOSIS — I5022 Chronic systolic (congestive) heart failure: Secondary | ICD-10-CM | POA: Diagnosis not present

## 2019-06-09 DIAGNOSIS — Z6836 Body mass index (BMI) 36.0-36.9, adult: Secondary | ICD-10-CM | POA: Diagnosis not present

## 2019-06-09 DIAGNOSIS — I2510 Atherosclerotic heart disease of native coronary artery without angina pectoris: Secondary | ICD-10-CM | POA: Diagnosis not present

## 2019-06-09 DIAGNOSIS — D638 Anemia in other chronic diseases classified elsewhere: Secondary | ICD-10-CM | POA: Diagnosis not present

## 2019-06-09 DIAGNOSIS — Z9981 Dependence on supplemental oxygen: Secondary | ICD-10-CM | POA: Diagnosis not present

## 2019-06-09 DIAGNOSIS — D509 Iron deficiency anemia, unspecified: Secondary | ICD-10-CM | POA: Diagnosis not present

## 2019-06-09 DIAGNOSIS — E039 Hypothyroidism, unspecified: Secondary | ICD-10-CM | POA: Diagnosis not present

## 2019-06-09 DIAGNOSIS — G4733 Obstructive sleep apnea (adult) (pediatric): Secondary | ICD-10-CM | POA: Diagnosis not present

## 2019-06-09 DIAGNOSIS — I051 Rheumatic mitral insufficiency: Secondary | ICD-10-CM | POA: Diagnosis not present

## 2019-06-09 DIAGNOSIS — E669 Obesity, unspecified: Secondary | ICD-10-CM | POA: Diagnosis not present

## 2019-06-09 DIAGNOSIS — I447 Left bundle-branch block, unspecified: Secondary | ICD-10-CM | POA: Diagnosis not present

## 2019-06-10 DIAGNOSIS — D509 Iron deficiency anemia, unspecified: Secondary | ICD-10-CM | POA: Diagnosis not present

## 2019-06-10 DIAGNOSIS — N186 End stage renal disease: Secondary | ICD-10-CM | POA: Diagnosis not present

## 2019-06-10 DIAGNOSIS — D631 Anemia in chronic kidney disease: Secondary | ICD-10-CM | POA: Diagnosis not present

## 2019-06-10 DIAGNOSIS — Z992 Dependence on renal dialysis: Secondary | ICD-10-CM | POA: Diagnosis not present

## 2019-06-10 DIAGNOSIS — N2581 Secondary hyperparathyroidism of renal origin: Secondary | ICD-10-CM | POA: Diagnosis not present

## 2019-06-11 DIAGNOSIS — I132 Hypertensive heart and chronic kidney disease with heart failure and with stage 5 chronic kidney disease, or end stage renal disease: Secondary | ICD-10-CM | POA: Diagnosis not present

## 2019-06-11 DIAGNOSIS — S81802D Unspecified open wound, left lower leg, subsequent encounter: Secondary | ICD-10-CM | POA: Diagnosis not present

## 2019-06-11 DIAGNOSIS — I5022 Chronic systolic (congestive) heart failure: Secondary | ICD-10-CM | POA: Diagnosis not present

## 2019-06-11 DIAGNOSIS — I2510 Atherosclerotic heart disease of native coronary artery without angina pectoris: Secondary | ICD-10-CM | POA: Diagnosis not present

## 2019-06-11 DIAGNOSIS — I0981 Rheumatic heart failure: Secondary | ICD-10-CM | POA: Diagnosis not present

## 2019-06-11 DIAGNOSIS — N185 Chronic kidney disease, stage 5: Secondary | ICD-10-CM | POA: Diagnosis not present

## 2019-06-12 DIAGNOSIS — N186 End stage renal disease: Secondary | ICD-10-CM | POA: Diagnosis not present

## 2019-06-12 DIAGNOSIS — N2581 Secondary hyperparathyroidism of renal origin: Secondary | ICD-10-CM | POA: Diagnosis not present

## 2019-06-12 DIAGNOSIS — D509 Iron deficiency anemia, unspecified: Secondary | ICD-10-CM | POA: Diagnosis not present

## 2019-06-12 DIAGNOSIS — D631 Anemia in chronic kidney disease: Secondary | ICD-10-CM | POA: Diagnosis not present

## 2019-06-12 DIAGNOSIS — Z992 Dependence on renal dialysis: Secondary | ICD-10-CM | POA: Diagnosis not present

## 2019-06-14 ENCOUNTER — Encounter (HOSPITAL_COMMUNITY): Payer: Medicare Other | Admitting: Cardiology

## 2019-06-14 ENCOUNTER — Other Ambulatory Visit (HOSPITAL_COMMUNITY): Payer: Self-pay

## 2019-06-14 DIAGNOSIS — I0981 Rheumatic heart failure: Secondary | ICD-10-CM | POA: Diagnosis not present

## 2019-06-14 DIAGNOSIS — S81802D Unspecified open wound, left lower leg, subsequent encounter: Secondary | ICD-10-CM | POA: Diagnosis not present

## 2019-06-14 DIAGNOSIS — I132 Hypertensive heart and chronic kidney disease with heart failure and with stage 5 chronic kidney disease, or end stage renal disease: Secondary | ICD-10-CM | POA: Diagnosis not present

## 2019-06-14 DIAGNOSIS — I5022 Chronic systolic (congestive) heart failure: Secondary | ICD-10-CM | POA: Diagnosis not present

## 2019-06-14 DIAGNOSIS — N185 Chronic kidney disease, stage 5: Secondary | ICD-10-CM | POA: Diagnosis not present

## 2019-06-14 DIAGNOSIS — I2510 Atherosclerotic heart disease of native coronary artery without angina pectoris: Secondary | ICD-10-CM | POA: Diagnosis not present

## 2019-06-14 MED ORDER — CARVEDILOL 12.5 MG PO TABS: 12.5000 mg | ORAL_TABLET | Freq: Two times a day (BID) | ORAL | 3 refills | Status: DC

## 2019-06-15 DIAGNOSIS — Z992 Dependence on renal dialysis: Secondary | ICD-10-CM | POA: Diagnosis not present

## 2019-06-15 DIAGNOSIS — D631 Anemia in chronic kidney disease: Secondary | ICD-10-CM | POA: Diagnosis not present

## 2019-06-15 DIAGNOSIS — N186 End stage renal disease: Secondary | ICD-10-CM | POA: Diagnosis not present

## 2019-06-15 DIAGNOSIS — D509 Iron deficiency anemia, unspecified: Secondary | ICD-10-CM | POA: Diagnosis not present

## 2019-06-15 DIAGNOSIS — N2581 Secondary hyperparathyroidism of renal origin: Secondary | ICD-10-CM | POA: Diagnosis not present

## 2019-06-16 DIAGNOSIS — I10 Essential (primary) hypertension: Secondary | ICD-10-CM | POA: Diagnosis not present

## 2019-06-16 DIAGNOSIS — I509 Heart failure, unspecified: Secondary | ICD-10-CM | POA: Diagnosis not present

## 2019-06-16 DIAGNOSIS — M159 Polyosteoarthritis, unspecified: Secondary | ICD-10-CM | POA: Diagnosis not present

## 2019-06-17 DIAGNOSIS — N2581 Secondary hyperparathyroidism of renal origin: Secondary | ICD-10-CM | POA: Diagnosis not present

## 2019-06-17 DIAGNOSIS — D631 Anemia in chronic kidney disease: Secondary | ICD-10-CM | POA: Diagnosis not present

## 2019-06-17 DIAGNOSIS — Z992 Dependence on renal dialysis: Secondary | ICD-10-CM | POA: Diagnosis not present

## 2019-06-17 DIAGNOSIS — D509 Iron deficiency anemia, unspecified: Secondary | ICD-10-CM | POA: Diagnosis not present

## 2019-06-17 DIAGNOSIS — N186 End stage renal disease: Secondary | ICD-10-CM | POA: Diagnosis not present

## 2019-06-18 ENCOUNTER — Encounter: Payer: Medicare Other | Admitting: Internal Medicine

## 2019-06-18 ENCOUNTER — Ambulatory Visit (INDEPENDENT_AMBULATORY_CARE_PROVIDER_SITE_OTHER): Payer: Medicare Other | Admitting: *Deleted

## 2019-06-18 DIAGNOSIS — I0981 Rheumatic heart failure: Secondary | ICD-10-CM | POA: Diagnosis not present

## 2019-06-18 DIAGNOSIS — I2510 Atherosclerotic heart disease of native coronary artery without angina pectoris: Secondary | ICD-10-CM | POA: Diagnosis not present

## 2019-06-18 DIAGNOSIS — I428 Other cardiomyopathies: Secondary | ICD-10-CM

## 2019-06-18 DIAGNOSIS — N185 Chronic kidney disease, stage 5: Secondary | ICD-10-CM | POA: Diagnosis not present

## 2019-06-18 DIAGNOSIS — I132 Hypertensive heart and chronic kidney disease with heart failure and with stage 5 chronic kidney disease, or end stage renal disease: Secondary | ICD-10-CM | POA: Diagnosis not present

## 2019-06-18 DIAGNOSIS — I5022 Chronic systolic (congestive) heart failure: Secondary | ICD-10-CM | POA: Diagnosis not present

## 2019-06-18 DIAGNOSIS — S81802D Unspecified open wound, left lower leg, subsequent encounter: Secondary | ICD-10-CM | POA: Diagnosis not present

## 2019-06-18 LAB — CUP PACEART REMOTE DEVICE CHECK
Battery Remaining Longevity: 97 mo
Battery Remaining Percentage: 95.5 %
Battery Voltage: 3.01 V
Brady Statistic AP VP Percent: 99 %
Brady Statistic AP VS Percent: 1 %
Brady Statistic AS VP Percent: 1 %
Brady Statistic AS VS Percent: 0 %
Brady Statistic RA Percent Paced: 11 %
Brady Statistic RV Percent Paced: 99 %
Date Time Interrogation Session: 20210416020012
Implantable Lead Implant Date: 20190719
Implantable Lead Implant Date: 20190719
Implantable Lead Location: 753859
Implantable Lead Location: 753860
Implantable Lead Model: 5076
Implantable Lead Model: 5076
Implantable Pulse Generator Implant Date: 20190719
Lead Channel Impedance Value: 360 Ohm
Lead Channel Impedance Value: 510 Ohm
Lead Channel Pacing Threshold Amplitude: 0.5 V
Lead Channel Pacing Threshold Amplitude: 1.5 V
Lead Channel Pacing Threshold Pulse Width: 0.4 ms
Lead Channel Pacing Threshold Pulse Width: 0.5 ms
Lead Channel Sensing Intrinsic Amplitude: 1.1 mV
Lead Channel Sensing Intrinsic Amplitude: 12 mV
Lead Channel Setting Pacing Amplitude: 2.5 V
Lead Channel Setting Pacing Amplitude: 2.5 V
Lead Channel Setting Pacing Pulse Width: 0.5 ms
Lead Channel Setting Sensing Sensitivity: 4 mV
Pulse Gen Model: 3222
Pulse Gen Serial Number: 9022392

## 2019-06-18 NOTE — Progress Notes (Signed)
PPM Remote  

## 2019-06-19 DIAGNOSIS — D631 Anemia in chronic kidney disease: Secondary | ICD-10-CM | POA: Diagnosis not present

## 2019-06-19 DIAGNOSIS — N186 End stage renal disease: Secondary | ICD-10-CM | POA: Diagnosis not present

## 2019-06-19 DIAGNOSIS — N2581 Secondary hyperparathyroidism of renal origin: Secondary | ICD-10-CM | POA: Diagnosis not present

## 2019-06-19 DIAGNOSIS — Z992 Dependence on renal dialysis: Secondary | ICD-10-CM | POA: Diagnosis not present

## 2019-06-19 DIAGNOSIS — D509 Iron deficiency anemia, unspecified: Secondary | ICD-10-CM | POA: Diagnosis not present

## 2019-06-21 DIAGNOSIS — N185 Chronic kidney disease, stage 5: Secondary | ICD-10-CM | POA: Diagnosis not present

## 2019-06-21 DIAGNOSIS — I0981 Rheumatic heart failure: Secondary | ICD-10-CM | POA: Diagnosis not present

## 2019-06-21 DIAGNOSIS — S81802D Unspecified open wound, left lower leg, subsequent encounter: Secondary | ICD-10-CM | POA: Diagnosis not present

## 2019-06-21 DIAGNOSIS — I2510 Atherosclerotic heart disease of native coronary artery without angina pectoris: Secondary | ICD-10-CM | POA: Diagnosis not present

## 2019-06-21 DIAGNOSIS — I132 Hypertensive heart and chronic kidney disease with heart failure and with stage 5 chronic kidney disease, or end stage renal disease: Secondary | ICD-10-CM | POA: Diagnosis not present

## 2019-06-21 DIAGNOSIS — I5022 Chronic systolic (congestive) heart failure: Secondary | ICD-10-CM | POA: Diagnosis not present

## 2019-06-22 DIAGNOSIS — D631 Anemia in chronic kidney disease: Secondary | ICD-10-CM | POA: Diagnosis not present

## 2019-06-22 DIAGNOSIS — N2581 Secondary hyperparathyroidism of renal origin: Secondary | ICD-10-CM | POA: Diagnosis not present

## 2019-06-22 DIAGNOSIS — N186 End stage renal disease: Secondary | ICD-10-CM | POA: Diagnosis not present

## 2019-06-22 DIAGNOSIS — D509 Iron deficiency anemia, unspecified: Secondary | ICD-10-CM | POA: Diagnosis not present

## 2019-06-22 DIAGNOSIS — Z992 Dependence on renal dialysis: Secondary | ICD-10-CM | POA: Diagnosis not present

## 2019-06-24 DIAGNOSIS — I0981 Rheumatic heart failure: Secondary | ICD-10-CM | POA: Diagnosis not present

## 2019-06-24 DIAGNOSIS — D509 Iron deficiency anemia, unspecified: Secondary | ICD-10-CM | POA: Diagnosis not present

## 2019-06-24 DIAGNOSIS — Z992 Dependence on renal dialysis: Secondary | ICD-10-CM | POA: Diagnosis not present

## 2019-06-24 DIAGNOSIS — N2581 Secondary hyperparathyroidism of renal origin: Secondary | ICD-10-CM | POA: Diagnosis not present

## 2019-06-24 DIAGNOSIS — I132 Hypertensive heart and chronic kidney disease with heart failure and with stage 5 chronic kidney disease, or end stage renal disease: Secondary | ICD-10-CM | POA: Diagnosis not present

## 2019-06-24 DIAGNOSIS — I2510 Atherosclerotic heart disease of native coronary artery without angina pectoris: Secondary | ICD-10-CM | POA: Diagnosis not present

## 2019-06-24 DIAGNOSIS — S81802D Unspecified open wound, left lower leg, subsequent encounter: Secondary | ICD-10-CM | POA: Diagnosis not present

## 2019-06-24 DIAGNOSIS — N185 Chronic kidney disease, stage 5: Secondary | ICD-10-CM | POA: Diagnosis not present

## 2019-06-24 DIAGNOSIS — I5022 Chronic systolic (congestive) heart failure: Secondary | ICD-10-CM | POA: Diagnosis not present

## 2019-06-24 DIAGNOSIS — N186 End stage renal disease: Secondary | ICD-10-CM | POA: Diagnosis not present

## 2019-06-24 DIAGNOSIS — D631 Anemia in chronic kidney disease: Secondary | ICD-10-CM | POA: Diagnosis not present

## 2019-06-26 DIAGNOSIS — N186 End stage renal disease: Secondary | ICD-10-CM | POA: Diagnosis not present

## 2019-06-26 DIAGNOSIS — N2581 Secondary hyperparathyroidism of renal origin: Secondary | ICD-10-CM | POA: Diagnosis not present

## 2019-06-26 DIAGNOSIS — Z992 Dependence on renal dialysis: Secondary | ICD-10-CM | POA: Diagnosis not present

## 2019-06-26 DIAGNOSIS — D631 Anemia in chronic kidney disease: Secondary | ICD-10-CM | POA: Diagnosis not present

## 2019-06-26 DIAGNOSIS — D509 Iron deficiency anemia, unspecified: Secondary | ICD-10-CM | POA: Diagnosis not present

## 2019-06-28 DIAGNOSIS — I2510 Atherosclerotic heart disease of native coronary artery without angina pectoris: Secondary | ICD-10-CM | POA: Diagnosis not present

## 2019-06-28 DIAGNOSIS — S81802D Unspecified open wound, left lower leg, subsequent encounter: Secondary | ICD-10-CM | POA: Diagnosis not present

## 2019-06-28 DIAGNOSIS — I132 Hypertensive heart and chronic kidney disease with heart failure and with stage 5 chronic kidney disease, or end stage renal disease: Secondary | ICD-10-CM | POA: Diagnosis not present

## 2019-06-28 DIAGNOSIS — N185 Chronic kidney disease, stage 5: Secondary | ICD-10-CM | POA: Diagnosis not present

## 2019-06-28 DIAGNOSIS — I0981 Rheumatic heart failure: Secondary | ICD-10-CM | POA: Diagnosis not present

## 2019-06-28 DIAGNOSIS — I5022 Chronic systolic (congestive) heart failure: Secondary | ICD-10-CM | POA: Diagnosis not present

## 2019-06-29 DIAGNOSIS — D631 Anemia in chronic kidney disease: Secondary | ICD-10-CM | POA: Diagnosis not present

## 2019-06-29 DIAGNOSIS — N2581 Secondary hyperparathyroidism of renal origin: Secondary | ICD-10-CM | POA: Diagnosis not present

## 2019-06-29 DIAGNOSIS — N186 End stage renal disease: Secondary | ICD-10-CM | POA: Diagnosis not present

## 2019-06-29 DIAGNOSIS — D509 Iron deficiency anemia, unspecified: Secondary | ICD-10-CM | POA: Diagnosis not present

## 2019-06-29 DIAGNOSIS — Z992 Dependence on renal dialysis: Secondary | ICD-10-CM | POA: Diagnosis not present

## 2019-07-01 DIAGNOSIS — I132 Hypertensive heart and chronic kidney disease with heart failure and with stage 5 chronic kidney disease, or end stage renal disease: Secondary | ICD-10-CM | POA: Diagnosis not present

## 2019-07-01 DIAGNOSIS — S81802D Unspecified open wound, left lower leg, subsequent encounter: Secondary | ICD-10-CM | POA: Diagnosis not present

## 2019-07-01 DIAGNOSIS — N186 End stage renal disease: Secondary | ICD-10-CM | POA: Diagnosis not present

## 2019-07-01 DIAGNOSIS — Z992 Dependence on renal dialysis: Secondary | ICD-10-CM | POA: Diagnosis not present

## 2019-07-01 DIAGNOSIS — I2510 Atherosclerotic heart disease of native coronary artery without angina pectoris: Secondary | ICD-10-CM | POA: Diagnosis not present

## 2019-07-01 DIAGNOSIS — N2581 Secondary hyperparathyroidism of renal origin: Secondary | ICD-10-CM | POA: Diagnosis not present

## 2019-07-01 DIAGNOSIS — N185 Chronic kidney disease, stage 5: Secondary | ICD-10-CM | POA: Diagnosis not present

## 2019-07-01 DIAGNOSIS — D509 Iron deficiency anemia, unspecified: Secondary | ICD-10-CM | POA: Diagnosis not present

## 2019-07-01 DIAGNOSIS — I0981 Rheumatic heart failure: Secondary | ICD-10-CM | POA: Diagnosis not present

## 2019-07-01 DIAGNOSIS — I5022 Chronic systolic (congestive) heart failure: Secondary | ICD-10-CM | POA: Diagnosis not present

## 2019-07-01 DIAGNOSIS — D631 Anemia in chronic kidney disease: Secondary | ICD-10-CM | POA: Diagnosis not present

## 2019-07-02 DIAGNOSIS — N186 End stage renal disease: Secondary | ICD-10-CM | POA: Diagnosis not present

## 2019-07-03 DIAGNOSIS — D631 Anemia in chronic kidney disease: Secondary | ICD-10-CM | POA: Diagnosis not present

## 2019-07-03 DIAGNOSIS — N2581 Secondary hyperparathyroidism of renal origin: Secondary | ICD-10-CM | POA: Diagnosis not present

## 2019-07-03 DIAGNOSIS — D509 Iron deficiency anemia, unspecified: Secondary | ICD-10-CM | POA: Diagnosis not present

## 2019-07-03 DIAGNOSIS — Z992 Dependence on renal dialysis: Secondary | ICD-10-CM | POA: Diagnosis not present

## 2019-07-03 DIAGNOSIS — N186 End stage renal disease: Secondary | ICD-10-CM | POA: Diagnosis not present

## 2019-07-03 DIAGNOSIS — D696 Thrombocytopenia, unspecified: Secondary | ICD-10-CM | POA: Diagnosis not present

## 2019-07-03 DIAGNOSIS — E878 Other disorders of electrolyte and fluid balance, not elsewhere classified: Secondary | ICD-10-CM | POA: Diagnosis not present

## 2019-07-03 DIAGNOSIS — E46 Unspecified protein-calorie malnutrition: Secondary | ICD-10-CM | POA: Diagnosis not present

## 2019-07-05 DIAGNOSIS — I2510 Atherosclerotic heart disease of native coronary artery without angina pectoris: Secondary | ICD-10-CM | POA: Diagnosis not present

## 2019-07-05 DIAGNOSIS — I5022 Chronic systolic (congestive) heart failure: Secondary | ICD-10-CM | POA: Diagnosis not present

## 2019-07-05 DIAGNOSIS — I132 Hypertensive heart and chronic kidney disease with heart failure and with stage 5 chronic kidney disease, or end stage renal disease: Secondary | ICD-10-CM | POA: Diagnosis not present

## 2019-07-05 DIAGNOSIS — I0981 Rheumatic heart failure: Secondary | ICD-10-CM | POA: Diagnosis not present

## 2019-07-05 DIAGNOSIS — S81802D Unspecified open wound, left lower leg, subsequent encounter: Secondary | ICD-10-CM | POA: Diagnosis not present

## 2019-07-05 DIAGNOSIS — N185 Chronic kidney disease, stage 5: Secondary | ICD-10-CM | POA: Diagnosis not present

## 2019-07-06 DIAGNOSIS — D631 Anemia in chronic kidney disease: Secondary | ICD-10-CM | POA: Diagnosis not present

## 2019-07-06 DIAGNOSIS — D509 Iron deficiency anemia, unspecified: Secondary | ICD-10-CM | POA: Diagnosis not present

## 2019-07-06 DIAGNOSIS — N186 End stage renal disease: Secondary | ICD-10-CM | POA: Diagnosis not present

## 2019-07-06 DIAGNOSIS — E46 Unspecified protein-calorie malnutrition: Secondary | ICD-10-CM | POA: Diagnosis not present

## 2019-07-06 DIAGNOSIS — N2581 Secondary hyperparathyroidism of renal origin: Secondary | ICD-10-CM | POA: Diagnosis not present

## 2019-07-06 DIAGNOSIS — Z992 Dependence on renal dialysis: Secondary | ICD-10-CM | POA: Diagnosis not present

## 2019-07-08 DIAGNOSIS — E46 Unspecified protein-calorie malnutrition: Secondary | ICD-10-CM | POA: Diagnosis not present

## 2019-07-08 DIAGNOSIS — Z992 Dependence on renal dialysis: Secondary | ICD-10-CM | POA: Diagnosis not present

## 2019-07-08 DIAGNOSIS — N2581 Secondary hyperparathyroidism of renal origin: Secondary | ICD-10-CM | POA: Diagnosis not present

## 2019-07-08 DIAGNOSIS — N186 End stage renal disease: Secondary | ICD-10-CM | POA: Diagnosis not present

## 2019-07-08 DIAGNOSIS — D631 Anemia in chronic kidney disease: Secondary | ICD-10-CM | POA: Diagnosis not present

## 2019-07-08 DIAGNOSIS — D509 Iron deficiency anemia, unspecified: Secondary | ICD-10-CM | POA: Diagnosis not present

## 2019-07-09 DIAGNOSIS — Z9981 Dependence on supplemental oxygen: Secondary | ICD-10-CM | POA: Diagnosis not present

## 2019-07-09 DIAGNOSIS — M150 Primary generalized (osteo)arthritis: Secondary | ICD-10-CM | POA: Diagnosis not present

## 2019-07-09 DIAGNOSIS — I701 Atherosclerosis of renal artery: Secondary | ICD-10-CM | POA: Diagnosis not present

## 2019-07-09 DIAGNOSIS — N185 Chronic kidney disease, stage 5: Secondary | ICD-10-CM | POA: Diagnosis not present

## 2019-07-09 DIAGNOSIS — S81802D Unspecified open wound, left lower leg, subsequent encounter: Secondary | ICD-10-CM | POA: Diagnosis not present

## 2019-07-09 DIAGNOSIS — Z87891 Personal history of nicotine dependence: Secondary | ICD-10-CM | POA: Diagnosis not present

## 2019-07-09 DIAGNOSIS — I5022 Chronic systolic (congestive) heart failure: Secondary | ICD-10-CM | POA: Diagnosis not present

## 2019-07-09 DIAGNOSIS — D638 Anemia in other chronic diseases classified elsewhere: Secondary | ICD-10-CM | POA: Diagnosis not present

## 2019-07-09 DIAGNOSIS — I0981 Rheumatic heart failure: Secondary | ICD-10-CM | POA: Diagnosis not present

## 2019-07-09 DIAGNOSIS — I132 Hypertensive heart and chronic kidney disease with heart failure and with stage 5 chronic kidney disease, or end stage renal disease: Secondary | ICD-10-CM | POA: Diagnosis not present

## 2019-07-09 DIAGNOSIS — Z6836 Body mass index (BMI) 36.0-36.9, adult: Secondary | ICD-10-CM | POA: Diagnosis not present

## 2019-07-09 DIAGNOSIS — I051 Rheumatic mitral insufficiency: Secondary | ICD-10-CM | POA: Diagnosis not present

## 2019-07-09 DIAGNOSIS — D509 Iron deficiency anemia, unspecified: Secondary | ICD-10-CM | POA: Diagnosis not present

## 2019-07-09 DIAGNOSIS — Z7901 Long term (current) use of anticoagulants: Secondary | ICD-10-CM | POA: Diagnosis not present

## 2019-07-09 DIAGNOSIS — G4733 Obstructive sleep apnea (adult) (pediatric): Secondary | ICD-10-CM | POA: Diagnosis not present

## 2019-07-09 DIAGNOSIS — I447 Left bundle-branch block, unspecified: Secondary | ICD-10-CM | POA: Diagnosis not present

## 2019-07-09 DIAGNOSIS — E039 Hypothyroidism, unspecified: Secondary | ICD-10-CM | POA: Diagnosis not present

## 2019-07-09 DIAGNOSIS — Z950 Presence of cardiac pacemaker: Secondary | ICD-10-CM | POA: Diagnosis not present

## 2019-07-09 DIAGNOSIS — E669 Obesity, unspecified: Secondary | ICD-10-CM | POA: Diagnosis not present

## 2019-07-09 DIAGNOSIS — M1030 Gout due to renal impairment, unspecified site: Secondary | ICD-10-CM | POA: Diagnosis not present

## 2019-07-09 DIAGNOSIS — Z992 Dependence on renal dialysis: Secondary | ICD-10-CM | POA: Diagnosis not present

## 2019-07-09 DIAGNOSIS — I2510 Atherosclerotic heart disease of native coronary artery without angina pectoris: Secondary | ICD-10-CM | POA: Diagnosis not present

## 2019-07-09 DIAGNOSIS — I482 Chronic atrial fibrillation, unspecified: Secondary | ICD-10-CM | POA: Diagnosis not present

## 2019-07-10 DIAGNOSIS — E46 Unspecified protein-calorie malnutrition: Secondary | ICD-10-CM | POA: Diagnosis not present

## 2019-07-10 DIAGNOSIS — Z992 Dependence on renal dialysis: Secondary | ICD-10-CM | POA: Diagnosis not present

## 2019-07-10 DIAGNOSIS — D509 Iron deficiency anemia, unspecified: Secondary | ICD-10-CM | POA: Diagnosis not present

## 2019-07-10 DIAGNOSIS — D631 Anemia in chronic kidney disease: Secondary | ICD-10-CM | POA: Diagnosis not present

## 2019-07-10 DIAGNOSIS — N186 End stage renal disease: Secondary | ICD-10-CM | POA: Diagnosis not present

## 2019-07-10 DIAGNOSIS — N2581 Secondary hyperparathyroidism of renal origin: Secondary | ICD-10-CM | POA: Diagnosis not present

## 2019-07-12 DIAGNOSIS — N185 Chronic kidney disease, stage 5: Secondary | ICD-10-CM | POA: Diagnosis not present

## 2019-07-12 DIAGNOSIS — I132 Hypertensive heart and chronic kidney disease with heart failure and with stage 5 chronic kidney disease, or end stage renal disease: Secondary | ICD-10-CM | POA: Diagnosis not present

## 2019-07-12 DIAGNOSIS — S81802D Unspecified open wound, left lower leg, subsequent encounter: Secondary | ICD-10-CM | POA: Diagnosis not present

## 2019-07-12 DIAGNOSIS — I5022 Chronic systolic (congestive) heart failure: Secondary | ICD-10-CM | POA: Diagnosis not present

## 2019-07-12 DIAGNOSIS — I2510 Atherosclerotic heart disease of native coronary artery without angina pectoris: Secondary | ICD-10-CM | POA: Diagnosis not present

## 2019-07-12 DIAGNOSIS — I0981 Rheumatic heart failure: Secondary | ICD-10-CM | POA: Diagnosis not present

## 2019-07-13 DIAGNOSIS — Z992 Dependence on renal dialysis: Secondary | ICD-10-CM | POA: Diagnosis not present

## 2019-07-13 DIAGNOSIS — D509 Iron deficiency anemia, unspecified: Secondary | ICD-10-CM | POA: Diagnosis not present

## 2019-07-13 DIAGNOSIS — E46 Unspecified protein-calorie malnutrition: Secondary | ICD-10-CM | POA: Diagnosis not present

## 2019-07-13 DIAGNOSIS — N2581 Secondary hyperparathyroidism of renal origin: Secondary | ICD-10-CM | POA: Diagnosis not present

## 2019-07-13 DIAGNOSIS — N186 End stage renal disease: Secondary | ICD-10-CM | POA: Diagnosis not present

## 2019-07-13 DIAGNOSIS — D631 Anemia in chronic kidney disease: Secondary | ICD-10-CM | POA: Diagnosis not present

## 2019-07-15 DIAGNOSIS — N186 End stage renal disease: Secondary | ICD-10-CM | POA: Diagnosis not present

## 2019-07-15 DIAGNOSIS — N2581 Secondary hyperparathyroidism of renal origin: Secondary | ICD-10-CM | POA: Diagnosis not present

## 2019-07-15 DIAGNOSIS — E46 Unspecified protein-calorie malnutrition: Secondary | ICD-10-CM | POA: Diagnosis not present

## 2019-07-15 DIAGNOSIS — D631 Anemia in chronic kidney disease: Secondary | ICD-10-CM | POA: Diagnosis not present

## 2019-07-15 DIAGNOSIS — Z992 Dependence on renal dialysis: Secondary | ICD-10-CM | POA: Diagnosis not present

## 2019-07-15 DIAGNOSIS — D509 Iron deficiency anemia, unspecified: Secondary | ICD-10-CM | POA: Diagnosis not present

## 2019-07-15 NOTE — Progress Notes (Deleted)
Cardiology Office Note Date:  07/15/2019  Patient ID:  Gina [name], Gina [name] Jan 28, 1941, MRN [id number] PCP:  Glenda Chroman, MD  Cardiologist:  Dr. Aundra Dubin EP: Dr. Lorenz Coaster  ***refresh   Chief Complaint: ***???  History of Present Illness: Gina [name] is a 79 y.o. female with history of anemia, ESRF now on on HD started Nov 2020, permanent afib > AVnode ablation, PPM, NICM (has had low output failure requiring inotrope in the past),  chronic CHF (systolic), LBBB, RAS, OSA w/CPAP, PVCs on amiodarone.  She was planned for device system upgrade to CRT 12/08/19, though her subclavian vein was occluded and procedure not pursued.  She saw Dr. Aundra Dubin 02/15/19, discussed plans for RA lead removal and LV vs Left bundle, HIS lead once tunneled cath out to reduce infection risk  She underwent Revision of right arm basilic vein fistula with transposition procedure 05/12/19, follow up with [facility] noted still dialyzing via R IJ TDC, was cleared to use the fistula 06/22/19  *** symptoms *** meds, CM *** amio labs *** ? Does she need upgrade, infection risk without tunneled catheter remains high with HD now. *** Eliquis, bleeding   Device information SJM dual chamber PPM implanted 09/19/17   Past Medical History:  Diagnosis Date  . Acute renal failure [facility])     hospitalized... December 08, 2009... improved with hydration in the hospital  . Anemia    further workup needed... October, 2011.  [name] Anxiety   . Aortic insufficiency    mild...echo... June 2 010  . Arthritis    Hips/Knees, severe, requiring a walker  . Asymmetric septal hypertrophy (HCC)    echo....04/2009....patient encouraged the family to be screened elsewhere  . Atrial fibrillation ([place])     ??? atrial fibrillation during hospitalization October, 2011 ???..  . Bradycardia   . CAD (coronary artery disease)    Mild coronary disease, catheterization, 2009  . Cardiomyopathy, nonischemic ([place])    EF improved October, 2011  . CHF  (congestive heart failure) ([place])   . Ejection fraction    EF 25%...nondiagnostic MRI December 2009...  /  echo June, 2010  /   echo... December 08, 2009.... ejection fraction improved... EF 60% /  EF 30%... echo.... [place]... December 28, 2009 with CHFPLanned ICD / CRT... patient has seen Dr.Klein..EF improved October, 2011   . ESRD (end-stage renal disease) due to SLE [facility])    Dialysis T/Th/Sa  . Groin hematoma    Right-hematoma/abscess..repaired.. December, 2009  . History of COVID-19   . Hypertension   . Hypothyroidism   . LBBB (left bundle branch block)   . LVH (left ventricular hypertrophy)    moderately severe... echo.. June, 2010  /  EF improved October, 2011.... cancel plans for ICD  . Mitral regurgitation    mild/ moderate...echo June, 2010  /   echo.. October, 2011.... mitral regurgitation  improved  . OSA (obstructive sleep apnea)    uses a cpap  . Presence of permanent cardiac pacemaker   . Renal artery stenosis (HCC)    80% upper left   . Systolic heart failure    chronic    Past Surgical History:  Procedure Laterality Date  . ABDOMINAL HYSTERECTOMY    . AV FISTULA PLACEMENT Right 02/04/2019   Procedure: ARTERIOVENOUS (AV) FISTULA CREATION;  Surgeon: Waynetta Sandy, MD;  Location: [place];  Service: Vascular;  Laterality: Right;  . AV NODE ABLATION N/A 09/19/2017   Procedure: AV NODE ABLATION;  Surgeon: Virl Axe  C, MD;  Location: [place] CV LAB;  Service: Cardiovascular;  Laterality: N/A;  . BASCILIC VEIN TRANSPOSITION Right 05/12/2019    2ND STAGE RIGHT UPPER ARM BASCILIC VEIN TRANSPOSITION (Right Arm Upper)  . BASCILIC VEIN TRANSPOSITION Right 05/12/2019   Procedure: 2ND STAGE RIGHT UPPER ARM [place];  Surgeon: Waynetta Sandy, MD;  Location: [place];  Service: Vascular;  Laterality: Right;  . BIV PACEMAKER INSERTION CRT-P N/A 09/19/2017   Procedure: BIV PACEMAKER INSERTION CRT-P;  Surgeon: Deboraha Sprang, MD;  Location:  [place] CV LAB;  Service: Cardiovascular;  Laterality: N/A;  . BIV PACEMAKER INSERTION CRT-P N/A 12/28/2018   Procedure: Upgrade to BIV PACEMAKER INSERTION CRT-P;  Surgeon: Evans Lance, MD;  Location: [place] CV LAB;  Service: Cardiovascular;  Laterality: N/A;  . CARDIAC CATHETERIZATION N/A 08/14/2015   Procedure: Right/Left Heart Cath and Coronary Angiography;  Surgeon: Leonie Man, MD;  Location: [place] CV LAB;  Service: Cardiovascular;  Laterality: N/A;  . CARDIOVERSION N/A 09/11/2017   Procedure: CARDIOVERSION;  Surgeon: Larey Dresser, MD;  Location[facility] ENDOSCOPY;  Service: Cardiovascular;  Laterality: N/A;  . CARDIOVERSION N/A 09/14/2017   Procedure: CARDIOVERSION;  Surgeon: Larey Dresser, MD;  Location: [place];  Service: Cardiovascular;  Laterality: N/A;  . CARDIOVERSION N/A 09/18/2017   Procedure: CARDIOVERSION FOR ATRIAL FIBRILLATION;  Surgeon: Larey Dresser, MD;  Location: [facility] ENDOSCOPY;  Service: Cardiovascular;  Laterality: N/A;  . IR FLUORO GUIDE CV LINE RIGHT  02/01/2019  . IR US GUIDE VASC ACCESS RIGHT  02/01/2019  . REPLACEMENT TOTAL HIP W/  RESURFACING IMPLANTS    . REPLACEMENT TOTAL KNEE    . Surgical Repair of a Catheteriztion associated femoral artery injury      Current Outpatient Medications  Medication Sig Dispense Refill  . acetaminophen (TYLENOL) 500 MG tablet Take 1,000 mg by mouth at bedtime.    . ALPRAZolam (XANAX) 0.5 MG tablet Take 0.25 mg by mouth at bedtime as needed for anxiety or sleep.     [name] amiodarone (PACERONE) 200 MG tablet Take 200 mg by mouth daily.    [name] apixaban (ELIQUIS) 5 MG TABS tablet Take 1 tablet (5 mg total) by mouth 2 (two) times daily. 60 tablet 3  . carvedilol (COREG) 12.5 MG tablet Take 1 tablet (12.5 mg total) by mouth 2 (two) times daily. 180 tablet 3  . colchicine 0.6 MG tablet Take 0.3 mg by mouth at bedtime.    . gabapentin (NEURONTIN) 100 MG capsule Take 100 mg by mouth 3 (three) times daily.    [name]  HYDROcodone-acetaminophen (NORCO/VICODIN) 5-325 MG tablet Take 1 tablet by mouth every 4 (four) hours as needed for moderate pain. (Patient not taking: Reported on 06/07/2019) 10 tablet 0  . levothyroxine (SYNTHROID) 75 MCG tablet Take 75 mcg by mouth daily before breakfast.    . loperamide (IMODIUM) 1 MG/5ML solution Take 1 mg by mouth as needed for diarrhea or loose stools.    . multivitamin (RENA-VIT) TABS tablet Take 1 tablet by mouth at bedtime. 30 tablet 0  . nabumetone (RELAFEN) 500 MG tablet Take 500 mg by mouth daily.    . sevelamer carbonate (RENVELA) 800 MG tablet Take 800 mg by mouth 3 (three) times daily with meals.    . sodium chloride (OCEAN) 0.65 % SOLN nasal spray Place 1 spray into both nostrils 4 (four) times daily as needed for congestion.    . torsemide (DEMADEX) 20 MG tablet Take 1 tablet (  20 mg total) by mouth 3 (three) times a week. Monday, Wednesday and Friday (Patient taking differently: Take 40 mg by mouth 4 (four) times a week. Monday, Wednesday, Friday, and Sunday) 15 tablet 5   No current facility-administered medications for this visit.    Allergies:   Bactrim [sulfamethoxazole-trimethoprim] and Cephalexin   Social History:  The patient  reports that she quit smoking about 33 years ago. Her smoking use included cigarettes. She started smoking about 60 years ago. She has a 24.00 pack-year smoking history. She has never used smokeless tobacco. She reports that she does not drink alcohol or use drugs.   Family History:  The patient's family history includes Alzheimer's disease in her mother; Bradycardia in her brother; Heart failure in her paternal grandfather and paternal grandmother; Rheumatic fever in her mother; Stomach cancer (age of onset: 52) in her father.  ROS:  Please see the history of present illness.  All other systems are reviewed and otherwise negative.   PHYSICAL EXAM: *** VS:  There were no vitals taken for this visit. BMI: There is no height or weight  on file to calculate BMI. Well nourished, well developed, in no acute distress  HEENT: normocephalic, atraumatic  Neck: no JVD, carotid bruits or masses Cardiac:  *** RRR; no significant murmurs, no rubs, or gallops Lungs:  *** CTA b/l, no wheezing, rhonchi or rales  Abd: soft, nontender MS: no deformity or *** atrophy Ext: *** no edema  Skin: warm and dry, no rash Neuro:  No gross deficits appreciated Psych: euthymic mood, full affect  *** PPM site is stable, no tethering or discomfort   EKG:  Done today shows ***   08/31/2018: TTE IMPRESSIONS  1. The left ventricle has a visually estimated ejection fraction of 20%.  The cavity size was mildly dilated. There is moderately increased left  ventricular wall thickness. Left ventricular diastolic Doppler parameters  are indeterminate. Left ventricular  diffuse hypokinesis.  2. Normal RV size with mildly decreased systolic function.  3. Left atrial size was severely dilated.  4. The mitral valve is degenerative. There is moderate mitral annular  calcification present. Mitral valve regurgitation is moderate by color  flow Doppler. No evidence of mitral valve stenosis.  5. The aortic valve is tricuspid. Mild calcification of the aortic valve.  Aortic valve regurgitation is trivial by color flow Doppler. No stenosis  of the aortic valve.  6. The aortic root is normal in size and structure.  7. There is mild dilatation of the ascending aorta measuring 41 mm.  8. The inferior vena cava was dilated in size with >50% respiratory  variability. PA systolic pressure 47 mmHg.  9. Trivial pericardial effusion is present.     08/14/2015: R/LHC 1. Angiographically normal coronary arteries 2. Nonischemic cardiomyopathy - conflicting cardiac outputs between Fick and thermodilution. Suspect that there is some valvular disease that is throwing off the thermodilution. 3. Essentially normal right heart pressures. Mildly elevated LVEDP and  PCWP, but otherwise normal pressures.    Clearly nonischemic cardiomyopathy. The patient's itching anatomy with a tandem LAD system and codominant.  Plan:  Discharge after bedrest following femoral sheath removal.  Continue current management. She appears euvolemic   Recent Labs: 01/27/2019: B Natriuretic Peptide >4,500.0; TSH 5.138 01/28/2019: ALT 99 02/06/2019: Magnesium 2.1; Platelets 195 05/12/2019: BUN 33; Creatinine, Ser 2.90; Hemoglobin 10.9; Potassium 3.4; Sodium 138  No results found for requested labs within last 8760 hours.   CrCl cannot be calculated (Patient's most recent  lab result is older than the maximum 21 days allowed.).   Wt Readings from Last 3 Encounters:  06/07/19 200 lb (90.7 kg)  05/12/19 200 lb 9.9 oz (91 kg)  04/09/19 206 lb (93.4 kg)     Other studies reviewed: Additional studies/records reviewed today include: summarized above  ASSESSMENT AND PLAN:  1. PPM     At time of initial implant, unable to place LV lead     AV node ablation     Pt brought back to lab by Dr. Lovena Le 12/28/18 to reattempts LV, HIS, or left bundle pacing though subclavian vein was occluded     ***  2. AFib     CHA2DS2Vasc is 4, on *** Eliquis, *** appropriately dosed     *** burden, s/p AVnode ablation  3. NICM     ***  4. PVCs     *** amiodarone  Disposition: F/u with ***  Current medicines are reviewed at length with the patient today.  The patient did not have any concerns regarding medicines.***  Signed, Tommye Standard, PA-C 07/15/2019 5:17 PM     [address] [place] [address] [phone] (office)  [phone] (fax)

## 2019-07-16 ENCOUNTER — Ambulatory Visit (HOSPITAL_COMMUNITY)
Admission: RE | Admit: 2019-07-16 | Discharge: 2019-07-16 | Disposition: A | Discharge status: Home / Self Care | Payer: Medicare Other | Source: Ambulatory Visit | Attending: Cardiology | Admitting: Cardiology

## 2019-07-16 ENCOUNTER — Encounter: Payer: Medicare Other | Admitting: Physician Assistant

## 2019-07-16 ENCOUNTER — Other Ambulatory Visit: Payer: Self-pay

## 2019-07-16 VITALS — BP 110/86 | HR 72

## 2019-07-16 DIAGNOSIS — I447 Left bundle-branch block, unspecified: Secondary | ICD-10-CM | POA: Diagnosis not present

## 2019-07-16 DIAGNOSIS — I493 Ventricular premature depolarization: Secondary | ICD-10-CM | POA: Diagnosis not present

## 2019-07-16 DIAGNOSIS — I701 Atherosclerosis of renal artery: Secondary | ICD-10-CM | POA: Diagnosis not present

## 2019-07-16 DIAGNOSIS — Z992 Dependence on renal dialysis: Secondary | ICD-10-CM | POA: Diagnosis not present

## 2019-07-16 DIAGNOSIS — Z79899 Other long term (current) drug therapy: Secondary | ICD-10-CM | POA: Insufficient documentation

## 2019-07-16 DIAGNOSIS — G4733 Obstructive sleep apnea (adult) (pediatric): Secondary | ICD-10-CM | POA: Diagnosis not present

## 2019-07-16 DIAGNOSIS — I4819 Other persistent atrial fibrillation: Secondary | ICD-10-CM | POA: Diagnosis not present

## 2019-07-16 DIAGNOSIS — I428 Other cardiomyopathies: Secondary | ICD-10-CM | POA: Diagnosis not present

## 2019-07-16 DIAGNOSIS — Z8249 Family history of ischemic heart disease and other diseases of the circulatory system: Secondary | ICD-10-CM | POA: Diagnosis not present

## 2019-07-16 DIAGNOSIS — N186 End stage renal disease: Secondary | ICD-10-CM | POA: Diagnosis not present

## 2019-07-16 DIAGNOSIS — Z87891 Personal history of nicotine dependence: Secondary | ICD-10-CM | POA: Diagnosis not present

## 2019-07-16 DIAGNOSIS — I5022 Chronic systolic (congestive) heart failure: Secondary | ICD-10-CM | POA: Insufficient documentation

## 2019-07-16 DIAGNOSIS — E039 Hypothyroidism, unspecified: Secondary | ICD-10-CM | POA: Diagnosis not present

## 2019-07-16 DIAGNOSIS — I132 Hypertensive heart and chronic kidney disease with heart failure and with stage 5 chronic kidney disease, or end stage renal disease: Secondary | ICD-10-CM | POA: Insufficient documentation

## 2019-07-16 DIAGNOSIS — I4821 Permanent atrial fibrillation: Secondary | ICD-10-CM | POA: Insufficient documentation

## 2019-07-16 DIAGNOSIS — I5042 Chronic combined systolic (congestive) and diastolic (congestive) heart failure: Secondary | ICD-10-CM

## 2019-07-16 DIAGNOSIS — Z7901 Long term (current) use of anticoagulants: Secondary | ICD-10-CM | POA: Diagnosis not present

## 2019-07-16 LAB — COMPREHENSIVE METABOLIC PANEL
ALT: 13 U/L (ref 0–44)
AST: 12 U/L — ABNORMAL LOW (ref 15–41)
Albumin: 3.6 g/dL (ref 3.5–5.0)
Alkaline Phosphatase: 64 U/L (ref 38–126)
Anion gap: 17 — ABNORMAL HIGH (ref 5–15)
BUN: 37 mg/dL — ABNORMAL HIGH (ref 8–23)
CO2: 27 mmol/L (ref 22–32)
Calcium: 9.4 mg/dL (ref 8.9–10.3)
Chloride: 95 mmol/L — ABNORMAL LOW (ref 98–111)
Creatinine, Ser: 3.71 mg/dL — ABNORMAL HIGH (ref 0.44–1.00)
GFR calc Af Amer: 13 mL/min — ABNORMAL LOW (ref >60)
GFR calc non Af Amer: 11 mL/min — ABNORMAL LOW (ref >60)
Glucose, Bld: 95 mg/dL (ref 70–99)
Potassium: 4.4 mmol/L (ref 3.5–5.1)
Sodium: 139 mmol/L (ref 135–145)
Total Bilirubin: 1.3 mg/dL — ABNORMAL HIGH (ref 0.3–1.2)
Total Protein: 6.2 g/dL — ABNORMAL LOW (ref 6.5–8.1)

## 2019-07-16 LAB — TSH: TSH: 3.612 u[IU]/mL (ref 0.350–4.500)

## 2019-07-16 NOTE — Patient Instructions (Signed)
Labs done today. We will contact you only if your labs are abnormal.   No medication changes were made. Please continue all current medications as prescribed.   Your physician recommends that you schedule a follow-up appointment in: 6 months. Please contact our office in October to schedule a November appointment.   Do the following things EVERYDAY: 1) Weigh yourself in the morning before breakfast. Write it down and keep it in a log. 2) Take your medicines as prescribed 3) Eat low salt foods--Limit salt (sodium) to 2000 mg per day.  4) Stay as active as you can everyday 5) Limit all fluids for the day to less than 2 liters   At the [facility], you and your health needs are our priority. As part of our continuing mission to provide you with exceptional heart care, we have created designated Provider Care Teams. These Care Teams include your primary Cardiologist (physician) and Advanced Practice Providers (APPs- Physician Assistants and Nurse Practitioners) who all work together to provide you with the care you need, when you need it.   You may see any of the following providers on your designated Care Team at your next follow up: [name] Dr Glori Bickers . Dr [name] . Darrick Grinder, NP . Lyda Jester, PA . Audry Riles, PharmD   Please be sure to bring in all your medications bottles to every appointment.

## 2019-07-17 DIAGNOSIS — D509 Iron deficiency anemia, unspecified: Secondary | ICD-10-CM | POA: Diagnosis not present

## 2019-07-17 DIAGNOSIS — D631 Anemia in chronic kidney disease: Secondary | ICD-10-CM | POA: Diagnosis not present

## 2019-07-17 DIAGNOSIS — N2581 Secondary hyperparathyroidism of renal origin: Secondary | ICD-10-CM | POA: Diagnosis not present

## 2019-07-17 DIAGNOSIS — N186 End stage renal disease: Secondary | ICD-10-CM | POA: Diagnosis not present

## 2019-07-17 DIAGNOSIS — E46 Unspecified protein-calorie malnutrition: Secondary | ICD-10-CM | POA: Diagnosis not present

## 2019-07-17 DIAGNOSIS — Z992 Dependence on renal dialysis: Secondary | ICD-10-CM | POA: Diagnosis not present

## 2019-07-18 NOTE — Progress Notes (Signed)
PCP: Dr Woody Seller  HF Cardiology: Dr Aundra Dubin  Neprology: Dr Hinda Lenis EP: Dr Caryl Comes   HPI: Gina [name] is a 79 y.o.female with h/o chronic systolic CHF, anemia, HTN, LBBB, renal artery stenosis, and OSA.   Admitted 09/02/2017 with peripheral edema and worsening SOB. Attempts at diuresis were difficult due to AKI. Transferred to [facility] 09/10/17 in setting of cardiogenic shock 2/2 Afib RVR. She required milrinone.  She underwent DCCV 7/11 and 7/14. Discussed with EP after she went back into Afib a third time. Underwent DCCV 7/18 and stayed in for NSR most of the day, but then went back into Afib. Underwent AV nodal ablation and St Jude PPM implantation 09/19/17 with Dr. Caryl Comes. Unable to place an LV lead. She was discharged to SNF. Echo in 7/19 with EF 20%.    Zio patch in 4/20 showed atrial fibrillation with average rate 73, rare PVCs and PACs.   Echo in 6/20 showed EF 20% with diffuse hypokinesis and mild dilation, moderate MR, mildly decreased RV systolic function.   Has been rising gradually over time. In 11/20, she was admitted with intractable volume overload and markedly high BUN/creatinine. Decision was made to start HD.  She is now getting regular HD in [place].  She has an AV fistula but is still dialyzing from her catheter.   She returns today for followup of CHF. She remains weak.  She has 24 hour home care.  She had COVID-19 in 12/20 and feels like she has not fully recovered.  Uses wheelchair most of the time, able to stand but cannot walk a significant distance. No lightheadedness.  No dyspnea but not very active. No chest pain.    St Jude device reviewed: Stable thoracic impedance, >99% RV pacing  Labs (1/20): K 3.3, creatinine 2.45 Labs (3/20): K 3.2, creatinine 2.64, LFTs normal Labs (6/20): LFTs, normal, TSH elevated, creatinine 3.87 Labs (12/20): hgb 10.4 Labs (5/21): hgb 10.5  PMH: 1. OSA: Uses CPAP.  2. Atrial fibrillation: permanent.  Now s/p AV nodal ablation with St Jude  PPM.  3. Chronic systolic CHF: Since 6160, nonischemic cardiomyopathy.  - LHC (2017): Normal coronaries.  - Echo (7/19): EF 20% with mild LV dilation and diffuse hypokinesis, mild RV dilation with mild to moderate systolic dysfunction.  - Low output HF in 7/19 requiring milrinone.  - Unable to place LV lead at time of AV nodal ablation and pacing.  - Echo (6/20): EF 20%, mild diffuse hypokinesis, mild dilation, mildly decreased RV systolic function with moderate MR, PASP 47.  4. ESRD 5. H/o LBBB 6. Renal artery stenosis: 80% on left.  7. Frequent PVCs - Zio patch in 4/20: Atrial fibrillation with average rate 73, rare PVCs and PACs.  8. COVID-19 12/20  Review of systems complete and found to be negative unless listed in HPI.   Social History   Socioeconomic History  . Marital status: Married    Spouse name: Not on file  . Number of children: 0  . Years of education: Not on file  . Highest education level: Not on file  Occupational History  . Not on file  Tobacco Use  . Smoking status: Former Smoker    Packs/day: 1.00    Years: 24.00    Pack years: 24.00    Types: Cigarettes    Start date: 10/29/1958    Quit date: 12/02/1985    Years since quitting: 33.6  . Smokeless tobacco: Never Used  Substance and Sexual Activity  . Alcohol use:  No    Alcohol/week: 0.0 standard drinks  . Drug use: Never  . Sexual activity: Not on file  Other Topics Concern  . Not on file  Social History Narrative  . Not on file   Social Determinants of Health   Financial Resource Strain:   . Difficulty of Paying Living Expenses:   Food Insecurity:   . Worried About Charity fundraiser in the Last Year:   . Arboriculturist in the Last Year:   Transportation Needs:   . Film/video editor (Medical):   [name] Lack of Transportation (Non-Medical):   Physical Activity:   . Days of Exercise per Week:   . Minutes of Exercise per Session:   Stress:   . Feeling of Stress :   Social Connections:   .  Frequency of Communication with Friends and Family:   . Frequency of Social Gatherings with Friends and Family:   . Attends Religious Services:   . Active Member of Clubs or Organizations:   . Attends Archivist Meetings:   [name] Marital Status:   Intimate Partner Violence:   . Fear of Current or Ex-Partner:   . Emotionally Abused:   [name] Physically Abused:   . Sexually Abused:     Family History  Problem Relation Age of Onset  . Rheumatic fever Mother        in her early 96's  . Alzheimer's disease Mother   . Stomach cancer Father 59       died 43  . Bradycardia Brother        has pacemaker  . Heart failure Paternal Grandmother   . Heart failure Paternal Grandfather     Current Outpatient Medications  Medication Sig Dispense Refill  . acetaminophen (TYLENOL) 500 MG tablet Take 1,000 mg by mouth at bedtime.    . ALPRAZolam (XANAX) 0.5 MG tablet Take 0.25 mg by mouth at bedtime as needed for anxiety or sleep.     [name] amiodarone (PACERONE) 200 MG tablet Take 200 mg by mouth daily.    [name] apixaban (ELIQUIS) 5 MG TABS tablet Take 1 tablet (5 mg total) by mouth 2 (two) times daily. 60 tablet 3  . carvedilol (COREG) 12.5 MG tablet Take 1 tablet (12.5 mg total) by mouth 2 (two) times daily. 180 tablet 3  . colchicine 0.6 MG tablet Take 0.3 mg by mouth at bedtime.    [name] levothyroxine (SYNTHROID) 75 MCG tablet Take 75 mcg by mouth daily before breakfast.    . lidocaine-prilocaine (EMLA) cream APPLY TO ACCESS SITE 30 MINUTES PRIOR TO DIALYSIS AND WRAP WITH SARAN WRAP    . loperamide (IMODIUM) 1 MG/5ML solution Take 1 mg by mouth as needed for diarrhea or loose stools.    . sevelamer carbonate (RENVELA) 800 MG tablet Take 800 mg by mouth 3 (three) times daily with meals.    . sodium chloride (OCEAN) 0.65 % SOLN nasal spray Place 1 spray into both nostrils 4 (four) times daily as needed for congestion.    . torsemide (DEMADEX) 20 MG tablet Take 1 tablet (20 mg total) by mouth 3 (three)  times a week. Monday, Wednesday and Friday (Patient taking differently: Take 40 mg by mouth 4 (four) times a week. Sunday Monday, Wednesday, Friday) 15 tablet 5   No current facility-administered medications for this encounter.    Vitals:   07/16/19 1148  BP: 110/86  Pulse: 72  SpO2: 95%   Wt Readings from Last 3 Encounters:  06/07/19 90.7 kg (200 lb)  05/12/19 91 kg (200 lb 9.9 oz)  04/09/19 93.4 kg (206 lb)   PHYSICAL EXAM: General: NAD Neck: No JVD, no thyromegaly or thyroid nodule.  Lungs: Clear to auscultation bilaterally with normal respiratory effort. CV: Nondisplaced PMI.  Heart regular S1/S2, no S3/S4, no murmur.  No peripheral edema.  No carotid bruit.  Normal pedal pulses.  Abdomen: Soft, nontender, no hepatosplenomegaly, no distention.  Skin: Intact without lesions or rashes.  Neurologic: Alert and oriented x 3.  Psych: Normal affect. Extremities: No clubbing or cyanosis.  HEENT: Normal.   ASSESSMENT & PLAN: 1. Chronic Systolic CHF: Echo (9/79) with EF 20%. Long-standing cardiomyopathy, nonischemic based on cath in 2017. 7/19 hospitalization withlow output HF likely triggered by atrial fibrillation/RVR.  She was started on milrinone.  DCCVs failed and she ultimately had AV nodal ablation with St Jude PPM placed (unable to place LV lead).  Echo in 6/20 showed EF 20%.  She is not volume overloaded on exam and Corvue shows stable thoracic impedance.  NYHA class III symptoms, limited primarily by weakness.   - Continue HD for volume control.  - She is on torsemide 40 mg on non-HD days and still makes some urine.   - Continue Coreg 12.5 mg bid.    - >99% RV pacing on device interrogation.  Plan at this point is for removal of RA lead and placement of LV lead once she is using AV fistula and has the dialysis catheter out (less infection risk).  She has the fistula but is not yet using regularly and the catheter has not been removed.  2. ESRD: Dialysis in [place].  3.  Atrial fibrillation: Chronic.  She is now s/p AV nodal ablation with St Jude PPM.  - Continue Eliquis 5 mg twice a day.  5. OSA - Continue CPAP qHS. 5. PVCs: She is on amiodarone for frequent PVCs (?contributing to cardiomyopathy).  Zio patch in 4/20 on amiodarone showed only rare PVCs.  - Continue amiodarone 200 mg daily.  Check LFTs and TSH.  She was told that she needs a regular eye exam.   Followup 4 months.   [name]   07/18/2019

## 2019-07-19 DIAGNOSIS — I2510 Atherosclerotic heart disease of native coronary artery without angina pectoris: Secondary | ICD-10-CM | POA: Diagnosis not present

## 2019-07-19 DIAGNOSIS — I132 Hypertensive heart and chronic kidney disease with heart failure and with stage 5 chronic kidney disease, or end stage renal disease: Secondary | ICD-10-CM | POA: Diagnosis not present

## 2019-07-19 DIAGNOSIS — S81802D Unspecified open wound, left lower leg, subsequent encounter: Secondary | ICD-10-CM | POA: Diagnosis not present

## 2019-07-19 DIAGNOSIS — I5022 Chronic systolic (congestive) heart failure: Secondary | ICD-10-CM | POA: Diagnosis not present

## 2019-07-19 DIAGNOSIS — N185 Chronic kidney disease, stage 5: Secondary | ICD-10-CM | POA: Diagnosis not present

## 2019-07-19 DIAGNOSIS — I0981 Rheumatic heart failure: Secondary | ICD-10-CM | POA: Diagnosis not present

## 2019-07-20 DIAGNOSIS — E46 Unspecified protein-calorie malnutrition: Secondary | ICD-10-CM | POA: Diagnosis not present

## 2019-07-20 DIAGNOSIS — N2581 Secondary hyperparathyroidism of renal origin: Secondary | ICD-10-CM | POA: Diagnosis not present

## 2019-07-20 DIAGNOSIS — D509 Iron deficiency anemia, unspecified: Secondary | ICD-10-CM | POA: Diagnosis not present

## 2019-07-20 DIAGNOSIS — Z992 Dependence on renal dialysis: Secondary | ICD-10-CM | POA: Diagnosis not present

## 2019-07-20 DIAGNOSIS — D631 Anemia in chronic kidney disease: Secondary | ICD-10-CM | POA: Diagnosis not present

## 2019-07-20 DIAGNOSIS — N186 End stage renal disease: Secondary | ICD-10-CM | POA: Diagnosis not present

## 2019-07-22 DIAGNOSIS — N2581 Secondary hyperparathyroidism of renal origin: Secondary | ICD-10-CM | POA: Diagnosis not present

## 2019-07-22 DIAGNOSIS — D509 Iron deficiency anemia, unspecified: Secondary | ICD-10-CM | POA: Diagnosis not present

## 2019-07-22 DIAGNOSIS — E46 Unspecified protein-calorie malnutrition: Secondary | ICD-10-CM | POA: Diagnosis not present

## 2019-07-22 DIAGNOSIS — D631 Anemia in chronic kidney disease: Secondary | ICD-10-CM | POA: Diagnosis not present

## 2019-07-22 DIAGNOSIS — N186 End stage renal disease: Secondary | ICD-10-CM | POA: Diagnosis not present

## 2019-07-22 DIAGNOSIS — Z992 Dependence on renal dialysis: Secondary | ICD-10-CM | POA: Diagnosis not present

## 2019-07-24 DIAGNOSIS — D509 Iron deficiency anemia, unspecified: Secondary | ICD-10-CM | POA: Diagnosis not present

## 2019-07-24 DIAGNOSIS — N186 End stage renal disease: Secondary | ICD-10-CM | POA: Diagnosis not present

## 2019-07-24 DIAGNOSIS — Z992 Dependence on renal dialysis: Secondary | ICD-10-CM | POA: Diagnosis not present

## 2019-07-24 DIAGNOSIS — D631 Anemia in chronic kidney disease: Secondary | ICD-10-CM | POA: Diagnosis not present

## 2019-07-24 DIAGNOSIS — E46 Unspecified protein-calorie malnutrition: Secondary | ICD-10-CM | POA: Diagnosis not present

## 2019-07-24 DIAGNOSIS — N2581 Secondary hyperparathyroidism of renal origin: Secondary | ICD-10-CM | POA: Diagnosis not present

## 2019-07-26 DIAGNOSIS — S81802D Unspecified open wound, left lower leg, subsequent encounter: Secondary | ICD-10-CM | POA: Diagnosis not present

## 2019-07-26 DIAGNOSIS — I5022 Chronic systolic (congestive) heart failure: Secondary | ICD-10-CM | POA: Diagnosis not present

## 2019-07-26 DIAGNOSIS — I132 Hypertensive heart and chronic kidney disease with heart failure and with stage 5 chronic kidney disease, or end stage renal disease: Secondary | ICD-10-CM | POA: Diagnosis not present

## 2019-07-26 DIAGNOSIS — N390 Urinary tract infection, site not specified: Secondary | ICD-10-CM | POA: Diagnosis not present

## 2019-07-26 DIAGNOSIS — N185 Chronic kidney disease, stage 5: Secondary | ICD-10-CM | POA: Diagnosis not present

## 2019-07-26 DIAGNOSIS — I0981 Rheumatic heart failure: Secondary | ICD-10-CM | POA: Diagnosis not present

## 2019-07-26 DIAGNOSIS — R319 Hematuria, unspecified: Secondary | ICD-10-CM | POA: Diagnosis not present

## 2019-07-26 DIAGNOSIS — I2510 Atherosclerotic heart disease of native coronary artery without angina pectoris: Secondary | ICD-10-CM | POA: Diagnosis not present

## 2019-07-27 DIAGNOSIS — N2581 Secondary hyperparathyroidism of renal origin: Secondary | ICD-10-CM | POA: Diagnosis not present

## 2019-07-27 DIAGNOSIS — E46 Unspecified protein-calorie malnutrition: Secondary | ICD-10-CM | POA: Diagnosis not present

## 2019-07-27 DIAGNOSIS — Z992 Dependence on renal dialysis: Secondary | ICD-10-CM | POA: Diagnosis not present

## 2019-07-27 DIAGNOSIS — D509 Iron deficiency anemia, unspecified: Secondary | ICD-10-CM | POA: Diagnosis not present

## 2019-07-27 DIAGNOSIS — D631 Anemia in chronic kidney disease: Secondary | ICD-10-CM | POA: Diagnosis not present

## 2019-07-27 DIAGNOSIS — N186 End stage renal disease: Secondary | ICD-10-CM | POA: Diagnosis not present

## 2019-07-28 DIAGNOSIS — Z299 Encounter for prophylactic measures, unspecified: Secondary | ICD-10-CM | POA: Diagnosis not present

## 2019-07-28 DIAGNOSIS — Z992 Dependence on renal dialysis: Secondary | ICD-10-CM | POA: Diagnosis not present

## 2019-07-28 DIAGNOSIS — N390 Urinary tract infection, site not specified: Secondary | ICD-10-CM | POA: Diagnosis not present

## 2019-07-28 DIAGNOSIS — N186 End stage renal disease: Secondary | ICD-10-CM | POA: Diagnosis not present

## 2019-07-28 DIAGNOSIS — R300 Dysuria: Secondary | ICD-10-CM | POA: Diagnosis not present

## 2019-07-28 DIAGNOSIS — Z6836 Body mass index (BMI) 36.0-36.9, adult: Secondary | ICD-10-CM | POA: Diagnosis not present

## 2019-07-29 DIAGNOSIS — D509 Iron deficiency anemia, unspecified: Secondary | ICD-10-CM | POA: Diagnosis not present

## 2019-07-29 DIAGNOSIS — N2581 Secondary hyperparathyroidism of renal origin: Secondary | ICD-10-CM | POA: Diagnosis not present

## 2019-07-29 DIAGNOSIS — N186 End stage renal disease: Secondary | ICD-10-CM | POA: Diagnosis not present

## 2019-07-29 DIAGNOSIS — Z992 Dependence on renal dialysis: Secondary | ICD-10-CM | POA: Diagnosis not present

## 2019-07-29 DIAGNOSIS — E46 Unspecified protein-calorie malnutrition: Secondary | ICD-10-CM | POA: Diagnosis not present

## 2019-07-29 DIAGNOSIS — D631 Anemia in chronic kidney disease: Secondary | ICD-10-CM | POA: Diagnosis not present

## 2019-07-31 DIAGNOSIS — D509 Iron deficiency anemia, unspecified: Secondary | ICD-10-CM | POA: Diagnosis not present

## 2019-07-31 DIAGNOSIS — E46 Unspecified protein-calorie malnutrition: Secondary | ICD-10-CM | POA: Diagnosis not present

## 2019-07-31 DIAGNOSIS — Z992 Dependence on renal dialysis: Secondary | ICD-10-CM | POA: Diagnosis not present

## 2019-07-31 DIAGNOSIS — N2581 Secondary hyperparathyroidism of renal origin: Secondary | ICD-10-CM | POA: Diagnosis not present

## 2019-07-31 DIAGNOSIS — D631 Anemia in chronic kidney disease: Secondary | ICD-10-CM | POA: Diagnosis not present

## 2019-07-31 DIAGNOSIS — N186 End stage renal disease: Secondary | ICD-10-CM | POA: Diagnosis not present

## 2019-08-01 DIAGNOSIS — I10 Essential (primary) hypertension: Secondary | ICD-10-CM | POA: Diagnosis not present

## 2019-08-01 DIAGNOSIS — M159 Polyosteoarthritis, unspecified: Secondary | ICD-10-CM | POA: Diagnosis not present

## 2019-08-01 DIAGNOSIS — I509 Heart failure, unspecified: Secondary | ICD-10-CM | POA: Diagnosis not present

## 2019-08-02 DIAGNOSIS — N186 End stage renal disease: Secondary | ICD-10-CM | POA: Diagnosis not present

## 2019-08-05 ENCOUNTER — Other Ambulatory Visit (HOSPITAL_COMMUNITY): Payer: Self-pay

## 2019-08-05 DIAGNOSIS — I5022 Chronic systolic (congestive) heart failure: Secondary | ICD-10-CM | POA: Diagnosis not present

## 2019-08-05 DIAGNOSIS — I0981 Rheumatic heart failure: Secondary | ICD-10-CM | POA: Diagnosis not present

## 2019-08-05 DIAGNOSIS — N185 Chronic kidney disease, stage 5: Secondary | ICD-10-CM | POA: Diagnosis not present

## 2019-08-05 DIAGNOSIS — I2510 Atherosclerotic heart disease of native coronary artery without angina pectoris: Secondary | ICD-10-CM | POA: Diagnosis not present

## 2019-08-05 DIAGNOSIS — I132 Hypertensive heart and chronic kidney disease with heart failure and with stage 5 chronic kidney disease, or end stage renal disease: Secondary | ICD-10-CM | POA: Diagnosis not present

## 2019-08-05 DIAGNOSIS — S81802D Unspecified open wound, left lower leg, subsequent encounter: Secondary | ICD-10-CM | POA: Diagnosis not present

## 2019-08-05 MED ORDER — APIXABAN 5 MG PO TABS: 5.0000 mg | ORAL_TABLET | Freq: Two times a day (BID) | ORAL | 3 refills | Status: AC

## 2019-08-05 NOTE — Telephone Encounter (Signed)
Meds ordered this encounter  Medications   apixaban (ELIQUIS) 5 MG TABS tablet    Sig: Take 1 tablet (5 mg total) by mouth 2 (two) times daily.    Dispense:  180 tablet    Refill:  3    

## 2019-08-08 DIAGNOSIS — I5022 Chronic systolic (congestive) heart failure: Secondary | ICD-10-CM | POA: Diagnosis not present

## 2019-08-08 DIAGNOSIS — I2510 Atherosclerotic heart disease of native coronary artery without angina pectoris: Secondary | ICD-10-CM | POA: Diagnosis not present

## 2019-08-08 DIAGNOSIS — Z950 Presence of cardiac pacemaker: Secondary | ICD-10-CM | POA: Diagnosis not present

## 2019-08-08 DIAGNOSIS — Z87891 Personal history of nicotine dependence: Secondary | ICD-10-CM | POA: Diagnosis not present

## 2019-08-08 DIAGNOSIS — E669 Obesity, unspecified: Secondary | ICD-10-CM | POA: Diagnosis not present

## 2019-08-08 DIAGNOSIS — I132 Hypertensive heart and chronic kidney disease with heart failure and with stage 5 chronic kidney disease, or end stage renal disease: Secondary | ICD-10-CM | POA: Diagnosis not present

## 2019-08-08 DIAGNOSIS — Z992 Dependence on renal dialysis: Secondary | ICD-10-CM | POA: Diagnosis not present

## 2019-08-08 DIAGNOSIS — N185 Chronic kidney disease, stage 5: Secondary | ICD-10-CM | POA: Diagnosis not present

## 2019-08-08 DIAGNOSIS — D638 Anemia in other chronic diseases classified elsewhere: Secondary | ICD-10-CM | POA: Diagnosis not present

## 2019-08-08 DIAGNOSIS — E039 Hypothyroidism, unspecified: Secondary | ICD-10-CM | POA: Diagnosis not present

## 2019-08-08 DIAGNOSIS — D509 Iron deficiency anemia, unspecified: Secondary | ICD-10-CM | POA: Diagnosis not present

## 2019-08-08 DIAGNOSIS — Z7901 Long term (current) use of anticoagulants: Secondary | ICD-10-CM | POA: Diagnosis not present

## 2019-08-08 DIAGNOSIS — G4733 Obstructive sleep apnea (adult) (pediatric): Secondary | ICD-10-CM | POA: Diagnosis not present

## 2019-08-08 DIAGNOSIS — I701 Atherosclerosis of renal artery: Secondary | ICD-10-CM | POA: Diagnosis not present

## 2019-08-08 DIAGNOSIS — I447 Left bundle-branch block, unspecified: Secondary | ICD-10-CM | POA: Diagnosis not present

## 2019-08-08 DIAGNOSIS — I482 Chronic atrial fibrillation, unspecified: Secondary | ICD-10-CM | POA: Diagnosis not present

## 2019-08-08 DIAGNOSIS — I051 Rheumatic mitral insufficiency: Secondary | ICD-10-CM | POA: Diagnosis not present

## 2019-08-08 DIAGNOSIS — Z9981 Dependence on supplemental oxygen: Secondary | ICD-10-CM | POA: Diagnosis not present

## 2019-08-08 DIAGNOSIS — M150 Primary generalized (osteo)arthritis: Secondary | ICD-10-CM | POA: Diagnosis not present

## 2019-08-08 DIAGNOSIS — Z6836 Body mass index (BMI) 36.0-36.9, adult: Secondary | ICD-10-CM | POA: Diagnosis not present

## 2019-08-08 DIAGNOSIS — M1030 Gout due to renal impairment, unspecified site: Secondary | ICD-10-CM | POA: Diagnosis not present

## 2019-08-08 DIAGNOSIS — N390 Urinary tract infection, site not specified: Secondary | ICD-10-CM | POA: Diagnosis not present

## 2019-08-08 DIAGNOSIS — I0981 Rheumatic heart failure: Secondary | ICD-10-CM | POA: Diagnosis not present

## 2019-08-11 ENCOUNTER — Encounter: Payer: Medicare Other | Admitting: Internal Medicine

## 2019-08-11 DIAGNOSIS — I132 Hypertensive heart and chronic kidney disease with heart failure and with stage 5 chronic kidney disease, or end stage renal disease: Secondary | ICD-10-CM | POA: Diagnosis not present

## 2019-08-11 DIAGNOSIS — N390 Urinary tract infection, site not specified: Secondary | ICD-10-CM | POA: Diagnosis not present

## 2019-08-11 DIAGNOSIS — I2510 Atherosclerotic heart disease of native coronary artery without angina pectoris: Secondary | ICD-10-CM | POA: Diagnosis not present

## 2019-08-11 DIAGNOSIS — N185 Chronic kidney disease, stage 5: Secondary | ICD-10-CM | POA: Diagnosis not present

## 2019-08-11 DIAGNOSIS — I0981 Rheumatic heart failure: Secondary | ICD-10-CM | POA: Diagnosis not present

## 2019-08-11 DIAGNOSIS — I5022 Chronic systolic (congestive) heart failure: Secondary | ICD-10-CM | POA: Diagnosis not present

## 2019-08-18 DIAGNOSIS — N185 Chronic kidney disease, stage 5: Secondary | ICD-10-CM | POA: Diagnosis not present

## 2019-08-18 DIAGNOSIS — I0981 Rheumatic heart failure: Secondary | ICD-10-CM | POA: Diagnosis not present

## 2019-08-18 DIAGNOSIS — I132 Hypertensive heart and chronic kidney disease with heart failure and with stage 5 chronic kidney disease, or end stage renal disease: Secondary | ICD-10-CM | POA: Diagnosis not present

## 2019-08-18 DIAGNOSIS — I5022 Chronic systolic (congestive) heart failure: Secondary | ICD-10-CM | POA: Diagnosis not present

## 2019-08-18 DIAGNOSIS — I2510 Atherosclerotic heart disease of native coronary artery without angina pectoris: Secondary | ICD-10-CM | POA: Diagnosis not present

## 2019-08-18 DIAGNOSIS — N390 Urinary tract infection, site not specified: Secondary | ICD-10-CM | POA: Diagnosis not present

## 2019-08-25 DIAGNOSIS — I5022 Chronic systolic (congestive) heart failure: Secondary | ICD-10-CM | POA: Diagnosis not present

## 2019-08-25 DIAGNOSIS — I2510 Atherosclerotic heart disease of native coronary artery without angina pectoris: Secondary | ICD-10-CM | POA: Diagnosis not present

## 2019-08-25 DIAGNOSIS — I0981 Rheumatic heart failure: Secondary | ICD-10-CM | POA: Diagnosis not present

## 2019-08-25 DIAGNOSIS — N185 Chronic kidney disease, stage 5: Secondary | ICD-10-CM | POA: Diagnosis not present

## 2019-08-25 DIAGNOSIS — N390 Urinary tract infection, site not specified: Secondary | ICD-10-CM | POA: Diagnosis not present

## 2019-08-25 DIAGNOSIS — I132 Hypertensive heart and chronic kidney disease with heart failure and with stage 5 chronic kidney disease, or end stage renal disease: Secondary | ICD-10-CM | POA: Diagnosis not present

## 2019-09-01 ENCOUNTER — Other Ambulatory Visit: Payer: Self-pay | Admitting: Internal Medicine

## 2019-09-01 DIAGNOSIS — I509 Heart failure, unspecified: Secondary | ICD-10-CM | POA: Diagnosis not present

## 2019-09-01 DIAGNOSIS — I10 Essential (primary) hypertension: Secondary | ICD-10-CM | POA: Diagnosis not present

## 2019-09-01 DIAGNOSIS — M159 Polyosteoarthritis, unspecified: Secondary | ICD-10-CM | POA: Diagnosis not present

## 2019-09-02 DIAGNOSIS — E878 Other disorders of electrolyte and fluid balance, not elsewhere classified: Secondary | ICD-10-CM | POA: Diagnosis not present

## 2019-09-02 DIAGNOSIS — N186 End stage renal disease: Secondary | ICD-10-CM | POA: Diagnosis not present

## 2019-09-02 DIAGNOSIS — E46 Unspecified protein-calorie malnutrition: Secondary | ICD-10-CM | POA: Diagnosis not present

## 2019-09-02 DIAGNOSIS — D631 Anemia in chronic kidney disease: Secondary | ICD-10-CM | POA: Diagnosis not present

## 2019-09-02 DIAGNOSIS — D696 Thrombocytopenia, unspecified: Secondary | ICD-10-CM | POA: Diagnosis not present

## 2019-09-02 DIAGNOSIS — N2581 Secondary hyperparathyroidism of renal origin: Secondary | ICD-10-CM | POA: Diagnosis not present

## 2019-09-02 DIAGNOSIS — Z992 Dependence on renal dialysis: Secondary | ICD-10-CM | POA: Diagnosis not present

## 2019-09-02 DIAGNOSIS — D509 Iron deficiency anemia, unspecified: Secondary | ICD-10-CM | POA: Diagnosis not present

## 2019-09-04 DIAGNOSIS — D631 Anemia in chronic kidney disease: Secondary | ICD-10-CM | POA: Diagnosis not present

## 2019-09-04 DIAGNOSIS — Z992 Dependence on renal dialysis: Secondary | ICD-10-CM | POA: Diagnosis not present

## 2019-09-04 DIAGNOSIS — N186 End stage renal disease: Secondary | ICD-10-CM | POA: Diagnosis not present

## 2019-09-04 DIAGNOSIS — E46 Unspecified protein-calorie malnutrition: Secondary | ICD-10-CM | POA: Diagnosis not present

## 2019-09-04 DIAGNOSIS — D509 Iron deficiency anemia, unspecified: Secondary | ICD-10-CM | POA: Diagnosis not present

## 2019-09-07 DIAGNOSIS — Z7901 Long term (current) use of anticoagulants: Secondary | ICD-10-CM | POA: Diagnosis not present

## 2019-09-07 DIAGNOSIS — I051 Rheumatic mitral insufficiency: Secondary | ICD-10-CM | POA: Diagnosis not present

## 2019-09-07 DIAGNOSIS — Z950 Presence of cardiac pacemaker: Secondary | ICD-10-CM | POA: Diagnosis not present

## 2019-09-07 DIAGNOSIS — I701 Atherosclerosis of renal artery: Secondary | ICD-10-CM | POA: Diagnosis not present

## 2019-09-07 DIAGNOSIS — I5022 Chronic systolic (congestive) heart failure: Secondary | ICD-10-CM | POA: Diagnosis not present

## 2019-09-07 DIAGNOSIS — M150 Primary generalized (osteo)arthritis: Secondary | ICD-10-CM | POA: Diagnosis not present

## 2019-09-07 DIAGNOSIS — Z992 Dependence on renal dialysis: Secondary | ICD-10-CM | POA: Diagnosis not present

## 2019-09-07 DIAGNOSIS — I132 Hypertensive heart and chronic kidney disease with heart failure and with stage 5 chronic kidney disease, or end stage renal disease: Secondary | ICD-10-CM | POA: Diagnosis not present

## 2019-09-07 DIAGNOSIS — E669 Obesity, unspecified: Secondary | ICD-10-CM | POA: Diagnosis not present

## 2019-09-07 DIAGNOSIS — Z6836 Body mass index (BMI) 36.0-36.9, adult: Secondary | ICD-10-CM | POA: Diagnosis not present

## 2019-09-07 DIAGNOSIS — E46 Unspecified protein-calorie malnutrition: Secondary | ICD-10-CM | POA: Diagnosis not present

## 2019-09-07 DIAGNOSIS — D509 Iron deficiency anemia, unspecified: Secondary | ICD-10-CM | POA: Diagnosis not present

## 2019-09-07 DIAGNOSIS — M1030 Gout due to renal impairment, unspecified site: Secondary | ICD-10-CM | POA: Diagnosis not present

## 2019-09-07 DIAGNOSIS — N186 End stage renal disease: Secondary | ICD-10-CM | POA: Diagnosis not present

## 2019-09-07 DIAGNOSIS — I447 Left bundle-branch block, unspecified: Secondary | ICD-10-CM | POA: Diagnosis not present

## 2019-09-07 DIAGNOSIS — D631 Anemia in chronic kidney disease: Secondary | ICD-10-CM | POA: Diagnosis not present

## 2019-09-07 DIAGNOSIS — N185 Chronic kidney disease, stage 5: Secondary | ICD-10-CM | POA: Diagnosis not present

## 2019-09-07 DIAGNOSIS — I482 Chronic atrial fibrillation, unspecified: Secondary | ICD-10-CM | POA: Diagnosis not present

## 2019-09-07 DIAGNOSIS — D638 Anemia in other chronic diseases classified elsewhere: Secondary | ICD-10-CM | POA: Diagnosis not present

## 2019-09-07 DIAGNOSIS — I2510 Atherosclerotic heart disease of native coronary artery without angina pectoris: Secondary | ICD-10-CM | POA: Diagnosis not present

## 2019-09-07 DIAGNOSIS — N390 Urinary tract infection, site not specified: Secondary | ICD-10-CM | POA: Diagnosis not present

## 2019-09-07 DIAGNOSIS — Z9981 Dependence on supplemental oxygen: Secondary | ICD-10-CM | POA: Diagnosis not present

## 2019-09-07 DIAGNOSIS — I0981 Rheumatic heart failure: Secondary | ICD-10-CM | POA: Diagnosis not present

## 2019-09-07 DIAGNOSIS — Z87891 Personal history of nicotine dependence: Secondary | ICD-10-CM | POA: Diagnosis not present

## 2019-09-07 DIAGNOSIS — G4733 Obstructive sleep apnea (adult) (pediatric): Secondary | ICD-10-CM | POA: Diagnosis not present

## 2019-09-07 DIAGNOSIS — E039 Hypothyroidism, unspecified: Secondary | ICD-10-CM | POA: Diagnosis not present

## 2019-09-08 DIAGNOSIS — I0981 Rheumatic heart failure: Secondary | ICD-10-CM | POA: Diagnosis not present

## 2019-09-08 DIAGNOSIS — I5022 Chronic systolic (congestive) heart failure: Secondary | ICD-10-CM | POA: Diagnosis not present

## 2019-09-08 DIAGNOSIS — I132 Hypertensive heart and chronic kidney disease with heart failure and with stage 5 chronic kidney disease, or end stage renal disease: Secondary | ICD-10-CM | POA: Diagnosis not present

## 2019-09-08 DIAGNOSIS — N390 Urinary tract infection, site not specified: Secondary | ICD-10-CM | POA: Diagnosis not present

## 2019-09-08 DIAGNOSIS — N185 Chronic kidney disease, stage 5: Secondary | ICD-10-CM | POA: Diagnosis not present

## 2019-09-08 DIAGNOSIS — I2510 Atherosclerotic heart disease of native coronary artery without angina pectoris: Secondary | ICD-10-CM | POA: Diagnosis not present

## 2019-09-09 DIAGNOSIS — D631 Anemia in chronic kidney disease: Secondary | ICD-10-CM | POA: Diagnosis not present

## 2019-09-09 DIAGNOSIS — N186 End stage renal disease: Secondary | ICD-10-CM | POA: Diagnosis not present

## 2019-09-09 DIAGNOSIS — Z992 Dependence on renal dialysis: Secondary | ICD-10-CM | POA: Diagnosis not present

## 2019-09-09 DIAGNOSIS — D509 Iron deficiency anemia, unspecified: Secondary | ICD-10-CM | POA: Diagnosis not present

## 2019-09-09 DIAGNOSIS — E46 Unspecified protein-calorie malnutrition: Secondary | ICD-10-CM | POA: Diagnosis not present

## 2019-09-11 DIAGNOSIS — D631 Anemia in chronic kidney disease: Secondary | ICD-10-CM | POA: Diagnosis not present

## 2019-09-11 DIAGNOSIS — Z992 Dependence on renal dialysis: Secondary | ICD-10-CM | POA: Diagnosis not present

## 2019-09-11 DIAGNOSIS — E46 Unspecified protein-calorie malnutrition: Secondary | ICD-10-CM | POA: Diagnosis not present

## 2019-09-11 DIAGNOSIS — D509 Iron deficiency anemia, unspecified: Secondary | ICD-10-CM | POA: Diagnosis not present

## 2019-09-11 DIAGNOSIS — N186 End stage renal disease: Secondary | ICD-10-CM | POA: Diagnosis not present

## 2019-09-13 DIAGNOSIS — N185 Chronic kidney disease, stage 5: Secondary | ICD-10-CM | POA: Diagnosis not present

## 2019-09-13 DIAGNOSIS — N390 Urinary tract infection, site not specified: Secondary | ICD-10-CM | POA: Diagnosis not present

## 2019-09-13 DIAGNOSIS — I0981 Rheumatic heart failure: Secondary | ICD-10-CM | POA: Diagnosis not present

## 2019-09-13 DIAGNOSIS — I5022 Chronic systolic (congestive) heart failure: Secondary | ICD-10-CM | POA: Diagnosis not present

## 2019-09-13 DIAGNOSIS — I2510 Atherosclerotic heart disease of native coronary artery without angina pectoris: Secondary | ICD-10-CM | POA: Diagnosis not present

## 2019-09-13 DIAGNOSIS — I132 Hypertensive heart and chronic kidney disease with heart failure and with stage 5 chronic kidney disease, or end stage renal disease: Secondary | ICD-10-CM | POA: Diagnosis not present

## 2019-09-14 DIAGNOSIS — D631 Anemia in chronic kidney disease: Secondary | ICD-10-CM | POA: Diagnosis not present

## 2019-09-14 DIAGNOSIS — E46 Unspecified protein-calorie malnutrition: Secondary | ICD-10-CM | POA: Diagnosis not present

## 2019-09-14 DIAGNOSIS — D509 Iron deficiency anemia, unspecified: Secondary | ICD-10-CM | POA: Diagnosis not present

## 2019-09-14 DIAGNOSIS — N186 End stage renal disease: Secondary | ICD-10-CM | POA: Diagnosis not present

## 2019-09-14 DIAGNOSIS — Z992 Dependence on renal dialysis: Secondary | ICD-10-CM | POA: Diagnosis not present

## 2019-09-16 ENCOUNTER — Other Ambulatory Visit (HOSPITAL_COMMUNITY): Payer: Self-pay | Admitting: Cardiology

## 2019-09-16 DIAGNOSIS — I5022 Chronic systolic (congestive) heart failure: Secondary | ICD-10-CM | POA: Diagnosis not present

## 2019-09-16 DIAGNOSIS — N390 Urinary tract infection, site not specified: Secondary | ICD-10-CM | POA: Diagnosis not present

## 2019-09-16 DIAGNOSIS — D631 Anemia in chronic kidney disease: Secondary | ICD-10-CM | POA: Diagnosis not present

## 2019-09-16 DIAGNOSIS — N185 Chronic kidney disease, stage 5: Secondary | ICD-10-CM | POA: Diagnosis not present

## 2019-09-16 DIAGNOSIS — E46 Unspecified protein-calorie malnutrition: Secondary | ICD-10-CM | POA: Diagnosis not present

## 2019-09-16 DIAGNOSIS — I0981 Rheumatic heart failure: Secondary | ICD-10-CM | POA: Diagnosis not present

## 2019-09-16 DIAGNOSIS — N186 End stage renal disease: Secondary | ICD-10-CM | POA: Diagnosis not present

## 2019-09-16 DIAGNOSIS — I2510 Atherosclerotic heart disease of native coronary artery without angina pectoris: Secondary | ICD-10-CM | POA: Diagnosis not present

## 2019-09-16 DIAGNOSIS — Z992 Dependence on renal dialysis: Secondary | ICD-10-CM | POA: Diagnosis not present

## 2019-09-16 DIAGNOSIS — I132 Hypertensive heart and chronic kidney disease with heart failure and with stage 5 chronic kidney disease, or end stage renal disease: Secondary | ICD-10-CM | POA: Diagnosis not present

## 2019-09-16 DIAGNOSIS — D509 Iron deficiency anemia, unspecified: Secondary | ICD-10-CM | POA: Diagnosis not present

## 2019-09-17 ENCOUNTER — Ambulatory Visit (INDEPENDENT_AMBULATORY_CARE_PROVIDER_SITE_OTHER): Payer: Medicare Other | Admitting: *Deleted

## 2019-09-17 DIAGNOSIS — I428 Other cardiomyopathies: Secondary | ICD-10-CM

## 2019-09-17 DIAGNOSIS — I509 Heart failure, unspecified: Secondary | ICD-10-CM

## 2019-09-17 LAB — CUP PACEART REMOTE DEVICE CHECK
Battery Remaining Longevity: 99 mo
Battery Remaining Percentage: 95.5 %
Battery Voltage: 2.99 V
Brady Statistic AP VP Percent: 99 %
Brady Statistic AP VS Percent: 1 %
Brady Statistic AS VP Percent: 1 %
Brady Statistic AS VS Percent: 0 %
Brady Statistic RA Percent Paced: 7.2 %
Brady Statistic RV Percent Paced: 99 %
Date Time Interrogation Session: 20210716020015
Implantable Lead Implant Date: 20190719
Implantable Lead Implant Date: 20190719
Implantable Lead Location: 753859
Implantable Lead Location: 753860
Implantable Lead Model: 5076
Implantable Lead Model: 5076
Implantable Pulse Generator Implant Date: 20190719
Lead Channel Impedance Value: 360 Ohm
Lead Channel Impedance Value: 550 Ohm
Lead Channel Pacing Threshold Amplitude: 0.5 V
Lead Channel Pacing Threshold Amplitude: 1.5 V
Lead Channel Pacing Threshold Pulse Width: 0.4 ms
Lead Channel Pacing Threshold Pulse Width: 0.5 ms
Lead Channel Sensing Intrinsic Amplitude: 1.1 mV
Lead Channel Sensing Intrinsic Amplitude: 12 mV
Lead Channel Setting Pacing Amplitude: 2.5 V
Lead Channel Setting Pacing Amplitude: 2.5 V
Lead Channel Setting Pacing Pulse Width: 0.5 ms
Lead Channel Setting Sensing Sensitivity: 4 mV
Pulse Gen Model: 3222
Pulse Gen Serial Number: 9022392

## 2019-09-18 DIAGNOSIS — Z992 Dependence on renal dialysis: Secondary | ICD-10-CM | POA: Diagnosis not present

## 2019-09-18 DIAGNOSIS — N186 End stage renal disease: Secondary | ICD-10-CM | POA: Diagnosis not present

## 2019-09-18 DIAGNOSIS — D631 Anemia in chronic kidney disease: Secondary | ICD-10-CM | POA: Diagnosis not present

## 2019-09-18 DIAGNOSIS — E46 Unspecified protein-calorie malnutrition: Secondary | ICD-10-CM | POA: Diagnosis not present

## 2019-09-18 DIAGNOSIS — D509 Iron deficiency anemia, unspecified: Secondary | ICD-10-CM | POA: Diagnosis not present

## 2019-09-20 DIAGNOSIS — N185 Chronic kidney disease, stage 5: Secondary | ICD-10-CM | POA: Diagnosis not present

## 2019-09-20 DIAGNOSIS — I0981 Rheumatic heart failure: Secondary | ICD-10-CM | POA: Diagnosis not present

## 2019-09-20 DIAGNOSIS — N390 Urinary tract infection, site not specified: Secondary | ICD-10-CM | POA: Diagnosis not present

## 2019-09-20 DIAGNOSIS — I5022 Chronic systolic (congestive) heart failure: Secondary | ICD-10-CM | POA: Diagnosis not present

## 2019-09-20 DIAGNOSIS — I132 Hypertensive heart and chronic kidney disease with heart failure and with stage 5 chronic kidney disease, or end stage renal disease: Secondary | ICD-10-CM | POA: Diagnosis not present

## 2019-09-20 DIAGNOSIS — I2510 Atherosclerotic heart disease of native coronary artery without angina pectoris: Secondary | ICD-10-CM | POA: Diagnosis not present

## 2019-09-20 NOTE — Progress Notes (Signed)
Remote pacemaker transmission.   

## 2019-09-21 DIAGNOSIS — N186 End stage renal disease: Secondary | ICD-10-CM | POA: Diagnosis not present

## 2019-09-21 DIAGNOSIS — Z992 Dependence on renal dialysis: Secondary | ICD-10-CM | POA: Diagnosis not present

## 2019-09-21 DIAGNOSIS — D631 Anemia in chronic kidney disease: Secondary | ICD-10-CM | POA: Diagnosis not present

## 2019-09-21 DIAGNOSIS — E46 Unspecified protein-calorie malnutrition: Secondary | ICD-10-CM | POA: Diagnosis not present

## 2019-09-21 DIAGNOSIS — D509 Iron deficiency anemia, unspecified: Secondary | ICD-10-CM | POA: Diagnosis not present

## 2019-09-23 DIAGNOSIS — E46 Unspecified protein-calorie malnutrition: Secondary | ICD-10-CM | POA: Diagnosis not present

## 2019-09-23 DIAGNOSIS — Z992 Dependence on renal dialysis: Secondary | ICD-10-CM | POA: Diagnosis not present

## 2019-09-23 DIAGNOSIS — D509 Iron deficiency anemia, unspecified: Secondary | ICD-10-CM | POA: Diagnosis not present

## 2019-09-23 DIAGNOSIS — N186 End stage renal disease: Secondary | ICD-10-CM | POA: Diagnosis not present

## 2019-09-23 DIAGNOSIS — D631 Anemia in chronic kidney disease: Secondary | ICD-10-CM | POA: Diagnosis not present

## 2019-09-24 DIAGNOSIS — N185 Chronic kidney disease, stage 5: Secondary | ICD-10-CM | POA: Diagnosis not present

## 2019-09-24 DIAGNOSIS — N390 Urinary tract infection, site not specified: Secondary | ICD-10-CM | POA: Diagnosis not present

## 2019-09-24 DIAGNOSIS — I2510 Atherosclerotic heart disease of native coronary artery without angina pectoris: Secondary | ICD-10-CM | POA: Diagnosis not present

## 2019-09-24 DIAGNOSIS — I5022 Chronic systolic (congestive) heart failure: Secondary | ICD-10-CM | POA: Diagnosis not present

## 2019-09-24 DIAGNOSIS — I0981 Rheumatic heart failure: Secondary | ICD-10-CM | POA: Diagnosis not present

## 2019-09-24 DIAGNOSIS — I132 Hypertensive heart and chronic kidney disease with heart failure and with stage 5 chronic kidney disease, or end stage renal disease: Secondary | ICD-10-CM | POA: Diagnosis not present

## 2019-09-25 DIAGNOSIS — D509 Iron deficiency anemia, unspecified: Secondary | ICD-10-CM | POA: Diagnosis not present

## 2019-09-25 DIAGNOSIS — N186 End stage renal disease: Secondary | ICD-10-CM | POA: Diagnosis not present

## 2019-09-25 DIAGNOSIS — D631 Anemia in chronic kidney disease: Secondary | ICD-10-CM | POA: Diagnosis not present

## 2019-09-25 DIAGNOSIS — Z992 Dependence on renal dialysis: Secondary | ICD-10-CM | POA: Diagnosis not present

## 2019-09-25 DIAGNOSIS — E46 Unspecified protein-calorie malnutrition: Secondary | ICD-10-CM | POA: Diagnosis not present

## 2019-09-27 DIAGNOSIS — I132 Hypertensive heart and chronic kidney disease with heart failure and with stage 5 chronic kidney disease, or end stage renal disease: Secondary | ICD-10-CM | POA: Diagnosis not present

## 2019-09-27 DIAGNOSIS — I2510 Atherosclerotic heart disease of native coronary artery without angina pectoris: Secondary | ICD-10-CM | POA: Diagnosis not present

## 2019-09-27 DIAGNOSIS — N390 Urinary tract infection, site not specified: Secondary | ICD-10-CM | POA: Diagnosis not present

## 2019-09-27 DIAGNOSIS — I5022 Chronic systolic (congestive) heart failure: Secondary | ICD-10-CM | POA: Diagnosis not present

## 2019-09-27 DIAGNOSIS — I0981 Rheumatic heart failure: Secondary | ICD-10-CM | POA: Diagnosis not present

## 2019-09-27 DIAGNOSIS — N185 Chronic kidney disease, stage 5: Secondary | ICD-10-CM | POA: Diagnosis not present

## 2019-09-28 DIAGNOSIS — D509 Iron deficiency anemia, unspecified: Secondary | ICD-10-CM | POA: Diagnosis not present

## 2019-09-28 DIAGNOSIS — E46 Unspecified protein-calorie malnutrition: Secondary | ICD-10-CM | POA: Diagnosis not present

## 2019-09-28 DIAGNOSIS — Z992 Dependence on renal dialysis: Secondary | ICD-10-CM | POA: Diagnosis not present

## 2019-09-28 DIAGNOSIS — N186 End stage renal disease: Secondary | ICD-10-CM | POA: Diagnosis not present

## 2019-09-28 DIAGNOSIS — D631 Anemia in chronic kidney disease: Secondary | ICD-10-CM | POA: Diagnosis not present

## 2019-09-30 DIAGNOSIS — D509 Iron deficiency anemia, unspecified: Secondary | ICD-10-CM | POA: Diagnosis not present

## 2019-09-30 DIAGNOSIS — N186 End stage renal disease: Secondary | ICD-10-CM | POA: Diagnosis not present

## 2019-09-30 DIAGNOSIS — D631 Anemia in chronic kidney disease: Secondary | ICD-10-CM | POA: Diagnosis not present

## 2019-09-30 DIAGNOSIS — E46 Unspecified protein-calorie malnutrition: Secondary | ICD-10-CM | POA: Diagnosis not present

## 2019-09-30 DIAGNOSIS — Z992 Dependence on renal dialysis: Secondary | ICD-10-CM | POA: Diagnosis not present

## 2019-10-01 DIAGNOSIS — M159 Polyosteoarthritis, unspecified: Secondary | ICD-10-CM | POA: Diagnosis not present

## 2019-10-01 DIAGNOSIS — I0981 Rheumatic heart failure: Secondary | ICD-10-CM | POA: Diagnosis not present

## 2019-10-01 DIAGNOSIS — I10 Essential (primary) hypertension: Secondary | ICD-10-CM | POA: Diagnosis not present

## 2019-10-01 DIAGNOSIS — N390 Urinary tract infection, site not specified: Secondary | ICD-10-CM | POA: Diagnosis not present

## 2019-10-01 DIAGNOSIS — I509 Heart failure, unspecified: Secondary | ICD-10-CM | POA: Diagnosis not present

## 2019-10-01 DIAGNOSIS — I5022 Chronic systolic (congestive) heart failure: Secondary | ICD-10-CM | POA: Diagnosis not present

## 2019-10-01 DIAGNOSIS — N185 Chronic kidney disease, stage 5: Secondary | ICD-10-CM | POA: Diagnosis not present

## 2019-10-01 DIAGNOSIS — I132 Hypertensive heart and chronic kidney disease with heart failure and with stage 5 chronic kidney disease, or end stage renal disease: Secondary | ICD-10-CM | POA: Diagnosis not present

## 2019-10-01 DIAGNOSIS — I2510 Atherosclerotic heart disease of native coronary artery without angina pectoris: Secondary | ICD-10-CM | POA: Diagnosis not present

## 2019-10-02 DIAGNOSIS — N186 End stage renal disease: Secondary | ICD-10-CM | POA: Diagnosis not present

## 2019-10-02 DIAGNOSIS — Z992 Dependence on renal dialysis: Secondary | ICD-10-CM | POA: Diagnosis not present

## 2019-10-02 DIAGNOSIS — D509 Iron deficiency anemia, unspecified: Secondary | ICD-10-CM | POA: Diagnosis not present

## 2019-10-02 DIAGNOSIS — D631 Anemia in chronic kidney disease: Secondary | ICD-10-CM | POA: Diagnosis not present

## 2019-10-02 DIAGNOSIS — E46 Unspecified protein-calorie malnutrition: Secondary | ICD-10-CM | POA: Diagnosis not present

## 2019-10-05 DIAGNOSIS — N186 End stage renal disease: Secondary | ICD-10-CM | POA: Diagnosis not present

## 2019-10-05 DIAGNOSIS — N390 Urinary tract infection, site not specified: Secondary | ICD-10-CM | POA: Diagnosis not present

## 2019-10-05 DIAGNOSIS — I5022 Chronic systolic (congestive) heart failure: Secondary | ICD-10-CM | POA: Diagnosis not present

## 2019-10-05 DIAGNOSIS — I132 Hypertensive heart and chronic kidney disease with heart failure and with stage 5 chronic kidney disease, or end stage renal disease: Secondary | ICD-10-CM | POA: Diagnosis not present

## 2019-10-05 DIAGNOSIS — I0981 Rheumatic heart failure: Secondary | ICD-10-CM | POA: Diagnosis not present

## 2019-10-05 DIAGNOSIS — E46 Unspecified protein-calorie malnutrition: Secondary | ICD-10-CM | POA: Diagnosis not present

## 2019-10-05 DIAGNOSIS — I2510 Atherosclerotic heart disease of native coronary artery without angina pectoris: Secondary | ICD-10-CM | POA: Diagnosis not present

## 2019-10-05 DIAGNOSIS — D696 Thrombocytopenia, unspecified: Secondary | ICD-10-CM | POA: Diagnosis not present

## 2019-10-05 DIAGNOSIS — N185 Chronic kidney disease, stage 5: Secondary | ICD-10-CM | POA: Diagnosis not present

## 2019-10-05 DIAGNOSIS — Z23 Encounter for immunization: Secondary | ICD-10-CM | POA: Diagnosis not present

## 2019-10-05 DIAGNOSIS — Z992 Dependence on renal dialysis: Secondary | ICD-10-CM | POA: Diagnosis not present

## 2019-10-05 DIAGNOSIS — D631 Anemia in chronic kidney disease: Secondary | ICD-10-CM | POA: Diagnosis not present

## 2019-10-05 DIAGNOSIS — E878 Other disorders of electrolyte and fluid balance, not elsewhere classified: Secondary | ICD-10-CM | POA: Diagnosis not present

## 2019-10-05 DIAGNOSIS — N2581 Secondary hyperparathyroidism of renal origin: Secondary | ICD-10-CM | POA: Diagnosis not present

## 2019-10-05 DIAGNOSIS — D509 Iron deficiency anemia, unspecified: Secondary | ICD-10-CM | POA: Diagnosis not present

## 2019-10-07 DIAGNOSIS — Z9981 Dependence on supplemental oxygen: Secondary | ICD-10-CM | POA: Diagnosis not present

## 2019-10-07 DIAGNOSIS — I2510 Atherosclerotic heart disease of native coronary artery without angina pectoris: Secondary | ICD-10-CM | POA: Diagnosis not present

## 2019-10-07 DIAGNOSIS — E46 Unspecified protein-calorie malnutrition: Secondary | ICD-10-CM | POA: Diagnosis not present

## 2019-10-07 DIAGNOSIS — I5022 Chronic systolic (congestive) heart failure: Secondary | ICD-10-CM | POA: Diagnosis not present

## 2019-10-07 DIAGNOSIS — I701 Atherosclerosis of renal artery: Secondary | ICD-10-CM | POA: Diagnosis not present

## 2019-10-07 DIAGNOSIS — I132 Hypertensive heart and chronic kidney disease with heart failure and with stage 5 chronic kidney disease, or end stage renal disease: Secondary | ICD-10-CM | POA: Diagnosis not present

## 2019-10-07 DIAGNOSIS — I051 Rheumatic mitral insufficiency: Secondary | ICD-10-CM | POA: Diagnosis not present

## 2019-10-07 DIAGNOSIS — N186 End stage renal disease: Secondary | ICD-10-CM | POA: Diagnosis not present

## 2019-10-07 DIAGNOSIS — D509 Iron deficiency anemia, unspecified: Secondary | ICD-10-CM | POA: Diagnosis not present

## 2019-10-07 DIAGNOSIS — Z992 Dependence on renal dialysis: Secondary | ICD-10-CM | POA: Diagnosis not present

## 2019-10-07 DIAGNOSIS — Z7901 Long term (current) use of anticoagulants: Secondary | ICD-10-CM | POA: Diagnosis not present

## 2019-10-07 DIAGNOSIS — I482 Chronic atrial fibrillation, unspecified: Secondary | ICD-10-CM | POA: Diagnosis not present

## 2019-10-07 DIAGNOSIS — I447 Left bundle-branch block, unspecified: Secondary | ICD-10-CM | POA: Diagnosis not present

## 2019-10-07 DIAGNOSIS — Z87891 Personal history of nicotine dependence: Secondary | ICD-10-CM | POA: Diagnosis not present

## 2019-10-07 DIAGNOSIS — Z87440 Personal history of urinary (tract) infections: Secondary | ICD-10-CM | POA: Diagnosis not present

## 2019-10-07 DIAGNOSIS — M1030 Gout due to renal impairment, unspecified site: Secondary | ICD-10-CM | POA: Diagnosis not present

## 2019-10-07 DIAGNOSIS — D631 Anemia in chronic kidney disease: Secondary | ICD-10-CM | POA: Diagnosis not present

## 2019-10-07 DIAGNOSIS — E039 Hypothyroidism, unspecified: Secondary | ICD-10-CM | POA: Diagnosis not present

## 2019-10-07 DIAGNOSIS — M150 Primary generalized (osteo)arthritis: Secondary | ICD-10-CM | POA: Diagnosis not present

## 2019-10-07 DIAGNOSIS — N185 Chronic kidney disease, stage 5: Secondary | ICD-10-CM | POA: Diagnosis not present

## 2019-10-07 DIAGNOSIS — N2581 Secondary hyperparathyroidism of renal origin: Secondary | ICD-10-CM | POA: Diagnosis not present

## 2019-10-07 DIAGNOSIS — Z950 Presence of cardiac pacemaker: Secondary | ICD-10-CM | POA: Diagnosis not present

## 2019-10-07 DIAGNOSIS — Z6836 Body mass index (BMI) 36.0-36.9, adult: Secondary | ICD-10-CM | POA: Diagnosis not present

## 2019-10-07 DIAGNOSIS — E669 Obesity, unspecified: Secondary | ICD-10-CM | POA: Diagnosis not present

## 2019-10-07 DIAGNOSIS — G4733 Obstructive sleep apnea (adult) (pediatric): Secondary | ICD-10-CM | POA: Diagnosis not present

## 2019-10-07 DIAGNOSIS — I0981 Rheumatic heart failure: Secondary | ICD-10-CM | POA: Diagnosis not present

## 2019-10-08 DIAGNOSIS — M1030 Gout due to renal impairment, unspecified site: Secondary | ICD-10-CM | POA: Diagnosis not present

## 2019-10-08 DIAGNOSIS — I5022 Chronic systolic (congestive) heart failure: Secondary | ICD-10-CM | POA: Diagnosis not present

## 2019-10-08 DIAGNOSIS — I132 Hypertensive heart and chronic kidney disease with heart failure and with stage 5 chronic kidney disease, or end stage renal disease: Secondary | ICD-10-CM | POA: Diagnosis not present

## 2019-10-08 DIAGNOSIS — D631 Anemia in chronic kidney disease: Secondary | ICD-10-CM | POA: Diagnosis not present

## 2019-10-08 DIAGNOSIS — N185 Chronic kidney disease, stage 5: Secondary | ICD-10-CM | POA: Diagnosis not present

## 2019-10-08 DIAGNOSIS — I2510 Atherosclerotic heart disease of native coronary artery without angina pectoris: Secondary | ICD-10-CM | POA: Diagnosis not present

## 2019-10-09 DIAGNOSIS — D631 Anemia in chronic kidney disease: Secondary | ICD-10-CM | POA: Diagnosis not present

## 2019-10-09 DIAGNOSIS — Z992 Dependence on renal dialysis: Secondary | ICD-10-CM | POA: Diagnosis not present

## 2019-10-09 DIAGNOSIS — E46 Unspecified protein-calorie malnutrition: Secondary | ICD-10-CM | POA: Diagnosis not present

## 2019-10-09 DIAGNOSIS — N186 End stage renal disease: Secondary | ICD-10-CM | POA: Diagnosis not present

## 2019-10-09 DIAGNOSIS — D509 Iron deficiency anemia, unspecified: Secondary | ICD-10-CM | POA: Diagnosis not present

## 2019-10-09 DIAGNOSIS — N2581 Secondary hyperparathyroidism of renal origin: Secondary | ICD-10-CM | POA: Diagnosis not present

## 2019-10-11 DIAGNOSIS — I132 Hypertensive heart and chronic kidney disease with heart failure and with stage 5 chronic kidney disease, or end stage renal disease: Secondary | ICD-10-CM | POA: Diagnosis not present

## 2019-10-11 DIAGNOSIS — N185 Chronic kidney disease, stage 5: Secondary | ICD-10-CM | POA: Diagnosis not present

## 2019-10-11 DIAGNOSIS — D631 Anemia in chronic kidney disease: Secondary | ICD-10-CM | POA: Diagnosis not present

## 2019-10-11 DIAGNOSIS — M1030 Gout due to renal impairment, unspecified site: Secondary | ICD-10-CM | POA: Diagnosis not present

## 2019-10-11 DIAGNOSIS — I5022 Chronic systolic (congestive) heart failure: Secondary | ICD-10-CM | POA: Diagnosis not present

## 2019-10-11 DIAGNOSIS — I2510 Atherosclerotic heart disease of native coronary artery without angina pectoris: Secondary | ICD-10-CM | POA: Diagnosis not present

## 2019-10-12 DIAGNOSIS — D631 Anemia in chronic kidney disease: Secondary | ICD-10-CM | POA: Diagnosis not present

## 2019-10-12 DIAGNOSIS — N186 End stage renal disease: Secondary | ICD-10-CM | POA: Diagnosis not present

## 2019-10-12 DIAGNOSIS — D509 Iron deficiency anemia, unspecified: Secondary | ICD-10-CM | POA: Diagnosis not present

## 2019-10-12 DIAGNOSIS — Z992 Dependence on renal dialysis: Secondary | ICD-10-CM | POA: Diagnosis not present

## 2019-10-12 DIAGNOSIS — E46 Unspecified protein-calorie malnutrition: Secondary | ICD-10-CM | POA: Diagnosis not present

## 2019-10-12 DIAGNOSIS — N2581 Secondary hyperparathyroidism of renal origin: Secondary | ICD-10-CM | POA: Diagnosis not present

## 2019-10-14 DIAGNOSIS — N186 End stage renal disease: Secondary | ICD-10-CM | POA: Diagnosis not present

## 2019-10-14 DIAGNOSIS — Z992 Dependence on renal dialysis: Secondary | ICD-10-CM | POA: Diagnosis not present

## 2019-10-14 DIAGNOSIS — N2581 Secondary hyperparathyroidism of renal origin: Secondary | ICD-10-CM | POA: Diagnosis not present

## 2019-10-14 DIAGNOSIS — E46 Unspecified protein-calorie malnutrition: Secondary | ICD-10-CM | POA: Diagnosis not present

## 2019-10-14 DIAGNOSIS — D509 Iron deficiency anemia, unspecified: Secondary | ICD-10-CM | POA: Diagnosis not present

## 2019-10-14 DIAGNOSIS — D631 Anemia in chronic kidney disease: Secondary | ICD-10-CM | POA: Diagnosis not present

## 2019-10-15 DIAGNOSIS — I132 Hypertensive heart and chronic kidney disease with heart failure and with stage 5 chronic kidney disease, or end stage renal disease: Secondary | ICD-10-CM | POA: Diagnosis not present

## 2019-10-15 DIAGNOSIS — I2510 Atherosclerotic heart disease of native coronary artery without angina pectoris: Secondary | ICD-10-CM | POA: Diagnosis not present

## 2019-10-15 DIAGNOSIS — I5022 Chronic systolic (congestive) heart failure: Secondary | ICD-10-CM | POA: Diagnosis not present

## 2019-10-15 DIAGNOSIS — N185 Chronic kidney disease, stage 5: Secondary | ICD-10-CM | POA: Diagnosis not present

## 2019-10-16 DIAGNOSIS — D509 Iron deficiency anemia, unspecified: Secondary | ICD-10-CM | POA: Diagnosis not present

## 2019-10-16 DIAGNOSIS — Z992 Dependence on renal dialysis: Secondary | ICD-10-CM | POA: Diagnosis not present

## 2019-10-16 DIAGNOSIS — N2581 Secondary hyperparathyroidism of renal origin: Secondary | ICD-10-CM | POA: Diagnosis not present

## 2019-10-16 DIAGNOSIS — N186 End stage renal disease: Secondary | ICD-10-CM | POA: Diagnosis not present

## 2019-10-16 DIAGNOSIS — D631 Anemia in chronic kidney disease: Secondary | ICD-10-CM | POA: Diagnosis not present

## 2019-10-16 DIAGNOSIS — E46 Unspecified protein-calorie malnutrition: Secondary | ICD-10-CM | POA: Diagnosis not present

## 2019-10-19 DIAGNOSIS — D631 Anemia in chronic kidney disease: Secondary | ICD-10-CM | POA: Diagnosis not present

## 2019-10-19 DIAGNOSIS — N2581 Secondary hyperparathyroidism of renal origin: Secondary | ICD-10-CM | POA: Diagnosis not present

## 2019-10-19 DIAGNOSIS — Z992 Dependence on renal dialysis: Secondary | ICD-10-CM | POA: Diagnosis not present

## 2019-10-19 DIAGNOSIS — D509 Iron deficiency anemia, unspecified: Secondary | ICD-10-CM | POA: Diagnosis not present

## 2019-10-19 DIAGNOSIS — E46 Unspecified protein-calorie malnutrition: Secondary | ICD-10-CM | POA: Diagnosis not present

## 2019-10-19 DIAGNOSIS — N186 End stage renal disease: Secondary | ICD-10-CM | POA: Diagnosis not present

## 2019-10-21 DIAGNOSIS — N2581 Secondary hyperparathyroidism of renal origin: Secondary | ICD-10-CM | POA: Diagnosis not present

## 2019-10-21 DIAGNOSIS — E46 Unspecified protein-calorie malnutrition: Secondary | ICD-10-CM | POA: Diagnosis not present

## 2019-10-21 DIAGNOSIS — D509 Iron deficiency anemia, unspecified: Secondary | ICD-10-CM | POA: Diagnosis not present

## 2019-10-21 DIAGNOSIS — Z992 Dependence on renal dialysis: Secondary | ICD-10-CM | POA: Diagnosis not present

## 2019-10-21 DIAGNOSIS — N186 End stage renal disease: Secondary | ICD-10-CM | POA: Diagnosis not present

## 2019-10-21 DIAGNOSIS — D631 Anemia in chronic kidney disease: Secondary | ICD-10-CM | POA: Diagnosis not present

## 2019-10-22 ENCOUNTER — Encounter: Payer: Medicare Other | Admitting: Internal Medicine

## 2019-10-23 DIAGNOSIS — Z992 Dependence on renal dialysis: Secondary | ICD-10-CM | POA: Diagnosis not present

## 2019-10-23 DIAGNOSIS — N186 End stage renal disease: Secondary | ICD-10-CM | POA: Diagnosis not present

## 2019-10-23 DIAGNOSIS — N2581 Secondary hyperparathyroidism of renal origin: Secondary | ICD-10-CM | POA: Diagnosis not present

## 2019-10-23 DIAGNOSIS — D631 Anemia in chronic kidney disease: Secondary | ICD-10-CM | POA: Diagnosis not present

## 2019-10-23 DIAGNOSIS — E46 Unspecified protein-calorie malnutrition: Secondary | ICD-10-CM | POA: Diagnosis not present

## 2019-10-23 DIAGNOSIS — D509 Iron deficiency anemia, unspecified: Secondary | ICD-10-CM | POA: Diagnosis not present

## 2019-10-25 DIAGNOSIS — D631 Anemia in chronic kidney disease: Secondary | ICD-10-CM | POA: Diagnosis not present

## 2019-10-25 DIAGNOSIS — M1030 Gout due to renal impairment, unspecified site: Secondary | ICD-10-CM | POA: Diagnosis not present

## 2019-10-25 DIAGNOSIS — I2510 Atherosclerotic heart disease of native coronary artery without angina pectoris: Secondary | ICD-10-CM | POA: Diagnosis not present

## 2019-10-25 DIAGNOSIS — I132 Hypertensive heart and chronic kidney disease with heart failure and with stage 5 chronic kidney disease, or end stage renal disease: Secondary | ICD-10-CM | POA: Diagnosis not present

## 2019-10-25 DIAGNOSIS — N185 Chronic kidney disease, stage 5: Secondary | ICD-10-CM | POA: Diagnosis not present

## 2019-10-25 DIAGNOSIS — I5022 Chronic systolic (congestive) heart failure: Secondary | ICD-10-CM | POA: Diagnosis not present

## 2019-10-26 DIAGNOSIS — N2581 Secondary hyperparathyroidism of renal origin: Secondary | ICD-10-CM | POA: Diagnosis not present

## 2019-10-26 DIAGNOSIS — D509 Iron deficiency anemia, unspecified: Secondary | ICD-10-CM | POA: Diagnosis not present

## 2019-10-26 DIAGNOSIS — D631 Anemia in chronic kidney disease: Secondary | ICD-10-CM | POA: Diagnosis not present

## 2019-10-26 DIAGNOSIS — Z992 Dependence on renal dialysis: Secondary | ICD-10-CM | POA: Diagnosis not present

## 2019-10-26 DIAGNOSIS — E46 Unspecified protein-calorie malnutrition: Secondary | ICD-10-CM | POA: Diagnosis not present

## 2019-10-26 DIAGNOSIS — N186 End stage renal disease: Secondary | ICD-10-CM | POA: Diagnosis not present

## 2019-10-28 DIAGNOSIS — N186 End stage renal disease: Secondary | ICD-10-CM | POA: Diagnosis not present

## 2019-10-28 DIAGNOSIS — E46 Unspecified protein-calorie malnutrition: Secondary | ICD-10-CM | POA: Diagnosis not present

## 2019-10-28 DIAGNOSIS — D631 Anemia in chronic kidney disease: Secondary | ICD-10-CM | POA: Diagnosis not present

## 2019-10-28 DIAGNOSIS — D509 Iron deficiency anemia, unspecified: Secondary | ICD-10-CM | POA: Diagnosis not present

## 2019-10-28 DIAGNOSIS — N2581 Secondary hyperparathyroidism of renal origin: Secondary | ICD-10-CM | POA: Diagnosis not present

## 2019-10-28 DIAGNOSIS — Z992 Dependence on renal dialysis: Secondary | ICD-10-CM | POA: Diagnosis not present

## 2019-10-30 DIAGNOSIS — N2581 Secondary hyperparathyroidism of renal origin: Secondary | ICD-10-CM | POA: Diagnosis not present

## 2019-10-30 DIAGNOSIS — Z992 Dependence on renal dialysis: Secondary | ICD-10-CM | POA: Diagnosis not present

## 2019-10-30 DIAGNOSIS — E46 Unspecified protein-calorie malnutrition: Secondary | ICD-10-CM | POA: Diagnosis not present

## 2019-10-30 DIAGNOSIS — D509 Iron deficiency anemia, unspecified: Secondary | ICD-10-CM | POA: Diagnosis not present

## 2019-10-30 DIAGNOSIS — N186 End stage renal disease: Secondary | ICD-10-CM | POA: Diagnosis not present

## 2019-10-30 DIAGNOSIS — D631 Anemia in chronic kidney disease: Secondary | ICD-10-CM | POA: Diagnosis not present

## 2019-11-01 DIAGNOSIS — M159 Polyosteoarthritis, unspecified: Secondary | ICD-10-CM | POA: Diagnosis not present

## 2019-11-01 DIAGNOSIS — I509 Heart failure, unspecified: Secondary | ICD-10-CM | POA: Diagnosis not present

## 2019-11-01 DIAGNOSIS — I10 Essential (primary) hypertension: Secondary | ICD-10-CM | POA: Diagnosis not present

## 2019-11-02 DIAGNOSIS — D631 Anemia in chronic kidney disease: Secondary | ICD-10-CM | POA: Diagnosis not present

## 2019-11-02 DIAGNOSIS — N186 End stage renal disease: Secondary | ICD-10-CM | POA: Diagnosis not present

## 2019-11-02 DIAGNOSIS — E46 Unspecified protein-calorie malnutrition: Secondary | ICD-10-CM | POA: Diagnosis not present

## 2019-11-02 DIAGNOSIS — Z992 Dependence on renal dialysis: Secondary | ICD-10-CM | POA: Diagnosis not present

## 2019-11-02 DIAGNOSIS — N2581 Secondary hyperparathyroidism of renal origin: Secondary | ICD-10-CM | POA: Diagnosis not present

## 2019-11-02 DIAGNOSIS — D509 Iron deficiency anemia, unspecified: Secondary | ICD-10-CM | POA: Diagnosis not present

## 2019-11-04 DIAGNOSIS — D631 Anemia in chronic kidney disease: Secondary | ICD-10-CM | POA: Diagnosis not present

## 2019-11-04 DIAGNOSIS — N186 End stage renal disease: Secondary | ICD-10-CM | POA: Diagnosis not present

## 2019-11-04 DIAGNOSIS — Z23 Encounter for immunization: Secondary | ICD-10-CM | POA: Diagnosis not present

## 2019-11-04 DIAGNOSIS — D696 Thrombocytopenia, unspecified: Secondary | ICD-10-CM | POA: Diagnosis not present

## 2019-11-04 DIAGNOSIS — N2581 Secondary hyperparathyroidism of renal origin: Secondary | ICD-10-CM | POA: Diagnosis not present

## 2019-11-04 DIAGNOSIS — E878 Other disorders of electrolyte and fluid balance, not elsewhere classified: Secondary | ICD-10-CM | POA: Diagnosis not present

## 2019-11-04 DIAGNOSIS — D509 Iron deficiency anemia, unspecified: Secondary | ICD-10-CM | POA: Diagnosis not present

## 2019-11-04 DIAGNOSIS — E46 Unspecified protein-calorie malnutrition: Secondary | ICD-10-CM | POA: Diagnosis not present

## 2019-11-04 DIAGNOSIS — Z992 Dependence on renal dialysis: Secondary | ICD-10-CM | POA: Diagnosis not present

## 2019-11-06 DIAGNOSIS — Z87891 Personal history of nicotine dependence: Secondary | ICD-10-CM | POA: Diagnosis not present

## 2019-11-06 DIAGNOSIS — Z9981 Dependence on supplemental oxygen: Secondary | ICD-10-CM | POA: Diagnosis not present

## 2019-11-06 DIAGNOSIS — I482 Chronic atrial fibrillation, unspecified: Secondary | ICD-10-CM | POA: Diagnosis not present

## 2019-11-06 DIAGNOSIS — D509 Iron deficiency anemia, unspecified: Secondary | ICD-10-CM | POA: Diagnosis not present

## 2019-11-06 DIAGNOSIS — I701 Atherosclerosis of renal artery: Secondary | ICD-10-CM | POA: Diagnosis not present

## 2019-11-06 DIAGNOSIS — N185 Chronic kidney disease, stage 5: Secondary | ICD-10-CM | POA: Diagnosis not present

## 2019-11-06 DIAGNOSIS — M1030 Gout due to renal impairment, unspecified site: Secondary | ICD-10-CM | POA: Diagnosis not present

## 2019-11-06 DIAGNOSIS — E46 Unspecified protein-calorie malnutrition: Secondary | ICD-10-CM | POA: Diagnosis not present

## 2019-11-06 DIAGNOSIS — Z87440 Personal history of urinary (tract) infections: Secondary | ICD-10-CM | POA: Diagnosis not present

## 2019-11-06 DIAGNOSIS — N2581 Secondary hyperparathyroidism of renal origin: Secondary | ICD-10-CM | POA: Diagnosis not present

## 2019-11-06 DIAGNOSIS — D631 Anemia in chronic kidney disease: Secondary | ICD-10-CM | POA: Diagnosis not present

## 2019-11-06 DIAGNOSIS — N186 End stage renal disease: Secondary | ICD-10-CM | POA: Diagnosis not present

## 2019-11-06 DIAGNOSIS — I447 Left bundle-branch block, unspecified: Secondary | ICD-10-CM | POA: Diagnosis not present

## 2019-11-06 DIAGNOSIS — I132 Hypertensive heart and chronic kidney disease with heart failure and with stage 5 chronic kidney disease, or end stage renal disease: Secondary | ICD-10-CM | POA: Diagnosis not present

## 2019-11-06 DIAGNOSIS — I2510 Atherosclerotic heart disease of native coronary artery without angina pectoris: Secondary | ICD-10-CM | POA: Diagnosis not present

## 2019-11-06 DIAGNOSIS — M150 Primary generalized (osteo)arthritis: Secondary | ICD-10-CM | POA: Diagnosis not present

## 2019-11-06 DIAGNOSIS — I0981 Rheumatic heart failure: Secondary | ICD-10-CM | POA: Diagnosis not present

## 2019-11-06 DIAGNOSIS — E669 Obesity, unspecified: Secondary | ICD-10-CM | POA: Diagnosis not present

## 2019-11-06 DIAGNOSIS — I5022 Chronic systolic (congestive) heart failure: Secondary | ICD-10-CM | POA: Diagnosis not present

## 2019-11-06 DIAGNOSIS — G4733 Obstructive sleep apnea (adult) (pediatric): Secondary | ICD-10-CM | POA: Diagnosis not present

## 2019-11-06 DIAGNOSIS — Z992 Dependence on renal dialysis: Secondary | ICD-10-CM | POA: Diagnosis not present

## 2019-11-06 DIAGNOSIS — E878 Other disorders of electrolyte and fluid balance, not elsewhere classified: Secondary | ICD-10-CM | POA: Diagnosis not present

## 2019-11-06 DIAGNOSIS — Z950 Presence of cardiac pacemaker: Secondary | ICD-10-CM | POA: Diagnosis not present

## 2019-11-06 DIAGNOSIS — Z7901 Long term (current) use of anticoagulants: Secondary | ICD-10-CM | POA: Diagnosis not present

## 2019-11-06 DIAGNOSIS — E039 Hypothyroidism, unspecified: Secondary | ICD-10-CM | POA: Diagnosis not present

## 2019-11-06 DIAGNOSIS — Z6836 Body mass index (BMI) 36.0-36.9, adult: Secondary | ICD-10-CM | POA: Diagnosis not present

## 2019-11-06 DIAGNOSIS — I051 Rheumatic mitral insufficiency: Secondary | ICD-10-CM | POA: Diagnosis not present

## 2019-11-09 DIAGNOSIS — Z992 Dependence on renal dialysis: Secondary | ICD-10-CM | POA: Diagnosis not present

## 2019-11-09 DIAGNOSIS — E878 Other disorders of electrolyte and fluid balance, not elsewhere classified: Secondary | ICD-10-CM | POA: Diagnosis not present

## 2019-11-09 DIAGNOSIS — E46 Unspecified protein-calorie malnutrition: Secondary | ICD-10-CM | POA: Diagnosis not present

## 2019-11-09 DIAGNOSIS — N2581 Secondary hyperparathyroidism of renal origin: Secondary | ICD-10-CM | POA: Diagnosis not present

## 2019-11-09 DIAGNOSIS — N186 End stage renal disease: Secondary | ICD-10-CM | POA: Diagnosis not present

## 2019-11-11 DIAGNOSIS — N2581 Secondary hyperparathyroidism of renal origin: Secondary | ICD-10-CM | POA: Diagnosis not present

## 2019-11-11 DIAGNOSIS — E878 Other disorders of electrolyte and fluid balance, not elsewhere classified: Secondary | ICD-10-CM | POA: Diagnosis not present

## 2019-11-11 DIAGNOSIS — Z992 Dependence on renal dialysis: Secondary | ICD-10-CM | POA: Diagnosis not present

## 2019-11-11 DIAGNOSIS — E46 Unspecified protein-calorie malnutrition: Secondary | ICD-10-CM | POA: Diagnosis not present

## 2019-11-11 DIAGNOSIS — N186 End stage renal disease: Secondary | ICD-10-CM | POA: Diagnosis not present

## 2019-11-12 DIAGNOSIS — I5022 Chronic systolic (congestive) heart failure: Secondary | ICD-10-CM | POA: Diagnosis not present

## 2019-11-12 DIAGNOSIS — I2510 Atherosclerotic heart disease of native coronary artery without angina pectoris: Secondary | ICD-10-CM | POA: Diagnosis not present

## 2019-11-12 DIAGNOSIS — D631 Anemia in chronic kidney disease: Secondary | ICD-10-CM | POA: Diagnosis not present

## 2019-11-12 DIAGNOSIS — N185 Chronic kidney disease, stage 5: Secondary | ICD-10-CM | POA: Diagnosis not present

## 2019-11-12 DIAGNOSIS — I132 Hypertensive heart and chronic kidney disease with heart failure and with stage 5 chronic kidney disease, or end stage renal disease: Secondary | ICD-10-CM | POA: Diagnosis not present

## 2019-11-12 DIAGNOSIS — M1030 Gout due to renal impairment, unspecified site: Secondary | ICD-10-CM | POA: Diagnosis not present

## 2019-11-13 DIAGNOSIS — E878 Other disorders of electrolyte and fluid balance, not elsewhere classified: Secondary | ICD-10-CM | POA: Diagnosis not present

## 2019-11-13 DIAGNOSIS — N2581 Secondary hyperparathyroidism of renal origin: Secondary | ICD-10-CM | POA: Diagnosis not present

## 2019-11-13 DIAGNOSIS — E46 Unspecified protein-calorie malnutrition: Secondary | ICD-10-CM | POA: Diagnosis not present

## 2019-11-13 DIAGNOSIS — N186 End stage renal disease: Secondary | ICD-10-CM | POA: Diagnosis not present

## 2019-11-13 DIAGNOSIS — Z992 Dependence on renal dialysis: Secondary | ICD-10-CM | POA: Diagnosis not present

## 2019-11-16 DIAGNOSIS — Z992 Dependence on renal dialysis: Secondary | ICD-10-CM | POA: Diagnosis not present

## 2019-11-16 DIAGNOSIS — E46 Unspecified protein-calorie malnutrition: Secondary | ICD-10-CM | POA: Diagnosis not present

## 2019-11-16 DIAGNOSIS — N186 End stage renal disease: Secondary | ICD-10-CM | POA: Diagnosis not present

## 2019-11-16 DIAGNOSIS — E878 Other disorders of electrolyte and fluid balance, not elsewhere classified: Secondary | ICD-10-CM | POA: Diagnosis not present

## 2019-11-16 DIAGNOSIS — N2581 Secondary hyperparathyroidism of renal origin: Secondary | ICD-10-CM | POA: Diagnosis not present

## 2019-11-18 DIAGNOSIS — E46 Unspecified protein-calorie malnutrition: Secondary | ICD-10-CM | POA: Diagnosis not present

## 2019-11-18 DIAGNOSIS — N2581 Secondary hyperparathyroidism of renal origin: Secondary | ICD-10-CM | POA: Diagnosis not present

## 2019-11-18 DIAGNOSIS — E878 Other disorders of electrolyte and fluid balance, not elsewhere classified: Secondary | ICD-10-CM | POA: Diagnosis not present

## 2019-11-18 DIAGNOSIS — N186 End stage renal disease: Secondary | ICD-10-CM | POA: Diagnosis not present

## 2019-11-18 DIAGNOSIS — Z992 Dependence on renal dialysis: Secondary | ICD-10-CM | POA: Diagnosis not present

## 2019-11-19 DIAGNOSIS — N185 Chronic kidney disease, stage 5: Secondary | ICD-10-CM | POA: Diagnosis not present

## 2019-11-19 DIAGNOSIS — I2510 Atherosclerotic heart disease of native coronary artery without angina pectoris: Secondary | ICD-10-CM | POA: Diagnosis not present

## 2019-11-19 DIAGNOSIS — D631 Anemia in chronic kidney disease: Secondary | ICD-10-CM | POA: Diagnosis not present

## 2019-11-19 DIAGNOSIS — I5022 Chronic systolic (congestive) heart failure: Secondary | ICD-10-CM | POA: Diagnosis not present

## 2019-11-19 DIAGNOSIS — M1030 Gout due to renal impairment, unspecified site: Secondary | ICD-10-CM | POA: Diagnosis not present

## 2019-11-19 DIAGNOSIS — I132 Hypertensive heart and chronic kidney disease with heart failure and with stage 5 chronic kidney disease, or end stage renal disease: Secondary | ICD-10-CM | POA: Diagnosis not present

## 2019-11-20 DIAGNOSIS — E878 Other disorders of electrolyte and fluid balance, not elsewhere classified: Secondary | ICD-10-CM | POA: Diagnosis not present

## 2019-11-20 DIAGNOSIS — N186 End stage renal disease: Secondary | ICD-10-CM | POA: Diagnosis not present

## 2019-11-20 DIAGNOSIS — N2581 Secondary hyperparathyroidism of renal origin: Secondary | ICD-10-CM | POA: Diagnosis not present

## 2019-11-20 DIAGNOSIS — Z992 Dependence on renal dialysis: Secondary | ICD-10-CM | POA: Diagnosis not present

## 2019-11-20 DIAGNOSIS — E46 Unspecified protein-calorie malnutrition: Secondary | ICD-10-CM | POA: Diagnosis not present

## 2019-11-24 DIAGNOSIS — Z79899 Other long term (current) drug therapy: Secondary | ICD-10-CM | POA: Diagnosis not present

## 2019-11-24 DIAGNOSIS — Z452 Encounter for adjustment and management of vascular access device: Secondary | ICD-10-CM | POA: Diagnosis not present

## 2019-11-24 DIAGNOSIS — I4891 Unspecified atrial fibrillation: Secondary | ICD-10-CM | POA: Diagnosis not present

## 2019-11-24 DIAGNOSIS — B950 Streptococcus, group A, as the cause of diseases classified elsewhere: Secondary | ICD-10-CM | POA: Diagnosis not present

## 2019-11-24 DIAGNOSIS — F419 Anxiety disorder, unspecified: Secondary | ICD-10-CM | POA: Diagnosis not present

## 2019-11-24 DIAGNOSIS — J80 Acute respiratory distress syndrome: Secondary | ICD-10-CM | POA: Diagnosis not present

## 2019-11-24 DIAGNOSIS — Z992 Dependence on renal dialysis: Secondary | ICD-10-CM | POA: Diagnosis not present

## 2019-11-24 DIAGNOSIS — Z881 Allergy status to other antibiotic agents status: Secondary | ICD-10-CM | POA: Diagnosis not present

## 2019-11-24 DIAGNOSIS — R404 Transient alteration of awareness: Secondary | ICD-10-CM | POA: Diagnosis not present

## 2019-11-24 DIAGNOSIS — I468 Cardiac arrest due to other underlying condition: Secondary | ICD-10-CM | POA: Diagnosis not present

## 2019-11-24 DIAGNOSIS — E039 Hypothyroidism, unspecified: Secondary | ICD-10-CM | POA: Diagnosis not present

## 2019-11-24 DIAGNOSIS — J15211 Pneumonia due to Methicillin susceptible Staphylococcus aureus: Secondary | ICD-10-CM | POA: Diagnosis not present

## 2019-11-24 DIAGNOSIS — N179 Acute kidney failure, unspecified: Secondary | ICD-10-CM | POA: Diagnosis not present

## 2019-11-24 DIAGNOSIS — Z736 Limitation of activities due to disability: Secondary | ICD-10-CM | POA: Diagnosis not present

## 2019-11-24 DIAGNOSIS — Z7901 Long term (current) use of anticoagulants: Secondary | ICD-10-CM | POA: Diagnosis not present

## 2019-11-24 DIAGNOSIS — J99 Respiratory disorders in diseases classified elsewhere: Secondary | ICD-10-CM | POA: Diagnosis not present

## 2019-11-24 DIAGNOSIS — Z6841 Body Mass Index (BMI) 40.0 and over, adult: Secondary | ICD-10-CM | POA: Diagnosis not present

## 2019-11-24 DIAGNOSIS — M6281 Muscle weakness (generalized): Secondary | ICD-10-CM | POA: Diagnosis not present

## 2019-11-24 DIAGNOSIS — I5023 Acute on chronic systolic (congestive) heart failure: Secondary | ICD-10-CM | POA: Diagnosis not present

## 2019-11-24 DIAGNOSIS — I469 Cardiac arrest, cause unspecified: Secondary | ICD-10-CM | POA: Diagnosis not present

## 2019-11-24 DIAGNOSIS — Z7409 Other reduced mobility: Secondary | ICD-10-CM | POA: Diagnosis not present

## 2019-11-24 DIAGNOSIS — Z20822 Contact with and (suspected) exposure to covid-19: Secondary | ICD-10-CM | POA: Diagnosis not present

## 2019-11-24 DIAGNOSIS — E872 Acidosis: Secondary | ICD-10-CM | POA: Diagnosis not present

## 2019-11-24 DIAGNOSIS — I10 Essential (primary) hypertension: Secondary | ICD-10-CM | POA: Diagnosis not present

## 2019-11-24 DIAGNOSIS — G9341 Metabolic encephalopathy: Secondary | ICD-10-CM | POA: Diagnosis not present

## 2019-11-24 DIAGNOSIS — I132 Hypertensive heart and chronic kidney disease with heart failure and with stage 5 chronic kidney disease, or end stage renal disease: Secondary | ICD-10-CM | POA: Diagnosis not present

## 2019-11-24 DIAGNOSIS — E669 Obesity, unspecified: Secondary | ICD-10-CM | POA: Diagnosis not present

## 2019-11-24 DIAGNOSIS — R627 Adult failure to thrive: Secondary | ICD-10-CM | POA: Diagnosis not present

## 2019-11-24 DIAGNOSIS — Z66 Do not resuscitate: Secondary | ICD-10-CM | POA: Diagnosis not present

## 2019-11-24 DIAGNOSIS — E876 Hypokalemia: Secondary | ICD-10-CM | POA: Diagnosis not present

## 2019-11-24 DIAGNOSIS — J188 Other pneumonia, unspecified organism: Secondary | ICD-10-CM | POA: Diagnosis not present

## 2019-11-24 DIAGNOSIS — Z7982 Long term (current) use of aspirin: Secondary | ICD-10-CM | POA: Diagnosis not present

## 2019-11-24 DIAGNOSIS — Z9115 Patient's noncompliance with renal dialysis: Secondary | ICD-10-CM | POA: Diagnosis not present

## 2019-11-24 DIAGNOSIS — R0902 Hypoxemia: Secondary | ICD-10-CM | POA: Diagnosis not present

## 2019-11-24 DIAGNOSIS — R0689 Other abnormalities of breathing: Secondary | ICD-10-CM | POA: Diagnosis not present

## 2019-11-24 DIAGNOSIS — G9340 Encephalopathy, unspecified: Secondary | ICD-10-CM | POA: Diagnosis not present

## 2019-11-24 DIAGNOSIS — E8770 Fluid overload, unspecified: Secondary | ICD-10-CM | POA: Diagnosis not present

## 2019-11-24 DIAGNOSIS — G9349 Other encephalopathy: Secondary | ICD-10-CM | POA: Diagnosis not present

## 2019-11-24 DIAGNOSIS — Z7401 Bed confinement status: Secondary | ICD-10-CM | POA: Diagnosis not present

## 2019-11-24 DIAGNOSIS — R2681 Unsteadiness on feet: Secondary | ICD-10-CM | POA: Diagnosis not present

## 2019-11-24 DIAGNOSIS — Z741 Need for assistance with personal care: Secondary | ICD-10-CM | POA: Diagnosis not present

## 2019-11-24 DIAGNOSIS — E875 Hyperkalemia: Secondary | ICD-10-CM | POA: Diagnosis not present

## 2019-11-24 DIAGNOSIS — R488 Other symbolic dysfunctions: Secondary | ICD-10-CM | POA: Diagnosis not present

## 2019-11-24 DIAGNOSIS — D631 Anemia in chronic kidney disease: Secondary | ICD-10-CM | POA: Diagnosis not present

## 2019-11-24 DIAGNOSIS — I959 Hypotension, unspecified: Secondary | ICD-10-CM | POA: Diagnosis not present

## 2019-11-24 DIAGNOSIS — J9690 Respiratory failure, unspecified, unspecified whether with hypoxia or hypercapnia: Secondary | ICD-10-CM | POA: Diagnosis not present

## 2019-11-24 DIAGNOSIS — J9602 Acute respiratory failure with hypercapnia: Secondary | ICD-10-CM | POA: Diagnosis not present

## 2019-11-24 DIAGNOSIS — J9601 Acute respiratory failure with hypoxia: Secondary | ICD-10-CM | POA: Diagnosis not present

## 2019-11-24 DIAGNOSIS — I509 Heart failure, unspecified: Secondary | ICD-10-CM | POA: Diagnosis not present

## 2019-11-24 DIAGNOSIS — G4733 Obstructive sleep apnea (adult) (pediatric): Secondary | ICD-10-CM | POA: Diagnosis not present

## 2019-11-24 DIAGNOSIS — N390 Urinary tract infection, site not specified: Secondary | ICD-10-CM | POA: Diagnosis not present

## 2019-11-24 DIAGNOSIS — R570 Cardiogenic shock: Secondary | ICD-10-CM | POA: Diagnosis not present

## 2019-11-24 DIAGNOSIS — J168 Pneumonia due to other specified infectious organisms: Secondary | ICD-10-CM | POA: Diagnosis not present

## 2019-11-24 DIAGNOSIS — N186 End stage renal disease: Secondary | ICD-10-CM | POA: Diagnosis not present

## 2019-11-24 DIAGNOSIS — R0602 Shortness of breath: Secondary | ICD-10-CM | POA: Diagnosis not present

## 2019-11-30 ENCOUNTER — Telehealth: Payer: Self-pay | Admitting: Internal Medicine

## 2019-11-30 NOTE — Telephone Encounter (Signed)
° ° ° °  Gina [name] pt's niece calling, she said pt is in [facility] hospital and would like to let DR. Lovena Le know. She said she doesn't have her monitor in the hospital and couldn't send transmission. She said If Dr. Lovena Le have questions to call her

## 2019-11-30 NOTE — Telephone Encounter (Signed)
Attempted to call patients family member Marliss Czar [phone], number I was given by family.   No answer, LMOVM.   Need to advise patient that I will forward this message to Dr. Lovena Le per her request. The remote monitor box is fine not to be with patient in the hospital, it will connect when she goes back home.

## 2019-12-09 DIAGNOSIS — N185 Chronic kidney disease, stage 5: Secondary | ICD-10-CM | POA: Diagnosis not present

## 2019-12-09 DIAGNOSIS — Z7401 Bed confinement status: Secondary | ICD-10-CM | POA: Diagnosis not present

## 2019-12-09 DIAGNOSIS — I4891 Unspecified atrial fibrillation: Secondary | ICD-10-CM | POA: Diagnosis not present

## 2019-12-09 DIAGNOSIS — L89302 Pressure ulcer of unspecified buttock, stage 2: Secondary | ICD-10-CM | POA: Diagnosis not present

## 2019-12-09 DIAGNOSIS — L89313 Pressure ulcer of right buttock, stage 3: Secondary | ICD-10-CM | POA: Diagnosis not present

## 2019-12-09 DIAGNOSIS — R488 Other symbolic dysfunctions: Secondary | ICD-10-CM | POA: Diagnosis not present

## 2019-12-09 DIAGNOSIS — R059 Cough, unspecified: Secondary | ICD-10-CM | POA: Diagnosis not present

## 2019-12-09 DIAGNOSIS — I959 Hypotension, unspecified: Secondary | ICD-10-CM | POA: Diagnosis not present

## 2019-12-09 DIAGNOSIS — B950 Streptococcus, group A, as the cause of diseases classified elsewhere: Secondary | ICD-10-CM | POA: Diagnosis not present

## 2019-12-09 DIAGNOSIS — I340 Nonrheumatic mitral (valve) insufficiency: Secondary | ICD-10-CM | POA: Diagnosis not present

## 2019-12-09 DIAGNOSIS — M549 Dorsalgia, unspecified: Secondary | ICD-10-CM | POA: Diagnosis not present

## 2019-12-09 DIAGNOSIS — R9431 Abnormal electrocardiogram [ECG] [EKG]: Secondary | ICD-10-CM | POA: Diagnosis not present

## 2019-12-09 DIAGNOSIS — Z992 Dependence on renal dialysis: Secondary | ICD-10-CM | POA: Diagnosis not present

## 2019-12-09 DIAGNOSIS — I289 Disease of pulmonary vessels, unspecified: Secondary | ICD-10-CM | POA: Diagnosis not present

## 2019-12-09 DIAGNOSIS — I509 Heart failure, unspecified: Secondary | ICD-10-CM | POA: Diagnosis not present

## 2019-12-09 DIAGNOSIS — J9621 Acute and chronic respiratory failure with hypoxia: Secondary | ICD-10-CM | POA: Diagnosis not present

## 2019-12-09 DIAGNOSIS — L89322 Pressure ulcer of left buttock, stage 2: Secondary | ICD-10-CM | POA: Diagnosis not present

## 2019-12-09 DIAGNOSIS — G8929 Other chronic pain: Secondary | ICD-10-CM | POA: Diagnosis not present

## 2019-12-09 DIAGNOSIS — Z736 Limitation of activities due to disability: Secondary | ICD-10-CM | POA: Diagnosis not present

## 2019-12-09 DIAGNOSIS — E782 Mixed hyperlipidemia: Secondary | ICD-10-CM | POA: Diagnosis not present

## 2019-12-09 DIAGNOSIS — Z8249 Family history of ischemic heart disease and other diseases of the circulatory system: Secondary | ICD-10-CM | POA: Diagnosis not present

## 2019-12-09 DIAGNOSIS — I4819 Other persistent atrial fibrillation: Secondary | ICD-10-CM | POA: Diagnosis not present

## 2019-12-09 DIAGNOSIS — Z9981 Dependence on supplemental oxygen: Secondary | ICD-10-CM | POA: Diagnosis not present

## 2019-12-09 DIAGNOSIS — Z7409 Other reduced mobility: Secondary | ICD-10-CM | POA: Diagnosis not present

## 2019-12-09 DIAGNOSIS — I517 Cardiomegaly: Secondary | ICD-10-CM | POA: Diagnosis not present

## 2019-12-09 DIAGNOSIS — F419 Anxiety disorder, unspecified: Secondary | ICD-10-CM | POA: Diagnosis not present

## 2019-12-09 DIAGNOSIS — G9340 Encephalopathy, unspecified: Secondary | ICD-10-CM | POA: Diagnosis not present

## 2019-12-09 DIAGNOSIS — I10 Essential (primary) hypertension: Secondary | ICD-10-CM | POA: Diagnosis not present

## 2019-12-09 DIAGNOSIS — M6281 Muscle weakness (generalized): Secondary | ICD-10-CM | POA: Diagnosis not present

## 2019-12-09 DIAGNOSIS — J209 Acute bronchitis, unspecified: Secondary | ICD-10-CM | POA: Diagnosis not present

## 2019-12-09 DIAGNOSIS — I5023 Acute on chronic systolic (congestive) heart failure: Secondary | ICD-10-CM | POA: Diagnosis not present

## 2019-12-09 DIAGNOSIS — Z87891 Personal history of nicotine dependence: Secondary | ICD-10-CM | POA: Diagnosis not present

## 2019-12-09 DIAGNOSIS — I493 Ventricular premature depolarization: Secondary | ICD-10-CM | POA: Diagnosis not present

## 2019-12-09 DIAGNOSIS — I4821 Permanent atrial fibrillation: Secondary | ICD-10-CM | POA: Diagnosis not present

## 2019-12-09 DIAGNOSIS — I469 Cardiac arrest, cause unspecified: Secondary | ICD-10-CM | POA: Diagnosis not present

## 2019-12-09 DIAGNOSIS — N179 Acute kidney failure, unspecified: Secondary | ICD-10-CM | POA: Diagnosis not present

## 2019-12-09 DIAGNOSIS — B9721 SARS-associated coronavirus as the cause of diseases classified elsewhere: Secondary | ICD-10-CM | POA: Diagnosis not present

## 2019-12-09 DIAGNOSIS — E669 Obesity, unspecified: Secondary | ICD-10-CM | POA: Diagnosis not present

## 2019-12-09 DIAGNOSIS — J9600 Acute respiratory failure, unspecified whether with hypoxia or hypercapnia: Secondary | ICD-10-CM | POA: Diagnosis not present

## 2019-12-09 DIAGNOSIS — N390 Urinary tract infection, site not specified: Secondary | ICD-10-CM | POA: Diagnosis not present

## 2019-12-09 DIAGNOSIS — Z79899 Other long term (current) drug therapy: Secondary | ICD-10-CM | POA: Diagnosis not present

## 2019-12-09 DIAGNOSIS — R0789 Other chest pain: Secondary | ICD-10-CM | POA: Diagnosis not present

## 2019-12-09 DIAGNOSIS — L89312 Pressure ulcer of right buttock, stage 2: Secondary | ICD-10-CM | POA: Diagnosis not present

## 2019-12-09 DIAGNOSIS — Z741 Need for assistance with personal care: Secondary | ICD-10-CM | POA: Diagnosis not present

## 2019-12-09 DIAGNOSIS — R279 Unspecified lack of coordination: Secondary | ICD-10-CM | POA: Diagnosis not present

## 2019-12-09 DIAGNOSIS — G9349 Other encephalopathy: Secondary | ICD-10-CM | POA: Diagnosis not present

## 2019-12-09 DIAGNOSIS — S51812A Laceration without foreign body of left forearm, initial encounter: Secondary | ICD-10-CM | POA: Diagnosis not present

## 2019-12-09 DIAGNOSIS — R7989 Other specified abnormal findings of blood chemistry: Secondary | ICD-10-CM | POA: Diagnosis not present

## 2019-12-09 DIAGNOSIS — I468 Cardiac arrest due to other underlying condition: Secondary | ICD-10-CM | POA: Diagnosis not present

## 2019-12-09 DIAGNOSIS — I428 Other cardiomyopathies: Secondary | ICD-10-CM | POA: Diagnosis not present

## 2019-12-09 DIAGNOSIS — I2729 Other secondary pulmonary hypertension: Secondary | ICD-10-CM | POA: Diagnosis not present

## 2019-12-09 DIAGNOSIS — L89320 Pressure ulcer of left buttock, unstageable: Secondary | ICD-10-CM | POA: Diagnosis not present

## 2019-12-09 DIAGNOSIS — R1310 Dysphagia, unspecified: Secondary | ICD-10-CM | POA: Diagnosis not present

## 2019-12-09 DIAGNOSIS — G4733 Obstructive sleep apnea (adult) (pediatric): Secondary | ICD-10-CM | POA: Diagnosis not present

## 2019-12-09 DIAGNOSIS — R0602 Shortness of breath: Secondary | ICD-10-CM | POA: Diagnosis not present

## 2019-12-09 DIAGNOSIS — Z950 Presence of cardiac pacemaker: Secondary | ICD-10-CM | POA: Diagnosis not present

## 2019-12-09 DIAGNOSIS — I501 Left ventricular failure: Secondary | ICD-10-CM | POA: Diagnosis not present

## 2019-12-09 DIAGNOSIS — I5022 Chronic systolic (congestive) heart failure: Secondary | ICD-10-CM | POA: Diagnosis not present

## 2019-12-09 DIAGNOSIS — Z7689 Persons encountering health services in other specified circumstances: Secondary | ICD-10-CM | POA: Diagnosis not present

## 2019-12-09 DIAGNOSIS — B9789 Other viral agents as the cause of diseases classified elsewhere: Secondary | ICD-10-CM | POA: Diagnosis not present

## 2019-12-09 DIAGNOSIS — I132 Hypertensive heart and chronic kidney disease with heart failure and with stage 5 chronic kidney disease, or end stage renal disease: Secondary | ICD-10-CM | POA: Diagnosis not present

## 2019-12-09 DIAGNOSIS — E039 Hypothyroidism, unspecified: Secondary | ICD-10-CM | POA: Diagnosis not present

## 2019-12-09 DIAGNOSIS — J9601 Acute respiratory failure with hypoxia: Secondary | ICD-10-CM | POA: Diagnosis not present

## 2019-12-09 DIAGNOSIS — J188 Other pneumonia, unspecified organism: Secondary | ICD-10-CM | POA: Diagnosis not present

## 2019-12-09 DIAGNOSIS — Z7901 Long term (current) use of anticoagulants: Secondary | ICD-10-CM | POA: Diagnosis not present

## 2019-12-09 DIAGNOSIS — J15211 Pneumonia due to Methicillin susceptible Staphylococcus aureus: Secondary | ICD-10-CM | POA: Diagnosis not present

## 2019-12-09 DIAGNOSIS — M109 Gout, unspecified: Secondary | ICD-10-CM | POA: Diagnosis not present

## 2019-12-09 DIAGNOSIS — Z8674 Personal history of sudden cardiac arrest: Secondary | ICD-10-CM | POA: Diagnosis not present

## 2019-12-09 DIAGNOSIS — N186 End stage renal disease: Secondary | ICD-10-CM | POA: Diagnosis not present

## 2019-12-09 DIAGNOSIS — E875 Hyperkalemia: Secondary | ICD-10-CM | POA: Diagnosis not present

## 2019-12-09 DIAGNOSIS — U071 COVID-19: Secondary | ICD-10-CM | POA: Diagnosis not present

## 2019-12-09 DIAGNOSIS — R2681 Unsteadiness on feet: Secondary | ICD-10-CM | POA: Diagnosis not present

## 2019-12-09 DIAGNOSIS — F321 Major depressive disorder, single episode, moderate: Secondary | ICD-10-CM | POA: Diagnosis not present

## 2019-12-09 DIAGNOSIS — J99 Respiratory disorders in diseases classified elsewhere: Secondary | ICD-10-CM | POA: Diagnosis not present

## 2019-12-09 DIAGNOSIS — R079 Chest pain, unspecified: Secondary | ICD-10-CM | POA: Diagnosis not present

## 2019-12-09 DIAGNOSIS — J9622 Acute and chronic respiratory failure with hypercapnia: Secondary | ICD-10-CM | POA: Diagnosis not present

## 2019-12-10 DIAGNOSIS — N186 End stage renal disease: Secondary | ICD-10-CM | POA: Diagnosis not present

## 2019-12-10 DIAGNOSIS — I469 Cardiac arrest, cause unspecified: Secondary | ICD-10-CM | POA: Diagnosis not present

## 2019-12-10 DIAGNOSIS — Z992 Dependence on renal dialysis: Secondary | ICD-10-CM | POA: Diagnosis not present

## 2019-12-10 DIAGNOSIS — J9600 Acute respiratory failure, unspecified whether with hypoxia or hypercapnia: Secondary | ICD-10-CM | POA: Diagnosis not present

## 2019-12-13 DIAGNOSIS — L89312 Pressure ulcer of right buttock, stage 2: Secondary | ICD-10-CM | POA: Diagnosis not present

## 2019-12-13 DIAGNOSIS — L89320 Pressure ulcer of left buttock, unstageable: Secondary | ICD-10-CM | POA: Diagnosis not present

## 2019-12-14 DIAGNOSIS — I469 Cardiac arrest, cause unspecified: Secondary | ICD-10-CM | POA: Diagnosis not present

## 2019-12-14 DIAGNOSIS — E875 Hyperkalemia: Secondary | ICD-10-CM | POA: Diagnosis not present

## 2019-12-14 DIAGNOSIS — J9621 Acute and chronic respiratory failure with hypoxia: Secondary | ICD-10-CM | POA: Diagnosis not present

## 2019-12-14 DIAGNOSIS — J9622 Acute and chronic respiratory failure with hypercapnia: Secondary | ICD-10-CM | POA: Diagnosis not present

## 2019-12-17 ENCOUNTER — Ambulatory Visit (INDEPENDENT_AMBULATORY_CARE_PROVIDER_SITE_OTHER): Payer: Medicare Other

## 2019-12-17 DIAGNOSIS — I4819 Other persistent atrial fibrillation: Secondary | ICD-10-CM | POA: Diagnosis not present

## 2019-12-18 LAB — CUP PACEART REMOTE DEVICE CHECK
Battery Remaining Longevity: 100 mo
Battery Remaining Percentage: 95.5 %
Battery Voltage: 2.99 V
Brady Statistic AP VP Percent: 99 %
Brady Statistic AP VS Percent: 1 %
Brady Statistic AS VP Percent: 1 %
Brady Statistic AS VS Percent: 1 %
Brady Statistic RA Percent Paced: 5.4 %
Brady Statistic RV Percent Paced: 99 %
Date Time Interrogation Session: 20211015020014
Implantable Lead Implant Date: 20190719
Implantable Lead Implant Date: 20190719
Implantable Lead Location: 753859
Implantable Lead Location: 753860
Implantable Lead Model: 5076
Implantable Lead Model: 5076
Implantable Pulse Generator Implant Date: 20190719
Lead Channel Impedance Value: 360 Ohm
Lead Channel Impedance Value: 560 Ohm
Lead Channel Pacing Threshold Amplitude: 0.5 V
Lead Channel Pacing Threshold Amplitude: 1.5 V
Lead Channel Pacing Threshold Pulse Width: 0.4 ms
Lead Channel Pacing Threshold Pulse Width: 0.5 ms
Lead Channel Sensing Intrinsic Amplitude: 0.3 mV
Lead Channel Sensing Intrinsic Amplitude: 7.6 mV
Lead Channel Setting Pacing Amplitude: 2.5 V
Lead Channel Setting Pacing Amplitude: 2.5 V
Lead Channel Setting Pacing Pulse Width: 0.5 ms
Lead Channel Setting Sensing Sensitivity: 4 mV
Pulse Gen Model: 3222
Pulse Gen Serial Number: 9022392

## 2019-12-20 DIAGNOSIS — L89322 Pressure ulcer of left buttock, stage 2: Secondary | ICD-10-CM | POA: Diagnosis not present

## 2019-12-20 DIAGNOSIS — L89313 Pressure ulcer of right buttock, stage 3: Secondary | ICD-10-CM | POA: Diagnosis not present

## 2019-12-21 DIAGNOSIS — M549 Dorsalgia, unspecified: Secondary | ICD-10-CM | POA: Diagnosis not present

## 2019-12-21 DIAGNOSIS — G8929 Other chronic pain: Secondary | ICD-10-CM | POA: Diagnosis not present

## 2019-12-22 NOTE — Progress Notes (Signed)
Remote pacemaker transmission.   

## 2019-12-27 DIAGNOSIS — R079 Chest pain, unspecified: Secondary | ICD-10-CM | POA: Diagnosis not present

## 2019-12-27 DIAGNOSIS — L89322 Pressure ulcer of left buttock, stage 2: Secondary | ICD-10-CM | POA: Diagnosis not present

## 2019-12-27 DIAGNOSIS — Z992 Dependence on renal dialysis: Secondary | ICD-10-CM | POA: Diagnosis not present

## 2019-12-27 DIAGNOSIS — R0602 Shortness of breath: Secondary | ICD-10-CM | POA: Diagnosis not present

## 2019-12-27 DIAGNOSIS — N186 End stage renal disease: Secondary | ICD-10-CM | POA: Diagnosis not present

## 2019-12-27 DIAGNOSIS — L89313 Pressure ulcer of right buttock, stage 3: Secondary | ICD-10-CM | POA: Diagnosis not present

## 2019-12-28 DIAGNOSIS — M549 Dorsalgia, unspecified: Secondary | ICD-10-CM | POA: Diagnosis not present

## 2019-12-28 DIAGNOSIS — G8929 Other chronic pain: Secondary | ICD-10-CM | POA: Diagnosis not present

## 2019-12-28 DIAGNOSIS — R0602 Shortness of breath: Secondary | ICD-10-CM | POA: Diagnosis not present

## 2019-12-29 DIAGNOSIS — F419 Anxiety disorder, unspecified: Secondary | ICD-10-CM | POA: Diagnosis not present

## 2019-12-29 DIAGNOSIS — F321 Major depressive disorder, single episode, moderate: Secondary | ICD-10-CM | POA: Diagnosis not present

## 2019-12-30 ENCOUNTER — Encounter (HOSPITAL_COMMUNITY): Payer: Self-pay

## 2019-12-31 DIAGNOSIS — E039 Hypothyroidism, unspecified: Secondary | ICD-10-CM | POA: Diagnosis not present

## 2019-12-31 DIAGNOSIS — R1310 Dysphagia, unspecified: Secondary | ICD-10-CM | POA: Diagnosis not present

## 2020-01-03 ENCOUNTER — Encounter: Payer: Self-pay | Admitting: Internal Medicine

## 2020-01-03 ENCOUNTER — Other Ambulatory Visit: Payer: Self-pay

## 2020-01-03 ENCOUNTER — Ambulatory Visit (HOSPITAL_COMMUNITY)
Admission: RE | Admit: 2020-01-03 | Discharge: 2020-01-03 | Disposition: A | Discharge status: Home / Self Care | Payer: Medicare Other | Source: Ambulatory Visit | Attending: Cardiology | Admitting: Cardiology

## 2020-01-03 ENCOUNTER — Encounter (HOSPITAL_COMMUNITY): Payer: Self-pay | Admitting: Cardiology

## 2020-01-03 ENCOUNTER — Ambulatory Visit (INDEPENDENT_AMBULATORY_CARE_PROVIDER_SITE_OTHER): Payer: Medicare Other | Admitting: Internal Medicine

## 2020-01-03 VITALS — BP 102/60 | HR 72

## 2020-01-03 VITALS — BP 104/68 | HR 72 | Ht 64.0 in

## 2020-01-03 DIAGNOSIS — G4733 Obstructive sleep apnea (adult) (pediatric): Secondary | ICD-10-CM | POA: Diagnosis not present

## 2020-01-03 DIAGNOSIS — Z950 Presence of cardiac pacemaker: Secondary | ICD-10-CM | POA: Diagnosis not present

## 2020-01-03 DIAGNOSIS — N186 End stage renal disease: Secondary | ICD-10-CM | POA: Insufficient documentation

## 2020-01-03 DIAGNOSIS — I428 Other cardiomyopathies: Secondary | ICD-10-CM | POA: Insufficient documentation

## 2020-01-03 DIAGNOSIS — I4821 Permanent atrial fibrillation: Secondary | ICD-10-CM | POA: Insufficient documentation

## 2020-01-03 DIAGNOSIS — Z87891 Personal history of nicotine dependence: Secondary | ICD-10-CM | POA: Diagnosis not present

## 2020-01-03 DIAGNOSIS — I5022 Chronic systolic (congestive) heart failure: Secondary | ICD-10-CM | POA: Diagnosis not present

## 2020-01-03 DIAGNOSIS — Z8249 Family history of ischemic heart disease and other diseases of the circulatory system: Secondary | ICD-10-CM | POA: Insufficient documentation

## 2020-01-03 DIAGNOSIS — I493 Ventricular premature depolarization: Secondary | ICD-10-CM | POA: Insufficient documentation

## 2020-01-03 DIAGNOSIS — I4819 Other persistent atrial fibrillation: Secondary | ICD-10-CM

## 2020-01-03 DIAGNOSIS — Z992 Dependence on renal dialysis: Secondary | ICD-10-CM | POA: Diagnosis not present

## 2020-01-03 DIAGNOSIS — Z7901 Long term (current) use of anticoagulants: Secondary | ICD-10-CM | POA: Diagnosis not present

## 2020-01-03 DIAGNOSIS — E039 Hypothyroidism, unspecified: Secondary | ICD-10-CM | POA: Diagnosis not present

## 2020-01-03 DIAGNOSIS — I132 Hypertensive heart and chronic kidney disease with heart failure and with stage 5 chronic kidney disease, or end stage renal disease: Secondary | ICD-10-CM | POA: Insufficient documentation

## 2020-01-03 DIAGNOSIS — I509 Heart failure, unspecified: Secondary | ICD-10-CM

## 2020-01-03 DIAGNOSIS — Z8674 Personal history of sudden cardiac arrest: Secondary | ICD-10-CM | POA: Insufficient documentation

## 2020-01-03 DIAGNOSIS — Z79899 Other long term (current) drug therapy: Secondary | ICD-10-CM | POA: Insufficient documentation

## 2020-01-03 LAB — HEPATIC FUNCTION PANEL
ALT: 13 U/L (ref 0–44)
AST: 15 U/L (ref 15–41)
Albumin: 3.4 g/dL — ABNORMAL LOW (ref 3.5–5.0)
Alkaline Phosphatase: 94 U/L (ref 38–126)
Bilirubin, Direct: 0.6 mg/dL — ABNORMAL HIGH (ref 0.0–0.2)
Indirect Bilirubin: 1 mg/dL — ABNORMAL HIGH (ref 0.3–0.9)
Total Bilirubin: 1.6 mg/dL — ABNORMAL HIGH (ref 0.3–1.2)
Total Protein: 5.9 g/dL — ABNORMAL LOW (ref 6.5–8.1)

## 2020-01-03 LAB — TSH: TSH: 9.668 u[IU]/mL — ABNORMAL HIGH (ref 0.350–4.500)

## 2020-01-03 NOTE — Progress Notes (Signed)
HPI Mrs. Piacente returns today for followup. She is a pleasant very ill 79 yo woman with ESRD on HD who had a cardiac arrest in the setting of missed HD, hyperkalemia and respiratory failure. She was resuscitated. She resides in a nursing facility but hopes to get back home. She is undergoing HD thru a fistula on the left arm. She denies chest pain or sob. She is not ambulating yet. SHe denies recurrent syncope. Previously she was found to have an occluded left subclavian vein. Allergies  Allergen Reactions  . Bactrim [Sulfamethoxazole-Trimethoprim] Other (See Comments)    Flu like symptoms  . Cephalexin Rash    Rash to arms/legs     Current Outpatient Medications  Medication Sig Dispense Refill  . acetaminophen (TYLENOL) 500 MG tablet Take 1,000 mg by mouth at bedtime.    [name] amiodarone (PACERONE) 200 MG tablet TAKE 1 TABLET(200 MG) BY MOUTH DAILY 30 tablet 11  . apixaban (ELIQUIS) 5 MG TABS tablet Take 1 tablet (5 mg total) by mouth 2 (two) times daily. 180 tablet 3  . escitalopram (LEXAPRO) 5 MG tablet Take 5 mg by mouth daily.    [name] levothyroxine (SYNTHROID) 75 MCG tablet Take 75 mcg by mouth daily before breakfast.    . lidocaine-prilocaine (EMLA) cream APPLY TO ACCESS SITE 30 MINUTES PRIOR TO DIALYSIS AND WRAP WITH SARAN WRAP    . loperamide (IMODIUM) 1 MG/5ML solution Take 1 mg by mouth as needed for diarrhea or loose stools.    . sevelamer carbonate (RENVELA) 800 MG tablet Take 800 mg by mouth 3 (three) times daily with meals.    . sodium chloride (OCEAN) 0.65 % SOLN nasal spray Place 1 spray into both nostrils 4 (four) times daily as needed for congestion.     No current facility-administered medications for this visit.     Past Medical History:  Diagnosis Date  . Acute renal failure [facility])     hospitalized... December 08, 2009... improved with hydration in the hospital  . Anemia    further workup needed... October, 2011.  [name] Anxiety   . Aortic insufficiency     mild...echo... June 2 010  . Arthritis    Hips/Knees, severe, requiring a walker  . Asymmetric septal hypertrophy (HCC)    echo....04/2009....patient encouraged the family to be screened elsewhere  . Atrial fibrillation ([place])     ??? atrial fibrillation during hospitalization October, 2011 ???..  . Bradycardia   . CAD (coronary artery disease)    Mild coronary disease, catheterization, 2009  . Cardiomyopathy, nonischemic ([place])    EF improved October, 2011  . CHF (congestive heart failure) ([place])   . Ejection fraction    EF 25%...nondiagnostic MRI December 2009...  /  echo June, 2010  /   echo... December 08, 2009.... ejection fraction improved... EF 60% /  EF 30%... echo.... [place]... December 28, 2009 with CHFPLanned ICD / CRT... patient has seen Dr.Klein..EF improved October, 2011   . ESRD (end-stage renal disease) due to SLE [facility])    Dialysis T/Th/Sa  . Groin hematoma    Right-hematoma/abscess..repaired.. December, 2009  . History of COVID-19   . Hypertension   . Hypothyroidism   . LBBB (left bundle branch block)   . LVH (left ventricular hypertrophy)    moderately severe... echo.. June, 2010  /  EF improved October, 2011.... cancel plans for ICD  . Mitral regurgitation    mild/ moderate...echo June, 2010  /   echo.. October, 2011.... mitral  regurgitation  improved  . OSA (obstructive sleep apnea)    uses a cpap  . Presence of permanent cardiac pacemaker   . Renal artery stenosis (HCC)    80% upper left   . Systolic heart failure    chronic    ROS:   All systems reviewed and negative except as noted in the HPI.   Past Surgical History:  Procedure Laterality Date  . ABDOMINAL HYSTERECTOMY    . AV FISTULA PLACEMENT Right 02/04/2019   Procedure: ARTERIOVENOUS (AV) FISTULA CREATION;  Surgeon: Waynetta Sandy, MD;  Location: [place];  Service: Vascular;  Laterality: Right;  . AV NODE ABLATION N/A 09/19/2017   Procedure: AV NODE ABLATION;  Surgeon: Deboraha Sprang,  MD;  Location: [place] [facility];  Service: Cardiovascular;  Laterality: N/A;  . BASCILIC VEIN TRANSPOSITION Right 05/12/2019    2ND STAGE RIGHT UPPER ARM BASCILIC VEIN TRANSPOSITION (Right Arm Upper)  . BASCILIC VEIN TRANSPOSITION Right 05/12/2019   Procedure: 2ND STAGE RIGHT UPPER ARM [place];  Surgeon: Waynetta Sandy, MD;  Location: [place];  Service: Vascular;  Laterality: Right;  . BIV PACEMAKER INSERTION CRT-P N/A 09/19/2017   Procedure: BIV PACEMAKER INSERTION CRT-P;  Surgeon: Deboraha Sprang, MD;  Location: [place] [facility];  Service: Cardiovascular;  Laterality: N/A;  . BIV PACEMAKER INSERTION CRT-P N/A 12/28/2018   Procedure: Upgrade to BIV PACEMAKER INSERTION CRT-P;  Surgeon: Evans Lance, MD;  Location: [place] [facility];  Service: Cardiovascular;  Laterality: N/A;  . CARDIAC CATHETERIZATION N/A 08/14/2015   Procedure: Right/Left Heart Cath and Coronary Angiography;  Surgeon: Leonie Man, MD;  Location: [place] [facility];  Service: Cardiovascular;  Laterality: N/A;  . CARDIOVERSION N/A 09/11/2017   Procedure: CARDIOVERSION;  Surgeon: Larey Dresser, MD;  Location: [facility] ENDOSCOPY;  Service: Cardiovascular;  Laterality: N/A;  . CARDIOVERSION N/A 09/14/2017   Procedure: CARDIOVERSION;  Surgeon: Larey Dresser, MD;  Location: [place];  Service: Cardiovascular;  Laterality: N/A;  . CARDIOVERSION N/A 09/18/2017   Procedure: CARDIOVERSION FOR ATRIAL FIBRILLATION;  Surgeon: Larey Dresser, MD;  Location: [facility] ENDOSCOPY;  Service: Cardiovascular;  Laterality: N/A;  . IR FLUORO GUIDE CV LINE RIGHT  02/01/2019  . IR US GUIDE VASC ACCESS RIGHT  02/01/2019  . REPLACEMENT TOTAL HIP W/  RESURFACING IMPLANTS    . REPLACEMENT TOTAL KNEE    . Surgical Repair of a Catheteriztion associated femoral artery injury       Family History  Problem Relation Age of Onset  . Rheumatic fever Mother        in her early 72's  . Alzheimer's disease Mother   . Stomach  cancer Father 5       died 102  . Bradycardia Brother        has pacemaker  . Heart failure Paternal Grandmother   . Heart failure Paternal Grandfather      Social History   Socioeconomic History  . Marital status: Married    Spouse name: Not on file  . Number of children: 0  . Years of education: Not on file  . Highest education level: Not on file  Occupational History  . Not on file  Tobacco Use  . Smoking status: Former Smoker    Packs/day: 1.00    Years: 24.00    Pack years: 24.00    Types: Cigarettes    Start date: 10/29/1958    Quit date: 12/02/1985    Years since  quitting: 34.1  . Smokeless tobacco: Never Used  Vaping Use  . Vaping Use: Never used  Substance and Sexual Activity  . Alcohol use: No    Alcohol/week: 0.0 standard drinks  . Drug use: Never  . Sexual activity: Not on file  Other Topics Concern  . Not on file  Social History Narrative  . Not on file   Social Determinants of Health   Financial Resource Strain:   . Difficulty of Paying Living Expenses: Not on file  Food Insecurity:   . Worried About Charity fundraiser in the Last Year: Not on file  . Ran Out of Food in the Last Year: Not on file  Transportation Needs:   . Lack of Transportation (Medical): Not on file  . Lack of Transportation (Non-Medical): Not on file  Physical Activity:   . Days of Exercise per Week: Not on file  . Minutes of Exercise per Session: Not on file  Stress:   . Feeling of Stress : Not on file  Social Connections:   . Frequency of Communication with Friends and Family: Not on file  . Frequency of Social Gatherings with Friends and Family: Not on file  . Attends Religious Services: Not on file  . Active Member of Clubs or Organizations: Not on file  . Attends Archivist Meetings: Not on file  . Marital Status: Not on file  Intimate Partner Violence:   . Fear of Current or Ex-Partner: Not on file  . Emotionally Abused: Not on file  . Physically  Abused: Not on file  . Sexually Abused: Not on file     BP 104/68   Pulse 72   Ht 5\' 4"  (1.626 m)   SpO2 98%   BMI 34.33 kg/m   Physical Exam:  Chronically ill appearing NAD HEENT: Unremarkable Neck:  6 cm JVD, no thyromegally Lymphatics:  No adenopathy Back:  No CVA tenderness Lungs:  Clear with no wheezes HEART:  Regular rate rhythm, no murmurs, no rubs, no clicks Abd:  soft, positive bowel sounds, no organomegally, no rebound, no guarding Ext:  2 plus pulses, no edema, no cyanosis, no clubbing Skin:  No rashes no nodules Neuro:  CN II through XII intact, motor grossly intact  DEVICE  Normal device function.  See PaceArt for details.   Assess/Plan: 1. Chronic systolic heart failure - she is now sedentary but her symptoms appear to be well controlled now that she is undergoing regular HD and taking her meds as directed. I would not recommend any additional surgeries at this time. 2. CHB - she is asymptomatic, s/p PPM. No plans for biv upgrade at this time. 3. Obesity - she is unable to exercise. Hopefully she can lose some weight eventually 4. ESRD - I encouraged her not to miss any HD episodes.  Carleene Overlie Briseidy Spark,MD

## 2020-01-03 NOTE — Progress Notes (Signed)
PCP: Dr Woody Seller  HF Cardiology: Dr Aundra Dubin  Neprology: Dr Hinda Lenis EP: Dr Caryl Comes   HPI: Gina [name] is a 79 y.o.female with h/o chronic systolic CHF, anemia, HTN, LBBB, renal artery stenosis, and OSA.   Admitted 09/02/2017 with peripheral edema and worsening SOB. Attempts at diuresis were difficult due to AKI. Transferred to [facility] 09/10/17 in setting of cardiogenic shock 2/2 Afib RVR. She required milrinone.  She underwent DCCV 7/11 and 7/14. Discussed with EP after she went back into Afib a third time. Underwent DCCV 7/18 and stayed in for NSR most of the day, but then went back into Afib. Underwent AV nodal ablation and St Jude PPM implantation 09/19/17 with Dr. Caryl Comes. Unable to place an LV lead. She was discharged to SNF. Echo in 7/19 with EF 20%.    Zio patch in 4/20 showed atrial fibrillation with average rate 73, rare PVCs and PACs.   Echo in 6/20 showed EF 20% with diffuse hypokinesis and mild dilation, moderate MR, mildly decreased RV systolic function.   In 11/20, she was admitted with intractable volume overload and markedly high BUN/creatinine. Decision was made to start HD.    In 9/21, she was admitted to hospital in [place] with hypotension and respiratory distress.  She had a PEA arrest with CPR in the setting of hyperkalemia (she had missed a dialysis session).  She had emergent HD and was intubated.  She was found to have Staph aureus PNA.  She gradually recovered and was sent to a rehab facility in [place].    She returns today for followup of CHF.  She is very weak.  She comes in on a stretcher.  She is not walking yet, getting PT and has stood up by the bed.  Says she is short of breath most of the time.  She has been getting regular HD.  She appears to be in atrial fibrillation today.  SBP has been in 100s range.  No lightheadedness.   ECG: Atrial fibrillation with RV pacing  Labs (1/20): K 3.3, creatinine 2.45 Labs (3/20): K 3.2, creatinine 2.64, LFTs normal Labs (6/20):  LFTs, normal, TSH elevated, creatinine 3.87 Labs (12/20): hgb 10.4 Labs (5/21): hgb 10.5 Labs (10/21): hgb 10.9  PMH: 1. OSA: Uses CPAP.  2. Atrial fibrillation: permanent.  Now s/p AV nodal ablation with St Jude PPM.  3. Chronic systolic CHF: Since 8469, nonischemic cardiomyopathy.  - LHC (2017): Normal coronaries.  - Echo (7/19): EF 20% with mild LV dilation and diffuse hypokinesis, mild RV dilation with mild to moderate systolic dysfunction.  - Low output HF in 7/19 requiring milrinone.  - Unable to place LV lead at time of AV nodal ablation and pacing.  - Echo (6/20): EF 20%, mild diffuse hypokinesis, mild dilation, mildly decreased RV systolic function with moderate MR, PASP 47.  4. ESRD 5. H/o LBBB 6. Renal artery stenosis: 80% on left.  7. Frequent PVCs - Zio patch in 4/20: Atrial fibrillation with average rate 73, rare PVCs and PACs.  8. COVID-19 12/20  Review of systems complete and found to be negative unless listed in HPI.   Social History   Socioeconomic History  . Marital status: Married    Spouse name: Not on file  . Number of children: 0  . Years of education: Not on file  . Highest education level: Not on file  Occupational History  . Not on file  Tobacco Use  . Smoking status: Former Smoker    Packs/day: 1.00  Years: 24.00    Pack years: 24.00    Types: Cigarettes    Start date: 10/29/1958    Quit date: 12/02/1985    Years since quitting: 34.1  . Smokeless tobacco: Never Used  Vaping Use  . Vaping Use: Never used  Substance and Sexual Activity  . Alcohol use: No    Alcohol/week: 0.0 standard drinks  . Drug use: Never  . Sexual activity: Not on file  Other Topics Concern  . Not on file  Social History Narrative  . Not on file   Social Determinants of Health   Financial Resource Strain:   . Difficulty of Paying Living Expenses: Not on file  Food Insecurity:   . Worried About Charity fundraiser in the Last Year: Not on file  . Ran Out of  Food in the Last Year: Not on file  Transportation Needs:   . Lack of Transportation (Medical): Not on file  . Lack of Transportation (Non-Medical): Not on file  Physical Activity:   . Days of Exercise per Week: Not on file  . Minutes of Exercise per Session: Not on file  Stress:   . Feeling of Stress : Not on file  Social Connections:   . Frequency of Communication with Friends and Family: Not on file  . Frequency of Social Gatherings with Friends and Family: Not on file  . Attends Religious Services: Not on file  . Active Member of Clubs or Organizations: Not on file  . Attends Archivist Meetings: Not on file  . Marital Status: Not on file  Intimate Partner Violence:   . Fear of Current or Ex-Partner: Not on file  . Emotionally Abused: Not on file  . Physically Abused: Not on file  . Sexually Abused: Not on file    Family History  Problem Relation Age of Onset  . Rheumatic fever Mother        in her early 52's  . Alzheimer's disease Mother   . Stomach cancer Father 31       died 20  . Bradycardia Brother        has pacemaker  . Heart failure Paternal Grandmother   . Heart failure Paternal Grandfather     Current Outpatient Medications  Medication Sig Dispense Refill  . acetaminophen (TYLENOL) 500 MG tablet Take 1,000 mg by mouth at bedtime.    [name] amiodarone (PACERONE) 200 MG tablet TAKE 1 TABLET(200 MG) BY MOUTH DAILY 30 tablet 11  . apixaban (ELIQUIS) 5 MG TABS tablet Take 1 tablet (5 mg total) by mouth 2 (two) times daily. 180 tablet 3  . escitalopram (LEXAPRO) 5 MG tablet Take 5 mg by mouth daily.    [name] levothyroxine (SYNTHROID) 75 MCG tablet Take 75 mcg by mouth daily before breakfast.    . lidocaine-prilocaine (EMLA) cream APPLY TO ACCESS SITE 30 MINUTES PRIOR TO DIALYSIS AND WRAP WITH SARAN WRAP    . loperamide (IMODIUM) 1 MG/5ML solution Take 1 mg by mouth as needed for diarrhea or loose stools.    . sevelamer carbonate (RENVELA) 800 MG tablet Take 800  mg by mouth 3 (three) times daily with meals.    . sodium chloride (OCEAN) 0.65 % SOLN nasal spray Place 1 spray into both nostrils 4 (four) times daily as needed for congestion.     No current facility-administered medications for this encounter.    Vitals:   01/03/20 1626  BP: 102/60  Pulse: 72  SpO2: 100%   Wt  Readings from Last 3 Encounters:  06/07/19 90.7 kg (200 lb)  05/12/19 91 kg (200 lb 9.9 oz)  04/09/19 93.4 kg (206 lb)   PHYSICAL EXAM: General: Frail Neck: Thick. No JVD, no thyromegaly or thyroid nodule.  Lungs: Clear to auscultation bilaterally with normal respiratory effort. CV: Nondisplaced PMI.  Heart regular S1/S2, no S3/S4, no murmur.  No peripheral edema.  No carotid bruit.  Normal pedal pulses.  Abdomen: Soft, nontender, no hepatosplenomegaly, no distention.  Skin: Intact without lesions or rashes.  Neurologic: Alert and oriented x 3.  Psych: Normal affect. Extremities: No clubbing or cyanosis.  HEENT: Normal.   ASSESSMENT & PLAN: 1. Chronic Systolic CHF: Echo (5/18) with EF 20%. Long-standing cardiomyopathy, nonischemic based on cath in 2017. 7/19 hospitalization withlow output HF likely triggered by atrial fibrillation/RVR.  She was started on milrinone.  DCCVs failed and she ultimately had AV nodal ablation with St Jude PPM placed (unable to place LV lead).  Echo in 6/20 showed EF 20%.  Though she reports dyspnea, volume status looks ok on exam.   - Continue HD for volume control.  - She is off Coreg with low BP.  With SBP in 100s, will not restart.  - She RV paces chronically. Plan had been to eventually re-attempt LV lead, but she is not stable enough at this time.  2. ESRD: Do not miss HD.   3. Atrial fibrillation: Chronic.  She is now s/p AV nodal ablation with St Jude PPM.  - Continue Eliquis 5 mg twice a day.  5. OSA - Continue CPAP qHS. 5. PVCs: She is on amiodarone for frequent PVCs (?contributing to cardiomyopathy).  Zio patch in 4/20 on  amiodarone showed only rare PVCs.  - Continue amiodarone 200 mg daily.  Check LFTs and TSH.  She will need a regular eye exam.   Followup 4 months.   [name]   01/03/2020

## 2020-01-03 NOTE — Patient Instructions (Signed)
Following orders sent back with patient to [facility] in [place], [place], phone # [phone] fax # [phone]:   Continue current medications  Labs done in our office today: tsh, liver panel  F/u in 4 months: Tue 05/09/20 at 2:20 PM

## 2020-01-03 NOTE — Patient Instructions (Addendum)
Medication Instructions:  Your physician recommends that you continue on your current medications as directed. Please refer to the Current Medication list given to you today.  Labwork: None ordered.  Testing/Procedures: None ordered.  Follow-Up: Your physician wants you to follow-up in: 6 months with Dr. Lovena Le.   You will receive a reminder letter in the mail two months in advance. If you don't receive a letter, please call our office to schedule the follow-up appointment.  Remote monitoring is used to monitor your Pacemaker from home. This monitoring reduces the number of office visits required to check your device to one time per year. It allows [place] to keep an eye on the functioning of your device to ensure it is working properly. You are scheduled for a device check from home on 03/17/2020. You may send your transmission at any time that day. If you have a wireless device, the transmission will be sent automatically. After your physician reviews your transmission, you will receive a postcard with your next transmission date.  Any Other Special Instructions Will Be Listed Below (If Applicable).  If you need a refill on your cardiac medications before your next appointment, please call your pharmacy.

## 2020-01-04 DIAGNOSIS — I509 Heart failure, unspecified: Secondary | ICD-10-CM | POA: Diagnosis not present

## 2020-01-04 DIAGNOSIS — R1310 Dysphagia, unspecified: Secondary | ICD-10-CM | POA: Diagnosis not present

## 2020-01-04 DIAGNOSIS — S51812A Laceration without foreign body of left forearm, initial encounter: Secondary | ICD-10-CM | POA: Diagnosis not present

## 2020-01-05 DIAGNOSIS — I4891 Unspecified atrial fibrillation: Secondary | ICD-10-CM | POA: Diagnosis not present

## 2020-01-05 DIAGNOSIS — I340 Nonrheumatic mitral (valve) insufficiency: Secondary | ICD-10-CM | POA: Diagnosis not present

## 2020-01-05 DIAGNOSIS — N186 End stage renal disease: Secondary | ICD-10-CM | POA: Diagnosis not present

## 2020-01-05 DIAGNOSIS — I2729 Other secondary pulmonary hypertension: Secondary | ICD-10-CM | POA: Diagnosis not present

## 2020-01-05 DIAGNOSIS — I517 Cardiomegaly: Secondary | ICD-10-CM | POA: Diagnosis not present

## 2020-01-05 DIAGNOSIS — E039 Hypothyroidism, unspecified: Secondary | ICD-10-CM | POA: Diagnosis not present

## 2020-01-05 DIAGNOSIS — Z992 Dependence on renal dialysis: Secondary | ICD-10-CM | POA: Diagnosis not present

## 2020-01-05 DIAGNOSIS — I501 Left ventricular failure: Secondary | ICD-10-CM | POA: Diagnosis not present

## 2020-01-05 LAB — CUP PACEART INCLINIC DEVICE CHECK
Battery Remaining Longevity: 108 mo
Battery Voltage: 2.99 V
Brady Statistic RA Percent Paced: 5.1 %
Brady Statistic RV Percent Paced: 99.94 %
Date Time Interrogation Session: 20211101171202
Implantable Lead Implant Date: 20190719
Implantable Lead Implant Date: 20190719
Implantable Lead Location: 753859
Implantable Lead Location: 753860
Implantable Lead Model: 5076
Implantable Lead Model: 5076
Implantable Pulse Generator Implant Date: 20190719
Lead Channel Impedance Value: 350 Ohm
Lead Channel Impedance Value: 537.5 Ohm
Lead Channel Pacing Threshold Amplitude: 0.5 V
Lead Channel Pacing Threshold Amplitude: 0.5 V
Lead Channel Pacing Threshold Pulse Width: 0.5 ms
Lead Channel Pacing Threshold Pulse Width: 0.5 ms
Lead Channel Sensing Intrinsic Amplitude: 0.4 mV
Lead Channel Sensing Intrinsic Amplitude: 7.6 mV
Lead Channel Setting Pacing Amplitude: 2.5 V
Lead Channel Setting Pacing Amplitude: 2.5 V
Lead Channel Setting Pacing Pulse Width: 0.5 ms
Lead Channel Setting Sensing Sensitivity: 4 mV
Pulse Gen Model: 3222
Pulse Gen Serial Number: 9022392

## 2020-01-06 DIAGNOSIS — L89313 Pressure ulcer of right buttock, stage 3: Secondary | ICD-10-CM | POA: Diagnosis not present

## 2020-01-10 DIAGNOSIS — L89313 Pressure ulcer of right buttock, stage 3: Secondary | ICD-10-CM | POA: Diagnosis not present

## 2020-01-12 DIAGNOSIS — F419 Anxiety disorder, unspecified: Secondary | ICD-10-CM | POA: Diagnosis not present

## 2020-01-12 DIAGNOSIS — F321 Major depressive disorder, single episode, moderate: Secondary | ICD-10-CM | POA: Diagnosis not present

## 2020-01-17 ENCOUNTER — Telehealth (HOSPITAL_COMMUNITY): Payer: Self-pay | Admitting: Cardiology

## 2020-01-17 NOTE — Telephone Encounter (Signed)
Abnormal ;labs received  TSH 7.375 Per Amy Clegg.NP Increase levothyroxine to 100 mcg daily Repeat TSH in 4 weeks PCP to follow and manage thereafter  Orders faxed to rehab [phone]

## 2020-01-18 ENCOUNTER — Other Ambulatory Visit (HOSPITAL_COMMUNITY): Payer: Self-pay | Admitting: Cardiology

## 2020-01-24 DIAGNOSIS — L89302 Pressure ulcer of unspecified buttock, stage 2: Secondary | ICD-10-CM | POA: Diagnosis not present

## 2020-01-31 DIAGNOSIS — L89302 Pressure ulcer of unspecified buttock, stage 2: Secondary | ICD-10-CM | POA: Diagnosis not present

## 2020-02-01 DIAGNOSIS — B9721 SARS-associated coronavirus as the cause of diseases classified elsewhere: Secondary | ICD-10-CM | POA: Diagnosis not present

## 2020-02-01 DIAGNOSIS — I517 Cardiomegaly: Secondary | ICD-10-CM | POA: Diagnosis not present

## 2020-02-01 DIAGNOSIS — B9789 Other viral agents as the cause of diseases classified elsewhere: Secondary | ICD-10-CM | POA: Diagnosis not present

## 2020-02-01 DIAGNOSIS — I509 Heart failure, unspecified: Secondary | ICD-10-CM | POA: Diagnosis not present

## 2020-02-01 DIAGNOSIS — U071 COVID-19: Secondary | ICD-10-CM | POA: Diagnosis not present

## 2020-02-01 DIAGNOSIS — R059 Cough, unspecified: Secondary | ICD-10-CM | POA: Diagnosis not present

## 2020-02-01 DIAGNOSIS — J209 Acute bronchitis, unspecified: Secondary | ICD-10-CM | POA: Diagnosis not present

## 2020-02-03 DIAGNOSIS — R7989 Other specified abnormal findings of blood chemistry: Secondary | ICD-10-CM | POA: Diagnosis not present

## 2020-02-03 DIAGNOSIS — J9622 Acute and chronic respiratory failure with hypercapnia: Secondary | ICD-10-CM | POA: Diagnosis not present

## 2020-02-03 DIAGNOSIS — I509 Heart failure, unspecified: Secondary | ICD-10-CM | POA: Diagnosis not present

## 2020-02-07 DIAGNOSIS — L89302 Pressure ulcer of unspecified buttock, stage 2: Secondary | ICD-10-CM | POA: Diagnosis not present

## 2020-02-14 DIAGNOSIS — F339 Major depressive disorder, recurrent, unspecified: Secondary | ICD-10-CM | POA: Diagnosis not present

## 2020-02-14 DIAGNOSIS — I482 Chronic atrial fibrillation, unspecified: Secondary | ICD-10-CM | POA: Diagnosis not present

## 2020-02-14 DIAGNOSIS — N186 End stage renal disease: Secondary | ICD-10-CM | POA: Diagnosis not present

## 2020-02-14 DIAGNOSIS — I509 Heart failure, unspecified: Secondary | ICD-10-CM | POA: Diagnosis not present

## 2020-03-04 DEATH — deceased

## 2020-05-09 ENCOUNTER — Encounter (HOSPITAL_COMMUNITY): Payer: Medicare Other | Admitting: Cardiology

## 2020-11-26 IMAGING — CT CT HEAD W/O CM
4 series · 16 of 47 positions shown, 18 images · non-contrast
Comparison: None.

CLINICAL DATA: Weakness of the right upper extremity.

EXAM:
CT HEAD WITHOUT CONTRAST
TECHNIQUE: Contiguous axial images were obtained from the base of the skull
through the vertex without intravenous contrast.

[Series 3: head without · axial · non-contrast · 0.45mm/px · z∈[-92,+28]mm · 7 of 33 slices shown, 9 images]
[im 5/33  brain]
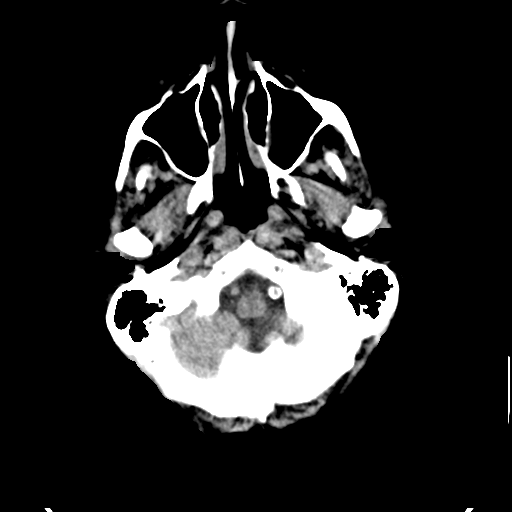
[im 5/33  bone]
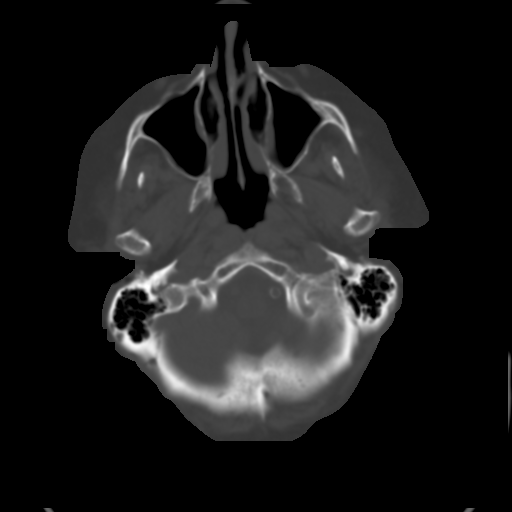
[im 9/33  brain]
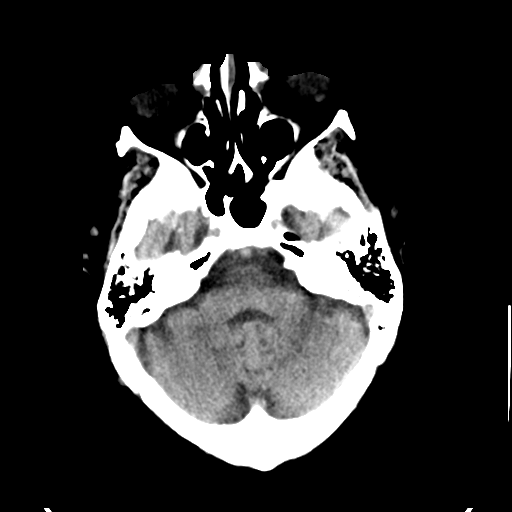
[im 13/33  brain]
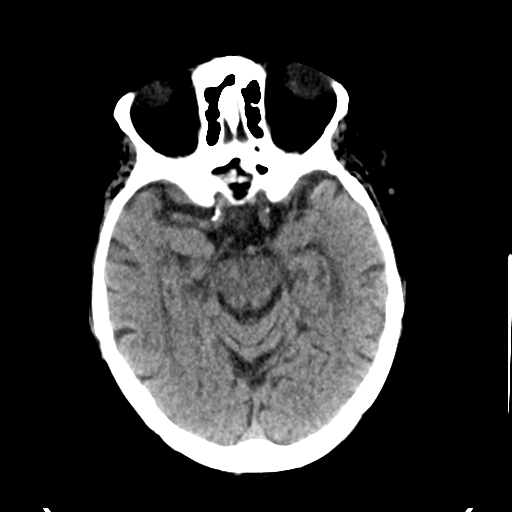
[im 17/33  brain]
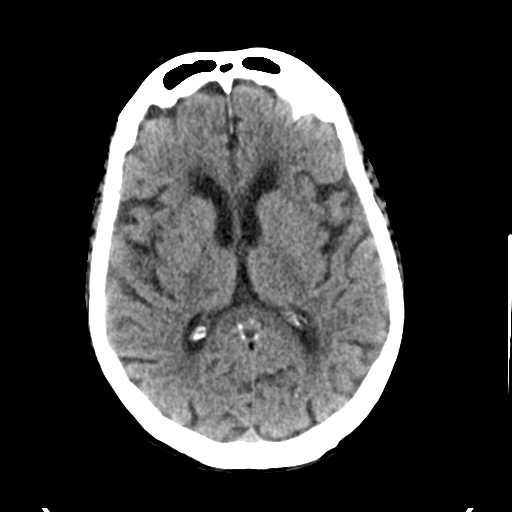
[im 21/33  brain]
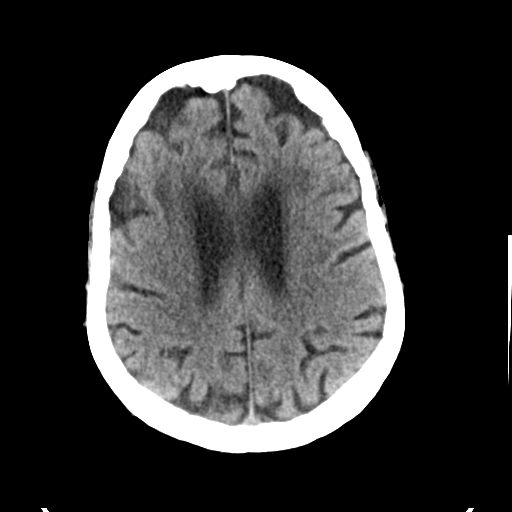
[im 21/33  bone]
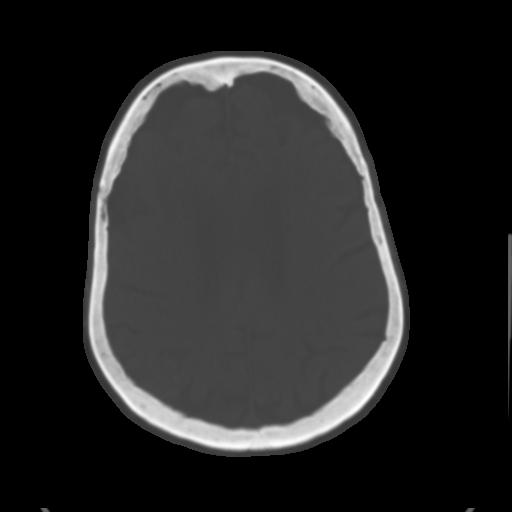
[im 25/33  brain]
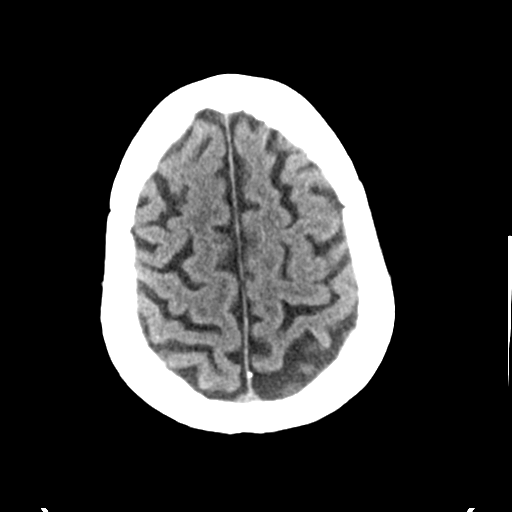
[im 29/33  brain]
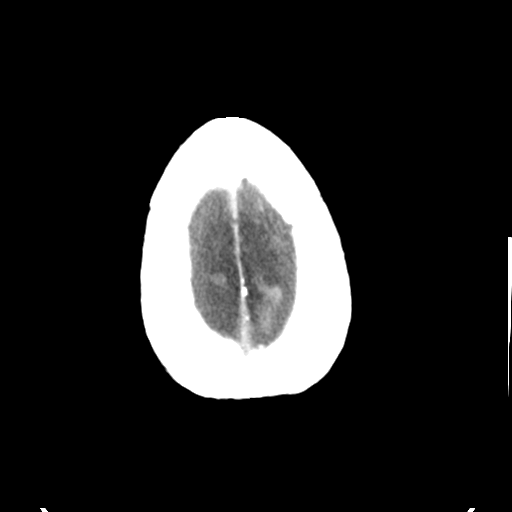

[Series 4: head bone · axial · 0.45mm/px · z∈[-96,-64]mm · 3 of 82 slices shown]
[im 9/82  bone]
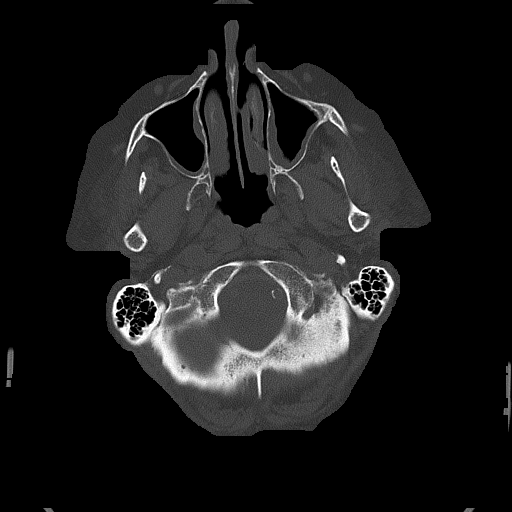
[im 17/82  bone]
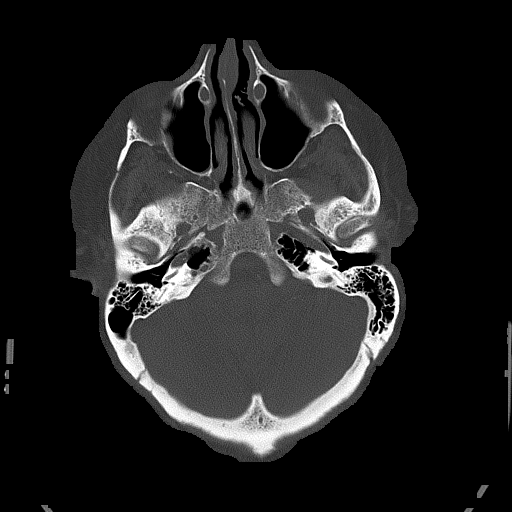
[im 25/82  bone]
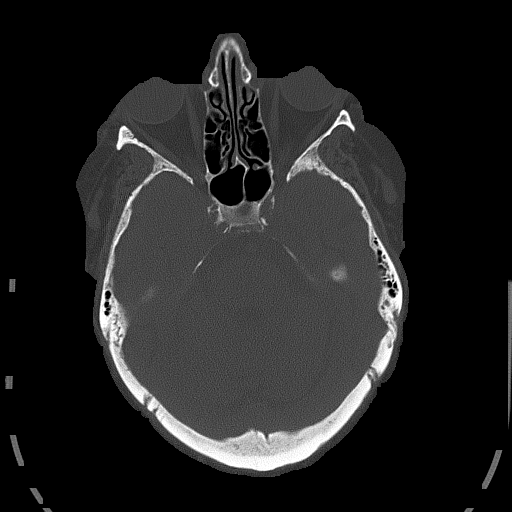

[Series 5: head without cor · coronal · non-contrast · 0.32mm/px · 3 of 69 slices shown]
[im 25/69  brain]
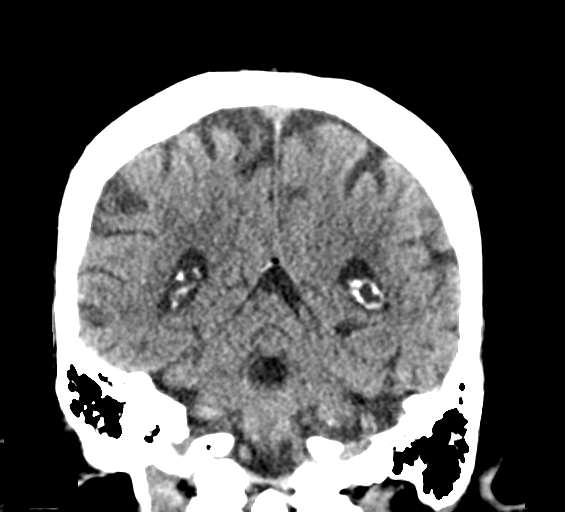
[im 31/69  brain]
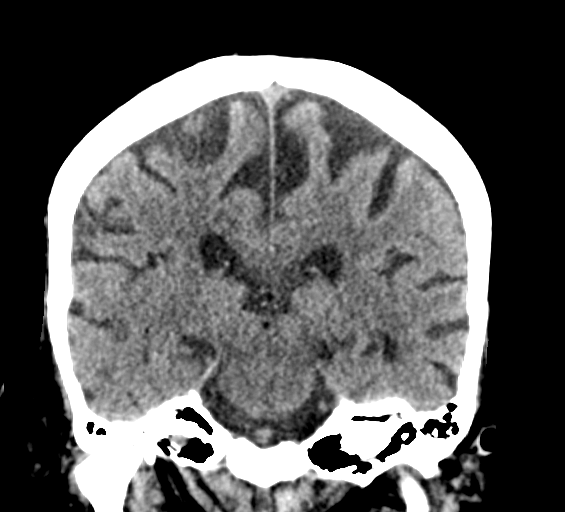
[im 38/69  brain]
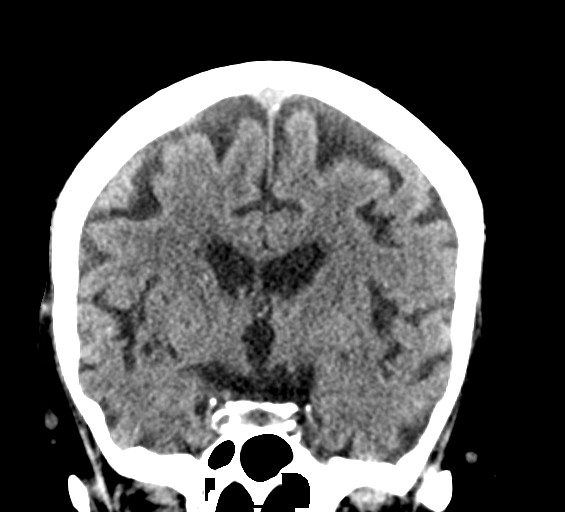

[Series 6: head without sag · sagittal · non-contrast · 0.33mm/px · 3 of 51 slices shown]
[im 17/51  brain]
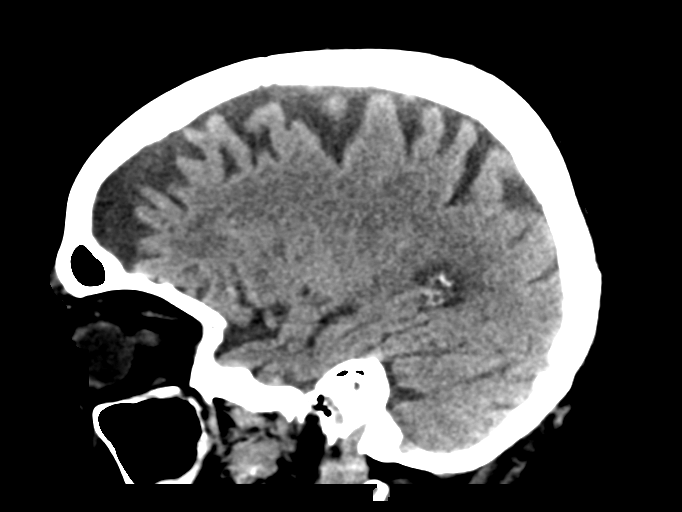
[im 26/51  brain]
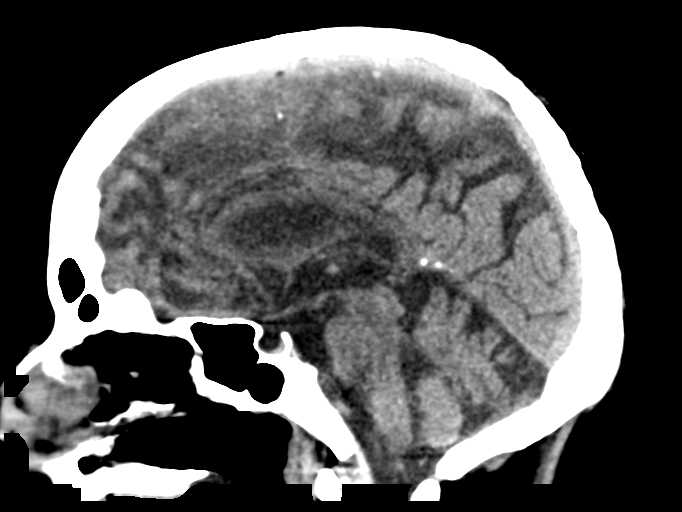
[im 34/51  brain]
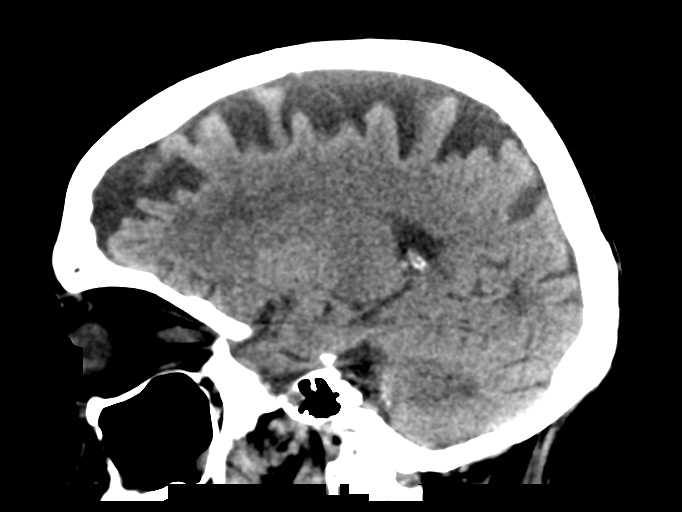

[16 of 47 positions shown; findings below may reference images not displayed]

FINDINGS: Brain: No evidence of acute infarction, hemorrhage, hydrocephalus,
extra-axial collection or mass lesion/mass effect. There is chronic
diffuse atrophy. Chronic bilateral periventricular white matter
small vessel ischemic changes are noted.

Vascular: No hyperdense vessel is noted.

Skull: Normal. Negative for fracture or focal lesion.

Sinuses/Orbits: No acute finding.

Other: None.
IMPRESSION: No focal acute intracranial abnormality identified. Chronic diffuse
atrophy. Chronic bilateral periventricular white matter small vessel
ischemic change.
